# Patient Record
Sex: Female | Born: 1940 | Race: Black or African American | Hispanic: No | Smoking: Never smoker
Health system: Southern US, Community
[De-identification: ages and names within clinical notes are randomized; demographics above are authoritative.]

## PROBLEM LIST (undated history)

## (undated) DIAGNOSIS — F419 Anxiety disorder, unspecified: Secondary | ICD-10-CM

## (undated) DIAGNOSIS — F32A Depression, unspecified: Secondary | ICD-10-CM

## (undated) DIAGNOSIS — I10 Essential (primary) hypertension: Secondary | ICD-10-CM

## (undated) DIAGNOSIS — F329 Major depressive disorder, single episode, unspecified: Secondary | ICD-10-CM

## (undated) DIAGNOSIS — K5900 Constipation, unspecified: Secondary | ICD-10-CM

## (undated) DIAGNOSIS — E119 Type 2 diabetes mellitus without complications: Secondary | ICD-10-CM

## (undated) DIAGNOSIS — H9319 Tinnitus, unspecified ear: Secondary | ICD-10-CM

## (undated) DIAGNOSIS — M25569 Pain in unspecified knee: Secondary | ICD-10-CM

## (undated) HISTORY — PX: OTHER SURGICAL HISTORY: SHX169

## (undated) HISTORY — DX: Major depressive disorder, single episode, unspecified: F32.9

## (undated) HISTORY — DX: Constipation, unspecified: K59.00

## (undated) HISTORY — DX: Tinnitus, unspecified ear: H93.19

## (undated) HISTORY — PX: ABDOMINAL HYSTERECTOMY: SHX81

## (undated) HISTORY — DX: Depression, unspecified: F32.A

---
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
## 2006-10-25 ENCOUNTER — Ambulatory Visit: Payer: Self-pay | Admitting: Internal Medicine

## 2006-11-10 ENCOUNTER — Ambulatory Visit (HOSPITAL_COMMUNITY): Admission: RE | Admit: 2006-11-10 | Discharge: 2006-11-10 | Payer: Self-pay | Admitting: Internal Medicine

## 2007-01-09 ENCOUNTER — Ambulatory Visit: Payer: Self-pay | Admitting: Family Medicine

## 2007-01-27 DIAGNOSIS — R7309 Other abnormal glucose: Secondary | ICD-10-CM | POA: Insufficient documentation

## 2007-02-01 ENCOUNTER — Ambulatory Visit: Payer: Self-pay | Admitting: Internal Medicine

## 2007-02-09 ENCOUNTER — Ambulatory Visit: Payer: Self-pay | Admitting: Internal Medicine

## 2007-02-14 ENCOUNTER — Ambulatory Visit (HOSPITAL_COMMUNITY): Admission: RE | Admit: 2007-02-14 | Discharge: 2007-02-14 | Payer: Self-pay | Admitting: Internal Medicine

## 2007-03-06 ENCOUNTER — Ambulatory Visit: Payer: Self-pay | Admitting: Internal Medicine

## 2007-03-14 ENCOUNTER — Ambulatory Visit: Payer: Self-pay | Admitting: Internal Medicine

## 2007-04-17 ENCOUNTER — Ambulatory Visit: Payer: Self-pay | Admitting: Family Medicine

## 2007-11-12 ENCOUNTER — Emergency Department (HOSPITAL_COMMUNITY): Admission: EM | Admit: 2007-11-12 | Discharge: 2007-11-12 | Payer: Self-pay | Admitting: Family Medicine

## 2007-12-16 ENCOUNTER — Emergency Department (HOSPITAL_COMMUNITY): Admission: EM | Admit: 2007-12-16 | Discharge: 2007-12-16 | Payer: Self-pay | Admitting: Emergency Medicine

## 2008-01-22 ENCOUNTER — Ambulatory Visit: Payer: Self-pay | Admitting: Family Medicine

## 2008-01-22 DIAGNOSIS — M545 Low back pain: Secondary | ICD-10-CM

## 2008-01-24 DIAGNOSIS — I10 Essential (primary) hypertension: Secondary | ICD-10-CM | POA: Insufficient documentation

## 2008-02-10 ENCOUNTER — Emergency Department (HOSPITAL_COMMUNITY): Admission: EM | Admit: 2008-02-10 | Discharge: 2008-02-10 | Payer: Self-pay | Admitting: Emergency Medicine

## 2008-02-18 ENCOUNTER — Ambulatory Visit (HOSPITAL_COMMUNITY): Admission: RE | Admit: 2008-02-18 | Discharge: 2008-02-18 | Payer: Self-pay | Admitting: Internal Medicine

## 2008-02-21 ENCOUNTER — Encounter (INDEPENDENT_AMBULATORY_CARE_PROVIDER_SITE_OTHER): Payer: Self-pay | Admitting: Internal Medicine

## 2008-03-31 ENCOUNTER — Emergency Department (HOSPITAL_COMMUNITY): Admission: EM | Admit: 2008-03-31 | Discharge: 2008-03-31 | Payer: Self-pay | Admitting: Family Medicine

## 2008-04-16 ENCOUNTER — Ambulatory Visit: Payer: Self-pay | Admitting: Family Medicine

## 2008-04-16 ENCOUNTER — Telehealth (INDEPENDENT_AMBULATORY_CARE_PROVIDER_SITE_OTHER): Payer: Self-pay | Admitting: *Deleted

## 2008-04-16 DIAGNOSIS — R5381 Other malaise: Secondary | ICD-10-CM | POA: Insufficient documentation

## 2008-04-16 DIAGNOSIS — R5383 Other fatigue: Secondary | ICD-10-CM

## 2008-06-06 ENCOUNTER — Ambulatory Visit: Payer: Self-pay | Admitting: Internal Medicine

## 2008-06-20 ENCOUNTER — Ambulatory Visit: Payer: Self-pay | Admitting: Internal Medicine

## 2008-06-20 LAB — CONVERTED CEMR LAB
CO2: 25 meq/L (ref 19–32)
Sodium: 138 meq/L (ref 135–145)

## 2008-07-17 ENCOUNTER — Ambulatory Visit: Payer: Self-pay | Admitting: Internal Medicine

## 2008-07-17 ENCOUNTER — Ambulatory Visit: Payer: Self-pay | Admitting: *Deleted

## 2008-07-17 DIAGNOSIS — G4700 Insomnia, unspecified: Secondary | ICD-10-CM

## 2008-07-17 LAB — CONVERTED CEMR LAB
Bilirubin Urine: NEGATIVE
Blood in Urine, dipstick: NEGATIVE
Nitrite: NEGATIVE
Urobilinogen, UA: 0.2
WBC Urine, dipstick: NEGATIVE

## 2008-07-24 ENCOUNTER — Ambulatory Visit: Payer: Self-pay | Admitting: Internal Medicine

## 2008-07-24 LAB — CONVERTED CEMR LAB
ALT: 23 units/L (ref 0–35)
Albumin: 4.3 g/dL (ref 3.5–5.2)
Alkaline Phosphatase: 87 units/L (ref 39–117)
BUN: 14 mg/dL (ref 6–23)
Basophils Absolute: 0 10*3/uL (ref 0.0–0.1)
Basophils Relative: 1 % (ref 0–1)
CO2: 27 meq/L (ref 19–32)
Calcium: 9.5 mg/dL (ref 8.4–10.5)
Cholesterol: 193 mg/dL (ref 0–200)
Eosinophils Absolute: 0.2 10*3/uL (ref 0.0–0.7)
HCT: 41.6 % (ref 36.0–46.0)
HDL: 51 mg/dL (ref >39)
Hemoglobin: 13 g/dL (ref 12.0–15.0)
LDL Cholesterol: 127 mg/dL — ABNORMAL HIGH (ref 0–99)
Lymphocytes Relative: 31 % (ref 12–46)
Lymphs Abs: 1.9 10*3/uL (ref 0.7–4.0)
MCHC: 31.3 g/dL (ref 30.0–36.0)
Monocytes Relative: 6 % (ref 3–12)
Platelets: 279 10*3/uL (ref 150–400)
RDW: 13.8 % (ref 11.5–15.5)
Sodium: 139 meq/L (ref 135–145)
Total Bilirubin: 0.8 mg/dL (ref 0.3–1.2)
Total CHOL/HDL Ratio: 3.8
Triglycerides: 74 mg/dL (ref <150)
WBC: 6.2 10*3/uL (ref 4.0–10.5)

## 2008-08-04 ENCOUNTER — Emergency Department (HOSPITAL_COMMUNITY): Admission: EM | Admit: 2008-08-04 | Discharge: 2008-08-04 | Payer: Self-pay | Admitting: Family Medicine

## 2008-08-06 ENCOUNTER — Encounter (INDEPENDENT_AMBULATORY_CARE_PROVIDER_SITE_OTHER): Payer: Self-pay | Admitting: Internal Medicine

## 2008-08-17 ENCOUNTER — Emergency Department (HOSPITAL_COMMUNITY): Admission: EM | Admit: 2008-08-17 | Discharge: 2008-08-17 | Payer: Self-pay | Admitting: Family Medicine

## 2008-10-05 ENCOUNTER — Emergency Department (HOSPITAL_COMMUNITY): Admission: EM | Admit: 2008-10-05 | Discharge: 2008-10-05 | Payer: Self-pay | Admitting: Emergency Medicine

## 2008-12-15 ENCOUNTER — Telehealth (INDEPENDENT_AMBULATORY_CARE_PROVIDER_SITE_OTHER): Payer: Self-pay | Admitting: Internal Medicine

## 2009-02-13 ENCOUNTER — Ambulatory Visit: Payer: Self-pay | Admitting: Nurse Practitioner

## 2009-02-13 DIAGNOSIS — R05 Cough: Secondary | ICD-10-CM

## 2009-02-13 DIAGNOSIS — G8929 Other chronic pain: Secondary | ICD-10-CM | POA: Insufficient documentation

## 2009-02-13 DIAGNOSIS — M25569 Pain in unspecified knee: Secondary | ICD-10-CM

## 2009-02-13 DIAGNOSIS — R059 Cough, unspecified: Secondary | ICD-10-CM | POA: Insufficient documentation

## 2009-02-13 HISTORY — DX: Pain in unspecified knee: M25.569

## 2009-02-16 LAB — CONVERTED CEMR LAB
Albumin: 4 g/dL (ref 3.5–5.2)
Basophils Absolute: 0 10*3/uL (ref 0.0–0.1)
CO2: 23 meq/L (ref 19–32)
Creatinine, Ser: 0.77 mg/dL (ref 0.40–1.20)
MCHC: 32.1 g/dL (ref 30.0–36.0)
MCV: 86.1 fL (ref 78.0–100.0)
Monocytes Absolute: 1.1 10*3/uL — ABNORMAL HIGH (ref 0.1–1.0)
Neutro Abs: 9.5 10*3/uL — ABNORMAL HIGH (ref 1.7–7.7)
Neutrophils Relative %: 73 % (ref 43–77)
Platelets: 277 10*3/uL (ref 150–400)
Potassium: 4.7 meq/L (ref 3.5–5.3)
RBC: 4.45 M/uL (ref 3.87–5.11)
RDW: 13.7 % (ref 11.5–15.5)
Total Bilirubin: 0.6 mg/dL (ref 0.3–1.2)
Total Protein: 7.4 g/dL (ref 6.0–8.3)

## 2009-03-20 ENCOUNTER — Ambulatory Visit (HOSPITAL_COMMUNITY): Admission: RE | Admit: 2009-03-20 | Discharge: 2009-03-20 | Payer: Self-pay | Admitting: Internal Medicine

## 2009-05-18 ENCOUNTER — Ambulatory Visit: Payer: Self-pay | Admitting: Family Medicine

## 2009-08-11 ENCOUNTER — Telehealth (INDEPENDENT_AMBULATORY_CARE_PROVIDER_SITE_OTHER): Payer: Self-pay | Admitting: *Deleted

## 2009-08-11 DIAGNOSIS — J309 Allergic rhinitis, unspecified: Secondary | ICD-10-CM | POA: Insufficient documentation

## 2009-08-17 ENCOUNTER — Emergency Department (HOSPITAL_COMMUNITY): Admission: EM | Admit: 2009-08-17 | Discharge: 2009-08-17 | Payer: Self-pay | Admitting: Family Medicine

## 2009-11-10 ENCOUNTER — Emergency Department (HOSPITAL_COMMUNITY): Admission: EM | Admit: 2009-11-10 | Discharge: 2009-11-10 | Payer: Self-pay | Admitting: Emergency Medicine

## 2009-11-18 ENCOUNTER — Ambulatory Visit: Payer: Self-pay | Admitting: Internal Medicine

## 2010-03-22 ENCOUNTER — Ambulatory Visit (HOSPITAL_COMMUNITY): Admission: RE | Admit: 2010-03-22 | Discharge: 2010-03-22 | Payer: Self-pay | Admitting: Internal Medicine

## 2010-06-28 ENCOUNTER — Emergency Department (HOSPITAL_COMMUNITY): Admission: EM | Admit: 2010-06-28 | Discharge: 2010-06-28 | Payer: Self-pay | Admitting: Emergency Medicine

## 2010-11-27 ENCOUNTER — Emergency Department (HOSPITAL_COMMUNITY)
Admission: EM | Admit: 2010-11-27 | Discharge: 2010-11-27 | Payer: Self-pay | Source: Home / Self Care | Admitting: Family Medicine

## 2010-12-19 ENCOUNTER — Encounter: Payer: Self-pay | Admitting: Internal Medicine

## 2011-02-04 ENCOUNTER — Emergency Department (HOSPITAL_COMMUNITY)
Admission: EM | Admit: 2011-02-04 | Discharge: 2011-02-04 | Disposition: A | Discharge status: Home / Self Care | Payer: Medicare Other | Attending: Emergency Medicine | Admitting: Emergency Medicine

## 2011-02-04 ENCOUNTER — Emergency Department (HOSPITAL_COMMUNITY): Payer: Medicare Other

## 2011-02-04 DIAGNOSIS — IMO0001 Reserved for inherently not codable concepts without codable children: Secondary | ICD-10-CM | POA: Insufficient documentation

## 2011-02-04 DIAGNOSIS — M2550 Pain in unspecified joint: Secondary | ICD-10-CM | POA: Insufficient documentation

## 2011-02-04 DIAGNOSIS — R5381 Other malaise: Secondary | ICD-10-CM | POA: Insufficient documentation

## 2011-02-04 DIAGNOSIS — R059 Cough, unspecified: Secondary | ICD-10-CM | POA: Insufficient documentation

## 2011-02-04 DIAGNOSIS — R05 Cough: Secondary | ICD-10-CM | POA: Insufficient documentation

## 2011-02-04 DIAGNOSIS — Z7982 Long term (current) use of aspirin: Secondary | ICD-10-CM | POA: Insufficient documentation

## 2011-02-04 DIAGNOSIS — M542 Cervicalgia: Secondary | ICD-10-CM | POA: Insufficient documentation

## 2011-02-04 DIAGNOSIS — Z79899 Other long term (current) drug therapy: Secondary | ICD-10-CM | POA: Insufficient documentation

## 2011-02-04 DIAGNOSIS — I10 Essential (primary) hypertension: Secondary | ICD-10-CM | POA: Insufficient documentation

## 2011-02-04 DIAGNOSIS — M549 Dorsalgia, unspecified: Secondary | ICD-10-CM | POA: Insufficient documentation

## 2011-02-04 DIAGNOSIS — G8929 Other chronic pain: Secondary | ICD-10-CM | POA: Insufficient documentation

## 2011-02-04 DIAGNOSIS — R079 Chest pain, unspecified: Secondary | ICD-10-CM | POA: Insufficient documentation

## 2011-02-04 LAB — URINALYSIS, ROUTINE W REFLEX MICROSCOPIC
Bilirubin Urine: NEGATIVE
Ketones, ur: NEGATIVE mg/dL
Nitrite: NEGATIVE
Protein, ur: NEGATIVE mg/dL
Urobilinogen, UA: 0.2 mg/dL (ref 0.0–1.0)

## 2011-02-04 LAB — CBC
HCT: 41 % (ref 36.0–46.0)
MCH: 27.7 pg (ref 26.0–34.0)
MCV: 84.7 fL (ref 78.0–100.0)
RDW: 13.1 % (ref 11.5–15.5)
WBC: 9.5 10*3/uL (ref 4.0–10.5)

## 2011-02-04 LAB — BASIC METABOLIC PANEL
BUN: 17 mg/dL (ref 6–23)
Calcium: 8.9 mg/dL (ref 8.4–10.5)
Creatinine, Ser: 0.72 mg/dL (ref 0.4–1.2)
GFR calc non Af Amer: >60 mL/min (ref >60)
Glucose, Bld: 152 mg/dL — ABNORMAL HIGH (ref 70–99)
Potassium: 4.1 mEq/L (ref 3.5–5.1)
Sodium: 134 mEq/L — ABNORMAL LOW (ref 135–145)

## 2011-02-04 LAB — DIFFERENTIAL
Basophils Absolute: 0 10*3/uL (ref 0.0–0.1)
Eosinophils Relative: 2 % (ref 0–5)
Lymphocytes Relative: 21 % (ref 12–46)
Lymphs Abs: 2 10*3/uL (ref 0.7–4.0)
Neutrophils Relative %: 71 % (ref 43–77)

## 2011-02-04 LAB — URINE MICROSCOPIC-ADD ON

## 2011-02-11 LAB — COMPREHENSIVE METABOLIC PANEL
ALT: 25 U/L (ref 0–35)
AST: 24 U/L (ref 0–37)
Alkaline Phosphatase: 85 U/L (ref 39–117)
Calcium: 8.8 mg/dL (ref 8.4–10.5)
GFR calc Af Amer: >60 mL/min (ref >60)
GFR calc non Af Amer: >60 mL/min (ref >60)
Potassium: 4.1 mEq/L (ref 3.5–5.1)
Total Bilirubin: 1 mg/dL (ref 0.3–1.2)

## 2011-02-11 LAB — DIFFERENTIAL
Basophils Absolute: 0.1 10*3/uL (ref 0.0–0.1)
Eosinophils Relative: 4 % (ref 0–5)
Lymphocytes Relative: 30 % (ref 12–46)
Neutrophils Relative %: 59 % (ref 43–77)

## 2011-02-11 LAB — CBC
MCH: 28 pg (ref 26.0–34.0)
MCHC: 32.6 g/dL (ref 30.0–36.0)
Platelets: 274 10*3/uL (ref 150–400)
RDW: 13.2 % (ref 11.5–15.5)

## 2011-03-04 ENCOUNTER — Other Ambulatory Visit (HOSPITAL_COMMUNITY): Payer: Self-pay | Admitting: Internal Medicine

## 2011-04-05 ENCOUNTER — Other Ambulatory Visit (HOSPITAL_COMMUNITY): Payer: Self-pay | Admitting: Internal Medicine

## 2011-04-05 DIAGNOSIS — Z1231 Encounter for screening mammogram for malignant neoplasm of breast: Secondary | ICD-10-CM

## 2011-04-13 ENCOUNTER — Ambulatory Visit (HOSPITAL_COMMUNITY)
Admission: RE | Admit: 2011-04-13 | Discharge: 2011-04-13 | Disposition: A | Discharge status: Home / Self Care | Payer: Medicare Other | Source: Ambulatory Visit | Attending: Internal Medicine | Admitting: Internal Medicine

## 2011-04-13 DIAGNOSIS — Z1231 Encounter for screening mammogram for malignant neoplasm of breast: Secondary | ICD-10-CM

## 2011-04-17 ENCOUNTER — Emergency Department (HOSPITAL_COMMUNITY): Payer: No Typology Code available for payment source

## 2011-04-17 ENCOUNTER — Emergency Department (HOSPITAL_COMMUNITY)
Admission: EM | Admit: 2011-04-17 | Discharge: 2011-04-17 | Disposition: A | Discharge status: Home / Self Care | Payer: No Typology Code available for payment source | Attending: Emergency Medicine | Admitting: Emergency Medicine

## 2011-04-17 DIAGNOSIS — S139XXA Sprain of joints and ligaments of unspecified parts of neck, initial encounter: Secondary | ICD-10-CM | POA: Insufficient documentation

## 2011-04-17 DIAGNOSIS — G8929 Other chronic pain: Secondary | ICD-10-CM | POA: Insufficient documentation

## 2011-04-17 DIAGNOSIS — M542 Cervicalgia: Secondary | ICD-10-CM | POA: Insufficient documentation

## 2011-04-17 DIAGNOSIS — Z7982 Long term (current) use of aspirin: Secondary | ICD-10-CM | POA: Insufficient documentation

## 2011-04-17 DIAGNOSIS — S0990XA Unspecified injury of head, initial encounter: Secondary | ICD-10-CM | POA: Insufficient documentation

## 2011-04-17 DIAGNOSIS — M549 Dorsalgia, unspecified: Secondary | ICD-10-CM | POA: Insufficient documentation

## 2011-04-17 DIAGNOSIS — I10 Essential (primary) hypertension: Secondary | ICD-10-CM | POA: Insufficient documentation

## 2011-04-17 DIAGNOSIS — Z79899 Other long term (current) drug therapy: Secondary | ICD-10-CM | POA: Insufficient documentation

## 2011-04-17 DIAGNOSIS — R51 Headache: Secondary | ICD-10-CM | POA: Insufficient documentation

## 2011-04-19 ENCOUNTER — Emergency Department (HOSPITAL_COMMUNITY)
Admission: EM | Admit: 2011-04-19 | Discharge: 2011-04-19 | Discharge status: Against Medical Advice | Payer: Medicare Other | Attending: Emergency Medicine | Admitting: Emergency Medicine

## 2011-04-19 DIAGNOSIS — M542 Cervicalgia: Secondary | ICD-10-CM | POA: Insufficient documentation

## 2011-04-19 DIAGNOSIS — R51 Headache: Secondary | ICD-10-CM | POA: Insufficient documentation

## 2011-04-23 ENCOUNTER — Emergency Department (HOSPITAL_COMMUNITY)
Admission: EM | Admit: 2011-04-23 | Discharge: 2011-04-23 | Disposition: A | Discharge status: Home / Self Care | Payer: Medicare Other | Attending: Emergency Medicine | Admitting: Emergency Medicine

## 2011-04-23 DIAGNOSIS — Z79899 Other long term (current) drug therapy: Secondary | ICD-10-CM | POA: Insufficient documentation

## 2011-04-23 DIAGNOSIS — M25569 Pain in unspecified knee: Secondary | ICD-10-CM | POA: Insufficient documentation

## 2011-04-23 DIAGNOSIS — R51 Headache: Secondary | ICD-10-CM | POA: Insufficient documentation

## 2011-04-23 DIAGNOSIS — S8990XA Unspecified injury of unspecified lower leg, initial encounter: Secondary | ICD-10-CM | POA: Insufficient documentation

## 2011-04-23 DIAGNOSIS — S8000XA Contusion of unspecified knee, initial encounter: Secondary | ICD-10-CM | POA: Insufficient documentation

## 2011-04-23 DIAGNOSIS — M549 Dorsalgia, unspecified: Secondary | ICD-10-CM | POA: Insufficient documentation

## 2011-04-23 DIAGNOSIS — I10 Essential (primary) hypertension: Secondary | ICD-10-CM | POA: Insufficient documentation

## 2011-04-23 DIAGNOSIS — H6690 Otitis media, unspecified, unspecified ear: Secondary | ICD-10-CM | POA: Insufficient documentation

## 2011-04-23 DIAGNOSIS — Z7982 Long term (current) use of aspirin: Secondary | ICD-10-CM | POA: Insufficient documentation

## 2011-05-11 ENCOUNTER — Emergency Department (HOSPITAL_COMMUNITY)
Admission: EM | Admit: 2011-05-11 | Discharge: 2011-05-11 | Disposition: A | Discharge status: Home / Self Care | Payer: No Typology Code available for payment source | Attending: Emergency Medicine | Admitting: Emergency Medicine

## 2011-05-11 ENCOUNTER — Emergency Department (HOSPITAL_COMMUNITY): Payer: No Typology Code available for payment source

## 2011-05-11 DIAGNOSIS — Z79899 Other long term (current) drug therapy: Secondary | ICD-10-CM | POA: Insufficient documentation

## 2011-05-11 DIAGNOSIS — R209 Unspecified disturbances of skin sensation: Secondary | ICD-10-CM | POA: Insufficient documentation

## 2011-05-11 DIAGNOSIS — Z7982 Long term (current) use of aspirin: Secondary | ICD-10-CM | POA: Insufficient documentation

## 2011-05-11 DIAGNOSIS — G8929 Other chronic pain: Secondary | ICD-10-CM | POA: Insufficient documentation

## 2011-05-11 DIAGNOSIS — R51 Headache: Secondary | ICD-10-CM | POA: Insufficient documentation

## 2011-05-11 DIAGNOSIS — M549 Dorsalgia, unspecified: Secondary | ICD-10-CM | POA: Insufficient documentation

## 2011-05-11 DIAGNOSIS — I10 Essential (primary) hypertension: Secondary | ICD-10-CM | POA: Insufficient documentation

## 2011-05-11 DIAGNOSIS — M542 Cervicalgia: Secondary | ICD-10-CM | POA: Insufficient documentation

## 2011-06-09 ENCOUNTER — Emergency Department (HOSPITAL_COMMUNITY)
Admission: EM | Admit: 2011-06-09 | Discharge: 2011-06-09 | Discharge status: Against Medical Advice | Payer: Medicare Other | Attending: Emergency Medicine | Admitting: Emergency Medicine

## 2011-06-09 DIAGNOSIS — H539 Unspecified visual disturbance: Secondary | ICD-10-CM | POA: Insufficient documentation

## 2011-06-09 DIAGNOSIS — R51 Headache: Secondary | ICD-10-CM | POA: Insufficient documentation

## 2011-06-09 DIAGNOSIS — R509 Fever, unspecified: Secondary | ICD-10-CM | POA: Insufficient documentation

## 2011-08-18 LAB — I-STAT 8, (EC8 V) (CONVERTED LAB)
Glucose, Bld: 139 — ABNORMAL HIGH
Potassium: 3.7
TCO2: 29
pH, Ven: 7.407 — ABNORMAL HIGH

## 2011-08-22 LAB — POCT URINALYSIS DIP (DEVICE)
Bilirubin Urine: NEGATIVE
Ketones, ur: NEGATIVE
pH: 7

## 2011-09-02 LAB — I-STAT 8, (EC8 V) (CONVERTED LAB)
Acid-Base Excess: 2
Chloride: 102
HCT: 48 — ABNORMAL HIGH
Hemoglobin: 16.3 — ABNORMAL HIGH
Operator id: 235561
Potassium: 3.9
Sodium: 137

## 2011-09-02 LAB — POCT I-STAT CREATININE: Operator id: 235561

## 2011-11-12 ENCOUNTER — Emergency Department (HOSPITAL_COMMUNITY)
Admission: EM | Admit: 2011-11-12 | Discharge: 2011-11-12 | Disposition: A | Discharge status: Home / Self Care | Payer: Medicare Other | Attending: Emergency Medicine | Admitting: Emergency Medicine

## 2011-11-12 DIAGNOSIS — Z79899 Other long term (current) drug therapy: Secondary | ICD-10-CM | POA: Insufficient documentation

## 2011-11-12 DIAGNOSIS — H579 Unspecified disorder of eye and adnexa: Secondary | ICD-10-CM | POA: Insufficient documentation

## 2011-11-12 DIAGNOSIS — H5789 Other specified disorders of eye and adnexa: Secondary | ICD-10-CM | POA: Insufficient documentation

## 2011-11-12 DIAGNOSIS — S058X9A Other injuries of unspecified eye and orbit, initial encounter: Secondary | ICD-10-CM | POA: Insufficient documentation

## 2011-11-12 DIAGNOSIS — H11429 Conjunctival edema, unspecified eye: Secondary | ICD-10-CM | POA: Insufficient documentation

## 2011-11-12 DIAGNOSIS — S0502XA Injury of conjunctiva and corneal abrasion without foreign body, left eye, initial encounter: Secondary | ICD-10-CM

## 2011-11-12 DIAGNOSIS — I10 Essential (primary) hypertension: Secondary | ICD-10-CM | POA: Insufficient documentation

## 2011-11-12 DIAGNOSIS — H5710 Ocular pain, unspecified eye: Secondary | ICD-10-CM | POA: Insufficient documentation

## 2011-11-12 DIAGNOSIS — IMO0002 Reserved for concepts with insufficient information to code with codable children: Secondary | ICD-10-CM | POA: Insufficient documentation

## 2011-11-12 DIAGNOSIS — H11419 Vascular abnormalities of conjunctiva, unspecified eye: Secondary | ICD-10-CM | POA: Insufficient documentation

## 2011-11-12 DIAGNOSIS — Z7982 Long term (current) use of aspirin: Secondary | ICD-10-CM | POA: Insufficient documentation

## 2011-11-12 HISTORY — DX: Essential (primary) hypertension: I10

## 2011-11-12 MED ORDER — ERYTHROMYCIN 5 MG/GM OP OINT: TOPICAL_OINTMENT | OPHTHALMIC | Status: AC

## 2011-11-12 MED ORDER — PROPARACAINE HCL 0.5 % OP SOLN
1.0000 [drp] | Freq: Once | OPHTHALMIC | Status: AC
  Administered 2011-11-12: 1 [drp] via OPHTHALMIC
  Filled 2011-11-12 (×3): qty 15

## 2011-11-12 MED ORDER — FLUORESCEIN SODIUM 1 MG OP STRP
1.0000 | ORAL_STRIP | Freq: Once | OPHTHALMIC | Status: AC
  Administered 2011-11-12: 16:00:00 via OPHTHALMIC
  Filled 2011-11-12: qty 1

## 2011-11-12 NOTE — ED Notes (Signed)
Lt. Eye redness, and pain, feels like there is something in it.   Symptoms began yesterday

## 2011-11-12 NOTE — ED Provider Notes (Signed)
History     CSN: [id number] Arrival date & time: 11/12/2011  2:06 PM   First MD Initiated Contact with Patient 11/12/11 1449      Chief Complaint  Patient presents with  . Eye Pain    (Consider location/radiation/quality/duration/timing/severity/associated sxs/prior treatment) Patient is a 70 y.o. female presenting with eye pain. The history is provided by the patient.  Eye Pain This is a new problem. The current episode started in the past 7 days (4 days ago). The problem occurs constantly. The problem has been unchanged. Pertinent negatives include no abdominal pain, chest pain, chills, coughing, fatigue, fever, headaches, nausea, rash or vomiting. The symptoms are aggravated by nothing. She has tried nothing for the symptoms. The treatment provided no relief.  She was cleaning with comment 4 days ago, and feels like some of it splashed into her eye. She rinsed her eye well with water at the time, but has had continued symptoms.   Past Medical History  Diagnosis Date  . Hypertension     Past Surgical History  Procedure Date  . Fribroid surgery     No family history on file.  History  Substance Use Topics  . Smoking status: Never Smoker   . Smokeless tobacco: Not on file  . Alcohol Use: No    OB History    Grav Para Term Preterm Abortions TAB SAB Ect Mult Living                  Review of Systems  Constitutional: Negative for fever, chills and fatigue.  Eyes: Positive for pain, redness and itching. Negative for photophobia and visual disturbance.  Respiratory: Negative for cough and shortness of breath.   Cardiovascular: Negative for chest pain and palpitations.  Gastrointestinal: Negative for nausea, vomiting and abdominal pain.  Musculoskeletal: Negative for back pain.  Skin: Negative for color change and rash.  Neurological: Negative for light-headedness and headaches.  All other systems reviewed and are negative.    Allergies  Aspirin  Home  Medications   Current Outpatient Rx  Name Route Sig Dispense Refill  . ASPIRIN EC 81 MG PO TBEC Oral Take 81 mg by mouth daily.      [name] LISINOPRIL 10 MG PO TABS Oral Take 10 mg by mouth daily.        BP 154/78  Pulse 94  Temp(Src) 98.2 F (36.8 C) (Oral)  Resp 20  Ht 5\' 6"  (1.676 m)  Wt 170 lb (77.111 kg)  BMI 27.44 kg/m2  SpO2 97%  Physical Exam  Nursing note and vitals reviewed. Constitutional: She is oriented to person, place, and time. She appears well-developed and well-nourished.  HENT:  Head: Normocephalic and atraumatic.  Eyes: EOM are normal. Pupils are equal, round, and reactive to light. Left eye exhibits chemosis and exudate (small, yellow). Left eye exhibits no discharge. Left conjunctiva is injected.  Slit lamp exam:      The left eye shows corneal abrasion and fluorescein uptake. The left eye shows no foreign body, no hyphema and no hypopyon.         Bilateral vision, each eye, 20/50. Tono pen = 14.  Cardiovascular: Normal rate, regular rhythm, normal heart sounds and intact distal pulses.   Pulmonary/Chest: Effort normal and breath sounds normal. No respiratory distress.  Abdominal: Soft. She exhibits no distension. There is no tenderness.  Neurological: She is alert and oriented to person, place, and time.  Skin: Skin is warm and dry.  Psychiatric: She has a normal  mood and affect.    ED Course  Procedures (including critical care time)  Labs Reviewed - No data to display No results found.   1. Corneal abrasion, left       MDM  A 70 year old female who states that 4 days ago she was cleaning with comment, and feels like she had some of it splashed into her left eye. She rinsed her eye out with water, however states that since then she has had a sense of irritation in that left eye, as well as some redness. She denies any visual changes, headache, any other complaints. The left eye has some conjunctival irritation, a small amount of clear/yellow  discharge, pupils are equal and reactive to light, pH testing revealed a pH of 8; pH of the right eye is normal. The eye was irrigated well with water, however pH remained at 8. Slit-lamp exam reveals a small corneal defect at approximately the 11:00 position. Bilateral and individual eyes showed vision of 20/50; Tono-Pen revealed pressures of 14.. There were no signs of foreign body remaining. Discussed the patient with ophthalmology, the patient in clinic tomorrow morning. Discussed with the patient treatment, importance of followup, and indications for return. The patient expresses understanding with this plan.         Theotis Burrow, MD 11/12/11 [phone]

## 2011-11-13 NOTE — ED Provider Notes (Signed)
I saw and evaluated the patient, reviewed the resident's note and I agree with the findings and plan.  Cleaner to L eye 4 days ago.  Persistent pain and irritation.  Small area of fluoroscein uptake. Initial pH 8, irrigated on arrival.  Anterior chamber clear. D/w ophtho who will see in clinic in AM.  Glynn Octave, MD 11/13/11 [phone]

## 2011-11-18 ENCOUNTER — Emergency Department (HOSPITAL_COMMUNITY): Payer: Medicare Other

## 2011-11-18 ENCOUNTER — Emergency Department (HOSPITAL_COMMUNITY)
Admission: EM | Admit: 2011-11-18 | Discharge: 2011-11-18 | Disposition: A | Discharge status: Home / Self Care | Payer: Medicare Other | Attending: Emergency Medicine | Admitting: Emergency Medicine

## 2011-11-18 ENCOUNTER — Encounter (HOSPITAL_COMMUNITY): Payer: Self-pay | Admitting: Emergency Medicine

## 2011-11-18 DIAGNOSIS — T46901A Poisoning by unspecified agents primarily affecting the cardiovascular system, accidental (unintentional), initial encounter: Secondary | ICD-10-CM | POA: Insufficient documentation

## 2011-11-18 DIAGNOSIS — T50901A Poisoning by unspecified drugs, medicaments and biological substances, accidental (unintentional), initial encounter: Secondary | ICD-10-CM

## 2011-11-18 DIAGNOSIS — I10 Essential (primary) hypertension: Secondary | ICD-10-CM | POA: Insufficient documentation

## 2011-11-18 DIAGNOSIS — Z7982 Long term (current) use of aspirin: Secondary | ICD-10-CM | POA: Insufficient documentation

## 2011-11-18 DIAGNOSIS — Z79899 Other long term (current) drug therapy: Secondary | ICD-10-CM | POA: Insufficient documentation

## 2011-11-18 DIAGNOSIS — T46904A Poisoning by unspecified agents primarily affecting the cardiovascular system, undetermined, initial encounter: Secondary | ICD-10-CM | POA: Insufficient documentation

## 2011-11-18 LAB — CBC
HCT: 38.7 % (ref 36.0–46.0)
Hemoglobin: 12.5 g/dL (ref 12.0–15.0)
MCH: 27.5 pg (ref 26.0–34.0)
MCHC: 32.3 g/dL (ref 30.0–36.0)
MCV: 85.2 fL (ref 78.0–100.0)
Platelets: 244 10*3/uL (ref 150–400)
RBC: 4.54 MIL/uL (ref 3.87–5.11)
RDW: 13 % (ref 11.5–15.5)
WBC: 6.8 10*3/uL (ref 4.0–10.5)

## 2011-11-18 LAB — ACETAMINOPHEN LEVEL: Acetaminophen (Tylenol), Serum: <15 ug/mL (ref 10–30)

## 2011-11-18 LAB — DIFFERENTIAL
Basophils Absolute: 0 10*3/uL (ref 0.0–0.1)
Basophils Relative: 0 % (ref 0–1)
Eosinophils Absolute: 0.2 10*3/uL (ref 0.0–0.7)
Eosinophils Relative: 4 % (ref 0–5)
Lymphocytes Relative: 26 % (ref 12–46)
Lymphs Abs: 1.8 10*3/uL (ref 0.7–4.0)
Monocytes Absolute: 0.5 10*3/uL (ref 0.1–1.0)
Monocytes Relative: 7 % (ref 3–12)
Neutro Abs: 4.3 10*3/uL (ref 1.7–7.7)
Neutrophils Relative %: 63 % (ref 43–77)

## 2011-11-18 LAB — SALICYLATE LEVEL: Salicylate Lvl: <2 mg/dL — ABNORMAL LOW (ref 2.8–20.0)

## 2011-11-18 LAB — RAPID URINE DRUG SCREEN, HOSP PERFORMED
Amphetamines: NOT DETECTED
Barbiturates: NOT DETECTED

## 2011-11-18 LAB — POCT I-STAT TROPONIN I: Troponin i, poc: 0 ng/mL (ref 0.00–0.08)

## 2011-11-18 LAB — POCT I-STAT, CHEM 8
BUN: 17 mg/dL (ref 6–23)
Creatinine, Ser: 0.8 mg/dL (ref 0.50–1.10)
Hemoglobin: 13.6 g/dL (ref 12.0–15.0)
Potassium: 3.6 mEq/L (ref 3.5–5.1)
Sodium: 139 mEq/L (ref 135–145)

## 2011-11-18 NOTE — ED Notes (Signed)
Pt stated that she accidentally took her husbands 20mg  Lisinopril PO instead of her 10mg  Lisinopril PO. She is fully Alert and Oriented. Pt states that she does not feel lightheaded. Will continue to monitor.

## 2011-11-18 NOTE — ED Notes (Signed)
Pt given warm blankets and apple juice.

## 2011-11-18 NOTE — ED Provider Notes (Signed)
History     CSN: [id number]  Arrival date & time 11/18/11  0037   First MD Initiated Contact with Patient 11/18/11 0201      No chief complaint on file.   (Consider location/radiation/quality/duration/timing/severity/associated sxs/prior treatment) Patient is a 70 y.o. female presenting with Overdose. The history is provided by the patient. No language interpreter was used.  Drug Overdose This is a new problem. The current episode started 3 to 5 hours ago. The problem occurs constantly. The problem has not changed since onset.Pertinent negatives include no chest pain, no abdominal pain, no headaches and no shortness of breath. The symptoms are aggravated by nothing. The symptoms are relieved by nothing. She has tried nothing for the symptoms. The treatment provided no relief.  Patient states that both she and he husband take lisinopril he takes 20 and she takes ten mg  Past Medical History  Diagnosis Date  . Hypertension     Past Surgical History  Procedure Date  . Fribroid surgery     No family history on file.  History  Substance Use Topics  . Smoking status: Never Smoker   . Smokeless tobacco: Not on file  . Alcohol Use: No    OB History    Grav Para Term Preterm Abortions TAB SAB Ect Mult Living                  Review of Systems  Constitutional: Negative for activity change.  HENT: Negative for facial swelling.   Eyes: Negative for discharge.  Respiratory: Negative for shortness of breath.   Cardiovascular: Negative for chest pain.  Gastrointestinal: Negative for abdominal pain and abdominal distention.  Genitourinary: Negative for difficulty urinating.  Musculoskeletal: Negative for arthralgias.  Skin: Negative.   Neurological: Negative for headaches.  Hematological: Negative.   Psychiatric/Behavioral: Negative.  Negative for hallucinations, confusion, dysphoric mood and agitation. The patient is not nervous/anxious and is not hyperactive.      Allergies  Aspirin  Home Medications   Current Outpatient Rx  Name Route Sig Dispense Refill  . ASPIRIN EC 81 MG PO TBEC Oral Take 81 mg by mouth daily.      . ERYTHROMYCIN 5 MG/GM OP OINT  Place a 1/2 inch ribbon of ointment into the lower eyelid four times a day for 5 days. It may make your vision blurry. 3.5 g 0  . LISINOPRIL 10 MG PO TABS Oral Take 10 mg by mouth daily.        BP 121/66  Pulse 75  Temp(Src) 98.1 F (36.7 C) (Oral)  Resp 18  SpO2 98%  Physical Exam  Nursing note and vitals reviewed. Constitutional: She is oriented to person, place, and time. She appears well-developed and well-nourished.  HENT:  Head: Normocephalic.  Mouth/Throat: Oropharynx is clear and moist.  Eyes: EOM are normal. Pupils are equal, round, and reactive to light.  Neck: Normal range of motion. Neck supple.  Cardiovascular: Normal rate, regular rhythm and intact distal pulses.   Pulmonary/Chest: Effort normal and breath sounds normal.  Abdominal: Soft. Bowel sounds are normal. There is no tenderness. There is no rebound and no guarding.  Musculoskeletal: Normal range of motion.  Lymphadenopathy:    She has no cervical adenopathy.  Neurological: She is alert and oriented to person, place, and time.  Skin: Skin is warm and dry.  Psychiatric: She has a normal mood and affect. Her behavior is normal. Judgment and thought content normal.    ED Course  Procedures (including critical  care time)  Labs Reviewed  POCT I-STAT, CHEM 8 - Abnormal; Notable for the following:    Glucose, Bld 122 (*)    All other components within normal limits  CBC  DIFFERENTIAL  POCT I-STAT TROPONIN I  I-STAT TROPONIN I  I-STAT, CHEM 8  ACETAMINOPHEN LEVEL  SALICYLATE LEVEL  URINE RAPID DRUG SCREEN (HOSP PERFORMED)   Dg Chest 2 View  11/18/2011  *RADIOLOGY REPORT*  Clinical Data: Overdose  CHEST - 2 VIEW  Comparison: 02/04/2011  Findings: Heart size upper normal limits to mildly enlarged.  No focal  consolidation or definite pleural effusion.  No pneumothorax. Eventration right hemidiaphragm. No acute osseous abnormality. Sclerotic focus projecting over the scapula is nonspecific, not seen previously, therefore likely external.  IMPRESSION: No acute process identified.  Original Report Authenticated By: Waneta Martins, M.D.     No diagnosis found.    MDM   Date: 11/18/2011  Rate: 79  Rhythm: normal sinus rhythm  QRS Axis: normal  Intervals: normal  ST/T Wave abnormalities: normal  Conduction Disutrbances:none  Narrative Interpretation:   Old EKG Reviewed: none available   Return for chest pain shortness of breath nausea vomiting weakness or any concerns.  Ptient verbalizes understanding and agrees to follow up       Smitty Cords, MD 11/18/11 [phone]

## 2011-11-18 NOTE — ED Notes (Signed)
PT. ACCIDENTALLY TOOK 2 TABS OF LISINOPRIL 10 MG THIS EVENING , REPORTS DRY COUGH AND GENERALIZED WEAKNESS , DENIES SUICIDAL IDEATION , STATES HUSBAND IS AT ICU - LOTS OF FAMILY STRESS.

## 2011-12-22 ENCOUNTER — Emergency Department (HOSPITAL_COMMUNITY)
Admission: EM | Admit: 2011-12-22 | Discharge: 2011-12-22 | Disposition: A | Discharge status: Home / Self Care | Payer: Medicare Other | Attending: Emergency Medicine | Admitting: Emergency Medicine

## 2011-12-22 ENCOUNTER — Emergency Department (HOSPITAL_COMMUNITY): Payer: Medicare Other

## 2011-12-22 ENCOUNTER — Encounter (HOSPITAL_COMMUNITY): Payer: Self-pay | Admitting: *Deleted

## 2011-12-22 DIAGNOSIS — Z7982 Long term (current) use of aspirin: Secondary | ICD-10-CM | POA: Insufficient documentation

## 2011-12-22 DIAGNOSIS — R52 Pain, unspecified: Secondary | ICD-10-CM

## 2011-12-22 DIAGNOSIS — I10 Essential (primary) hypertension: Secondary | ICD-10-CM | POA: Insufficient documentation

## 2011-12-22 DIAGNOSIS — R51 Headache: Secondary | ICD-10-CM | POA: Insufficient documentation

## 2011-12-22 DIAGNOSIS — Z79899 Other long term (current) drug therapy: Secondary | ICD-10-CM | POA: Insufficient documentation

## 2011-12-22 LAB — URINALYSIS, ROUTINE W REFLEX MICROSCOPIC
Bilirubin Urine: NEGATIVE
Glucose, UA: NEGATIVE mg/dL
Hgb urine dipstick: NEGATIVE
Ketones, ur: NEGATIVE mg/dL
Nitrite: NEGATIVE
Protein, ur: NEGATIVE mg/dL
Specific Gravity, Urine: 1.007 (ref 1.005–1.030)
Urobilinogen, UA: 0.2 mg/dL (ref 0.0–1.0)
pH: 6 (ref 5.0–8.0)

## 2011-12-22 LAB — POCT I-STAT, CHEM 8
BUN: 25 mg/dL — ABNORMAL HIGH (ref 6–23)
Calcium, Ion: 1.18 mmol/L (ref 1.12–1.32)
Chloride: 105 meq/L (ref 96–112)
Creatinine, Ser: 0.9 mg/dL (ref 0.50–1.10)
Glucose, Bld: 100 mg/dL — ABNORMAL HIGH (ref 70–99)
HCT: 42 % (ref 36.0–46.0)
Hemoglobin: 14.3 g/dL (ref 12.0–15.0)
Potassium: 4.2 meq/L (ref 3.5–5.1)
Sodium: 139 meq/L (ref 135–145)
TCO2: 26 mmol/L (ref 0–100)

## 2011-12-22 LAB — CBC
HCT: 38.9 % (ref 36.0–46.0)
MCHC: 32.6 g/dL (ref 30.0–36.0)
RDW: 13 % (ref 11.5–15.5)
WBC: 6.6 10*3/uL (ref 4.0–10.5)

## 2011-12-22 LAB — URINE MICROSCOPIC-ADD ON

## 2011-12-22 MED ORDER — ACETAMINOPHEN 325 MG PO TABS
650.0000 mg | ORAL_TABLET | Freq: Once | ORAL | Status: AC
  Administered 2011-12-22: 650 mg via ORAL
  Filled 2011-12-22: qty 2

## 2011-12-22 MED ORDER — SODIUM CHLORIDE 0.9 % IV BOLUS (SEPSIS)
1000.0000 mL | Freq: Once | INTRAVENOUS | Status: AC
  Administered 2011-12-22: 1000 mL via INTRAVENOUS

## 2011-12-22 NOTE — ED Provider Notes (Signed)
History     CSN: [id number]  Arrival date & time 12/22/11  0103   First MD Initiated Contact with Patient 12/22/11 0201      Chief Complaint  Patient presents with  . Generalized Body Aches    (Consider location/radiation/quality/duration/timing/severity/associated sxs/prior treatment) HPI Onset this morning with a mild headache and bodyaches all over. She took some Tylenol which helps some but symptoms have returned. Patient has been sleeping at the hospital for the last month. Her husband was admitted to the ICU and is now in the long-term care facility. She has been staying here with him and only going home to bath. She says he sleeps ear and wakes up about 20 times a night helping him when he wakes up and needs something. She admits to getting very little sleep. She denies any chest pain, shortness of breath, fevers, nausea, vomiting or diarrhea. She denies any cough or recent illness otherwise. She states she takes her medications as prescribed but has not been taking care of her self otherwise. No specific pain and no chills or noted fevers. Quality described as "aches". No rashes. No history of same.   Past Medical History  Diagnosis Date  . Hypertension     Past Surgical History  Procedure Date  . Fribroid surgery     History reviewed. No pertinent family history.  History  Substance Use Topics  . Smoking status: Never Smoker   . Smokeless tobacco: Not on file  . Alcohol Use: No    OB History    Grav Para Term Preterm Abortions TAB SAB Ect Mult Living                  Review of Systems  Constitutional: Negative for fever, chills and diaphoresis.  HENT: Negative for trouble swallowing, neck pain and neck stiffness.   Eyes: Negative for pain.  Respiratory: Negative for shortness of breath.   Cardiovascular: Negative for chest pain, palpitations and leg swelling.  Gastrointestinal: Negative for vomiting and abdominal pain.  Genitourinary: Negative for dysuria.    Musculoskeletal: Negative for back pain.  Skin: Negative for rash.  Neurological: Negative for headaches.  All other systems reviewed and are negative.    Allergies  Aspirin  Home Medications   Current Outpatient Rx  Name Route Sig Dispense Refill  . ACETAMINOPHEN 500 MG PO TABS Oral Take 1,000 mg by mouth every 6 (six) hours as needed.    . ASPIRIN EC 81 MG PO TBEC Oral Take 81 mg by mouth daily.      [name] LISINOPRIL 10 MG PO TABS Oral Take 10 mg by mouth daily.        BP 131/86  Pulse 78  Temp(Src) 97.8 F (36.6 C) (Oral)  Resp 20  SpO2 98%  Physical Exam  Constitutional: She is oriented to person, place, and time. She appears well-developed and well-nourished.  HENT:  Head: Normocephalic and atraumatic.  Eyes: Conjunctivae and EOM are normal. Pupils are equal, round, and reactive to light.  Neck: Trachea normal. Neck supple. No thyromegaly present.  Cardiovascular: Normal rate, regular rhythm, S1 normal, S2 normal and normal pulses.     No systolic murmur is present   No diastolic murmur is present  Pulses:      Radial pulses are 2+ on the right side, and 2+ on the left side.  Pulmonary/Chest: Effort normal and breath sounds normal. She has no wheezes. She has no rhonchi. She has no rales. She exhibits no tenderness.  Abdominal: Soft. Normal appearance and bowel sounds are normal. There is no tenderness. There is no CVA tenderness and negative Murphy's sign.  Musculoskeletal:       BLE:s Calves nontender, no cords or erythema, negative Homans sign  Neurological: She is alert and oriented to person, place, and time. She has normal strength. No cranial nerve deficit or sensory deficit. GCS eye subscore is 4. GCS verbal subscore is 5. GCS motor subscore is 6.  Skin: Skin is warm and dry. No rash noted. She is not diaphoretic.  Psychiatric: Her speech is normal.       Cooperative and appropriate    ED Course  Procedures (including critical care time)  Labs Reviewed   URINALYSIS, ROUTINE W REFLEX MICROSCOPIC - Abnormal; Notable for the following:    Leukocytes, UA SMALL (*)    All other components within normal limits  POCT I-STAT, CHEM 8 - Abnormal; Notable for the following:    BUN 25 (*)    Glucose, Bld 100 (*)    All other components within normal limits  CBC  URINE MICROSCOPIC-ADD ON  I-STAT, CHEM 8   Dg Chest 2 View  12/22/2011  *RADIOLOGY REPORT*  Clinical Data: Diffuse body aches.  CHEST - 2 VIEW  Comparison: Chest radiograph performed 11/18/2011  Findings: The lungs are well-aerated and clear.  There is no evidence of focal opacification, pleural effusion or pneumothorax. Pulmonary vascularity is at the upper limits of normal.  The heart is borderline normal in size; the mediastinal contour is within normal limits.  No acute osseous abnormalities are seen.  IMPRESSION: No acute cardiopulmonary process seen.  Original Report Authenticated By: Tonia Ghent, M.D.    IV fluids. Labs and x-ray and UA. Patient ambulates to the bathroom no acute distress. No deficits. Afebrile with vital signs within normal limits  I had a long discussion with patient regarding her need to care for herself with adequate sleep and nutrition. She states understanding this.    MDM   Body aches mild headache likely due to very poor sleep schedule and patient not caring for herself the way she should. She has no symptoms of ACS or presentation suggests the same. There is no clinical evidence of infectious process. Patient agrees to all discharge and followup instructions and will try going home at nighttime to sleep otherwise staying in the hospital with her husband. She agrees to drink plenty of fluids and eat 3 square meals a day. She admits to stress but denies any depression or suicidal ideation. She has family in the area in good social support as needed. No indication for further workup or admission at this time.        Sunnie Nielsen, MD 12/22/11 [phone]

## 2011-12-22 NOTE — ED Notes (Signed)
Pt. C/o "Pain all over",  Right upper tooth pain., denies N/V.

## 2011-12-22 NOTE — ED Notes (Signed)
Dc instructions given and understanding verbalized 

## 2011-12-22 NOTE — ED Notes (Signed)
Pt. sitting upright in bed, vitals taken, awaiting RN.

## 2011-12-22 NOTE — ED Notes (Signed)
Pt reports having generalized body pain that has lasted since this am.  Denies fever, chills.  Pt also reporting upper tooth pain.

## 2012-02-13 ENCOUNTER — Emergency Department (HOSPITAL_COMMUNITY)
Admission: EM | Admit: 2012-02-13 | Discharge: 2012-02-13 | Disposition: A | Discharge status: Home / Self Care | Payer: Medicare Other | Attending: Emergency Medicine | Admitting: Emergency Medicine

## 2012-02-13 ENCOUNTER — Encounter (HOSPITAL_COMMUNITY): Payer: Self-pay

## 2012-02-13 DIAGNOSIS — Z886 Allergy status to analgesic agent status: Secondary | ICD-10-CM | POA: Insufficient documentation

## 2012-02-13 DIAGNOSIS — I10 Essential (primary) hypertension: Secondary | ICD-10-CM | POA: Insufficient documentation

## 2012-02-13 DIAGNOSIS — M79672 Pain in left foot: Secondary | ICD-10-CM

## 2012-02-13 DIAGNOSIS — M79609 Pain in unspecified limb: Secondary | ICD-10-CM | POA: Insufficient documentation

## 2012-02-13 DIAGNOSIS — R51 Headache: Secondary | ICD-10-CM

## 2012-02-13 LAB — POCT I-STAT, CHEM 8
Calcium, Ion: 1.23 mmol/L (ref 1.12–1.32)
Chloride: 105 mEq/L (ref 96–112)
HCT: 45 % (ref 36.0–46.0)
Sodium: 142 mEq/L (ref 135–145)
TCO2: 26 mmol/L (ref 0–100)

## 2012-02-13 MED ORDER — NAPROXEN 500 MG PO TABS: 500.0000 mg | ORAL_TABLET | Freq: Two times a day (BID) | ORAL | Status: DC

## 2012-02-13 NOTE — ED Provider Notes (Signed)
History     CSN: [id number]  Arrival date & time 02/13/12  1000   First MD Initiated Contact with Patient 02/13/12 1156      Chief Complaint  Patient presents with  . Headache  . Extremity Pain    (Consider location/radiation/quality/duration/timing/severity/associated sxs/prior treatment) Patient is a 71 y.o. female presenting with headaches and extremity pain. The history is provided by the patient. No language interpreter was used.  Headache  This is a new problem. The current episode started yesterday. The problem occurs every few hours. The problem has been gradually improving. The pain is located in the occipital region. The quality of the pain is described as dull and throbbing. The pain is moderate. The pain does not radiate. Pertinent negatives include no fever, no syncope, no shortness of breath, no nausea and no vomiting. She has tried nothing for the symptoms.  Extremity Pain Associated symptoms include arthralgias and headaches. Pertinent negatives include no chest pain, fever, nausea or vomiting.  Patient reports bilateral foot pain, ongoing intermittently for several weeks.  Patient notices increased swelling during day, improves at night.  No calf pain.  No shortness of breath or chest pain.  Past Medical History  Diagnosis Date  . Hypertension     Past Surgical History  Procedure Date  . Fribroid surgery     History reviewed. No pertinent family history.  History  Substance Use Topics  . Smoking status: Never Smoker   . Smokeless tobacco: Not on file  . Alcohol Use: No    OB History    Grav Para Term Preterm Abortions TAB SAB Ect Mult Living                  Review of Systems  Constitutional: Negative for fever.  Respiratory: Negative for shortness of breath.   Cardiovascular: Negative for chest pain and syncope.  Gastrointestinal: Negative for nausea and vomiting.  Musculoskeletal: Positive for arthralgias.  Neurological: Positive for dizziness  and headaches.  All other systems reviewed and are negative.    Allergies  Aspirin  Home Medications   Current Outpatient Rx  Name Route Sig Dispense Refill  . ASPIRIN EC 81 MG PO TBEC Oral Take 81 mg by mouth daily.      [name] LISINOPRIL 10 MG PO TABS Oral Take 10 mg by mouth daily.        BP 137/81  Pulse 73  Temp 97.7 F (36.5 C)  Resp 16  SpO2 98%  Physical Exam  Nursing note and vitals reviewed. Constitutional: She is oriented to person, place, and time. She appears well-developed and well-nourished.  HENT:  Head: Normocephalic.  Eyes: Conjunctivae are normal. Pupils are equal, round, and reactive to light.  Neck: Normal range of motion. Neck supple.  Cardiovascular: Normal rate, regular rhythm, normal heart sounds and intact distal pulses.   Pulmonary/Chest: Effort normal and breath sounds normal.  Abdominal: Soft. Bowel sounds are normal.  Musculoskeletal: Normal range of motion. She exhibits edema and tenderness.  Neurological: She is alert and oriented to person, place, and time. No cranial nerve deficit. Coordination normal.  Skin: Skin is warm and dry.  Psychiatric: She has a normal mood and affect. Her behavior is normal. Judgment and thought content normal.    ED Course  Procedures (including critical care time)  Labs Reviewed - No data to display No results found.   No diagnosis found.   Discussed patient with Dr. Doran Heater indication for imaging of head d/t patient afebrile, normal  neurologic exam.   MDM          Jimmye Norman, NP 02/13/12 1347

## 2012-02-13 NOTE — Discharge Instructions (Signed)
Headache, General, Unknown Cause The specific cause of your headache may not have been found today. There are many causes and types of headache. A few common ones are:  Tension headache.   Migraine.   Infections (examples: dental and sinus infections).   Bone and/or joint problems in the neck or jaw.   Depression.   Eye problems.  These headaches are not life threatening.  Headaches can sometimes be diagnosed by a patient history and a physical exam. Sometimes, lab and imaging studies (such as x-ray and/or CT scan) are used to rule out more serious problems. In some cases, a spinal tap (lumbar puncture) may be requested. There are many times when your exam and tests may be normal on the first visit even when there is a serious problem causing your headaches. Because of that, it is very important to follow up with your doctor or local clinic for further evaluation. HOME CARE INSTRUCTIONS   Keep follow-up appointments with your caregiver, or any specialist referral.   Only take over-the-counter or prescription medicines for pain, discomfort, or fever as directed by your caregiver.   Biofeedback, massage, or other relaxation techniques may be helpful.   Ice packs or heat applied to the head and neck can be used. Do this three to four times per day, or as needed.   Call your doctor if you have any questions or concerns.   If you smoke, you should quit.  SEEK MEDICAL CARE IF:   You develop problems with medications prescribed.   You do not respond to or obtain relief from medications.   You have a change from the usual headache.   You develop nausea or vomiting.  SEEK IMMEDIATE MEDICAL CARE IF:   If your headache becomes severe.   You have an unexplained oral temperature above 102 F (38.9 C), or as your caregiver suggests.   You have a stiff neck.   You have loss of vision.   You have muscular weakness.   You have loss of muscular control.   You develop severe  symptoms different from your first symptoms.   You start losing your balance or have trouble walking.   You feel faint or pass out.  MAKE SURE YOU:   Understand these instructions.   Will watch your condition.   Will get help right away if you are not doing well or get worse.  Document Released: 11/14/2005 Document Revised: 11/03/2011 Document Reviewed: 07/03/2008 [facility] Patient Information 2012 [place], [place].  RESOURCE GUIDE  Dental Problems  Patients with Medicaid: [facility] [phone] [address]                                           [phone] Phone:  [phone]                                                  Phone:  [phone]  If unable to pay or uninsured, contact:  [facility] or [facility]. to become qualified for the adult dental clinic.  Chronic Pain Problems Contact [facility]  [phone] Patients need to be referred by their primary care doctor.  Insufficient Money for Medicine Contact United Way:  call "211" or [facility] [phone].  No Primary Care Doctor Call [facility]  [phone] Other agencies that provide inexpensive medical care    [facility]  [phone]    [facility]  [phone]    [facility]  [phone]    [facility]  [phone]    [facility]  [phone]    [facility]  [phone]  Psychological Services [facility] Behavioral [facility]  [phone] [facility]  [phone] [facility] Mental Health   [phone] (emergency services [phone])  Substance Abuse Resources [facility]  [phone] [facility] [phone] [facility] [place] [phone] Floydene Flock [phone] Residential & Outpatient Substance Abuse Program  [phone]  Abuse/Neglect [facility] Child Abuse Hotline 985-091-1015 [facility] Child Abuse Hotline  364-234-1477 (After Hours)  Emergency Shelter [facility] [phone]  [facility] [place] [facility] [phone] [facility] [phone]  MRSA Hotline #:   (225) 044-8801    [facility] Resources  [facility]     [employer]                          [place] [facility]. [address] [place]                       [address]      [address]  Blondell Reveal Phone:  [phone]                                   Phone:  [phone]                 Phone:  [phone]  [facility] Mental Health Phone:  [phone]  [facility] Child Abuse Hotline 260-499-6872 [phone] (After Hours)

## 2012-02-13 NOTE — ED Notes (Signed)
Pt complains of pain in bilateral legs and headache,

## 2012-02-13 NOTE — ED Provider Notes (Signed)
Medical screening examination/treatment/procedure(s) were performed by non-physician practitioner and as supervising physician I was immediately available for consultation/collaboration.  Cheri Guppy, MD 02/13/12 1950

## 2012-02-29 ENCOUNTER — Other Ambulatory Visit: Payer: Self-pay

## 2012-02-29 ENCOUNTER — Emergency Department (HOSPITAL_COMMUNITY): Payer: Medicare Other

## 2012-02-29 ENCOUNTER — Emergency Department (HOSPITAL_COMMUNITY)
Admission: EM | Admit: 2012-02-29 | Discharge: 2012-03-01 | Disposition: A | Discharge status: Home / Self Care | Payer: Medicare Other | Attending: Emergency Medicine | Admitting: Emergency Medicine

## 2012-02-29 ENCOUNTER — Encounter (HOSPITAL_COMMUNITY): Payer: Self-pay

## 2012-02-29 DIAGNOSIS — I10 Essential (primary) hypertension: Secondary | ICD-10-CM | POA: Insufficient documentation

## 2012-02-29 DIAGNOSIS — R109 Unspecified abdominal pain: Secondary | ICD-10-CM

## 2012-02-29 DIAGNOSIS — Z7982 Long term (current) use of aspirin: Secondary | ICD-10-CM | POA: Insufficient documentation

## 2012-02-29 DIAGNOSIS — K5900 Constipation, unspecified: Secondary | ICD-10-CM | POA: Insufficient documentation

## 2012-02-29 LAB — COMPREHENSIVE METABOLIC PANEL
ALT: 31 U/L (ref 0–35)
AST: 27 U/L (ref 0–37)
Alkaline Phosphatase: 107 U/L (ref 39–117)
CO2: 27 mEq/L (ref 19–32)
Chloride: 100 mEq/L (ref 96–112)
GFR calc non Af Amer: 82 mL/min — ABNORMAL LOW (ref >90)
Potassium: 4.1 mEq/L (ref 3.5–5.1)
Sodium: 134 mEq/L — ABNORMAL LOW (ref 135–145)
Total Bilirubin: 0.4 mg/dL (ref 0.3–1.2)

## 2012-02-29 LAB — URINALYSIS, ROUTINE W REFLEX MICROSCOPIC
Bilirubin Urine: NEGATIVE
Glucose, UA: NEGATIVE mg/dL
Ketones, ur: NEGATIVE mg/dL
Leukocytes, UA: NEGATIVE
pH: 8 (ref 5.0–8.0)

## 2012-02-29 LAB — CBC
Platelets: 233 10*3/uL (ref 150–400)
RBC: 4.97 MIL/uL (ref 3.87–5.11)
RDW: 13.1 % (ref 11.5–15.5)
WBC: 8 10*3/uL (ref 4.0–10.5)

## 2012-02-29 LAB — DIFFERENTIAL
Basophils Absolute: 0.1 10*3/uL (ref 0.0–0.1)
Lymphocytes Relative: 29 % (ref 12–46)
Neutro Abs: 4.8 10*3/uL (ref 1.7–7.7)

## 2012-02-29 MED ORDER — POLYETHYLENE GLYCOL 3350 17 G PO PACK: PACK | ORAL | Status: DC

## 2012-02-29 MED ORDER — BISACODYL 5 MG PO TBEC
10.0000 mg | DELAYED_RELEASE_TABLET | Freq: Once | ORAL | Status: AC
  Administered 2012-03-01: 10 mg via ORAL
  Filled 2012-02-29: qty 2

## 2012-02-29 MED ORDER — IOHEXOL 300 MG/ML  SOLN
40.0000 mL | Freq: Once | INTRAMUSCULAR | Status: AC | PRN
  Administered 2012-02-29: 40 mL via ORAL

## 2012-02-29 MED ORDER — IOHEXOL 300 MG/ML  SOLN
100.0000 mL | Freq: Once | INTRAMUSCULAR | Status: AC | PRN
  Administered 2012-02-29: 100 mL via INTRAVENOUS

## 2012-02-29 NOTE — Discharge Instructions (Signed)
Take miralax as directed and may use with over the counter dulcolax for additional constipation relief. Follow up with Primary care provider in the next few days for recheck of ongoing constipation and abdominal pain but return to ER for changing or worsening of symptoms.   Constipation in Adults Constipation is having fewer than 2 bowel movements per week. Usually, the stools are hard. As we grow older, constipation is more common. If you try to fix constipation with laxatives, the problem may get worse. This is because laxatives taken over a long period of time make the colon muscles weaker. A low-fiber diet, not taking in enough fluids, and taking some medicines may make these problems worse. MEDICATIONS THAT MAY CAUSE CONSTIPATION  Water pills (diuretics).   Calcium channel blockers (used to control blood pressure and for the heart).   Certain pain medicines (narcotics).   Anticholinergics.   Anti-inflammatory agents.   Antacids that contain aluminum.  DISEASES THAT CONTRIBUTE TO CONSTIPATION  Diabetes.   Parkinson's disease.   Dementia.   Stroke.   Depression.   Illnesses that cause problems with salt and water metabolism.  HOME CARE INSTRUCTIONS   Constipation is usually best cared for without medicines. Increasing dietary fiber and eating more fruits and vegetables is the best way to manage constipation.   Slowly increase fiber intake to 25 to 38 grams per day. Whole grains, fruits, vegetables, and legumes are good sources of fiber. A dietitian can further help you incorporate high-fiber foods into your diet.   Drink enough water and fluids to keep your urine clear or pale yellow.   A fiber supplement may be added to your diet if you cannot get enough fiber from foods.   Increasing your activities also helps improve regularity.   Suppositories, as suggested by your caregiver, will also help. If you are using antacids, such as aluminum or calcium containing products,  it will be helpful to switch to products containing magnesium if your caregiver says it is okay.   If you have been given a liquid injection (enema) today, this is only a temporary measure. It should not be relied on for treatment of longstanding (chronic) constipation.   Stronger measures, such as magnesium sulfate, should be avoided if possible. This may cause uncontrollable diarrhea. Using magnesium sulfate may not allow you time to make it to the bathroom.  SEEK IMMEDIATE MEDICAL CARE IF:   There is bright red blood in the stool.   The constipation stays for more than 4 days.   There is belly (abdominal) or rectal pain.   You do not seem to be getting better.   You have any questions or concerns.  MAKE SURE YOU:   Understand these instructions.   Will watch your condition.   Will get help right away if you are not doing well or get worse.  Document Released: 08/12/2004 Document Revised: 11/03/2011 Document Reviewed: 10/18/2011 [facility] Patient Information 2012 [place], [place].

## 2012-02-29 NOTE — ED Provider Notes (Signed)
Medical screening examination/treatment/procedure(s) were performed by non-physician practitioner and as supervising physician I was immediately available for consultation/collaboration.   R. , MD 02/29/12 2345 

## 2012-02-29 NOTE — ED Provider Notes (Signed)
History     CSN: [id number]  Arrival date & time 02/29/12  1633   First MD Initiated Contact with Patient 02/29/12 1810      Chief Complaint  Patient presents with  . Constipation  . Abdominal Pain    (Consider location/radiation/quality/duration/timing/severity/associated sxs/prior treatment) HPI  Patient presents to emergency department complaining of a one-week history of gradual onset of diffuse abdominal pain and constipation. Patient states that she was seen in the ER about 1 to 2 weeks ago complaining of leftleg pain and was put on a pain medication which has helped relieve leg pain. However patient states that over the last week she's had gradual onset of diffuse abdominal pain. Patient states she was constipated for one week but took milk of magnesia last night with a bowel movement. Patient states the bowel movement was "very hard, and it was hard to push out." Despite having bowel movement last night patient states ongoing diffuse abdominal pain today. Patient denies associated fevers, chills, chest pain, shortness of breath, nausea, vomiting, diarrhea, dysuria, hematuria, or blood in her stool. Patient denies aggravating or alleviating factors. Patient hascontinued take occasional pain medicine for leg pain but has not taken any additional medicine other than milk of magnesia for diffuse abdominal pain. Patient states she's been followed by Dr. Julieanne Manson in the past but states that Dr. Delrae Alfred has left the practice and therefore she does not have an assigned primary care physician at this time. Patient states she has history of fibroid surgery but denies any other surgeries. Symptoms were gradual onset, persistent and unchanging.   Past Medical History  Diagnosis Date  . Hypertension     Past Surgical History  Procedure Date  . Fribroid surgery     History reviewed. No pertinent family history.  History  Substance Use Topics  . Smoking status: Never Smoker   .  Smokeless tobacco: Not on file  . Alcohol Use: No    OB History    Grav Para Term Preterm Abortions TAB SAB Ect Mult Living                  Review of Systems  All other systems reviewed and are negative.    Allergies  Aspirin  Home Medications   Current Outpatient Rx  Name Route Sig Dispense Refill  . ASPIRIN EC 81 MG PO TBEC Oral Take 81 mg by mouth daily.        BP 150/80  Pulse 80  Temp(Src) 97.5 F (36.4 C) (Oral)  Resp 18  SpO2 100%  Physical Exam  Nursing note and vitals reviewed. Constitutional: She is oriented to person, place, and time. She appears well-developed and well-nourished. No distress.  HENT:  Head: Normocephalic and atraumatic.  Eyes: Conjunctivae are normal.  Neck: Normal range of motion. Neck supple.  Cardiovascular: Normal rate, regular rhythm, normal heart sounds and intact distal pulses.  Exam reveals no gallop and no friction rub.   No murmur heard. Pulmonary/Chest: Effort normal and breath sounds normal. No respiratory distress. She has no wheezes. She has no rales. She exhibits no tenderness.  Abdominal: Soft. Bowel sounds are normal. She exhibits no distension and no mass. There is tenderness. There is no rebound and no guarding.       Diffuse TTP of entire abdomen without rigidity or peritoneal signs.   Genitourinary: Rectum normal. Rectal exam shows no mass, no tenderness and anal tone normal. Guaiac negative stool.       Scant amount  of soft brown stool in rectal vault. No impaction.   Musculoskeletal: Normal range of motion. She exhibits no edema and no tenderness.  Neurological: She is alert and oriented to person, place, and time.  Skin: Skin is warm and dry. No rash noted. She is not diaphoretic. No erythema.  Psychiatric: She has a normal mood and affect.    ED Course  Procedures (including critical care time)  Patient is requesting food. Will remain NPO until imaging. Results.   Labs Reviewed  COMPREHENSIVE METABOLIC  PANEL - Abnormal; Notable for the following:    Sodium 134 (*)    Glucose, Bld 112 (*)    GFR calc non Af Amer 82 (*)    All other components within normal limits  CBC  DIFFERENTIAL  LIPASE, BLOOD  OCCULT BLOOD, POC DEVICE  URINALYSIS, ROUTINE W REFLEX MICROSCOPIC   Ct Abdomen Pelvis W Contrast  02/29/2012  *RADIOLOGY REPORT*  Clinical Data: Mid and lower abdominal pain.  Nausea. Constipation.  CT ABDOMEN AND PELVIS WITH CONTRAST  Technique:  Multidetector CT imaging of the abdomen and pelvis was performed following the standard protocol during bolus administration of intravenous contrast.  Contrast:  100 ml Omnipaque 300  Comparison: Abdominal obstruction series 02/29/2012  Findings: Minimal fibrosis or atelectasis in the lung bases.  Small esophageal hiatal hernia.  The liver, spleen, gallbladder, pancreas, adrenal glands, kidneys, abdominal aorta, and retroperitoneal lymph nodes are unremarkable.  The stomach is decompressed.  Small bowel are not dilated.  Stool filled colon without distension or apparent wall thickening.  Incidental note of redundant sigmoid colon.  No free fluid or free air in the abdomen.  Pelvis:  Small left inguinal hernia containing fat.  Uterus and adnexal structures are not pathologically enlarged.  No significant lymphadenopathy in the pelvis.  Bladder wall is not thickened.  No free or loculated pelvic fluid collections.  The appendix is normal.  No inflammatory changes involving the sigmoid colon.  Mild degenerative changes in the lumbar spine.  No destructive bone lesions appreciated.  IMPRESSION: No acute process demonstrated in the abdomen or pelvis.  Small left inguinal hernia containing fat.  Redundant sigmoid colon.  Stool filled colon.  No evidence of colonic distension or wall thickening.  Original Report Authenticated By: Marlon Pel, M.D.   Dg Abd Acute W/chest  02/29/2012  *RADIOLOGY REPORT*  Clinical Data: 71 year old female with epigastric pain,  abdominal pain.  ACUTE ABDOMEN SERIES (ABDOMEN 2 VIEW & CHEST 1 VIEW)  Comparison: 02/10/2008.  Findings: Slightly lower lung volumes.  Stable eventration of the diaphragm. Stable cardiomegaly and mediastinal contours.  No pneumothorax or pneumoperitoneum.  Mild crowding of markings at the lung bases.  Left axillary/chest wall hyperdense oval mass measuring 3 cm. A portion of this lesion was visible on the 2009 comparison.  It might represent a chronic calcified lymph node.  Nonobstructed bowel gas pattern.  Retained stool in the right colon.  Negative abdominal and pelvic visceral contours. No acute osseous abnormality identified.  IMPRESSION: 1. Nonobstructed bowel gas pattern, no free air. 2. Stable cardiomegaly.  No acute cardiopulmonary abnormality. 3.  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  Original Report Authenticated By: Harley Hallmark, M.D.     1. Constipation   2. Abdominal  pain, other specified site       MDM  Patient afebrile with abdomen soft and nonacute. CT scan shows colon full stool but no other acute findings.  Spoke at length with patient about constipation and narcotic pain medicine use. Spoke about changing or worsening symptoms it should prompt return to ER. Patient voices her understanding and is agreeable plan.        Jenness [place], [place] 02/29/12 2326

## 2012-02-29 NOTE — ED Notes (Signed)
Patient presents with constipation x 1 week and mid to lower abdominal pain.  Patient denies n/v.  Last bowel movement was 1 week ago which is not normal for patient. Patient reports she usually has a bowel movement daily.  Patient reports she took milk of magnesia last night without results.

## 2012-09-18 ENCOUNTER — Other Ambulatory Visit (HOSPITAL_COMMUNITY): Payer: Self-pay | Admitting: Family

## 2012-09-18 DIAGNOSIS — Z1231 Encounter for screening mammogram for malignant neoplasm of breast: Secondary | ICD-10-CM

## 2012-10-04 ENCOUNTER — Ambulatory Visit (HOSPITAL_COMMUNITY)
Admission: RE | Admit: 2012-10-04 | Discharge: 2012-10-04 | Disposition: A | Discharge status: Home / Self Care | Payer: Medicare Other | Source: Ambulatory Visit | Attending: Family | Admitting: Family

## 2012-10-04 ENCOUNTER — Ambulatory Visit (HOSPITAL_COMMUNITY): Payer: Medicare Other

## 2012-10-04 DIAGNOSIS — Z1231 Encounter for screening mammogram for malignant neoplasm of breast: Secondary | ICD-10-CM | POA: Insufficient documentation

## 2013-06-14 ENCOUNTER — Emergency Department (HOSPITAL_COMMUNITY): Payer: Medicare Other

## 2013-06-14 ENCOUNTER — Emergency Department (HOSPITAL_COMMUNITY)
Admission: EM | Admit: 2013-06-14 | Discharge: 2013-06-14 | Discharge status: Against Medical Advice | Payer: Medicare Other | Attending: Emergency Medicine | Admitting: Emergency Medicine

## 2013-06-14 ENCOUNTER — Encounter (HOSPITAL_COMMUNITY): Payer: Self-pay | Admitting: *Deleted

## 2013-06-14 DIAGNOSIS — I10 Essential (primary) hypertension: Secondary | ICD-10-CM | POA: Insufficient documentation

## 2013-06-14 DIAGNOSIS — R05 Cough: Secondary | ICD-10-CM | POA: Insufficient documentation

## 2013-06-14 DIAGNOSIS — R0602 Shortness of breath: Secondary | ICD-10-CM | POA: Insufficient documentation

## 2013-06-14 DIAGNOSIS — R0789 Other chest pain: Secondary | ICD-10-CM | POA: Insufficient documentation

## 2013-06-14 DIAGNOSIS — R079 Chest pain, unspecified: Secondary | ICD-10-CM

## 2013-06-14 DIAGNOSIS — R059 Cough, unspecified: Secondary | ICD-10-CM | POA: Insufficient documentation

## 2013-06-14 DIAGNOSIS — R109 Unspecified abdominal pain: Secondary | ICD-10-CM | POA: Insufficient documentation

## 2013-06-14 DIAGNOSIS — Z7982 Long term (current) use of aspirin: Secondary | ICD-10-CM | POA: Insufficient documentation

## 2013-06-14 DIAGNOSIS — R110 Nausea: Secondary | ICD-10-CM | POA: Insufficient documentation

## 2013-06-14 LAB — CBC
HCT: 38.4 % (ref 36.0–46.0)
Hemoglobin: 12.6 g/dL (ref 12.0–15.0)
MCH: 27.8 pg (ref 26.0–34.0)
MCHC: 32.8 g/dL (ref 30.0–36.0)
MCV: 84.8 fL (ref 78.0–100.0)

## 2013-06-14 LAB — BASIC METABOLIC PANEL
BUN: 16 mg/dL (ref 6–23)
Calcium: 8.9 mg/dL (ref 8.4–10.5)
Creatinine, Ser: 0.71 mg/dL (ref 0.50–1.10)
GFR calc non Af Amer: 85 mL/min — ABNORMAL LOW (ref >90)
Glucose, Bld: 148 mg/dL — ABNORMAL HIGH (ref 70–99)

## 2013-06-14 LAB — HEPATIC FUNCTION PANEL
ALT: 25 U/L (ref 0–35)
AST: 24 U/L (ref 0–37)
Bilirubin, Direct: <0.1 mg/dL (ref 0.0–0.3)
Total Bilirubin: 0.5 mg/dL (ref 0.3–1.2)

## 2013-06-14 MED ORDER — NITROGLYCERIN 0.4 MG SL SUBL: 0.4000 mg | SUBLINGUAL_TABLET | SUBLINGUAL | Status: DC | PRN

## 2013-06-14 NOTE — ED Provider Notes (Signed)
History    CSN: [id number] Arrival date & time 06/14/13  1008  First MD Initiated Contact with Patient 06/14/13 1117     Chief Complaint  Patient presents with  . Chest Pain  . Shortness of Breath   (Consider location/radiation/quality/duration/timing/severity/associated sxs/prior Treatment) HPI  72 y.o. Female with complaints of upper abdominal pain, lower chest, discomfort, mostly epigastric with radiation to right axilla and back.  Patient with nausea, no vomiting.  Patient had symptoms began about 0800, ate breakfast at 0900- oatmeal and banana.  Pain was severe, now moderate.  Patient took tylenol.  Pmd Dr. [facility] [employer].  Denies prior similar symptoms.  Denies fever, chills, diarrhea, uti symptoms.  States nonproductive cough.  Pain with deep breath.   Past Medical History  Diagnosis Date  . Hypertension    Past Surgical History  Procedure Laterality Date  . Fribroid surgery     History reviewed. No pertinent family history. History  Substance Use Topics  . Smoking status: Never Smoker   . Smokeless tobacco: Not on file  . Alcohol Use: No   OB History   Grav Para Term Preterm Abortions TAB SAB Ect Mult Living                 Review of Systems  Allergies  Aspirin  Home Medications   Current Outpatient Rx  Name  Route  Sig  Dispense  Refill  . acetaminophen (TYLENOL) 325 MG tablet   Oral   Take 650 mg by mouth every 6 (six) hours as needed for pain.         [name] aspirin EC 81 MG tablet   Oral   Take 81 mg by mouth daily.           [name] lisinopril (PRINIVIL,ZESTRIL) 10 MG tablet   Oral   Take 10 mg by mouth at bedtime.         . Multiple Vitamin (MULTI-VITAMIN DAILY PO)   Oral   Take 1 tablet by mouth daily.          BP 141/64  Pulse 86  Temp(Src) 97.8 F (36.6 C) (Oral)  Resp 20  SpO2 96% Physical Exam  Nursing note and vitals reviewed. Constitutional: She is oriented to person, place, and time. She appears well-developed and well-nourished.   HENT:  Head: Normocephalic and atraumatic.  Right Ear: External ear normal.  Left Ear: External ear normal.  Nose: Nose normal.  Mouth/Throat: Oropharynx is clear and moist.  Eyes: Conjunctivae and EOM are normal. Pupils are equal, round, and reactive to light.  Neck: Normal range of motion. Neck supple.  Cardiovascular: Normal rate, regular rhythm, normal heart sounds and intact distal pulses.   Pulmonary/Chest: Effort normal and breath sounds normal.  Abdominal: Soft. Bowel sounds are normal. There is tenderness.  Mild ruq and epigastric discomfort  Musculoskeletal: Normal range of motion. She exhibits edema.  Bilateral lower extremity edema trace   Neurological: She is alert and oriented to person, place, and time. She has normal reflexes.  Skin: Skin is warm and dry.  Psychiatric: She has a normal mood and affect. Her behavior is normal. Thought content normal.    ED Course  Procedures (including critical care time) Labs Reviewed  CBC  BASIC METABOLIC PANEL  PRO B NATRIURETIC PEPTIDE  POCT I-STAT TROPONIN I   Dg Chest 2 View  06/14/2013   *RADIOLOGY REPORT*  Clinical Data: Chest pain, shortness of breath, neck pain, history hypertension  CHEST - 2 VIEW  Comparison: 12/22/2011  Findings: Minimal enlargement of cardiac silhouette. Mediastinal contours and pulmonary vascularity normal. Very minimal bibasilar atelectasis. Lungs otherwise clear. No pleural effusion or pneumothorax. Bones unremarkable.  IMPRESSION: Minimal enlargement of cardiac silhouette and bibasilar atelectasis.   Original Report Authenticated By: Ulyses Southward, M.D.   No diagnosis found.    Date: 06/14/2013  Rate: 92  Rhythm: normal sinus rhythm  QRS Axis: normal  Intervals: normal  ST/T Wave abnormalities: normal  Conduction Disutrbances:none  Narrative Interpretation:   Old EKG Reviewed: unchanged From 4/13  MDM  Patient with epigastric pain that began this a.m. and radiated to her right shoulder.  She had some mild abdominal tenderness to palpation on exam. EKG does not show any acute ischemic changes and troponin is normal. Patient's pain has resolved here without intervention. Patient is advised to stay in the hospital for further evaluation of this as possible chest pain. She states that she must go home and does not want to stay in the hospital. She's been advised of risk of injury and death. She did buy she can return at any time to the hospital. She is signing out AGAINST MEDICAL ADVICE.    Hilario Quarry, MD 06/14/13 [phone]

## 2013-06-14 NOTE — ED Notes (Signed)
Pt reports having mid chest pain, epigastric pain and sob that started yesterday. Having nausea, denies vomiting. ekg done at triage.

## 2013-08-06 ENCOUNTER — Other Ambulatory Visit (HOSPITAL_COMMUNITY): Payer: Self-pay | Admitting: Family

## 2013-08-06 DIAGNOSIS — Z1231 Encounter for screening mammogram for malignant neoplasm of breast: Secondary | ICD-10-CM

## 2013-09-01 ENCOUNTER — Emergency Department (HOSPITAL_COMMUNITY)
Admission: EM | Admit: 2013-09-01 | Discharge: 2013-09-01 | Disposition: A | Discharge status: Home / Self Care | Payer: Medicare Other | Attending: Emergency Medicine | Admitting: Emergency Medicine

## 2013-09-01 ENCOUNTER — Encounter (HOSPITAL_COMMUNITY): Payer: Self-pay | Admitting: Emergency Medicine

## 2013-09-01 ENCOUNTER — Emergency Department (HOSPITAL_COMMUNITY): Payer: Medicare Other

## 2013-09-01 DIAGNOSIS — Z79899 Other long term (current) drug therapy: Secondary | ICD-10-CM | POA: Insufficient documentation

## 2013-09-01 DIAGNOSIS — R110 Nausea: Secondary | ICD-10-CM | POA: Insufficient documentation

## 2013-09-01 DIAGNOSIS — R0602 Shortness of breath: Secondary | ICD-10-CM | POA: Insufficient documentation

## 2013-09-01 DIAGNOSIS — Z7982 Long term (current) use of aspirin: Secondary | ICD-10-CM | POA: Insufficient documentation

## 2013-09-01 DIAGNOSIS — I10 Essential (primary) hypertension: Secondary | ICD-10-CM | POA: Insufficient documentation

## 2013-09-01 DIAGNOSIS — R1013 Epigastric pain: Secondary | ICD-10-CM | POA: Insufficient documentation

## 2013-09-01 DIAGNOSIS — R0789 Other chest pain: Secondary | ICD-10-CM | POA: Diagnosis present

## 2013-09-01 LAB — CBC
HCT: 38.8 % (ref 36.0–46.0)
Hemoglobin: 12.8 g/dL (ref 12.0–15.0)
MCHC: 33 g/dL (ref 30.0–36.0)
MCV: 85.1 fL (ref 78.0–100.0)
RDW: 12.9 % (ref 11.5–15.5)

## 2013-09-01 LAB — BASIC METABOLIC PANEL
BUN: 15 mg/dL (ref 6–23)
Chloride: 102 mEq/L (ref 96–112)
Creatinine, Ser: 0.71 mg/dL (ref 0.50–1.10)
GFR calc Af Amer: >90 mL/min (ref >90)
GFR calc non Af Amer: 85 mL/min — ABNORMAL LOW (ref >90)
Glucose, Bld: 122 mg/dL — ABNORMAL HIGH (ref 70–99)

## 2013-09-01 LAB — POCT I-STAT TROPONIN I: Troponin i, poc: 0.01 ng/mL (ref 0.00–0.08)

## 2013-09-01 MED ORDER — ONDANSETRON HCL 4 MG/2ML IJ SOLN
4.0000 mg | Freq: Once | INTRAMUSCULAR | Status: AC
  Administered 2013-09-01: 4 mg via INTRAVENOUS
  Filled 2013-09-01: qty 2

## 2013-09-01 MED ORDER — GI COCKTAIL ~~LOC~~
30.0000 mL | Freq: Once | ORAL | Status: AC
  Administered 2013-09-01: 30 mL via ORAL
  Filled 2013-09-01: qty 30

## 2013-09-01 NOTE — ED Provider Notes (Signed)
CSN: [id number]     Arrival date & time 09/01/13  2130 History   First MD Initiated Contact with Patient 09/01/13 [phone]     Chief Complaint  Patient presents with  . Chest Pain   (Consider location/radiation/quality/duration/timing/severity/associated sxs/prior Treatment) Patient is a 72 y.o. female presenting with chest pain. The history is provided by the patient.  Chest Pain Pain location:  Epigastric Pain quality: pressure   Pain radiates to:  Does not radiate Pain radiates to the back: no   Pain severity:  Moderate Onset quality:  Sudden Duration:  30 minutes Timing:  Constant Progression:  Resolved Chronicity:  Recurrent Context: at rest   Relieved by:  Aspirin and nitroglycerin (morphine) Worsened by:  Nothing tried Ineffective treatments:  None tried Associated symptoms: shortness of breath   Associated symptoms: no abdominal pain, no back pain, no cough, no dizziness, no fatigue, no fever, no headache, no nausea and not vomiting     Past Medical History  Diagnosis Date  . Hypertension    Past Surgical History  Procedure Laterality Date  . Fribroid surgery    . Abdominal hysterectomy     No family history on file. History  Substance Use Topics  . Smoking status: Never Smoker   . Smokeless tobacco: Not on file  . Alcohol Use: No   OB History   Grav Para Term Preterm Abortions TAB SAB Ect Mult Living                 Review of Systems  Constitutional: Negative for fever and fatigue.  HENT: Negative for congestion, drooling and neck pain.   Eyes: Negative for pain.  Respiratory: Positive for shortness of breath. Negative for cough.   Cardiovascular: Positive for chest pain.  Gastrointestinal: Negative for nausea, vomiting, abdominal pain and diarrhea.  Genitourinary: Negative for dysuria and hematuria.  Musculoskeletal: Negative for back pain and gait problem.  Skin: Negative for color change.  Neurological: Negative for dizziness and headaches.   Hematological: Negative for adenopathy.  Psychiatric/Behavioral: Negative for behavioral problems.  All other systems reviewed and are negative.    Allergies  Aspirin  Home Medications   Current Outpatient Rx  Name  Route  Sig  Dispense  Refill  . acetaminophen (TYLENOL) 325 MG tablet   Oral   Take 650 mg by mouth every 6 (six) hours as needed for pain.         [name] aspirin EC 81 MG tablet   Oral   Take 81 mg by mouth daily.           [name] lisinopril (PRINIVIL,ZESTRIL) 10 MG tablet   Oral   Take 10 mg by mouth at bedtime.         . Multiple Vitamin (MULTI-VITAMIN DAILY PO)   Oral   Take 1 tablet by mouth daily.          BP 119/67  Pulse 68  Temp(Src) 97.8 F (36.6 C) (Oral)  Resp 18  Ht 5\' 3"  (1.6 m)  Wt 180 lb (81.647 kg)  BMI 31.89 kg/m2  SpO2 98% Physical Exam  Nursing note and vitals reviewed. Constitutional: She is oriented to person, place, and time. She appears well-developed and well-nourished.  HENT:  Head: Normocephalic.  Mouth/Throat: Oropharynx is clear and moist. No oropharyngeal exudate.  Eyes: Conjunctivae and EOM are normal. Pupils are equal, round, and reactive to light.  Neck: Normal range of motion. Neck supple.  Cardiovascular: Normal rate, regular rhythm, normal heart sounds and  intact distal pulses.  Exam reveals no gallop and no friction rub.   No murmur heard. Pulmonary/Chest: Effort normal and breath sounds normal. No respiratory distress. She has no wheezes.  Abdominal: Soft. Bowel sounds are normal. There is tenderness (mild ttp of epig area). There is no rebound and no guarding.  Musculoskeletal: Normal range of motion. She exhibits no edema and no tenderness.  Neurological: She is alert and oriented to person, place, and time.  Skin: Skin is warm and dry.  Psychiatric: She has a normal mood and affect. Her behavior is normal.    ED Course  Procedures (including critical care time) Labs Review Labs Reviewed  BASIC METABOLIC  PANEL - Abnormal; Notable for the following:    Glucose, Bld 122 (*)    GFR calc non Af Amer 85 (*)    All other components within normal limits  CBC  POCT I-STAT TROPONIN I   Imaging Review Dg Chest 2 View  09/01/2013   CLINICAL DATA:  Chest pain and shortness of breath.  EXAM: CHEST  2 VIEW  COMPARISON:  06/14/2013 and 12/22/2011  FINDINGS: Lungs are adequately inflated without focal consolidation or effusion. There is mild flattening of the hemidiaphragms. Cardiomediastinal silhouette and remainder of the exam is unchanged.  IMPRESSION: No acute cardiopulmonary disease.   Electronically Signed   By: Elberta Fortis M.D.   On: 09/01/2013 10:39    Date: 09/01/2013  Rate: 67  Rhythm: normal sinus rhythm  QRS Axis: normal  Intervals: normal  ST/T Wave abnormalities: normal  Conduction Disutrbances:none  Narrative Interpretation: No ST or T wave changes consistent with ischemia.    Old EKG Reviewed: unchanged   MDM   1. Atypical chest pain    9:14 AM 72 y.o. female with a history of hypertension presents with epigastric pressure, nausea, and mild shortness of breath that began an hour ago at rest and lasted approximately 30 minutes. The patient called EMS d/t continued symptoms. She got aspirin and nitroglycerin and morphine in route. The patient feels better now on exam and refuses any pain medicine. She is afebrile and vital signs are unremarkable here. Her pain is atypical for cardiac but will get screening labwork. On exam she has tenderness to palpation in the epigastric area. She has been seen here previously for similar symptoms. At that time she left AMA.  Pt feeling better on exam and would like to go home. I informed her that I would prefer she be admitted for evaluation. She would still like to leave as she takes care of her husband at home who is chronically ill. I do believe her cp is atypical and she is low risk per HEART score given few RF's for heart disease and age w/ normal  ecg, neg trop, and low suspicion. Will get delta trop.   2:41 PM: Delta trop neg. Pt remains asx and would like to leave.  I have discussed the diagnosis/risks/treatment options with the patient and believe the pt to be eligible for discharge home to follow-up with pcp this week. We also discussed returning to the ED immediately if new or worsening sx occur. We discussed the sx which are most concerning (e.g., return of cp, sob) that necessitate immediate return. Any new prescriptions provided to the patient are listed below.  New Prescriptions   No medications on file     Junius Argyle, MD 09/01/13 1444

## 2013-09-01 NOTE — Progress Notes (Signed)
Case Manager consult from patients RN.APatients has requested to speak with the Case Manager.Met Patient at bedside.Case Manager role explained to patient.Patient verbalizes her understanding.Patients reports she is in ED today with increased chest pain / discomfort.However at time of Case Manager Consult Patient denies pain and reports she is feeling ok. Patient reports she resides with her Husband who has Tube Feedings and has CAP services.Patient reports her brother is with her husband But she usually helps him to perform his ADL  Activities.Patient requested a list of local home health- Private sitter contacts.Patient verbalizes she will ask her brother to call some numbers.This Clinical research associate encouraged the patient  To seek family support / help during her ED visit and also encouraged the patient to contact her husbands PCP for input with her husbands home health needs.Patient states she would  Like some PCP resources.Patient provided with PCP resources and a [place] [address]- resource card.Patient is receptive to plan and resources and verbalized her understanding of  Verbal / written resources today.

## 2013-09-01 NOTE — ED Notes (Signed)
Received pt from home via EMS with c/o Chest tightness. Pt given 324 mg of ASA, 3 Nitro and 4 mg of morphine by EMS

## 2013-09-23 ENCOUNTER — Encounter (HOSPITAL_COMMUNITY): Payer: Self-pay | Admitting: Emergency Medicine

## 2013-09-23 ENCOUNTER — Emergency Department (INDEPENDENT_AMBULATORY_CARE_PROVIDER_SITE_OTHER)
Admission: EM | Admit: 2013-09-23 | Discharge: 2013-09-23 | Disposition: A | Discharge status: Home / Self Care | Payer: Medicare Other | Source: Home / Self Care

## 2013-09-23 DIAGNOSIS — M7521 Bicipital tendinitis, right shoulder: Secondary | ICD-10-CM

## 2013-09-23 DIAGNOSIS — M7520 Bicipital tendinitis, unspecified shoulder: Secondary | ICD-10-CM

## 2013-09-23 MED ORDER — DICLOFENAC SODIUM 1 % TD GEL: 4.0000 g | Freq: Four times a day (QID) | TRANSDERMAL | Status: DC

## 2013-09-23 NOTE — ED Notes (Signed)
Patient and spouse being seen in the same room

## 2013-09-23 NOTE — ED Provider Notes (Signed)
CSN: [id number]     Arrival date & time 09/23/13  1731 History   None    Chief Complaint  Patient presents with  . Arm Pain  . Wrist Pain   (Consider location/radiation/quality/duration/timing/severity/associated sxs/prior Treatment) Patient is a 72 y.o. female presenting with arm pain and wrist pain. The history is provided by the patient.  Arm Pain This is a new problem. The current episode started more than 1 week ago (mult joint aches). The problem has not changed since onset.Pertinent negatives include no chest pain, no abdominal pain and no shortness of breath.  Wrist Pain Pertinent negatives include no chest pain, no abdominal pain and no shortness of breath.    Past Medical History  Diagnosis Date  . Hypertension    Past Surgical History  Procedure Laterality Date  . Fribroid surgery    . Abdominal hysterectomy     No family history on file. History  Substance Use Topics  . Smoking status: Never Smoker   . Smokeless tobacco: Not on file  . Alcohol Use: No   OB History   Grav Para Term Preterm Abortions TAB SAB Ect Mult Living                 Review of Systems  Constitutional: Negative.   Respiratory: Negative for shortness of breath.   Cardiovascular: Negative for chest pain.  Gastrointestinal: Negative for abdominal pain.  Musculoskeletal: Positive for back pain and myalgias. Negative for joint swelling and neck pain.  Skin: Negative.     Allergies  Aspirin  Home Medications   Current Outpatient Rx  Name  Route  Sig  Dispense  Refill  . acetaminophen (TYLENOL) 325 MG tablet   Oral   Take 650 mg by mouth every 6 (six) hours as needed for pain.         [name] amoxicillin (AMOXIL) 500 MG capsule   Oral   Take 500 mg by mouth daily as needed.         . Ascorbic Acid (VITAMIN C PO)   Oral   Take 1 tablet by mouth daily.         [name] aspirin EC 81 MG tablet   Oral   Take 81 mg by mouth daily.           . diclofenac sodium (VOLTAREN) 1 % GEL  Topical   Apply 4 g topically 4 (four) times daily.   3 Tube   1   . lisinopril (PRINIVIL,ZESTRIL) 10 MG tablet   Oral   Take 10 mg by mouth at bedtime.         . magnesium hydroxide (MILK OF MAGNESIA) 400 MG/5ML suspension   Oral   Take 30 mLs by mouth daily as needed for constipation.         . Multiple Vitamin (MULTI-VITAMIN DAILY PO)   Oral   Take 1 tablet by mouth daily.          There were no vitals taken for this visit. Physical Exam  Nursing note and vitals reviewed. Constitutional: She is oriented to person, place, and time. She appears well-developed and well-nourished.  Musculoskeletal: She exhibits tenderness.       Right wrist: She exhibits tenderness. She exhibits no deformity.       Left upper arm: She exhibits tenderness.  Neurological: She is alert and oriented to person, place, and time.  Skin: Skin is warm and dry.    ED Course  Procedures (including critical care time)  Labs Review Labs Reviewed - No data to display Imaging Review No results found.    MDM      Linna Hoff, MD 09/23/13 [phone]

## 2013-09-23 NOTE — ED Notes (Addendum)
Right wrist pain and left arm pain, wrist pain for 2weeks, back pain for one week, left arm since yesterday.  No known injury

## 2013-09-26 ENCOUNTER — Ambulatory Visit: Payer: Medicare Other

## 2013-10-07 ENCOUNTER — Ambulatory Visit (HOSPITAL_COMMUNITY)
Admission: RE | Admit: 2013-10-07 | Discharge: 2013-10-07 | Disposition: A | Discharge status: Home / Self Care | Payer: Medicare Other | Source: Ambulatory Visit | Attending: Family | Admitting: Family

## 2013-10-07 DIAGNOSIS — Z1231 Encounter for screening mammogram for malignant neoplasm of breast: Secondary | ICD-10-CM

## 2013-10-19 ENCOUNTER — Encounter (HOSPITAL_COMMUNITY): Payer: Self-pay | Admitting: Emergency Medicine

## 2013-10-19 DIAGNOSIS — R1013 Epigastric pain: Secondary | ICD-10-CM | POA: Insufficient documentation

## 2013-10-19 DIAGNOSIS — Z79899 Other long term (current) drug therapy: Secondary | ICD-10-CM | POA: Insufficient documentation

## 2013-10-19 DIAGNOSIS — I10 Essential (primary) hypertension: Secondary | ICD-10-CM | POA: Insufficient documentation

## 2013-10-19 DIAGNOSIS — Z7982 Long term (current) use of aspirin: Secondary | ICD-10-CM | POA: Insufficient documentation

## 2013-10-19 DIAGNOSIS — R0789 Other chest pain: Principal | ICD-10-CM | POA: Insufficient documentation

## 2013-10-19 NOTE — ED Notes (Signed)
PT c/o intermittent SOB, and Palpitations, with rib pain.

## 2013-10-20 ENCOUNTER — Observation Stay (HOSPITAL_COMMUNITY)
Admission: EM | Admit: 2013-10-20 | Discharge: 2013-10-21 | Disposition: A | Discharge status: Home / Self Care | Payer: Medicare Other | Attending: Internal Medicine | Admitting: Internal Medicine

## 2013-10-20 ENCOUNTER — Encounter (HOSPITAL_COMMUNITY): Payer: Self-pay | Admitting: *Deleted

## 2013-10-20 DIAGNOSIS — E66811 Obesity, class 1: Secondary | ICD-10-CM | POA: Diagnosis present

## 2013-10-20 DIAGNOSIS — R0789 Other chest pain: Secondary | ICD-10-CM

## 2013-10-20 DIAGNOSIS — F4320 Adjustment disorder, unspecified: Secondary | ICD-10-CM

## 2013-10-20 DIAGNOSIS — E871 Hypo-osmolality and hyponatremia: Secondary | ICD-10-CM

## 2013-10-20 DIAGNOSIS — R05 Cough: Secondary | ICD-10-CM

## 2013-10-20 DIAGNOSIS — M545 Low back pain, unspecified: Secondary | ICD-10-CM

## 2013-10-20 DIAGNOSIS — I10 Essential (primary) hypertension: Secondary | ICD-10-CM

## 2013-10-20 DIAGNOSIS — R059 Cough, unspecified: Secondary | ICD-10-CM

## 2013-10-20 DIAGNOSIS — R5381 Other malaise: Secondary | ICD-10-CM

## 2013-10-20 DIAGNOSIS — E669 Obesity, unspecified: Secondary | ICD-10-CM

## 2013-10-20 DIAGNOSIS — E661 Drug-induced obesity: Secondary | ICD-10-CM | POA: Diagnosis present

## 2013-10-20 DIAGNOSIS — J309 Allergic rhinitis, unspecified: Secondary | ICD-10-CM

## 2013-10-20 DIAGNOSIS — G4700 Insomnia, unspecified: Secondary | ICD-10-CM

## 2013-10-20 DIAGNOSIS — F4321 Adjustment disorder with depressed mood: Secondary | ICD-10-CM | POA: Diagnosis present

## 2013-10-20 DIAGNOSIS — I519 Heart disease, unspecified: Secondary | ICD-10-CM

## 2013-10-20 DIAGNOSIS — R7309 Other abnormal glucose: Secondary | ICD-10-CM

## 2013-10-20 LAB — OSMOLALITY, URINE: Osmolality, Ur: 638 mOsm/kg (ref 390–1090)

## 2013-10-20 LAB — CBC
HCT: 42.4 % (ref 36.0–46.0)
MCHC: 33.7 g/dL (ref 30.0–36.0)
MCV: 86.2 fL (ref 78.0–100.0)
Platelets: 244 10*3/uL (ref 150–400)
RBC: 4.92 MIL/uL (ref 3.87–5.11)
RDW: 12.8 % (ref 11.5–15.5)
WBC: 9.6 10*3/uL (ref 4.0–10.5)

## 2013-10-20 LAB — TROPONIN I
Troponin I: <0.3 ng/mL (ref <0.30)
Troponin I: <0.3 ng/mL (ref <0.30)
Troponin I: <0.3 ng/mL (ref <0.30)

## 2013-10-20 LAB — BASIC METABOLIC PANEL
Calcium: 8.7 mg/dL (ref 8.4–10.5)
Creatinine, Ser: 0.77 mg/dL (ref 0.50–1.10)
GFR calc non Af Amer: 82 mL/min — ABNORMAL LOW (ref >90)
Sodium: 126 mEq/L — ABNORMAL LOW (ref 135–145)

## 2013-10-20 LAB — NA AND K (SODIUM & POTASSIUM), RAND UR: Potassium Urine: 38 mEq/L

## 2013-10-20 LAB — POCT I-STAT TROPONIN I
Troponin i, poc: 0.01 ng/mL (ref 0.00–0.08)
Troponin i, poc: 0 ng/mL (ref 0.00–0.08)

## 2013-10-20 LAB — PRO B NATRIURETIC PEPTIDE: Pro B Natriuretic peptide (BNP): 21.6 pg/mL (ref 0–125)

## 2013-10-20 MED ORDER — SODIUM CHLORIDE 0.9 % IV SOLN: INTRAVENOUS | Status: DC

## 2013-10-20 MED ORDER — LISINOPRIL 10 MG PO TABS
10.0000 mg | ORAL_TABLET | Freq: Every day | ORAL | Status: DC
  Administered 2013-10-20: 10 mg via ORAL
  Filled 2013-10-20 (×2): qty 1

## 2013-10-20 MED ORDER — ZOLPIDEM TARTRATE 5 MG PO TABS: 5.0000 mg | ORAL_TABLET | Freq: Every evening | ORAL | Status: DC | PRN

## 2013-10-20 MED ORDER — ONDANSETRON HCL 4 MG/2ML IJ SOLN: 4.0000 mg | Freq: Four times a day (QID) | INTRAMUSCULAR | Status: DC | PRN

## 2013-10-20 MED ORDER — ONDANSETRON HCL 4 MG PO TABS: 4.0000 mg | ORAL_TABLET | Freq: Four times a day (QID) | ORAL | Status: DC | PRN

## 2013-10-20 MED ORDER — SODIUM CHLORIDE 0.9 % IJ SOLN
3.0000 mL | Freq: Two times a day (BID) | INTRAMUSCULAR | Status: DC
  Administered 2013-10-20: 3 mL via INTRAVENOUS

## 2013-10-20 MED ORDER — OXYCODONE HCL 5 MG PO TABS: 5.0000 mg | ORAL_TABLET | ORAL | Status: DC | PRN

## 2013-10-20 MED ORDER — HYDROMORPHONE HCL PF 1 MG/ML IJ SOLN: 0.5000 mg | INTRAMUSCULAR | Status: DC | PRN

## 2013-10-20 MED ORDER — ALUM & MAG HYDROXIDE-SIMETH 200-200-20 MG/5ML PO SUSP: 30.0000 mL | Freq: Four times a day (QID) | ORAL | Status: DC | PRN

## 2013-10-20 MED ORDER — REGADENOSON 0.4 MG/5ML IV SOLN
0.4000 mg | Freq: Once | INTRAVENOUS | Status: DC
  Filled 2013-10-20: qty 5

## 2013-10-20 MED ORDER — ASPIRIN EC 325 MG PO TBEC
325.0000 mg | DELAYED_RELEASE_TABLET | Freq: Every day | ORAL | Status: DC
  Administered 2013-10-20 – 2013-10-21 (×2): 325 mg via ORAL
  Filled 2013-10-20 (×2): qty 1

## 2013-10-20 MED ORDER — ENOXAPARIN SODIUM 40 MG/0.4ML ~~LOC~~ SOLN
40.0000 mg | SUBCUTANEOUS | Status: DC
  Administered 2013-10-20 – 2013-10-21 (×2): 40 mg via SUBCUTANEOUS
  Filled 2013-10-20 (×2): qty 0.4

## 2013-10-20 MED ORDER — ASPIRIN 81 MG PO CHEW
324.0000 mg | CHEWABLE_TABLET | Freq: Once | ORAL | Status: AC
  Administered 2013-10-20: 324 mg via ORAL
  Filled 2013-10-20: qty 4

## 2013-10-20 MED ORDER — PANTOPRAZOLE SODIUM 40 MG PO TBEC
40.0000 mg | DELAYED_RELEASE_TABLET | Freq: Every day | ORAL | Status: DC
  Administered 2013-10-21: 40 mg via ORAL
  Filled 2013-10-20: qty 1

## 2013-10-20 MED ORDER — ACETAMINOPHEN 650 MG RE SUPP: 650.0000 mg | Freq: Four times a day (QID) | RECTAL | Status: DC | PRN

## 2013-10-20 MED ORDER — SODIUM CHLORIDE 0.9 % IV SOLN
INTRAVENOUS | Status: DC
  Administered 2013-10-20: 10:00:00 via INTRAVENOUS

## 2013-10-20 MED ORDER — ENOXAPARIN SODIUM 150 MG/ML ~~LOC~~ SOLN: 1.0000 mg/kg | Freq: Two times a day (BID) | SUBCUTANEOUS | Status: DC

## 2013-10-20 MED ORDER — ACETAMINOPHEN 325 MG PO TABS: 650.0000 mg | ORAL_TABLET | Freq: Four times a day (QID) | ORAL | Status: DC | PRN

## 2013-10-20 MED ORDER — CLONAZEPAM 0.5 MG PO TABS
0.5000 mg | ORAL_TABLET | Freq: Two times a day (BID) | ORAL | Status: DC
  Administered 2013-10-20 – 2013-10-21 (×3): 0.5 mg via ORAL
  Filled 2013-10-20 (×3): qty 1

## 2013-10-20 NOTE — Progress Notes (Signed)
Pt seen and examined, admitted few hrs ago, stable, Cards consulted for possible AM stress test.

## 2013-10-20 NOTE — H&P (Signed)
[facility] History and Physical  Allison Franco NWG:[id number] DOB: 11-30-1940 DOA: 10/20/2013  Referring physician:  EDP PCP: Florentina Jenny, MD  Specialists:   Chief Complaint:  Chest Pain  HPI: Allison Franco is a 72 y.o. female who presents to the ED with complaints of Substernal and epigastric Chest pain  Described as heaviness and chest tightness lasting for 10 minutes at a time occuring just before she came to the ED. She reports having SOB and nausea associated with the discomfort.  She reports that she has the pain when she thinks about the recent loss of her husband 3 weeks ago and she has been having pain off and on x 3 weeks.   She was seen in the ED on and had 2 negative troponins and was discharged to home. She has been referred for admission for a further evalauation due to ischemic changes seen on EKG in the anterior leads however, the first troponin is negative.      Review of Systems: The patient denies anorexia, fever, chills, headaches, weight loss, vision loss, diplopia, dizziness, decreased hearing, rhinitis, hoarseness, syncope, dyspnea on exertion, peripheral edema, balance deficits, cough, hemoptysis, abdominal pain, nausea, vomiting, diarrhea, constipation, hematemesis, melena, hematochezia, severe indigestion/heartburn, dysuria, hematuria, incontinence, muscle weakness, suspicious skin lesions, transient blindness, difficulty walking, depression, unusual weight change, abnormal bleeding, enlarged lymph nodes, angioedema, and breast masses.    Past Medical History  Diagnosis Date  . Hypertension     Past Surgical History  Procedure Laterality Date  . Fribroid surgery    . Abdominal hysterectomy      Prior to Admission medications   Medication Sig Start Date End Date Taking? Authorizing Provider  Ascorbic Acid (VITAMIN C PO) Take 1 tablet by mouth daily.   Yes Historical Provider, MD  aspirin EC 81 MG tablet Take 81 mg by mouth daily.     Yes Historical  Provider, MD  lisinopril (PRINIVIL,ZESTRIL) 10 MG tablet Take 10 mg by mouth at bedtime.   Yes Historical Provider, MD  Multiple Vitamin (MULTI-VITAMIN DAILY PO) Take 1 tablet by mouth daily.   Yes Historical Provider, MD  traMADol (ULTRAM) 50 MG tablet Take 50 mg by mouth every 6 (six) hours as needed for moderate pain.   Yes Historical Provider, MD     No Known Allergies    Social History:  reports that she has never smoked. She does not have any smokeless tobacco history on file. She reports that she does not drink alcohol or use illicit drugs.     Family History:     Mother with diabetes  Father with HTN   Physical Exam:  GEN:  Pleasant Obese  72 y.o. African female  examined  and in no acute distress; cooperative with exam Filed Vitals:   10/20/13 0430 10/20/13 0445 10/20/13 0500 10/20/13 0515  BP: 139/59 112/58 116/58 116/62  Pulse: 72 66 73 67  Temp:      TempSrc:      Resp: 22 19 18 15   SpO2: 100% 99% 99% 100%   Blood pressure 116/62, pulse 67, temperature 97.9 F (36.6 C), temperature source Oral, resp. rate 15, SpO2 100.00%. PSYCH: SHe is alert and oriented x4; does not appear anxious does not appear depressed; affect is normal HEENT: Normocephalic and Atraumatic, Mucous membranes pink; PERRLA; EOM intact; Fundi:  Benign;  No scleral icterus, Nares: Patent, Oropharynx: Clear, Fair Dentition, Neck:  FROM, no cervical lymphadenopathy nor thyromegaly or carotid bruit; no JVD; Breasts:: Not examined CHEST WALL:  No tenderness CHEST: Normal respiration, clear to auscultation bilaterally HEART: Regular rate and rhythm; no murmurs rubs or gallops BACK: No kyphosis or scoliosis; no CVA tenderness ABDOMEN: Positive Bowel Sounds, Obese, soft non-tender; no masses, no organomegaly. Rectal Exam: Not done EXTREMITIES: No cyanosis, clubbing or edema; no ulcerations. Genitalia: not examined PULSES: 2+ and symmetric SKIN: Normal hydration no rash or ulceration CNS: Cranial  nerves 2-12 grossly intact no focal neurologic deficit    Labs on Admission:  Basic Metabolic Panel:  Recent Labs Lab 10/20/13 0050  NA 126*  K 3.8  CL 94*  CO2 24  GLUCOSE 110*  BUN 17  CREATININE 0.77  CALCIUM 8.7   Liver Function Tests: No results found for this basename: AST, ALT, ALKPHOS, BILITOT, PROT, ALBUMIN,  in the last 168 hours No results found for this basename: LIPASE, AMYLASE,  in the last 168 hours No results found for this basename: AMMONIA,  in the last 168 hours CBC:  Recent Labs Lab 10/20/13 0050  WBC 9.6  HGB 14.3  HCT 42.4  MCV 86.2  PLT 244   Cardiac Enzymes: No results found for this basename: CKTOTAL, CKMB, CKMBINDEX, TROPONINI,  in the last 168 hours  BNP (last 3 results)  Recent Labs  06/14/13 1053 10/20/13 0050  PROBNP 22.0 21.6   CBG: No results found for this basename: GLUCAP,  in the last 168 hours  Radiological Exams on Admission: No results found.   EKG: Independently reviewed. Normal sinus Rhythm with S_T depression in the Anterior Leads   Assessment/Plan Principal Problem:   Atypical chest pain Active Problems:   Hyponatremia   HYPERTENSION   HYPERGLYCEMIA, BORDERLINE   Grief reaction   Obesity    1.  Atypical Chest Pain-  Admit to Telemetry bed for monitoring and cycle troponins, placed on ASA Rx , No NTG due to Low normal Blood pressures. 2D ECHO ordered and consider inpatient versus Outpt Stress Testing.     2.  Hyponatremia-   Evaluate for SIADH,( she is not on diuretic Rx), Monitor Na+ levels.   Does not appear to have Volume Overload, start IVFs with NSS.   3.  HTN- on Lisinopril Rx and has good control.    4.  MIld Hyperglycemia-  Check HbA1C for screening.    5.  Acute Grief Reaction-  Needs Referral to Outpatient counseling.    6.  Obesity- Follow up with PCP for outpt management plans.    7.  DVT Prophylaxis with Lovenox.       Code Status:      FULL CODE Family Communication:    No Family  at Bedside  Disposition Plan:    Observation Status  Time spent:   37 Minutes  Ron Parker [facility] Pager [phone]  If 7PM-7AM, please contact night-coverage [email or web address] Password [facility] 10/20/2013, 5:47 AM

## 2013-10-20 NOTE — Progress Notes (Signed)
Echocardiogram 2D Echocardiogram has been performed.  Dorothey Baseman 10/20/2013, 3:42 PM

## 2013-10-20 NOTE — ED Provider Notes (Signed)
CSN: [id number]     Arrival date & time 10/19/13  2335 History   First MD Initiated Contact with Patient 10/20/13 [phone]     Chief Complaint  Patient presents with  . Shortness of Breath   (Consider location/radiation/quality/duration/timing/severity/associated sxs/prior Treatment) Patient is a 72 y.o. female presenting with shortness of breath.  Shortness of Breath Associated symptoms: abdominal pain    And complains of epigastric pain and mild shortness of breath onset 3 weeks ago intermittent lasting approximately 10 minutes at a time since the death of her husband 3 weeks ago. He only has symptoms when she thinks of her husband. Symptoms are not related to walking or exertion. Presently  asymptomatic. No treatment prior to coming here. Seen here October 5 for similar complaint. Referral back to her primary care physician however she is not followed up. Past Medical History  Diagnosis Date  . Hypertension    Past Surgical History  Procedure Laterality Date  . Fribroid surgery    . Abdominal hysterectomy     No family history on file. History  Substance Use Topics  . Smoking status: Never Smoker   . Smokeless tobacco: Not on file  . Alcohol Use: No   OB History   Grav Para Term Preterm Abortions TAB SAB Ect Mult Living                 Review of Systems  Constitutional: Negative.   HENT: Negative.   Respiratory: Positive for shortness of breath.   Cardiovascular: Negative.   Gastrointestinal: Positive for abdominal pain.  Musculoskeletal: Negative.   Skin: Negative.   Neurological: Negative.   Psychiatric/Behavioral: Negative.   All other systems reviewed and are negative.    Allergies  Aspirin  Home Medications   Current Outpatient Rx  Name  Route  Sig  Dispense  Refill  . acetaminophen (TYLENOL) 325 MG tablet   Oral   Take 650 mg by mouth every 6 (six) hours as needed for pain.         [name] amoxicillin (AMOXIL) 500 MG capsule   Oral   Take 500 mg by mouth  daily as needed.         . Ascorbic Acid (VITAMIN C PO)   Oral   Take 1 tablet by mouth daily.         [name] aspirin EC 81 MG tablet   Oral   Take 81 mg by mouth daily.           . diclofenac sodium (VOLTAREN) 1 % GEL   Topical   Apply 4 g topically 4 (four) times daily.   3 Tube   1   . lisinopril (PRINIVIL,ZESTRIL) 10 MG tablet   Oral   Take 10 mg by mouth at bedtime.         . magnesium hydroxide (MILK OF MAGNESIA) 400 MG/5ML suspension   Oral   Take 30 mLs by mouth daily as needed for constipation.         . Multiple Vitamin (MULTI-VITAMIN DAILY PO)   Oral   Take 1 tablet by mouth daily.          BP 150/89  Temp(Src) 97.6 F (36.4 C) (Oral)  Resp 18  SpO2 98% Physical Exam  Nursing note and vitals reviewed. Constitutional: She appears well-developed and well-nourished.  HENT:  Head: Normocephalic and atraumatic.  Eyes: Conjunctivae are normal. Pupils are equal, round, and reactive to light.  Neck: Neck supple. No tracheal deviation present. No  thyromegaly present.  Cardiovascular: Normal rate and regular rhythm.   No murmur heard. Pulmonary/Chest: Effort normal and breath sounds normal.  Abdominal: Soft. Bowel sounds are normal. She exhibits no distension. There is no tenderness.  Obese  Musculoskeletal: Normal range of motion. She exhibits no edema and no tenderness.  Neurological: She is alert. Coordination normal.  Skin: Skin is warm and dry. No rash noted.  Psychiatric: She has a normal mood and affect.    ED Course  Procedures (including critical care time) Labs Review Labs Reviewed  BASIC METABOLIC PANEL - Abnormal; Notable for the following:    Sodium 126 (*)    Chloride 94 (*)    Glucose, Bld 110 (*)    GFR calc non Af Amer 82 (*)    All other components within normal limits  PRO B NATRIURETIC PEPTIDE  CBC  TROPONIN I  POCT I-STAT TROPONIN I   Imaging Review No results found.  EKG Interpretation   None      Date:  10/20/2013  Rate: 90  Rhythm: normal sinus rhythm  QRS Axis: normal  Intervals: normal  ST/T Wave abnormalities: Nonspecific T wave changes possible anterior ischemic changes   Conduction Disutrbances:none  Narrative Interpretation:   Old EKG Reviewed: Nonspecific anterior T wave changes and questionable ischemic changes new over 09/01/2013 interpreted by me  Results for orders placed during the hospital encounter of 10/20/13  PRO B NATRIURETIC PEPTIDE      Result Value Range   Pro B Natriuretic peptide (BNP) 21.6  0 - 125 pg/mL  BASIC METABOLIC PANEL      Result Value Range   Sodium 126 (*) 135 - 145 mEq/L   Potassium 3.8  3.5 - 5.1 mEq/L   Chloride 94 (*) 96 - 112 mEq/L   CO2 24  19 - 32 mEq/L   Glucose, Bld 110 (*) 70 - 99 mg/dL   BUN 17  6 - 23 mg/dL   Creatinine, Ser 4.78  0.50 - 1.10 mg/dL   Calcium 8.7  8.4 - 29.5 mg/dL   GFR calc non Af Amer 82 (*) >90 mL/min   GFR calc Af Amer >90  >90 mL/min  CBC      Result Value Range   WBC 9.6  4.0 - 10.5 K/uL   RBC 4.92  3.87 - 5.11 MIL/uL   Hemoglobin 14.3  12.0 - 15.0 g/dL   HCT 62.1  30.8 - 65.7 %   MCV 86.2  78.0 - 100.0 fL   MCH 29.1  26.0 - 34.0 pg   MCHC 33.7  30.0 - 36.0 g/dL   RDW 84.6  96.2 - 95.2 %   Platelets 244  150 - 400 K/uL  POCT I-STAT TROPONIN I      Result Value Range   Troponin i, poc 0.01  0.00 - 0.08 ng/mL   Comment 3           POCT I-STAT TROPONIN I      Result Value Range   Troponin i, poc 0.00  0.00 - 0.08 ng/mL   Comment 3            No results found.  MDM  No diagnosis found. Chest pain is atypical for angina however patient has EKG changes, heart score equals 4  Spoke with Dr. Lovell Sheehan plan 23 hour observation telemetry Diagnosis #1 atypical chest pain #2 hyponatremia  Doug Sou, MD 10/20/13 [phone]

## 2013-10-21 LAB — CBC
MCH: 28.5 pg (ref 26.0–34.0)
Platelets: 261 10*3/uL (ref 150–400)
RBC: 4.7 MIL/uL (ref 3.87–5.11)
WBC: 7.9 10*3/uL (ref 4.0–10.5)

## 2013-10-21 LAB — BASIC METABOLIC PANEL
CO2: 23 mEq/L (ref 19–32)
Calcium: 8.7 mg/dL (ref 8.4–10.5)
Creatinine, Ser: 0.7 mg/dL (ref 0.50–1.10)
GFR calc Af Amer: >90 mL/min (ref >90)
GFR calc non Af Amer: 85 mL/min — ABNORMAL LOW (ref >90)
Glucose, Bld: 96 mg/dL (ref 70–99)
Sodium: 137 mEq/L (ref 135–145)

## 2013-10-21 MED ORDER — PANTOPRAZOLE SODIUM 40 MG PO TBEC: 40.0000 mg | DELAYED_RELEASE_TABLET | Freq: Every day | ORAL | Status: DC

## 2013-10-21 MED ORDER — CLONAZEPAM 0.5 MG PO TABS: 0.5000 mg | ORAL_TABLET | Freq: Two times a day (BID) | ORAL | Status: DC

## 2013-10-21 NOTE — Discharge Summary (Signed)
[facility]                                                                                   Allison Franco, is a 72 y.o. female  DOB [birth year]  MRN [id number].  Admission date:  10/20/2013  Admitting Physician  Ron Parker, MD  Discharge Date:  10/21/2013   Primary MD  Florentina Jenny, MD  Recommendations for primary care physician for things to follow:      Admission Diagnosis  Unspecified essential hypertension [401.9] Grief reaction [309.0] Hyponatremia [276.1] Obesity [278.00] Atypical chest pain [786.59]  Discharge Diagnosis  atypical chest pain, patient refused stress test  Principal Problem:   Atypical chest pain Active Problems:   HYPERTENSION   HYPERGLYCEMIA, BORDERLINE   Grief reaction   Hyponatremia   Obesity      Past Medical History  Diagnosis Date  . Hypertension     Past Surgical History  Procedure Laterality Date  . Fribroid surgery    . Abdominal hysterectomy       Discharge Condition: Stable   Follow-up Information   Follow up with Florentina Jenny, MD. Schedule an appointment as soon as possible for a visit in 3 weeks.   Specialty:  Family Medicine   Contact information:   [address] [place] [address] [phone]       Follow up with Willa Rough, MD. Schedule an appointment as soon as possible for a visit in 3 days.   Specialty:  Cardiology   Contact information:   [address] [place] [address] [phone]         Consults obtained -     Discharge Medications      Medication List         aspirin EC 81 MG tablet  Take 81 mg by mouth daily.     clonazePAM 0.5 MG tablet  Commonly known as:  KLONOPIN  Take 1 tablet (0.5 mg total) by mouth 2 (two) times daily.     lisinopril 10 MG tablet  Commonly known as:  PRINIVIL,ZESTRIL  Take 10 mg by mouth at bedtime.     MULTI-VITAMIN DAILY PO  Take 1 tablet by mouth daily.     pantoprazole 40 MG tablet  Commonly  known as:  PROTONIX  Take 1 tablet (40 mg total) by mouth daily.     traMADol 50 MG tablet  Commonly known as:  ULTRAM  Take 50 mg by mouth every 6 (six) hours as needed for moderate pain.     VITAMIN C PO  Take 1 tablet by mouth daily.         Diet and Activity recommendation: See Discharge Instructions below   Discharge Instructions     Follow with Primary MD Florentina Jenny, MD in 3 days   Get CBC, CMP, checked 3 days by Primary MD and again as instructed by your Primary MD.   Get Medicines reviewed and adjusted.  Please request your Prim.MD to go over all Hospital Tests and Procedure/Radiological results at the follow up, please get all Hospital records sent to your Prim MD by signing hospital release before you  go home.  Activity: As tolerated with Full fall precautions use walker/cane & assistance as needed   Diet:  Heart Healthy  For Heart failure patients - Check your Weight same time everyday, if you gain over 2 pounds, or you develop in leg swelling, experience more shortness of breath or chest pain, call your Primary MD immediately. Follow Cardiac Low Salt Diet and 1.8 lit/day fluid restriction.  Disposition Home   If you experience worsening of your admission symptoms, develop shortness of breath, life threatening emergency, suicidal or homicidal thoughts you must seek medical attention immediately by calling 911 or calling your MD immediately  if symptoms less severe.  You Must read complete instructions/literature along with all the possible adverse reactions/side effects for all the Medicines you take and that have been prescribed to you. Take any new Medicines after you have completely understood and accpet all the possible adverse reactions/side effects.   Do not drive and provide baby sitting services if your were admitted for syncope or siezures until you have seen by Primary MD or a Neurologist and advised to do so again.  Do not drive when taking Pain  medications.    Do not take more than prescribed Pain, Sleep and Anxiety Medications  Special Instructions: If you have smoked or chewed Tobacco  in the last 2 yrs please stop smoking, stop any regular Alcohol  and or any Recreational drug use.  Wear Seat belts while driving.   Please note  You were cared for by a hospitalist during your hospital stay. If you have any questions about your discharge medications or the care you received while you were in the hospital after you are discharged, you can call the unit and asked to speak with the hospitalist on call if the hospitalist that took care of you is not available. Once you are discharged, your primary care physician will handle any further medical issues. Please note that NO REFILLS for any discharge medications will be authorized once you are discharged, as it is imperative that you return to your primary care physician (or establish a relationship with a primary care physician if you do not have one) for your aftercare needs so that they can reassess your need for medications and monitor your lab values.    Major procedures and Radiology Reports - PLEASE review detailed and final reports for all details, in brief -   Echo  - Left ventricle: The cavity size was normal. Systolic function was normal. The estimated ejection fraction was in the range of 60% to 65%. Wall motion was normal; there were no regional wall motion abnormalities. Doppler parameters are consistent with abnormal left ventricular relaxation (grade 1 diastolic dysfunction). - Pulmonary arteries: Systolic pressure was mildly increased. PA peak pressure: 34mm Hg (S).     No results found.  Micro Results      No results found for this or any previous visit (from the past 240 hour(s)).   History of present illness and  Hospital Course:     Kindly see H&P for history of present illness and admission details, please review complete Labs, Consult reports and Test  reports for all details in brief Allison Franco, is a 72 y.o. female, patient with history of  hypertension, who recently lost her husband due to long-term illness and has been undergoing some sadness and grief the hospital with chest pain, her EKG was unremarkable, troponin was, echogram was stable with chronic diastolic is function with preserved EF and no wall  motion abnormality. She was pain-free during her hospital stay. She was scheduled for inpatient stress which she refused. She was not suicidal homicidal. She is agreeable to following up with cardiology in the outpatient setting and with her PCP but does not want inpatient stress test.   In the light of her clinical presentation outpatient stress testing does appear to be appropriate, will have her follow with PCP outpatient, we'll have social worker evaluate her and set her up for outpatient grief counseling. We'll place her on low-dose Klonopin for mild anxiety but she has been experiencing since her husband has passed away.      Today   Subjective:   Allison Franco today has no headache,no chest abdominal pain,no new weakness tingling or numbness, feels much better wants to go home today.   Objective:   Blood pressure 118/67, pulse 65, temperature 97.9 F (36.6 C), temperature source Oral, resp. rate 20, height 5\' 3"  (1.6 m), weight 85.866 kg (189 lb 4.8 oz), SpO2 98.00%.   Intake/Output Summary (Last 24 hours) at 10/21/13 1152 Last data filed at 10/20/13 1720  Gross per 24 hour  Intake    360 ml  Output      0 ml  Net    360 ml    Exam Awake Alert, Oriented *3, No new F.N deficits, Normal affect Orient.AT,PERRAL Supple Neck,No JVD, No cervical lymphadenopathy appriciated.  Symmetrical Chest wall movement, Good air movement bilaterally, CTAB RRR,No Gallops,Rubs or new Murmurs, No Parasternal Heave +ve B.Sounds, Abd Soft, Non tender, No organomegaly appriciated, No rebound -guarding or rigidity. No Cyanosis, Clubbing or edema, No  new Rash or bruise  Data Review   CBC w Diff: Lab Results  Component Value Date   WBC 7.9 10/21/2013   HGB 13.4 10/21/2013   HCT 41.1 10/21/2013   PLT 261 10/21/2013   LYMPHOPCT 29 02/29/2012   MONOPCT 7 02/29/2012   EOSPCT 4 02/29/2012   BASOPCT 1 02/29/2012    CMP: Lab Results  Component Value Date   NA 137 10/21/2013   K 4.4 10/21/2013   CL 105 10/21/2013   CO2 23 10/21/2013   BUN 14 10/21/2013   CREATININE 0.70 10/21/2013   PROT 7.1 06/14/2013   ALBUMIN 3.5 06/14/2013   BILITOT 0.5 06/14/2013   ALKPHOS 90 06/14/2013   AST 24 06/14/2013   ALT 25 06/14/2013  .   Total Time in preparing paper work, data evaluation and todays exam - 35 minutes  Leroy Sea M.D on 10/21/2013 at 11:52 AM  [facility] Office  [phone]

## 2013-10-21 NOTE — Care Management (Signed)
10-21-13 1317 CM did provide pt with resource number to [facility] to offer grief counseling. No further needs from CM at this time. Gala Lewandowsky, RN,BSN [phone]

## 2013-11-20 ENCOUNTER — Emergency Department (HOSPITAL_COMMUNITY)
Admission: EM | Admit: 2013-11-20 | Discharge: 2013-11-20 | Disposition: A | Discharge status: Home / Self Care | Payer: Medicare Other | Attending: Emergency Medicine | Admitting: Emergency Medicine

## 2013-11-20 ENCOUNTER — Encounter (HOSPITAL_COMMUNITY): Payer: Self-pay | Admitting: Emergency Medicine

## 2013-11-20 DIAGNOSIS — I10 Essential (primary) hypertension: Secondary | ICD-10-CM | POA: Insufficient documentation

## 2013-11-20 DIAGNOSIS — Z7982 Long term (current) use of aspirin: Secondary | ICD-10-CM | POA: Insufficient documentation

## 2013-11-20 DIAGNOSIS — R002 Palpitations: Secondary | ICD-10-CM | POA: Insufficient documentation

## 2013-11-20 DIAGNOSIS — Z79899 Other long term (current) drug therapy: Secondary | ICD-10-CM | POA: Insufficient documentation

## 2013-11-20 DIAGNOSIS — F4321 Adjustment disorder with depressed mood: Secondary | ICD-10-CM

## 2013-11-20 NOTE — ED Notes (Signed)
Pt sts SOB and "heart feeling funny" x 1 month.  Pt sts "I've had a hard time breathing and my chest has felt funny, since my husband died last month."  Denies pain.

## 2013-11-20 NOTE — ED Provider Notes (Signed)
CSN: [id number]     Arrival date & time 11/20/13  1433 History   First MD Initiated Contact with Patient 11/20/13 1523     Chief Complaint  Patient presents with  . Shortness of Breath  . Palpitations   (Consider location/radiation/quality/duration/timing/severity/associated sxs/prior Treatment) Patient is a 72 y.o. female presenting with shortness of breath and palpitations. The history is provided by the patient.  Shortness of Breath Palpitations Associated symptoms: shortness of breath    patient complains of increased depression and anxiety since her husband died approximately 2 months ago. Denies any anginal type chest pain. No suicidal homicidal ideations. Has used Klonopin in the past which does help her symptoms. No syncope or nursing. Denies any cough or congestion or fever. Has had palpitations when she feels more anxious. No other treatments used prior to arrival.  Past Medical History  Diagnosis Date  . Hypertension    Past Surgical History  Procedure Laterality Date  . Fribroid surgery    . Abdominal hysterectomy     History reviewed. No pertinent family history. History  Substance Use Topics  . Smoking status: Never Smoker   . Smokeless tobacco: Not on file  . Alcohol Use: No   OB History   Grav Para Term Preterm Abortions TAB SAB Ect Mult Living                 Review of Systems  Respiratory: Positive for shortness of breath.   Cardiovascular: Positive for palpitations.  All other systems reviewed and are negative.    Allergies  Review of patient's allergies indicates no known allergies.  Home Medications   Current Outpatient Rx  Name  Route  Sig  Dispense  Refill  . Ascorbic Acid (VITAMIN C PO)   Oral   Take 1 tablet by mouth daily.         [name] aspirin EC 81 MG tablet   Oral   Take 81 mg by mouth daily.           . clonazePAM (KLONOPIN) 0.5 MG tablet   Oral   Take 1 tablet (0.5 mg total) by mouth 2 (two) times daily.   30 tablet   0    . lisinopril (PRINIVIL,ZESTRIL) 10 MG tablet   Oral   Take 10 mg by mouth at bedtime.         . Multiple Vitamin (MULTI-VITAMIN DAILY PO)   Oral   Take 1 tablet by mouth daily.         . pantoprazole (PROTONIX) 40 MG tablet   Oral   Take 1 tablet (40 mg total) by mouth daily.   30 tablet   0   . traMADol (ULTRAM) 50 MG tablet   Oral   Take 50 mg by mouth every 6 (six) hours as needed for moderate pain.          BP 158/78  Pulse 82  Temp(Src) 98.8 F (37.1 C) (Oral)  Resp 16  SpO2 97% Physical Exam  Nursing note and vitals reviewed. Constitutional: She is oriented to person, place, and time. She appears well-developed and well-nourished.  Non-toxic appearance. No distress.  HENT:  Head: Normocephalic and atraumatic.  Eyes: Conjunctivae, EOM and lids are normal. Pupils are equal, round, and reactive to light.  Neck: Normal range of motion. Neck supple. No tracheal deviation present. No mass present.  Cardiovascular: Normal rate, regular rhythm and normal heart sounds.  Exam reveals no gallop.   No murmur heard. Pulmonary/Chest: Effort normal  and breath sounds normal. No stridor. No respiratory distress. She has no decreased breath sounds. She has no wheezes. She has no rhonchi. She has no rales.  Abdominal: Soft. Normal appearance and bowel sounds are normal. She exhibits no distension. There is no tenderness. There is no rebound and no CVA tenderness.  Musculoskeletal: Normal range of motion. She exhibits no edema and no tenderness.  Neurological: She is alert and oriented to person, place, and time. She has normal strength. No cranial nerve deficit or sensory deficit. GCS eye subscore is 4. GCS verbal subscore is 5. GCS motor subscore is 6.  Skin: Skin is warm and dry. No abrasion and no rash noted.  Psychiatric: Her speech is normal. Her affect is blunt. She is withdrawn.    ED Course  Procedures (including critical care time) Labs Review Labs Reviewed - No data  to display Imaging Review No results found.  EKG Interpretation    Date/Time:  Wednesday November 20 2013 14:46:26 EST Ventricular Rate:  86 PR Interval:  162 QRS Duration: 79 QT Interval:  344 QTC Calculation: 411 R Axis:   -15 Text Interpretation:  Sinus rhythm Probable left atrial enlargement Borderline left axis deviation Consider anterior infarct No significant change since last tracing Confirmed by   MD,  ([id number]) on 11/20/2013 3:56:45 PM            MDM  No diagnosis found. Patient without signs or symptoms concerning for ACS. Suspect that she has depression with some anxiety. She was encouraged to use her Klonopin as directed and I will give her behavior health resources.    Toy Baker, MD 11/20/13 [phone]

## 2013-12-06 DIAGNOSIS — M2550 Pain in unspecified joint: Secondary | ICD-10-CM | POA: Diagnosis not present

## 2013-12-06 DIAGNOSIS — R229 Localized swelling, mass and lump, unspecified: Secondary | ICD-10-CM | POA: Diagnosis not present

## 2013-12-06 DIAGNOSIS — K219 Gastro-esophageal reflux disease without esophagitis: Secondary | ICD-10-CM | POA: Diagnosis not present

## 2013-12-06 DIAGNOSIS — K5901 Slow transit constipation: Secondary | ICD-10-CM | POA: Diagnosis not present

## 2013-12-06 DIAGNOSIS — Z634 Disappearance and death of family member: Secondary | ICD-10-CM | POA: Diagnosis not present

## 2013-12-06 DIAGNOSIS — I10 Essential (primary) hypertension: Secondary | ICD-10-CM | POA: Diagnosis not present

## 2014-01-01 ENCOUNTER — Emergency Department (INDEPENDENT_AMBULATORY_CARE_PROVIDER_SITE_OTHER)
Admission: EM | Admit: 2014-01-01 | Discharge: 2014-01-01 | Disposition: A | Discharge status: Home / Self Care | Payer: Medicare Other | Source: Home / Self Care | Attending: Family Medicine | Admitting: Family Medicine

## 2014-01-01 ENCOUNTER — Encounter (HOSPITAL_COMMUNITY): Payer: Self-pay | Admitting: Emergency Medicine

## 2014-01-01 ENCOUNTER — Emergency Department (INDEPENDENT_AMBULATORY_CARE_PROVIDER_SITE_OTHER): Payer: Medicare Other

## 2014-01-01 DIAGNOSIS — J189 Pneumonia, unspecified organism: Secondary | ICD-10-CM

## 2014-01-01 DIAGNOSIS — R0602 Shortness of breath: Secondary | ICD-10-CM | POA: Diagnosis not present

## 2014-01-01 MED ORDER — LEVOFLOXACIN 500 MG PO TABS: 500.0000 mg | ORAL_TABLET | Freq: Every day | ORAL | Status: DC

## 2014-01-01 NOTE — Discharge Instructions (Signed)
Thank you for coming in today. Take Levaquin daily for 7 days.  Followup with your primary care provider in a few days Call or go to the emergency room if you get worse, have trouble breathing, have chest pains, or palpitations.    Pneumonia, Adult Pneumonia is an infection of the lungs.  CAUSES Pneumonia may be caused by bacteria or a virus. Usually, these infections are caused by breathing infectious particles into the lungs (respiratory tract). SYMPTOMS   Cough.  Fever.  Chest pain.  Increased rate of breathing.  Wheezing.  Mucus production. DIAGNOSIS  If you have the common symptoms of pneumonia, your caregiver will typically confirm the diagnosis with a chest X-ray. The X-ray will show an abnormality in the lung (pulmonary infiltrate) if you have pneumonia. Other tests of your blood, urine, or sputum may be done to find the specific cause of your pneumonia. Your caregiver may also do tests (blood gases or pulse oximetry) to see how well your lungs are working. TREATMENT  Some forms of pneumonia may be spread to other people when you cough or sneeze. You may be asked to wear a mask before and during your exam. Pneumonia that is caused by bacteria is treated with antibiotic medicine. Pneumonia that is caused by the influenza virus may be treated with an antiviral medicine. Most other viral infections must run their course. These infections will not respond to antibiotics.  PREVENTION A pneumococcal shot (vaccine) is available to prevent a common bacterial cause of pneumonia. This is usually suggested for:  People over 54 years old.  Patients on chemotherapy.  People with chronic lung problems, such as bronchitis or emphysema.  People with immune system problems. If you are over 65 or have a high risk condition, you may receive the pneumococcal vaccine if you have not received it before. In some countries, a routine influenza vaccine is also recommended. This vaccine can help  prevent some cases of pneumonia.You may be offered the influenza vaccine as part of your care. If you smoke, it is time to quit. You may receive instructions on how to stop smoking. Your caregiver can provide medicines and counseling to help you quit. HOME CARE INSTRUCTIONS   Cough suppressants may be used if you are losing too much rest. However, coughing protects you by clearing your lungs. You should avoid using cough suppressants if you can.  Your caregiver may have prescribed medicine if he or she thinks your pneumonia is caused by a bacteria or influenza. Finish your medicine even if you start to feel better.  Your caregiver may also prescribe an expectorant. This loosens the mucus to be coughed up.  Only take over-the-counter or prescription medicines for pain, discomfort, or fever as directed by your caregiver.  Do not smoke. Smoking is a common cause of bronchitis and can contribute to pneumonia. If you are a smoker and continue to smoke, your cough may last several weeks after your pneumonia has cleared.  A cold steam vaporizer or humidifier in your room or home may help loosen mucus.  Coughing is often worse at night. Sleeping in a semi-upright position in a recliner or using a couple pillows under your head will help with this.  Get rest as you feel it is needed. Your body will usually let you know when you need to rest. SEEK IMMEDIATE MEDICAL CARE IF:   Your illness becomes worse. This is especially true if you are elderly or weakened from any other disease.  You cannot  control your cough with suppressants and are losing sleep.  You begin coughing up blood.  You develop pain which is getting worse or is uncontrolled with medicines.  You have a fever.  Any of the symptoms which initially brought you in for treatment are getting worse rather than better.  You develop shortness of breath or chest pain. MAKE SURE YOU:   Understand these instructions.  Will watch your  condition.  Will get help right away if you are not doing well or get worse. Document Released: 11/14/2005 Document Revised: 02/06/2012 Document Reviewed: 02/03/2011 [facility] Patient Information 2014 [place], [place].

## 2014-01-01 NOTE — ED Notes (Signed)
C/O sob PAST FEW DAYS, GETTING WORSE. Pulse oximetry varies on assessment 93-95 % on room air

## 2014-01-01 NOTE — ED Provider Notes (Signed)
Allison Franco is a 73 y.o. female who presents to Urgent Care today for 2-3 days of shortness of breath. Patient notes mild shortness of breath. She denies any chest pains or palpitations. It seems to be slightly worsening the last few days. She denies any significant wheezing or coughing. She denies any significant fevers or chills nausea vomiting or diarrhea. She has not tried any medications yet. Symptoms are mildly bothersome.   Past Medical History  Diagnosis Date  . Hypertension    History  Substance Use Topics  . Smoking status: Never Smoker   . Smokeless tobacco: Not on file  . Alcohol Use: No   ROS as above Medications: No current facility-administered medications for this encounter.   Current Outpatient Prescriptions  Medication Sig Dispense Refill  . Ascorbic Acid (VITAMIN C PO) Take 1 tablet by mouth daily.      [name] aspirin EC 81 MG tablet Take 81 mg by mouth daily.        . clonazePAM (KLONOPIN) 0.5 MG tablet Take 1 tablet (0.5 mg total) by mouth 2 (two) times daily.  30 tablet  0  . lisinopril (PRINIVIL,ZESTRIL) 10 MG tablet Take 10 mg by mouth at bedtime.      . Multiple Vitamin (MULTI-VITAMIN DAILY PO) Take 1 tablet by mouth daily.      . pantoprazole (PROTONIX) 40 MG tablet Take 1 tablet (40 mg total) by mouth daily.  30 tablet  0  . traMADol (ULTRAM) 50 MG tablet Take 50 mg by mouth every 6 (six) hours as needed for moderate pain.        Exam:  Pulse 90  Temp(Src) 97.8 F (36.6 C) (Oral)  Resp 19  SpO2 96% Gen: Well NAD HEENT: EOMI,  MMM posterior pharynx with mild cobblestoning. Neck membranes are normal appearing bilaterally. No JVD Lungs: Normal work of breathing. CTABL Heart: RRR no MRG Abd: NABS, Soft. NT, ND Exts: Brisk capillary refill, warm and well perfused. No lower community edema noted.  Twelve-lead EKG shows normal sinus rhythm at 78 beats per minute. Possible mild right atrial and large but otherwise normal. Not significantly changed from prior  EKG. ST segment elevations or depressions.  No results found for this or any previous visit (from the past 24 hour(s)). Dg Chest 2 View  01/01/2014   CLINICAL DATA:  Dizziness. Shortness of breath.  EXAM: CHEST  2 VIEW  COMPARISON:  DG CHEST 2 VIEW dated 09/01/2013; DG CHEST 2 VIEW dated 06/14/2013; CT ABD/PELVIS W CM dated 02/29/2012  FINDINGS: Mildly enlarged cardiopericardial silhouette. New indistinct airspace opacity along the left hemidiaphragm has primarily bandlike configuration. Flattened hemidiaphragms on the lateral projection.  Mild thoracic spondylosis.  IMPRESSION: 1. Mild cardiomegaly, without edema. 2. Indistinct bandlike opacities at the left lung base, favoring atelectasis over pneumonia.   Electronically Signed   By: Sherryl Barters M.D.   On: 01/01/2014 19:06    Assessment and Plan: 73 y.o. female with community-acquired pneumonia. The patient has mild shortness of breath with chest x-ray changes consistent with pneumonia. Plan to treat with Levaquin antibiotics. Followup with primary care provider.  Discussed warning signs or symptoms. Please see discharge instructions. Patient expresses understanding.    Gregor Hams, MD 01/01/14 [phone]

## 2014-01-01 NOTE — ED Notes (Signed)
Delay in getting radiology studies done due to computer problem

## 2014-01-09 DIAGNOSIS — Z634 Disappearance and death of family member: Secondary | ICD-10-CM | POA: Diagnosis not present

## 2014-01-09 DIAGNOSIS — K5901 Slow transit constipation: Secondary | ICD-10-CM | POA: Diagnosis not present

## 2014-01-09 DIAGNOSIS — R229 Localized swelling, mass and lump, unspecified: Secondary | ICD-10-CM | POA: Diagnosis not present

## 2014-01-09 DIAGNOSIS — K219 Gastro-esophageal reflux disease without esophagitis: Secondary | ICD-10-CM | POA: Diagnosis not present

## 2014-01-09 DIAGNOSIS — I10 Essential (primary) hypertension: Secondary | ICD-10-CM | POA: Diagnosis not present

## 2014-01-09 DIAGNOSIS — M2550 Pain in unspecified joint: Secondary | ICD-10-CM | POA: Diagnosis not present

## 2014-02-06 DIAGNOSIS — K219 Gastro-esophageal reflux disease without esophagitis: Secondary | ICD-10-CM | POA: Diagnosis not present

## 2014-02-06 DIAGNOSIS — K5901 Slow transit constipation: Secondary | ICD-10-CM | POA: Diagnosis not present

## 2014-02-06 DIAGNOSIS — M2550 Pain in unspecified joint: Secondary | ICD-10-CM | POA: Diagnosis not present

## 2014-02-06 DIAGNOSIS — I10 Essential (primary) hypertension: Secondary | ICD-10-CM | POA: Diagnosis not present

## 2014-02-06 DIAGNOSIS — R229 Localized swelling, mass and lump, unspecified: Secondary | ICD-10-CM | POA: Diagnosis not present

## 2014-02-06 DIAGNOSIS — Z634 Disappearance and death of family member: Secondary | ICD-10-CM | POA: Diagnosis not present

## 2014-03-11 DIAGNOSIS — D235 Other benign neoplasm of skin of trunk: Secondary | ICD-10-CM | POA: Diagnosis not present

## 2014-03-11 DIAGNOSIS — D485 Neoplasm of uncertain behavior of skin: Secondary | ICD-10-CM | POA: Diagnosis not present

## 2014-03-11 DIAGNOSIS — M7130 Other bursal cyst, unspecified site: Secondary | ICD-10-CM | POA: Diagnosis not present

## 2014-03-19 ENCOUNTER — Encounter (HOSPITAL_COMMUNITY): Payer: Self-pay | Admitting: Emergency Medicine

## 2014-03-19 ENCOUNTER — Emergency Department (HOSPITAL_COMMUNITY): Payer: Medicare Other

## 2014-03-19 ENCOUNTER — Emergency Department (HOSPITAL_COMMUNITY)
Admission: EM | Admit: 2014-03-19 | Discharge: 2014-03-19 | Disposition: A | Discharge status: Home / Self Care | Payer: Medicare Other | Attending: Emergency Medicine | Admitting: Emergency Medicine

## 2014-03-19 DIAGNOSIS — R109 Unspecified abdominal pain: Secondary | ICD-10-CM

## 2014-03-19 DIAGNOSIS — I10 Essential (primary) hypertension: Secondary | ICD-10-CM | POA: Insufficient documentation

## 2014-03-19 DIAGNOSIS — K5900 Constipation, unspecified: Secondary | ICD-10-CM | POA: Diagnosis not present

## 2014-03-19 DIAGNOSIS — Z7982 Long term (current) use of aspirin: Secondary | ICD-10-CM | POA: Insufficient documentation

## 2014-03-19 DIAGNOSIS — Z79899 Other long term (current) drug therapy: Secondary | ICD-10-CM | POA: Diagnosis not present

## 2014-03-19 DIAGNOSIS — K461 Unspecified abdominal hernia with gangrene: Secondary | ICD-10-CM | POA: Diagnosis not present

## 2014-03-19 LAB — CBC WITH DIFFERENTIAL/PLATELET
Basophils Absolute: 0 10*3/uL (ref 0.0–0.1)
Basophils Relative: 1 % (ref 0–1)
EOS PCT: 2 % (ref 0–5)
Eosinophils Absolute: 0.2 10*3/uL (ref 0.0–0.7)
HEMATOCRIT: 42.5 % (ref 36.0–46.0)
HEMOGLOBIN: 13.8 g/dL (ref 12.0–15.0)
Lymphocytes Relative: 24 % (ref 12–46)
Lymphs Abs: 1.9 10*3/uL (ref 0.7–4.0)
MCH: 27.5 pg (ref 26.0–34.0)
MCHC: 32.5 g/dL (ref 30.0–36.0)
MCV: 84.8 fL (ref 78.0–100.0)
MONO ABS: 0.4 10*3/uL (ref 0.1–1.0)
Monocytes Relative: 6 % (ref 3–12)
Neutro Abs: 5.1 10*3/uL (ref 1.7–7.7)
Neutrophils Relative %: 67 % (ref 43–77)
Platelets: 271 10*3/uL (ref 150–400)
RBC: 5.01 MIL/uL (ref 3.87–5.11)
RDW: 12.9 % (ref 11.5–15.5)
WBC: 7.6 10*3/uL (ref 4.0–10.5)

## 2014-03-19 LAB — URINALYSIS, ROUTINE W REFLEX MICROSCOPIC
Bilirubin Urine: NEGATIVE
Glucose, UA: NEGATIVE mg/dL
HGB URINE DIPSTICK: NEGATIVE
KETONES UR: NEGATIVE mg/dL
Leukocytes, UA: NEGATIVE
Nitrite: NEGATIVE
PROTEIN: NEGATIVE mg/dL
Specific Gravity, Urine: 1.002 — ABNORMAL LOW (ref 1.005–1.030)
Urobilinogen, UA: 0.2 mg/dL (ref 0.0–1.0)
pH: 7.5 (ref 5.0–8.0)

## 2014-03-19 LAB — COMPREHENSIVE METABOLIC PANEL
ALT: 30 U/L (ref 0–35)
AST: 25 U/L (ref 0–37)
Albumin: 3.7 g/dL (ref 3.5–5.2)
Alkaline Phosphatase: 99 U/L (ref 39–117)
BILIRUBIN TOTAL: 0.5 mg/dL (ref 0.3–1.2)
BUN: 15 mg/dL (ref 6–23)
CALCIUM: 9.9 mg/dL (ref 8.4–10.5)
CHLORIDE: 95 meq/L — AB (ref 96–112)
CO2: 29 mEq/L (ref 19–32)
Creatinine, Ser: 0.84 mg/dL (ref 0.50–1.10)
GFR calc Af Amer: 79 mL/min — ABNORMAL LOW (ref >90)
GFR, EST NON AFRICAN AMERICAN: 68 mL/min — AB (ref >90)
GLUCOSE: 97 mg/dL (ref 70–99)
Potassium: 3.9 mEq/L (ref 3.7–5.3)
Sodium: 134 mEq/L — ABNORMAL LOW (ref 137–147)
Total Protein: 7.9 g/dL (ref 6.0–8.3)

## 2014-03-19 LAB — LIPASE, BLOOD: Lipase: 22 U/L (ref 11–59)

## 2014-03-19 MED ORDER — MORPHINE SULFATE 4 MG/ML IJ SOLN
4.0000 mg | Freq: Once | INTRAMUSCULAR | Status: AC
  Administered 2014-03-19: 4 mg via INTRAVENOUS
  Filled 2014-03-19: qty 1

## 2014-03-19 MED ORDER — IOHEXOL 300 MG/ML  SOLN
100.0000 mL | Freq: Once | INTRAMUSCULAR | Status: AC | PRN
  Administered 2014-03-19: 100 mL via INTRAVENOUS

## 2014-03-19 MED ORDER — IOHEXOL 300 MG/ML  SOLN
50.0000 mL | Freq: Once | INTRAMUSCULAR | Status: AC | PRN
  Administered 2014-03-19: 50 mL via ORAL

## 2014-03-19 MED ORDER — ONDANSETRON HCL 4 MG/2ML IJ SOLN
4.0000 mg | Freq: Once | INTRAMUSCULAR | Status: AC
  Administered 2014-03-19: 4 mg via INTRAVENOUS
  Filled 2014-03-19: qty 2

## 2014-03-19 MED ORDER — SODIUM CHLORIDE 0.9 % IV BOLUS (SEPSIS)
1000.0000 mL | Freq: Once | INTRAVENOUS | Status: AC
  Administered 2014-03-19: 1000 mL via INTRAVENOUS

## 2014-03-19 NOTE — ED Notes (Signed)
Pt c/o RLQ abdominal pain x 5 days.  Pain score 7/10.  Denies n/v/d.  Hx of constipation and reports having to take a laxative yesterday.

## 2014-03-19 NOTE — ED Provider Notes (Signed)
TIME SEEN: 12:12 PM  CHIEF COMPLAINT: Right-sided abdominal pain and flank pain  HPI: Patient is a 73 year old female with a history of hypertension who presents emergency department with right-sided abdominal pain and right flank pain. She states her pain started 5 days ago. She is unable to characterize her pain. She states it improves at home with her tramadol. She denies any aggravating factors. She denies any fevers, chills, vomiting or diarrhea. She has had nausea. She reports she also has chronic constipation and took magnesium citrate and a suppository yesterday and had a bowel movement. She has been passing gas. She denies dysuria, hematuria, vaginal bleeding or discharge. She is status post hysterectomy. She denies ever having similar pain in the past. No chest pain or shortness of breath. No cough. No history of kidney stones. No history of injury to her back. No numbness, tingling or focal weakness. No bowel or bladder incontinence.  ROS: See HPI Constitutional: no fever  Eyes: no drainage  ENT: no runny nose   Cardiovascular:  no chest pain  Resp: no SOB  GI: no vomiting GU: no dysuria Integumentary: no rash  Allergy: no hives  Musculoskeletal: no leg swelling  Neurological: no slurred speech ROS otherwise negative  PAST MEDICAL HISTORY/PAST SURGICAL HISTORY:  Past Medical History  Diagnosis Date  . Hypertension     MEDICATIONS:  Prior to Admission medications   Medication Sig Start Date End Date Taking? Authorizing Provider  aspirin EC 81 MG tablet Take 81 mg by mouth daily.     Yes Historical Provider, MD  clonazePAM (KLONOPIN) 0.5 MG tablet Take 1 tablet (0.5 mg total) by mouth 2 (two) times daily. 10/21/13  Yes Thurnell Lose, MD  lisinopril (PRINIVIL,ZESTRIL) 10 MG tablet Take 10 mg by mouth at bedtime.   Yes Historical Provider, MD  Multiple Vitamin (MULTI-VITAMIN DAILY PO) Take 1 tablet by mouth daily.   Yes Historical Provider, MD  traMADol (ULTRAM) 50 MG  tablet Take 50 mg by mouth every 6 (six) hours as needed for moderate pain.   Yes Historical Provider, MD    ALLERGIES:  No Known Allergies  SOCIAL HISTORY:  History  Substance Use Topics  . Smoking status: Never Smoker   . Smokeless tobacco: Not on file  . Alcohol Use: No    FAMILY HISTORY: History reviewed. No pertinent family history.  EXAM: BP 144/78  Pulse 83  Temp(Src) 98.2 F (36.8 C) (Oral)  Resp 20  SpO2 98% CONSTITUTIONAL: Alert and oriented and responds appropriately to questions. Well-appearing; well-nourished HEAD: Normocephalic EYES: Conjunctivae clear, PERRL ENT: normal nose; no rhinorrhea; moist mucous membranes; pharynx without lesions noted NECK: Supple, no meningismus, no LAD  CARD: RRR; S1 and S2 appreciated; no murmurs, no clicks, no rubs, no gallops RESP: Normal chest excursion without splinting or tachypnea; breath sounds clear and equal bilaterally; no wheezes, no rhonchi, no rales,  ABD/GI: Normal bowel sounds; non-distended; soft, tender to palpation in the right flank and right upper abdomen diffusely with mild voluntary guarding, negative Murphy sign, no peritoneal signs BACK:  The back appears normal and is non-tender to palpation, there is no CVA tenderness, no midline spinal tenderness or step-off or deformity EXT: Normal ROM in all joints; non-tender to palpation; no edema; normal capillary refill; no cyanosis    SKIN: Normal color for age and race; warm NEURO: Moves all extremities equally PSYCH: The patient's mood and manner are appropriate. Grooming and personal hygiene are appropriate.  MEDICAL DECISION MAKING: Patient here with  right-sided abdominal pain for the past 5 days. She is well-appearing, nontoxic and hemodynamically stable. Differential diagnosis includes musculoskeletal pain, kidney stone, pyelonephritis, colitis, appendicitis, constipation. Will obtain abdominal labs, urine and abdominal x-ray. We'll give pain and nausea medicine  and IV fluids.  ED PROGRESS: Patient's labs and urine are unremarkable. Her abdominal x-ray shows no bowel dilation but there are multiple air-fluid levels that may be consistent with ileus versus enteritis. Instructions for to be less likely. Given her age, comorbidities and tenderness on exam, will proceed with CT of her abdomen and pelvis.   2:37 Pm  Pt's CT scan shows no sign of obstruction or other acute abdominal or pelvic pathology. She has a small fat containing left inguinal hernia. She is nontender to palpation in this area. She reports she is feeling much better. Discussed with patient return precautions and supportive care instructions. Pain may be secondary to ileus versus constipation. Have instructed her to continue a bowel regimen. Patient verbalizes understanding and is comfortable with this plan.    [place], DO 03/19/14 1438

## 2014-03-19 NOTE — Discharge Instructions (Signed)
Abdominal Pain, Women °Abdominal (stomach, pelvic, or belly) pain can be caused by many things. It is important to tell your doctor: °· The location of the pain. °· Does it come and go or is it present all the time? °· Are there things that start the pain (eating certain foods, exercise)? °· Are there other symptoms associated with the pain (fever, nausea, vomiting, diarrhea)? °All of this is helpful to know when trying to find the cause of the pain. °CAUSES  °· Stomach: virus or bacteria infection, or ulcer. °· Intestine: appendicitis (inflamed appendix), regional ileitis (Crohn's disease), ulcerative colitis (inflamed colon), irritable bowel syndrome, diverticulitis (inflamed diverticulum of the colon), or cancer of the stomach or intestine. °· Gallbladder disease or stones in the gallbladder. °· Kidney disease, kidney stones, or infection. °· Pancreas infection or cancer. °· Fibromyalgia (pain disorder). °· Diseases of the female organs: °· Uterus: fibroid (non-cancerous) tumors or infection. °· Fallopian tubes: infection or tubal pregnancy. °· Ovary: cysts or tumors. °· Pelvic adhesions (scar tissue). °· Endometriosis (uterus lining tissue growing in the pelvis and on the pelvic organs). °· Pelvic congestion syndrome (female organs filling up with blood just before the menstrual period). °· Pain with the menstrual period. °· Pain with ovulation (producing an egg). °· Pain with an IUD (intrauterine device, birth control) in the uterus. °· Cancer of the female organs. °· Functional pain (pain not caused by a disease, may improve without treatment). °· Psychological pain. °· Depression. °DIAGNOSIS  °Your doctor will decide the seriousness of your pain by doing an examination. °· Blood tests. °· X-rays. °· Ultrasound. °· CT scan (computed tomography, special type of X-ray). °· MRI (magnetic resonance imaging). °· Cultures, for infection. °· Barium enema (dye inserted in the large intestine, to better view it with  X-rays). °· Colonoscopy (looking in intestine with a lighted tube). °· Laparoscopy (minor surgery, looking in abdomen with a lighted tube). °· Major abdominal exploratory surgery (looking in abdomen with a large incision). °TREATMENT  °The treatment will depend on the cause of the pain.  °· Many cases can be observed and treated at home. °· Over-the-counter medicines recommended by your caregiver. °· Prescription medicine. °· Antibiotics, for infection. °· Birth control pills, for painful periods or for ovulation pain. °· Hormone treatment, for endometriosis. °· Nerve blocking injections. °· Physical therapy. °· Antidepressants. °· Counseling with a psychologist or psychiatrist. °· Minor or major surgery. °HOME CARE INSTRUCTIONS  °· Do not take laxatives, unless directed by your caregiver. °· Take over-the-counter pain medicine only if ordered by your caregiver. Do not take aspirin because it can cause an upset stomach or bleeding. °· Try a clear liquid diet (broth or water) as ordered by your caregiver. Slowly move to a bland diet, as tolerated, if the pain is related to the stomach or intestine. °· Have a thermometer and take your temperature several times a day, and record it. °· Bed rest and sleep, if it helps the pain. °· Avoid sexual intercourse, if it causes pain. °· Avoid stressful situations. °· Keep your follow-up appointments and tests, as your caregiver orders. °· If the pain does not go away with medicine or surgery, you may try: °· Acupuncture. °· Relaxation exercises (yoga, meditation). °· Group therapy. °· Counseling. °SEEK MEDICAL CARE IF:  °· You notice certain foods cause stomach pain. °· Your home care treatment is not helping your pain. °· You need stronger pain medicine. °· You want your IUD removed. °· You feel faint or   lightheaded.  You develop nausea and vomiting.  You develop a rash.  You are having side effects or an allergy to your medicine. SEEK IMMEDIATE MEDICAL CARE IF:   Your  pain does not go away or gets worse.  You have a fever.  Your pain is felt only in portions of the abdomen. The right side could possibly be appendicitis. The left lower portion of the abdomen could be colitis or diverticulitis.  You are passing blood in your stools (bright red or black tarry stools, with or without vomiting).  You have blood in your urine.  You develop chills, with or without a fever.  You pass out. MAKE SURE YOU:   Understand these instructions.  Will watch your condition.  Will get help right away if you are not doing well or get worse. Document Released: 09/11/2007 Document Revised: 02/06/2012 Document Reviewed: 10/01/2009 [facility] Patient Information 2014 [place], [place].  Constipation, Adult Constipation is when a person has fewer than 3 bowel movements a week; has difficulty having a bowel movement; or has stools that are dry, hard, or larger than normal. As people grow older, constipation is more common. If you try to fix constipation with medicines that make you have a bowel movement (laxatives), the problem may get worse. Long-term laxative use may cause the muscles of the colon to become weak. A low-fiber diet, not taking in enough fluids, and taking certain medicines may make constipation worse. CAUSES   Certain medicines, such as antidepressants, pain medicine, iron supplements, antacids, and water pills.   Certain diseases, such as diabetes, irritable bowel syndrome (IBS), thyroid disease, or depression.   Not drinking enough water.   Not eating enough fiber-rich foods.   Stress or travel.  Lack of physical activity or exercise.  Not going to the restroom when there is the urge to have a bowel movement.  Ignoring the urge to have a bowel movement.  Using laxatives too much. SYMPTOMS   Having fewer than 3 bowel movements a week.   Straining to have a bowel movement.   Having hard, dry, or larger than normal stools.   Feeling  full or bloated.   Pain in the lower abdomen.  Not feeling relief after having a bowel movement. DIAGNOSIS  Your caregiver will take a medical history and perform a physical exam. Further testing may be done for severe constipation. Some tests may include:   A barium enema X-ray to examine your rectum, colon, and sometimes, your small intestine.  A sigmoidoscopy to examine your lower colon.  A colonoscopy to examine your entire colon. TREATMENT  Treatment will depend on the severity of your constipation and what is causing it. Some dietary treatments include drinking more fluids and eating more fiber-rich foods. Lifestyle treatments may include regular exercise. If these diet and lifestyle recommendations do not help, your caregiver may recommend taking over-the-counter laxative medicines to help you have bowel movements. Prescription medicines may be prescribed if over-the-counter medicines do not work.  HOME CARE INSTRUCTIONS   Increase dietary fiber in your diet, such as fruits, vegetables, whole grains, and beans. Limit high-fat and processed sugars in your diet, such as [place] fries, hamburgers, cookies, candies, and soda.   A fiber supplement may be added to your diet if you cannot get enough fiber from foods.   Drink enough fluids to keep your urine clear or pale yellow.   Exercise regularly or as directed by your caregiver.   Go to the restroom when you have  the urge to go. Do not hold it.  Only take medicines as directed by your caregiver. Do not take other medicines for constipation without talking to your caregiver first. [place] IF:   You have bright red blood in your stool.   Your constipation lasts for more than 4 days or gets worse.   You have abdominal or rectal pain.   You have thin, pencil-like stools.  You have unexplained weight loss. MAKE SURE YOU:   Understand these instructions.  Will watch your condition.  Will get help  right away if you are not doing well or get worse. Document Released: 08/12/2004 Document Revised: 02/06/2012 Document Reviewed: 08/26/2013 [facility] Patient Information 2014 [place], [place].  Fiber Content in Foods Drinking plenty of fluids and consuming foods high in fiber can help with constipation. See the list below for the fiber content of some common foods. Starches and Grains / Dietary Fiber (g)  Cheerios, 1 cup / 3 g  Kellogg's Corn Flakes, 1 cup / 0.7 g  Rice Krispies, 1  cup / 0.3 g  Quaker Oat Life Cereal,  cup / 2.1 g  Oatmeal, instant (cooked),  cup / 2 g  Kellogg's Frosted Mini Wheats, 1 cup / 5.1 g  Rice, brown, long-grain (cooked), 1 cup / 3.5 g  Rice, white, long-grain (cooked), 1 cup / 0.6 g  Macaroni, cooked, enriched, 1 cup / 2.5 g Legumes / Dietary Fiber (g)  Beans, baked, canned, plain or vegetarian,  cup / 5.2 g  Beans, kidney, canned,  cup / 6.8 g  Beans, pinto, dried (cooked),  cup / 7.7 g  Beans, pinto, canned,  cup / 5.5 g Breads and Crackers / Dietary Fiber (g)  Graham crackers, plain or honey, 2 squares / 0.7 g  Saltine crackers, 3 squares / 0.3 g  Pretzels, plain, salted, 10 pieces / 1.8 g  Bread, whole-wheat, 1 slice / 1.9 g  Bread, white, 1 slice / 0.7 g  Bread, raisin, 1 slice / 1.2 g  Bagel, plain, 3 oz / 2 g  Tortilla, flour, 1 oz / 0.9 g  Tortilla, corn, 1 small / 1.5 g  Bun, hamburger or hotdog, 1 small / 0.9 g Fruits / Dietary Fiber (g)  Apple, raw with skin, 1 medium / 4.4 g  Applesauce, sweetened,  cup / 1.5 g  Banana,  medium / 1.5 g  Grapes, 10 grapes / 0.4 g  Orange, 1 small / 2.3 g  Raisin, 1.5 oz / 1.6 g  Melon, 1 cup / 1.4 g Vegetables / Dietary Fiber (g)  Green beans, canned,  cup / 1.3 g  Carrots (cooked),  cup / 2.3 g  Broccoli (cooked),  cup / 2.8 g  Peas, frozen (cooked),  cup / 4.4 g  Potatoes, mashed,  cup / 1.6 g  Lettuce, 1 cup / 0.5 g  Corn, canned,  cup / 1.6  g  Tomato,  cup / 1.1 g Document Released: 04/02/2007 Document Revised: 02/06/2012 Document Reviewed: 05/28/2007 [employer] Patient Information 2014 [place], [place].   I recommend that you start taking MiraLAX once a day and Colace 100 mg twice a day. Both of these are found over-the-counter. You may also use fleets enemas and magnesium citrate as needed for constipation.  Please continue to drink enough water and eat foods high in fiber. Your labs, urine, CT scan today were normal.

## 2014-04-24 DIAGNOSIS — E559 Vitamin D deficiency, unspecified: Secondary | ICD-10-CM | POA: Diagnosis not present

## 2014-04-24 DIAGNOSIS — E039 Hypothyroidism, unspecified: Secondary | ICD-10-CM | POA: Diagnosis not present

## 2014-04-24 DIAGNOSIS — E785 Hyperlipidemia, unspecified: Secondary | ICD-10-CM | POA: Diagnosis not present

## 2014-04-24 DIAGNOSIS — R229 Localized swelling, mass and lump, unspecified: Secondary | ICD-10-CM | POA: Diagnosis not present

## 2014-04-24 DIAGNOSIS — I10 Essential (primary) hypertension: Secondary | ICD-10-CM | POA: Diagnosis not present

## 2014-04-24 DIAGNOSIS — E119 Type 2 diabetes mellitus without complications: Secondary | ICD-10-CM | POA: Diagnosis not present

## 2014-04-24 DIAGNOSIS — M2550 Pain in unspecified joint: Secondary | ICD-10-CM | POA: Diagnosis not present

## 2014-04-24 DIAGNOSIS — D508 Other iron deficiency anemias: Secondary | ICD-10-CM | POA: Diagnosis not present

## 2014-04-24 DIAGNOSIS — N390 Urinary tract infection, site not specified: Secondary | ICD-10-CM | POA: Diagnosis not present

## 2014-04-24 DIAGNOSIS — Z79899 Other long term (current) drug therapy: Secondary | ICD-10-CM | POA: Diagnosis not present

## 2014-04-24 DIAGNOSIS — K219 Gastro-esophageal reflux disease without esophagitis: Secondary | ICD-10-CM | POA: Diagnosis not present

## 2014-04-24 DIAGNOSIS — E538 Deficiency of other specified B group vitamins: Secondary | ICD-10-CM | POA: Diagnosis not present

## 2014-04-24 DIAGNOSIS — K5901 Slow transit constipation: Secondary | ICD-10-CM | POA: Diagnosis not present

## 2014-04-24 DIAGNOSIS — Z634 Disappearance and death of family member: Secondary | ICD-10-CM | POA: Diagnosis not present

## 2014-05-21 ENCOUNTER — Emergency Department (INDEPENDENT_AMBULATORY_CARE_PROVIDER_SITE_OTHER)
Admission: EM | Admit: 2014-05-21 | Discharge: 2014-05-21 | Disposition: A | Discharge status: Home / Self Care | Payer: Medicare Other | Source: Home / Self Care | Attending: Emergency Medicine | Admitting: Emergency Medicine

## 2014-05-21 ENCOUNTER — Encounter (HOSPITAL_COMMUNITY): Payer: Self-pay | Admitting: Emergency Medicine

## 2014-05-21 DIAGNOSIS — H9319 Tinnitus, unspecified ear: Secondary | ICD-10-CM

## 2014-05-21 DIAGNOSIS — H9313 Tinnitus, bilateral: Secondary | ICD-10-CM

## 2014-05-21 MED ORDER — MECLIZINE HCL 50 MG PO TABS: 25.0000 mg | ORAL_TABLET | Freq: Three times a day (TID) | ORAL | Status: DC | PRN

## 2014-05-21 NOTE — ED Notes (Signed)
Right ear pain, onset yesterday afternoon

## 2014-05-21 NOTE — ED Provider Notes (Signed)
CSN: [id number]     Arrival date & time 05/21/14  1318 History   First MD Initiated Contact with Patient 05/21/14 1511     Chief Complaint  Patient presents with  . Otalgia   (Consider location/radiation/quality/duration/timing/severity/associated sxs/prior Treatment) Patient is a 73 y.o. female presenting with ear pain. The history is provided by the patient. No language interpreter was used.  Otalgia Location:  Bilateral Quality: ringing.  sound. Severity:  No pain Duration:  1 day Timing:  Constant Progression:  Worsening Chronicity:  New Relieved by:  Nothing Worsened by:  Nothing tried Ineffective treatments:  None tried Associated symptoms: ear discharge   Risk factors: no recent travel   Pt complains of an echo in her ear/ ringing.   Pt reports both ears.   Pt recently increased from one asa to 2 a day  Past Medical History  Diagnosis Date  . Hypertension    Past Surgical History  Procedure Laterality Date  . Fribroid surgery    . Abdominal hysterectomy     No family history on file. History  Substance Use Topics  . Smoking status: Never Smoker   . Smokeless tobacco: Not on file  . Alcohol Use: No   OB History   Grav Para Term Preterm Abortions TAB SAB Ect Mult Living                 Review of Systems  HENT: Positive for ear discharge and ear pain.   All other systems reviewed and are negative.   Allergies  Review of patient's allergies indicates no known allergies.  Home Medications   Prior to Admission medications   Medication Sig Start Date End Date Taking? Authorizing Provider  aspirin EC 81 MG tablet Take 81 mg by mouth daily.     Yes Historical Provider, MD  clonazePAM (KLONOPIN) 0.5 MG tablet Take 1 tablet (0.5 mg total) by mouth 2 (two) times daily. 10/21/13  Yes Thurnell Lose, MD  lisinopril (PRINIVIL,ZESTRIL) 10 MG tablet Take 10 mg by mouth at bedtime.   Yes Historical Provider, MD  Multiple Vitamin (MULTI-VITAMIN DAILY PO) Take 1  tablet by mouth daily.    Historical Provider, MD  traMADol (ULTRAM) 50 MG tablet Take 50 mg by mouth every 6 (six) hours as needed for moderate pain.    Historical Provider, MD   BP 154/83  Pulse 90  Temp(Src) 97.1 F (36.2 C) (Oral)  Resp 18  SpO2 97% Physical Exam  Nursing note and vitals reviewed. Constitutional: She is oriented to person, place, and time.  HENT:  Head: Normocephalic and atraumatic.  Right and left tm clear, no erythema   Eyes: Pupils are equal, round, and reactive to light.  Neck: Normal range of motion.  Cardiovascular: Normal rate.   Pulmonary/Chest: Effort normal.  Abdominal: Soft.  Musculoskeletal: Normal range of motion.  Neurological: She is alert and oriented to person, place, and time. She has normal reflexes.  Skin: Skin is warm.  Psychiatric: She has a normal mood and affect.    ED Course  Procedures (including critical care time) Labs Review Labs Reviewed - No data to display  Imaging Review No results found.   MDM   1. Tinnitus, bilateral    I suspect ringing may be from asa.   I advised drop back to one asa a day. I will try meclizine to help.   Pt advised to see    Fransico Meadow, PA-C 05/21/14 1615

## 2014-05-21 NOTE — Discharge Instructions (Signed)
Tinnitus °Sounds you hear in your ears and coming from within the ear is called tinnitus. This can be a symptom of many ear disorders. It is often associated with hearing loss.  °Tinnitus can be seen with: °· Infections. °· Ear blockages such as wax buildup. °· Meniere's disease. °· Ear damage. °· Inherited. °· Occupational causes. °While irritating, it is not usually a threat to health. When the cause of the tinnitus is wax, infection in the middle ear, or foreign body it is easily treated. Hearing loss will usually be reversible.  °TREATMENT  °When treating the underlying cause does not get rid of tinnitus, it may be necessary to get rid of the unwanted sound by covering it up with more pleasant background noises. This may include music, the radio etc. There are tinnitus maskers which can be worn which produce background noise to cover up the tinnitus. °Avoid all medications which tend to make tinnitus worse such as alcohol, caffeine, aspirin, and nicotine. There are many soothing background tapes such as rain, ocean, thunderstorms, etc. These soothing sounds help with sleeping or resting. °Keep all follow-up appointments and referrals. This is important to identify the cause of the problem. It also helps avoid complications, impaired hearing, disability, or chronic pain. °Document Released: 11/14/2005 Document Revised: 02/06/2012 Document Reviewed: 07/02/2008 °[employer]® Patient Information ©2015 [employer]. This information is not intended to replace advice given to you by your health care provider. Make sure you discuss any questions you have with your health care provider. ° °

## 2014-05-22 DIAGNOSIS — K219 Gastro-esophageal reflux disease without esophagitis: Secondary | ICD-10-CM | POA: Diagnosis not present

## 2014-05-22 DIAGNOSIS — H6980 Other specified disorders of Eustachian tube, unspecified ear: Secondary | ICD-10-CM | POA: Diagnosis not present

## 2014-05-22 DIAGNOSIS — R229 Localized swelling, mass and lump, unspecified: Secondary | ICD-10-CM | POA: Diagnosis not present

## 2014-05-22 DIAGNOSIS — K5901 Slow transit constipation: Secondary | ICD-10-CM | POA: Diagnosis not present

## 2014-05-22 NOTE — ED Provider Notes (Signed)
Medical screening examination/treatment/procedure(s) were performed by a resident physician or non-physician practitioner and as the supervising physician I was immediately available for consultation/collaboration.  Lynne Leader, MD    Gregor Hams, MD 05/22/14 [phone]

## 2014-06-23 DIAGNOSIS — Z634 Disappearance and death of family member: Secondary | ICD-10-CM | POA: Diagnosis not present

## 2014-06-23 DIAGNOSIS — K5901 Slow transit constipation: Secondary | ICD-10-CM | POA: Diagnosis not present

## 2014-06-23 DIAGNOSIS — I10 Essential (primary) hypertension: Secondary | ICD-10-CM | POA: Diagnosis not present

## 2014-06-23 DIAGNOSIS — K219 Gastro-esophageal reflux disease without esophagitis: Secondary | ICD-10-CM | POA: Diagnosis not present

## 2014-06-23 DIAGNOSIS — R7309 Other abnormal glucose: Secondary | ICD-10-CM | POA: Diagnosis not present

## 2014-06-23 DIAGNOSIS — M2550 Pain in unspecified joint: Secondary | ICD-10-CM | POA: Diagnosis not present

## 2014-07-10 DIAGNOSIS — H9209 Otalgia, unspecified ear: Secondary | ICD-10-CM | POA: Diagnosis not present

## 2014-07-11 ENCOUNTER — Encounter (HOSPITAL_COMMUNITY): Payer: Self-pay | Admitting: Emergency Medicine

## 2014-07-11 ENCOUNTER — Emergency Department (HOSPITAL_COMMUNITY)
Admission: EM | Admit: 2014-07-11 | Discharge: 2014-07-11 | Disposition: A | Discharge status: Home / Self Care | Payer: Medicare Other | Attending: Emergency Medicine | Admitting: Emergency Medicine

## 2014-07-11 DIAGNOSIS — I10 Essential (primary) hypertension: Secondary | ICD-10-CM | POA: Diagnosis not present

## 2014-07-11 DIAGNOSIS — Z7982 Long term (current) use of aspirin: Secondary | ICD-10-CM | POA: Insufficient documentation

## 2014-07-11 DIAGNOSIS — H9319 Tinnitus, unspecified ear: Secondary | ICD-10-CM | POA: Diagnosis not present

## 2014-07-11 DIAGNOSIS — Z79899 Other long term (current) drug therapy: Secondary | ICD-10-CM | POA: Insufficient documentation

## 2014-07-11 DIAGNOSIS — H9313 Tinnitus, bilateral: Secondary | ICD-10-CM

## 2014-07-11 LAB — SALICYLATE LEVEL: Salicylate Lvl: <2 mg/dL — ABNORMAL LOW (ref 2.8–20.0)

## 2014-07-11 NOTE — ED Provider Notes (Signed)
Medical screening examination/treatment/procedure(s) were performed by non-physician practitioner and as supervising physician I was immediately available for consultation/collaboration.   EKG Interpretation None        Wynetta Fines, MD 07/11/14 [phone]

## 2014-07-11 NOTE — ED Provider Notes (Signed)
CSN: [id number]     Arrival date & time 07/11/14  0013 History   First MD Initiated Contact with Patient 07/11/14 0243     Chief Complaint  Patient presents with  . Buzzing in ears      (Consider location/radiation/quality/duration/timing/severity/associated sxs/prior Treatment) HPI Comments: Patient is a 73 year old female with a history of hypertension who presents to the emergency department complaining of an "echo" in her ears bilaterally. Patient states that symptoms worsened this evening. She states that symptoms are aggravated when she is supine. She states "when my mind is not on it, it goes away". Patient denies associated nasal congestion, rhinorrhea, fever, dizziness, lightheadedness, head injury or trauma, nausea or vomiting, and a history of cancer or strokes. Patient states that she takes 1 baby aspirin daily. She states she has been using Flonase for her symptoms which provides her mild relief.  The history is provided by the patient. No language interpreter was used.    Past Medical History  Diagnosis Date  . Hypertension    Past Surgical History  Procedure Laterality Date  . Fribroid surgery    . Abdominal hysterectomy     No family history on file. History  Substance Use Topics  . Smoking status: Never Smoker   . Smokeless tobacco: Not on file  . Alcohol Use: No   OB History   Grav Para Term Preterm Abortions TAB SAB Ect Mult Living                  Review of Systems  HENT:       +b/l tinnitus  All other systems reviewed and are negative.    Allergies  Review of patient's allergies indicates no known allergies.  Home Medications   Prior to Admission medications   Medication Sig Start Date End Date Taking? Authorizing Provider  aspirin EC 81 MG tablet Take 81 mg by mouth daily.     Yes Historical Provider, MD  clonazePAM (KLONOPIN) 0.5 MG tablet Take 1 tablet (0.5 mg total) by mouth 2 (two) times daily. 10/21/13  Yes Thurnell Lose, MD   lisinopril (PRINIVIL,ZESTRIL) 10 MG tablet Take 10 mg by mouth at bedtime.   Yes Historical Provider, MD  Multiple Vitamin (MULTI-VITAMIN DAILY PO) Take 1 tablet by mouth daily.   Yes Historical Provider, MD  traMADol (ULTRAM) 50 MG tablet Take 50 mg by mouth every 6 (six) hours as needed for moderate pain.   Yes Historical Provider, MD   BP 127/73  Pulse 74  Temp(Src) 98.1 F (36.7 C) (Oral)  Resp 14  SpO2 100%  Physical Exam  Nursing note and vitals reviewed. Constitutional: She is oriented to person, place, and time. She appears well-developed and well-nourished. No distress.  Nontoxic/nonseptic appearing  HENT:  Head: Normocephalic and atraumatic.  Right Ear: Hearing, tympanic membrane, external ear and ear canal normal.  Left Ear: Hearing, tympanic membrane, external ear and ear canal normal.  Mouth/Throat: Oropharynx is clear and moist. No oropharyngeal exudate.  Unremarkable otoscopic exam. TMs without bulging, retraction, or perforation.  Eyes: Conjunctivae and EOM are normal. Pupils are equal, round, and reactive to light. No scleral icterus.  Neck: Normal range of motion. Neck supple.  Pulmonary/Chest: Effort normal. No respiratory distress.  Musculoskeletal: Normal range of motion.  Neurological: She is alert and oriented to person, place, and time. GCS eye subscore is 4. GCS verbal subscore is 5. GCS motor subscore is 6.  GCS 15. Speech is goal oriented. No focal neurologic deficits  appreciated. Patient moves extremities without ataxia. She ambulates with normal, steady gait.  Skin: Skin is warm and dry. No rash noted. She is not diaphoretic. No erythema. No pallor.  Psychiatric: She has a normal mood and affect. Her behavior is normal.    ED Course  Procedures (including critical care time) Labs Review Labs Reviewed  SALICYLATE LEVEL - Abnormal; Notable for the following:    Salicylate Lvl <1.0 (*)    All other components within normal limits    Imaging  Review No results found.   EKG Interpretation None      MDM   Final diagnoses:  Tinnitus, bilateral    Patient is a 73 year old female who presents to the emergency department for complaint of an "echo" in her bilateral ears. Patient states that she has been having symptoms intermittently over the past 3 months. She states that most recently her symptoms have been persistent over the last week. Patient was evaluated for a similar complaint at urgent care in June at which time she was told to decrease her daily aspirin from 2 tablets to 1 tablet daily. Patient states that she has been taking only 1 tablet of aspirin a day.  Neurologic exam is nonfocal today. Patient has no other complaints. No associated lightheadedness or dizziness. No fever. Ear exam unremarkable. Salicylate level ordered which is normal. Do not believe symptoms today require further emergent workup, but have recommended ENT follow up; referral provided. Return precautions discussed and patient agreeable to plan with no unaddressed concerns.   Filed Vitals:   07/11/14 0018 07/11/14 0457  BP: 133/82 127/73  Pulse: 79 74  Temp: 98.1 F (36.7 C)   TempSrc: Oral   Resp: 14 14  SpO2: 97% 100%       Antonietta Breach, PA-C 07/11/14 [phone]

## 2014-07-11 NOTE — ED Notes (Signed)
Pt sts that both of her ears have been "echoing" x 1 week. No other symptoms, no pain. A&Ox4.

## 2014-07-11 NOTE — ED Notes (Signed)
Pt presents with c/o buzzing in her ears. Pt has no hx of tinnitus and describes the pain as buzzing in both of her ears. Pt says that this sensation has lasted about a week.

## 2014-07-11 NOTE — Discharge Instructions (Signed)
Tinnitus °Sounds you hear in your ears and coming from within the ear is called tinnitus. This can be a symptom of many ear disorders. It is often associated with hearing loss.  °Tinnitus can be seen with: °· Infections. °· Ear blockages such as wax buildup. °· Meniere's disease. °· Ear damage. °· Inherited. °· Occupational causes. °While irritating, it is not usually a threat to health. When the cause of the tinnitus is wax, infection in the middle ear, or foreign body it is easily treated. Hearing loss will usually be reversible.  °TREATMENT  °When treating the underlying cause does not get rid of tinnitus, it may be necessary to get rid of the unwanted sound by covering it up with more pleasant background noises. This may include music, the radio etc. There are tinnitus maskers which can be worn which produce background noise to cover up the tinnitus. °Avoid all medications which tend to make tinnitus worse such as alcohol, caffeine, aspirin, and nicotine. There are many soothing background tapes such as rain, ocean, thunderstorms, etc. These soothing sounds help with sleeping or resting. °Keep all follow-up appointments and referrals. This is important to identify the cause of the problem. It also helps avoid complications, impaired hearing, disability, or chronic pain. °Document Released: 11/14/2005 Document Revised: 02/06/2012 Document Reviewed: 07/02/2008 °[employer]® Patient Information ©2015 [employer]. This information is not intended to replace advice given to you by your health care provider. Make sure you discuss any questions you have with your health care provider. ° °

## 2014-07-14 DIAGNOSIS — H40009 Preglaucoma, unspecified, unspecified eye: Secondary | ICD-10-CM | POA: Diagnosis not present

## 2014-07-14 DIAGNOSIS — I10 Essential (primary) hypertension: Secondary | ICD-10-CM | POA: Diagnosis not present

## 2014-07-14 DIAGNOSIS — H35039 Hypertensive retinopathy, unspecified eye: Secondary | ICD-10-CM | POA: Diagnosis not present

## 2014-07-14 DIAGNOSIS — H269 Unspecified cataract: Secondary | ICD-10-CM | POA: Diagnosis not present

## 2014-07-16 DIAGNOSIS — H905 Unspecified sensorineural hearing loss: Secondary | ICD-10-CM | POA: Diagnosis not present

## 2014-07-16 DIAGNOSIS — H903 Sensorineural hearing loss, bilateral: Secondary | ICD-10-CM | POA: Diagnosis not present

## 2014-07-16 DIAGNOSIS — H9319 Tinnitus, unspecified ear: Secondary | ICD-10-CM | POA: Diagnosis not present

## 2014-07-19 ENCOUNTER — Encounter (HOSPITAL_COMMUNITY): Payer: Self-pay | Admitting: Emergency Medicine

## 2014-07-19 ENCOUNTER — Emergency Department (HOSPITAL_COMMUNITY)
Admission: EM | Admit: 2014-07-19 | Discharge: 2014-07-19 | Disposition: A | Discharge status: Home / Self Care | Payer: Medicare Other | Attending: Emergency Medicine | Admitting: Emergency Medicine

## 2014-07-19 DIAGNOSIS — I10 Essential (primary) hypertension: Secondary | ICD-10-CM | POA: Insufficient documentation

## 2014-07-19 DIAGNOSIS — Z79899 Other long term (current) drug therapy: Secondary | ICD-10-CM | POA: Insufficient documentation

## 2014-07-19 DIAGNOSIS — Z7982 Long term (current) use of aspirin: Secondary | ICD-10-CM | POA: Diagnosis not present

## 2014-07-19 DIAGNOSIS — H9319 Tinnitus, unspecified ear: Secondary | ICD-10-CM | POA: Insufficient documentation

## 2014-07-19 DIAGNOSIS — H9311 Tinnitus, right ear: Secondary | ICD-10-CM

## 2014-07-19 DIAGNOSIS — R69 Illness, unspecified: Secondary | ICD-10-CM | POA: Diagnosis not present

## 2014-07-19 LAB — URINALYSIS, ROUTINE W REFLEX MICROSCOPIC
BILIRUBIN URINE: NEGATIVE
Glucose, UA: NEGATIVE mg/dL
HGB URINE DIPSTICK: NEGATIVE
Ketones, ur: NEGATIVE mg/dL
Leukocytes, UA: NEGATIVE
Nitrite: NEGATIVE
PH: 6 (ref 5.0–8.0)
Protein, ur: NEGATIVE mg/dL
SPECIFIC GRAVITY, URINE: 1.007 (ref 1.005–1.030)
Urobilinogen, UA: 0.2 mg/dL (ref 0.0–1.0)

## 2014-07-19 NOTE — ED Notes (Signed)
MD at bedside. Dr. Alvino Chapel at bedside.

## 2014-07-19 NOTE — ED Provider Notes (Signed)
Medical screening examination/treatment/procedure(s) were conducted as a shared visit with non-physician practitioner(s) and myself.  I personally evaluated the patient during the encounter.   EKG Interpretation None     73 year old female with a few months of tinnitus in both ears worse on the right ear denies hearing loss no significant headache no sudden headache no severe headache no change in vision she has chronic slight blurred vision with near vision and needs reading glasses and has seen her eye doctor for this as well as in your nose and throat Dr. for the tinnitus she has had no vertigo no weakness no numbness no recent change in vision no change in speech or swallowing and no incoordination no scotomas no blindness no other concerns; doubt SAH, CVA. 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  Allison Relic, MD 07/29/14 [phone]

## 2014-07-19 NOTE — ED Provider Notes (Signed)
CSN: [id number]     Arrival date & time 07/19/14  2136 History  This chart was scribed for non-physician practitioner working with Babette Relic, MD by Mercy Moore, ED Scribe. This patient was seen in room WTR5/WTR5 and the patient's care was started at 9:47 PM.   Chief Complaint  Patient presents with  . Tinnitus    The history is provided by the patient. No language interpreter was used.   HPI Comments: Allison Franco is a 73 y.o. female brought in by ambulance, who presents to the Emergency Department complaining of recurrent intermittent right ear tinnitus for three weeks. Patient reports worsening today stating the ringing has become unbearable. Patient reports a slight headache, but denies any other symptoms. Patient evaluated by ENT three days ago. After undergoing series of test no causation was found for her tinnitus. She was informed that there are no medications for condition and to follow up in six months.   PCP: Reymundo Poll, MD  Past Medical History  Diagnosis Date  . Hypertension    Past Surgical History  Procedure Laterality Date  . Fribroid surgery    . Abdominal hysterectomy     History reviewed. No pertinent family history. History  Substance Use Topics  . Smoking status: Never Smoker   . Smokeless tobacco: Not on file  . Alcohol Use: No   OB History   Grav Para Term Preterm Abortions TAB SAB Ect Mult Living                 Review of Systems  Constitutional: Negative for fever and chills.  HENT: Positive for tinnitus. Negative for ear pain.   Neurological: Positive for headaches.    Allergies  Review of patient's allergies indicates no known allergies.  Home Medications   Prior to Admission medications   Medication Sig Start Date End Date Taking? Authorizing Provider  aspirin EC 81 MG tablet Take 81 mg by mouth daily.      Historical Provider, MD  clonazePAM (KLONOPIN) 0.5 MG tablet Take 1 tablet (0.5 mg total) by mouth 2 (two) times daily. 10/21/13    Thurnell Lose, MD  lisinopril (PRINIVIL,ZESTRIL) 10 MG tablet Take 10 mg by mouth at bedtime.    Historical Provider, MD  Multiple Vitamin (MULTI-VITAMIN DAILY PO) Take 1 tablet by mouth daily.    Historical Provider, MD  traMADol (ULTRAM) 50 MG tablet Take 50 mg by mouth every 6 (six) hours as needed for moderate pain.    Historical Provider, MD   Triage Vitals: BP 176/84  Pulse 97  Temp(Src) 98.4 F (36.9 C) (Oral)  Resp 16  SpO2 100%  Physical Exam  Nursing note and vitals reviewed. Constitutional: She is oriented to person, place, and time. She appears well-developed and well-nourished.  HENT:  Head: Normocephalic and atraumatic.  Right Ear: External ear normal.  Left Ear: External ear normal.  Mouth/Throat: Oropharynx is clear and moist.  Eyes: Conjunctivae and EOM are normal. Pupils are equal, round, and reactive to light. No scleral icterus.  Neck: Normal range of motion. Neck supple.  No carotid bruits noted bilaterally.   Cardiovascular: Normal rate and regular rhythm.   Pulmonary/Chest: Effort normal and breath sounds normal. No respiratory distress. She has no wheezes. She has no rales.  Musculoskeletal: Normal range of motion.  Lymphadenopathy:    She has no cervical adenopathy.  Neurological: She is alert and oriented to person, place, and time. No cranial nerve deficit. Coordination normal.  Extremity strength and  sensation equal and intact bilaterally. Patient ambulates without difficulty.   Skin: Skin is warm and dry.  Psychiatric: She has a normal mood and affect. Her behavior is normal.    ED Course  Procedures (including critical care time)  COORDINATION OF CARE: 10:08 PM- Patient requests head CT. Discussed treatment plan with patient at bedside and patient agreed to plan.   Labs Review Labs Reviewed  URINALYSIS, ROUTINE W REFLEX MICROSCOPIC    Imaging Review No results found.   EKG Interpretation None      MDM   Final diagnoses:   Tinnitus of right ear   11:29 PM Patient presents with tinnitus of right ear that has been persistent for the past 3 weeks. Patient reports taking one 81mg  aspirin per day. Salicylate level checked at her last visit. Patient was seen by ENT who did not give her a definitive diagnosis. Patient is frustrated and wants imaging of her head. I spoke with Dr. Stevie Kern about the patient who recommends another follow up with ENT and patient to listen to "Calm Radio" to distract herself from the tinnitus. Patient has no neuro deficits and denies vertigo.   I personally performed the services described in this documentation, which was scribed in my presence. The recorded information has been reviewed and is accurate.    Alvina Chou, PA-C 07/19/14 2332

## 2014-07-19 NOTE — Discharge Instructions (Signed)
Follow up with your doctor for further evaluation. Download "Calm Radio" app to listen to pleasant noise while falling asleep. Refer to attached documents for more information.

## 2014-07-19 NOTE — ED Notes (Signed)
Pt presents w/ c/o tinnitus in her right ear x 3 weeks.  Tinnitus became unbearable today.  Per EMS pt's husband died 3 months ago and pt has been experiencing depression.  Pt saw ENT w/o dx of where tinnitus is coming from.  Pt denies pain, injury to ear/head or submersion in water.

## 2014-07-25 DIAGNOSIS — H9319 Tinnitus, unspecified ear: Secondary | ICD-10-CM | POA: Diagnosis not present

## 2014-07-25 DIAGNOSIS — Z131 Encounter for screening for diabetes mellitus: Secondary | ICD-10-CM | POA: Diagnosis not present

## 2014-07-25 DIAGNOSIS — H6500 Acute serous otitis media, unspecified ear: Secondary | ICD-10-CM | POA: Diagnosis not present

## 2014-07-25 DIAGNOSIS — I10 Essential (primary) hypertension: Secondary | ICD-10-CM | POA: Diagnosis not present

## 2014-07-25 DIAGNOSIS — E785 Hyperlipidemia, unspecified: Secondary | ICD-10-CM | POA: Diagnosis not present

## 2014-07-31 DIAGNOSIS — H43819 Vitreous degeneration, unspecified eye: Secondary | ICD-10-CM | POA: Diagnosis not present

## 2014-07-31 DIAGNOSIS — H4011X0 Primary open-angle glaucoma, stage unspecified: Secondary | ICD-10-CM | POA: Diagnosis not present

## 2014-08-11 DIAGNOSIS — I10 Essential (primary) hypertension: Secondary | ICD-10-CM | POA: Diagnosis not present

## 2014-08-11 DIAGNOSIS — H9319 Tinnitus, unspecified ear: Secondary | ICD-10-CM | POA: Diagnosis not present

## 2014-08-11 DIAGNOSIS — H905 Unspecified sensorineural hearing loss: Secondary | ICD-10-CM | POA: Diagnosis not present

## 2014-08-11 DIAGNOSIS — F411 Generalized anxiety disorder: Secondary | ICD-10-CM | POA: Diagnosis not present

## 2014-08-11 DIAGNOSIS — G4700 Insomnia, unspecified: Secondary | ICD-10-CM | POA: Diagnosis not present

## 2014-08-27 DIAGNOSIS — Z23 Encounter for immunization: Secondary | ICD-10-CM | POA: Diagnosis not present

## 2014-08-29 DIAGNOSIS — J019 Acute sinusitis, unspecified: Secondary | ICD-10-CM | POA: Diagnosis not present

## 2014-08-29 DIAGNOSIS — I10 Essential (primary) hypertension: Secondary | ICD-10-CM | POA: Diagnosis not present

## 2014-08-29 DIAGNOSIS — R7302 Impaired glucose tolerance (oral): Secondary | ICD-10-CM | POA: Diagnosis not present

## 2014-08-29 DIAGNOSIS — K5901 Slow transit constipation: Secondary | ICD-10-CM | POA: Diagnosis not present

## 2014-08-29 DIAGNOSIS — M2550 Pain in unspecified joint: Secondary | ICD-10-CM | POA: Diagnosis not present

## 2014-08-29 DIAGNOSIS — K219 Gastro-esophageal reflux disease without esophagitis: Secondary | ICD-10-CM | POA: Diagnosis not present

## 2014-09-04 DIAGNOSIS — J018 Other acute sinusitis: Secondary | ICD-10-CM | POA: Diagnosis not present

## 2014-09-04 DIAGNOSIS — I10 Essential (primary) hypertension: Secondary | ICD-10-CM | POA: Diagnosis not present

## 2014-09-04 DIAGNOSIS — J209 Acute bronchitis, unspecified: Secondary | ICD-10-CM | POA: Diagnosis not present

## 2014-12-02 ENCOUNTER — Other Ambulatory Visit (HOSPITAL_COMMUNITY): Payer: Self-pay | Admitting: Geriatric Medicine

## 2014-12-02 DIAGNOSIS — Z1231 Encounter for screening mammogram for malignant neoplasm of breast: Secondary | ICD-10-CM

## 2014-12-08 DIAGNOSIS — M25569 Pain in unspecified knee: Secondary | ICD-10-CM | POA: Diagnosis not present

## 2014-12-23 DIAGNOSIS — K219 Gastro-esophageal reflux disease without esophagitis: Secondary | ICD-10-CM | POA: Diagnosis not present

## 2014-12-23 DIAGNOSIS — D649 Anemia, unspecified: Secondary | ICD-10-CM | POA: Diagnosis not present

## 2014-12-23 DIAGNOSIS — E784 Other hyperlipidemia: Secondary | ICD-10-CM | POA: Diagnosis not present

## 2014-12-23 DIAGNOSIS — N189 Chronic kidney disease, unspecified: Secondary | ICD-10-CM | POA: Diagnosis not present

## 2014-12-23 DIAGNOSIS — E119 Type 2 diabetes mellitus without complications: Secondary | ICD-10-CM | POA: Diagnosis not present

## 2014-12-23 DIAGNOSIS — K5901 Slow transit constipation: Secondary | ICD-10-CM | POA: Diagnosis not present

## 2014-12-23 DIAGNOSIS — I10 Essential (primary) hypertension: Secondary | ICD-10-CM | POA: Diagnosis not present

## 2014-12-23 DIAGNOSIS — E039 Hypothyroidism, unspecified: Secondary | ICD-10-CM | POA: Diagnosis not present

## 2014-12-23 DIAGNOSIS — R7302 Impaired glucose tolerance (oral): Secondary | ICD-10-CM | POA: Diagnosis not present

## 2014-12-23 DIAGNOSIS — M2550 Pain in unspecified joint: Secondary | ICD-10-CM | POA: Diagnosis not present

## 2014-12-23 DIAGNOSIS — J019 Acute sinusitis, unspecified: Secondary | ICD-10-CM | POA: Diagnosis not present

## 2015-02-26 DIAGNOSIS — M2550 Pain in unspecified joint: Secondary | ICD-10-CM | POA: Diagnosis not present

## 2015-02-26 DIAGNOSIS — K219 Gastro-esophageal reflux disease without esophagitis: Secondary | ICD-10-CM | POA: Diagnosis not present

## 2015-02-26 DIAGNOSIS — R7302 Impaired glucose tolerance (oral): Secondary | ICD-10-CM | POA: Diagnosis not present

## 2015-02-26 DIAGNOSIS — K5901 Slow transit constipation: Secondary | ICD-10-CM | POA: Diagnosis not present

## 2015-02-26 DIAGNOSIS — I10 Essential (primary) hypertension: Secondary | ICD-10-CM | POA: Diagnosis not present

## 2015-05-12 ENCOUNTER — Encounter (HOSPITAL_COMMUNITY): Payer: Self-pay | Admitting: Emergency Medicine

## 2015-05-12 ENCOUNTER — Emergency Department (INDEPENDENT_AMBULATORY_CARE_PROVIDER_SITE_OTHER)
Admission: EM | Admit: 2015-05-12 | Discharge: 2015-05-12 | Disposition: A | Discharge status: Home / Self Care | Payer: Medicare Other | Source: Home / Self Care | Attending: Family Medicine | Admitting: Family Medicine

## 2015-05-12 DIAGNOSIS — S39012A Strain of muscle, fascia and tendon of lower back, initial encounter: Secondary | ICD-10-CM | POA: Diagnosis not present

## 2015-05-12 MED ORDER — TRAMADOL HCL 50 MG PO TABS: 50.0000 mg | ORAL_TABLET | Freq: Four times a day (QID) | ORAL | Status: DC | PRN

## 2015-05-12 MED ORDER — DICLOFENAC SODIUM 1 % TD GEL: 1.0000 "application " | Freq: Four times a day (QID) | TRANSDERMAL | Status: DC

## 2015-05-12 NOTE — Discharge Instructions (Signed)
Back Pain, Adult Heat, may want to use Thermacare wraps for heat Low back pain is very common. About 1 in 5 people have back pain.The cause of low back pain is rarely dangerous. The pain often gets better over time.About half of people with a sudden onset of back pain feel better in just 2 weeks. About 8 in 10 people feel better by 6 weeks.  CAUSES Some common causes of back pain include:  Strain of the muscles or ligaments supporting the spine.  Wear and tear (degeneration) of the spinal discs.  Arthritis.  Direct injury to the back. DIAGNOSIS Most of the time, the direct cause of low back pain is not known.However, back pain can be treated effectively even when the exact cause of the pain is unknown.Answering your caregiver's questions about your overall health and symptoms is one of the most accurate ways to make sure the cause of your pain is not dangerous. If your caregiver needs more information, he or she may order lab work or imaging tests (X-rays or MRIs).However, even if imaging tests show changes in your back, this usually does not require surgery. HOME CARE INSTRUCTIONS For many people, back pain returns.Since low back pain is rarely dangerous, it is often a condition that people can learn to [facility] their own.   Remain active. It is stressful on the back to sit or stand in one place. Do not sit, drive, or stand in one place for more than 30 minutes at a time. Take short walks on level surfaces as soon as pain allows.Try to increase the length of time you walk each day.  Do not stay in bed.Resting more than 1 or 2 days can delay your recovery.  Do not avoid exercise or work.Your body is made to move.It is not dangerous to be active, even though your back may hurt.Your back will likely heal faster if you return to being active before your pain is gone.  Pay attention to your body when you bend and lift. Many people have less discomfortwhen lifting if they bend their  knees, keep the load close to their bodies,and avoid twisting. Often, the most comfortable positions are those that put less stress on your recovering back.  Find a comfortable position to sleep. Use a firm mattress and lie on your side with your knees slightly bent. If you lie on your back, put a pillow under your knees.  Only take over-the-counter or prescription medicines as directed by your caregiver. Over-the-counter medicines to reduce pain and inflammation are often the most helpful.Your caregiver may prescribe muscle relaxant drugs.These medicines help dull your pain so you can more quickly return to your normal activities and healthy exercise.    Ask your caregiver about trying back exercises and gentle massage. This may be of some benefit.  Avoid feeling anxious or stressed.Stress increases muscle tension and can worsen back pain.It is important to recognize when you are anxious or stressed and learn ways to manage it.Exercise is a great option. SEEK MEDICAL CARE IF:  You have pain that is not relieved with rest or medicine.  You have pain that does not improve in 1 week.  You have new symptoms.  You are generally not feeling well. SEEK IMMEDIATE MEDICAL CARE IF:   You have pain that radiates from your back into your legs.  You develop new bowel or bladder control problems.  You have unusual weakness or numbness in your arms or legs.  You develop nausea or vomiting.  You develop abdominal pain.  You feel faint. Document Released: 11/14/2005 Document Revised: 05/15/2012 Document Reviewed: 03/18/2014 [facility] Patient Information 2015 [place], [place]. This information is not intended to replace advice given to you by your health care provider. Make sure you discuss any questions you have with your health care provider.  Lumbosacral Strain Lumbosacral strain is a strain of any of the parts that make up your lumbosacral vertebrae. Your lumbosacral vertebrae are the  bones that make up the lower third of your backbone. Your lumbosacral vertebrae are held together by muscles and tough, fibrous tissue (ligaments).  CAUSES  A sudden blow to your back can cause lumbosacral strain. Also, anything that causes an excessive stretch of the muscles in the low back can cause this strain. This is typically seen when people exert themselves strenuously, fall, lift heavy objects, bend, or crouch repeatedly. RISK FACTORS  Physically demanding work.  Participation in pushing or pulling sports or sports that require a sudden twist of the back (tennis, golf, baseball).  Weight lifting.  Excessive lower back curvature.  Forward-tilted pelvis.  Weak back or abdominal muscles or both.  Tight hamstrings. SIGNS AND SYMPTOMS  Lumbosacral strain may cause pain in the area of your injury or pain that moves (radiates) down your leg.  DIAGNOSIS Your health care provider can often diagnose lumbosacral strain through a physical exam. In some cases, you may need tests such as X-ray exams.  TREATMENT  Treatment for your lower back injury depends on many factors that your clinician will have to evaluate. However, most treatment will include the use of anti-inflammatory medicines. HOME CARE INSTRUCTIONS   Avoid hard physical activities (tennis, racquetball, waterskiing) if you are not in proper physical condition for it. This may aggravate or create problems.  If you have a back problem, avoid sports requiring sudden body movements. Swimming and walking are generally safer activities.  Maintain good posture.  Maintain a healthy weight.  For acute conditions, you may put ice on the injured area.  Put ice in a plastic bag.  Place a towel between your skin and the bag.  Leave the ice on for 20 minutes, 2-3 times a day.  When the low back starts healing, stretching and strengthening exercises may be recommended. SEEK MEDICAL CARE IF:  Your back pain is getting  worse.  You experience severe back pain not relieved with medicines. SEEK IMMEDIATE MEDICAL CARE IF:   You have numbness, tingling, weakness, or problems with the use of your arms or legs.  There is a change in bowel or bladder control.  You have increasing pain in any area of the body, including your belly (abdomen).  You notice shortness of breath, dizziness, or feel faint.  You feel sick to your stomach (nauseous), are throwing up (vomiting), or become sweaty.  You notice discoloration of your toes or legs, or your feet get very cold. MAKE SURE YOU:   Understand these instructions.  Will watch your condition.  Will get help right away if you are not doing well or get worse. Document Released: 08/24/2005 Document Revised: 11/19/2013 Document Reviewed: 07/03/2013 [facility] Patient Information 2015 [place], [place]. This information is not intended to replace advice given to you by your health care provider. Make sure you discuss any questions you have with your health care provider.

## 2015-05-12 NOTE — ED Provider Notes (Signed)
CSN: [id number]     Arrival date & time 05/12/15  1620 History   First MD Initiated Contact with Patient 05/12/15 1712     Chief Complaint  Patient presents with  . Back Pain   (Consider location/radiation/quality/duration/timing/severity/associated sxs/prior Treatment) HPI Comments: 74 year old female states that she developed low mid back pain 4 days ago. She was trying to change a diaper of a small child which was very heavy and while trying to lift him she experienced sudden acute pain in the mid low back. In the past 4 days is been getting worse particular E with leaning forward. She denies radicular pain or distal weakness or paresthesias. When walking the pain seems to be little bit better.  Patient is a 74 y.o. female presenting with back pain.  Back Pain Associated symptoms: no fever     Past Medical History  Diagnosis Date  . Hypertension    Past Surgical History  Procedure Laterality Date  . Fribroid surgery    . Abdominal hysterectomy     History reviewed. No pertinent family history. History  Substance Use Topics  . Smoking status: Never Smoker   . Smokeless tobacco: Not on file  . Alcohol Use: No   OB History    No data available     Review of Systems  Constitutional: Positive for activity change. Negative for fever, chills and fatigue.  HENT: Negative.   Respiratory: Negative.   Musculoskeletal: Positive for back pain. Negative for neck pain.       As per HPI  Skin: Negative for color change and pallor.  Neurological: Negative.     Allergies  Review of patient's allergies indicates no known allergies.  Home Medications   Prior to Admission medications   Medication Sig Start Date End Date Taking? Authorizing Provider  aspirin EC 81 MG tablet Take 81 mg by mouth daily.     Yes Historical Provider, MD  clonazePAM (KLONOPIN) 0.5 MG tablet Take 1 tablet (0.5 mg total) by mouth 2 (two) times daily. 10/21/13  Yes Thurnell Lose, MD  lisinopril  (PRINIVIL,ZESTRIL) 10 MG tablet Take 10 mg by mouth at bedtime.   Yes Historical Provider, MD  diclofenac sodium (VOLTAREN) 1 % GEL Apply 1 application topically 4 (four) times daily. 05/12/15   Janne Napoleon, NP  Multiple Vitamin (MULTI-VITAMIN DAILY PO) Take 1 tablet by mouth daily.    Historical Provider, MD  traMADol (ULTRAM) 50 MG tablet Take 1 tablet (50 mg total) by mouth every 6 (six) hours as needed. 05/12/15   Janne Napoleon, NP   BP 154/85 mmHg  Pulse 100  Temp(Src) 98.1 F (36.7 C) (Oral)  Resp 20  SpO2 100% Physical Exam  Constitutional: She is oriented to person, place, and time. She appears well-developed and well-nourished. No distress.  HENT:  Head: Normocephalic and atraumatic.  Eyes: EOM are normal.  Neck: Normal range of motion. Neck supple.  Pulmonary/Chest: Effort normal. No respiratory distress.  Musculoskeletal:  Tenderness to the lower lumbosacral musculature. No direct tenderness to the lumbar spine or the sacrum. Leaning forward as much is 20 produces pain. She is ambulatory with smooth and balanced gait. No cervical or thoracic tenderness.  Lymphadenopathy:    She has no cervical adenopathy.  Neurological: She is alert and oriented to person, place, and time. No cranial nerve deficit.  Skin: Skin is warm and dry.  Psychiatric: She has a normal mood and affect.  Nursing note and vitals reviewed.   ED Course  Procedures (  including critical care time) Labs Review Labs Reviewed - No data to display  Imaging Review No results found.   MDM   1. Low back strain, initial encounter    Heat, may want to use Thermacare wraps for heat Diclofenac gel as dir tramadol 50 mg #15 Stretches as demo'd No heavy lifting or bending for 10 days.    Janne Napoleon, NP 05/12/15 1737

## 2015-05-12 NOTE — ED Notes (Signed)
Pt complains of lower back pain that hurts more with movement, like sitting or standing.  Pt denies any injury.

## 2015-05-17 ENCOUNTER — Emergency Department (HOSPITAL_COMMUNITY): Payer: Medicare Other

## 2015-05-17 ENCOUNTER — Emergency Department (HOSPITAL_COMMUNITY)
Admission: EM | Admit: 2015-05-17 | Discharge: 2015-05-18 | Disposition: A | Discharge status: Home / Self Care | Payer: Medicare Other | Attending: Emergency Medicine | Admitting: Emergency Medicine

## 2015-05-17 ENCOUNTER — Encounter (HOSPITAL_COMMUNITY): Payer: Self-pay | Admitting: Emergency Medicine

## 2015-05-17 DIAGNOSIS — R109 Unspecified abdominal pain: Secondary | ICD-10-CM | POA: Insufficient documentation

## 2015-05-17 DIAGNOSIS — Z7982 Long term (current) use of aspirin: Secondary | ICD-10-CM | POA: Insufficient documentation

## 2015-05-17 DIAGNOSIS — Z791 Long term (current) use of non-steroidal anti-inflammatories (NSAID): Secondary | ICD-10-CM | POA: Diagnosis not present

## 2015-05-17 DIAGNOSIS — M47814 Spondylosis without myelopathy or radiculopathy, thoracic region: Secondary | ICD-10-CM | POA: Diagnosis not present

## 2015-05-17 DIAGNOSIS — M48061 Spinal stenosis, lumbar region without neurogenic claudication: Secondary | ICD-10-CM

## 2015-05-17 DIAGNOSIS — M5136 Other intervertebral disc degeneration, lumbar region: Secondary | ICD-10-CM | POA: Diagnosis not present

## 2015-05-17 DIAGNOSIS — M546 Pain in thoracic spine: Secondary | ICD-10-CM | POA: Diagnosis not present

## 2015-05-17 DIAGNOSIS — M549 Dorsalgia, unspecified: Secondary | ICD-10-CM

## 2015-05-17 DIAGNOSIS — Z90710 Acquired absence of both cervix and uterus: Secondary | ICD-10-CM | POA: Diagnosis not present

## 2015-05-17 DIAGNOSIS — M4806 Spinal stenosis, lumbar region: Secondary | ICD-10-CM | POA: Diagnosis not present

## 2015-05-17 DIAGNOSIS — Z79899 Other long term (current) drug therapy: Secondary | ICD-10-CM | POA: Insufficient documentation

## 2015-05-17 DIAGNOSIS — I10 Essential (primary) hypertension: Secondary | ICD-10-CM | POA: Diagnosis not present

## 2015-05-17 DIAGNOSIS — M47816 Spondylosis without myelopathy or radiculopathy, lumbar region: Secondary | ICD-10-CM | POA: Diagnosis not present

## 2015-05-17 LAB — CBC WITH DIFFERENTIAL/PLATELET
BASOS ABS: 0 10*3/uL (ref 0.0–0.1)
Basophils Relative: 0 % (ref 0–1)
EOS ABS: 0.2 10*3/uL (ref 0.0–0.7)
Eosinophils Relative: 2 % (ref 0–5)
HCT: 40.9 % (ref 36.0–46.0)
HEMOGLOBIN: 12.8 g/dL (ref 12.0–15.0)
LYMPHS PCT: 29 % (ref 12–46)
Lymphs Abs: 2.2 10*3/uL (ref 0.7–4.0)
MCH: 27.7 pg (ref 26.0–34.0)
MCHC: 31.3 g/dL (ref 30.0–36.0)
MCV: 88.5 fL (ref 78.0–100.0)
MONOS PCT: 7 % (ref 3–12)
Monocytes Absolute: 0.5 10*3/uL (ref 0.1–1.0)
Neutro Abs: 4.8 10*3/uL (ref 1.7–7.7)
Neutrophils Relative %: 62 % (ref 43–77)
Platelets: 266 10*3/uL (ref 150–400)
RBC: 4.62 MIL/uL (ref 3.87–5.11)
RDW: 13 % (ref 11.5–15.5)
WBC: 7.8 10*3/uL (ref 4.0–10.5)

## 2015-05-17 LAB — URINALYSIS, ROUTINE W REFLEX MICROSCOPIC
Bilirubin Urine: NEGATIVE
GLUCOSE, UA: NEGATIVE mg/dL
Hgb urine dipstick: NEGATIVE
Ketones, ur: NEGATIVE mg/dL
Nitrite: NEGATIVE
Protein, ur: NEGATIVE mg/dL
Specific Gravity, Urine: 1.009 (ref 1.005–1.030)
Urobilinogen, UA: 0.2 mg/dL (ref 0.0–1.0)
pH: 6 (ref 5.0–8.0)

## 2015-05-17 LAB — URINE MICROSCOPIC-ADD ON

## 2015-05-17 MED ORDER — LORAZEPAM 2 MG/ML IJ SOLN
1.0000 mg | Freq: Once | INTRAMUSCULAR | Status: AC
  Administered 2015-05-17: 1 mg via INTRAVENOUS
  Filled 2015-05-17: qty 1

## 2015-05-17 MED ORDER — OXYCODONE-ACETAMINOPHEN 7.5-325 MG PO TABS: 1.0000 | ORAL_TABLET | ORAL | Status: DC | PRN

## 2015-05-17 MED ORDER — SODIUM CHLORIDE 0.9 % IV SOLN
INTRAVENOUS | Status: DC
  Administered 2015-05-17: 21:00:00 via INTRAVENOUS

## 2015-05-17 MED ORDER — MORPHINE SULFATE 4 MG/ML IJ SOLN
4.0000 mg | Freq: Once | INTRAMUSCULAR | Status: AC
  Administered 2015-05-17: 4 mg via INTRAVENOUS
  Filled 2015-05-17: qty 1

## 2015-05-17 MED ORDER — PREDNISONE 20 MG PO TABS: 40.0000 mg | ORAL_TABLET | Freq: Every day | ORAL | Status: DC

## 2015-05-17 MED ORDER — DIAZEPAM 2 MG PO TABS: 2.0000 mg | ORAL_TABLET | Freq: Four times a day (QID) | ORAL | Status: DC | PRN

## 2015-05-17 MED ORDER — DEXAMETHASONE SODIUM PHOSPHATE 10 MG/ML IJ SOLN
10.0000 mg | Freq: Once | INTRAMUSCULAR | Status: AC
  Administered 2015-05-18: 10 mg via INTRAVENOUS
  Filled 2015-05-17: qty 1

## 2015-05-17 NOTE — ED Notes (Signed)
Pt reports left flank and left lower abd pain. Pt states the pain is sharp, constant. When attempting to have a bowel movement, the pain increases and goes to rectum. Denies nausea, vomiting, diarrhea, and fever.

## 2015-05-17 NOTE — ED Notes (Signed)
Pt transported to MRI, recollect of CMP delayed, RN notified

## 2015-05-17 NOTE — Discharge Instructions (Signed)
Spinal Stenosis Spinal stenosis is an abnormal narrowing of the canals of your spine (vertebrae). CAUSES  Spinal stenosis is caused by areas of bone pushing into the central canals of your vertebrae. This condition can be present at birth (congenital). It also may be caused by arthritic deterioration of your vertebrae (spinal degeneration).  SYMPTOMS   Pain that is generally worse with activities, particularly standing and walking.  Numbness, tingling, hot or cold sensations, weakness, or weariness in your legs.  Frequent episodes of falling.  A foot-slapping gait that leads to muscle weakness. DIAGNOSIS  Spinal stenosis is diagnosed with the use of magnetic resonance imaging (MRI) or computed tomography (CT). TREATMENT  Initial therapy for spinal stenosis focuses on the management of the pain and other symptoms associated with the condition. These therapies include:  Practicing postural changes to lessen pressure on your nerves.  Exercises to strengthen the core of your body.  Loss of excess body weight.  The use of nonsteroidal anti-inflammatory medicines to reduce swelling and inflammation in your nerves. When therapies to manage pain are not successful, surgery to treat spinal stenosis may be recommended. This surgery involves removing excess bone, which puts pressure on your nerve roots. During this surgery (laminectomy), the posterior boney arch (lamina) and excess bone around the facet joints are removed. Document Released: 02/04/2004 Document Revised: 03/31/2014 Document Reviewed: 02/22/2013 Exit[employer] Patient Information 2015 [employer]. This information is not intended to replace advice given to you by your health care provider. Make sure you discuss any questions you have with your health care provider.  

## 2015-05-17 NOTE — ED Notes (Signed)
Dr. Zenia Resides at bedside. Dr. Zenia Resides said to hold on labs til he done talking with patient.

## 2015-05-17 NOTE — ED Notes (Signed)
Will hold Ativan til MRI is ready.

## 2015-05-17 NOTE — ED Notes (Addendum)
Pt from home c/ low back pain, and lower abdominal pain. She reports issues with constipation. She reports taking Milk of Magnesia with out relief.  Denies urinary symptoms. Small BM today.

## 2015-05-17 NOTE — ED Notes (Signed)
Pt remains in MRI 

## 2015-05-17 NOTE — ED Provider Notes (Signed)
CSN: [id number]     Arrival date & time 05/17/15  2006 History   First MD Initiated Contact with Patient 05/17/15 2029     Chief Complaint  Patient presents with  . Abdominal Pain  . Back Pain     (Consider location/radiation/quality/duration/timing/severity/associated sxs/prior Treatment) HPI Comments: Patient here complaining of worsening mid to lower back pain 10 days. Patient states pain has been persistent and worse with movement characterized as sharp. Denies any urinary symptoms. No fever or chills. No bowel or bladder dysfunction. Denies any saddle anesthesias. Denies any history of trauma or prior history of back surgery. Denies any abdominal pain currently at this time. No vomiting noted. It is also worse with sitting for prolonged period of times a day when she likes flat. She denies any distal paresthesias to her lower extremities. Was seen at urgent care 5 days ago for similar symptoms and diagnosed with low back strain. She was placed on medications consisting of tramadol and diclofenac without relief  Patient is a 74 y.o. female presenting with abdominal pain and back pain. The history is provided by the patient.  Abdominal Pain Back Pain Associated symptoms: abdominal pain     Past Medical History  Diagnosis Date  . Hypertension    Past Surgical History  Procedure Laterality Date  . Fribroid surgery    . Abdominal hysterectomy     No family history on file. History  Substance Use Topics  . Smoking status: Never Smoker   . Smokeless tobacco: Not on file  . Alcohol Use: No   OB History    No data available     Review of Systems  Gastrointestinal: Positive for abdominal pain.  Musculoskeletal: Positive for back pain.  All other systems reviewed and are negative.     Allergies  Review of patient's allergies indicates no known allergies.  Home Medications   Prior to Admission medications   Medication Sig Start Date End Date Taking? Authorizing Provider   aspirin EC 81 MG tablet Take 81 mg by mouth daily.      Historical Provider, MD  clonazePAM (KLONOPIN) 0.5 MG tablet Take 1 tablet (0.5 mg total) by mouth 2 (two) times daily. 10/21/13   Thurnell Lose, MD  diclofenac sodium (VOLTAREN) 1 % GEL Apply 1 application topically 4 (four) times daily. 05/12/15   Janne Napoleon, NP  lisinopril (PRINIVIL,ZESTRIL) 10 MG tablet Take 10 mg by mouth at bedtime.    Historical Provider, MD  Multiple Vitamin (MULTI-VITAMIN DAILY PO) Take 1 tablet by mouth daily.    Historical Provider, MD  traMADol (ULTRAM) 50 MG tablet Take 1 tablet (50 mg total) by mouth every 6 (six) hours as needed. 05/12/15   Janne Napoleon, NP   BP 141/88 mmHg  Pulse 90  Temp(Src) 97.8 F (36.6 C) (Oral)  Resp 18  SpO2 99% Physical Exam  Constitutional: She is oriented to person, place, and time. She appears well-developed and well-nourished.  Non-toxic appearance. No distress.  HENT:  Head: Normocephalic and atraumatic.  Eyes: Conjunctivae, EOM and lids are normal. Pupils are equal, round, and reactive to light.  Neck: Normal range of motion. Neck supple. No tracheal deviation present. No thyroid mass present.  Cardiovascular: Normal rate, regular rhythm and normal heart sounds.  Exam reveals no gallop.   No murmur heard. Pulmonary/Chest: Effort normal and breath sounds normal. No stridor. No respiratory distress. She has no decreased breath sounds. She has no wheezes. She has no rhonchi. She has no rales.  Abdominal: Soft. Normal appearance and bowel sounds are normal. She exhibits no distension. There is no tenderness. There is no rebound and no CVA tenderness.  Musculoskeletal: Normal range of motion. She exhibits no edema or tenderness.       Back:  Neurological: She is alert and oriented to person, place, and time. She has normal strength. No cranial nerve deficit or sensory deficit. GCS eye subscore is 4. GCS verbal subscore is 5. GCS motor subscore is 6.  Skin: Skin is warm and  dry. No abrasion and no rash noted.  Psychiatric: She has a normal mood and affect. Her speech is normal and behavior is normal.  Nursing note and vitals reviewed.   ED Course  Procedures (including critical care time) Labs Review Labs Reviewed  CBC WITH DIFFERENTIAL/PLATELET  COMPREHENSIVE METABOLIC PANEL  URINALYSIS, ROUTINE W REFLEX MICROSCOPIC (NOT AT [facility]    Imaging Review No results found.   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 Patient's neurological exam remains stable. She has improved after being given medications here. Will be discharged to home and given referral to orthopedics as well as pain medication    Lacretia Leigh, MD 05/17/15 2351

## 2015-05-18 DIAGNOSIS — M4806 Spinal stenosis, lumbar region: Secondary | ICD-10-CM | POA: Diagnosis not present

## 2015-05-18 LAB — COMPREHENSIVE METABOLIC PANEL
ALT: 28 U/L (ref 14–54)
AST: 22 U/L (ref 15–41)
Albumin: 3.6 g/dL (ref 3.5–5.0)
Alkaline Phosphatase: 76 U/L (ref 38–126)
Anion gap: 4 — ABNORMAL LOW (ref 5–15)
BILIRUBIN TOTAL: 0.9 mg/dL (ref 0.3–1.2)
BUN: 20 mg/dL (ref 6–20)
CO2: 25 mmol/L (ref 22–32)
CREATININE: 0.65 mg/dL (ref 0.44–1.00)
Calcium: 8.6 mg/dL — ABNORMAL LOW (ref 8.9–10.3)
Chloride: 108 mmol/L (ref 101–111)
GFR calc Af Amer: >60 mL/min (ref >60)
Glucose, Bld: 99 mg/dL (ref 65–99)
POTASSIUM: 4 mmol/L (ref 3.5–5.1)
SODIUM: 137 mmol/L (ref 135–145)
Total Protein: 7.1 g/dL (ref 6.5–8.1)

## 2015-07-21 DIAGNOSIS — Z23 Encounter for immunization: Secondary | ICD-10-CM | POA: Diagnosis not present

## 2015-08-26 DIAGNOSIS — I10 Essential (primary) hypertension: Secondary | ICD-10-CM | POA: Diagnosis not present

## 2015-08-26 DIAGNOSIS — M25561 Pain in right knee: Secondary | ICD-10-CM | POA: Diagnosis not present

## 2015-08-26 DIAGNOSIS — M25562 Pain in left knee: Secondary | ICD-10-CM | POA: Diagnosis not present

## 2015-08-26 DIAGNOSIS — M25569 Pain in unspecified knee: Secondary | ICD-10-CM | POA: Diagnosis not present

## 2015-08-26 DIAGNOSIS — M545 Low back pain: Secondary | ICD-10-CM | POA: Diagnosis not present

## 2015-08-26 DIAGNOSIS — E559 Vitamin D deficiency, unspecified: Secondary | ICD-10-CM | POA: Diagnosis not present

## 2016-01-13 DIAGNOSIS — I10 Essential (primary) hypertension: Secondary | ICD-10-CM | POA: Diagnosis not present

## 2016-01-13 DIAGNOSIS — J00 Acute nasopharyngitis [common cold]: Secondary | ICD-10-CM | POA: Diagnosis not present

## 2016-01-13 DIAGNOSIS — J029 Acute pharyngitis, unspecified: Secondary | ICD-10-CM | POA: Diagnosis not present

## 2016-04-08 ENCOUNTER — Emergency Department (HOSPITAL_COMMUNITY)
Admission: EM | Admit: 2016-04-08 | Discharge: 2016-04-08 | Disposition: A | Discharge status: Home / Self Care | Payer: Medicare Other | Attending: Emergency Medicine | Admitting: Emergency Medicine

## 2016-04-08 DIAGNOSIS — H9311 Tinnitus, right ear: Secondary | ICD-10-CM | POA: Insufficient documentation

## 2016-04-08 DIAGNOSIS — Z7952 Long term (current) use of systemic steroids: Secondary | ICD-10-CM | POA: Insufficient documentation

## 2016-04-08 DIAGNOSIS — R030 Elevated blood-pressure reading, without diagnosis of hypertension: Secondary | ICD-10-CM | POA: Diagnosis not present

## 2016-04-08 DIAGNOSIS — Z79899 Other long term (current) drug therapy: Secondary | ICD-10-CM | POA: Insufficient documentation

## 2016-04-08 DIAGNOSIS — Z7982 Long term (current) use of aspirin: Secondary | ICD-10-CM | POA: Insufficient documentation

## 2016-04-08 DIAGNOSIS — H9201 Otalgia, right ear: Secondary | ICD-10-CM | POA: Diagnosis present

## 2016-04-08 DIAGNOSIS — Z79891 Long term (current) use of opiate analgesic: Secondary | ICD-10-CM | POA: Insufficient documentation

## 2016-04-08 DIAGNOSIS — I10 Essential (primary) hypertension: Secondary | ICD-10-CM | POA: Diagnosis not present

## 2016-04-08 MED ORDER — CLONAZEPAM 0.5 MG PO TABS: 0.2500 mg | ORAL_TABLET | Freq: Two times a day (BID) | ORAL | Status: DC | PRN

## 2016-04-08 NOTE — Discharge Instructions (Signed)
Tinnitus  Tinnitus refers to hearing a sound when there is no actual source for that sound. This is often described as ringing in the ears. However, people with this condition may hear a variety of noises. A person may hear the sound in one ear or in both ears.   The sounds of tinnitus can be soft, loud, or somewhere in between. Tinnitus can last for a few seconds or can be constant for days. It may go away without treatment and come back at various times. When tinnitus is constant or happens often, it can lead to other problems, such as trouble sleeping and trouble concentrating.  Almost everyone experiences tinnitus at some point. Tinnitus that is long-lasting (chronic) or comes back often is a problem that may require medical attention.   CAUSES   The cause of tinnitus is often not known. In some cases, it can result from other problems or conditions, including:   · Exposure to loud noises from machinery, music, or other sources.  · Hearing loss.  · Ear or sinus infections.  · Earwax buildup.  · A foreign object in the ear.  · Use of certain medicines.  · Use of alcohol and caffeine.  · High blood pressure.  · Heart diseases.  · Anemia.  · Allergies.  · Meniere disease.  · Thyroid problems.  · Tumors.  · An enlarged part of a weakened blood vessel (aneurysm).  SYMPTOMS  The main symptom of tinnitus is hearing a sound when there is no source for that sound. It may sound like:   · Buzzing.  · Roaring.  · Ringing.  · Blowing air, similar to the sound heard when you listen to a seashell.  · Hissing.  · Whistling.  · Sizzling.  · Humming.  · Running water.  · A sustained musical note.  DIAGNOSIS   Tinnitus is diagnosed based on your symptoms. Your health care provider will do a physical exam. A comprehensive hearing exam (audiologic exam) will be done if your tinnitus:   · Affects only one ear (unilateral).  · Causes hearing difficulties.  · Lasts 6 months or longer.  You may also need to see a health care provider  who specializes in hearing disorders (audiologist). You may be asked to complete a questionnaire to determine the severity of your tinnitus. Tests may be done to help determine the cause and to rule out other conditions. These can include:  · Imaging studies of your head and brain, such as:    A CT scan.    An MRI.  · An imaging study of your blood vessels (angiogram).  TREATMENT   Treating an underlying medical condition can sometimes make tinnitus go away. If your tinnitus continues, other treatments may include:  · Medicines, such as certain antidepressants or sleeping aids.  · Sound generators to mask the tinnitus. These include:  ¨ Tabletop sound machines that play relaxing sounds to help you fall asleep.  ¨ Wearable devices that fit in your ear and play sounds or music.  ¨ A small device that uses headphones to deliver a signal embedded in music (acoustic neural stimulation). In time, this may change the pathways of your brain and make you less sensitive to tinnitus. This device is used for very severe cases when no other treatment is working.  · Therapy and counseling to help you manage the stress of living with tinnitus.  · Using hearing aids or cochlear implants, if your tinnitus is related to hearing   or deep breathing.  Use a white noise machine, a humidifier, or other devices to mask the sound of tinnitus.  Sleep with your head slightly raised. This may reduce the impact of tinnitus.  Try to get plenty of rest each night. SEEK MEDICAL CARE IF:  You have tinnitus in just one ear.  Your tinnitus continues for 3 weeks or longer without stopping.  Home care measures are not  helping.  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  You have tinnitus along with any of the following:  Dizziness.  Loss of balance.  Nausea and vomiting.   This information is not intended to replace advice given to you by your health care provider. Make sure you discuss any questions you have with your health care provider.   Document Released: 11/14/2005 Document Revised: 12/05/2014 Document Reviewed: 04/16/2014 Elsevier Interactive Patient Education 2016 [employer]  Clonazepam tablets What is this medicine? CLONAZEPAM (kloe NA ze pam) is a benzodiazepine. It is used to treat certain types of seizures. It is also used to treat panic disorder. This medicine may be used for other purposes; ask your health care provider or pharmacist if you have questions. What should I tell my health care provider before I take this medicine? They need to know if you have any of these conditions: -an alcohol or drug abuse problem -bipolar disorder, depression, psychosis or other mental health condition -glaucoma -kidney or liver disease -lung or breathing disease -myasthenia gravis -Parkinson's disease -porphyria -seizures or a history of seizures -suicidal thoughts -an unusual or allergic reaction to clonazepam, other benzodiazepines, foods, dyes, or preservatives -pregnant or trying to get pregnant -breast-feeding How should I use this medicine? Take this medicine by mouth with a glass of water. Follow the directions on the prescription label. If it upsets your stomach, take it with food or milk. Take your medicine at regular intervals. Do not take it more often than directed. Do not stop taking or change the dose except on the advice of your doctor or health care professional. A special MedGuide will be given to you by the pharmacist with each prescription and refill. Be sure to read this information carefully each time. Talk to your pediatrician regarding the use of this medicine in  children. Special care may be needed. Overdosage: If you think you have taken too much of this medicine contact a poison control center or emergency room at once. NOTE: This medicine is only for you. Do not share this medicine with others. What if I miss a dose? If you miss a dose, take it as soon as you can. If it is almost time for your next dose, take only that dose. Do not take double or extra doses. What may interact with this medicine? -herbal or dietary supplements -medicines for depression, anxiety, or psychotic disturbances -medicines for fungal infections like fluconazole, itraconazole, ketoconazole, voriconazole -medicines for HIV infection or AIDS -medicines for sleep -prescription pain medicines -propantheline -rifampin -sevelamer -some medicines for seizures like carbamazepine, phenobarbital, phenytoin, primidone This list may not describe all possible interactions. Give your health care provider a list of all the medicines, herbs, non-prescription drugs, or dietary supplements you use. Also tell them if you smoke, drink alcohol, or use illegal drugs. Some items may interact with your medicine. What should I watch for while using this medicine? Visit your doctor or health care professional for regular checks on your progress. Your body may become dependent on this medicine. If you have been taking this medicine  regularly for some time, do not suddenly stop taking it. You must gradually reduce the dose or you may get severe side effects. Ask your doctor or health care professional for advice before increasing or decreasing the dose. Even after you stop taking this medicine it can still affect your body for several days. If you suffer from several types of seizures, this medicine may increase the chance of grand mal seizures (epilepsy). Let your doctor or health care professional know, he or she may want to prescribe an additional medicine. You may get drowsy or dizzy. Do not drive,  use machinery, or do anything that needs mental alertness until you know how this medicine affects you. To reduce the risk of dizzy and fainting spells, do not stand or sit up quickly, especially if you are an older patient. Alcohol may increase dizziness and drowsiness. Avoid alcoholic drinks. Do not treat yourself for coughs, colds or allergies without asking your doctor or health care professional for advice. Some ingredients can increase possible side effects. The use of this medicine may increase the chance of suicidal thoughts or actions. Pay special attention to how you are responding while on this medicine. Any worsening of mood, or thoughts of suicide or dying should be reported to your health care professional right away. Women who become pregnant while using this medicine may enroll in the [place] Pregnancy Registry by calling [phone]. This registry collects information about the safety of antiepileptic drug use during pregnancy. What side effects may I notice from receiving this medicine? Side effects that you should report to your doctor or health care professional as soon as possible: -allergic reactions like skin rash, itching or hives, swelling of the face, lips, or tongue -changes in vision -confusion -depression -hallucinations -mood changes, excitability or aggressive behavior -movement difficulty, staggering or jerky movements -muscle cramps, weakness -tremors -unusual eye movements Side effects that usually do not require medical attention (report to your doctor or health care professional if they continue or are bothersome): -constipation or diarrhea -difficulty sleeping, nightmares -dizziness, drowsiness -headache -increased saliva from your mouth -nausea, vomiting This list may not describe all possible side effects. Call your doctor for medical advice about side effects. You may report side effects to FDA at 1-800-FDA-1088. Where  should I keep my medicine? Keep out of the reach of children. This medicine can be abused. Keep your medicine in a safe place to protect it from theft. Do not share this medicine with anyone. Selling or giving away this medicine is dangerous and against the law. This medicine may cause accidental overdose and death if taken by other adults, children, or pets. Mix any unused medicine with a substance like cat litter or coffee grounds. Then throw the medicine away in a sealed container like a sealed bag or a coffee can with a lid. Do not use the medicine after the expiration date. Store at room temperature between 15 and 30 degrees C (59 and 86 degrees F). Protect from light. Keep container tightly closed. NOTE: This sheet is a summary. It may not cover all possible information. If you have questions about this medicine, talk to your doctor, pharmacist, or health care provider.    2016, Elsevier/Gold Standard. (2015-02-24 14:01:43)

## 2016-04-08 NOTE — ED Notes (Signed)
Patient is complaining of ear pain for two weeks

## 2016-04-08 NOTE — ED Provider Notes (Signed)
CSN: [id number]     Arrival date & time 04/08/16  0039 History  By signing my name below, I, Altamease Oiler, attest that this documentation has been prepared under the direction and in the presence of Delora Fuel, MD. Electronically Signed: Altamease Oiler, ED Scribe. 04/08/2016. 2:43 AM    Chief Complaint  Patient presents with  . Otalgia   The history is provided by the patient. No language interpreter was used.   Allison Franco is a 75 y.o. female who presents to the Emergency Department complaining of a recurrent, constant, "hissing noise" in the right ear with onset 2 weeks ago. Pt states that she has had similar symptoms intermittently over the last 2 years since starting clonazepam. She states that her symptoms occur only when she stops the medication and her last dose was 2 months ago. She states that she lowered her dose of the medication out of concern for addiction. Pt denies ear pain and hearing loss. She has been seen by ENT for her symptoms and told that there was nothing that they could do.    Past Medical History  Diagnosis Date  . Hypertension    Past Surgical History  Procedure Laterality Date  . Fribroid surgery    . Abdominal hysterectomy     No family history on file. Social History  Substance Use Topics  . Smoking status: Never Smoker   . Smokeless tobacco: Not on file  . Alcohol Use: No   OB History    No data available     Review of Systems  HENT: Negative for ear pain and hearing loss.        Noise in the right ear   All other systems reviewed and are negative.  Allergies  Aspirin  Home Medications   Prior to Admission medications   Medication Sig Start Date End Date Taking? Authorizing Provider  acetaminophen (TYLENOL) 325 MG tablet Take 325-650 mg by mouth every 6 (six) hours as needed for mild pain, moderate pain or headache.    Historical Provider, MD  aspirin EC 81 MG tablet Take 81 mg by mouth daily.      Historical Provider, MD  clonazePAM  (KLONOPIN) 0.5 MG tablet Take 1 tablet (0.5 mg total) by mouth 2 (two) times daily. 10/21/13   Thurnell Lose, MD  diazepam (VALIUM) 2 MG tablet Take 1 tablet (2 mg total) by mouth every 6 (six) hours as needed for anxiety or muscle spasms. 05/17/15   Lacretia Leigh, MD  diclofenac sodium (VOLTAREN) 1 % GEL Apply 1 application topically 4 (four) times daily. 05/12/15   Janne Napoleon, NP  lisinopril (PRINIVIL,ZESTRIL) 10 MG tablet Take 10 mg by mouth at bedtime.    Historical Provider, MD  Multiple Vitamin (MULTI-VITAMIN DAILY PO) Take 1 tablet by mouth daily.    Historical Provider, MD  oxyCODONE-acetaminophen (PERCOCET) 7.5-325 MG per tablet Take 1 tablet by mouth every 4 (four) hours as needed for moderate pain or severe pain. 05/17/15   Lacretia Leigh, MD  predniSONE (DELTASONE) 20 MG tablet Take 2 tablets (40 mg total) by mouth daily. 05/17/15   Lacretia Leigh, MD   BP 162/89 mmHg  Pulse 96  Temp(Src) 97.8 F (36.6 C) (Oral)  Resp 20  SpO2 98% Physical Exam  Constitutional: She is oriented to person, place, and time. She appears well-developed and well-nourished. No distress.  HENT:  Head: Normocephalic and atraumatic.  Eyes: EOM are normal. Pupils are equal, round, and reactive to light.  Neck:  Normal range of motion. Neck supple. No JVD present.  Cardiovascular: Normal rate, regular rhythm and normal heart sounds.   No murmur heard. Pulmonary/Chest: Effort normal and breath sounds normal. She has no wheezes. She has no rales. She exhibits no tenderness.  Abdominal: Soft. Bowel sounds are normal. She exhibits no distension and no mass. There is no tenderness.  Musculoskeletal: Normal range of motion. She exhibits no edema.  Lymphadenopathy:    She has no cervical adenopathy.  Neurological: She is alert and oriented to person, place, and time. No cranial nerve deficit. She exhibits normal muscle tone. Coordination normal.  Skin: Skin is warm and dry. No rash noted.  Psychiatric: She has a  normal mood and affect. Her behavior is normal. Judgment and thought content normal.  Nursing note and vitals reviewed.   ED Course  Procedures (including critical care time) DIAGNOSTIC STUDIES: Oxygen Saturation is 98% on RA,  normal by my interpretation.    COORDINATION OF CARE: 2:39 AM Discussed treatment plan which includes discharge with clonazepam with pt at bedside and pt agreed to plan.   MDM   Final diagnoses:  Tinnitus aurium, right    Tinnitus of the right ear. Old records are reviewed and she has 3 prior ED visits for tinnitus. She states that clonazepam in low doses has been effective and giving her relief. Reviewed her record on the [place] a controlled substance reporting website and she has not had excessive prescriptions for controlled substances. She is given a referral to ENT and is given a prescription for clonazepam.  I personally performed the services described in this documentation, which was scribed in my presence. The recorded information has been reviewed and is accurate.      Delora Fuel, MD [id number] [id number]

## 2016-04-08 NOTE — ED Notes (Signed)
Bed: [facility] Expected date:  Expected time:  Means of arrival:  Comments: EMS 75 yo female with tinnitus

## 2016-04-11 DIAGNOSIS — R109 Unspecified abdominal pain: Secondary | ICD-10-CM | POA: Diagnosis not present

## 2016-04-11 DIAGNOSIS — H9311 Tinnitus, right ear: Secondary | ICD-10-CM | POA: Diagnosis not present

## 2016-04-11 DIAGNOSIS — K5900 Constipation, unspecified: Secondary | ICD-10-CM | POA: Diagnosis not present

## 2016-04-11 DIAGNOSIS — Z09 Encounter for follow-up examination after completed treatment for conditions other than malignant neoplasm: Secondary | ICD-10-CM | POA: Diagnosis not present

## 2016-04-26 ENCOUNTER — Encounter (HOSPITAL_COMMUNITY): Payer: Self-pay | Admitting: Emergency Medicine

## 2016-04-26 ENCOUNTER — Emergency Department (HOSPITAL_COMMUNITY)
Admission: EM | Admit: 2016-04-26 | Discharge: 2016-04-26 | Disposition: A | Discharge status: Home / Self Care | Payer: Medicare Other | Attending: Emergency Medicine | Admitting: Emergency Medicine

## 2016-04-26 DIAGNOSIS — H9311 Tinnitus, right ear: Secondary | ICD-10-CM | POA: Insufficient documentation

## 2016-04-26 DIAGNOSIS — Z79891 Long term (current) use of opiate analgesic: Secondary | ICD-10-CM | POA: Diagnosis not present

## 2016-04-26 DIAGNOSIS — H9209 Otalgia, unspecified ear: Secondary | ICD-10-CM | POA: Diagnosis not present

## 2016-04-26 DIAGNOSIS — G4700 Insomnia, unspecified: Secondary | ICD-10-CM | POA: Diagnosis not present

## 2016-04-26 DIAGNOSIS — Z7982 Long term (current) use of aspirin: Secondary | ICD-10-CM | POA: Insufficient documentation

## 2016-04-26 DIAGNOSIS — H9319 Tinnitus, unspecified ear: Secondary | ICD-10-CM | POA: Diagnosis present

## 2016-04-26 DIAGNOSIS — I10 Essential (primary) hypertension: Secondary | ICD-10-CM | POA: Diagnosis not present

## 2016-04-26 MED ORDER — HYDROXYZINE HCL 25 MG PO TABS: 25.0000 mg | ORAL_TABLET | Freq: Every day | ORAL | Status: DC

## 2016-04-26 NOTE — ED Notes (Addendum)
Pt BIB EMS from home; pt c/o "hissing" noise in ears bilaterally; denies N/V/D, SOB, chest pain, dizziness; hx of HTN; pt has OTC tinnitus medication but has not been taking it consistently; pt took tinnitus medication when EMS arrived and then was transported to ER; pt states she has had this issue on and off since she was put on clonazepam in 2014.

## 2016-04-26 NOTE — ED Provider Notes (Signed)
CSN: [id number]     Arrival date & time 04/26/16  [id number] History   First MD Initiated Contact with Patient 04/26/16 [phone]     Chief Complaint  Patient presents with  . Tinnitus      HPI  Pt presents for evaluation of tinnitis that has been present for over a year.  Third ER visit.  Last ER visit 5/12 and had Clonazepam rx renewed.  His been seen by ENT with full evaluation and no treatment offered, as would be expected for undifferentiated tendinitis.  She was concerned that she can't sleep. Has been tapering on 2 fragments of her pill of clonazepam. Previously seen by Dr. Vanessa [place] primary [facility]. States she has a new primary care physician that she has not seen yet. Thus, she does not discuss her tinnitus with anyone outside of the emergency room or ENT.   Past Medical History  Diagnosis Date  . Hypertension    Past Surgical History  Procedure Laterality Date  . Fribroid surgery    . Abdominal hysterectomy     No family history on file. Social History  Substance Use Topics  . Smoking status: Never Smoker   . Smokeless tobacco: None  . Alcohol Use: No   OB History    No data available     Review of Systems  Constitutional: Negative for fever, chills, diaphoresis, appetite change and fatigue.  HENT: Negative for mouth sores, sore throat and trouble swallowing.        Tinnitus. No hearing loss. No ear pain.  Eyes: Negative for visual disturbance.  Respiratory: Negative for cough, chest tightness, shortness of breath and wheezing.   Cardiovascular: Negative for chest pain.  Gastrointestinal: Negative for nausea, vomiting, abdominal pain, diarrhea and abdominal distention.  Endocrine: Negative for polydipsia, polyphagia and polyuria.  Genitourinary: Negative for dysuria, frequency and hematuria.  Musculoskeletal: Negative for gait problem.  Skin: Negative for color change, pallor and rash.  Neurological: Negative for dizziness, syncope, light-headedness and headaches.   Hematological: Does not bruise/bleed easily.  Psychiatric/Behavioral: Negative for behavioral problems and confusion.      Allergies  Aspirin  Home Medications   Prior to Admission medications   Medication Sig Start Date End Date Taking? Authorizing Provider  acetaminophen (TYLENOL) 325 MG tablet Take 325-650 mg by mouth every 6 (six) hours as needed for mild pain, moderate pain or headache.    Historical Provider, MD  aspirin EC 81 MG tablet Take 81 mg by mouth daily.      Historical Provider, MD  clonazePAM (KLONOPIN) 0.5 MG tablet Take 0.5-1 tablets (0.25-0.5 mg total) by mouth 2 (two) times daily as needed for anxiety (or tinnitus). [id number]   Delora Fuel, MD  diazepam (VALIUM) 2 MG tablet Take 1 tablet (2 mg total) by mouth every 6 (six) hours as needed for anxiety or muscle spasms. 05/17/15   Lacretia Leigh, MD  diclofenac sodium (VOLTAREN) 1 % GEL Apply 1 application topically 4 (four) times daily. 05/12/15   Janne Napoleon, NP  hydrOXYzine (ATARAX/VISTARIL) 25 MG tablet Take 1 tablet (25 mg total) by mouth at bedtime. 04/26/16   Tanna Furry, MD  lisinopril (PRINIVIL,ZESTRIL) 10 MG tablet Take 10 mg by mouth at bedtime.    Historical Provider, MD  Multiple Vitamin (MULTI-VITAMIN DAILY PO) Take 1 tablet by mouth daily.    Historical Provider, MD  oxyCODONE-acetaminophen (PERCOCET) 7.5-325 MG per tablet Take 1 tablet by mouth every 4 (four) hours as needed for moderate pain or severe pain. 05/17/15  Lacretia Leigh, MD  predniSONE (DELTASONE) 20 MG tablet Take 2 tablets (40 mg total) by mouth daily. 05/17/15   Lacretia Leigh, MD   BP 150/85 mmHg  Pulse 93  Temp(Src) 97.7 F (36.5 C) (Oral)  Resp 16  SpO2 98% Physical Exam  Constitutional: She is oriented to person, place, and time. She appears well-developed and well-nourished. No distress.  HENT:  Head: Normocephalic.  Eyes: Conjunctivae are normal. Pupils are equal, round, and reactive to light. No scleral icterus.  Neck: Normal range  of motion. Neck supple. No thyromegaly present.  Cardiovascular: Normal rate and regular rhythm.  Exam reveals no gallop and no friction rub.   No murmur heard. Pulmonary/Chest: Effort normal and breath sounds normal. No respiratory distress. She has no wheezes. She has no rales.  Abdominal: Soft. Bowel sounds are normal. She exhibits no distension. There is no tenderness. There is no rebound.  Musculoskeletal: Normal range of motion.  Neurological: She is alert and oriented to person, place, and time.  Skin: Skin is warm and dry. No rash noted.  Psychiatric: She has a normal mood and affect. Her behavior is normal.    ED Course  Procedures (including critical care time) Labs Review Labs Reviewed - No data to display  Imaging Review No results found. I have personally reviewed and evaluated these images and lab results as part of my medical decision-making.   EKG Interpretation None      MDM   Final diagnoses:  Tinnitus, right  Insomnia    I discussed with the patient the chronic nature of tinnitus and the lack of effective treatment.  I discussed that EMS transport should be reserved for acute conditions.  I have declined Rxing clonazepam 2/2 its addictive potential.  I defer this to her PCP.  She has a "New" PCP whom she hasnt seen yet.  RX Atarax for qhs prn.    Tanna Furry, MD 04/26/16 [phone]

## 2016-04-26 NOTE — Discharge Instructions (Signed)
Tinnitus Tinnitus refers to hearing a sound when there is no actual source for that sound. This is often described as ringing in the ears. However, people with this condition may hear a variety of noises. A person may hear the sound in one ear or in both ears.  The sounds of tinnitus can be soft, loud, or somewhere in between. Tinnitus can last for a few seconds or can be constant for days. It may go away without treatment and come back at various times. When tinnitus is constant or happens often, it can lead to other problems, such as trouble sleeping and trouble concentrating. Almost everyone experiences tinnitus at some point. Tinnitus that is long-lasting (chronic) or comes back often is a problem that may require medical attention.  CAUSES  The cause of tinnitus is often not known. In some cases, it can result from other problems or conditions, including:   Exposure to loud noises from machinery, music, or other sources.  Hearing loss.  Ear or sinus infections.  Earwax buildup.  A foreign object in the ear.  Use of certain medicines.  Use of alcohol and caffeine.  High blood pressure.  Heart diseases.  Anemia.  Allergies.  Meniere disease.  Thyroid problems.  Tumors.  An enlarged part of a weakened blood vessel (aneurysm). SYMPTOMS The main symptom of tinnitus is hearing a sound when there is no source for that sound. It may sound like:   Buzzing.  Roaring.  Ringing.  Blowing air, similar to the sound heard when you listen to a seashell.  Hissing.  Whistling.  Sizzling.  Humming.  Running water.  A sustained musical note. DIAGNOSIS  Tinnitus is diagnosed based on your symptoms. Your health care provider will do a physical exam. A comprehensive hearing exam (audiologic exam) will be done if your tinnitus:   Affects only one ear (unilateral).  Causes hearing difficulties.  Lasts 6 months or longer. You may also need to see a health care provider  who specializes in hearing disorders (audiologist). You may be asked to complete a questionnaire to determine the severity of your tinnitus. Tests may be done to help determine the cause and to rule out other conditions. These can include:  Imaging studies of your head and brain, such as:  A CT scan.  An MRI.  An imaging study of your blood vessels (angiogram). TREATMENT  Treating an underlying medical condition can sometimes make tinnitus go away. If your tinnitus continues, other treatments may include:  Medicines, such as certain antidepressants or sleeping aids.  Sound generators to mask the tinnitus. These include:  Tabletop sound machines that play relaxing sounds to help you fall asleep.  Wearable devices that fit in your ear and play sounds or music.  A small device that uses headphones to deliver a signal embedded in music (acoustic neural stimulation). In time, this may change the pathways of your brain and make you less sensitive to tinnitus. This device is used for very severe cases when no other treatment is working.  Therapy and counseling to help you manage the stress of living with tinnitus.  Using hearing aids or cochlear implants, if your tinnitus is related to hearing loss. HOME CARE INSTRUCTIONS  When possible, avoid being in loud places and being exposed to loud sounds.  Wear hearing protection, such as earplugs, when you are exposed to loud noises.  Do not take stimulants, such as nicotine, alcohol, or caffeine.  Practice techniques for reducing stress, such as meditation, yoga,  or deep breathing.  Use a white noise machine, a humidifier, or other devices to mask the sound of tinnitus.  Sleep with your head slightly raised. This may reduce the impact of tinnitus.  Try to get plenty of rest each night. SEEK MEDICAL CARE IF:  You have tinnitus in just one ear.  Your tinnitus continues for 3 weeks or longer without stopping.  Home care measures are not  helping.  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  You have tinnitus along with any of the following:  Dizziness.  Loss of balance.  Nausea and vomiting.   This information is not intended to replace advice given to you by your health care provider. Make sure you discuss any questions you have with your health care provider.   Document Released: 11/14/2005 Document Revised: 12/05/2014 Document Reviewed: 04/16/2014 Elsevier Interactive Patient Education 2016 [employer]  Insomnia Insomnia is a sleep disorder that makes it difficult to fall asleep or to stay asleep. Insomnia can cause tiredness (fatigue), low energy, difficulty concentrating, mood swings, and poor performance at work or school.  There are three different ways to classify insomnia:  Difficulty falling asleep.  Difficulty staying asleep.  Waking up too early in the morning. Any type of insomnia can be long-term (chronic) or short-term (acute). Both are common. Short-term insomnia usually lasts for three months or less. Chronic insomnia occurs at least three times a week for longer than three months. CAUSES  Insomnia may be caused by another condition, situation, or substance, such as:  Anxiety.  Certain medicines.  Gastroesophageal reflux disease (GERD) or other gastrointestinal conditions.  Asthma or other breathing conditions.  Restless legs syndrome, sleep apnea, or other sleep disorders.  Chronic pain.  Menopause. This may include hot flashes.  Stroke.  Abuse of alcohol, tobacco, or illegal drugs.  Depression.  Caffeine.   Neurological disorders, such as Alzheimer disease.  An overactive thyroid (hyperthyroidism). The cause of insomnia may not be known. RISK FACTORS Risk factors for insomnia include:  Gender. Women are more commonly affected than men.  Age. Insomnia is more common as you get older.  Stress. This may involve your professional or personal life.  Income. Insomnia is  more common in people with lower income.  Lack of exercise.   Irregular work schedule or night shifts.  Traveling between different time zones. SIGNS AND SYMPTOMS If you have insomnia, trouble falling asleep or trouble staying asleep is the main symptom. This may lead to other symptoms, such as:  Feeling fatigued.  Feeling nervous about going to sleep.  Not feeling rested in the morning.  Having trouble concentrating.  Feeling irritable, anxious, or depressed. TREATMENT  Treatment for insomnia depends on the cause. If your insomnia is caused by an underlying condition, treatment will focus on addressing the condition. Treatment may also include:   Medicines to help you sleep.  Counseling or therapy.  Lifestyle adjustments. HOME CARE INSTRUCTIONS   Take medicines only as directed by your health care provider.  Keep regular sleeping and waking hours. Avoid naps.  Keep a sleep diary to help you and your health care provider figure out what could be causing your insomnia. Include:   When you sleep.  When you wake up during the night.  How well you sleep.   How rested you feel the next day.  Any side effects of medicines you are taking.  What you eat and drink.   Make your bedroom a comfortable place where it is easy  to fall asleep:  Put up shades or special blackout curtains to block light from outside.  Use a white noise machine to block noise.  Keep the temperature cool.   Exercise regularly as directed by your health care provider. Avoid exercising right before bedtime.  Use relaxation techniques to manage stress. Ask your health care provider to suggest some techniques that may work well for you. These may include:  Breathing exercises.  Routines to release muscle tension.  Visualizing peaceful scenes.  Cut back on alcohol, caffeinated beverages, and cigarettes, especially close to bedtime. These can disrupt your sleep.  Do not overeat or eat  spicy foods right before bedtime. This can lead to digestive discomfort that can make it hard for you to sleep.  Limit screen use before bedtime. This includes:  Watching TV.  Using your smartphone, tablet, and computer.  Stick to a routine. This can help you fall asleep faster. Try to do a quiet activity, brush your teeth, and go to bed at the same time each night.  Get out of bed if you are still awake after 15 minutes of trying to sleep. Keep the lights down, but try reading or doing a quiet activity. When you feel sleepy, go back to bed.  Make sure that you drive carefully. Avoid driving if you feel very sleepy.  Keep all follow-up appointments as directed by your health care provider. This is important. SEEK MEDICAL CARE IF:   You are tired throughout the day or have trouble in your daily routine due to sleepiness.  You continue to have sleep problems or your sleep problems get worse. SEEK IMMEDIATE MEDICAL CARE IF:   You have serious thoughts about hurting yourself or someone else.   This information is not intended to replace advice given to you by your health care provider. Make sure you discuss any questions you have with your health care provider.   Document Released: 11/11/2000 Document Revised: 08/05/2015 Document Reviewed: 08/15/2014 Elsevier Interactive Patient Education [employer].

## 2016-04-26 NOTE — ED Notes (Signed)
Bed: [id number] Expected date:  Expected time:  Means of arrival:  Comments: 75 yo F  Ringing in ears

## 2016-05-01 ENCOUNTER — Encounter (HOSPITAL_COMMUNITY): Payer: Self-pay

## 2016-05-01 ENCOUNTER — Emergency Department (HOSPITAL_COMMUNITY)
Admission: EM | Admit: 2016-05-01 | Discharge: 2016-05-02 | Disposition: A | Discharge status: Home / Self Care | Payer: Medicare Other | Attending: Emergency Medicine | Admitting: Emergency Medicine

## 2016-05-01 DIAGNOSIS — Z7982 Long term (current) use of aspirin: Secondary | ICD-10-CM | POA: Insufficient documentation

## 2016-05-01 DIAGNOSIS — Z7952 Long term (current) use of systemic steroids: Secondary | ICD-10-CM | POA: Insufficient documentation

## 2016-05-01 DIAGNOSIS — H9311 Tinnitus, right ear: Secondary | ICD-10-CM | POA: Diagnosis not present

## 2016-05-01 DIAGNOSIS — Z791 Long term (current) use of non-steroidal anti-inflammatories (NSAID): Secondary | ICD-10-CM | POA: Insufficient documentation

## 2016-05-01 DIAGNOSIS — H9319 Tinnitus, unspecified ear: Secondary | ICD-10-CM | POA: Diagnosis present

## 2016-05-01 DIAGNOSIS — I10 Essential (primary) hypertension: Secondary | ICD-10-CM | POA: Insufficient documentation

## 2016-05-01 DIAGNOSIS — Z79899 Other long term (current) drug therapy: Secondary | ICD-10-CM | POA: Diagnosis not present

## 2016-05-01 NOTE — ED Provider Notes (Signed)
CSN: [id number]     Arrival date & time 05/01/16  2046 History  By signing my name below, I, [facility], attest that this documentation has been prepared under the direction and in the presence of Merryl Hacker, MD. Electronically Signed: Virgel Bouquet, ED Scribe. 05/02/2016. 12:01 AM.     Chief Complaint  Patient presents with  . Tinnitus    The history is provided by the patient. No language interpreter was used.   HPI Comments: [facility] is a 75 y.o. female with an hx of HTN and anxiety who presents to the Emergency Department complaining of constant, moderate tinnitus ongoing for the past year and worse in the past week. Pt states that she stopped taking clonazepam 1 week ago with a worsening of her tinnitus. Pt reports sleep disturbance secondary to the tinnitus and constipation. She takes aspirin daily. Per pt, she has been evaluated multiple times for the same complaint by her PCP and an ENT specialist who were unable to determine the cause of her tinnitus and scheduled her for a follow-up appointment in 2 months. No increasing asa use.  Denies HA, ear pain, speech difficulty, difficulty ambulating, hearing loss, hearing difficulty, or any other complaint currently.  I reviewed the patient's chart. Tinnitus appears chronic. Was seen in 2015 multiple times and has had 2 recent ER visits. She is very frustrated and states "I need help now."  Past Medical History  Diagnosis Date  . Hypertension    Past Surgical History  Procedure Laterality Date  . Fribroid surgery    . Abdominal hysterectomy     History reviewed. No pertinent family history. Social History  Substance Use Topics  . Smoking status: Never Smoker   . Smokeless tobacco: None  . Alcohol Use: No   OB History    No data available     Review of Systems  HENT: Positive for tinnitus. Negative for ear pain and hearing loss.   Neurological: Negative for facial asymmetry, speech difficulty, weakness  and headaches.  Psychiatric/Behavioral: Positive for sleep disturbance.  All other systems reviewed and are negative.     Allergies  Aspirin and Hydroxyzine  Home Medications   Prior to Admission medications   Medication Sig Start Date End Date Taking? Authorizing Provider  acetaminophen (TYLENOL) 325 MG tablet Take 325-650 mg by mouth every 6 (six) hours as needed for mild pain, moderate pain or headache.   Yes Historical Provider, MD  aspirin EC 81 MG tablet Take 81 mg by mouth daily.     Yes Historical Provider, MD  clonazePAM (KLONOPIN) 0.5 MG tablet Take 0.5-1 tablets (0.25-0.5 mg total) by mouth 2 (two) times daily as needed for anxiety (or tinnitus). [id number]  Yes Delora Fuel, MD  hydrOXYzine (ATARAX/VISTARIL) 25 MG tablet Take 1 tablet (25 mg total) by mouth at bedtime. 04/26/16  Yes Tanna Furry, MD  lisinopril (PRINIVIL,ZESTRIL) 10 MG tablet Take 10 mg by mouth at bedtime.   Yes Historical Provider, MD  Multiple Vitamin (MULTI-VITAMIN DAILY PO) Take 1 tablet by mouth daily.   Yes Historical Provider, MD  diazepam (VALIUM) 2 MG tablet Take 1 tablet (2 mg total) by mouth every 6 (six) hours as needed for anxiety or muscle spasms. 05/17/15   Lacretia Leigh, MD  diclofenac sodium (VOLTAREN) 1 % GEL Apply 1 application topically 4 (four) times daily. 05/12/15   Janne Napoleon, NP  oxyCODONE-acetaminophen (PERCOCET) 7.5-325 MG per tablet Take 1 tablet by mouth every 4 (four) hours as needed for  moderate pain or severe pain. 05/17/15   Lacretia Leigh, MD  predniSONE (DELTASONE) 20 MG tablet Take 2 tablets (40 mg total) by mouth daily. 05/17/15   Lacretia Leigh, MD   BP 144/80 mmHg  Pulse 96  Temp(Src) 98.1 F (36.7 C) (Oral)  Resp 20  SpO2 96% Physical Exam  Constitutional: She is oriented to person, place, and time. She appears well-developed and well-nourished. No distress.  HENT:  Head: Normocephalic and atraumatic.  Mouth/Throat: Oropharynx is clear and moist.  Bilateral TMs clear   Cardiovascular: Normal rate, regular rhythm and normal heart sounds.   Pulmonary/Chest: Effort normal and breath sounds normal. No respiratory distress. She has no wheezes.  Abdominal: Soft. Bowel sounds are normal.  Neurological: She is alert and oriented to person, place, and time.  Cranial nerves II through XII intact, 5 out of 5 strength in all 470s, no drift, no dysmetria to finger-nose-finger  Skin: Skin is warm and dry.  Psychiatric: She has a normal mood and affect.  Nursing note and vitals reviewed.   ED Course  Procedures   DIAGNOSTIC STUDIES: Oxygen Saturation is 96% on RA, adequate by my interpretation.    COORDINATION OF CARE: 11:55 PM Discussed treatment plan with pt at bedside and pt agreed to plan.   Labs Review Labs Reviewed - No data to display  Imaging Review No results found. I have personally reviewed and evaluated these images and lab results as part of my medical decision-making.   EKG Interpretation None      MDM   Final diagnoses:  Tinnitus of right ear    Patient presents with chronic tinnitus of the right ear. She reports it creates chronic stress. Previously was given Vistaril and instructed that it may be helpful to get back on her clonazepam given that her symptoms worsen coming off of it; however, patient declined that. Discussed with her at length that because of the chronicity of the complaint, I would not likely find the culprit while in the ER. I have encouraged her to restart her clonazepam 0.5 mg daily at bedtime to help with her ADLs until such time as she can have further evaluation. She may need outpatient MRI to rule out organic cause given the chronicity of her headaches. This was shared with the patient. Instructed to follow-up with her primary physician to have this arranged. She is otherwise neurologically intact.  After history, exam, and medical workup I feel the patient has been appropriately medically screened and is safe for  discharge home. Pertinent diagnoses were discussed with the patient. Patient was given return precautions.  I personally performed the services described in this documentation, which was scribed in my presence. The recorded information has been reviewed and is accurate.    Merryl Hacker, MD 05/02/16 [phone]

## 2016-05-01 NOTE — ED Notes (Addendum)
Pt c/o of "the sound of constant hissing wind" in her right ear. Pt has been seen here for the same. Pt states that Rx'd meds have not resolved this problem. Pt denies ear pain bilaterally. Pt c/o this negatively  Affecting her ADLs and sleep.

## 2016-05-02 DIAGNOSIS — H9311 Tinnitus, right ear: Secondary | ICD-10-CM | POA: Diagnosis not present

## 2016-05-02 DIAGNOSIS — Z09 Encounter for follow-up examination after completed treatment for conditions other than malignant neoplasm: Secondary | ICD-10-CM | POA: Diagnosis not present

## 2016-05-02 MED ORDER — CLONAZEPAM 0.5 MG PO TABS
0.5000 mg | ORAL_TABLET | Freq: Once | ORAL | Status: AC
  Administered 2016-05-02: 0.5 mg via ORAL
  Filled 2016-05-02: qty 1

## 2016-05-02 NOTE — Discharge Instructions (Signed)
Was seen today for ringing in your right ear. You should restart her clonazepam at 0.5 mg at night.  He may need outpatient imaging including an MRI to rule out any lesion. This does not need to be done on an emergent basis.  Tinnitus Tinnitus refers to hearing a sound when there is no actual source for that sound. This is often described as ringing in the ears. However, people with this condition may hear a variety of noises. A person may hear the sound in one ear or in both ears.  The sounds of tinnitus can be soft, loud, or somewhere in between. Tinnitus can last for a few seconds or can be constant for days. It may go away without treatment and come back at various times. When tinnitus is constant or happens often, it can lead to other problems, such as trouble sleeping and trouble concentrating. Almost everyone experiences tinnitus at some point. Tinnitus that is long-lasting (chronic) or comes back often is a problem that may require medical attention.  CAUSES  The cause of tinnitus is often not known. In some cases, it can result from other problems or conditions, including:   Exposure to loud noises from machinery, music, or other sources.  Hearing loss.  Ear or sinus infections.  Earwax buildup.  A foreign object in the ear.  Use of certain medicines.  Use of alcohol and caffeine.  High blood pressure.  Heart diseases.  Anemia.  Allergies.  Meniere disease.  Thyroid problems.  Tumors.  An enlarged part of a weakened blood vessel (aneurysm). SYMPTOMS The main symptom of tinnitus is hearing a sound when there is no source for that sound. It may sound like:   Buzzing.  Roaring.  Ringing.  Blowing air, similar to the sound heard when you listen to a seashell.  Hissing.  Whistling.  Sizzling.  Humming.  Running water.  A sustained musical note. DIAGNOSIS  Tinnitus is diagnosed based on your symptoms. Your health care provider will do a physical exam. A  comprehensive hearing exam (audiologic exam) will be done if your tinnitus:   Affects only one ear (unilateral).  Causes hearing difficulties.  Lasts 6 months or longer. You may also need to see a health care provider who specializes in hearing disorders (audiologist). You may be asked to complete a questionnaire to determine the severity of your tinnitus. Tests may be done to help determine the cause and to rule out other conditions. These can include:  Imaging studies of your head and brain, such as:  A CT scan.  An MRI.  An imaging study of your blood vessels (angiogram). TREATMENT  Treating an underlying medical condition can sometimes make tinnitus go away. If your tinnitus continues, other treatments may include:  Medicines, such as certain antidepressants or sleeping aids.  Sound generators to mask the tinnitus. These include:  Tabletop sound machines that play relaxing sounds to help you fall asleep.  Wearable devices that fit in your ear and play sounds or music.  A small device that uses headphones to deliver a signal embedded in music (acoustic neural stimulation). In time, this may change the pathways of your brain and make you less sensitive to tinnitus. This device is used for very severe cases when no other treatment is working.  Therapy and counseling to help you manage the stress of living with tinnitus.  Using hearing aids or cochlear implants, if your tinnitus is related to hearing loss. HOME CARE INSTRUCTIONS  When possible, avoid  being in loud places and being exposed to loud sounds.  Wear hearing protection, such as earplugs, when you are exposed to loud noises.  Do not take stimulants, such as nicotine, alcohol, or caffeine.  Practice techniques for reducing stress, such as meditation, yoga, or deep breathing.  Use a white noise machine, a humidifier, or other devices to mask the sound of tinnitus.  Sleep with your head slightly raised. This may  reduce the impact of tinnitus.  Try to get plenty of rest each night. SEEK MEDICAL CARE IF:  You have tinnitus in just one ear.  Your tinnitus continues for 3 weeks or longer without stopping.  Home care measures are not helping.  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  You have tinnitus along with any of the following:  Dizziness.  Loss of balance.  Nausea and vomiting.   This information is not intended to replace advice given to you by your health care provider. Make sure you discuss any questions you have with your health care provider.   Document Released: 11/14/2005 Document Revised: 12/05/2014 Document Reviewed: 04/16/2014 Elsevier Interactive Patient Education [employer].

## 2016-05-04 ENCOUNTER — Emergency Department (HOSPITAL_COMMUNITY)
Admission: EM | Admit: 2016-05-04 | Discharge: 2016-05-04 | Disposition: A | Discharge status: Home / Self Care | Payer: Medicare Other | Attending: Emergency Medicine | Admitting: Emergency Medicine

## 2016-05-04 ENCOUNTER — Encounter (HOSPITAL_COMMUNITY): Payer: Self-pay | Admitting: Emergency Medicine

## 2016-05-04 ENCOUNTER — Emergency Department (HOSPITAL_COMMUNITY): Payer: Medicare Other

## 2016-05-04 DIAGNOSIS — I10 Essential (primary) hypertension: Secondary | ICD-10-CM | POA: Diagnosis not present

## 2016-05-04 DIAGNOSIS — H9311 Tinnitus, right ear: Secondary | ICD-10-CM | POA: Insufficient documentation

## 2016-05-04 DIAGNOSIS — Z79899 Other long term (current) drug therapy: Secondary | ICD-10-CM | POA: Insufficient documentation

## 2016-05-04 DIAGNOSIS — Z7952 Long term (current) use of systemic steroids: Secondary | ICD-10-CM | POA: Insufficient documentation

## 2016-05-04 DIAGNOSIS — Z7982 Long term (current) use of aspirin: Secondary | ICD-10-CM | POA: Diagnosis not present

## 2016-05-04 DIAGNOSIS — R51 Headache: Secondary | ICD-10-CM | POA: Insufficient documentation

## 2016-05-04 DIAGNOSIS — L299 Pruritus, unspecified: Secondary | ICD-10-CM | POA: Insufficient documentation

## 2016-05-04 DIAGNOSIS — Z79891 Long term (current) use of opiate analgesic: Secondary | ICD-10-CM | POA: Diagnosis not present

## 2016-05-04 DIAGNOSIS — H9319 Tinnitus, unspecified ear: Secondary | ICD-10-CM | POA: Diagnosis not present

## 2016-05-04 NOTE — ED Notes (Signed)
Pt states that she is restless due to the ringing in her ear and wants it to stop; pt states that she needs blood tests and a MRI to find out what is causing this; pt has been seen for the same complaint in the past; pt states that she has seen her PCP but still continues to have this "sound" in her rt ear

## 2016-05-04 NOTE — ED Notes (Signed)
Pt c/o constant ringing in right ear and itching throughout body x "months." Pt states she is restless.

## 2016-05-04 NOTE — Discharge Instructions (Signed)
Tinnitus  Tinnitus refers to hearing a sound when there is no actual source for that sound. This is often described as ringing in the ears. However, people with this condition may hear a variety of noises. A person may hear the sound in one ear or in both ears.   The sounds of tinnitus can be soft, loud, or somewhere in between. Tinnitus can last for a few seconds or can be constant for days. It may go away without treatment and come back at various times. When tinnitus is constant or happens often, it can lead to other problems, such as trouble sleeping and trouble concentrating.  Almost everyone experiences tinnitus at some point. Tinnitus that is long-lasting (chronic) or comes back often is a problem that may require medical attention.   CAUSES   The cause of tinnitus is often not known. In some cases, it can result from other problems or conditions, including:   · Exposure to loud noises from machinery, music, or other sources.  · Hearing loss.  · Ear or sinus infections.  · Earwax buildup.  · A foreign object in the ear.  · Use of certain medicines.  · Use of alcohol and caffeine.  · High blood pressure.  · Heart diseases.  · Anemia.  · Allergies.  · Meniere disease.  · Thyroid problems.  · Tumors.  · An enlarged part of a weakened blood vessel (aneurysm).  SYMPTOMS  The main symptom of tinnitus is hearing a sound when there is no source for that sound. It may sound like:   · Buzzing.  · Roaring.  · Ringing.  · Blowing air, similar to the sound heard when you listen to a seashell.  · Hissing.  · Whistling.  · Sizzling.  · Humming.  · Running water.  · A sustained musical note.  DIAGNOSIS   Tinnitus is diagnosed based on your symptoms. Your health care provider will do a physical exam. A comprehensive hearing exam (audiologic exam) will be done if your tinnitus:   · Affects only one ear (unilateral).  · Causes hearing difficulties.  · Lasts 6 months or longer.  You may also need to see a health care provider  who specializes in hearing disorders (audiologist). You may be asked to complete a questionnaire to determine the severity of your tinnitus. Tests may be done to help determine the cause and to rule out other conditions. These can include:  · Imaging studies of your head and brain, such as:    A CT scan.    An MRI.  · An imaging study of your blood vessels (angiogram).  TREATMENT   Treating an underlying medical condition can sometimes make tinnitus go away. If your tinnitus continues, other treatments may include:  · Medicines, such as certain antidepressants or sleeping aids.  · Sound generators to mask the tinnitus. These include:  ¨ Tabletop sound machines that play relaxing sounds to help you fall asleep.  ¨ Wearable devices that fit in your ear and play sounds or music.  ¨ A small device that uses headphones to deliver a signal embedded in music (acoustic neural stimulation). In time, this may change the pathways of your brain and make you less sensitive to tinnitus. This device is used for very severe cases when no other treatment is working.  · Therapy and counseling to help you manage the stress of living with tinnitus.  · Using hearing aids or cochlear implants, if your tinnitus is related to hearing   loss.  HOME CARE INSTRUCTIONS  · When possible, avoid being in loud places and being exposed to loud sounds.  · Wear hearing protection, such as earplugs, when you are exposed to loud noises.  · Do not take stimulants, such as nicotine, alcohol, or caffeine.  · Practice techniques for reducing stress, such as meditation, yoga, or deep breathing.  · Use a white noise machine, a humidifier, or other devices to mask the sound of tinnitus.  · Sleep with your head slightly raised. This may reduce the impact of tinnitus.  · Try to get plenty of rest each night.  SEEK MEDICAL CARE IF:  · You have tinnitus in just one ear.  · Your tinnitus continues for 3 weeks or longer without stopping.  · Home care measures are not  helping.  · You have tinnitus after a head injury.  · You have tinnitus along with any of the following:    Dizziness.    Loss of balance.    Nausea and vomiting.     This information is not intended to replace advice given to you by your health care provider. Make sure you discuss any questions you have with your health care provider.     Document Released: 11/14/2005 Document Revised: 12/05/2014 Document Reviewed: 04/16/2014  Elsevier Interactive Patient Education ©2016 Elsevier Inc[employer]

## 2016-05-04 NOTE — ED Provider Notes (Signed)
CSN: [id number]     Arrival date & time 05/04/16  1801 History   First MD Initiated Contact with Patient 05/04/16 1945     Chief Complaint  Patient presents with  . Tinnitus  . Pruritis     (Consider location/radiation/quality/duration/timing/severity/associated sxs/prior Treatment) HPI Comments: Patient here complaining of long-standing history of tinnitus times several years. Has been seen by multiple providers as well as recently here for same. Has been evaluated by ENT without a cause. Denies any ear drainage. Has had mild headache without confusion or vomiting. Is very frustrated that the tinnitus keeps her up at night. Denies any vertiginous symptoms. Denies any weakness in arms or legs. No visual changes. Denies any palpitations.  The history is provided by the patient.    Past Medical History  Diagnosis Date  . Hypertension    Past Surgical History  Procedure Laterality Date  . Fribroid surgery    . Abdominal hysterectomy     History reviewed. No pertinent family history. Social History  Substance Use Topics  . Smoking status: Never Smoker   . Smokeless tobacco: None  . Alcohol Use: No   OB History    No data available     Review of Systems  All other systems reviewed and are negative.     Allergies  Aspirin and Hydroxyzine  Home Medications   Prior to Admission medications   Medication Sig Start Date End Date Taking? Authorizing Provider  aspirin EC 81 MG tablet Take 81 mg by mouth daily.     Yes Historical Provider, MD  Cholecalciferol (VITAMIN D) 2000 units tablet Take 2,000 Units by mouth daily.   Yes Historical Provider, MD  clonazePAM (KLONOPIN) 0.5 MG tablet Take 0.5-1 tablets (0.25-0.5 mg total) by mouth 2 (two) times daily as needed for anxiety (or tinnitus). [id number]  Yes Delora Fuel, MD  hydrOXYzine (ATARAX/VISTARIL) 25 MG tablet Take 1 tablet (25 mg total) by mouth at bedtime. 04/26/16  Yes Tanna Furry, MD  lisinopril (PRINIVIL,ZESTRIL) 10 MG  tablet Take 10 mg by mouth at bedtime.   Yes Historical Provider, MD  Vitamins-Lipotropics (LIPO-FLAVONOID PLUS) TABS Take 1 tablet by mouth daily.   Yes Historical Provider, MD  acetaminophen (TYLENOL) 325 MG tablet Take 325-650 mg by mouth every 6 (six) hours as needed for mild pain, moderate pain or headache.    Historical Provider, MD  diazepam (VALIUM) 2 MG tablet Take 1 tablet (2 mg total) by mouth every 6 (six) hours as needed for anxiety or muscle spasms. Patient not taking: Reported on 05/04/2016 05/17/15   Lacretia Leigh, MD  diclofenac sodium (VOLTAREN) 1 % GEL Apply 1 application topically 4 (four) times daily. Patient not taking: Reported on 05/04/2016 05/12/15   Janne Napoleon, NP  oxyCODONE-acetaminophen (PERCOCET) 7.5-325 MG per tablet Take 1 tablet by mouth every 4 (four) hours as needed for moderate pain or severe pain. Patient not taking: Reported on 05/04/2016 05/17/15   Lacretia Leigh, MD  predniSONE (DELTASONE) 20 MG tablet Take 2 tablets (40 mg total) by mouth daily. Patient not taking: Reported on 05/04/2016 05/17/15   Lacretia Leigh, MD   BP 152/81 mmHg  Pulse 84  Temp(Src) 98.2 F (36.8 C) (Oral)  Resp 16  SpO2 98% Physical Exam  Constitutional: She is oriented to person, place, and time. She appears well-developed and well-nourished.  Non-toxic appearance. No distress.  HENT:  Head: Normocephalic and atraumatic.  Eyes: Conjunctivae, EOM and lids are normal. Pupils are equal, round, and reactive to light.  Neck: Normal range of motion. Neck supple. No tracheal deviation present. No thyroid mass present.  Cardiovascular: Normal rate, regular rhythm and normal heart sounds.  Exam reveals no gallop.   No murmur heard. Pulmonary/Chest: Effort normal and breath sounds normal. No stridor. No respiratory distress. She has no decreased breath sounds. She has no wheezes. She has no rhonchi. She has no rales.  Abdominal: Soft. Normal appearance and bowel sounds are normal. She exhibits no  distension. There is no tenderness. There is no rebound and no CVA tenderness.  Musculoskeletal: Normal range of motion. She exhibits no edema or tenderness.  Neurological: She is alert and oriented to person, place, and time. She has normal strength. No cranial nerve deficit or sensory deficit. GCS eye subscore is 4. GCS verbal subscore is 5. GCS motor subscore is 6.  Skin: Skin is warm and dry. No abrasion and no rash noted.  Psychiatric: She has a normal mood and affect. Her speech is normal and behavior is normal.  Nursing note and vitals reviewed.   ED Course  Procedures (including critical care time) Labs Review Labs Reviewed - No data to display  Imaging Review No results found. I have personally reviewed and evaluated these images and lab results as part of my medical decision-making.   EKG Interpretation None      MDM   Final diagnoses:  None    Head CT is negative and will give patient ENT referral    Lacretia Leigh, MD 05/04/16 2110

## 2016-05-05 ENCOUNTER — Other Ambulatory Visit: Payer: Self-pay | Admitting: Internal Medicine

## 2016-05-05 DIAGNOSIS — E236 Other disorders of pituitary gland: Secondary | ICD-10-CM | POA: Diagnosis not present

## 2016-05-05 DIAGNOSIS — H9313 Tinnitus, bilateral: Secondary | ICD-10-CM | POA: Diagnosis not present

## 2016-05-05 DIAGNOSIS — H9311 Tinnitus, right ear: Secondary | ICD-10-CM

## 2016-05-10 ENCOUNTER — Encounter: Payer: Self-pay | Admitting: *Deleted

## 2016-05-10 ENCOUNTER — Ambulatory Visit (INDEPENDENT_AMBULATORY_CARE_PROVIDER_SITE_OTHER): Payer: Medicare Other | Admitting: Diagnostic Neuroimaging

## 2016-05-10 VITALS — BP 136/78 | HR 70 | Ht 62.0 in | Wt 183.0 lb

## 2016-05-10 DIAGNOSIS — H53123 Transient visual loss, bilateral: Secondary | ICD-10-CM | POA: Diagnosis not present

## 2016-05-10 DIAGNOSIS — H9311 Tinnitus, right ear: Secondary | ICD-10-CM

## 2016-05-10 NOTE — Patient Instructions (Signed)
Thank you for coming to see [place] at [facility]. I hope we have been able to provide you high quality care today.  You may receive a patient satisfaction survey over the next few weeks. We would appreciate your feedback and comments so that we may continue to improve ourselves and the health of our patients.  - Follow up MRI brain scan - Follow up eye exam - Then will check lumbar puncture   ~~~~~~~~~~~~~~~~~~~~~~~~~~~~~~~~~~~~~~~~~~~~~~~~~~~~~~~~~~~~~~~~~  DR. 'S GUIDE TO HAPPY AND HEALTHY LIVING These are some of my general health and wellness recommendations. Some of them may apply to you better than others. Please use common sense as you try these suggestions and feel free to ask me any questions.   ACTIVITY/FITNESS Mental, social, emotional and physical stimulation are very important for brain and body health. Try learning a new activity (arts, music, language, sports, games).  Keep moving your body to the best of your abilities. You can do this at home, inside or outside, the park, community center, gym or anywhere you like. Consider a physical therapist or personal trainer to get started. Consider the app Sworkit. Fitness trackers such as smart-watches, smart-phones or Fitbits can help as well.   NUTRITION Eat more plants: colorful vegetables, nuts, seeds and berries.  Eat less sugar, salt, preservatives and processed foods.  Avoid toxins such as cigarettes and alcohol.  Drink water when you are thirsty. Warm water with a slice of lemon is an excellent morning drink to start the day.  Consider these websites for more information The Nutrition Source (https://www.henry-hernandez.biz/) [employer] Nutrition (WindowBlog.ch)   RELAXATION Consider practicing mindfulness meditation or other relaxation techniques such as deep breathing, prayer, yoga, tai chi, massage. See website mindful.org or the apps Headspace  or Calm to help get started.   SLEEP Try to get at least 7-8+ hours sleep per day. Regular exercise and reduced caffeine will help you sleep better. Practice good sleep hygeine techniques. See website sleep.org for more information.   PLANNING Prepare estate planning, living will, healthcare POA documents. Sometimes this is best planned with the help of an attorney. Theconversationproject.org and agingwithdignity.org are excellent resources.

## 2016-05-10 NOTE — Progress Notes (Signed)
[facility]  PATIENT: Allison Franco DOB: 16-Jan-1941  REFERRING CLINICIAN: Louis Matte HISTORY FROM: patient  REASON FOR VISIT: new consult    HISTORICAL  CHIEF COMPLAINT:  Chief Complaint  Patient presents with  . Empty sella w/tinnitus, possible intracranial HTN    rm 6, New Pt, "ringing all the time in the right ear; I tried to wean off clonopin but I couldn't and now the ringing won't stop"    HISTORY OF PRESENT ILLNESS:   75 year old right-handed female here for evaluation of abnormal sound in right ear. For past 2 years patient has noted wind blowing sound in the right ear. No headaches. Patient having blurred vision. She had 3 episodes of transient visual obscuration lasting 1 second each over the past one week. Patient is planning to have eye exam appointment on June 29 as well as MRI of the brain on June 19.  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 CT scan of the head showed no acute findings. Possibility of idiopathic intracranial hypertension was raised due to presence of empty sella on CT scan. Therefore patient was referred to me for evaluation of possible pseudotumor cerebri.    REVIEW OF SYSTEMS: Full 14 system review of systems performed and negative with exception of: Blurred vision ringing ears constipation not asleep sleepiness.   ALLERGIES: Allergies  Allergen Reactions  . Aspirin Other (See Comments)    Allergic to Adult Aspirin. Cant sleep.   [name] Hydroxyzine Other (See Comments)    insomnia  . Valium [Diazepam] Other (See Comments)    Insomnia, 05/10/16 pt states she took as young person- made her depressed    HOME MEDICATIONS: Outpatient Prescriptions Prior to Visit  Medication Sig Dispense Refill  . acetaminophen (TYLENOL) 325 MG tablet Take 325-650 mg by mouth every 6 (six) hours as needed for mild pain, moderate pain or headache. Reported on 05/10/2016    . aspirin EC 81 MG tablet Take 81 mg by mouth  daily.      . Cholecalciferol (VITAMIN D) 2000 units tablet Take 2,000 Units by mouth daily.    . clonazePAM (KLONOPIN) 0.5 MG tablet Take 0.5-1 tablets (0.25-0.5 mg total) by mouth 2 (two) times daily as needed for anxiety (or tinnitus). 30 tablet 0  . lisinopril (PRINIVIL,ZESTRIL) 10 MG tablet Take 10 mg by mouth at bedtime.    . Vitamins-Lipotropics (LIPO-FLAVONOID PLUS) TABS Take 1 tablet by mouth daily.    . diazepam (VALIUM) 2 MG tablet Take 1 tablet (2 mg total) by mouth every 6 (six) hours as needed for anxiety or muscle spasms. (Patient not taking: Reported on 05/04/2016) 20 tablet 0  . diclofenac sodium (VOLTAREN) 1 % GEL Apply 1 application topically 4 (four) times daily. (Patient not taking: Reported on 05/04/2016) 100 g 0  . hydrOXYzine (ATARAX/VISTARIL) 25 MG tablet Take 1 tablet (25 mg total) by mouth at bedtime. 14 tablet 1  . oxyCODONE-acetaminophen (PERCOCET) 7.5-325 MG per tablet Take 1 tablet by mouth every 4 (four) hours as needed for moderate pain or severe pain. (Patient not taking: Reported on 05/04/2016) 20 tablet 0  . predniSONE (DELTASONE) 20 MG tablet Take 2 tablets (40 mg total) by mouth daily. (Patient not taking: Reported on 05/04/2016) 10 tablet 0   No facility-administered medications prior to visit.    PAST MEDICAL HISTORY: Past Medical History  Diagnosis Date  . Hypertension   . Tinnitus   . Constipation   . Depression     "  following husband's death"    PAST SURGICAL HISTORY: Past Surgical History  Procedure Laterality Date  . Fribroid surgery    . Abdominal hysterectomy      FAMILY HISTORY: Family History  Problem Relation Age of Onset  . Diabetes Mother   . Hypertension Father     SOCIAL HISTORY:  Social History   Social History  . Marital Status: Widowed    Spouse Name: N/A  . Number of Children: 0  . Years of Education: 16   Occupational History  . Not on file.   Social History Main Topics  . Smoking status: Never Smoker   .  Smokeless tobacco: Not on file  . Alcohol Use: No  . Drug Use: No  . Sexual Activity: Not on file   Other Topics Concern  . Not on file   Social History Narrative   Lives alone, husband passed away 2 1/2 years ago   Caffeine use- none     PHYSICAL EXAM  GENERAL EXAM/CONSTITUTIONAL: Vitals:  Filed Vitals:   05/10/16 0953  BP: 136/78  Pulse: 70  Height: 5\' 2"  (1.575 m)  Weight: 183 lb (83.008 kg)     Body mass index is 33.46 kg/(m^2).  Visual Acuity Screening   Right eye Left eye Both eyes  Without correction: 20/30 20/30   With correction:        Patient is in no distress; well developed, nourished and groomed; neck is supple  CARDIOVASCULAR:  Examination of carotid arteries is normal; no carotid bruits  Regular rate and rhythm, no murmurs  Examination of peripheral vascular system by observation and palpation is normal  EYES:  Ophthalmoscopic exam of optic discs and posterior segments --> DIFF TO VISUALIZE DUE TO MIOSIS; no papilledema or hemorrhages  MUSCULOSKELETAL:  Gait, strength, tone, movements noted in Neurologic exam below  NEUROLOGIC: MENTAL STATUS:  No flowsheet data found.  awake, alert, oriented to person, place and time  recent and remote memory intact  normal attention and concentration  language fluent, comprehension intact, naming intact,   fund of knowledge appropriate  CRANIAL NERVE:   2nd - no papilledema on fundoscopic exam; DIFF TO VISUALIZE DUE TO MIOSIS  2nd, 3rd, 4th, 6th - pupils PINPOINT equal and reactive to light, visual fields full to confrontation, extraocular muscles intact, no nystagmus  5th - facial sensation symmetric  7th - facial strength symmetric  8th - hearing intact  9th - palate elevates symmetrically, uvula midline  11th - shoulder shrug symmetric  12th - tongue protrusion midline  MOTOR:   normal bulk and tone, full strength in the BUE, BLE  SENSORY:   normal and symmetric to light  touch, temperature, vibration  COORDINATION:   finger-nose-finger, fine finger movements normal  REFLEXES:   deep tendon reflexes TRACE and symmetric  GAIT/STATION:   narrow based gait    DIAGNOSTIC DATA (LABS, IMAGING, TESTING) - I reviewed patient records, labs, notes, testing and imaging myself where available.  Lab Results  Component Value Date   WBC 7.8 05/17/2015   HGB 12.8 05/17/2015   HCT 40.9 05/17/2015   MCV 88.5 05/17/2015   PLT 266 05/17/2015      Component Value Date/Time   NA 137 05/17/2015 2326   K 4.0 05/17/2015 2326   CL 108 05/17/2015 2326   CO2 25 05/17/2015 2326   GLUCOSE 99 05/17/2015 2326   BUN 20 05/17/2015 2326   CREATININE 0.65 05/17/2015 2326   CALCIUM 8.6* 05/17/2015 2326   PROT  7.1 05/17/2015 2326   ALBUMIN 3.6 05/17/2015 2326   AST 22 05/17/2015 2326   ALT 28 05/17/2015 2326   ALKPHOS 76 05/17/2015 2326   BILITOT 0.9 05/17/2015 2326   GFRNONAA >60 05/17/2015 2326   GFRAA >60 05/17/2015 2326   Lab Results  Component Value Date   CHOL 193 07/24/2008   HDL 51 07/24/2008   LDLCALC 127* 07/24/2008   TRIG 74 07/24/2008   CHOLHDL 3.8 Ratio 07/24/2008   No results found for: HGBA1C No results found for: VITAMINB12 No results found for: TSH   05/11/11 MRI BRAIN [I reviewed images myself and agree with interpretation. Partially empty sella, enlarged optic nerve sheaths also noted. -VRP] 1. No acute or focal abnormality to explain the patient's symptoms. 2. Age advanced subcortical white matter changes bilaterally. The finding is nonspecific but can be seen in the setting of chronic microvascular ischemia, a demyelinating process such as multiple sclerosis, vasculitis, complicated migraine headaches, or as the sequelae of a prior infectious or inflammatory process. 3. Minimal anterior sinus disease.     ASSESSMENT AND PLAN  75 y.o. year old female here with unexplained onset of right-sided tinnitus, several episodes of transient  visual obscuration, here for evaluation of pseudotumor cerebri.  Ddx: idiopathic intracranial hypertension, medication side effect, senile tinnitus, other secondary cause  1. Right-sided tinnitus   2. Transient visual loss of both eyes     PLAN: - check MRI brain (scheduled for June 19) - check eye doctor exam - then check lumbar puncture to measure opening pressure  Return in about 2 months (around 07/10/2016).  I reviewed images, labs, notes, records myself. I summarized findings and reviewed with patient, for this high risk condition (right sided tinnitus, transient visual obscuration) requiring high complexity decision making.    Penni Bombard, MD [id number], [id number] AM Certified in Neurology, Neurophysiology and Neuroimaging  [facility] [address], [place], [place] [address] [phone]

## 2016-05-16 ENCOUNTER — Ambulatory Visit
Admission: RE | Admit: 2016-05-16 | Discharge: 2016-05-16 | Disposition: A | Discharge status: Home / Self Care | Payer: Medicare Other | Source: Ambulatory Visit | Attending: Internal Medicine | Admitting: Internal Medicine

## 2016-05-16 DIAGNOSIS — H9311 Tinnitus, right ear: Secondary | ICD-10-CM

## 2016-05-16 MED ORDER — GADOBENATE DIMEGLUMINE 529 MG/ML IV SOLN
17.0000 mL | Freq: Once | INTRAVENOUS | Status: AC | PRN
  Administered 2016-05-16: 17 mL via INTRAVENOUS

## 2016-07-01 ENCOUNTER — Encounter (HOSPITAL_COMMUNITY): Payer: Self-pay | Admitting: Emergency Medicine

## 2016-07-01 ENCOUNTER — Emergency Department (HOSPITAL_COMMUNITY)
Admission: EM | Admit: 2016-07-01 | Discharge: 2016-07-02 | Disposition: A | Discharge status: Home / Self Care | Payer: Medicare Other | Attending: Emergency Medicine | Admitting: Emergency Medicine

## 2016-07-01 DIAGNOSIS — H9311 Tinnitus, right ear: Secondary | ICD-10-CM | POA: Diagnosis not present

## 2016-07-01 DIAGNOSIS — I10 Essential (primary) hypertension: Secondary | ICD-10-CM | POA: Insufficient documentation

## 2016-07-01 DIAGNOSIS — Z7982 Long term (current) use of aspirin: Secondary | ICD-10-CM | POA: Diagnosis not present

## 2016-07-01 MED ORDER — ALPRAZOLAM 0.25 MG PO TABS: 0.2500 mg | ORAL_TABLET | Freq: Every evening | ORAL | 0 refills | Status: DC | PRN

## 2016-07-01 NOTE — ED Triage Notes (Signed)
Pt from home with complaints of ringing in her right ear. Pt denies pain or drainage in that ear. Pt denies dizziness. Pt states this has been going on for a year. Pt states she has had imaging done for this. Pt states they have never given her clear written discharge instructions about this. Pt states she comes to the hospital every time the ringing becomes unbearable

## 2016-07-01 NOTE — ED Provider Notes (Signed)
[facility] DEPT Provider Note   CSN: [id number] Arrival date & time: 07/01/16  2125  By signing my name below, I, Gwenlyn Fudge, attest that this documentation has been prepared under the direction and in the presence of Orpah Greek, MD. Electronically Signed: Gwenlyn Fudge, ED Scribe. 07/01/16. 11:50 PM.   First MD Initiated Contact with Patient 07/01/16 2324     History   Chief Complaint Chief Complaint  Patient presents with  . Tinnitus    right ear    The history is provided by the patient. No language interpreter was used.    HPI Comments: Allison Franco is a 75 y.o. female who presents to the Emergency Department complaining of gradual onset, constant, right sided tinnitus onset yesterday. Pt states she has had similar symptoms before, but that this one is different because it has not stopped. She states she has had trouble sleeping due to the ringing. She states she has been seen 4x in the ED before for similar symptoms.   Past Medical History:  Diagnosis Date  . Constipation   . Depression    "following husband's death"  . Hypertension   . Tinnitus     Patient Active Problem List   Diagnosis Date Noted  . Grief reaction 10/20/2013  . Hyponatremia 10/20/2013  . Obesity 10/20/2013  . Atypical chest pain 09/01/2013  . ALLERGIC RHINITIS 08/11/2009  . KNEE PAIN, BILATERAL 02/13/2009  . COUGH 02/13/2009  . INSOMNIA 07/17/2008  . TIREDNESS 04/16/2008  . HYPERTENSION 01/24/2008  . BACK PAIN, LUMBAR 01/22/2008  . HYPERGLYCEMIA, BORDERLINE 01/27/2007    Past Surgical History:  Procedure Laterality Date  . ABDOMINAL HYSTERECTOMY    . fribroid surgery      OB History    No data available       Home Medications    Prior to Admission medications   Medication Sig Start Date End Date Taking? Authorizing Provider  acetaminophen (TYLENOL) 325 MG tablet Take 325-650 mg by mouth every 6 (six) hours as needed for mild pain, moderate pain or headache.  Reported on 05/10/2016    Historical Provider, MD  ALPRAZolam Duanne Moron) 0.25 MG tablet Take 1 tablet (0.25 mg total) by mouth at bedtime as needed for anxiety or sleep. 07/01/16   Orpah Greek, MD  aspirin EC 81 MG tablet Take 81 mg by mouth daily.      Historical Provider, MD  Cholecalciferol (VITAMIN D) 2000 units tablet Take 2,000 Units by mouth daily.    Historical Provider, MD  clonazePAM (KLONOPIN) 0.5 MG tablet Take 0.5-1 tablets (0.25-0.5 mg total) by mouth 2 (two) times daily as needed for anxiety (or tinnitus). [id number]   Delora Fuel, MD  lisinopril (PRINIVIL,ZESTRIL) 10 MG tablet Take 10 mg by mouth at bedtime.    Historical Provider, MD  Vitamins-Lipotropics (LIPO-FLAVONOID PLUS) TABS Take 1 tablet by mouth daily.    Historical Provider, MD    Family History Family History  Problem Relation Age of Onset  . Diabetes Mother   . Hypertension Father     Social History Social History  Substance Use Topics  . Smoking status: Never Smoker  . Smokeless tobacco: Never Used  . Alcohol use No     Allergies   Aspirin; Hydroxyzine; and Valium [diazepam]   Review of Systems Review of Systems  Constitutional: Negative for fever.  HENT: Positive for tinnitus.   Psychiatric/Behavioral: Positive for sleep disturbance.  All other systems reviewed and are negative.    Physical Exam Updated Vital  Signs BP 139/74 (BP Location: Left Arm)   Pulse 86   Temp 97.8 F (36.6 C) (Oral)   Resp 18   Ht 5\' 3"  (1.6 m)   Wt 180 lb (81.6 kg)   SpO2 99%   BMI 31.89 kg/m   Physical Exam   ED Treatments / Results  DIAGNOSTIC STUDIES: Oxygen Saturation is 99% on RA, normal by my interpretation.    COORDINATION OF CARE: 11:25 PM Discussed treatment plan with pt at bedside which includes Xanax and pt agreed to plan.  Labs (all labs ordered are listed, but only abnormal results are displayed) Labs Reviewed - No data to display  EKG  EKG Interpretation None        Radiology No results found.  Procedures Procedures (including critical care time)  Medications Ordered in ED Medications - No data to display   Initial Impression / Assessment and Plan / ED Course  I have reviewed the triage vital signs and the nursing notes.  Pertinent labs & imaging results that were available during my care of the patient were reviewed by me and considered in my medical decision making (see chart for details).  Clinical Course   Patient presents to the emergency department for evaluation of tinnitus of the right ear. This is a chronic problem for the patient. Patient has been seen in this ER many times for this. Additionally she has been referred to ENT and has seen neurology for this problem. Patient reports that she became very agitated tonight because it has been going on since yesterday and she has been unable to sleep. Neurologic exam is unremarkable today. She has had 2 MRIs and CT scans of her head in the last several months. She does not require any workup tonight.  Final Clinical Impressions(s) / ED Diagnoses   Final diagnoses:  Tinnitus, right    New Prescriptions New Prescriptions   ALPRAZOLAM (XANAX) 0.25 MG TABLET    Take 1 tablet (0.25 mg total) by mouth at bedtime as needed for anxiety or sleep.   I personally performed the services described in this documentation, which was scribed in my presence. The recorded information has been reviewed and is accurate.     Orpah Greek, MD 07/01/16 [phone]

## 2016-07-07 ENCOUNTER — Telehealth: Payer: Self-pay | Admitting: Diagnostic Neuroimaging

## 2016-07-07 NOTE — Telephone Encounter (Signed)
Pt called said the ringing in the ears is keeping her awake and she is restless. Pt is very tearful. She had an appt on 8/15 which I r/s to 8/14. pls call

## 2016-07-07 NOTE — Telephone Encounter (Signed)
Spoke with patient who stated she is "very restless, not sleeping". She became tearful for a few minutes. Inquired if she ever saw the eye dr after seeing Dr Leta Baptist in June. She stated the office called her, made an appointment but when she got to the office, the eye dr did not see her. She does not remember the dr's name. She stated she was not rescheduled. She stated she has not taken the Xanax the ED dr gave her. She wanted to see Dr Leta Baptist first.  She is requesting to be seen tomorrow, advised her this RN will discuss with Dr Leta Baptist can call her back today. She verbalized understanding, appreciation.

## 2016-07-07 NOTE — Telephone Encounter (Signed)
Spoke with patient and informed her that Dr Leta Baptist will see her Monday. Advised she try the medication the ED dr prescribed to help her sleep. She stated she was told not to take both the clonopin and xanax. Advised she follow the ED dr's advise, and inquired if she has been taking clonopin at night. She stated she has; so advised she think about taking the xanax instead of the clonopin. She stated she would try; this RN confirmed her appointment time on Monday, advised she arrive 15 min early. She verbalized understanding.  Addendum: patient denied pain in earlier phone conversation.

## 2016-07-07 NOTE — Telephone Encounter (Signed)
I will see patient on Monday. -VRP

## 2016-07-09 DIAGNOSIS — Z23 Encounter for immunization: Secondary | ICD-10-CM | POA: Diagnosis not present

## 2016-07-11 ENCOUNTER — Ambulatory Visit: Payer: Medicare Other | Admitting: Diagnostic Neuroimaging

## 2016-07-12 ENCOUNTER — Encounter: Payer: Self-pay | Admitting: Diagnostic Neuroimaging

## 2016-07-12 ENCOUNTER — Ambulatory Visit: Payer: Medicare Other | Admitting: Diagnostic Neuroimaging

## 2016-07-12 ENCOUNTER — Ambulatory Visit (INDEPENDENT_AMBULATORY_CARE_PROVIDER_SITE_OTHER): Payer: Medicare Other | Admitting: Diagnostic Neuroimaging

## 2016-07-12 VITALS — BP 150/85 | HR 80 | Wt 179.8 lb

## 2016-07-12 DIAGNOSIS — H9311 Tinnitus, right ear: Secondary | ICD-10-CM

## 2016-07-12 DIAGNOSIS — G4700 Insomnia, unspecified: Secondary | ICD-10-CM | POA: Diagnosis not present

## 2016-07-12 DIAGNOSIS — F329 Major depressive disorder, single episode, unspecified: Secondary | ICD-10-CM

## 2016-07-12 DIAGNOSIS — F411 Generalized anxiety disorder: Secondary | ICD-10-CM | POA: Diagnosis not present

## 2016-07-12 DIAGNOSIS — H53123 Transient visual loss, bilateral: Secondary | ICD-10-CM

## 2016-07-12 DIAGNOSIS — F32A Depression, unspecified: Secondary | ICD-10-CM

## 2016-07-12 NOTE — Patient Instructions (Addendum)
-   check eye doctor exam - check lumbar puncture to measure opening pressure - referral to [facility] tinnitus clinic - referral to psychiatry - use melatonin supplement to help with sleep

## 2016-07-12 NOTE — Progress Notes (Signed)
[facility]  PATIENT: Allison Franco DOB: 12/13/1940  REFERRING CLINICIAN: Louis Matte HISTORY FROM: patient  REASON FOR VISIT: follow up    HISTORICAL  CHIEF COMPLAINT:  Chief Complaint  Patient presents with  . Other    rm 7, right sided tinnitus, "the noise is bothering me"  . Follow-up    requested early FU    HISTORY OF PRESENT ILLNESS:   UPDATE 07/12/16: Since last visit, continues with right sided tinnitus. MRI brain reviewed. Having more depression and anxiety. No more transient visual obscuration. Patient tells me that onset of these symptoms was immediately after husband unexpectedly passed away in 03-Sep-2013.  PRIOR HPI (05/10/16): 75 year old right-handed female here for evaluation of abnormal sound in right ear. For past 2 years patient has noted wind blowing sound in the right ear. No headaches. Patient having blurred vision. She had 3 episodes of transient visual obscuration lasting 1 second each over the past one week. Patient is planning to have eye exam appointment on June 29 as well as MRI of the brain on June 19. 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 CT scan of the head showed no acute findings. Possibility of idiopathic intracranial hypertension was raised due to presence of empty sella on CT scan. Therefore patient was referred to me for evaluation of possible pseudotumor cerebri.    REVIEW OF SYSTEMS: Full 14 system review of systems performed and negative with exception of: ringing ears constipation not asleep sleepiness depression constipation.   ALLERGIES: Allergies  Allergen Reactions  . Aspirin Other (See Comments)    Allergic to Adult Aspirin. Cant sleep.   [name] Hydroxyzine Other (See Comments)    insomnia  . Valium [Diazepam] Other (See Comments)    Insomnia, 05/10/16 pt states she took as young person- made her depressed    HOME MEDICATIONS: Outpatient Medications Prior to Visit  Medication Sig Dispense  Refill  . Cholecalciferol (VITAMIN D) 2000 units tablet Take 2,000 Units by mouth daily.    . clonazePAM (KLONOPIN) 0.5 MG tablet Take 0.5-1 tablets (0.25-0.5 mg total) by mouth 2 (two) times daily as needed for anxiety (or tinnitus). 30 tablet 0  . lisinopril (PRINIVIL,ZESTRIL) 10 MG tablet Take 10 mg by mouth at bedtime.    [name] acetaminophen (TYLENOL) 325 MG tablet Take 325-650 mg by mouth every 6 (six) hours as needed for mild pain, moderate pain or headache. Reported on 05/10/2016    . ALPRAZolam (XANAX) 0.25 MG tablet Take 1 tablet (0.25 mg total) by mouth at bedtime as needed for anxiety or sleep. (Patient not taking: Reported on 07/12/2016) 5 tablet 0  . aspirin EC 81 MG tablet Take 81 mg by mouth daily.      . Vitamins-Lipotropics (LIPO-FLAVONOID PLUS) TABS Take 1 tablet by mouth daily.     No facility-administered medications prior to visit.     PAST MEDICAL HISTORY: Past Medical History:  Diagnosis Date  . Constipation   . Depression    "following husband's death"  . Hypertension   . Tinnitus     PAST SURGICAL HISTORY: Past Surgical History:  Procedure Laterality Date  . ABDOMINAL HYSTERECTOMY    . fribroid surgery      FAMILY HISTORY: Family History  Problem Relation Age of Onset  . Diabetes Mother   . Hypertension Father     SOCIAL HISTORY:  Social History   Social History  . Marital status: Widowed    Spouse name: N/A  .  Number of children: 0  . Years of education: 70   Occupational History  . Not on file.   Social History Main Topics  . Smoking status: Never Smoker  . Smokeless tobacco: Never Used  . Alcohol use No  . Drug use: No  . Sexual activity: Not on file   Other Topics Concern  . Not on file   Social History Narrative   Lives alone, husband passed away 2 1/2 years ago   Caffeine use- none     PHYSICAL EXAM  GENERAL EXAM/CONSTITUTIONAL: Vitals:  Vitals:   07/12/16 1350  BP: (!) 157/98  Pulse: 78  Weight: 179 lb 12.8 oz (81.6  kg)   Body mass index is 31.85 kg/m. No exam data present  Patient is in no distress; well developed, nourished and groomed; neck is supple  CARDIOVASCULAR:  Examination of carotid arteries is normal; no carotid bruits  Regular rate and rhythm, no murmurs  Examination of peripheral vascular system by observation and palpation is normal  EYES:  Ophthalmoscopic exam of optic discs and posterior segments --> DIFF TO VISUALIZE DUE TO MIOSIS; no papilledema or hemorrhages  MUSCULOSKELETAL:  Gait, strength, tone, movements noted in Neurologic exam below  NEUROLOGIC: MENTAL STATUS:  No flowsheet data found.  awake, alert, oriented to person, place and time  recent and remote memory intact  normal attention and concentration  language fluent, comprehension intact, naming intact,   fund of knowledge appropriate  CRANIAL NERVE:   2nd - no papilledema on fundoscopic exam; DIFF TO VISUALIZE DUE TO MIOSIS  2nd, 3rd, 4th, 6th - pupils PINPOINT equal and reactive to light, visual fields full to confrontation, extraocular muscles intact, no nystagmus  5th - facial sensation symmetric  7th - facial strength symmetric  8th - hearing intact  9th - palate elevates symmetrically, uvula midline  11th - shoulder shrug symmetric  12th - tongue protrusion midline  MOTOR:   normal bulk and tone, full strength in the BUE, BLE  SENSORY:   normal and symmetric to light touch, temperature, vibration  COORDINATION:   finger-nose-finger, fine finger movements normal  REFLEXES:   deep tendon reflexes TRACE and symmetric  GAIT/STATION:   narrow based gait    DIAGNOSTIC DATA (LABS, IMAGING, TESTING) - I reviewed patient records, labs, notes, testing and imaging myself where available.  Lab Results  Component Value Date   WBC 7.8 05/17/2015   HGB 12.8 05/17/2015   HCT 40.9 05/17/2015   MCV 88.5 05/17/2015   PLT 266 05/17/2015      Component Value Date/Time   NA  137 05/17/2015 2326   K 4.0 05/17/2015 2326   CL 108 05/17/2015 2326   CO2 25 05/17/2015 2326   GLUCOSE 99 05/17/2015 2326   BUN 20 05/17/2015 2326   CREATININE 0.65 05/17/2015 2326   CALCIUM 8.6 (L) 05/17/2015 2326   PROT 7.1 05/17/2015 2326   ALBUMIN 3.6 05/17/2015 2326   AST 22 05/17/2015 2326   ALT 28 05/17/2015 2326   ALKPHOS 76 05/17/2015 2326   BILITOT 0.9 05/17/2015 2326   GFRNONAA >60 05/17/2015 2326   GFRAA >60 05/17/2015 2326   Lab Results  Component Value Date   CHOL 193 07/24/2008   HDL 51 07/24/2008   LDLCALC 127 (H) 07/24/2008   TRIG 74 07/24/2008   CHOLHDL 3.8 Ratio 07/24/2008   No results found for: HGBA1C No results found for: VITAMINB12 No results found for: TSH   05/11/11 MRI BRAIN [I reviewed images myself  and agree with interpretation. Partially empty sella, enlarged optic nerve sheaths also noted. -VRP] 1. No acute or focal abnormality to explain the patient's symptoms. 2. Age advanced subcortical white matter changes bilaterally. The finding is nonspecific but can be seen in the setting of chronic microvascular ischemia, a demyelinating process such as multiple sclerosis, vasculitis, complicated migraine headaches, or as the sequelae of a prior infectious or inflammatory process. 3. Minimal anterior sinus disease.  05/16/16 MRI brain - Labyrinthine structures are symmetric and normal bilaterally. No evidence of internal auditory canal enhancing lesion. - No acute infarct or intracranial hemorrhage. - Moderate chronic microvascular disease. - Series 7 image 13 with slight confluent altered signal intensity of the medial aspect of the posterior left temporal-occipital junction. This may also reflect changes of chronic microvascular changes however, given the slightly confluent appearance, follow-up MR in 6 months recommended to exclude the less likely consideration of this representing a primary brain lesion.      ASSESSMENT AND PLAN  75 y.o.  year old female here with unexplained onset of right-sided tinnitus 09/01/13 after husband passed away from 7 year battle with cancer), several episodes of transient visual obscuration, here for evaluation of pseudotumor cerebri.  Ddx: idiopathic intracranial hypertension vs senile tinnitus vs conversion reaction (husband passing away, depression, anxiety, insomnia)  1. Right-sided tinnitus   2. Transient visual loss of both eyes   3. Depression   4. Anxiety state   5. Insomnia      PLAN: - check eye doctor exam - check lumbar puncture to measure opening pressure - referral to [facility] tinnitus clinic - referral to psychiatry  Orders Placed This Encounter  Procedures  . DG FLUORO GUIDED LOC OF NEEDLE/CATH TIP FOR SPINAL INJECT LT  . Ambulatory referral to Audiology  . Ambulatory referral to Psychiatry   Return in about 3 months (around 10/12/2016).    Penni Bombard, MD [id number], [id number] PM Certified in Neurology, Neurophysiology and Neuroimaging  [facility] [address], [place], [place] [phone]

## 2016-07-13 DIAGNOSIS — Z1231 Encounter for screening mammogram for malignant neoplasm of breast: Secondary | ICD-10-CM | POA: Diagnosis not present

## 2016-07-13 DIAGNOSIS — Z79899 Other long term (current) drug therapy: Secondary | ICD-10-CM | POA: Diagnosis not present

## 2016-07-13 DIAGNOSIS — E785 Hyperlipidemia, unspecified: Secondary | ICD-10-CM | POA: Diagnosis not present

## 2016-07-13 DIAGNOSIS — I10 Essential (primary) hypertension: Secondary | ICD-10-CM | POA: Diagnosis not present

## 2016-07-13 DIAGNOSIS — Z1211 Encounter for screening for malignant neoplasm of colon: Secondary | ICD-10-CM | POA: Diagnosis not present

## 2016-07-13 DIAGNOSIS — Z0000 Encounter for general adult medical examination without abnormal findings: Secondary | ICD-10-CM | POA: Diagnosis not present

## 2016-07-22 ENCOUNTER — Inpatient Hospital Stay
Admission: RE | Admit: 2016-07-22 | Discharge: 2016-07-22 | Disposition: A | Discharge status: Home / Self Care | Payer: Medicare Other | Source: Ambulatory Visit | Attending: Diagnostic Neuroimaging | Admitting: Diagnostic Neuroimaging

## 2016-07-22 NOTE — Discharge Instructions (Signed)

## 2016-08-11 DIAGNOSIS — H9311 Tinnitus, right ear: Secondary | ICD-10-CM | POA: Diagnosis not present

## 2016-08-17 ENCOUNTER — Ambulatory Visit (HOSPITAL_COMMUNITY)
Admission: EM | Admit: 2016-08-17 | Discharge: 2016-08-17 | Disposition: A | Discharge status: Home / Self Care | Payer: Medicare Other | Attending: Nurse Practitioner | Admitting: Nurse Practitioner

## 2016-08-17 ENCOUNTER — Encounter (HOSPITAL_COMMUNITY): Payer: Self-pay | Admitting: *Deleted

## 2016-08-17 DIAGNOSIS — B9789 Other viral agents as the cause of diseases classified elsewhere: Principal | ICD-10-CM

## 2016-08-17 DIAGNOSIS — J069 Acute upper respiratory infection, unspecified: Secondary | ICD-10-CM | POA: Diagnosis not present

## 2016-08-17 MED ORDER — HYDROCOD POLST-CPM POLST ER 10-8 MG/5ML PO SUER: 5.0000 mL | Freq: Two times a day (BID) | ORAL | 0 refills | Status: AC | PRN

## 2016-08-17 NOTE — ED Triage Notes (Signed)
Pt reports   Symptoms  Of  Cough  Sneezing       Congested      With  Onset  Of  Symptoms  Beginning  Last pm         Pt reports   She  Has  Been taking  Tylenol  Since  Last  Pm     She  Has  Allergies   To  Aspirin     She  Is  Also  A  Diabetic   Who

## 2016-08-17 NOTE — ED Provider Notes (Signed)
CSN: [id number]     Arrival date & time 08/17/16  1013 History   First MD Initiated Contact with Patient 08/17/16 1151     Chief Complaint  Patient presents with  . Cough   (Consider location/radiation/quality/duration/timing/severity/associated sxs/prior Treatment)  Cough  Cough characteristics:  Productive Sputum characteristics:  Unable to specify Severity:  Moderate Onset quality:  Sudden Duration:  2 days Timing:  Constant Progression:  Unchanged Chronicity:  New Smoker: no   Context: not animal exposure, not fumes, not sick contacts and not smoke exposure   Relieved by:  None tried Ineffective treatments:  None tried Associated symptoms: rhinorrhea   Associated symptoms: no chest pain, no chills, no ear fullness, no ear pain, no eye discharge, no fever, no headaches, no myalgias, no shortness of breath and no sore throat     Past Medical History:  Diagnosis Date  . Constipation   . Depression    "following husband's death"  . Hypertension   . Tinnitus    Past Surgical History:  Procedure Laterality Date  . ABDOMINAL HYSTERECTOMY    . fribroid surgery     Family History  Problem Relation Age of Onset  . Diabetes Mother   . Hypertension Father    Social History  Substance Use Topics  . Smoking status: Never Smoker  . Smokeless tobacco: Never Used  . Alcohol use No   OB History    No data available     Review of Systems  Constitutional: Negative for chills, fatigue and fever.  HENT: Positive for rhinorrhea and sneezing. Negative for congestion, ear pain and sore throat.        + irritated throat, +mild frontal sinus pressure  Eyes: Negative for discharge, redness, itching and visual disturbance.  Respiratory: Positive for cough. Negative for shortness of breath.   Cardiovascular: Negative for chest pain, palpitations and leg swelling.  Gastrointestinal: Negative for abdominal pain, diarrhea, nausea and vomiting.  Musculoskeletal: Negative for myalgias.   Neurological: Negative for dizziness, weakness and headaches.    Allergies  Aspirin; Hydroxyzine; and Valium [diazepam]  Home Medications   Prior to Admission medications   Medication Sig Start Date End Date Taking? Authorizing Provider  acetaminophen (TYLENOL) 325 MG tablet Take 325-650 mg by mouth every 6 (six) hours as needed for mild pain, moderate pain or headache. Reported on 05/10/2016    Historical Provider, MD  ALPRAZolam Duanne Moron) 0.25 MG tablet Take 1 tablet (0.25 mg total) by mouth at bedtime as needed for anxiety or sleep. Patient not taking: Reported on 07/12/2016 07/01/16   Orpah Greek, MD  aspirin EC 81 MG tablet Take 81 mg by mouth daily.      Historical Provider, MD  chlorpheniramine-HYDROcodone (TUSSIONEX PENNKINETIC ER) 10-8 MG/5ML SUER Take 5 mLs by mouth every 12 (twelve) hours as needed for cough. 08/17/16 08/22/16  Barry Dienes, NP  Cholecalciferol (VITAMIN D) 2000 units tablet Take 2,000 Units by mouth daily.    Historical Provider, MD  clonazePAM (KLONOPIN) 0.5 MG tablet Take 0.5-1 tablets (0.25-0.5 mg total) by mouth 2 (two) times daily as needed for anxiety (or tinnitus). 123XX123   Delora Fuel, MD  lisinopril (PRINIVIL,ZESTRIL) 10 MG tablet Take 10 mg by mouth at bedtime.    Historical Provider, MD  Vitamins-Lipotropics (LIPO-FLAVONOID PLUS) TABS Take 1 tablet by mouth daily.    Historical Provider, MD   Meds Ordered and Administered this Visit  Medications - No data to display  BP 140/72 (BP Location: Right Arm)  Pulse 78   Temp 98.6 F (37 C)   Resp 18   SpO2 100%  No data found.   Physical Exam  Constitutional: She is oriented to person, place, and time. She appears well-developed and well-nourished.  HENT:  Head: Normocephalic and atraumatic.  Right Ear: External ear normal.  Left Ear: External ear normal.  Nose: Nose normal.  Mouth/Throat: Oropharynx is clear and moist. No oropharyngeal exudate.  TM normal bilaterally  Eyes: Conjunctivae  and EOM are normal. Pupils are equal, round, and reactive to light. Right eye exhibits no discharge. Left eye exhibits no discharge.  Neck: Normal range of motion. Neck supple.  Cardiovascular: Normal rate, regular rhythm and normal heart sounds.   No murmur heard. Pulmonary/Chest: Effort normal and breath sounds normal. No respiratory distress. She has no wheezes.  Abdominal: Soft. Bowel sounds are normal. She exhibits no distension. There is no tenderness.  Neurological: She is alert and oriented to person, place, and time.  Skin: Skin is warm and dry.  Psychiatric: She has a normal mood and affect.  Nursing note and vitals reviewed.   Urgent Care Course   Clinical Course    Procedures (including critical care time)  Labs Review Labs Reviewed - No data to display  Imaging Review No results found.    MDM   1. Viral URI with cough    Physical examination normal today. Patient safe to be discharge home. Prescriptions given (see above). Reviewed directions for usage and side effects. Patient states understanding and will call with questions or problems. Patient instructed to call or follow up with his/her primary care doctor if failure to improve or change in symptoms. Discharge instruction given.     Barry Dienes, NP 08/17/16 1204

## 2016-08-17 NOTE — ED Notes (Signed)
Plan  Of  Care  Discussed   With  Pt      Pt  Advised  That the  Cough  Med  May make  Her drowsy

## 2016-08-17 NOTE — Discharge Instructions (Addendum)
Please follow up with your regular doctor if your coughing does not improve.

## 2016-09-14 ENCOUNTER — Encounter (HOSPITAL_COMMUNITY): Payer: Self-pay

## 2016-09-14 ENCOUNTER — Emergency Department (HOSPITAL_COMMUNITY)
Admission: EM | Admit: 2016-09-14 | Discharge: 2016-09-14 | Disposition: A | Discharge status: Home / Self Care | Payer: Medicare Other | Attending: Emergency Medicine | Admitting: Emergency Medicine

## 2016-09-14 DIAGNOSIS — R319 Hematuria, unspecified: Secondary | ICD-10-CM | POA: Diagnosis not present

## 2016-09-14 DIAGNOSIS — Z79899 Other long term (current) drug therapy: Secondary | ICD-10-CM | POA: Diagnosis not present

## 2016-09-14 DIAGNOSIS — Z7982 Long term (current) use of aspirin: Secondary | ICD-10-CM | POA: Diagnosis not present

## 2016-09-14 DIAGNOSIS — I10 Essential (primary) hypertension: Secondary | ICD-10-CM | POA: Diagnosis not present

## 2016-09-14 DIAGNOSIS — R350 Frequency of micturition: Secondary | ICD-10-CM | POA: Diagnosis not present

## 2016-09-14 DIAGNOSIS — R300 Dysuria: Secondary | ICD-10-CM | POA: Diagnosis not present

## 2016-09-14 LAB — URINALYSIS, ROUTINE W REFLEX MICROSCOPIC
Bilirubin Urine: NEGATIVE
Glucose, UA: NEGATIVE mg/dL
KETONES UR: NEGATIVE mg/dL
Nitrite: NEGATIVE
PH: 6 (ref 5.0–8.0)
Protein, ur: 30 mg/dL — AB
SPECIFIC GRAVITY, URINE: 1.007 (ref 1.005–1.030)

## 2016-09-14 LAB — BASIC METABOLIC PANEL
ANION GAP: 5 (ref 5–15)
BUN: 18 mg/dL (ref 6–20)
CALCIUM: 9.4 mg/dL (ref 8.9–10.3)
CO2: 28 mmol/L (ref 22–32)
Chloride: 101 mmol/L (ref 101–111)
Creatinine, Ser: 0.8 mg/dL (ref 0.44–1.00)
GFR calc Af Amer: >60 mL/min (ref >60)
GFR calc non Af Amer: >60 mL/min (ref >60)
GLUCOSE: 108 mg/dL — AB (ref 65–99)
Potassium: 4.7 mmol/L (ref 3.5–5.1)
Sodium: 134 mmol/L — ABNORMAL LOW (ref 135–145)

## 2016-09-14 LAB — CBC WITH DIFFERENTIAL/PLATELET
BASOS ABS: 0 10*3/uL (ref 0.0–0.1)
Basophils Relative: 0 %
Eosinophils Absolute: 0.1 10*3/uL (ref 0.0–0.7)
Eosinophils Relative: 1 %
HEMATOCRIT: 41.6 % (ref 36.0–46.0)
Hemoglobin: 13.3 g/dL (ref 12.0–15.0)
LYMPHS PCT: 13 %
Lymphs Abs: 1.4 10*3/uL (ref 0.7–4.0)
MCH: 27.8 pg (ref 26.0–34.0)
MCHC: 32 g/dL (ref 30.0–36.0)
MCV: 86.8 fL (ref 78.0–100.0)
MONO ABS: 0.6 10*3/uL (ref 0.1–1.0)
MONOS PCT: 5 %
NEUTROS ABS: 8.8 10*3/uL — AB (ref 1.7–7.7)
Neutrophils Relative %: 81 %
Platelets: 239 10*3/uL (ref 150–400)
RBC: 4.79 MIL/uL (ref 3.87–5.11)
RDW: 12.7 % (ref 11.5–15.5)
WBC: 10.8 10*3/uL — ABNORMAL HIGH (ref 4.0–10.5)

## 2016-09-14 LAB — URINE MICROSCOPIC-ADD ON

## 2016-09-14 MED ORDER — CEPHALEXIN 500 MG PO CAPS: 500.0000 mg | ORAL_CAPSULE | Freq: Two times a day (BID) | ORAL | 0 refills | Status: DC

## 2016-09-14 MED ORDER — CEPHALEXIN 500 MG PO CAPS
500.0000 mg | ORAL_CAPSULE | Freq: Once | ORAL | Status: AC
Start: 2016-09-14 — End: 2016-09-14
  Administered 2016-09-14: 500 mg via ORAL
  Filled 2016-09-14: qty 1

## 2016-09-14 NOTE — ED Provider Notes (Signed)
[facility] DEPT Provider Note   CSN: [id number] Arrival date & time: 09/14/16  0554     History   Chief Complaint Chief Complaint  Patient presents with  . Hematuria    HPI Allison Franco is a 75 y.o. female.  Patient presents to the ED with a chief complaint of dysuria.  She states that she noticed having dysuria yesterday.  She reports associated hematuria and urinary frequency and urgency.  She denies any fevers, chills, abdominal pain, or vomiting.  She feels pain in her pelvis when urinating only.  There are no modifying factors.  She has not taken anything to alleviate her symptoms.  She attributes the symptoms to having eaten a kiwi yesterday.  Denies any vaginal discharge.     The history is provided by the patient. No language interpreter was used.    Past Medical History:  Diagnosis Date  . Constipation   . Depression    "following husband's death"  . Hypertension   . Tinnitus     Patient Active Problem List   Diagnosis Date Noted  . Grief reaction 10/20/2013  . Hyponatremia 10/20/2013  . Obesity 10/20/2013  . Atypical chest pain 09/01/2013  . ALLERGIC RHINITIS 08/11/2009  . KNEE PAIN, BILATERAL 02/13/2009  . COUGH 02/13/2009  . INSOMNIA 07/17/2008  . TIREDNESS 04/16/2008  . HYPERTENSION 01/24/2008  . BACK PAIN, LUMBAR 01/22/2008  . HYPERGLYCEMIA, BORDERLINE 01/27/2007    Past Surgical History:  Procedure Laterality Date  . ABDOMINAL HYSTERECTOMY    . fribroid surgery      OB History    No data available       Home Medications    Prior to Admission medications   Medication Sig Start Date End Date Taking? Authorizing Provider  acetaminophen (TYLENOL) 325 MG tablet Take 325-650 mg by mouth every 6 (six) hours as needed for mild pain, moderate pain or headache. Reported on 05/10/2016    Historical Provider, MD  ALPRAZolam Duanne Moron) 0.25 MG tablet Take 1 tablet (0.25 mg total) by mouth at bedtime as needed for anxiety or sleep. Patient not  taking: Reported on 07/12/2016 07/01/16   Orpah Greek, MD  aspirin EC 81 MG tablet Take 81 mg by mouth daily.      Historical Provider, MD  Cholecalciferol (VITAMIN D) 2000 units tablet Take 2,000 Units by mouth daily.    Historical Provider, MD  clonazePAM (KLONOPIN) 0.5 MG tablet Take 0.5-1 tablets (0.25-0.5 mg total) by mouth 2 (two) times daily as needed for anxiety (or tinnitus). 123XX123   Delora Fuel, MD  lisinopril (PRINIVIL,ZESTRIL) 10 MG tablet Take 10 mg by mouth at bedtime.    Historical Provider, MD  Vitamins-Lipotropics (LIPO-FLAVONOID PLUS) TABS Take 1 tablet by mouth daily.    Historical Provider, MD    Family History Family History  Problem Relation Age of Onset  . Diabetes Mother   . Hypertension Father     Social History Social History  Substance Use Topics  . Smoking status: Never Smoker  . Smokeless tobacco: Never Used  . Alcohol use No     Allergies   Aspirin; Hydroxyzine; and Valium [diazepam]   Review of Systems Review of Systems  Genitourinary: Positive for dysuria, frequency, hematuria and urgency.  All other systems reviewed and are negative.    Physical Exam Updated Vital Signs BP 153/82 (BP Location: Right Arm)   Pulse 99   Temp 97.9 F (36.6 C) (Oral)   Resp 17   SpO2 99%   Physical  Exam  Constitutional: She is oriented to person, place, and time. She appears well-developed and well-nourished.  HENT:  Head: Normocephalic and atraumatic.  Eyes: Conjunctivae and EOM are normal. Pupils are equal, round, and reactive to light.  Neck: Normal range of motion. Neck supple.  Cardiovascular: Normal rate and regular rhythm.  Exam reveals no gallop and no friction rub.   No murmur heard. Pulmonary/Chest: Effort normal and breath sounds normal. No respiratory distress. She has no wheezes. She has no rales. She exhibits no tenderness.  Abdominal: Soft. Bowel sounds are normal. She exhibits no distension and no mass. There is no tenderness.  There is no rebound and no guarding.  No focal abdominal tenderness, no RLQ tenderness or pain at McBurney's point, no RUQ tenderness or Murphy's sign, no left-sided abdominal tenderness, no fluid wave, or signs of peritonitis   Musculoskeletal: Normal range of motion. She exhibits no edema or tenderness.  Neurological: She is alert and oriented to person, place, and time.  Skin: Skin is warm and dry.  Psychiatric: She has a normal mood and affect. Her behavior is normal. Judgment and thought content normal.  Nursing note and vitals reviewed.    ED Treatments / Results  Labs (all labs ordered are listed, but only abnormal results are displayed) Labs Reviewed  URINE CULTURE  CBC WITH DIFFERENTIAL/PLATELET  BASIC METABOLIC PANEL  URINALYSIS, ROUTINE W REFLEX MICROSCOPIC (NOT AT [facility])    EKG  EKG Interpretation None       Radiology No results found.  Procedures Procedures (including critical care time)  Medications Ordered in ED Medications - No data to display   Initial Impression / Assessment and Plan / ED Course  I have reviewed the triage vital signs and the nursing notes.  Pertinent labs & imaging results that were available during my care of the patient were reviewed by me and considered in my medical decision making (see chart for details).  Clinical Course    Patient with dysuria.  Will check UA, labs, and culture.  No abdominal or pelvic pain on exam.  VSS.  Afebrile.    Symptoms and UA consistent with UTI.  Labs look otherwise reassuring.  Will discharge to home with abx.  PCP follow-up.  Final Clinical Impressions(s) / ED Diagnoses   Final diagnoses:  Dysuria    New Prescriptions New Prescriptions   CEPHALEXIN (KEFLEX) 500 MG CAPSULE    Take 1 capsule (500 mg total) by mouth 2 (two) times daily.     Montine Circle, PA-C 09/14/16 0818    Orpah Greek, MD 09/15/16 [phone]

## 2016-09-14 NOTE — ED Triage Notes (Signed)
Patient c/o bright red blood and some clots in her urine that began early this morning.  Patient denies pain but, states that if she does not urinate when she has the urge she will "go on myself"

## 2016-09-14 NOTE — ED Notes (Signed)
Pt ambulates frequently to bathroom without assistance.  Pt steady and denies dizziness or weakness with ambulation.  MD at bedside.

## 2016-09-14 NOTE — ED Triage Notes (Signed)
Correction to previous entry, patient does have pain low in her pelvis.  Rates pain 7/10

## 2016-09-16 LAB — URINE CULTURE

## 2016-09-17 ENCOUNTER — Telehealth (HOSPITAL_BASED_OUTPATIENT_CLINIC_OR_DEPARTMENT_OTHER): Payer: Self-pay

## 2016-09-17 NOTE — Telephone Encounter (Signed)
Post ED Visit - Positive Culture Follow-up  Culture report reviewed by antimicrobial stewardship pharmacist:  []  Elenor Quinones, Pharm.D. []  Heide Guile, Pharm.D., BCPS []  Parks Neptune, Pharm.D. []  Alycia Rossetti, Pharm.D., BCPS [x]  [place], Pharm.D., BCPS, AAHIVP []  Legrand Como, Pharm.D., BCPS, AAHIVP []  Milus Glazier, Pharm.D. []  Stephens November, Pharm.D.  Positive urine culture Treated with Cephalexin, organism sensitive to the same and no further patient follow-up is required at this time.  Genia Del 09/17/2016, 2:36 PM

## 2016-09-26 ENCOUNTER — Encounter (HOSPITAL_COMMUNITY): Payer: Self-pay | Admitting: Emergency Medicine

## 2016-09-26 ENCOUNTER — Emergency Department (HOSPITAL_COMMUNITY)
Admission: EM | Admit: 2016-09-26 | Discharge: 2016-09-27 | Disposition: A | Discharge status: Home / Self Care | Payer: Medicare Other | Attending: Emergency Medicine | Admitting: Emergency Medicine

## 2016-09-26 DIAGNOSIS — G44209 Tension-type headache, unspecified, not intractable: Secondary | ICD-10-CM | POA: Diagnosis not present

## 2016-09-26 DIAGNOSIS — Z79899 Other long term (current) drug therapy: Secondary | ICD-10-CM | POA: Diagnosis not present

## 2016-09-26 DIAGNOSIS — I10 Essential (primary) hypertension: Secondary | ICD-10-CM | POA: Diagnosis not present

## 2016-09-26 DIAGNOSIS — R51 Headache: Secondary | ICD-10-CM | POA: Diagnosis present

## 2016-09-26 DIAGNOSIS — Z7982 Long term (current) use of aspirin: Secondary | ICD-10-CM | POA: Diagnosis not present

## 2016-09-26 MED ORDER — ACETAMINOPHEN 500 MG PO TABS
1000.0000 mg | ORAL_TABLET | Freq: Once | ORAL | Status: AC
  Administered 2016-09-26: 1000 mg via ORAL
  Filled 2016-09-26: qty 2

## 2016-09-26 NOTE — ED Provider Notes (Signed)
[facility] DEPT Provider Note   CSN: [id number] Arrival date & time: 09/26/16  2033     History   Chief Complaint Chief Complaint  Patient presents with  . Headache    HPI Dolce Loch is a 75 y.o. female.  The history is provided by the patient.  Headache   This is a new problem. The current episode started 12 to 24 hours ago. The problem occurs constantly. The problem has been gradually worsening. The headache is associated with nothing. Pain location: top of head. The quality of the pain is described as dull. The pain is moderate. The pain does not radiate. Pertinent negatives include no anorexia, no fever, no near-syncope, no syncope and no vomiting. She has tried nothing for the symptoms.    Past Medical History:  Diagnosis Date  . Constipation   . Depression    "following husband's death"  . Hypertension   . Tinnitus     Patient Active Problem List   Diagnosis Date Noted  . Grief reaction 10/20/2013  . Hyponatremia 10/20/2013  . Obesity 10/20/2013  . Atypical chest pain 09/01/2013  . ALLERGIC RHINITIS 08/11/2009  . KNEE PAIN, BILATERAL 02/13/2009  . COUGH 02/13/2009  . INSOMNIA 07/17/2008  . TIREDNESS 04/16/2008  . HYPERTENSION 01/24/2008  . BACK PAIN, LUMBAR 01/22/2008  . HYPERGLYCEMIA, BORDERLINE 01/27/2007    Past Surgical History:  Procedure Laterality Date  . ABDOMINAL HYSTERECTOMY    . fribroid surgery      OB History    No data available       Home Medications    Prior to Admission medications   Medication Sig Start Date End Date Taking? Authorizing Provider  aspirin EC 81 MG tablet Take 81 mg by mouth daily.     Yes Historical Provider, MD  Calcium Carbonate-Vitamin D 600-400 MG-UNIT tablet Take 1 tablet by mouth daily.   Yes Historical Provider, MD  clonazePAM (KLONOPIN) 0.5 MG tablet Take 0.5-1 tablets (0.25-0.5 mg total) by mouth 2 (two) times daily as needed for anxiety (or tinnitus). 123XX123  Yes Delora Fuel, MD  lisinopril  (PRINIVIL,ZESTRIL) 10 MG tablet Take 10 mg by mouth daily.    Yes Historical Provider, MD  Melatonin 5 MG TABS Take 1 tablet by mouth every morning.   Yes Historical Provider, MD  acetaminophen (TYLENOL) 325 MG tablet Take 325-650 mg by mouth every 6 (six) hours as needed for mild pain, moderate pain or headache. Reported on 05/10/2016    Historical Provider, MD  ALPRAZolam Duanne Moron) 0.25 MG tablet Take 1 tablet (0.25 mg total) by mouth at bedtime as needed for anxiety or sleep. Patient not taking: Reported on 09/26/2016 07/01/16   Orpah Greek, MD  cephALEXin (KEFLEX) 500 MG capsule Take 1 capsule (500 mg total) by mouth 2 (two) times daily. Patient not taking: Reported on 09/26/2016 09/14/16   Montine Circle, PA-C    Family History Family History  Problem Relation Age of Onset  . Diabetes Mother   . Hypertension Father     Social History Social History  Substance Use Topics  . Smoking status: Never Smoker  . Smokeless tobacco: Never Used  . Alcohol use No     Allergies   Aspirin; Hydroxyzine; and Valium [diazepam]   Review of Systems Review of Systems  Constitutional: Negative for fever.  Cardiovascular: Negative for syncope and near-syncope.  Gastrointestinal: Negative for anorexia and vomiting.  Neurological: Positive for headaches.  All other systems reviewed and are negative.    Physical  Exam Updated Vital Signs BP 119/78 (BP Location: Left Arm)   Pulse 93   Temp 98.1 F (36.7 C) (Oral)   Resp 16   Ht 5\' 3"  (1.6 m)   Wt 180 lb (81.6 kg)   SpO2 97%   BMI 31.89 kg/m   Physical Exam  Constitutional: She is oriented to person, place, and time. She appears well-developed and well-nourished. No distress.  HENT:  Head: Normocephalic.  Nose: Nose normal.  Eyes: Conjunctivae are normal.  Neck: Neck supple. No tracheal deviation present.  Cardiovascular: Normal rate and regular rhythm.   Pulmonary/Chest: Effort normal. No respiratory distress.  Abdominal:  Soft. She exhibits no distension.  Neurological: She is alert and oriented to person, place, and time. She has normal strength. No cranial nerve deficit or sensory deficit. Coordination and gait normal. GCS eye subscore is 4. GCS verbal subscore is 5. GCS motor subscore is 6.  Skin: Skin is warm and dry.  Psychiatric: She has a normal mood and affect.     ED Treatments / Results  Labs (all labs ordered are listed, but only abnormal results are displayed) Labs Reviewed - No data to display  EKG  EKG Interpretation None       Radiology No results found.  Procedures Procedures (including critical care time)  Medications Ordered in ED Medications  acetaminophen (TYLENOL) tablet 1,000 mg (1,000 mg Oral Given 09/26/16 2321)     Initial Impression / Assessment and Plan / ED Course  I have reviewed the triage vital signs and the nursing notes.  Pertinent labs & imaging results that were available during my care of the patient were reviewed by me and considered in my medical decision making (see chart for details).  Clinical Course    75 year old female presents with headache that started this morning on the top of her head. It was gradual onset and began to worsen throughout the day. She took no medications for the headache prior to arrival. Neurologically intact, well appearing. Provided Tylenol here with relief of symptoms. She is concerned that this is a 3 year anniversary of her husband's death who she misses very much and this precipitated her headache d/t stress level increase. She does not have enough money for a cab ride home since she took a cab here and will need to wait in the emergency department overnight until the buses are running in the morning. No red flag symptoms for ICH. Plan to follow up with PCP as needed and return precautions discussed for worsening or new concerning symptoms.   Final Clinical Impressions(s) / ED Diagnoses   Final diagnoses:  Tension-type  headache, not intractable, unspecified chronicity pattern    New Prescriptions New Prescriptions   No medications on file     Leo Grosser, MD 09/27/16 [phone]

## 2016-09-26 NOTE — ED Triage Notes (Signed)
Pt reports headache since this morning. Denies N/V/D, light sensitivity, and congestion.

## 2016-10-12 ENCOUNTER — Encounter (HOSPITAL_COMMUNITY): Payer: Self-pay | Admitting: Emergency Medicine

## 2016-10-12 ENCOUNTER — Emergency Department (HOSPITAL_COMMUNITY)
Admission: EM | Admit: 2016-10-12 | Discharge: 2016-10-12 | Disposition: A | Discharge status: Home / Self Care | Payer: Medicare Other | Attending: Emergency Medicine | Admitting: Emergency Medicine

## 2016-10-12 DIAGNOSIS — R1084 Generalized abdominal pain: Secondary | ICD-10-CM | POA: Diagnosis not present

## 2016-10-12 DIAGNOSIS — N3000 Acute cystitis without hematuria: Secondary | ICD-10-CM | POA: Diagnosis not present

## 2016-10-12 DIAGNOSIS — I10 Essential (primary) hypertension: Secondary | ICD-10-CM | POA: Diagnosis not present

## 2016-10-12 DIAGNOSIS — N3001 Acute cystitis with hematuria: Secondary | ICD-10-CM | POA: Insufficient documentation

## 2016-10-12 DIAGNOSIS — R1030 Lower abdominal pain, unspecified: Secondary | ICD-10-CM | POA: Diagnosis present

## 2016-10-12 DIAGNOSIS — Z79899 Other long term (current) drug therapy: Secondary | ICD-10-CM | POA: Diagnosis not present

## 2016-10-12 LAB — COMPREHENSIVE METABOLIC PANEL
ALBUMIN: 3.7 g/dL (ref 3.5–5.0)
ALT: 26 U/L (ref 14–54)
ANION GAP: 8 (ref 5–15)
AST: 22 U/L (ref 15–41)
Alkaline Phosphatase: 80 U/L (ref 38–126)
BUN: 17 mg/dL (ref 6–20)
CO2: 24 mmol/L (ref 22–32)
Calcium: 8.8 mg/dL — ABNORMAL LOW (ref 8.9–10.3)
Chloride: 103 mmol/L (ref 101–111)
Creatinine, Ser: 0.95 mg/dL (ref 0.44–1.00)
GFR calc Af Amer: >60 mL/min (ref >60)
GFR, EST NON AFRICAN AMERICAN: 58 mL/min — AB (ref >60)
GLUCOSE: 124 mg/dL — AB (ref 65–99)
Potassium: 4.3 mmol/L (ref 3.5–5.1)
Sodium: 135 mmol/L (ref 135–145)
Total Bilirubin: 1.2 mg/dL (ref 0.3–1.2)
Total Protein: 7.2 g/dL (ref 6.5–8.1)

## 2016-10-12 LAB — URINE MICROSCOPIC-ADD ON

## 2016-10-12 LAB — CBC
HEMATOCRIT: 39.9 % (ref 36.0–46.0)
HEMOGLOBIN: 12.8 g/dL (ref 12.0–15.0)
MCH: 27.8 pg (ref 26.0–34.0)
MCHC: 32.1 g/dL (ref 30.0–36.0)
MCV: 86.7 fL (ref 78.0–100.0)
Platelets: 233 10*3/uL (ref 150–400)
RBC: 4.6 MIL/uL (ref 3.87–5.11)
RDW: 13 % (ref 11.5–15.5)
WBC: 14 10*3/uL — AB (ref 4.0–10.5)

## 2016-10-12 LAB — URINALYSIS, ROUTINE W REFLEX MICROSCOPIC
Bilirubin Urine: NEGATIVE
GLUCOSE, UA: NEGATIVE mg/dL
KETONES UR: NEGATIVE mg/dL
Nitrite: NEGATIVE
PH: 6 (ref 5.0–8.0)
Protein, ur: 100 mg/dL — AB
SPECIFIC GRAVITY, URINE: 1.012 (ref 1.005–1.030)

## 2016-10-12 LAB — LIPASE, BLOOD: LIPASE: 22 U/L (ref 11–51)

## 2016-10-12 MED ORDER — PHENAZOPYRIDINE HCL 200 MG PO TABS: 200.0000 mg | ORAL_TABLET | Freq: Three times a day (TID) | ORAL | 0 refills | Status: DC

## 2016-10-12 MED ORDER — CEPHALEXIN 500 MG PO CAPS
500.0000 mg | ORAL_CAPSULE | Freq: Three times a day (TID) | ORAL | Status: DC
  Administered 2016-10-12: 500 mg via ORAL
  Filled 2016-10-12: qty 1

## 2016-10-12 MED ORDER — CEPHALEXIN 500 MG PO CAPS: 500.0000 mg | ORAL_CAPSULE | Freq: Three times a day (TID) | ORAL | 0 refills | Status: DC

## 2016-10-12 MED ORDER — PHENAZOPYRIDINE HCL 200 MG PO TABS
200.0000 mg | ORAL_TABLET | Freq: Once | ORAL | Status: AC
  Administered 2016-10-12: 200 mg via ORAL
  Filled 2016-10-12: qty 1

## 2016-10-12 NOTE — Discharge Instructions (Signed)
Drink plenty of fluids. Take the keflex (cephalexin) until gone. Take the pyridium for pain on urination. Recheck if you get a fever, vomiting, worsening pain.

## 2016-10-12 NOTE — ED Triage Notes (Signed)
Brought in by EMS from home with c/o abdominal pain and dysuria, onset 2 days ago.   Pt reports pain is getting progressively worse.  Pt denies nausea or vomiting or hematuria.  T99.5 by EMS.

## 2016-10-12 NOTE — ED Notes (Signed)
Bed: [id number] Expected date:  Expected time:  Means of arrival:  Comments: 75 yo F/ ABD PAIN- FEVER

## 2016-10-12 NOTE — ED Provider Notes (Signed)
[facility] DEPT Provider Note   CSN: [id number] Arrival date & time: 10/12/16  0225 By signing my name below, I, Doran Stabler, attest that this documentation has been prepared under the direction and in the presence of Rolland Porter, MD. Electronically Signed: Doran Stabler, ED Scribe. 10/12/16. 3:33 AM.  Time seen 03:33 AM  History   Chief Complaint Chief Complaint  Patient presents with  . Abdominal Pain   The history is provided by the patient. No language interpreter was used.   HPI Comments: Allison Franco is a 75 y.o. female who presents to the Emergency Department via EMS with a PMHx of HTN complaining of constant lower abdominal discomfort that began 2 days ago. Her pain is worse with urination. Pt also reports dysuria, urinary urgency and urinary frequency. Pt denies any hematuria, appetite changes,  nausea, vomiting, fever, or any other symptoms at this time.   PCP Candise Bowens, MD   Past Medical History:  Diagnosis Date  . Constipation   . Depression    "following husband's death"  . Hypertension   . Tinnitus    Patient Active Problem List   Diagnosis Date Noted  . Grief reaction 10/20/2013  . Hyponatremia 10/20/2013  . Obesity 10/20/2013  . Atypical chest pain 09/01/2013  . ALLERGIC RHINITIS 08/11/2009  . KNEE PAIN, BILATERAL 02/13/2009  . COUGH 02/13/2009  . INSOMNIA 07/17/2008  . TIREDNESS 04/16/2008  . HYPERTENSION 01/24/2008  . BACK PAIN, LUMBAR 01/22/2008  . HYPERGLYCEMIA, BORDERLINE 01/27/2007   Past Surgical History:  Procedure Laterality Date  . ABDOMINAL HYSTERECTOMY    . fribroid surgery     OB History    No data available     Home Medications    Prior to Admission medications   Medication Sig Start Date End Date Taking? Authorizing Provider  acetaminophen (TYLENOL) 325 MG tablet Take 325-650 mg by mouth every 6 (six) hours as needed for mild pain, moderate pain or headache. Reported on 05/10/2016   Yes Historical Provider, MD    Calcium Carbonate-Vitamin D 600-400 MG-UNIT tablet Take 1 tablet by mouth daily.   Yes Historical Provider, MD  clonazePAM (KLONOPIN) 0.5 MG tablet Take 0.5-1 tablets (0.25-0.5 mg total) by mouth 2 (two) times daily as needed for anxiety (or tinnitus). 123XX123  Yes Delora Fuel, MD  lisinopril (PRINIVIL,ZESTRIL) 10 MG tablet Take 10 mg by mouth daily.    Yes Historical Provider, MD  Melatonin 5 MG TABS Take 1 tablet by mouth every morning.   Yes Historical Provider, MD  ALPRAZolam (XANAX) 0.25 MG tablet Take 1 tablet (0.25 mg total) by mouth at bedtime as needed for anxiety or sleep. Patient not taking: Reported on 09/26/2016 07/01/16   Orpah Greek, MD  cephALEXin (KEFLEX) 500 MG capsule Take 1 capsule (500 mg total) by mouth 3 (three) times daily. 10/12/16   Rolland Porter, MD  phenazopyridine (PYRIDIUM) 200 MG tablet Take 1 tablet (200 mg total) by mouth 3 (three) times daily. 10/12/16   Rolland Porter, MD   Family History Family History  Problem Relation Age of Onset  . Diabetes Mother   . Hypertension Father    Social History Social History  Substance Use Topics  . Smoking status: Never Smoker  . Smokeless tobacco: Never Used  . Alcohol use No   Widowed Lives at home alone  Allergies   Aspirin; Hydroxyzine; and Valium [diazepam]  Review of Systems Review of Systems  Constitutional: Negative for appetite change and fever.  Gastrointestinal: Positive for abdominal  pain. Negative for nausea and vomiting.  Genitourinary: Positive for frequency and urgency. Negative for hematuria.  All other systems reviewed and are negative.  Physical Exam Updated Vital Signs BP 135/61 (BP Location: Right Arm)   Pulse 97   Temp 98.4 F (36.9 C) (Oral)   Resp 19   Ht 5\' 3"  (1.6 m)   Wt 180 lb (81.6 kg)   SpO2 97%   BMI 31.89 kg/m   Vital signs normal    Physical Exam  Constitutional: She is oriented to person, place, and time. She appears well-developed and well-nourished.  Non-toxic  appearance. She does not appear ill. No distress.  HENT:  Head: Normocephalic and atraumatic.  Right Ear: External ear normal.  Left Ear: External ear normal.  Nose: Nose normal. No mucosal edema or rhinorrhea.  Mouth/Throat: Oropharynx is clear and moist and mucous membranes are normal. No dental abscesses or uvula swelling.  Eyes: Conjunctivae and EOM are normal. Pupils are equal, round, and reactive to light.  Neck: Normal range of motion and full passive range of motion without pain. Neck supple.  Cardiovascular: Normal rate, regular rhythm and normal heart sounds.  Exam reveals no gallop and no friction rub.   No murmur heard. Pulmonary/Chest: Effort normal and breath sounds normal. No respiratory distress. She has no wheezes. She has no rhonchi. She has no rales. She exhibits no tenderness and no crepitus.  Abdominal: Soft. Normal appearance and bowel sounds are normal. She exhibits no distension. There is no rebound and no guarding.  Mild lower abdominal tenderness  Musculoskeletal: Normal range of motion. She exhibits no edema or tenderness.  Moves all extremities well.   Neurological: She is alert and oriented to person, place, and time. She has normal strength. No cranial nerve deficit.  Skin: Skin is warm, dry and intact. No rash noted. No erythema. No pallor.  Psychiatric: She has a normal mood and affect. Her speech is normal and behavior is normal. Her mood appears not anxious.  Nursing note and vitals reviewed.  ED Treatments / Results  DIAGNOSTIC STUDIES: Oxygen Saturation is 97% on room air, normal by my interpretation.    Labs (all labs ordered are listed, but only abnormal results are displayed) Results for orders placed or performed during the hospital encounter of 10/12/16  Lipase, blood  Result Value Ref Range   Lipase 22 11 - 51 U/L  Comprehensive metabolic panel  Result Value Ref Range   Sodium 135 135 - 145 mmol/L   Potassium 4.3 3.5 - 5.1 mmol/L   Chloride  103 101 - 111 mmol/L   CO2 24 22 - 32 mmol/L   Glucose, Bld 124 (H) 65 - 99 mg/dL   BUN 17 6 - 20 mg/dL   Creatinine, Ser 0.95 0.44 - 1.00 mg/dL   Calcium 8.8 (L) 8.9 - 10.3 mg/dL   Total Protein 7.2 6.5 - 8.1 g/dL   Albumin 3.7 3.5 - 5.0 g/dL   AST 22 15 - 41 U/L   ALT 26 14 - 54 U/L   Alkaline Phosphatase 80 38 - 126 U/L   Total Bilirubin 1.2 0.3 - 1.2 mg/dL   GFR calc non Af Amer 58 (L) >60 mL/min   GFR calc Af Amer >60 >60 mL/min   Anion gap 8 5 - 15  CBC  Result Value Ref Range   WBC 14.0 (H) 4.0 - 10.5 K/uL   RBC 4.60 3.87 - 5.11 MIL/uL   Hemoglobin 12.8 12.0 - 15.0 g/dL  HCT 39.9 36.0 - 46.0 %   MCV 86.7 78.0 - 100.0 fL   MCH 27.8 26.0 - 34.0 pg   MCHC 32.1 30.0 - 36.0 g/dL   RDW 13.0 11.5 - 15.5 %   Platelets 233 150 - 400 K/uL  Urinalysis, Routine w reflex microscopic  Result Value Ref Range   Color, Urine YELLOW YELLOW   APPearance TURBID (A) CLEAR   Specific Gravity, Urine 1.012 1.005 - 1.030   pH 6.0 5.0 - 8.0   Glucose, UA NEGATIVE NEGATIVE mg/dL   Hgb urine dipstick LARGE (A) NEGATIVE   Bilirubin Urine NEGATIVE NEGATIVE   Ketones, ur NEGATIVE NEGATIVE mg/dL   Protein, ur 100 (A) NEGATIVE mg/dL   Nitrite NEGATIVE NEGATIVE   Leukocytes, UA LARGE (A) NEGATIVE  Urine microscopic-add on  Result Value Ref Range   Squamous Epithelial / LPF 0-5 (A) NONE SEEN   WBC, UA TOO NUMEROUS TO COUNT 0 - 5 WBC/hpf   RBC / HPF TOO NUMEROUS TO COUNT 0 - 5 RBC/hpf   Bacteria, UA FEW (A) NONE SEEN   Laboratory interpretation all normal except UTI, leukocytosis    Radiology No results found.  Procedures Procedures (including critical care time)  Medications Ordered in ED Medications  cephALEXin (KEFLEX) capsule 500 mg (500 mg Oral Given 10/12/16 0354)  phenazopyridine (PYRIDIUM) tablet 200 mg (200 mg Oral Given 10/12/16 0354)    Initial Impression / Assessment and Plan / ED Course  I have reviewed the triage vital signs and the nursing notes.  Pertinent labs &  imaging results that were available during my care of the patient were reviewed by me and considered in my medical decision making (see chart for details).  Clinical Course    COORDINATION OF CARE: 3:37 AM Discussed treatment plan with pt at bedside and pt agreed to plan.  Review of patient's urine culture done on October 18 showed she had 50,000 colonies of Escherichia coli that was resistant to ampicillin and Septra that was sensitive to cefazolin and Cipro. Due to concern of Cipro in the elderly she was started back on Keflex.  Final Clinical Impressions(s) / ED Diagnoses   Final diagnoses:  Acute cystitis with hematuria   New Prescriptions New Prescriptions   CEPHALEXIN (KEFLEX) 500 MG CAPSULE    Take 1 capsule (500 mg total) by mouth 3 (three) times daily.   PHENAZOPYRIDINE (PYRIDIUM) 200 MG TABLET    Take 1 tablet (200 mg total) by mouth 3 (three) times daily.   Plan discharge  Rolland Porter, MD, FACEP  I personally performed the services described in this documentation, which was scribed in my presence. The recorded information has been reviewed and considered.  Rolland Porter, MD, Barbette Or, MD 10/12/16 [phone]

## 2016-10-14 LAB — URINE CULTURE: Culture: >=100000 — AB

## 2016-10-15 ENCOUNTER — Telehealth (HOSPITAL_BASED_OUTPATIENT_CLINIC_OR_DEPARTMENT_OTHER): Payer: Self-pay

## 2016-10-15 NOTE — Telephone Encounter (Signed)
Post ED Visit - Positive Culture Follow-up  Culture report reviewed by antimicrobial stewardship pharmacist:  []  Elenor Quinones, Pharm.D. []  Heide Guile, Pharm.D., BCPS []  Parks Neptune, Pharm.D. []  Alycia Rossetti, Pharm.D., BCPS []  [place], [place].D., BCPS, AAHIVP []  Legrand Como, Pharm.D., BCPS, AAHIVP []  Milus Glazier, Pharm.D. []  Stephens November, Pharm.D. Ebony Hail Masters Pharm D Positive urine culture Treated with Cephalexin, organism sensitive to the same and no further patient follow-up is required at this time.  Genia Del 10/15/2016, 10:05 AM

## 2016-11-04 ENCOUNTER — Ambulatory Visit: Payer: Medicare Other | Admitting: Diagnostic Neuroimaging

## 2016-11-07 ENCOUNTER — Encounter: Payer: Self-pay | Admitting: Diagnostic Neuroimaging

## 2017-01-09 ENCOUNTER — Emergency Department (HOSPITAL_COMMUNITY)
Admission: EM | Admit: 2017-01-09 | Discharge: 2017-01-09 | Disposition: A | Discharge status: Home / Self Care | Payer: Medicare Other | Attending: Emergency Medicine | Admitting: Emergency Medicine

## 2017-01-09 ENCOUNTER — Encounter (HOSPITAL_COMMUNITY): Payer: Self-pay

## 2017-01-09 DIAGNOSIS — R05 Cough: Secondary | ICD-10-CM | POA: Diagnosis not present

## 2017-01-09 DIAGNOSIS — J069 Acute upper respiratory infection, unspecified: Secondary | ICD-10-CM | POA: Insufficient documentation

## 2017-01-09 DIAGNOSIS — I10 Essential (primary) hypertension: Secondary | ICD-10-CM | POA: Insufficient documentation

## 2017-01-09 DIAGNOSIS — Z79899 Other long term (current) drug therapy: Secondary | ICD-10-CM | POA: Insufficient documentation

## 2017-01-09 DIAGNOSIS — R51 Headache: Secondary | ICD-10-CM | POA: Diagnosis not present

## 2017-01-09 MED ORDER — BENZONATATE 100 MG PO CAPS: 100.0000 mg | ORAL_CAPSULE | Freq: Three times a day (TID) | ORAL | 0 refills | Status: DC

## 2017-01-09 NOTE — ED Provider Notes (Signed)
[facility] DEPT Provider Note   CSN: [id number] Arrival date & time: 01/09/17  [id number]  By signing my name below, I, Allison Franco, attest that this documentation has been prepared under the direction and in the presence of non-physician practitioner, Janetta Hora, PA-C. Electronically Signed: Jeanell Franco, Scribe. 01/09/2017. 10:32 AM.  History   Chief Complaint Chief Complaint  Patient presents with  . Cough  . Headache   The history is provided by the patient. No language interpreter was used.   HPI Comments: Allison Franco is a 76 y.o. female who presents to the Emergency Department complaining of intermittent moderate cough that started a few days ago. She states she has been having gradually worsening URI-like symptoms. She took tylenol for headache with minimal relief PTA. She describes the cough as intermittently productive of mild sputum. She reports associated chills, rhinorrhea, sneezing, sore throat, constipation, and headache. She admits to sick contacts. She denies any hx of smoking, fever, nausea, vomiting, diarrhea, or other complaints .     PCP: Candise Bowens, MD  Past Medical History:  Diagnosis Date  . Constipation   . Depression    "following husband's death"  . Hypertension   . Tinnitus     Patient Active Problem List   Diagnosis Date Noted  . Grief reaction 10/20/2013  . Hyponatremia 10/20/2013  . Obesity 10/20/2013  . Atypical chest pain 09/01/2013  . ALLERGIC RHINITIS 08/11/2009  . KNEE PAIN, BILATERAL 02/13/2009  . COUGH 02/13/2009  . INSOMNIA 07/17/2008  . TIREDNESS 04/16/2008  . HYPERTENSION 01/24/2008  . BACK PAIN, LUMBAR 01/22/2008  . HYPERGLYCEMIA, BORDERLINE 01/27/2007    Past Surgical History:  Procedure Laterality Date  . ABDOMINAL HYSTERECTOMY    . fribroid surgery      OB History    No data available       Home Medications    Prior to Admission medications   Medication Sig Start Date End Date Taking? Authorizing  Provider  acetaminophen (TYLENOL) 325 MG tablet Take 325-650 mg by mouth every 6 (six) hours as needed for mild pain, moderate pain or headache. Reported on 05/10/2016    Historical Provider, MD  benzonatate (TESSALON) 100 MG capsule Take 1 capsule (100 mg total) by mouth every 8 (eight) hours. 01/09/17   Recardo Evangelist, PA-C  Calcium Carbonate-Vitamin D 600-400 MG-UNIT tablet Take 1 tablet by mouth daily.    Historical Provider, MD  clonazePAM (KLONOPIN) 0.5 MG tablet Take 0.5-1 tablets (0.25-0.5 mg total) by mouth 2 (two) times daily as needed for anxiety (or tinnitus). [id number]   Delora Fuel, MD  lisinopril (PRINIVIL,ZESTRIL) 10 MG tablet Take 10 mg by mouth daily.     Historical Provider, MD  Melatonin 5 MG TABS Take 1 tablet by mouth every morning.    Historical Provider, MD  phenazopyridine (PYRIDIUM) 200 MG tablet Take 1 tablet (200 mg total) by mouth 3 (three) times daily. 10/12/16   Rolland Porter, MD    Family History Family History  Problem Relation Age of Onset  . Diabetes Mother   . Hypertension Father     Social History Social History  Substance Use Topics  . Smoking status: Never Smoker  . Smokeless tobacco: Never Used  . Alcohol use No     Allergies   Aspirin; Hydroxyzine; and Valium [diazepam]   Review of Systems Review of Systems  Constitutional: Positive for chills. Negative for fever.  HENT: Positive for rhinorrhea, sneezing and sore throat.   Respiratory: Positive for cough.  Gastrointestinal: Positive for constipation. Negative for diarrhea, nausea and vomiting.  Neurological: Positive for headaches.     Physical Exam Updated Vital Signs BP 138/71 (BP Location: Right Arm)   Pulse 95   Temp 98.4 F (36.9 C) (Oral)   Resp 20   Ht 5\' 3"  (1.6 m)   Wt 183 lb (83 kg)   SpO2 96%   BMI 32.42 kg/m   Physical Exam  Constitutional: She appears well-developed and well-nourished. No distress.  HENT:  Head: Normocephalic.  Mouth/Throat: Uvula is midline,  oropharynx is clear and moist and mucous membranes are normal. No oropharyngeal exudate, posterior oropharyngeal edema or posterior oropharyngeal erythema.  Mild tonsillar swelling and redness.   Eyes: Conjunctivae are normal.  Neck: Neck supple.  Cardiovascular: Normal rate and regular rhythm.  Exam reveals no gallop and no friction rub.   No murmur heard. Pulmonary/Chest: Effort normal. No respiratory distress. She has no wheezes. She has no rales.  Abdominal: Soft.  Musculoskeletal: Normal range of motion.  Neurological: She is alert.  Skin: Skin is warm and dry.  Psychiatric: She has a normal mood and affect.  Nursing note and vitals reviewed.    ED Treatments / Results  DIAGNOSTIC STUDIES: Oxygen Saturation is 96% on RA, normal by my interpretation.    COORDINATION OF CARE: 10:36 AM- Pt advised of plan for treatment and pt agrees.  Labs (all labs ordered are listed, but only abnormal results are displayed) Labs Reviewed - No data to display  EKG  EKG Interpretation None       Radiology No results found.  Procedures Procedures (including critical care time)  Medications Ordered in ED Medications - No data to display   Initial Impression / Assessment and Plan / ED Course  I have reviewed the triage vital signs and the nursing notes.  Pertinent labs & imaging results that were available during my care of the patient were reviewed by me and considered in my medical decision making (see chart for details).  Patients symptoms are consistent with URI, likely viral etiology. Discussed that antibiotics are not indicated for viral infections. Pt will be discharged with symptomatic treatment.  Verbalizes understanding and is agreeable with plan. Pt is hemodynamically stable & in NAD prior to dc.   Final Clinical Impressions(s) / ED Diagnoses   Final diagnoses:  Viral upper respiratory tract infection    New Prescriptions New Prescriptions   BENZONATATE (TESSALON)  100 MG CAPSULE    Take 1 capsule (100 mg total) by mouth every 8 (eight) hours.   I personally performed the services described in this documentation, which was scribed in my presence. The recorded information has been reviewed and is accurate.    Recardo Evangelist, PA-C 01/09/17 [place], MD 01/09/17 [phone]

## 2017-01-09 NOTE — ED Triage Notes (Signed)
Pt with runny nose, cough for several dose.  Headache.  No n/v or fever

## 2017-01-09 NOTE — Discharge Instructions (Signed)
Rest and drink plenty of fluids Tylenol and Ibuprofen for pain/fever Use Tessalon for cough as needed Return for worsening symptoms

## 2017-02-24 ENCOUNTER — Emergency Department (HOSPITAL_COMMUNITY)
Admission: EM | Admit: 2017-02-24 | Discharge: 2017-02-24 | Disposition: A | Discharge status: Home / Self Care | Payer: Medicare Other | Attending: Emergency Medicine | Admitting: Emergency Medicine

## 2017-02-24 ENCOUNTER — Encounter (HOSPITAL_COMMUNITY): Payer: Self-pay | Admitting: Emergency Medicine

## 2017-02-24 DIAGNOSIS — Y999 Unspecified external cause status: Secondary | ICD-10-CM | POA: Diagnosis not present

## 2017-02-24 DIAGNOSIS — Y939 Activity, unspecified: Secondary | ICD-10-CM | POA: Insufficient documentation

## 2017-02-24 DIAGNOSIS — Y929 Unspecified place or not applicable: Secondary | ICD-10-CM | POA: Diagnosis not present

## 2017-02-24 DIAGNOSIS — I10 Essential (primary) hypertension: Secondary | ICD-10-CM | POA: Insufficient documentation

## 2017-02-24 DIAGNOSIS — X501XXA Overexertion from prolonged static or awkward postures, initial encounter: Secondary | ICD-10-CM | POA: Insufficient documentation

## 2017-02-24 DIAGNOSIS — M25551 Pain in right hip: Secondary | ICD-10-CM | POA: Diagnosis not present

## 2017-02-24 MED ORDER — METHOCARBAMOL 500 MG PO TABS: 500.0000 mg | ORAL_TABLET | Freq: Two times a day (BID) | ORAL | 0 refills | Status: DC

## 2017-02-24 NOTE — ED Triage Notes (Signed)
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

## 2017-02-24 NOTE — ED Provider Notes (Signed)
[facility] DEPT Provider Note   CSN: [id number] Arrival date & time: 02/24/17  1040     History   Chief Complaint Chief Complaint  Patient presents with  . Hip Pain    HPI Allison Franco is a 76 y.o. female.  76 year old female presents with right-sided hip pain that is worse after sitting for prolonged period of times and better with ambulation. Denies any trauma to the hip. No prior surgeries. No fever or chills. No urinary symptoms. Patient has been relieved with Tylenol. States that when she stands up she feels a sharp pain then as she relates more gets better. Denies any numbness or tenderness to her right foot.      Past Medical History:  Diagnosis Date  . Constipation   . Depression    "following husband's death"  . Hypertension   . Tinnitus     Patient Active Problem List   Diagnosis Date Noted  . Grief reaction 10/20/2013  . Hyponatremia 10/20/2013  . Obesity 10/20/2013  . Atypical chest pain 09/01/2013  . ALLERGIC RHINITIS 08/11/2009  . KNEE PAIN, BILATERAL 02/13/2009  . COUGH 02/13/2009  . INSOMNIA 07/17/2008  . TIREDNESS 04/16/2008  . HYPERTENSION 01/24/2008  . BACK PAIN, LUMBAR 01/22/2008  . HYPERGLYCEMIA, BORDERLINE 01/27/2007    Past Surgical History:  Procedure Laterality Date  . ABDOMINAL HYSTERECTOMY    . fribroid surgery      OB History    No data available       Home Medications    Prior to Admission medications   Medication Sig Start Date End Date Taking? Authorizing Provider  acetaminophen (TYLENOL) 325 MG tablet Take 325-650 mg by mouth every 6 (six) hours as needed for mild pain, moderate pain or headache. Reported on 05/10/2016    Historical Provider, MD  benzonatate (TESSALON) 100 MG capsule Take 1 capsule (100 mg total) by mouth every 8 (eight) hours. 01/09/17   Recardo Evangelist, PA-C  Calcium Carbonate-Vitamin D 600-400 MG-UNIT tablet Take 1 tablet by mouth daily.    Historical Provider, MD  clonazePAM (KLONOPIN) 0.5 MG  tablet Take 0.5-1 tablets (0.25-0.5 mg total) by mouth 2 (two) times daily as needed for anxiety (or tinnitus). 12/12/70   Delora Fuel, MD  lisinopril (PRINIVIL,ZESTRIL) 10 MG tablet Take 10 mg by mouth daily.     Historical Provider, MD  Melatonin 5 MG TABS Take 1 tablet by mouth every morning.    Historical Provider, MD  phenazopyridine (PYRIDIUM) 200 MG tablet Take 1 tablet (200 mg total) by mouth 3 (three) times daily. 10/12/16   Rolland Porter, MD    Family History Family History  Problem Relation Age of Onset  . Diabetes Mother   . Hypertension Father     Social History Social History  Substance Use Topics  . Smoking status: Never Smoker  . Smokeless tobacco: Never Used  . Alcohol use No     Allergies   Aspirin; Hydroxyzine; and Valium [diazepam]   Review of Systems Review of Systems  All other systems reviewed and are negative.    Physical Exam Updated Vital Signs BP 120/65   Pulse 69   Temp 97.6 F (36.4 C)   Resp 16   SpO2 100%   Physical Exam  Constitutional: She is oriented to person, place, and time. She appears well-developed and well-nourished.  Non-toxic appearance. No distress.  HENT:  Head: Normocephalic and atraumatic.  Eyes: Conjunctivae, EOM and lids are normal. Pupils are equal, round, and reactive to light.  Neck: Normal range of motion. Neck supple. No tracheal deviation present. No thyroid mass present.  Cardiovascular: Normal rate, regular rhythm and normal heart sounds.  Exam reveals no gallop.   No murmur heard. Pulmonary/Chest: Effort normal and breath sounds normal. No stridor. No respiratory distress. She has no decreased breath sounds. She has no wheezes. She has no rhonchi. She has no rales.  Abdominal: Soft. Normal appearance and bowel sounds are normal. She exhibits no distension. There is no tenderness. There is no rebound and no CVA tenderness.  Musculoskeletal: Normal range of motion. She exhibits no edema or tenderness.       Right  hip: She exhibits normal range of motion, no bony tenderness and no deformity.  Neurological: She is alert and oriented to person, place, and time. She has normal strength. No cranial nerve deficit or sensory deficit. GCS eye subscore is 4. GCS verbal subscore is 5. GCS motor subscore is 6.  Skin: Skin is warm and dry. No abrasion and no rash noted.  Psychiatric: She has a normal mood and affect. Her speech is normal and behavior is normal.  Nursing note and vitals reviewed.    ED Treatments / Results  Labs (all labs ordered are listed, but only abnormal results are displayed) Labs Reviewed - No data to display  EKG  EKG Interpretation None       Radiology No results found.  Procedures Procedures (including critical care time)  Medications Ordered in ED Medications - No data to display   Initial Impression / Assessment and Plan / ED Course  I have reviewed the triage vital signs and the nursing notes.  Pertinent labs & imaging results that were available during my care of the patient were reviewed by me and considered in my medical decision making (see chart for details).     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

## 2017-02-24 NOTE — ED Notes (Signed)
Introduced self to pt. Gave anticipated timeline of care.

## 2017-02-27 DIAGNOSIS — I10 Essential (primary) hypertension: Secondary | ICD-10-CM | POA: Diagnosis not present

## 2017-02-27 DIAGNOSIS — G4700 Insomnia, unspecified: Secondary | ICD-10-CM | POA: Diagnosis not present

## 2017-02-27 DIAGNOSIS — R109 Unspecified abdominal pain: Secondary | ICD-10-CM | POA: Diagnosis not present

## 2017-02-27 DIAGNOSIS — Z1211 Encounter for screening for malignant neoplasm of colon: Secondary | ICD-10-CM | POA: Diagnosis not present

## 2017-03-13 ENCOUNTER — Other Ambulatory Visit: Payer: Self-pay | Admitting: Family Medicine

## 2017-03-13 DIAGNOSIS — Z1231 Encounter for screening mammogram for malignant neoplasm of breast: Secondary | ICD-10-CM

## 2017-04-04 ENCOUNTER — Ambulatory Visit
Admission: RE | Admit: 2017-04-04 | Discharge: 2017-04-04 | Disposition: A | Discharge status: Home / Self Care | Payer: Medicare Other | Source: Ambulatory Visit | Attending: Family Medicine | Admitting: Family Medicine

## 2017-04-04 DIAGNOSIS — Z1231 Encounter for screening mammogram for malignant neoplasm of breast: Secondary | ICD-10-CM

## 2017-05-20 ENCOUNTER — Emergency Department (HOSPITAL_COMMUNITY): Payer: Medicare Other

## 2017-05-20 ENCOUNTER — Encounter (HOSPITAL_COMMUNITY): Payer: Self-pay | Admitting: Emergency Medicine

## 2017-05-20 ENCOUNTER — Emergency Department (HOSPITAL_COMMUNITY)
Admission: EM | Admit: 2017-05-20 | Discharge: 2017-05-20 | Disposition: A | Discharge status: Home / Self Care | Payer: Medicare Other | Attending: Emergency Medicine | Admitting: Emergency Medicine

## 2017-05-20 DIAGNOSIS — W010XXA Fall on same level from slipping, tripping and stumbling without subsequent striking against object, initial encounter: Secondary | ICD-10-CM | POA: Diagnosis not present

## 2017-05-20 DIAGNOSIS — Y939 Activity, unspecified: Secondary | ICD-10-CM | POA: Insufficient documentation

## 2017-05-20 DIAGNOSIS — S8992XA Unspecified injury of left lower leg, initial encounter: Secondary | ICD-10-CM | POA: Diagnosis not present

## 2017-05-20 DIAGNOSIS — I10 Essential (primary) hypertension: Secondary | ICD-10-CM | POA: Diagnosis not present

## 2017-05-20 DIAGNOSIS — Y92009 Unspecified place in unspecified non-institutional (private) residence as the place of occurrence of the external cause: Secondary | ICD-10-CM | POA: Diagnosis not present

## 2017-05-20 DIAGNOSIS — Y999 Unspecified external cause status: Secondary | ICD-10-CM | POA: Insufficient documentation

## 2017-05-20 DIAGNOSIS — M25562 Pain in left knee: Secondary | ICD-10-CM | POA: Diagnosis not present

## 2017-05-20 DIAGNOSIS — M25512 Pain in left shoulder: Secondary | ICD-10-CM | POA: Diagnosis not present

## 2017-05-20 DIAGNOSIS — Z79899 Other long term (current) drug therapy: Secondary | ICD-10-CM | POA: Diagnosis not present

## 2017-05-20 DIAGNOSIS — S8002XA Contusion of left knee, initial encounter: Secondary | ICD-10-CM | POA: Diagnosis not present

## 2017-05-20 DIAGNOSIS — S4992XA Unspecified injury of left shoulder and upper arm, initial encounter: Secondary | ICD-10-CM | POA: Diagnosis not present

## 2017-05-20 DIAGNOSIS — W19XXXA Unspecified fall, initial encounter: Secondary | ICD-10-CM

## 2017-05-20 DIAGNOSIS — M25511 Pain in right shoulder: Secondary | ICD-10-CM | POA: Diagnosis not present

## 2017-05-20 DIAGNOSIS — S4991XA Unspecified injury of right shoulder and upper arm, initial encounter: Secondary | ICD-10-CM | POA: Diagnosis not present

## 2017-05-20 NOTE — ED Provider Notes (Signed)
[facility] DEPT Provider Note   CSN: [id number] Arrival date & time: 05/20/17  1816     History   Chief Complaint Chief Complaint  Patient presents with  . Fall    HPI Allison Franco is a 76 y.o. female.  HPI   Pt p/w bilateral shoulder pain and left knee pain following a trip and fall that occurred yesterday around 2pm.  States she was crossing the street to go to the busstop, turned right and didn't realize there was a curb, tripped over it and went straight down on her left knee and bilateral hands.  Was able to get herself up and continue with her day.  Has used ice, vaseline, and tylenol with some improvement.  Worst pain is in her left knee.  Does have soreness in both shoulders but is able to move them well, no weakness or numbness.  Denies hitting head, LOC.  Denies headache, neck or back pain, chest or abdominal pain, any other injury.    Past Medical History:  Diagnosis Date  . Constipation   . Depression    "following husband's death"  . Hypertension   . Tinnitus     Patient Active Problem List   Diagnosis Date Noted  . Grief reaction 10/20/2013  . Hyponatremia 10/20/2013  . Obesity 10/20/2013  . Atypical chest pain 09/01/2013  . ALLERGIC RHINITIS 08/11/2009  . KNEE PAIN, BILATERAL 02/13/2009  . COUGH 02/13/2009  . INSOMNIA 07/17/2008  . TIREDNESS 04/16/2008  . HYPERTENSION 01/24/2008  . BACK PAIN, LUMBAR 01/22/2008  . HYPERGLYCEMIA, BORDERLINE 01/27/2007    Past Surgical History:  Procedure Laterality Date  . ABDOMINAL HYSTERECTOMY    . fribroid surgery      OB History    No data available       Home Medications    Prior to Admission medications   Medication Sig Start Date End Date Taking? Authorizing Provider  acetaminophen (TYLENOL) 325 MG tablet Take 325-650 mg by mouth every 6 (six) hours as needed for mild pain, moderate pain or headache. Reported on 05/10/2016    [provider]  benzonatate (TESSALON) 100 MG capsule Take 1  capsule (100 mg total) by mouth every 8 (eight) hours. 01/09/17   Recardo Evangelist, PA-C  Calcium Carbonate-Vitamin D 600-400 MG-UNIT tablet Take 1 tablet by mouth daily.    [provider]  clonazePAM (KLONOPIN) 0.5 MG tablet Take 0.5-1 tablets (0.25-0.5 mg total) by mouth 2 (two) times daily as needed for anxiety (or tinnitus). 4/74/25   Delora Fuel, MD  lisinopril (PRINIVIL,ZESTRIL) 10 MG tablet Take 10 mg by mouth daily.     [provider]  Melatonin 5 MG TABS Take 1 tablet by mouth every morning.    [provider]  methocarbamol (ROBAXIN) 500 MG tablet Take 1 tablet (500 mg total) by mouth 2 (two) times daily. 02/24/17   Lacretia Leigh, MD  phenazopyridine (PYRIDIUM) 200 MG tablet Take 1 tablet (200 mg total) by mouth 3 (three) times daily. 10/12/16   Rolland Porter, MD    Family History Family History  Problem Relation Age of Onset  . Diabetes Mother   . Hypertension Father     Social History Social History  Substance Use Topics  . Smoking status: Never Smoker  . Smokeless tobacco: Never Used  . Alcohol use No     Allergies   Aspirin; Hydroxyzine; and Valium [diazepam]   Review of Systems Review of Systems  Respiratory: Negative for shortness of breath.  Cardiovascular: Negative for chest pain.  Gastrointestinal: Negative for abdominal pain.  Musculoskeletal: Positive for arthralgias.  Skin: Positive for wound.  Neurological: Negative for syncope, weakness, numbness and headaches.  Psychiatric/Behavioral: Negative for self-injury.     Physical Exam Updated Vital Signs BP 127/67 (BP Location: Right Arm)   Pulse 84   Temp 98.5 F (36.9 C) (Oral)   Resp 16   Ht 5\' 3"  (1.6 m)   Wt 85.7 kg (189 lb)   SpO2 97%   BMI 33.48 kg/m   Physical Exam  Constitutional: She appears well-developed and well-nourished. No distress.  HENT:  Head: Normocephalic and atraumatic.  Eyes: Conjunctivae are normal.  Neck: Normal range of motion. Neck  supple.  Cardiovascular: Normal rate, regular rhythm and normal heart sounds.   Pulmonary/Chest: Effort normal. No respiratory distress. She has no wheezes. She has no rales. She exhibits no tenderness.  Abdominal: Soft. She exhibits no distension and no mass. There is no tenderness. There is no rebound and no guarding.  Musculoskeletal: Normal range of motion. She exhibits no tenderness.  Left knee and proximal tibia with anterior tenderness, abrasion, few blisters.  No laxity of joint.  Ranges knee well.  Ambulates without difficulty.  Distal pulses and sensation intact.  No calf edema.  Compartments are soft.    Neurological: She is alert. She exhibits normal muscle tone.  Skin: She is not diaphoretic.  Psychiatric: She has a normal mood and affect. Her behavior is normal.  Nursing note and vitals reviewed.    ED Treatments / Results  Labs (all labs ordered are listed, but only abnormal results are displayed) Labs Reviewed - No data to display  EKG  EKG Interpretation None       Radiology Dg Shoulder Right  Result Date: 05/20/2017 CLINICAL DATA:  Fall yesterday with pain. EXAM: RIGHT SHOULDER - 2+ VIEW COMPARISON:  None. FINDINGS: There is no evidence of fracture or dislocation. There is no evidence of arthropathy or other focal bone abnormality. Soft tissues are unremarkable. IMPRESSION: Negative. Electronically Signed   By: Dorise Bullion III M.D   On: 05/20/2017 19:14   Dg Shoulder Left  Result Date: 05/20/2017 CLINICAL DATA:  Pain after fall yesterday EXAM: LEFT SHOULDER - 2+ VIEW COMPARISON:  None. FINDINGS: There is no evidence of fracture or dislocation. There is no evidence of arthropathy or other focal bone abnormality. Soft tissues are unremarkable. IMPRESSION: Negative. Electronically Signed   By: Dorise Bullion III M.D   On: 05/20/2017 19:14   Dg Knee Complete 4 Views Left  Result Date: 05/20/2017 CLINICAL DATA:  Pain after fall yesterday EXAM: LEFT KNEE -  COMPLETE 4+ VIEW COMPARISON:  None. FINDINGS: The may be a tiny effusion. Degenerative changes are seen in the medial patellofemoral compartments. No fractures are seen. IMPRESSION: No fractures identified. Degenerative changes. Possible tiny effusion. Electronically Signed   By: Dorise Bullion III M.D   On: 05/20/2017 19:15    Procedures Procedures (including critical care time)  Medications Ordered in ED Medications - No data to display   Initial Impression / Assessment and Plan / ED Course  I have reviewed the triage vital signs and the nursing notes.  Pertinent labs & imaging results that were available during my care of the patient were reviewed by me and considered in my medical decision making (see chart for details).     Afebrile, nontoxic patient with injury to her left knee and bilateral shoulder while tripping and falling yesterday.  Xray of shoulder negative, left knee with small effusion.  Pt is ambulatory.  Denies head injury or any other injury or pain.  ACE wrap placed.   D/C home with PCP follow up.  Discussed result, findings, treatment, and follow up  with patient.  Pt given return precautions.  Pt verbalizes understanding and agrees with plan.      Final Clinical Impressions(s) / ED Diagnoses   Final diagnoses:  Fall, initial encounter  Contusion of left knee, initial encounter    New Prescriptions Discharge Medication List as of 05/20/2017  7:31 PM       Clayton Bibles, Hershal Coria 05/20/17 1944    Lacretia Leigh, MD 05/20/17 2349

## 2017-05-20 NOTE — ED Triage Notes (Addendum)
Pt from home with complaints of left shoulder and knee pain following a fall yesterday. Pt states she "just stumbled because she was rushing to catch a bus and she missed a step". Pt denies LOC and does not use blood thinners. Pt is ambulatory without alteration in gait and has full ROM of all extremities. Pt has small superficial abrasion on left knee

## 2017-05-20 NOTE — Discharge Instructions (Signed)
Read the information below.  You may return to the Emergency Department at any time for worsening condition or any new symptoms that concern you.   If you develop uncontrolled pain, weakness or numbness of the extremity, severe discoloration of the skin, or you are unable to walk, return to the ER for a recheck.    °

## 2017-06-01 DIAGNOSIS — Z9181 History of falling: Secondary | ICD-10-CM | POA: Diagnosis not present

## 2017-06-01 DIAGNOSIS — I10 Essential (primary) hypertension: Secondary | ICD-10-CM | POA: Diagnosis not present

## 2017-06-01 DIAGNOSIS — G4700 Insomnia, unspecified: Secondary | ICD-10-CM | POA: Diagnosis not present

## 2017-06-01 DIAGNOSIS — M25562 Pain in left knee: Secondary | ICD-10-CM | POA: Diagnosis not present

## 2017-06-16 DIAGNOSIS — H43813 Vitreous degeneration, bilateral: Secondary | ICD-10-CM | POA: Diagnosis not present

## 2017-06-16 DIAGNOSIS — H25093 Other age-related incipient cataract, bilateral: Secondary | ICD-10-CM | POA: Diagnosis not present

## 2017-06-23 ENCOUNTER — Encounter (HOSPITAL_COMMUNITY): Payer: Self-pay

## 2017-06-23 ENCOUNTER — Emergency Department (HOSPITAL_COMMUNITY)
Admission: EM | Admit: 2017-06-23 | Discharge: 2017-06-23 | Disposition: A | Discharge status: Home / Self Care | Payer: Medicare Other | Attending: Emergency Medicine | Admitting: Emergency Medicine

## 2017-06-23 DIAGNOSIS — F419 Anxiety disorder, unspecified: Secondary | ICD-10-CM | POA: Insufficient documentation

## 2017-06-23 DIAGNOSIS — I10 Essential (primary) hypertension: Secondary | ICD-10-CM | POA: Insufficient documentation

## 2017-06-23 DIAGNOSIS — Z79899 Other long term (current) drug therapy: Secondary | ICD-10-CM | POA: Insufficient documentation

## 2017-06-23 DIAGNOSIS — R030 Elevated blood-pressure reading, without diagnosis of hypertension: Secondary | ICD-10-CM | POA: Diagnosis not present

## 2017-06-23 MED ORDER — DIPHENHYDRAMINE HCL 25 MG PO CAPS
25.0000 mg | ORAL_CAPSULE | Freq: Once | ORAL | Status: AC
  Administered 2017-06-23: 25 mg via ORAL
  Filled 2017-06-23: qty 1

## 2017-06-23 NOTE — ED Notes (Signed)
Bed: [facility]PT2 Expected date:  Expected time:  Means of arrival:  Comments: 

## 2017-06-23 NOTE — ED Notes (Signed)
Bed: GY17 Expected date:  Expected time:  Means of arrival:  Comments: RN has Rm 5

## 2017-06-23 NOTE — ED Triage Notes (Signed)
Pt brought in by EMS from home. Pt take Clonazepam for anxiety and ran out of medication 2 days ago, pt PCP discontinued medication and encouraged pt to take melatonin. Pt   Pt feared staying alone tonight, and called EMS.   BP 164/98 HR 100, RR20,

## 2017-06-23 NOTE — ED Provider Notes (Signed)
[facility] DEPT Provider Note   CSN: [id number] Arrival date & time: 06/23/17  0109     History   Chief Complaint Chief Complaint  Patient presents with  . Anxiety    HPI Allison Franco is a 76 y.o. female.  The history is provided by the patient.  She comes in because of difficulty sleeping and anxiety. Her PCP had instructed her to stop taking clonazepam. She has been taking it at bedtime for the last 2 years. Dose is 0.5 mg. Tonight is the first night that she did not take it, and she was unable to sleep and felt generally restless. Her PCP had advised her to take melatonin, but it has not been helping. She denies homicidal or suicidal ideation.  Past Medical History:  Diagnosis Date  . Constipation   . Depression    "following husband's death"  . Hypertension   . Tinnitus     Patient Active Problem List   Diagnosis Date Noted  . Grief reaction 10/20/2013  . Hyponatremia 10/20/2013  . Obesity 10/20/2013  . Atypical chest pain 09/01/2013  . ALLERGIC RHINITIS 08/11/2009  . KNEE PAIN, BILATERAL 02/13/2009  . COUGH 02/13/2009  . INSOMNIA 07/17/2008  . TIREDNESS 04/16/2008  . HYPERTENSION 01/24/2008  . BACK PAIN, LUMBAR 01/22/2008  . HYPERGLYCEMIA, BORDERLINE 01/27/2007    Past Surgical History:  Procedure Laterality Date  . ABDOMINAL HYSTERECTOMY    . fribroid surgery      OB History    No data available       Home Medications    Prior to Admission medications   Medication Sig Start Date End Date Taking? Authorizing Provider  acetaminophen (TYLENOL) 325 MG tablet Take 325-650 mg by mouth every 6 (six) hours as needed for mild pain, moderate pain or headache. Reported on 05/10/2016    [provider]  benzonatate (TESSALON) 100 MG capsule Take 1 capsule (100 mg total) by mouth every 8 (eight) hours. 01/09/17   Recardo Evangelist, PA-C  Calcium Carbonate-Vitamin D 600-400 MG-UNIT tablet Take 1 tablet by mouth daily.    [provider]    clonazePAM (KLONOPIN) 0.5 MG tablet Take 0.5-1 tablets (0.25-0.5 mg total) by mouth 2 (two) times daily as needed for anxiety (or tinnitus). 03/06/80   Delora Fuel, MD  lisinopril (PRINIVIL,ZESTRIL) 10 MG tablet Take 10 mg by mouth daily.     [provider]  Melatonin 5 MG TABS Take 1 tablet by mouth every morning.    [provider]  methocarbamol (ROBAXIN) 500 MG tablet Take 1 tablet (500 mg total) by mouth 2 (two) times daily. 02/24/17   Lacretia Leigh, MD  phenazopyridine (PYRIDIUM) 200 MG tablet Take 1 tablet (200 mg total) by mouth 3 (three) times daily. 10/12/16   Rolland Porter, MD    Family History Family History  Problem Relation Age of Onset  . Diabetes Mother   . Hypertension Father     Social History Social History  Substance Use Topics  . Smoking status: Never Smoker  . Smokeless tobacco: Never Used  . Alcohol use No     Allergies   Aspirin; Hydroxyzine; and Valium [diazepam]   Review of Systems Review of Systems  All other systems reviewed and are negative.    Physical Exam Updated Vital Signs BP (!) 154/85 (BP Location: Right Arm)   Pulse 65   Temp 98.7 F (37.1 C) (Oral)   Resp 18   SpO2 99%   Physical Exam  Nursing note and  vitals reviewed.  76 year old female, resting comfortably and in no acute distress. Vital signs are significant for hypertension. Oxygen saturation is 99%, which is normal. Head is normocephalic and atraumatic. PERRLA, EOMI. Oropharynx is clear. Neck is nontender and supple without adenopathy or JVD. Back is nontender and there is no CVA tenderness. Lungs are clear without rales, wheezes, or rhonchi. Chest is nontender. Heart has regular rate and rhythm without murmur. Abdomen is soft, flat, nontender without masses or hepatosplenomegaly and peristalsis is normoactive. Extremities have no cyanosis or edema, full range of motion is present. Skin is warm and dry without rash. Neurologic: Mental status is  normal, cranial nerves are intact, there are no motor or sensory deficits.  ED Treatments / Results   Procedures Procedures (including critical care time)  Medications Ordered in ED Medications  diphenhydrAMINE (BENADRYL) capsule 25 mg (not administered)     Initial Impression / Assessment and Plan / ED Course  I have reviewed the triage vital signs and the nursing notes.  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 She is already on a low dose of clonazepam, so I don't feel there be any benefit to trying to taper further. Old records are reviewed, and she has no relevant past visits. I will ask her to take diphenhydramine to help her sleep in place of the clonazepam. She is referred back to her PCP.  Final Clinical Impressions(s) / ED Diagnoses   Final diagnoses:  Anxiety    New Prescriptions New Prescriptions   No medications on file     Delora Fuel, MD 16/38/46 [phone]

## 2017-06-23 NOTE — Discharge Instructions (Signed)
Take diphenhydramine (Benadryl) 25 mg every four hours as needed for anxiety. Take one at bedtime for the next week, then as needed.

## 2017-06-27 ENCOUNTER — Encounter (HOSPITAL_COMMUNITY): Payer: Self-pay

## 2017-06-27 ENCOUNTER — Emergency Department (HOSPITAL_COMMUNITY)
Admission: EM | Admit: 2017-06-27 | Discharge: 2017-06-27 | Disposition: A | Discharge status: Home / Self Care | Payer: Medicare Other | Attending: Emergency Medicine | Admitting: Emergency Medicine

## 2017-06-27 DIAGNOSIS — G4700 Insomnia, unspecified: Secondary | ICD-10-CM | POA: Diagnosis not present

## 2017-06-27 DIAGNOSIS — K5909 Other constipation: Secondary | ICD-10-CM | POA: Diagnosis not present

## 2017-06-27 DIAGNOSIS — I10 Essential (primary) hypertension: Secondary | ICD-10-CM | POA: Diagnosis not present

## 2017-06-27 DIAGNOSIS — F419 Anxiety disorder, unspecified: Secondary | ICD-10-CM | POA: Diagnosis not present

## 2017-06-27 DIAGNOSIS — Z1211 Encounter for screening for malignant neoplasm of colon: Secondary | ICD-10-CM | POA: Diagnosis not present

## 2017-06-27 DIAGNOSIS — Z79899 Other long term (current) drug therapy: Secondary | ICD-10-CM | POA: Insufficient documentation

## 2017-06-27 HISTORY — DX: Anxiety disorder, unspecified: F41.9

## 2017-06-27 MED ORDER — CLONAZEPAM 0.5 MG PO TABS: ORAL_TABLET | ORAL | 0 refills | Status: DC

## 2017-06-27 NOTE — ED Triage Notes (Signed)
Pt reports insomnia and tinnitus which she states is r/t recently stopping anxiety medication. Pt in no acute distress.

## 2017-06-27 NOTE — Discharge Instructions (Signed)
Follow up with Allison Franco for further management of anxiety

## 2017-06-27 NOTE — ED Notes (Signed)
ED Provider at bedside. 

## 2017-07-03 NOTE — ED Provider Notes (Signed)
[facility] DEPT Provider Note   CSN: [id number] Arrival date & time: 06/27/17  0125     History   Chief Complaint No chief complaint on file.   HPI Allison Franco is a 76 y.o. female.  Patient returns to the emergency department with complaint of difficulty sleeping. She describes ringing in her ears and "can't quiet my thoughts" when she lies down to sleep. She has been taking Clonazepam for years for same and her doctor recently stopped giving her this medication. She reports she has had trouble transitioning from having it to help sleep. No AVH. No other complaint.   The history is provided by the patient. No language interpreter was used.    Past Medical History:  Diagnosis Date  . Anxiety   . Constipation   . Depression    "following husband's death"  . Hypertension   . Tinnitus     Patient Active Problem List   Diagnosis Date Noted  . Grief reaction 10/20/2013  . Hyponatremia 10/20/2013  . Obesity 10/20/2013  . Atypical chest pain 09/01/2013  . ALLERGIC RHINITIS 08/11/2009  . KNEE PAIN, BILATERAL 02/13/2009  . COUGH 02/13/2009  . INSOMNIA 07/17/2008  . TIREDNESS 04/16/2008  . HYPERTENSION 01/24/2008  . BACK PAIN, LUMBAR 01/22/2008  . HYPERGLYCEMIA, BORDERLINE 01/27/2007    Past Surgical History:  Procedure Laterality Date  . ABDOMINAL HYSTERECTOMY    . fribroid surgery      OB History    No data available       Home Medications    Prior to Admission medications   Medication Sig Start Date End Date Taking? Authorizing Provider  acetaminophen (TYLENOL) 325 MG tablet Take 325-650 mg by mouth every 6 (six) hours as needed for mild pain, moderate pain or headache. Reported on 05/10/2016    [provider]  benzonatate (TESSALON) 100 MG capsule Take 1 capsule (100 mg total) by mouth every 8 (eight) hours. 01/09/17   Recardo Evangelist, PA-C  Calcium Carbonate-Vitamin D 600-400 MG-UNIT tablet Take 1 tablet by mouth daily.    [provider]  clonazePAM (KLONOPIN) 0.5 MG tablet Take one at bedtime for 4 days, then 1/2 for 6 days 06/27/17   Charlann Lange, PA-C  lisinopril (PRINIVIL,ZESTRIL) 10 MG tablet Take 10 mg by mouth daily.     [provider]  Melatonin 5 MG TABS Take 1 tablet by mouth every morning.    [provider]  methocarbamol (ROBAXIN) 500 MG tablet Take 1 tablet (500 mg total) by mouth 2 (two) times daily. 02/24/17   Lacretia Leigh, MD  phenazopyridine (PYRIDIUM) 200 MG tablet Take 1 tablet (200 mg total) by mouth 3 (three) times daily. 10/12/16   Rolland Porter, MD    Family History Family History  Problem Relation Age of Onset  . Diabetes Mother   . Hypertension Father     Social History Social History  Substance Use Topics  . Smoking status: Never Smoker  . Smokeless tobacco: Never Used  . Alcohol use No     Allergies   Aspirin; Hydroxyzine; and Valium [diazepam]   Review of Systems Review of Systems  Constitutional: Negative for fever.  Respiratory: Negative.   Cardiovascular: Negative.   Gastrointestinal: Negative.   Musculoskeletal: Negative.   Skin: Negative.   Neurological: Negative.   Psychiatric/Behavioral: Positive for sleep disturbance.     Physical Exam Updated Vital Signs BP 125/71 (BP Location: Left Arm)   Pulse 75   Temp 98.1 F (36.7 C) (Oral)  Resp 18   Ht 5\' 3"  (1.6 m)   Wt 83 kg (183 lb)   SpO2 100%   BMI 32.42 kg/m   Physical Exam  Constitutional: She is oriented to person, place, and time. She appears well-developed and well-nourished.  Neck: Normal range of motion.  Pulmonary/Chest: Effort normal.  Neurological: She is alert and oriented to person, place, and time.  Skin: Skin is warm and dry.     ED Treatments / Results  Labs (all labs ordered are listed, but only abnormal results are displayed) Labs Reviewed - No data to display  EKG  EKG Interpretation None       Radiology No results  found.  Procedures Procedures (including critical care time)  Medications Ordered in ED Medications - No data to display   Initial Impression / Assessment and Plan / ED Course  I have reviewed the triage vital signs and the nursing notes.  Pertinent labs & imaging results that were available during my care of the patient were reviewed by me and considered in my medical decision making (see chart for details).     Patient here with insomnia, previously on clonazepam and recently taken off by her doctor. She states there was no taper and feels this has contributed to symptoms. Will give taper dosing with limited prescription number and recommend follow up with PCP.   Final Clinical Impressions(s) / ED Diagnoses   Final diagnoses:  Anxiety  insomnia   New Prescriptions Discharge Medication List as of 06/27/2017  5:18 AM    START taking these medications   Details  clonazePAM (KLONOPIN) 0.5 MG tablet Take one at bedtime for 4 days, then 1/2 for 6 days, Print         Charlann Lange, PA-C 07/03/17 0359    Ripley Fraise, MD 07/03/17 1556

## 2017-07-09 ENCOUNTER — Encounter (HOSPITAL_COMMUNITY): Payer: Self-pay

## 2017-07-09 ENCOUNTER — Emergency Department (HOSPITAL_COMMUNITY)
Admission: EM | Admit: 2017-07-09 | Discharge: 2017-07-09 | Disposition: A | Discharge status: Home / Self Care | Payer: Medicare Other | Attending: Emergency Medicine | Admitting: Emergency Medicine

## 2017-07-09 DIAGNOSIS — H9313 Tinnitus, bilateral: Secondary | ICD-10-CM | POA: Diagnosis present

## 2017-07-09 DIAGNOSIS — Z5321 Procedure and treatment not carried out due to patient leaving prior to being seen by health care provider: Secondary | ICD-10-CM | POA: Diagnosis not present

## 2017-07-09 NOTE — ED Triage Notes (Signed)
Patient presents here with complain of ringing in the ears cause by taking trazodone pt states. Pt take clonazepam for anxiety and ran out of medication 2 weeks ago and her PCP to refilled her clonazepam and now she can not sleep. Pt state she took melatonin last night and it did not help her sleep.

## 2017-07-11 DIAGNOSIS — Z1212 Encounter for screening for malignant neoplasm of rectum: Secondary | ICD-10-CM | POA: Diagnosis not present

## 2017-07-11 DIAGNOSIS — Z1211 Encounter for screening for malignant neoplasm of colon: Secondary | ICD-10-CM | POA: Diagnosis not present

## 2017-07-17 DIAGNOSIS — G4700 Insomnia, unspecified: Secondary | ICD-10-CM | POA: Diagnosis not present

## 2017-07-17 DIAGNOSIS — F419 Anxiety disorder, unspecified: Secondary | ICD-10-CM | POA: Diagnosis not present

## 2017-07-17 DIAGNOSIS — H9311 Tinnitus, right ear: Secondary | ICD-10-CM | POA: Diagnosis not present

## 2017-07-19 LAB — COLOGUARD: Cologuard: NEGATIVE

## 2017-07-21 ENCOUNTER — Encounter (HOSPITAL_COMMUNITY): Payer: Self-pay | Admitting: Emergency Medicine

## 2017-07-21 DIAGNOSIS — H9121 Sudden idiopathic hearing loss, right ear: Secondary | ICD-10-CM | POA: Diagnosis not present

## 2017-07-21 DIAGNOSIS — R030 Elevated blood-pressure reading, without diagnosis of hypertension: Secondary | ICD-10-CM | POA: Diagnosis not present

## 2017-07-21 NOTE — ED Triage Notes (Addendum)
Reports having ringing in right ear for over 2 years.  Denies having any pain.  Also want left knee checked out.  Having pain in left knee for a week.

## 2017-07-22 ENCOUNTER — Emergency Department (HOSPITAL_COMMUNITY)
Admission: EM | Admit: 2017-07-22 | Discharge: 2017-07-22 | Disposition: A | Discharge status: Home / Self Care | Payer: Medicare Other | Attending: Emergency Medicine | Admitting: Emergency Medicine

## 2017-07-22 DIAGNOSIS — H9313 Tinnitus, bilateral: Secondary | ICD-10-CM | POA: Diagnosis present

## 2017-07-22 DIAGNOSIS — Z5321 Procedure and treatment not carried out due to patient leaving prior to being seen by health care provider: Secondary | ICD-10-CM | POA: Diagnosis not present

## 2017-07-22 NOTE — ED Notes (Signed)
Patient at nurse desk requesting to see the MD at this time. Patient updated on wait times.

## 2017-07-25 ENCOUNTER — Other Ambulatory Visit: Payer: Self-pay | Admitting: *Deleted

## 2017-07-25 NOTE — Patient Outreach (Signed)
[place] [facility]) Care Management  07/25/2017  Narya Beavin 10-22-1941 [id number]  [place]  Attempted#1 screening  outreach call to patient.  Patient was unavailable. Hip[employer] compliance voice mail left with return call back number. Plan: RN will call patient again within 14 days.   [facility] [phone].

## 2017-07-26 DIAGNOSIS — G4700 Insomnia, unspecified: Secondary | ICD-10-CM | POA: Diagnosis not present

## 2017-07-26 DIAGNOSIS — F419 Anxiety disorder, unspecified: Secondary | ICD-10-CM | POA: Diagnosis not present

## 2017-07-28 ENCOUNTER — Other Ambulatory Visit: Payer: Self-pay | Admitting: *Deleted

## 2017-07-28 NOTE — Patient Outreach (Signed)
[place] [facility]) Care Management  07/28/2017  Allison Franco 28-Apr-1941 [id number]   RN Health Coach attempted #2 screening outreach call to patient.  Patient was unavailable. HIPPA compliance voicemail message left with return callback number.  Plan: RN will call patient again within 14 days.  [employer] [facility] [phone]

## 2017-08-03 ENCOUNTER — Ambulatory Visit (HOSPITAL_COMMUNITY)
Admission: EM | Admit: 2017-08-03 | Discharge: 2017-08-03 | Disposition: A | Discharge status: Home / Self Care | Payer: Medicare Other | Attending: Internal Medicine | Admitting: Internal Medicine

## 2017-08-03 ENCOUNTER — Encounter (HOSPITAL_COMMUNITY): Payer: Self-pay | Admitting: Emergency Medicine

## 2017-08-03 DIAGNOSIS — R450 Nervousness: Secondary | ICD-10-CM

## 2017-08-03 DIAGNOSIS — F4329 Adjustment disorder with other symptoms: Secondary | ICD-10-CM

## 2017-08-03 DIAGNOSIS — F4323 Adjustment disorder with mixed anxiety and depressed mood: Secondary | ICD-10-CM

## 2017-08-03 DIAGNOSIS — F4381 Prolonged grief disorder: Secondary | ICD-10-CM

## 2017-08-03 LAB — POCT URINALYSIS DIP (DEVICE)
Bilirubin Urine: NEGATIVE
GLUCOSE, UA: NEGATIVE mg/dL
Ketones, ur: NEGATIVE mg/dL
LEUKOCYTES UA: NEGATIVE
NITRITE: NEGATIVE
Protein, ur: NEGATIVE mg/dL
Specific Gravity, Urine: 1.015 (ref 1.005–1.030)
UROBILINOGEN UA: 0.2 mg/dL (ref 0.0–1.0)
pH: 6.5 (ref 5.0–8.0)

## 2017-08-03 LAB — POCT I-STAT, CHEM 8
BUN: 15 mg/dL (ref 6–20)
CHLORIDE: 97 mmol/L — AB (ref 101–111)
Calcium, Ion: 1.16 mmol/L (ref 1.15–1.40)
Creatinine, Ser: 0.8 mg/dL (ref 0.44–1.00)
Glucose, Bld: 107 mg/dL — ABNORMAL HIGH (ref 65–99)
HEMATOCRIT: 44 % (ref 36.0–46.0)
Hemoglobin: 15 g/dL (ref 12.0–15.0)
Potassium: 4 mmol/L (ref 3.5–5.1)
SODIUM: 133 mmol/L — AB (ref 135–145)
TCO2: 27 mmol/L (ref 22–32)

## 2017-08-03 NOTE — ED Provider Notes (Signed)
[place]    CSN: [id number] Arrival date & time: 08/03/17  1303     History   Chief Complaint No chief complaint on file.   HPI Allison Franco is a 76 y.o. female.   76 year old obese female presents to the urgent care today after she had an experience last evening described as not being able to sleep well, her whole body felt heavy, she had some shaking and felt nervous and at one point in time felt darkness. There was no loss of consciousness or syncope or time in which she could not remember the events of the day or night. This occurred after she took her calcium tablet. She has a history of anxiety, depression and grief reaction after her husband died about 4 years ago. It is noted she has been in the emergency department a few times in the past 2-3 months for diagnosis of anxiety. She denies pain anywhere. No chest pain, heaviness, tightness or fullness. Denies shortness of breath urinary symptoms, abdominal pain or GI symptoms. Denies headache, dizziness or problems with vision, speech, hearing, swallowing. She states she does not know what happened last night and wants to find out.      Past Medical History:  Diagnosis Date  . Anxiety   . Constipation   . Depression    "following husband's death"  . Hypertension   . Tinnitus     Patient Active Problem List   Diagnosis Date Noted  . Grief reaction 10/20/2013  . Hyponatremia 10/20/2013  . Obesity 10/20/2013  . Atypical chest pain 09/01/2013  . ALLERGIC RHINITIS 08/11/2009  . KNEE PAIN, BILATERAL 02/13/2009  . COUGH 02/13/2009  . INSOMNIA 07/17/2008  . TIREDNESS 04/16/2008  . HYPERTENSION 01/24/2008  . BACK PAIN, LUMBAR 01/22/2008  . HYPERGLYCEMIA, BORDERLINE 01/27/2007    Past Surgical History:  Procedure Laterality Date  . ABDOMINAL HYSTERECTOMY    . fribroid surgery      OB History    No data available       Home Medications    Prior to Admission medications   Medication Sig Start Date  End Date Taking? Authorizing Provider  Calcium Carbonate-Vitamin D 600-400 MG-UNIT tablet Take 1 tablet by mouth daily.   Yes [provider]  lisinopril (PRINIVIL,ZESTRIL) 10 MG tablet Take 10 mg by mouth daily.    Yes [provider]  Melatonin 5 MG TABS Take 1 tablet by mouth every morning.   Yes [provider]  acetaminophen (TYLENOL) 325 MG tablet Take 325-650 mg by mouth every 6 (six) hours as needed for mild pain, moderate pain or headache. Reported on 05/10/2016    [provider]  methocarbamol (ROBAXIN) 500 MG tablet Take 1 tablet (500 mg total) by mouth 2 (two) times daily. 02/24/17   Lacretia Leigh, MD    Family History Family History  Problem Relation Age of Onset  . Diabetes Mother   . Hypertension Father     Social History Social History  Substance Use Topics  . Smoking status: Never Smoker  . Smokeless tobacco: Never Used  . Alcohol use No     Allergies   Aspirin; Hydroxyzine; and Valium [diazepam]   Review of Systems Review of Systems  Constitutional: Positive for activity change and fatigue. Negative for fever and unexpected weight change.  HENT: Positive for tinnitus.   Eyes: Negative for photophobia, pain, discharge, redness and itching.  Respiratory: Negative.   Cardiovascular: Negative.   Gastrointestinal: Negative.   Genitourinary: Negative.  Skin: Negative.   Neurological: Positive for tremors. Negative for dizziness, syncope, speech difficulty and headaches.  Psychiatric/Behavioral: Positive for dysphoric mood and sleep disturbance. The patient is nervous/anxious.   All other systems reviewed and are negative.    Physical Exam Triage Vital Signs ED Triage Vitals  Enc Vitals Group     BP      Pulse      Resp      Temp      Temp src      SpO2      Weight      Height      Head Circumference      Peak Flow      Pain Score      Pain Loc      Pain Edu?      Excl. in [place]?    No data found.   Updated  Vital Signs BP 114/62   Pulse 75   Temp 98.1 F (36.7 C) (Oral)   Resp 20   SpO2 96%   Visual Acuity Right Eye Distance:   Left Eye Distance:   Bilateral Distance:    Right Eye Near:   Left Eye Near:    Bilateral Near:     Physical Exam  Constitutional: She is oriented to person, place, and time. She appears well-developed and well-nourished. No distress.  HENT:  Head: Normocephalic and atraumatic.  Mouth/Throat: Oropharynx is clear and moist.  Eyes: Conjunctivae and EOM are normal. Left eye exhibits no discharge.  Neck: Normal range of motion. Neck supple. No tracheal deviation present.  Cardiovascular: Normal rate, regular rhythm, normal heart sounds and intact distal pulses.   Pulmonary/Chest: Effort normal and breath sounds normal. No respiratory distress. She has no wheezes. She has no rales.  Abdominal: Soft. There is no tenderness. There is no rebound.  Musculoskeletal: Normal range of motion. She exhibits edema. She exhibits no tenderness.  1+ pitting edema in the lower extremities.  Lymphadenopathy:    She has no cervical adenopathy.  Neurological: She is alert and oriented to person, place, and time. She has normal strength. She displays no tremor. No cranial nerve deficit or sensory deficit. She exhibits normal muscle tone.  Skin: Skin is warm and dry. No rash noted. She is not diaphoretic.  Psychiatric: She has a normal mood and affect.  Nursing note and vitals reviewed.    UC Treatments / Results  Labs (all labs ordered are listed, but only abnormal results are displayed) Labs Reviewed  POCT I-STAT, CHEM 8 - Abnormal; Notable for the following:       Result Value   Sodium 133 (*)    Chloride 97 (*)    Glucose, Bld 107 (*)    All other components within normal limits  POCT URINALYSIS DIP (DEVICE) - Abnormal; Notable for the following:    Hgb urine dipstick TRACE (*)    All other components within normal limits    EKG  EKG Interpretation None       ED ECG REPORT   Date: 08/03/2017  Rate: 66  Rhythm: normal sinus rhythm  QRS Axis: normal  Intervals: normal  ST/T Wave abnormalities: normal  Conduction Disutrbances:none  Narrative Interpretation: Q waves noted and lead 3 and aVF. This is the only difference in the old EKG of about 3 years ago, 01/01/2014.  Old EKG Reviewed: changes noted  I have personally reviewed the EKG tracing and agree with the computerized printout as noted.   Radiology No results  found.  Procedures Procedures (including critical care time)  Medications Ordered in UC Medications - No data to display   Initial Impression / Assessment and Plan / UC Course  I have reviewed the triage vital signs and the nursing notes.  Pertinent labs & imaging results that were available during my care of the patient were reviewed by me and considered in my medical decision making (see chart for details).       Final Clinical Impressions(s) / UC Diagnoses   Final diagnoses:  Adjustment disorder with mixed anxiety and depressed mood  Prolonged grief reaction    New Prescriptions Current Discharge Medication List       Controlled Substance Prescriptions [place] Controlled Substance Registry consulted? Not Applicable   Janne Napoleon, NP 08/03/17 1443

## 2017-08-03 NOTE — Discharge Instructions (Signed)
Your lab work was normal except for your salt was just a little bit low. EKG is normal. Recommend a couple shakes of salt today and tomorrow only. Your physical exam is normal. I suspect that  sy yourmptoms you had last night were related to anxiety, loneliness and grief reaction since her husband has died. This is a terrible feeling to have and you may want to ask your doctor about resources for [employer].

## 2017-08-03 NOTE — ED Triage Notes (Addendum)
Reports not feeling well last night.  Continues not to feel well today.  Patient reports shaking in general, yesterday.  .  While shaking, noticed room was getting dark.  Patient said she sat for awhile and feeling stopped.  Denies any chest pain.  Denies having any episode like yesterday, today.  Just not feeling well

## 2017-08-10 ENCOUNTER — Ambulatory Visit (HOSPITAL_COMMUNITY)
Admission: EM | Admit: 2017-08-10 | Discharge: 2017-08-10 | Disposition: A | Discharge status: Home / Self Care | Payer: Medicare Other | Attending: Family Medicine | Admitting: Family Medicine

## 2017-08-10 ENCOUNTER — Encounter (HOSPITAL_COMMUNITY): Payer: Self-pay | Admitting: Emergency Medicine

## 2017-08-10 DIAGNOSIS — F458 Other somatoform disorders: Secondary | ICD-10-CM | POA: Diagnosis not present

## 2017-08-10 DIAGNOSIS — R0989 Other specified symptoms and signs involving the circulatory and respiratory systems: Secondary | ICD-10-CM

## 2017-08-10 MED ORDER — RANITIDINE HCL 150 MG PO TABS: 150.0000 mg | ORAL_TABLET | Freq: Two times a day (BID) | ORAL | 0 refills | Status: DC

## 2017-08-10 NOTE — Discharge Instructions (Signed)
Please call your doctor to schedule a follow up appointment.

## 2017-08-10 NOTE — ED Triage Notes (Signed)
Pt states she feels like she has 'something in her throat down to her stomach' when she is breathing.  She also states that she feels her heart beating fast, intermittently.

## 2017-08-14 NOTE — ED Provider Notes (Signed)
  [place]   [id number] 08/10/17 Arrival Time: 9485  ASSESSMENT & PLAN:  1. Globus sensation     Meds ordered this encounter  Medications  . ranitidine (ZANTAC) 150 MG tablet    Sig: Take 1 tablet (150 mg total) by mouth 2 (two) times daily.    Dispense:  30 tablet    Refill:  0   Will schedule PCP for follow up to see if Ranitidine helps. May need GI evaluation.  Reviewed expectations re: course of current medical issues. Questions answered. Outlined signs and symptoms indicating need for more acute intervention. Patient verbalized understanding. After Visit Summary given.   SUBJECTIVE:  Allison Franco is a 76 y.o. female who presents with complaint of the feeling of something in her throat, like she can't fully swallow. Normal solid and liquid PO intake. No n/v. No h/o acid reflux. Symptoms began today. No worsening. Questions palpitations earlier. Very anxious. No CP or SOB. No new medications. No specific aggravating or alleviating factors reported. No OTC self treatment.  ROS: As per HPI.   OBJECTIVE:  Vitals:   08/10/17 1640  BP: 130/71  Pulse: 87  Resp: (!) 30  Temp: 98.3 F (36.8 C)  TempSrc: Oral  SpO2: 98%  Recheck RR 18 General appearance: alert; no distress; appears anxious HENT: normocephalic; atraumatic; oropharynx normal Neck: supple Lungs: clear to auscultation bilaterally Heart: regular rate and rhythm Abdomen: soft, non-tender; bowel sounds normal Skin: warm and dry Psychological: alert and cooperative   Allergies  Allergen Reactions  . Aspirin Other (See Comments)    Allergic to Adult Aspirin. Cant sleep.   [name] Hydroxyzine Other (See Comments)    insomnia  . Valium [Diazepam] Other (See Comments)    Insomnia, 05/10/16 pt states she took as young person- made her depressed    Past Medical History:  Diagnosis Date  . Anxiety   . Constipation   . Depression    "following husband's death"  . Hypertension   . Tinnitus     Social History   Social History  . Marital status: Widowed    Spouse name: N/A  . Number of children: 0  . Years of education: 88   Occupational History  . Not on file.   Social History Main Topics  . Smoking status: Never Smoker  . Smokeless tobacco: Never Used  . Alcohol use No  . Drug use: No  . Sexual activity: No   Other Topics Concern  . Not on file   Social History Narrative   Lives alone, husband passed away 2 1/2 years ago   Caffeine use- none   Family History  Problem Relation Age of Onset  . Diabetes Mother   . Hypertension Father    Past Surgical History:  Procedure Laterality Date  . ABDOMINAL HYSTERECTOMY    . fribroid surgery       Vanessa Kick, MD 08/14/17 [phone]

## 2017-08-18 ENCOUNTER — Ambulatory Visit (HOSPITAL_COMMUNITY)
Admission: EM | Admit: 2017-08-18 | Discharge: 2017-08-18 | Disposition: A | Discharge status: Home / Self Care | Payer: Medicare Other | Attending: Internal Medicine | Admitting: Internal Medicine

## 2017-08-18 ENCOUNTER — Encounter (HOSPITAL_COMMUNITY): Payer: Self-pay | Admitting: Emergency Medicine

## 2017-08-18 DIAGNOSIS — H9313 Tinnitus, bilateral: Secondary | ICD-10-CM

## 2017-08-18 DIAGNOSIS — S96911A Strain of unspecified muscle and tendon at ankle and foot level, right foot, initial encounter: Secondary | ICD-10-CM | POA: Diagnosis not present

## 2017-08-18 NOTE — ED Triage Notes (Signed)
Pt here for noise on right ear onset 2 years after husbands death  Reports she was given clonazepam before  Also c/o right ankle pain onset 2 weeks.... Hurts to walk on foot.   Denies inj/trauma  A&O x4... NAD... Ambulatory

## 2017-08-18 NOTE — Discharge Instructions (Signed)
Wear the ankle wrap for about one week. He may remove it periodically for bathing. Try placing ice on the side of the ankle that hurts. Limit the amount of walking that you to. Start taking the medication written on the piece of paper. Go to the drugstore tonight or tomorrow to pick this medicine up for the ringing in your ears.

## 2017-08-18 NOTE — ED Provider Notes (Signed)
Allison Franco    CSN: [id number] Arrival date & time: 08/18/17  1640     History   Chief Complaint Chief Complaint  Patient presents with  . Ear Problem    HPI Allison Franco is a 76 y.o. female.   76 year old female presents to the urgent care with complaints of tinnitus. She has had this for nearly 4 years. She has been to the emergency department several times for the same complaint, she was referred to ENT for which she is  was evaluated and treatment suggested lipoflavanoid. She did not get this filled. We spoke of this potential treatment for tinnitus and she stated she never taken it. She pulled out a paper from her purse and shows me the words lipo flavanoid.  She is also complaining of soreness to the right ankle. Denies any known injury. Soreness began about one week ago and it is sore with ambulation. A little better when elevated. She has been ambulatory for the entire time.       Past Medical History:  Diagnosis Date  . Anxiety   . Constipation   . Depression    "following husband's death"  . Hypertension   . Tinnitus     Patient Active Problem List   Diagnosis Date Noted  . Grief reaction 10/20/2013  . Hyponatremia 10/20/2013  . Obesity 10/20/2013  . Atypical chest pain 09/01/2013  . ALLERGIC RHINITIS 08/11/2009  . KNEE PAIN, BILATERAL 02/13/2009  . COUGH 02/13/2009  . INSOMNIA 07/17/2008  . TIREDNESS 04/16/2008  . HYPERTENSION 01/24/2008  . BACK PAIN, LUMBAR 01/22/2008  . HYPERGLYCEMIA, BORDERLINE 01/27/2007    Past Surgical History:  Procedure Laterality Date  . ABDOMINAL HYSTERECTOMY    . fribroid surgery      OB History    No data available       Home Medications    Prior to Admission medications   Medication Sig Start Date End Date Taking? Authorizing Provider  Calcium Carbonate-Vitamin D 600-400 MG-UNIT tablet Take 1 tablet by mouth daily.   Yes [provider]  lisinopril (PRINIVIL,ZESTRIL) 10 MG tablet Take 10  mg by mouth daily.    Yes [provider]  Melatonin 5 MG TABS Take 1 tablet by mouth every morning.   Yes [provider]  ranitidine (ZANTAC) 150 MG tablet Take 1 tablet (150 mg total) by mouth 2 (two) times daily. 08/10/17  Yes Vanessa Kick, MD  acetaminophen (TYLENOL) 325 MG tablet Take 325-650 mg by mouth every 6 (six) hours as needed for mild pain, moderate pain or headache. Reported on 05/10/2016    [provider]  methocarbamol (ROBAXIN) 500 MG tablet Take 1 tablet (500 mg total) by mouth 2 (two) times daily. 02/24/17   Lacretia Leigh, MD    Family History Family History  Problem Relation Age of Onset  . Diabetes Mother   . Hypertension Father     Social History Social History  Substance Use Topics  . Smoking status: Never Smoker  . Smokeless tobacco: Never Used  . Alcohol use No     Allergies   Aspirin; Hydroxyzine; and Valium [diazepam]   Review of Systems Review of Systems  Constitutional: Negative.   HENT: Negative for ear pain and facial swelling.        Chronic tinnitus for several years.  Respiratory: Negative.   Gastrointestinal: Negative.   Musculoskeletal:       Right ankle soreness as per history of present illness  Skin: Negative.  Psychiatric/Behavioral: Positive for sleep disturbance. The patient is nervous/anxious.   All other systems reviewed and are negative.    Physical Exam Triage Vital Signs ED Triage Vitals  Enc Vitals Group     BP 08/18/17 1800 (!) 114/56     Pulse Rate 08/18/17 1800 88     Resp 08/18/17 1800 20     Temp 08/18/17 1800 97.9 F (36.6 C)     Temp Source 08/18/17 1800 Oral     SpO2 08/18/17 1800 100 %     Weight --      Height --      Head Circumference --      Peak Flow --      Pain Score 08/18/17 1802 8     Pain Loc --      Pain Edu? --      Excl. in [place]? --    No data found.   Updated Vital Signs BP (!) 114/56 (BP Location: Left Arm)   Pulse 88   Temp 97.9 F (36.6 C) (Oral)    Resp 20   SpO2 100%   Visual Acuity Right Eye Distance:   Left Eye Distance:   Bilateral Distance:    Right Eye Near:   Left Eye Near:    Bilateral Near:     Physical Exam  Constitutional: She is oriented to person, place, and time. She appears well-developed and well-nourished. No distress.  HENT:  Head: Normocephalic and atraumatic.  Bilateral TMs are normal.  Neck: Normal range of motion. Neck supple.  Cardiovascular: Normal rate, regular rhythm and normal heart sounds.   Pulmonary/Chest: Effort normal and breath sounds normal.  Musculoskeletal: Normal range of motion. She exhibits no edema.  Right ankle with normal range of motion, normal strength. Minor tenderness to the medial aspect of the right ankle just posterior to the malleolus. No swelling, discoloration. Minor tenderness. No tenderness to the Achilles tendon or the foot.  Neurological: She is alert and oriented to person, place, and time.  Skin: Skin is warm. Capillary refill takes less than 2 seconds.  Psychiatric: She has a normal mood and affect.  Nursing note and vitals reviewed.    UC Treatments / Results  Labs (all labs ordered are listed, but only abnormal results are displayed) Labs Reviewed - No data to display  EKG  EKG Interpretation None       Radiology No results found.  Procedures Procedures (including critical care time)  Medications Ordered in UC Medications - No data to display   Initial Impression / Assessment and Plan / UC Course  I have reviewed the triage vital signs and the nursing notes.  Pertinent labs & imaging results that were available during my care of the patient were reviewed by me and considered in my medical decision making (see chart for details).    Wear the ankle wrap for about one week. He may remove it periodically for bathing. Try placing ice on the side of the ankle that hurts. Limit the amount of walking that you to. Start taking the medication written on  the piece of paper. Go to the drugstore tonight or tomorrow to pick this medicine up for the ringing in your ears.     Final Clinical Impressions(s) / UC Diagnoses   Final diagnoses:  Tinnitus aurium, bilateral  Right ankle strain, initial encounter    New Prescriptions New Prescriptions   No medications on file     Controlled Substance Prescriptions [place] Controlled Substance Registry  consulted? Not Applicable   Janne Napoleon, NP 08/18/17 1901

## 2017-08-25 DIAGNOSIS — G4700 Insomnia, unspecified: Secondary | ICD-10-CM | POA: Diagnosis not present

## 2017-08-25 DIAGNOSIS — H9311 Tinnitus, right ear: Secondary | ICD-10-CM | POA: Diagnosis not present

## 2017-08-30 ENCOUNTER — Ambulatory Visit: Payer: Self-pay | Admitting: *Deleted

## 2017-09-09 ENCOUNTER — Encounter (HOSPITAL_COMMUNITY): Payer: Self-pay | Admitting: Emergency Medicine

## 2017-09-09 ENCOUNTER — Emergency Department (HOSPITAL_COMMUNITY)
Admission: EM | Admit: 2017-09-09 | Discharge: 2017-09-10 | Disposition: A | Discharge status: Home / Self Care | Payer: Medicare Other | Attending: Emergency Medicine | Admitting: Emergency Medicine

## 2017-09-09 DIAGNOSIS — Z79899 Other long term (current) drug therapy: Secondary | ICD-10-CM | POA: Insufficient documentation

## 2017-09-09 DIAGNOSIS — R600 Localized edema: Secondary | ICD-10-CM | POA: Diagnosis present

## 2017-09-09 DIAGNOSIS — I10 Essential (primary) hypertension: Secondary | ICD-10-CM | POA: Diagnosis not present

## 2017-09-09 DIAGNOSIS — T783XXA Angioneurotic edema, initial encounter: Secondary | ICD-10-CM

## 2017-09-09 DIAGNOSIS — T782XXA Anaphylactic shock, unspecified, initial encounter: Secondary | ICD-10-CM | POA: Diagnosis not present

## 2017-09-09 MED ORDER — DIPHENHYDRAMINE HCL 25 MG PO CAPS
25.0000 mg | ORAL_CAPSULE | Freq: Once | ORAL | Status: AC
  Administered 2017-09-09: 25 mg via ORAL
  Filled 2017-09-09: qty 1

## 2017-09-09 MED ORDER — PREDNISONE 20 MG PO TABS
60.0000 mg | ORAL_TABLET | Freq: Once | ORAL | Status: AC
Start: 2017-09-09 — End: 2017-09-09
  Administered 2017-09-09: 60 mg via ORAL
  Filled 2017-09-09: qty 3

## 2017-09-09 MED ORDER — FAMOTIDINE 20 MG PO TABS
20.0000 mg | ORAL_TABLET | Freq: Once | ORAL | Status: AC
  Administered 2017-09-09: 20 mg via ORAL
  Filled 2017-09-09: qty 1

## 2017-09-09 NOTE — ED Triage Notes (Signed)
Pt states she started using a new tooth paste today and she started feeling tingling and swollen on her mouth, pt has swollen on her lips and she is on Lisinopril last taking this morning.

## 2017-09-09 NOTE — ED Provider Notes (Signed)
[facility] DEPT Provider Note   CSN: [id number] Arrival date & time: 09/09/17  2147     History   Chief Complaint Chief Complaint  Patient presents with  . Angioedema    HPI Trenyce Loera is a 76 y.o. female.  HPI Denee Boeder is a 76 y.o. female with hx of HTN, anxiety, presents to ED with complaint of lip swelling. Pt states her lower lip swelled up this morning when she woke up. States through out the day today, both of her lips swelled up and feel tingling and tight. She states she used a new tooth paste yesterday. No other new products. No other new medications. No new personal products. Pt is on lisinopril. No hx of the same.  Denies tongue, throat swelling. No SOB. No tx prior to coming in.   Past Medical History:  Diagnosis Date  . Anxiety   . Constipation   . Depression    "following husband's death"  . Hypertension   . Tinnitus     Patient Active Problem List   Diagnosis Date Noted  . Grief reaction 10/20/2013  . Hyponatremia 10/20/2013  . Obesity 10/20/2013  . Atypical chest pain 09/01/2013  . ALLERGIC RHINITIS 08/11/2009  . KNEE PAIN, BILATERAL 02/13/2009  . COUGH 02/13/2009  . INSOMNIA 07/17/2008  . TIREDNESS 04/16/2008  . HYPERTENSION 01/24/2008  . BACK PAIN, LUMBAR 01/22/2008  . HYPERGLYCEMIA, BORDERLINE 01/27/2007    Past Surgical History:  Procedure Laterality Date  . ABDOMINAL HYSTERECTOMY    . fribroid surgery      OB History    No data available       Home Medications    Prior to Admission medications   Medication Sig Start Date End Date Taking? Authorizing Provider  acetaminophen (TYLENOL) 325 MG tablet Take 325-650 mg by mouth every 6 (six) hours as needed for mild pain, moderate pain or headache. Reported on 05/10/2016    [provider]  Calcium Carbonate-Vitamin D 600-400 MG-UNIT tablet Take 1 tablet by mouth daily.    [provider]  lisinopril (PRINIVIL,ZESTRIL) 10 MG tablet Take 10 mg by mouth daily.      [provider]  Melatonin 5 MG TABS Take 1 tablet by mouth every morning.    [provider]  methocarbamol (ROBAXIN) 500 MG tablet Take 1 tablet (500 mg total) by mouth 2 (two) times daily. 02/24/17   Lacretia Leigh, MD  ranitidine (ZANTAC) 150 MG tablet Take 1 tablet (150 mg total) by mouth 2 (two) times daily. 08/10/17   Vanessa Kick, MD    Family History Family History  Problem Relation Age of Onset  . Diabetes Mother   . Hypertension Father     Social History Social History  Substance Use Topics  . Smoking status: Never Smoker  . Smokeless tobacco: Never Used  . Alcohol use No     Allergies   Aspirin; Hydroxyzine; and Valium [diazepam]   Review of Systems Review of Systems  Constitutional: Negative for chills and fever.  HENT: Positive for facial swelling.   Respiratory: Negative for cough, chest tightness and shortness of breath.   Cardiovascular: Negative for chest pain, palpitations and leg swelling.  Gastrointestinal: Negative for abdominal pain, diarrhea, nausea and vomiting.  Musculoskeletal: Negative for arthralgias, myalgias, neck pain and neck stiffness.  Skin: Negative for rash.  Neurological: Negative for dizziness, weakness and headaches.  All other systems reviewed and are negative.    Physical Exam Updated Vital Signs BP 134/66 (BP Location: Right  Arm)   Pulse 93   Temp 98 F (36.7 C) (Oral)   Resp 16   Ht 5\' 3"  (1.6 m)   Wt 84.4 kg (186 lb)   SpO2 99%   BMI 32.95 kg/m   Physical Exam  Constitutional: She appears well-developed and well-nourished. No distress.  HENT:  Head: Normocephalic.  Swelling to bilateral lips. Tongue, oropharynx, uvula otherwise normal.   Eyes: Conjunctivae are normal.  Neck: Neck supple.  Cardiovascular: Normal rate, regular rhythm and normal heart sounds.   Pulmonary/Chest: Effort normal and breath sounds normal. No respiratory distress. She has no wheezes. She has no rales.  No stridor.     Musculoskeletal: She exhibits no edema.  Neurological: She is alert.  Skin: Skin is warm and dry.  Psychiatric: She has a normal mood and affect. Her behavior is normal.  Nursing note and vitals reviewed.    ED Treatments / Results  Labs (all labs ordered are listed, but only abnormal results are displayed) Labs Reviewed - No data to display  EKG  EKG Interpretation None       Radiology No results found.  Procedures Procedures (including critical care time)  Medications Ordered in ED Medications  predniSONE (DELTASONE) tablet 60 mg (60 mg Oral Given 09/09/17 2300)  diphenhydrAMINE (BENADRYL) capsule 25 mg (25 mg Oral Given 09/09/17 2300)  famotidine (PEPCID) tablet 20 mg (20 mg Oral Given 09/09/17 2300)     Initial Impression / Assessment and Plan / ED Course  I have reviewed the triage vital signs and the nursing notes.  Pertinent labs & imaging results that were available during my care of the patient were reviewed by me and considered in my medical decision making (see chart for details).    Pt with swelling to lips, gradually worsening through out today. No hx of the same. No swelling to tongue, oropharynx, uvula. Possibly lisinopril angioedema vs from toothpaste. Will give prednisone, benadryl, pepcid. Will monitor. Discussed with Dr. Roderic Palau who has seen pt as well, agrees with plan. Will watch till 2am.    11:46 PM Pt started feeling short of breath. Still denies throat or tongue swelling. Lungs clear, no stridor. Pt appears somewhat anxious. Will get ECG, and place on monitor.   Signed out at shift change. If not worsening, OK to dc home around 2 am.     Final Clinical Impressions(s) / ED Diagnoses   Final diagnoses:  None    New Prescriptions New Prescriptions   No medications on file     Jeannett Senior, Hershal Coria 09/10/17 Rae Roam, MD 09/10/17 2128

## 2017-09-10 MED ORDER — DIPHENHYDRAMINE HCL 25 MG PO CAPS: 25.0000 mg | ORAL_CAPSULE | Freq: Four times a day (QID) | ORAL | 0 refills | Status: DC | PRN

## 2017-09-10 MED ORDER — EPINEPHRINE 0.3 MG/0.3ML IJ SOAJ: 0.3000 mg | Freq: Once | INTRAMUSCULAR | 1 refills | Status: DC | PRN

## 2017-09-10 MED ORDER — PREDNISONE 20 MG PO TABS: ORAL_TABLET | ORAL | 0 refills | Status: DC

## 2017-09-10 NOTE — Discharge Instructions (Signed)
Take the prescribed medication as directed.  If benadryl makes you sleepy can take it just at night. Use epi pen only if you feel SOB, trouble swallowing, throat swelling, etc.  If used, you must come to the ED. Follow-up with your primary care doctor to discuss your medications. Return to the ED for new or worsening symptoms.

## 2017-09-10 NOTE — ED Notes (Signed)
ED Provider at bedside. 

## 2017-09-10 NOTE — ED Provider Notes (Signed)
Assumed care of patient from [place] at shift change.  Briefly, 76 y.o. F here with angioedema.  Does take lisinopril but also used new toothpaste.  Swelling localized to lips only.  No SOB or trouble swallowing.  VSS.  Patient evaluated by Dr. Roderic Palau as well.  Given prednisone, benadryl, pepcid.    Plan:  Monitor til 2am.  If no acute changes, can d/c home with continued benadryl and prednisone taper.  Patient has already been instructed to stop her lisinopril and new toothpaste.  2:03 AM Patient reassessed.  States swelling in the lips is getting better.  cotinues to have some edema of the lips but no swelling of the tongue or oropharynx.  Handling secretions well.  Vitals stable.  Will d/c home with benadryl, prednisone taper.  Also given script for epipen.  She understands to stop the lisinopril and toothpaste.  Can follow-up with PCP and discuss medications since stopping the ACEI.  Discussed plan with patient, she acknowledged understanding and agreed with plan of care.  Return precautions given for new or worsening symptoms.   Larene Pickett, PA-C 09/10/17 [place], Delice Bison, DO 09/10/17 0174

## 2017-09-28 ENCOUNTER — Other Ambulatory Visit: Payer: Self-pay | Admitting: *Deleted

## 2017-09-28 DIAGNOSIS — G4700 Insomnia, unspecified: Secondary | ICD-10-CM | POA: Diagnosis not present

## 2017-09-28 DIAGNOSIS — I10 Essential (primary) hypertension: Secondary | ICD-10-CM | POA: Diagnosis not present

## 2017-09-28 DIAGNOSIS — Z0000 Encounter for general adult medical examination without abnormal findings: Secondary | ICD-10-CM | POA: Diagnosis not present

## 2017-09-28 DIAGNOSIS — Z1231 Encounter for screening mammogram for malignant neoplasm of breast: Secondary | ICD-10-CM | POA: Diagnosis not present

## 2017-09-28 DIAGNOSIS — E559 Vitamin D deficiency, unspecified: Secondary | ICD-10-CM | POA: Diagnosis not present

## 2017-09-28 NOTE — Patient Outreach (Signed)
[facility]) Care Management  09/28/2017  Eshika Reckart [birth year] [phone]  Referral for telephone screen received for patient due to 6 or more ED visits in the past 6 months.  Patient's primary physician, Dr. Audria Nine, not listed as part of the [facility].  Unable to contact patient to complete screening.    Plan: Will notify CMA of case closure status.   [place] [phone] [email or web address]

## 2017-10-15 ENCOUNTER — Encounter (HOSPITAL_COMMUNITY): Payer: Self-pay

## 2017-10-15 ENCOUNTER — Emergency Department (HOSPITAL_COMMUNITY)
Admission: EM | Admit: 2017-10-15 | Discharge: 2017-10-15 | Disposition: A | Discharge status: Home / Self Care | Payer: Medicare Other | Attending: Emergency Medicine | Admitting: Emergency Medicine

## 2017-10-15 ENCOUNTER — Emergency Department (HOSPITAL_COMMUNITY): Payer: Medicare Other

## 2017-10-15 DIAGNOSIS — R9431 Abnormal electrocardiogram [ECG] [EKG]: Secondary | ICD-10-CM | POA: Diagnosis not present

## 2017-10-15 DIAGNOSIS — Z5321 Procedure and treatment not carried out due to patient leaving prior to being seen by health care provider: Secondary | ICD-10-CM | POA: Diagnosis not present

## 2017-10-15 DIAGNOSIS — R0602 Shortness of breath: Secondary | ICD-10-CM | POA: Insufficient documentation

## 2017-10-15 LAB — I-STAT TROPONIN, ED: TROPONIN I, POC: 0.01 ng/mL (ref 0.00–0.08)

## 2017-10-15 LAB — CBC
HCT: 41.4 % (ref 36.0–46.0)
Hemoglobin: 13.1 g/dL (ref 12.0–15.0)
MCH: 28.1 pg (ref 26.0–34.0)
MCHC: 31.6 g/dL (ref 30.0–36.0)
MCV: 88.7 fL (ref 78.0–100.0)
PLATELETS: 255 10*3/uL (ref 150–400)
RBC: 4.67 MIL/uL (ref 3.87–5.11)
RDW: 13 % (ref 11.5–15.5)
WBC: 7.9 10*3/uL (ref 4.0–10.5)

## 2017-10-15 LAB — BASIC METABOLIC PANEL
Anion gap: 7 (ref 5–15)
BUN: 14 mg/dL (ref 6–20)
CALCIUM: 8.9 mg/dL (ref 8.9–10.3)
CO2: 27 mmol/L (ref 22–32)
CREATININE: 0.87 mg/dL (ref 0.44–1.00)
Chloride: 104 mmol/L (ref 101–111)
Glucose, Bld: 100 mg/dL — ABNORMAL HIGH (ref 65–99)
Potassium: 3.4 mmol/L — ABNORMAL LOW (ref 3.5–5.1)
SODIUM: 138 mmol/L (ref 135–145)

## 2017-10-15 NOTE — ED Triage Notes (Signed)
Per Pt, Pt is coming from home with complaints of SOB and leg swelling x 1 month. Pt reports that her MD took her off Lisinopril one months ago. Denies any chest pain.

## 2017-10-15 NOTE — ED Notes (Signed)
Pt states she does not want to wait, RN excouraged pt to wait.  Pt decided to leave, RN incouraged pt to come back if her symptoms should get worse.

## 2017-10-31 ENCOUNTER — Other Ambulatory Visit: Payer: Self-pay

## 2017-10-31 ENCOUNTER — Encounter (HOSPITAL_COMMUNITY): Payer: Self-pay | Admitting: Emergency Medicine

## 2017-10-31 ENCOUNTER — Ambulatory Visit (HOSPITAL_COMMUNITY)
Admission: EM | Admit: 2017-10-31 | Discharge: 2017-10-31 | Disposition: A | Discharge status: Home / Self Care | Payer: Medicare Other | Attending: Family Medicine | Admitting: Family Medicine

## 2017-10-31 DIAGNOSIS — F5102 Adjustment insomnia: Secondary | ICD-10-CM

## 2017-10-31 MED ORDER — ZOLPIDEM TARTRATE 5 MG PO TABS: 5.0000 mg | ORAL_TABLET | Freq: Every evening | ORAL | 0 refills | Status: DC | PRN

## 2017-10-31 MED ORDER — DIPHENHYDRAMINE HCL 25 MG PO CAPS: 25.0000 mg | ORAL_CAPSULE | Freq: Four times a day (QID) | ORAL | 0 refills | Status: DC | PRN

## 2017-10-31 NOTE — ED Provider Notes (Addendum)
[facility]   [id number] 10/31/17 Arrival Time: 7989   SUBJECTIVE:  Allison Franco is a 76 y.o. female who presents to the urgent care with complaint of sandwich from [employer], grilled chicken, 4 days ago.  After eating the sandwich she reports having tingling and itching all over her body that has lasted for the last 4 days.  She also reports a pain in her left temple that began when all this started.  Patient is a substitute Education officer, museum, originally from [place].  Patient had reaction to lisinopril two months ago.  Patient has had insomnia since symptoms began 2 days ago.  Past Medical History:  Diagnosis Date  . Anxiety   . Constipation   . Depression    "following husband's death"  . Hypertension   . Tinnitus    Family History  Problem Relation Age of Onset  . Diabetes Mother   . Hypertension Father    Social History   Socioeconomic History  . Marital status: Widowed    Spouse name: Not on file  . Number of children: 0  . Years of education: 76  . Highest education level: Not on file  Social Needs  . Financial resource strain: Not on file  . Food insecurity - worry: Not on file  . Food insecurity - inability: Not on file  . Transportation needs - medical: Not on file  . Transportation needs - non-medical: Not on file  Occupational History  . Not on file  Tobacco Use  . Smoking status: Never Smoker  . Smokeless tobacco: Never Used  Substance and Sexual Activity  . Alcohol use: No  . Drug use: No  . Sexual activity: No  Other Topics Concern  . Not on file  Social History Narrative   Lives alone, husband passed away 2 1/2 years ago   Caffeine use- none   Current Meds  Medication Sig  . Calcium Carbonate-Vitamin D 600-400 MG-UNIT tablet Take 1 tablet by mouth daily.  . Melatonin 5 MG TABS Take 1 tablet by mouth every morning.   Allergies  Allergen Reactions  . Aspirin Other (See Comments)    Allergic to Adult Aspirin. Cant sleep.     [name] Hydroxyzine Other (See Comments)    insomnia  . Valium [Diazepam] Other (See Comments)    Insomnia, 05/10/16 pt states she took as young person- made her depressed      ROS: As per HPI, remainder of ROS negative.   OBJECTIVE:   Vitals:   10/31/17 1259  BP: (!) 148/83  Pulse: 80  Temp: (!) 96.7 F (35.9 C)  TempSrc: Oral  SpO2: 94%     General appearance: alert; no distress Eyes: PERRL; EOMI; conjunctiva normal HENT: normocephalic; atraumatic; TMs normal, canal normal, external ears normal without trauma; nasal mucosa normal; oral mucosa normal Neck: supple Lungs: clear to auscultation bilaterally Heart: regular rate and rhythm Abdomen: soft, non-tender; bowel sounds normal; no masses or organomegaly; no guarding or rebound tenderness Back: no CVA tenderness Extremities: no cyanosis or edema; symmetrical with no gross deformities Skin: warm and dry Neurologic: normal gait; grossly normal Psychological: alert and cooperative; normal mood and affect   Labs:  Results for orders placed or performed during the hospital encounter of 21/19/41  Basic metabolic panel  Result Value Ref Range   Sodium 138 135 - 145 mmol/L   Potassium 3.4 (L) 3.5 - 5.1 mmol/L   Chloride 104 101 - 111 mmol/L   CO2 27 22 - 32  mmol/L   Glucose, Bld 100 (H) 65 - 99 mg/dL   BUN 14 6 - 20 mg/dL   Creatinine, Ser 0.87 0.44 - 1.00 mg/dL   Calcium 8.9 8.9 - 10.3 mg/dL   GFR calc non Af Amer >60 >60 mL/min   GFR calc Af Amer >60 >60 mL/min   Anion gap 7 5 - 15  CBC  Result Value Ref Range   WBC 7.9 4.0 - 10.5 K/uL   RBC 4.67 3.87 - 5.11 MIL/uL   Hemoglobin 13.1 12.0 - 15.0 g/dL   HCT 41.4 36.0 - 46.0 %   MCV 88.7 78.0 - 100.0 fL   MCH 28.1 26.0 - 34.0 pg   MCHC 31.6 30.0 - 36.0 g/dL   RDW 13.0 11.5 - 15.5 %   Platelets 255 150 - 400 K/uL  I-stat troponin, ED  Result Value Ref Range   Troponin i, poc 0.01 0.00 - 0.08 ng/mL   Comment 3            Labs Reviewed - No data to  display  No results found.     ASSESSMENT & PLAN:  1. Adjustment insomnia     Meds ordered this encounter  Medications  . diphenhydrAMINE (BENADRYL) 25 mg capsule    Sig: Take 1 capsule (25 mg total) by mouth every 6 (six) hours as needed.    Dispense:  30 capsule    Refill:  0  . zolpidem (AMBIEN) 5 MG tablet    Sig: Take 1 tablet (5 mg total) by mouth at bedtime as needed for sleep.    Dispense:  30 tablet    Refill:  0    Reviewed expectations re: course of current medical issues. Questions answered. Outlined signs and symptoms indicating need for more acute intervention. Patient verbalized understanding. After Visit Summary given.    Procedures:      Robyn Haber, MD 10/31/17 1335    Robyn Haber, MD 10/31/17 1336

## 2017-10-31 NOTE — ED Triage Notes (Signed)
Pt reports ordering a sandwich from [employer], grilled chicken, 4 days ago.  After eating the sandwich she reports having tingling and itching all over her body that has lasted for the last 4 days.  She also reports a pain in her left temple that began when all this started.

## 2017-11-01 DIAGNOSIS — H9311 Tinnitus, right ear: Secondary | ICD-10-CM | POA: Diagnosis not present

## 2017-11-01 DIAGNOSIS — H903 Sensorineural hearing loss, bilateral: Secondary | ICD-10-CM | POA: Diagnosis not present

## 2017-11-02 ENCOUNTER — Other Ambulatory Visit: Payer: Self-pay

## 2017-11-08 ENCOUNTER — Encounter (HOSPITAL_COMMUNITY): Payer: Self-pay | Admitting: *Deleted

## 2017-11-08 ENCOUNTER — Other Ambulatory Visit: Payer: Self-pay

## 2017-11-08 ENCOUNTER — Ambulatory Visit (HOSPITAL_COMMUNITY)
Admission: EM | Admit: 2017-11-08 | Discharge: 2017-11-08 | Disposition: A | Discharge status: Home / Self Care | Payer: Medicare Other | Attending: Family Medicine | Admitting: Family Medicine

## 2017-11-08 DIAGNOSIS — L299 Pruritus, unspecified: Secondary | ICD-10-CM

## 2017-11-08 LAB — POCT URINALYSIS DIP (DEVICE)
Bilirubin Urine: NEGATIVE
Glucose, UA: NEGATIVE mg/dL
HGB URINE DIPSTICK: NEGATIVE
KETONES UR: NEGATIVE mg/dL
Leukocytes, UA: NEGATIVE
Nitrite: NEGATIVE
PH: 5.5 (ref 5.0–8.0)
Protein, ur: NEGATIVE mg/dL
Urobilinogen, UA: 0.2 mg/dL (ref 0.0–1.0)

## 2017-11-08 LAB — GLUCOSE, CAPILLARY: GLUCOSE-CAPILLARY: 194 mg/dL — AB (ref 65–99)

## 2017-11-08 NOTE — ED Provider Notes (Signed)
[facility]    CSN: [id number] Arrival date & time: 11/08/17  1450     History   Chief Complaint Chief Complaint  Patient presents with  . Rash    HPI Allison Franco is a 76 y.o. female.   Krishika presents with complaints of sensation of "movements" throughout her skin. This has been ongoing for the past 2-3 weeks. She was seen here and started on benadryl and ambien, she states the benadryl has not helped. She has been taking it once a day. Without recent travel. Sensation is worse during the day. States she has been urinating more frequently, more frequently at night. Denies increased thirst. Denies abdominal pain, has been eating and drinking normally. She does have a PCP but has not seen them for this. Without skin rash.    ROS per HPI.       Past Medical History:  Diagnosis Date  . Anxiety   . Constipation   . Depression    "following husband's death"  . Hypertension   . Tinnitus     Patient Active Problem List   Diagnosis Date Noted  . Grief reaction 10/20/2013  . Hyponatremia 10/20/2013  . Obesity 10/20/2013  . Atypical chest pain 09/01/2013  . ALLERGIC RHINITIS 08/11/2009  . KNEE PAIN, BILATERAL 02/13/2009  . COUGH 02/13/2009  . INSOMNIA 07/17/2008  . TIREDNESS 04/16/2008  . HYPERTENSION 01/24/2008  . BACK PAIN, LUMBAR 01/22/2008  . HYPERGLYCEMIA, BORDERLINE 01/27/2007    Past Surgical History:  Procedure Laterality Date  . ABDOMINAL HYSTERECTOMY    . fribroid surgery      OB History    No data available       Home Medications    Prior to Admission medications   Medication Sig Start Date End Date Taking? Authorizing Provider  acetaminophen (TYLENOL) 325 MG tablet Take 325-650 mg by mouth every 6 (six) hours as needed for mild pain, moderate pain or headache. Reported on 05/10/2016    [provider]  Calcium Carbonate-Vitamin D 600-400 MG-UNIT tablet Take 1 tablet by mouth daily.    [provider]    diphenhydrAMINE (BENADRYL) 25 mg capsule Take 1 capsule (25 mg total) by mouth every 6 (six) hours as needed. 10/31/17   Robyn Haber, MD  EPINEPHrine (EPIPEN 2-PAK) 0.3 mg/0.3 mL IJ SOAJ injection Inject 0.3 mLs (0.3 mg total) into the muscle once as needed (for severe allergic reaction). CAll 911 immediately if you have to use this medicine 09/10/17   Larene Pickett, PA-C  Melatonin 5 MG TABS Take 1 tablet by mouth every morning.    [provider]  methocarbamol (ROBAXIN) 500 MG tablet Take 1 tablet (500 mg total) by mouth 2 (two) times daily. 02/24/17   Lacretia Leigh, MD  zolpidem (AMBIEN) 5 MG tablet Take 1 tablet (5 mg total) by mouth at bedtime as needed for sleep. 10/31/17   Robyn Haber, MD    Family History Family History  Problem Relation Age of Onset  . Diabetes Mother   . Hypertension Father     Social History Social History   Tobacco Use  . Smoking status: Never Smoker  . Smokeless tobacco: Never Used  Substance Use Topics  . Alcohol use: No  . Drug use: No     Allergies   Aspirin; Hydroxyzine; and Valium [diazepam]   Review of Systems Review of Systems   Physical Exam Triage Vital Signs ED Triage Vitals [11/08/17 1612]  Enc Vitals Group  BP (!) 145/71     Pulse Rate 88     Resp      Temp 98.1 F (36.7 C)     Temp Source Oral     SpO2 100 %     Weight      Height      Head Circumference      Peak Flow      Pain Score      Pain Loc      Pain Edu?      Excl. in [place]?    No data found.  Updated Vital Signs BP (!) 145/71 (BP Location: Left Arm)   Pulse 88   Temp 98.1 F (36.7 C) (Oral)   SpO2 100%   Visual Acuity Right Eye Distance:   Left Eye Distance:   Bilateral Distance:    Right Eye Near:   Left Eye Near:    Bilateral Near:     Physical Exam  Constitutional: She is oriented to person, place, and time. She appears well-developed and well-nourished. No distress.  HENT:  Head: Normocephalic and atraumatic.   Eyes: Pupils are equal, round, and reactive to light.  Cardiovascular: Normal rate, regular rhythm and normal heart sounds.  Pulmonary/Chest: Effort normal and breath sounds normal.  Abdominal: Soft. There is no tenderness.  Neurological: She is alert and oriented to person, place, and time.  Skin: Skin is warm and dry. No rash noted. Rash is not urticarial.     UC Treatments / Results  Labs (all labs ordered are listed, but only abnormal results are displayed) Labs Reviewed  GLUCOSE, CAPILLARY - Abnormal; Notable for the following components:      Result Value   Glucose-Capillary 194 (*)    All other components within normal limits  POCT URINALYSIS DIP (DEVICE)    EKG  EKG Interpretation None       Radiology No results found.  Procedures Procedures (including critical care time)  Medications Ordered in UC Medications - No data to display   Initial Impression / Assessment and Plan / UC Course  I have reviewed the triage vital signs and the nursing notes.  Pertinent labs & imaging results that were available during my care of the patient were reviewed by me and considered in my medical decision making (see chart for details).     Without rash present. Concern for hyperglycemia as possible cause of itching as also with urinary frequency. BS in clinic 194, had recently eaten. Urine without sugar present. Recommended to continue to follow with PCP as A1c may be of benefit. Patient states that she feels better already, that she feels her symptoms may be related to feeling anxious and hearing that she appears well improves her symptoms. Return precautions provided.  Ambulatory out of clinic without difficulty.    Final Clinical Impressions(s) / UC Diagnoses   Final diagnoses:  Itching    ED Discharge Orders    None       Controlled Substance Prescriptions [place] Controlled Substance Registry consulted? Not Applicable   Zigmund Gottron, NP 11/08/17 1728

## 2017-11-08 NOTE — Discharge Instructions (Signed)
Your blood sugar is elevated today although you have not been fasting.  Your urine is normal. I recommend following up with your primary care doctor for further evaluation as additional blood testing may be helpful to rule out other causes of your skin sensation.

## 2017-11-08 NOTE — ED Triage Notes (Signed)
Per pt she feels itchy, per pt she was taking meds for allergies and she is taking meds, per pt she still feels some "movements" in her skin and is scared and she's back to recheck it.

## 2017-12-01 ENCOUNTER — Ambulatory Visit (HOSPITAL_COMMUNITY)
Admission: EM | Admit: 2017-12-01 | Discharge: 2017-12-01 | Disposition: A | Discharge status: Home / Self Care | Payer: Medicare Other | Attending: Family Medicine | Admitting: Family Medicine

## 2017-12-01 ENCOUNTER — Encounter (HOSPITAL_COMMUNITY): Payer: Self-pay | Admitting: *Deleted

## 2017-12-01 DIAGNOSIS — B349 Viral infection, unspecified: Secondary | ICD-10-CM | POA: Diagnosis not present

## 2017-12-01 MED ORDER — FLUTICASONE PROPIONATE 50 MCG/ACT NA SUSP: 2.0000 | Freq: Every day | NASAL | 0 refills | Status: DC

## 2017-12-01 MED ORDER — DIPHENHYDRAMINE HCL 25 MG PO CAPS: 25.0000 mg | ORAL_CAPSULE | Freq: Every evening | ORAL | 0 refills | Status: DC | PRN

## 2017-12-01 MED ORDER — BENZONATATE 100 MG PO CAPS: 100.0000 mg | ORAL_CAPSULE | Freq: Three times a day (TID) | ORAL | 0 refills | Status: DC

## 2017-12-01 NOTE — ED Provider Notes (Signed)
[place]    CSN: [id number] Arrival date & time: 12/01/17  1008     History   Chief Complaint Chief Complaint  Patient presents with  . Cough    HPI Allison Franco is a 77 y.o. female.   77 year old female comes in for 2-day history of URI symptoms.  She has had sneezing, cough, nasal congestion, rhinorrhea.  Denies fever, chills, night sweats.  States the  nasal congestion is making her feel short of breath because she cannot breathe through her nose.  Has not tried anything for the symptoms.  Denies sick contact.  Never smoker.      Past Medical History:  Diagnosis Date  . Anxiety   . Constipation   . Depression    "following husband's death"  . Hypertension   . Tinnitus     Patient Active Problem List   Diagnosis Date Noted  . Grief reaction 10/20/2013  . Hyponatremia 10/20/2013  . Obesity 10/20/2013  . Atypical chest pain 09/01/2013  . ALLERGIC RHINITIS 08/11/2009  . KNEE PAIN, BILATERAL 02/13/2009  . COUGH 02/13/2009  . INSOMNIA 07/17/2008  . TIREDNESS 04/16/2008  . HYPERTENSION 01/24/2008  . BACK PAIN, LUMBAR 01/22/2008  . HYPERGLYCEMIA, BORDERLINE 01/27/2007    Past Surgical History:  Procedure Laterality Date  . ABDOMINAL HYSTERECTOMY    . fribroid surgery      OB History    No data available       Home Medications    Prior to Admission medications   Medication Sig Start Date End Date Taking? Authorizing Provider  lisinopril (PRINIVIL,ZESTRIL) 10 MG tablet Take 10 mg by mouth daily.   Yes [provider]  acetaminophen (TYLENOL) 325 MG tablet Take 325-650 mg by mouth every 6 (six) hours as needed for mild pain, moderate pain or headache. Reported on 05/10/2016    [provider]  benzonatate (TESSALON) 100 MG capsule Take 1 capsule (100 mg total) by mouth every 8 (eight) hours. 12/01/17   Tasia Catchings,  V, PA-C  Calcium Carbonate-Vitamin D 600-400 MG-UNIT tablet Take 1 tablet by mouth daily.    [provider]    diphenhydrAMINE (BENADRYL) 25 mg capsule Take 1 capsule (25 mg total) by mouth at bedtime as needed for allergies. 12/01/17   Tasia Catchings,  V, PA-C  EPINEPHrine (EPIPEN 2-PAK) 0.3 mg/0.3 mL IJ SOAJ injection Inject 0.3 mLs (0.3 mg total) into the muscle once as needed (for severe allergic reaction). CAll 911 immediately if you have to use this medicine 09/10/17   Larene Pickett, PA-C  fluticasone [facility]) 50 MCG/ACT nasal spray Place 2 sprays into both nostrils daily. 12/01/17   Ok Edwards, PA-C  Melatonin 5 MG TABS Take 1 tablet by mouth every morning.    [provider]  zolpidem (AMBIEN) 5 MG tablet Take 1 tablet (5 mg total) by mouth at bedtime as needed for sleep. 10/31/17   Robyn Haber, MD    Family History Family History  Problem Relation Age of Onset  . Diabetes Mother   . Hypertension Father     Social History Social History   Tobacco Use  . Smoking status: Never Smoker  . Smokeless tobacco: Never Used  Substance Use Topics  . Alcohol use: No  . Drug use: No     Allergies   Aspirin; Hydroxyzine; and Valium [diazepam]   Review of Systems Review of Systems  Reason unable to perform ROS: See HPI as above.     Physical Exam Triage Vital  Signs ED Triage Vitals [12/01/17 1054]  Enc Vitals Group     BP 139/75     Pulse Rate 89     Resp 18     Temp 98.1 F (36.7 C)     Temp Source Oral     SpO2 98 %     Weight      Height      Head Circumference      Peak Flow      Pain Score      Pain Loc      Pain Edu?      Excl. in [place]?    No data found.  Updated Vital Signs BP 139/75 (BP Location: Right Arm)   Pulse 89   Temp 98.1 F (36.7 C) (Oral)   Resp 18   SpO2 98%   Physical Exam  Constitutional: She is oriented to person, place, and time. She appears well-developed and well-nourished. No distress.  HENT:  Head: Normocephalic and atraumatic.  Right Ear: External ear and ear canal normal. Tympanic membrane is erythematous. Tympanic membrane is not  bulging.  Left Ear: External ear and ear canal normal. Tympanic membrane is erythematous. Tympanic membrane is not bulging.  Nose: Mucosal edema and rhinorrhea present. Right sinus exhibits no maxillary sinus tenderness and no frontal sinus tenderness. Left sinus exhibits no maxillary sinus tenderness and no frontal sinus tenderness.  Mouth/Throat: Uvula is midline, oropharynx is clear and moist and mucous membranes are normal.  Eyes: Conjunctivae are normal. Pupils are equal, round, and reactive to light.  Neck: Normal range of motion. Neck supple.  Cardiovascular: Normal rate, regular rhythm and normal heart sounds. Exam reveals no gallop and no friction rub.  No murmur heard. Pulmonary/Chest: Effort normal and breath sounds normal. She has no decreased breath sounds. She has no wheezes. She has no rhonchi. She has no rales.  Lymphadenopathy:    She has no cervical adenopathy.  Neurological: She is alert and oriented to person, place, and time.  Skin: Skin is warm and dry.  Psychiatric: She has a normal mood and affect. Her behavior is normal. Judgment normal.     UC Treatments / Results  Labs (all labs ordered are listed, but only abnormal results are displayed) Labs Reviewed - No data to display  EKG  EKG Interpretation None       Radiology No results found.  Procedures Procedures (including critical care time)  Medications Ordered in UC Medications - No data to display   Initial Impression / Assessment and Plan / UC Course  I have reviewed the triage vital signs and the nursing notes.  Pertinent labs & imaging results that were available during my care of the patient were reviewed by me and considered in my medical decision making (see chart for details).    Discussed with patient history and exam most consistent with viral URI. Symptomatic treatment as needed. Push fluids. Return precautions given.   Final Clinical Impressions(s) / UC Diagnoses   Final  diagnoses:  Viral illness    ED Discharge Orders        Ordered    fluticasone (FLONASE) 50 MCG/ACT nasal spray  Daily     12/01/17 1149    benzonatate (TESSALON) 100 MG capsule  Every 8 hours     12/01/17 1149    diphenhydrAMINE (BENADRYL) 25 mg capsule  At bedtime PRN     12/01/17 1149        ,  V, PA-C 12/01/17 1153

## 2017-12-01 NOTE — ED Triage Notes (Signed)
Patient states that yesterday while riding the bus she started sneezing and had a cough. Reports nasal congestion which is making her sob. No fevers.

## 2017-12-01 NOTE — Discharge Instructions (Signed)
Tessalon for cough. Start flonase, benadryk for nasal congestion. You can use over the counter nasal saline rinse such as neti pot for nasal congestion. Keep hydrated, your urine should be clear to pale yellow in color. Tylenol/motrin for fever and pain. Monitor for any worsening of symptoms, chest pain, shortness of breath, wheezing, swelling of the throat, follow up for reevaluation.   For sore throat try using a honey-based tea. Use 3 teaspoons of honey with juice squeezed from half lemon. Place shaved pieces of ginger into 1/2-1 cup of water and warm over stove top. Then mix the ingredients and repeat every 4 hours as needed.

## 2017-12-13 DIAGNOSIS — H9311 Tinnitus, right ear: Secondary | ICD-10-CM | POA: Diagnosis not present

## 2017-12-13 DIAGNOSIS — J309 Allergic rhinitis, unspecified: Secondary | ICD-10-CM | POA: Diagnosis not present

## 2018-01-05 ENCOUNTER — Encounter (HOSPITAL_COMMUNITY): Payer: Self-pay | Admitting: Emergency Medicine

## 2018-01-05 ENCOUNTER — Other Ambulatory Visit: Payer: Self-pay

## 2018-01-05 ENCOUNTER — Ambulatory Visit (HOSPITAL_COMMUNITY)
Admission: EM | Admit: 2018-01-05 | Discharge: 2018-01-05 | Disposition: A | Discharge status: Home / Self Care | Payer: Medicare Other | Attending: Internal Medicine | Admitting: Internal Medicine

## 2018-01-05 DIAGNOSIS — B349 Viral infection, unspecified: Secondary | ICD-10-CM

## 2018-01-05 DIAGNOSIS — M7750 Other enthesopathy of unspecified foot: Secondary | ICD-10-CM

## 2018-01-05 MED ORDER — IPRATROPIUM BROMIDE 0.06 % NA SOLN: 2.0000 | Freq: Four times a day (QID) | NASAL | 0 refills | Status: DC

## 2018-01-05 MED ORDER — BENZONATATE 100 MG PO CAPS: 100.0000 mg | ORAL_CAPSULE | Freq: Three times a day (TID) | ORAL | 0 refills | Status: DC

## 2018-01-05 MED ORDER — FLUTICASONE PROPIONATE 50 MCG/ACT NA SUSP: 2.0000 | Freq: Every day | NASAL | 0 refills | Status: DC

## 2018-01-05 MED ORDER — MELOXICAM 7.5 MG PO TABS: 7.5000 mg | ORAL_TABLET | Freq: Every day | ORAL | 0 refills | Status: DC

## 2018-01-05 NOTE — Discharge Instructions (Signed)
Tessalon for cough. Start flonase, atrovent nasal spray for nasal congestion/drainage. You can use over the counter nasal saline rinse such as neti pot for nasal congestion. Keep hydrated, your urine should be clear to pale yellow in color. Tylenol/motrin for fever and pain. Monitor for any worsening of symptoms, chest pain, shortness of breath, wheezing, swelling of the throat, follow up for reevaluation.   Mobic to help with pain and inflammation. If notice any allergies to medicine, to stop medicines and contact PCP.

## 2018-01-05 NOTE — ED Provider Notes (Signed)
[place]    CSN: [id number] Arrival date & time: 01/05/18  1353     History   Chief Complaint Chief Complaint  Patient presents with  . Cough  . Foot Pain    HPI Allison Franco is a 77 y.o. female.   77 year old female comes in for multiple complaints.  3 day history of URI symptoms. Productive cough, rhinorrhea, nasal congestion, sneezing. States chest soreness when coughing. Denies shortness of breath, wheezing. Denies fever, chills, night sweats. otc tylenol without relief. Never smoker. Positive sick contact.  1 month history of right heel pain. States no pain when not weight bearing, but will start with walking. Have been doing epsom salt soaks. States long hours of walking and standing. Denies numbness/tingling.       Past Medical History:  Diagnosis Date  . Anxiety   . Constipation   . Depression    "following husband's death"  . Hypertension   . Tinnitus     Patient Active Problem List   Diagnosis Date Noted  . Grief reaction 10/20/2013  . Hyponatremia 10/20/2013  . Obesity 10/20/2013  . Atypical chest pain 09/01/2013  . ALLERGIC RHINITIS 08/11/2009  . KNEE PAIN, BILATERAL 02/13/2009  . COUGH 02/13/2009  . INSOMNIA 07/17/2008  . TIREDNESS 04/16/2008  . HYPERTENSION 01/24/2008  . BACK PAIN, LUMBAR 01/22/2008  . HYPERGLYCEMIA, BORDERLINE 01/27/2007    Past Surgical History:  Procedure Laterality Date  . ABDOMINAL HYSTERECTOMY    . fribroid surgery      OB History    No data available       Home Medications    Prior to Admission medications   Medication Sig Start Date End Date Taking? Authorizing Provider  acetaminophen (TYLENOL) 325 MG tablet Take 325-650 mg by mouth every 6 (six) hours as needed for mild pain, moderate pain or headache. Reported on 05/10/2016    [provider]  benzonatate (TESSALON) 100 MG capsule Take 1 capsule (100 mg total) by mouth every 8 (eight) hours. 01/05/18   Tasia Catchings, Betsaida [place] V, PA-C  Calcium  Carbonate-Vitamin D 600-400 MG-UNIT tablet Take 1 tablet by mouth daily.    [provider]  diphenhydrAMINE (BENADRYL) 25 mg capsule Take 1 capsule (25 mg total) by mouth at bedtime as needed for allergies. 12/01/17   Tasia Catchings, Adah Stoneberg V, PA-C  EPINEPHrine (EPIPEN 2-PAK) 0.3 mg/0.3 mL IJ SOAJ injection Inject 0.3 mLs (0.3 mg total) into the muscle once as needed (for severe allergic reaction). CAll 911 immediately if you have to use this medicine 09/10/17   Larene Pickett, PA-C  fluticasone [facility]) 50 MCG/ACT nasal spray Place 2 sprays into both nostrils daily. 01/05/18   Tasia Catchings, Querida Beretta V, PA-C  ipratropium (ATROVENT) 0.06 % nasal spray Place 2 sprays into both nostrils 4 (four) times daily. 01/05/18   Tasia Catchings, Lillyonna Armstead V, PA-C  lisinopril (PRINIVIL,ZESTRIL) 10 MG tablet Take 10 mg by mouth daily.    [provider]  Melatonin 5 MG TABS Take 1 tablet by mouth every morning.    [provider]  meloxicam (MOBIC) 7.5 MG tablet Take 1 tablet (7.5 mg total) by mouth daily. 01/05/18   Tasia Catchings, Kiely Cousar V, PA-C  zolpidem (AMBIEN) 5 MG tablet Take 1 tablet (5 mg total) by mouth at bedtime as needed for sleep. 10/31/17   Robyn Haber, MD    Family History Family History  Problem Relation Age of Onset  . Diabetes Mother   . Hypertension Father     Social  History Social History   Tobacco Use  . Smoking status: Never Smoker  . Smokeless tobacco: Never Used  Substance Use Topics  . Alcohol use: No  . Drug use: No     Allergies   Aspirin; Hydroxyzine; and Valium [diazepam]   Review of Systems Review of Systems  Reason unable to perform ROS: See HPI as above.     Physical Exam Triage Vital Signs ED Triage Vitals  Enc Vitals Group     BP 01/05/18 1439 (!) 142/75     Pulse Rate 01/05/18 1439 88     Resp 01/05/18 1439 18     Temp 01/05/18 1439 97.7 F (36.5 C)     Temp Source 01/05/18 1439 Oral     SpO2 01/05/18 1439 99 %     Weight --      Height --      Head Circumference --      Peak  Flow --      Pain Score 01/05/18 1438 7     Pain Loc --      Pain Edu? --      Excl. in [place]? --    No data found.  Updated Vital Signs BP (!) 142/75 (BP Location: Left Arm)   Pulse 88   Temp 97.7 F (36.5 C) (Oral)   Resp 18   SpO2 99%   Physical Exam  Constitutional: She is oriented to person, place, and time. She appears well-developed and well-nourished. No distress.  HENT:  Head: Normocephalic and atraumatic.  Right Ear: Tympanic membrane, external ear and ear canal normal. Tympanic membrane is not erythematous and not bulging.  Left Ear: Tympanic membrane, external ear and ear canal normal. Tympanic membrane is not erythematous and not bulging.  Nose: Nose normal. Right sinus exhibits no maxillary sinus tenderness and no frontal sinus tenderness. Left sinus exhibits no maxillary sinus tenderness and no frontal sinus tenderness.  Mouth/Throat: Uvula is midline, oropharynx is clear and moist and mucous membranes are normal.  Eyes: Conjunctivae are normal. Pupils are equal, round, and reactive to light.  Neck: Normal range of motion. Neck supple.  Cardiovascular: Normal rate, regular rhythm and normal heart sounds. Exam reveals no gallop and no friction rub.  No murmur heard. Pulmonary/Chest: Effort normal and breath sounds normal. She has no decreased breath sounds. She has no wheezes. She has no rhonchi. She has no rales. She exhibits tenderness (right).  Musculoskeletal:  No swelling, erythema, increased warmth.  Tenderness palpation of posterior ankle, around the Achilles tendon.  No tenderness on palpation of bilateral malleolus.  No tenderness on palpation of the foot.  Full range of motion of ankle.  Strength normal and equal bilaterally.  Sensation intact ankle bilaterally.  Pedal pulses 2+ and equal bilaterally.  Lymphadenopathy:    She has no cervical adenopathy.  Neurological: She is alert and oriented to person, place, and time.  Skin: Skin is warm and dry.    Psychiatric: She has a normal mood and affect. Her behavior is normal. Judgment normal.    UC Treatments / Results  Labs (all labs ordered are listed, but only abnormal results are displayed) Labs Reviewed - No data to display  EKG  EKG Interpretation None       Radiology No results found.  Procedures Procedures (including critical care time)  Medications Ordered in UC Medications - No data to display   Initial Impression / Assessment and Plan / UC Course  I have reviewed the triage vital  signs and the nursing notes.  Pertinent labs & imaging results that were available during my care of the patient were reviewed by me and considered in my medical decision making (see chart for details).    Discussed with patient history and exam most consistent with viral URI. Symptomatic treatment as needed. Push fluids. Return precautions given.   Discussed right foot pain most likely due to tendinitis.  Start Mobic as directed.  Patient states allergy to aspirin, which can make her not be able to sleep.  She denies any hives, shortness of breath, swelling of the throat with aspirin use.  Willing to try Mobic.  Discussed symptoms of allergic reaction, patient to monitor closely.  Return precautions given.  Patient expresses understanding and agrees to plan.  Final Clinical Impressions(s) / UC Diagnoses   Final diagnoses:  Viral illness  Tendinitis of ankle    ED Discharge Orders        Ordered    benzonatate (TESSALON) 100 MG capsule  Every 8 hours     01/05/18 1542    fluticasone (FLONASE) 50 MCG/ACT nasal spray  Daily     01/05/18 1542    ipratropium (ATROVENT) 0.06 % nasal spray  4 times daily     01/05/18 1542    meloxicam (MOBIC) 7.5 MG tablet  Daily     01/05/18 1542        Ok Edwards, PA-C 01/05/18 1555

## 2018-01-05 NOTE — ED Triage Notes (Signed)
The patient presented to the [facility] with a complaint of a cough and congestion x 3 days.  The patient also complained of right heel pain x 1 month. The patient denied any known injury.

## 2018-02-05 ENCOUNTER — Other Ambulatory Visit: Payer: Self-pay

## 2018-02-05 ENCOUNTER — Emergency Department (HOSPITAL_COMMUNITY)
Admission: EM | Admit: 2018-02-05 | Discharge: 2018-02-05 | Disposition: A | Discharge status: Home / Self Care | Payer: Medicare Other | Attending: Emergency Medicine | Admitting: Emergency Medicine

## 2018-02-05 ENCOUNTER — Encounter (HOSPITAL_COMMUNITY): Payer: Self-pay | Admitting: Emergency Medicine

## 2018-02-05 DIAGNOSIS — I10 Essential (primary) hypertension: Secondary | ICD-10-CM | POA: Diagnosis not present

## 2018-02-05 DIAGNOSIS — Z79899 Other long term (current) drug therapy: Secondary | ICD-10-CM | POA: Diagnosis not present

## 2018-02-05 DIAGNOSIS — H9313 Tinnitus, bilateral: Secondary | ICD-10-CM | POA: Diagnosis not present

## 2018-02-05 DIAGNOSIS — R030 Elevated blood-pressure reading, without diagnosis of hypertension: Secondary | ICD-10-CM | POA: Diagnosis not present

## 2018-02-05 DIAGNOSIS — H9319 Tinnitus, unspecified ear: Secondary | ICD-10-CM | POA: Insufficient documentation

## 2018-02-05 MED ORDER — ALPRAZOLAM 0.25 MG PO TABS: 0.2500 mg | ORAL_TABLET | Freq: Every evening | ORAL | 0 refills | Status: DC | PRN

## 2018-02-05 NOTE — ED Provider Notes (Addendum)
[facility] EMERGENCY DEPARTMENT Provider Note   CSN: [id number] Arrival date & time: 02/05/18  0016     History   Chief Complaint Chief Complaint  Patient presents with  . Tinnitus    HPI Allison Franco is a 77 y.o. female.  Patient presents to the emergency department for evaluation of ringing in her ears.  She reports that she has had this for more than 2 years.  She has seen specialists but they have not been able to help her.  Tonight the ringing got louder and she was unable to sleep.  She thinks this made her blood pressure go up.  No headache, blurred vision, chest pain, shortness of breath.  She has not had any decreased hearing or ear pain.  No vertigo.      Past Medical History:  Diagnosis Date  . Anxiety   . Constipation   . Depression    "following husband's death"  . Hypertension   . Tinnitus     Patient Active Problem List   Diagnosis Date Noted  . Grief reaction 10/20/2013  . Hyponatremia 10/20/2013  . Obesity 10/20/2013  . Atypical chest pain 09/01/2013  . ALLERGIC RHINITIS 08/11/2009  . KNEE PAIN, BILATERAL 02/13/2009  . COUGH 02/13/2009  . INSOMNIA 07/17/2008  . TIREDNESS 04/16/2008  . HYPERTENSION 01/24/2008  . BACK PAIN, LUMBAR 01/22/2008  . HYPERGLYCEMIA, BORDERLINE 01/27/2007    Past Surgical History:  Procedure Laterality Date  . ABDOMINAL HYSTERECTOMY    . fribroid surgery      OB History    No data available       Home Medications    Prior to Admission medications   Medication Sig Start Date End Date Taking? Authorizing Provider  acetaminophen (TYLENOL) 325 MG tablet Take 325-650 mg by mouth every 6 (six) hours as needed for mild pain, moderate pain or headache. Reported on 05/10/2016    [provider]  ALPRAZolam Duanne Moron) 0.25 MG tablet Take 1 tablet (0.25 mg total) by mouth at bedtime as needed for anxiety (or ringing in ear). 02/05/18   Orpah Greek, MD  benzonatate (TESSALON) 100 MG  capsule Take 1 capsule (100 mg total) by mouth every 8 (eight) hours. 01/05/18   Tasia Catchings, Amy V, PA-C  Calcium Carbonate-Vitamin D 600-400 MG-UNIT tablet Take 1 tablet by mouth daily.    [provider]  diphenhydrAMINE (BENADRYL) 25 mg capsule Take 1 capsule (25 mg total) by mouth at bedtime as needed for allergies. 12/01/17   Tasia Catchings, Amy V, PA-C  EPINEPHrine (EPIPEN 2-PAK) 0.3 mg/0.3 mL IJ SOAJ injection Inject 0.3 mLs (0.3 mg total) into the muscle once as needed (for severe allergic reaction). CAll 911 immediately if you have to use this medicine 09/10/17   Larene Pickett, PA-C  fluticasone [facility]) 50 MCG/ACT nasal spray Place 2 sprays into both nostrils daily. 01/05/18   Tasia Catchings, Amy V, PA-C  ipratropium (ATROVENT) 0.06 % nasal spray Place 2 sprays into both nostrils 4 (four) times daily. 01/05/18   Tasia Catchings, Amy V, PA-C  lisinopril (PRINIVIL,ZESTRIL) 10 MG tablet Take 10 mg by mouth daily.    [provider]  Melatonin 5 MG TABS Take 1 tablet by mouth every morning.    [provider]  meloxicam (MOBIC) 7.5 MG tablet Take 1 tablet (7.5 mg total) by mouth daily. 01/05/18   Tasia Catchings, Amy V, PA-C  zolpidem (AMBIEN) 5 MG tablet Take 1 tablet (5 mg total) by mouth at bedtime as needed for  sleep. 10/31/17   Robyn Haber, MD    Family History Family History  Problem Relation Age of Onset  . Diabetes Mother   . Hypertension Father     Social History Social History   Tobacco Use  . Smoking status: Never Smoker  . Smokeless tobacco: Never Used  Substance Use Topics  . Alcohol use: No  . Drug use: No     Allergies   Aspirin; Hydroxyzine; and Valium [diazepam]   Review of Systems Review of Systems  HENT: Positive for tinnitus.   All other systems reviewed and are negative.    Physical Exam Updated Vital Signs BP (!) 150/85 (BP Location: Right Arm)   Pulse 88   Temp 98.2 F (36.8 C) (Oral)   Resp 18   Ht 5\' 3"  (1.6 m)   Wt 83.9 kg (185 lb)   SpO2 100%   BMI 32.77 kg/m    Physical Exam  Constitutional: She is oriented to person, place, and time. She appears well-developed and well-nourished. No distress.  HENT:  Head: Normocephalic and atraumatic.  Right Ear: Hearing, tympanic membrane and ear canal normal.  Left Ear: Hearing, tympanic membrane and ear canal normal.  Nose: Nose normal.  Mouth/Throat: Oropharynx is clear and moist and mucous membranes are normal.  Eyes: Conjunctivae and EOM are normal. Pupils are equal, round, and reactive to light.  Neck: Normal range of motion. Neck supple.  Cardiovascular: Regular rhythm, S1 normal and S2 normal. Exam reveals no gallop and no friction rub.  No murmur heard. Pulmonary/Chest: Effort normal and breath sounds normal. No respiratory distress. She exhibits no tenderness.  Abdominal: Soft. Normal appearance and bowel sounds are normal. There is no hepatosplenomegaly. There is no tenderness. There is no rebound, no guarding, no tenderness at McBurney's point and negative Murphy's sign. No hernia.  Musculoskeletal: Normal range of motion.  Neurological: She is alert and oriented to person, place, and time. She has normal strength. No cranial nerve deficit or sensory deficit. Coordination normal. GCS eye subscore is 4. GCS verbal subscore is 5. GCS motor subscore is 6.  Extraocular muscle movement: normal No visual field cut Pupils: equal and reactive both direct and consensual response is normal No nystagmus present    Sensory function is intact to light touch, pinprick Proprioception intact  Grip strength 5/5 symmetric in upper extremities No pronator drift Normal finger to nose bilaterally  Lower extremity strength 5/5 against gravity Normal heel to shin bilaterally  Gait: normal   Skin: Skin is warm, dry and intact. No rash noted. No cyanosis.  Psychiatric: She has a normal mood and affect. Her speech is normal and behavior is normal. Thought content normal.  Nursing note and vitals  reviewed.    ED Treatments / Results  Labs (all labs ordered are listed, but only abnormal results are displayed) Labs Reviewed - No data to display  EKG  EKG Interpretation None       Radiology No results found.  Procedures Procedures (including critical care time)  Medications Ordered in ED Medications - No data to display   Initial Impression / Assessment and Plan / ED Course  I have reviewed the triage vital signs and the nursing notes.  Pertinent labs & imaging results that were available during my care of the patient were reviewed by me and considered in my medical decision making (see chart for details).     Presents to the emergency department for evaluation of tinnitus.  She has had this for  years.  Ringing got worse tonight she could not sleep and that her blood pressure became elevated.  Her examination is unremarkable.  This appears to be a chronic problem, does not require any workup.  Patient tells me that she was on clonazepam in the past and it helped, but she stopped because it is "addictive".  She also is not taking the Ambien that is on her list.  I did counsel her that occasional use of benzodiazepine would not be harmful and will give her a prescription for xanax 0.25 qhs prn.  Final Clinical Impressions(s) / ED Diagnoses   Final diagnoses:  Tinnitus, unspecified laterality    ED Discharge Orders        Ordered    ALPRAZolam (XANAX) 0.25 MG tablet  At bedtime PRN     02/05/18 0301       Orpah Greek, MD 02/05/18 0301    Orpah Greek, MD 02/05/18 [phone]

## 2018-02-05 NOTE — ED Triage Notes (Signed)
C/o ringing in R ear since 9pm.  Also c/o hypertension.

## 2018-03-01 ENCOUNTER — Ambulatory Visit (HOSPITAL_COMMUNITY)
Admission: EM | Admit: 2018-03-01 | Discharge: 2018-03-01 | Disposition: A | Discharge status: Home / Self Care | Payer: Medicare Other | Attending: Family Medicine | Admitting: Family Medicine

## 2018-03-01 ENCOUNTER — Encounter (HOSPITAL_COMMUNITY): Payer: Self-pay | Admitting: Family Medicine

## 2018-03-01 DIAGNOSIS — F419 Anxiety disorder, unspecified: Secondary | ICD-10-CM

## 2018-03-01 NOTE — Discharge Instructions (Addendum)
Please follow up with your primary care doctor for recheck. May benefit from following up with a counselor or therapist in the future.  If symptoms worsen or do not improve in the next week to return to be seen or to follow up with PCP.

## 2018-03-01 NOTE — ED Provider Notes (Signed)
[place]    CSN: [id number] Arrival date & time: 03/01/18  1323     History   Chief Complaint Chief Complaint  Patient presents with  . Anxiety    HPI Allison Franco is a 77 y.o. female.   Allison Franco presents with complaints of anxiety. She describes a stressful situation between a friend who sold items to patient's coworkers, and there was a financial discrepancy. She states she has been trying to mediate the situation, and just paid with her own money, but still has been stressful with her friend. She states she had historically used clonazepam for anxiety in the past, however, she has discontinued this medication and does not wish to continue due to addictive quality. She takes blood pressure medication. She states she has no other concerns. States she lives alone, therefore when she feels anxious it helps her feel better to be seen by a healthcare professional. She denies any physical complaints. Denies any suicidal or homicidal ideation. Denies chest pain or palpitations.    ROS per HPI.      Past Medical History:  Diagnosis Date  . Anxiety   . Constipation   . Depression    "following husband's death"  . Hypertension   . Tinnitus     Patient Active Problem List   Diagnosis Date Noted  . Grief reaction 10/20/2013  . Hyponatremia 10/20/2013  . Obesity 10/20/2013  . Atypical chest pain 09/01/2013  . ALLERGIC RHINITIS 08/11/2009  . KNEE PAIN, BILATERAL 02/13/2009  . COUGH 02/13/2009  . INSOMNIA 07/17/2008  . TIREDNESS 04/16/2008  . HYPERTENSION 01/24/2008  . BACK PAIN, LUMBAR 01/22/2008  . HYPERGLYCEMIA, BORDERLINE 01/27/2007    Past Surgical History:  Procedure Laterality Date  . ABDOMINAL HYSTERECTOMY    . fribroid surgery      OB History   None      Home Medications    Prior to Admission medications   Medication Sig Start Date End Date Taking? Authorizing Provider  acetaminophen (TYLENOL) 325 MG tablet Take 325-650 mg by mouth every 6  (six) hours as needed for mild pain, moderate pain or headache. Reported on 05/10/2016    [provider]  ALPRAZolam Duanne Moron) 0.25 MG tablet Take 1 tablet (0.25 mg total) by mouth at bedtime as needed for anxiety (or ringing in ear). 02/05/18   Orpah Greek, MD  benzonatate (TESSALON) 100 MG capsule Take 1 capsule (100 mg total) by mouth every 8 (eight) hours. 01/05/18   Tasia Catchings, Amy V, PA-C  Calcium Carbonate-Vitamin D 600-400 MG-UNIT tablet Take 1 tablet by mouth daily.    [provider]  diphenhydrAMINE (BENADRYL) 25 mg capsule Take 1 capsule (25 mg total) by mouth at bedtime as needed for allergies. 12/01/17   Tasia Catchings, Amy V, PA-C  EPINEPHrine (EPIPEN 2-PAK) 0.3 mg/0.3 mL IJ SOAJ injection Inject 0.3 mLs (0.3 mg total) into the muscle once as needed (for severe allergic reaction). CAll 911 immediately if you have to use this medicine 09/10/17   Larene Pickett, PA-C  fluticasone [facility]) 50 MCG/ACT nasal spray Place 2 sprays into both nostrils daily. 01/05/18   Tasia Catchings, Amy V, PA-C  ipratropium (ATROVENT) 0.06 % nasal spray Place 2 sprays into both nostrils 4 (four) times daily. 01/05/18   Tasia Catchings, Amy V, PA-C  lisinopril (PRINIVIL,ZESTRIL) 10 MG tablet Take 10 mg by mouth daily.    [provider]  Melatonin 5 MG TABS Take 1 tablet by mouth every morning.    [provider]  meloxicam (MOBIC) 7.5 MG tablet Take 1 tablet (7.5 mg total) by mouth daily. 01/05/18   Tasia Catchings, Amy V, PA-C  zolpidem (AMBIEN) 5 MG tablet Take 1 tablet (5 mg total) by mouth at bedtime as needed for sleep. 10/31/17   Robyn Haber, MD    Family History Family History  Problem Relation Age of Onset  . Diabetes Mother   . Hypertension Father     Social History Social History   Tobacco Use  . Smoking status: Never Smoker  . Smokeless tobacco: Never Used  Substance Use Topics  . Alcohol use: No  . Drug use: No     Allergies   Aspirin; Hydroxyzine; and Valium [diazepam]   Review of  Systems Review of Systems   Physical Exam Triage Vital Signs ED Triage Vitals [03/01/18 1401]  Enc Vitals Group     BP 136/81     Pulse Rate 78     Resp 18     Temp 98.4 F (36.9 C)     Temp src      SpO2 99 %     Weight      Height      Head Circumference      Peak Flow      Pain Score 4     Pain Loc      Pain Edu?      Excl. in [place]?    No data found.  Updated Vital Signs BP 136/81   Pulse 78   Temp 98.4 F (36.9 C)   Resp 18   SpO2 99%   Visual Acuity Right Eye Distance:   Left Eye Distance:   Bilateral Distance:    Right Eye Near:   Left Eye Near:    Bilateral Near:     Physical Exam  Constitutional: She is oriented to person, place, and time. She appears well-developed and well-nourished. No distress.  Eyes: Pupils are equal, round, and reactive to light. EOM are normal.  Cardiovascular: Normal rate, regular rhythm and normal heart sounds.  Pulmonary/Chest: Effort normal and breath sounds normal.  Neurological: She is alert and oriented to person, place, and time.  Skin: Skin is warm and dry.  Psychiatric: She has a normal mood and affect. Her behavior is normal. Thought content normal.     UC Treatments / Results  Labs (all labs ordered are listed, but only abnormal results are displayed) Labs Reviewed - No data to display  EKG None Radiology No results found.  Procedures Procedures (including critical care time)  Medications Ordered in UC Medications - No data to display   Initial Impression / Assessment and Plan / UC Course  I have reviewed the triage vital signs and the nursing notes.  Pertinent labs & imaging results that were available during my care of the patient were reviewed by me and considered in my medical decision making (see chart for details).     Without acute physical findings on exam. Patient without indications of harm to self or others. She is calm and collected without acute distress. Discussed stressor with  patient. She states she feels much less anxiety in just seeing a provider in clinic. Declines 1 time dose of antianxiety. Encouraged follow up with pcp and/or therapy as this may be helpful for her. Patient verbalized understanding and agreeable to plan.    Final Clinical Impressions(s) / UC Diagnoses   Final diagnoses:  Anxiety    ED Discharge Orders    None  Controlled Substance Prescriptions [place] Controlled Substance Registry consulted? Not Applicable   Zigmund Gottron, NP 03/01/18 1440

## 2018-03-01 NOTE — ED Triage Notes (Signed)
Pt here for anxiety and restlessness since last night. Slight headache. She took 2 tylenol and BP meds. headache slightly decreased. Reports this started after a phone call and stressful situation with a friend.

## 2018-03-19 ENCOUNTER — Encounter (HOSPITAL_COMMUNITY): Payer: Self-pay | Admitting: Emergency Medicine

## 2018-03-19 ENCOUNTER — Other Ambulatory Visit: Payer: Self-pay

## 2018-03-19 ENCOUNTER — Emergency Department (HOSPITAL_COMMUNITY)
Admission: EM | Admit: 2018-03-19 | Discharge: 2018-03-19 | Disposition: A | Discharge status: Home / Self Care | Payer: Medicare Other | Attending: Emergency Medicine | Admitting: Emergency Medicine

## 2018-03-19 DIAGNOSIS — Z79899 Other long term (current) drug therapy: Secondary | ICD-10-CM | POA: Insufficient documentation

## 2018-03-19 DIAGNOSIS — H9313 Tinnitus, bilateral: Secondary | ICD-10-CM | POA: Diagnosis not present

## 2018-03-19 DIAGNOSIS — I10 Essential (primary) hypertension: Secondary | ICD-10-CM | POA: Insufficient documentation

## 2018-03-19 DIAGNOSIS — F419 Anxiety disorder, unspecified: Secondary | ICD-10-CM | POA: Diagnosis not present

## 2018-03-19 NOTE — ED Notes (Signed)
Patient verbalized understanding of discharge instructions. She states she will stop taking ibuprofen and will begin taking Tylenol instead. She states she will call both her PCP and the recommended ENT specialist tomorrow to set up appointments with both regarding anxiety and the ringing in her ears. Patient appears less anxious after discussing instructions with her. Patient ambulatory with steady gait.

## 2018-03-19 NOTE — ED Notes (Signed)
PA-C at bedside 

## 2018-03-19 NOTE — Discharge Instructions (Addendum)
Not use ibuprofen.  He may use Tylenol for pain.  Regarding her anxiety you must follow-up with her primary care physician.  The emergency department does not supply benzodiazepines for anxiety.  Please follow-up with an ear nose and throat doctor regarding your ringing in your ears.

## 2018-03-19 NOTE — ED Triage Notes (Signed)
Patient to ED worried that she took something she is allergic to. Patient reports she is allergic to aspirin and at work a few days ago, one of the teachers gave her a bottle of ibuprofen to take. Patient states she has been taking one a day for a pulled muscle. Patient has ibuprofen bottle with her and noticed under the warning, it stated "do not take if you take other NSAIDs (such as aspirin)." Patient informed that she did not take aspirin, but she then reported the ringing in her ears worsened after she began taking the ibuprofen - not a new problem, she states she has had it for about a year and has been seen off and on for it. Denies other symptoms.

## 2018-03-19 NOTE — ED Provider Notes (Signed)
Allison Franco Provider Note   CSN: [id number] Arrival date & time: 03/19/18  1930     History   Chief Complaint Chief Complaint  Patient presents with  . Tinnitus    HPI Allison Franco is a 77 y.o. female presents the emergency Franco for complaint of ringing in her ears.  She has had ringing in her ears for over one year.  She states that she always comes to the emergency Franco because "we are her friends."  The patient states that her ear ringing got worse when she started taking ibuprofen because of a pulled muscle.  She denies dizziness or symptoms of vertigo.  Patient also complains that she needs something to "calm me down."  She states that "clonazepam works very well for me."  She has not followed up with her primary care doctor about her anxiety issues.  HPI  Past Medical History:  Diagnosis Date  . Anxiety   . Constipation   . Depression    "following husband's death"  . Hypertension   . Tinnitus     Patient Active Problem List   Diagnosis Date Noted  . Grief reaction 10/20/2013  . Hyponatremia 10/20/2013  . Obesity 10/20/2013  . Atypical chest pain 09/01/2013  . ALLERGIC RHINITIS 08/11/2009  . KNEE PAIN, BILATERAL 02/13/2009  . COUGH 02/13/2009  . INSOMNIA 07/17/2008  . TIREDNESS 04/16/2008  . HYPERTENSION 01/24/2008  . BACK PAIN, LUMBAR 01/22/2008  . HYPERGLYCEMIA, BORDERLINE 01/27/2007    Past Surgical History:  Procedure Laterality Date  . ABDOMINAL HYSTERECTOMY    . fribroid surgery       OB History   None      Home Medications    Prior to Admission medications   Medication Sig Start Date End Date Taking? Authorizing Provider  acetaminophen (TYLENOL) 325 MG tablet Take 325-650 mg by mouth every 6 (six) hours as needed for mild pain, moderate pain or headache. Reported on 05/10/2016    [provider]  ALPRAZolam Duanne Moron) 0.25 MG tablet Take 1 tablet (0.25 mg total) by mouth at bedtime as  needed for anxiety (or ringing in ear). 02/05/18   Orpah Greek, MD  benzonatate (TESSALON) 100 MG capsule Take 1 capsule (100 mg total) by mouth every 8 (eight) hours. 01/05/18   Tasia Catchings, Amy V, PA-C  Calcium Carbonate-Vitamin D 600-400 MG-UNIT tablet Take 1 tablet by mouth daily.    [provider]  diphenhydrAMINE (BENADRYL) 25 mg capsule Take 1 capsule (25 mg total) by mouth at bedtime as needed for allergies. 12/01/17   Tasia Catchings, Amy V, PA-C  EPINEPHrine (EPIPEN 2-PAK) 0.3 mg/0.3 mL IJ SOAJ injection Inject 0.3 mLs (0.3 mg total) into the muscle once as needed (for severe allergic reaction). CAll 911 immediately if you have to use this medicine 09/10/17   Larene Pickett, PA-C  fluticasone [facility]) 50 MCG/ACT nasal spray Place 2 sprays into both nostrils daily. 01/05/18   Tasia Catchings, Amy V, PA-C  ipratropium (ATROVENT) 0.06 % nasal spray Place 2 sprays into both nostrils 4 (four) times daily. 01/05/18   Tasia Catchings, Amy V, PA-C  lisinopril (PRINIVIL,ZESTRIL) 10 MG tablet Take 10 mg by mouth daily.    [provider]  Melatonin 5 MG TABS Take 1 tablet by mouth every morning.    [provider]  meloxicam (MOBIC) 7.5 MG tablet Take 1 tablet (7.5 mg total) by mouth daily. 01/05/18   Tasia Catchings, Amy V, PA-C  zolpidem (AMBIEN) 5 MG tablet Take  1 tablet (5 mg total) by mouth at bedtime as needed for sleep. 10/31/17   Robyn Haber, MD    Family History Family History  Problem Relation Age of Onset  . Diabetes Mother   . Hypertension Father     Social History Social History   Tobacco Use  . Smoking status: Never Smoker  . Smokeless tobacco: Never Used  Substance Use Topics  . Alcohol use: No  . Drug use: No     Allergies   Aspirin; Hydroxyzine; and Valium [diazepam]   Review of Systems Review of Systems  Ten systems reviewed and are negative for acute change, except as noted in the HPI.   Physical Exam Updated Vital Signs BP (!) 169/82 (BP Location: Right Arm)   Pulse (!) 107    Temp 99 F (37.2 C) (Oral)   Resp 19   Ht 5\' 3"  (1.6 m)   Wt 84.4 kg (186 lb)   SpO2 96%   BMI 32.95 kg/m   Physical Exam  Constitutional: She is oriented to person, place, and time. She appears well-developed and well-nourished. No distress.  HENT:  Head: Normocephalic and atraumatic.  TMs normal bilaterally  Eyes: Conjunctivae are normal. No scleral icterus.  Neck: Normal range of motion.  Cardiovascular: Normal rate, regular rhythm and normal heart sounds. Exam reveals no gallop and no friction rub.  No murmur heard. Pulmonary/Chest: Effort normal and breath sounds normal. No respiratory distress.  Abdominal: Soft. Bowel sounds are normal. She exhibits no distension and no mass. There is no tenderness. There is no guarding.  Neurological: She is alert and oriented to person, place, and time.  Skin: Skin is warm and dry. Capillary refill takes less than 2 seconds. She is not diaphoretic.  Psychiatric: Her behavior is normal.  Nursing note and vitals reviewed.    ED Treatments / Results  Labs (all labs ordered are listed, but only abnormal results are displayed) Labs Reviewed - No data to display  EKG None  Radiology No results found.  Procedures Procedures (including critical care time)  Medications Ordered in ED Medications - No data to display   Initial Impression / Assessment and Plan / ED Course  I have reviewed the triage vital signs and the nursing notes.  Pertinent labs & imaging results that were available during my care of the patient were reviewed by me and considered in my medical decision making (see chart for details).     Patient here with complaint of tinnitus and anxiety.  Patient becomes very upset when I state that we do not prescribe benzodiazepines for chronic anxiety and that that is something she should follow-up with her primary care physician about.  I discussed that the emergency Franco is here to evaluate life limb and organ  threatening emergencies and her anxiety seems well controlled at this time.  I have advised her to make a close follow-up appointment with her PCP and have given her referral to ENT for her tinnitus.  She should not take any drugs within the NSAID class and may take Tylenol otherwise for pain.  She appears appropriate for discharge at this time  Final Clinical Impressions(s) / ED Diagnoses   Final diagnoses:  None    ED Discharge Orders    None       Margarita Mail, PA-C 03/19/18 2101    Deno Etienne, DO 03/19/18 2328

## 2018-03-21 DIAGNOSIS — F419 Anxiety disorder, unspecified: Secondary | ICD-10-CM | POA: Diagnosis not present

## 2018-03-21 DIAGNOSIS — H9313 Tinnitus, bilateral: Secondary | ICD-10-CM | POA: Diagnosis not present

## 2018-03-21 DIAGNOSIS — I10 Essential (primary) hypertension: Secondary | ICD-10-CM | POA: Diagnosis not present

## 2018-03-23 ENCOUNTER — Ambulatory Visit (HOSPITAL_COMMUNITY)
Admission: EM | Admit: 2018-03-23 | Discharge: 2018-03-23 | Disposition: A | Discharge status: Home / Self Care | Payer: Medicare Other | Attending: Family Medicine | Admitting: Family Medicine

## 2018-03-23 ENCOUNTER — Encounter (HOSPITAL_COMMUNITY): Payer: Self-pay | Admitting: Family Medicine

## 2018-03-23 DIAGNOSIS — R220 Localized swelling, mass and lump, head: Secondary | ICD-10-CM | POA: Diagnosis not present

## 2018-03-23 MED ORDER — METHYLPREDNISOLONE SODIUM SUCC 125 MG IJ SOLR
INTRAMUSCULAR | Status: AC
  Filled 2018-03-23: qty 2

## 2018-03-23 MED ORDER — PREDNISONE 10 MG PO TABS: 20.0000 mg | ORAL_TABLET | Freq: Every day | ORAL | 0 refills | Status: DC

## 2018-03-23 MED ORDER — PREDNISONE 50 MG PO TABS: 50.0000 mg | ORAL_TABLET | Freq: Every day | ORAL | 0 refills | Status: AC

## 2018-03-23 MED ORDER — METHYLPREDNISOLONE SODIUM SUCC 125 MG IJ SOLR
125.0000 mg | Freq: Once | INTRAMUSCULAR | Status: AC
  Administered 2018-03-23: 125 mg via INTRAMUSCULAR

## 2018-03-23 NOTE — ED Provider Notes (Signed)
[place]    CSN: [id number] Arrival date & time: 03/23/18  1608     History   Chief Complaint Chief Complaint  Patient presents with  . Facial Swelling    HPI Allison Franco is a 77 y.o. female presenting today for evaluation of left facial swelling.  Symptoms began all of a sudden today while she was eating [employer].  States she only ate half of her sandwich because the swelling came on.  Is having numbness sensation in her left lower lip.  Patient also states she is on lisinopril.  She has been on this for years.  Denies difficulty breathing or shortness of breath.  Denies difficulty swallowing.  Patient also endorsing multiple other complaints including hip pain, concern for hernia, ringing in her ear.  HPI  Past Medical History:  Diagnosis Date  . Anxiety   . Constipation   . Depression    "following husband's death"  . Hypertension   . Tinnitus     Patient Active Problem List   Diagnosis Date Noted  . Grief reaction 10/20/2013  . Hyponatremia 10/20/2013  . Obesity 10/20/2013  . Atypical chest pain 09/01/2013  . ALLERGIC RHINITIS 08/11/2009  . KNEE PAIN, BILATERAL 02/13/2009  . COUGH 02/13/2009  . INSOMNIA 07/17/2008  . TIREDNESS 04/16/2008  . HYPERTENSION 01/24/2008  . BACK PAIN, LUMBAR 01/22/2008  . HYPERGLYCEMIA, BORDERLINE 01/27/2007    Past Surgical History:  Procedure Laterality Date  . ABDOMINAL HYSTERECTOMY    . fribroid surgery      OB History   None      Home Medications    Prior to Admission medications   Medication Sig Start Date End Date Taking? Authorizing Provider  acetaminophen (TYLENOL) 325 MG tablet Take 325-650 mg by mouth every 6 (six) hours as needed for mild pain, moderate pain or headache. Reported on 05/10/2016    [provider]  ALPRAZolam Duanne Moron) 0.25 MG tablet Take 1 tablet (0.25 mg total) by mouth at bedtime as needed for anxiety (or ringing in ear). 02/05/18   Orpah Greek, MD  benzonatate  (TESSALON) 100 MG capsule Take 1 capsule (100 mg total) by mouth every 8 (eight) hours. 01/05/18   Tasia Catchings, Amy V, PA-C  Calcium Carbonate-Vitamin D 600-400 MG-UNIT tablet Take 1 tablet by mouth daily.    [provider]  diphenhydrAMINE (BENADRYL) 25 mg capsule Take 1 capsule (25 mg total) by mouth at bedtime as needed for allergies. 12/01/17   Tasia Catchings, Amy V, PA-C  EPINEPHrine (EPIPEN 2-PAK) 0.3 mg/0.3 mL IJ SOAJ injection Inject 0.3 mLs (0.3 mg total) into the muscle once as needed (for severe allergic reaction). CAll 911 immediately if you have to use this medicine 09/10/17   Larene Pickett, PA-C  fluticasone [facility]) 50 MCG/ACT nasal spray Place 2 sprays into both nostrils daily. 01/05/18   Tasia Catchings, Amy V, PA-C  ipratropium (ATROVENT) 0.06 % nasal spray Place 2 sprays into both nostrils 4 (four) times daily. 01/05/18   Tasia Catchings, Amy V, PA-C  lisinopril (PRINIVIL,ZESTRIL) 10 MG tablet Take 10 mg by mouth daily.    [provider]  Melatonin 5 MG TABS Take 1 tablet by mouth every morning.    [provider]  meloxicam (MOBIC) 7.5 MG tablet Take 1 tablet (7.5 mg total) by mouth daily. 01/05/18   Tasia Catchings, Amy V, PA-C  predniSONE (DELTASONE) 50 MG tablet Take 1 tablet (50 mg total) by mouth daily for 5 days. 03/23/18 03/28/18  Wieters, Elesa Hacker, PA-C  zolpidem (AMBIEN) 5 MG tablet Take 1 tablet (5 mg total) by mouth at bedtime as needed for sleep. 10/31/17   Robyn Haber, MD    Family History Family History  Problem Relation Age of Onset  . Diabetes Mother   . Hypertension Father     Social History Social History   Tobacco Use  . Smoking status: Never Smoker  . Smokeless tobacco: Never Used  Substance Use Topics  . Alcohol use: No  . Drug use: No     Allergies   Aspirin; Hydroxyzine; and Valium [diazepam]   Review of Systems Review of Systems  HENT: Positive for facial swelling. Negative for congestion, rhinorrhea, sore throat and trouble swallowing.   Respiratory: Negative for cough,  chest tightness and shortness of breath.   Cardiovascular: Negative for chest pain.  Gastrointestinal: Negative for nausea and vomiting.  Skin: Negative for color change and rash.  Neurological: Negative for dizziness, weakness, light-headedness and headaches.     Physical Exam Triage Vital Signs ED Triage Vitals [03/23/18 1627]  Enc Vitals Group     BP (!) 155/79     Pulse Rate 87     Resp 18     Temp 98 F (36.7 C)     Temp src      SpO2 97 %     Weight      Height      Head Circumference      Peak Flow      Pain Score 0     Pain Loc      Pain Edu?      Excl. in [place]?    No data found.  Updated Vital Signs BP (!) 155/79   Pulse 87   Temp 98 F (36.7 C)   Resp 18   SpO2 97%   Visual Acuity Right Eye Distance:   Left Eye Distance:   Bilateral Distance:    Right Eye Near:   Left Eye Near:    Bilateral Near:     Physical Exam  Constitutional: She is oriented to person, place, and time. She appears well-developed and well-nourished. No distress.  HENT:  Head: Normocephalic and atraumatic.  Moderate swelling to left lower lip, mild swelling to left side of face, posterior oropharynx clear  Eyes: Pupils are equal, round, and reactive to light. Conjunctivae and EOM are normal.  Neck: Neck supple.  Cardiovascular: Normal rate and regular rhythm.  No murmur heard. Pulmonary/Chest: Effort normal and breath sounds normal. No respiratory distress.  Breathing comfortably at rest  Abdominal: Soft. There is no tenderness.  Musculoskeletal: She exhibits no edema.  Neurological: She is alert and oriented to person, place, and time. No cranial nerve deficit.  Skin: Skin is warm and dry.  Psychiatric: She has a normal mood and affect.  Nursing note and vitals reviewed.    UC Treatments / Results  Labs (all labs ordered are listed, but only abnormal results are displayed) Labs Reviewed - No data to display  EKG None Radiology No results  found.  Procedures Procedures (including critical care time)  Medications Ordered in UC Medications  methylPREDNISolone sodium succinate (SOLU-MEDROL) 125 mg/2 mL injection 125 mg (has no administration in time range)     Initial Impression / Assessment and Plan / UC Course  I have reviewed the triage vital signs and the nursing notes.  Pertinent labs & imaging results that were available during my care of the patient were reviewed by me and considered in my medical  decision making (see chart for details).     Patient with left facial swelling, possible partial angioedema.  Will provide patient with Solu-Medrol injection today, begin prednisone tomorrow.  Will have patient stop lisinopril temporarily, contact PCP for further advice on blood pressure management.  Monitor blood pressure at home.  Follow-up with PCP for further treatment and evaluation.  Return here if symptoms worsening or not improving.  Final Clinical Impressions(s) / UC Diagnoses   Final diagnoses:  Facial swelling    ED Discharge Orders        Ordered    predniSONE (DELTASONE) 10 MG tablet  Daily,   Status:  Discontinued     03/23/18 1651    predniSONE (DELTASONE) 50 MG tablet  Daily    Note to Pharmacy:  Disregard previous rx   03/23/18 1652       Controlled Substance Prescriptions [place] Controlled Substance Registry consulted? Not Applicable   Janith Lima, [place] 03/23/18 1717

## 2018-03-23 NOTE — Discharge Instructions (Addendum)
Begin Prednisone 50 mg for 5 days  Follow up here or with PCP if symptoms persisting or worsening.

## 2018-03-23 NOTE — ED Triage Notes (Signed)
Pt here for left facial swelling that begun after eating [employer] today. Denies pain.

## 2018-03-28 DIAGNOSIS — T7840XS Allergy, unspecified, sequela: Secondary | ICD-10-CM | POA: Diagnosis not present

## 2018-03-28 DIAGNOSIS — E559 Vitamin D deficiency, unspecified: Secondary | ICD-10-CM | POA: Diagnosis not present

## 2018-03-28 DIAGNOSIS — I10 Essential (primary) hypertension: Secondary | ICD-10-CM | POA: Diagnosis not present

## 2018-04-17 ENCOUNTER — Ambulatory Visit (HOSPITAL_COMMUNITY)
Admission: EM | Admit: 2018-04-17 | Discharge: 2018-04-17 | Disposition: A | Discharge status: Home / Self Care | Payer: Medicare Other | Attending: Family Medicine | Admitting: Family Medicine

## 2018-04-17 ENCOUNTER — Encounter (HOSPITAL_COMMUNITY): Payer: Self-pay | Admitting: Emergency Medicine

## 2018-04-17 DIAGNOSIS — S161XXA Strain of muscle, fascia and tendon at neck level, initial encounter: Secondary | ICD-10-CM | POA: Diagnosis not present

## 2018-04-17 MED ORDER — CETIRIZINE HCL 10 MG PO CAPS: 10.0000 mg | ORAL_CAPSULE | Freq: Every day | ORAL | 0 refills | Status: DC

## 2018-04-17 MED ORDER — MELOXICAM 7.5 MG PO TABS: 7.5000 mg | ORAL_TABLET | Freq: Every day | ORAL | 0 refills | Status: DC

## 2018-04-17 NOTE — ED Provider Notes (Signed)
[place]    CSN: [id number] Arrival date & time: 04/17/18  1028     History   Chief Complaint Chief Complaint  Patient presents with  . Headache  . Neck Pain    HPI Jonesha Tsuchiya is a 77 y.o. female history of hypertension, tinnitus presenting today for evaluation of headache and neck pain.  States that she has pain on the right side of her neck.  This began approximately 3 days ago.  She does remember a fall 3 days ago where she accidentally fell out of her chair while at work.  She denies hitting her head or loss of consciousness.  States that it is maybe only her body that hit the ground.  Denies any changes in vision, nausea, vomiting, dizziness.  Denies any shortness of breath or chest pain.  Pain mainly with moving.  Has taken Tylenol without relief.  HPI  Past Medical History:  Diagnosis Date  . Anxiety   . Constipation   . Depression    "following husband's death"  . Hypertension   . Tinnitus     Patient Active Problem List   Diagnosis Date Noted  . Grief reaction 10/20/2013  . Hyponatremia 10/20/2013  . Obesity 10/20/2013  . Atypical chest pain 09/01/2013  . ALLERGIC RHINITIS 08/11/2009  . KNEE PAIN, BILATERAL 02/13/2009  . COUGH 02/13/2009  . INSOMNIA 07/17/2008  . TIREDNESS 04/16/2008  . HYPERTENSION 01/24/2008  . BACK PAIN, LUMBAR 01/22/2008  . HYPERGLYCEMIA, BORDERLINE 01/27/2007    Past Surgical History:  Procedure Laterality Date  . ABDOMINAL HYSTERECTOMY    . fribroid surgery      OB History   None      Home Medications    Prior to Admission medications   Medication Sig Start Date End Date Taking? Authorizing Provider  acetaminophen (TYLENOL) 325 MG tablet Take 325-650 mg by mouth every 6 (six) hours as needed for mild pain, moderate pain or headache. Reported on 05/10/2016    [provider]  ALPRAZolam Duanne Moron) 0.25 MG tablet Take 1 tablet (0.25 mg total) by mouth at bedtime as needed for anxiety (or ringing in  ear). 02/05/18   Orpah Greek, MD  benzonatate (TESSALON) 100 MG capsule Take 1 capsule (100 mg total) by mouth every 8 (eight) hours. 01/05/18   Tasia Catchings, Amy V, PA-C  Calcium Carbonate-Vitamin D 600-400 MG-UNIT tablet Take 1 tablet by mouth daily.    [provider]  Cetirizine HCl 10 MG CAPS Take 1 capsule (10 mg total) by mouth daily. 04/17/18   ,  C, PA-C  diphenhydrAMINE (BENADRYL) 25 mg capsule Take 1 capsule (25 mg total) by mouth at bedtime as needed for allergies. 12/01/17   Tasia Catchings, Amy V, PA-C  EPINEPHrine (EPIPEN 2-PAK) 0.3 mg/0.3 mL IJ SOAJ injection Inject 0.3 mLs (0.3 mg total) into the muscle once as needed (for severe allergic reaction). CAll 911 immediately if you have to use this medicine 09/10/17   Larene Pickett, PA-C  fluticasone [facility]) 50 MCG/ACT nasal spray Place 2 sprays into both nostrils daily. 01/05/18   Tasia Catchings, Amy V, PA-C  ipratropium (ATROVENT) 0.06 % nasal spray Place 2 sprays into both nostrils 4 (four) times daily. 01/05/18   Tasia Catchings, Amy V, PA-C  lisinopril (PRINIVIL,ZESTRIL) 10 MG tablet Take 10 mg by mouth daily.    [provider]  Melatonin 5 MG TABS Take 1 tablet by mouth every morning.    [provider]  meloxicam (MOBIC) 7.5 MG tablet Take  1 tablet (7.5 mg total) by mouth daily. 04/17/18   ,  C, PA-C  zolpidem (AMBIEN) 5 MG tablet Take 1 tablet (5 mg total) by mouth at bedtime as needed for sleep. 10/31/17   Robyn Haber, MD    Family History Family History  Problem Relation Age of Onset  . Diabetes Mother   . Hypertension Father     Social History Social History   Tobacco Use  . Smoking status: Never Smoker  . Smokeless tobacco: Never Used  Substance Use Topics  . Alcohol use: No  . Drug use: No     Allergies   Aspirin; Hydroxyzine; and Valium [diazepam]   Review of Systems Review of Systems  Constitutional: Negative for activity change and appetite change.  HENT: Negative for trouble  swallowing.   Eyes: Negative for pain and visual disturbance.  Respiratory: Negative for shortness of breath.   Cardiovascular: Negative for chest pain.  Gastrointestinal: Negative for abdominal pain, nausea and vomiting.  Musculoskeletal: Positive for myalgias, neck pain and neck stiffness. Negative for arthralgias, back pain and gait problem.  Skin: Negative for color change and wound.  Neurological: Positive for headaches. Negative for dizziness, seizures, syncope, weakness, light-headedness and numbness.     Physical Exam Triage Vital Signs ED Triage Vitals [04/17/18 1129]  Enc Vitals Group     BP (!) 145/78     Pulse Rate 76     Resp 18     Temp 97.7 F (36.5 C)     Temp Source Oral     SpO2 99 %     Weight      Height      Head Circumference      Peak Flow      Pain Score      Pain Loc      Pain Edu?      Excl. in [place]?    No data found.  Updated Vital Signs BP (!) 145/78 (BP Location: Left Arm)   Pulse 76   Temp 97.7 F (36.5 C) (Oral)   Resp 18   SpO2 99%   Visual Acuity Right Eye Distance:   Left Eye Distance:   Bilateral Distance:    Right Eye Near:   Left Eye Near:    Bilateral Near:     Physical Exam  Constitutional: She appears well-developed and well-nourished. No distress.  HENT:  Head: Normocephalic and atraumatic.  Eyes: Conjunctivae are normal.  Neck: Neck supple.  Cardiovascular: Normal rate and regular rhythm.  No murmur heard. Pulmonary/Chest: Effort normal and breath sounds normal. No respiratory distress.  Abdominal: Soft. There is no tenderness.  Musculoskeletal: She exhibits no edema.  Tenderness to palpation along right superior trapezius muscle, pain elicited with resisted rotation of neck to the right Nontender to palpation of cervical spine midline as well as left cervical musculature  Neurological: She is alert.  Patient A&O x3, cranial nerves II-XII grossly intact, strength at shoulders, hips and knees 5/5, equal  bilaterally, Gait without abnormality.   Skin: Skin is warm and dry.  Nontender to palpation of the scalp  Psychiatric: She has a normal mood and affect.  Nursing note and vitals reviewed.    UC Treatments / Results  Labs (all labs ordered are listed, but only abnormal results are displayed) Labs Reviewed - No data to display  EKG None  Radiology No results found.  Procedures Procedures (including critical care time)  Medications Ordered in UC Medications - No data to display  Initial Impression / Assessment and Plan / UC Course  I have reviewed the triage vital signs and the nursing notes.  Pertinent labs & imaging results that were available during my care of the patient were reviewed by me and considered in my medical decision making (see chart for details).     Patient likely with cervical strain related to fall.  No focal neuro deficits, vital signs stable.  Recommend anti-inflammatories, provided Mobic 7.5 daily.Discussed strict return precautions. Patient verbalized understanding and is agreeable with plan. Also providing her with daily Zyrtec and she states that she is taking Benadryl during the day for allergies and is causing her to be sleepy. Final Clinical Impressions(s) / UC Diagnoses   Final diagnoses:  Strain of neck muscle, initial encounter     Discharge Instructions     Please use daily Zyrtec for allergies, this should not cause as much sedation as Benadryl.  He may continue to use Benadryl at night to help with sleep  Please use meloxicam daily to help with neck pain, may also apply ice or heating pad.  Please return if symptoms not improving or worsening.  Please return if you develop any dizziness, changes in vision, worsening pain, lightheadedness, difficulty speaking.   ED Prescriptions    Medication Sig Dispense Auth. Provider   Cetirizine HCl 10 MG CAPS Take 1 capsule (10 mg total) by mouth daily. 30 capsule ,  C, PA-C    meloxicam (MOBIC) 7.5 MG tablet Take 1 tablet (7.5 mg total) by mouth daily. 15 tablet , [place] C, PA-C     Controlled Substance Prescriptions [place] Controlled Substance Registry consulted? Not Applicable   Janith Lima, [place] 04/17/18 1222

## 2018-04-17 NOTE — ED Triage Notes (Signed)
Pt here for HA and neck pain; pt sts ringing in her ears x 2 years

## 2018-04-17 NOTE — Discharge Instructions (Signed)
Please use daily Zyrtec for allergies, this should not cause as much sedation as Benadryl.  He may continue to use Benadryl at night to help with sleep  Please use meloxicam daily to help with neck pain, may also apply ice or heating pad.  Please return if symptoms not improving or worsening.  Please return if you develop any dizziness, changes in vision, worsening pain, lightheadedness, difficulty speaking.

## 2018-06-01 ENCOUNTER — Other Ambulatory Visit: Payer: Self-pay

## 2018-06-01 ENCOUNTER — Emergency Department (HOSPITAL_COMMUNITY)
Admission: EM | Admit: 2018-06-01 | Discharge: 2018-06-01 | Disposition: A | Discharge status: Home / Self Care | Payer: Medicare Other | Attending: Emergency Medicine | Admitting: Emergency Medicine

## 2018-06-01 ENCOUNTER — Emergency Department (HOSPITAL_COMMUNITY): Payer: Medicare Other

## 2018-06-01 ENCOUNTER — Encounter (HOSPITAL_COMMUNITY): Payer: Self-pay | Admitting: Emergency Medicine

## 2018-06-01 DIAGNOSIS — R110 Nausea: Secondary | ICD-10-CM | POA: Diagnosis not present

## 2018-06-01 DIAGNOSIS — I10 Essential (primary) hypertension: Secondary | ICD-10-CM | POA: Insufficient documentation

## 2018-06-01 DIAGNOSIS — K4090 Unilateral inguinal hernia, without obstruction or gangrene, not specified as recurrent: Secondary | ICD-10-CM | POA: Insufficient documentation

## 2018-06-01 DIAGNOSIS — R42 Dizziness and giddiness: Secondary | ICD-10-CM | POA: Diagnosis not present

## 2018-06-01 DIAGNOSIS — Z79899 Other long term (current) drug therapy: Secondary | ICD-10-CM | POA: Insufficient documentation

## 2018-06-01 DIAGNOSIS — R1032 Left lower quadrant pain: Secondary | ICD-10-CM | POA: Diagnosis not present

## 2018-06-01 LAB — COMPREHENSIVE METABOLIC PANEL
ALBUMIN: 3.6 g/dL (ref 3.5–5.0)
ALK PHOS: 118 U/L (ref 38–126)
ALT: 26 U/L (ref 0–44)
ANION GAP: 9 (ref 5–15)
AST: 20 U/L (ref 15–41)
BUN: 12 mg/dL (ref 8–23)
CALCIUM: 8.5 mg/dL — AB (ref 8.9–10.3)
CHLORIDE: 93 mmol/L — AB (ref 98–111)
CO2: 25 mmol/L (ref 22–32)
CREATININE: 0.77 mg/dL (ref 0.44–1.00)
GFR calc Af Amer: >60 mL/min (ref >60)
GFR calc non Af Amer: >60 mL/min (ref >60)
GLUCOSE: 378 mg/dL — AB (ref 70–99)
Potassium: 4.2 mmol/L (ref 3.5–5.1)
Sodium: 127 mmol/L — ABNORMAL LOW (ref 135–145)
Total Bilirubin: 0.8 mg/dL (ref 0.3–1.2)
Total Protein: 6.6 g/dL (ref 6.5–8.1)

## 2018-06-01 LAB — URINALYSIS, ROUTINE W REFLEX MICROSCOPIC
BACTERIA UA: NONE SEEN
BILIRUBIN URINE: NEGATIVE
Glucose, UA: >=500 mg/dL — AB
HGB URINE DIPSTICK: NEGATIVE
KETONES UR: NEGATIVE mg/dL
Leukocytes, UA: NEGATIVE
NITRITE: NEGATIVE
PROTEIN: NEGATIVE mg/dL
SPECIFIC GRAVITY, URINE: 1.006 (ref 1.005–1.030)
pH: 7 (ref 5.0–8.0)

## 2018-06-01 LAB — LIPASE, BLOOD: Lipase: 33 U/L (ref 11–51)

## 2018-06-01 LAB — CBC WITH DIFFERENTIAL/PLATELET
ABS IMMATURE GRANULOCYTES: 0 10*3/uL (ref 0.0–0.1)
BASOS PCT: 1 %
Basophils Absolute: 0 10*3/uL (ref 0.0–0.1)
Eosinophils Absolute: 0.1 10*3/uL (ref 0.0–0.7)
Eosinophils Relative: 2 %
HCT: 42.9 % (ref 36.0–46.0)
HEMOGLOBIN: 13.7 g/dL (ref 12.0–15.0)
Immature Granulocytes: 0 %
Lymphocytes Relative: 23 %
Lymphs Abs: 1.4 10*3/uL (ref 0.7–4.0)
MCH: 27.1 pg (ref 26.0–34.0)
MCHC: 31.9 g/dL (ref 30.0–36.0)
MCV: 85 fL (ref 78.0–100.0)
MONOS PCT: 9 %
Monocytes Absolute: 0.5 10*3/uL (ref 0.1–1.0)
NEUTROS ABS: 3.9 10*3/uL (ref 1.7–7.7)
NEUTROS PCT: 65 %
PLATELETS: 231 10*3/uL (ref 150–400)
RBC: 5.05 MIL/uL (ref 3.87–5.11)
RDW: 11.7 % (ref 11.5–15.5)
WBC: 6 10*3/uL (ref 4.0–10.5)

## 2018-06-01 MED ORDER — IOPAMIDOL (ISOVUE-300) INJECTION 61%: 100.0000 mL | Freq: Once | INTRAVENOUS | Status: DC | PRN

## 2018-06-01 MED ORDER — SODIUM CHLORIDE 0.9 % IV BOLUS
1000.0000 mL | Freq: Once | INTRAVENOUS | Status: AC
  Administered 2018-06-01: 1000 mL via INTRAVENOUS

## 2018-06-01 MED ORDER — MORPHINE SULFATE (PF) 4 MG/ML IV SOLN
4.0000 mg | Freq: Once | INTRAVENOUS | Status: AC
  Administered 2018-06-01: 4 mg via INTRAVENOUS
  Filled 2018-06-01: qty 1

## 2018-06-01 MED ORDER — ONDANSETRON HCL 4 MG/2ML IJ SOLN
4.0000 mg | Freq: Once | INTRAMUSCULAR | Status: AC
  Administered 2018-06-01: 4 mg via INTRAVENOUS
  Filled 2018-06-01: qty 2

## 2018-06-01 MED ORDER — IOHEXOL 300 MG/ML  SOLN
100.0000 mL | Freq: Once | INTRAMUSCULAR | Status: AC | PRN
  Administered 2018-06-01: 100 mL via INTRAVENOUS

## 2018-06-01 NOTE — ED Notes (Signed)
Pt ambulatory to the restroom with 1 staff assist, steady gait, pt c.o dizziness during ambulation

## 2018-06-01 NOTE — ED Provider Notes (Signed)
[place] EMERGENCY DEPARTMENT Provider Note   CSN: [id number] Arrival date & time: 06/01/18  0802     History   Chief Complaint Chief Complaint  Patient presents with  . Abdominal Pain  . Dizziness    HPI Allison Franco is a 77 y.o. female.  Pt presents to the ED today with abdominal pain which started in the night.  Pt said she could not sleep last night due to the pain.  The pt points to her LLQ.  She also c/o dizziness because her vision is not very good any more.  She c/o nausea, no vomiting.     Past Medical History:  Diagnosis Date  . Anxiety   . Constipation   . Depression    "following husband's death"  . Hypertension   . Tinnitus     Patient Active Problem List   Diagnosis Date Noted  . Grief reaction 10/20/2013  . Hyponatremia 10/20/2013  . Obesity 10/20/2013  . Atypical chest pain 09/01/2013  . ALLERGIC RHINITIS 08/11/2009  . KNEE PAIN, BILATERAL 02/13/2009  . COUGH 02/13/2009  . INSOMNIA 07/17/2008  . TIREDNESS 04/16/2008  . HYPERTENSION 01/24/2008  . BACK PAIN, LUMBAR 01/22/2008  . HYPERGLYCEMIA, BORDERLINE 01/27/2007    Past Surgical History:  Procedure Laterality Date  . ABDOMINAL HYSTERECTOMY    . fribroid surgery       OB History   None      Home Medications    Prior to Admission medications   Medication Sig Start Date End Date Taking? Authorizing Provider  acetaminophen (TYLENOL) 325 MG tablet Take 325-650 mg by mouth every 6 (six) hours as needed for mild pain, moderate pain or headache. Reported on 05/10/2016    [provider]  ALPRAZolam Duanne Moron) 0.25 MG tablet Take 1 tablet (0.25 mg total) by mouth at bedtime as needed for anxiety (or ringing in ear). 02/05/18   Orpah Greek, MD  benzonatate (TESSALON) 100 MG capsule Take 1 capsule (100 mg total) by mouth every 8 (eight) hours. 01/05/18   Tasia Catchings, Amy V, PA-C  Calcium Carbonate-Vitamin D 600-400 MG-UNIT tablet Take 1 tablet by mouth daily.     [provider]  Cetirizine HCl 10 MG CAPS Take 1 capsule (10 mg total) by mouth daily. 04/17/18   Wieters, Hallie C, PA-C  diphenhydrAMINE (BENADRYL) 25 mg capsule Take 1 capsule (25 mg total) by mouth at bedtime as needed for allergies. 12/01/17   Tasia Catchings, Amy V, PA-C  EPINEPHrine (EPIPEN 2-PAK) 0.3 mg/0.3 mL IJ SOAJ injection Inject 0.3 mLs (0.3 mg total) into the muscle once as needed (for severe allergic reaction). CAll 911 immediately if you have to use this medicine 09/10/17   Larene Pickett, PA-C  fluticasone [facility]) 50 MCG/ACT nasal spray Place 2 sprays into both nostrils daily. 01/05/18   Tasia Catchings, Amy V, PA-C  ipratropium (ATROVENT) 0.06 % nasal spray Place 2 sprays into both nostrils 4 (four) times daily. 01/05/18   Tasia Catchings, Amy V, PA-C  lisinopril (PRINIVIL,ZESTRIL) 10 MG tablet Take 10 mg by mouth daily.    [provider]  Melatonin 5 MG TABS Take 1 tablet by mouth every morning.    [provider]  meloxicam (MOBIC) 7.5 MG tablet Take 1 tablet (7.5 mg total) by mouth daily. 04/17/18   Wieters, Hallie C, PA-C  zolpidem (AMBIEN) 5 MG tablet Take 1 tablet (5 mg total) by mouth at bedtime as needed for sleep. 10/31/17   Robyn Haber, MD  Family History Family History  Problem Relation Age of Onset  . Diabetes Mother   . Hypertension Father     Social History Social History   Tobacco Use  . Smoking status: Never Smoker  . Smokeless tobacco: Never Used  Substance Use Topics  . Alcohol use: No  . Drug use: No     Allergies   Aspirin; Hydroxyzine; and Valium [diazepam]   Review of Systems Review of Systems  Gastrointestinal: Positive for abdominal pain and nausea.  All other systems reviewed and are negative.    Physical Exam Updated Vital Signs BP 118/68   Pulse 72   Temp 97.9 F (36.6 C) (Oral)   Resp (!) 31   Ht 5\' 3"  (1.6 m)   Wt 81.6 kg (180 lb)   SpO2 97%   BMI 31.89 kg/m   Physical Exam  Constitutional: She is oriented to person,  place, and time. She appears well-developed and well-nourished.  HENT:  Head: Normocephalic and atraumatic.  Mouth/Throat: Oropharynx is clear and moist.  Eyes: Pupils are equal, round, and reactive to light. EOM are normal.  Cardiovascular: Normal rate, regular rhythm, normal heart sounds and intact distal pulses.  Pulmonary/Chest: Effort normal and breath sounds normal.  Abdominal: Normal appearance and bowel sounds are normal. There is tenderness in the left lower quadrant. A hernia is present. Hernia confirmed positive in the left inguinal area.  Neurological: She is alert and oriented to person, place, and time.  Skin: Skin is warm. Capillary refill takes less than 2 seconds.  Psychiatric: She has a normal mood and affect. Her behavior is normal.  Nursing note and vitals reviewed.    ED Treatments / Results  Labs (all labs ordered are listed, but only abnormal results are displayed) Labs Reviewed  COMPREHENSIVE METABOLIC PANEL - Abnormal; Notable for the following components:      Result Value   Sodium 127 (*)    Chloride 93 (*)    Glucose, Bld 378 (*)    Calcium 8.5 (*)    All other components within normal limits  URINALYSIS, ROUTINE W REFLEX MICROSCOPIC - Abnormal; Notable for the following components:   Color, Urine STRAW (*)    Glucose, UA >=500 (*)    All other components within normal limits  CBC WITH DIFFERENTIAL/PLATELET  LIPASE, BLOOD    EKG EKG Interpretation  Date/Time:  Friday June 01 2018 08:29:33 EDT Ventricular Rate:  80 PR Interval:    QRS Duration: 88 QT Interval:  361 QTC Calculation: 417 R Axis:   41 Text Interpretation:  Sinus rhythm LAE, consider biatrial enlargement Probable anterolateral infarct, recent Minimal ST elevation, inferior leads Baseline wander in lead(s) V3 V6 No significant change since last tracing Confirmed by Isla Pence [phone]) on 06/01/2018 8:37:25 AM   Radiology Ct Abdomen Pelvis W Contrast  Result Date:  06/01/2018 CLINICAL DATA:  Lower stomach pain EXAM: CT ABDOMEN AND PELVIS WITH CONTRAST TECHNIQUE: Multidetector CT imaging of the abdomen and pelvis was performed using the standard protocol following bolus administration of intravenous contrast. CONTRAST:  139mL OMNIPAQUE IOHEXOL 300 MG/ML  SOLN COMPARISON:  03/19/2014 FINDINGS: Lower chest: Lung bases are clear. No effusions. Heart is normal size. Hepatobiliary: No focal hepatic abnormality. Gallbladder unremarkable. Pancreas: Small hypodensity in the pancreatic tail again noted. This is low-density may reflect small lipoma or cyst. No suspicious pancreatic abnormality. No ductal dilatation. Spleen: No focal abnormality.  Normal size. Adrenals/Urinary Tract: No adrenal abnormality. No focal renal abnormality. No stones or hydronephrosis.  Urinary bladder is unremarkable. Stomach/Bowel: Stomach, large and small bowel grossly unremarkable. Vascular/Lymphatic: No evidence of aneurysm or adenopathy. Scattered aortic calcifications. Reproductive: Prior hysterectomy.  No adnexal masses. Other: No free fluid or free air. Small to medium sized left inguinal hernia containing fat, similar to prior study. Musculoskeletal: No acute bony abnormality. IMPRESSION: Stable left inguinal hernia containing fat. No acute findings in the abdomen or pelvis. Electronically Signed   By: Rolm Baptise M.D.   On: 06/01/2018 11:21    Procedures Procedures (including critical care time)  Medications Ordered in ED Medications  iopamidol (ISOVUE-300) 61 % injection 100 mL (has no administration in time range)  morphine 4 MG/ML injection 4 mg (4 mg Intravenous Given 06/01/18 0852)  ondansetron (ZOFRAN) injection 4 mg (4 mg Intravenous Given 06/01/18 0847)  sodium chloride 0.9 % bolus 1,000 mL (0 mLs Intravenous Stopped 06/01/18 1040)  iohexol (OMNIPAQUE) 300 MG/ML solution 100 mL (100 mLs Intravenous Contrast Given 06/01/18 1052)     Initial Impression / Assessment and Plan / ED Course   I have reviewed the triage vital signs and the nursing notes.  Pertinent labs & imaging results that were available during my care of the patient were reviewed by me and considered in my medical decision making (see chart for details).     After pt received pain medicines, I leaned her back in bed and hernia self reduced.  The pt does not have obstruction on ct scan.  Pt is stable for d/c.  F/u with surgery.  Final Clinical Impressions(s) / ED Diagnoses   Final diagnoses:  Non-recurrent unilateral inguinal hernia without obstruction or gangrene    ED Discharge Orders    None       Isla Pence, MD 06/01/18 1133

## 2018-06-01 NOTE — ED Triage Notes (Signed)
Pt. Stated, Im having lower stomach pain started in the night. I have dizziness cause my vision is not good anymore. It makes me dizzy when I open my eyes.

## 2018-06-01 NOTE — ED Notes (Signed)
Pt given turkey sandwich and sprite.  

## 2018-06-14 ENCOUNTER — Encounter (HOSPITAL_COMMUNITY): Payer: Self-pay

## 2018-06-14 ENCOUNTER — Ambulatory Visit (HOSPITAL_COMMUNITY)
Admission: EM | Admit: 2018-06-14 | Discharge: 2018-06-14 | Disposition: A | Discharge status: Home / Self Care | Payer: Medicare Other | Attending: Family Medicine | Admitting: Family Medicine

## 2018-06-14 DIAGNOSIS — N898 Other specified noninflammatory disorders of vagina: Secondary | ICD-10-CM

## 2018-06-14 LAB — POCT URINALYSIS DIP (DEVICE)
BILIRUBIN URINE: NEGATIVE
GLUCOSE, UA: 500 mg/dL — AB
HGB URINE DIPSTICK: NEGATIVE
Ketones, ur: NEGATIVE mg/dL
LEUKOCYTES UA: NEGATIVE
Nitrite: NEGATIVE
Protein, ur: NEGATIVE mg/dL
Specific Gravity, Urine: 1.01 (ref 1.005–1.030)
UROBILINOGEN UA: 0.2 mg/dL (ref 0.0–1.0)
pH: 6.5 (ref 5.0–8.0)

## 2018-06-14 MED ORDER — CLOBETASOL PROPIONATE 0.05 % EX OINT: 1.0000 "application " | TOPICAL_OINTMENT | Freq: Two times a day (BID) | CUTANEOUS | 0 refills | Status: DC

## 2018-06-14 NOTE — ED Triage Notes (Signed)
Pt presents with vaginal irritation and itchiness

## 2018-06-14 NOTE — ED Provider Notes (Signed)
[place]    CSN: [id number] Arrival date & time: 06/14/18  1101     History   Chief Complaint Chief Complaint  Patient presents with  . vaginal irritation    HPI Allison Franco is a 77 y.o. female.   HPI  Patient is here for external genital irritation.  She states is been present off and on for a long time.  It is been worse the last couple of weeks.  She states she gets itching and burning in the outer vaginal area, this wakes her up at night.  No discharge.  No odor.  No pain.  No dysuria or frequency.  She states that she spoke with 1 of her friends who told her she needed to douche.  She bought a vinegar and water douche and this made the burning worse.  She has not had sexual relations since her husband died many years ago.  She is over 25 years postmenopausal.  She had a hysterectomy.  Denies vaginal bleeding.  Past Medical History:  Diagnosis Date  . Anxiety   . Constipation   . Depression    "following husband's death"  . Hypertension   . Tinnitus     Patient Active Problem List   Diagnosis Date Noted  . Grief reaction 10/20/2013  . Hyponatremia 10/20/2013  . Obesity 10/20/2013  . Atypical chest pain 09/01/2013  . ALLERGIC RHINITIS 08/11/2009  . KNEE PAIN, BILATERAL 02/13/2009  . COUGH 02/13/2009  . INSOMNIA 07/17/2008  . TIREDNESS 04/16/2008  . HYPERTENSION 01/24/2008  . BACK PAIN, LUMBAR 01/22/2008  . HYPERGLYCEMIA, BORDERLINE 01/27/2007    Past Surgical History:  Procedure Laterality Date  . ABDOMINAL HYSTERECTOMY    . fribroid surgery      OB History   None      Home Medications    Prior to Admission medications   Medication Sig Start Date End Date Taking? Authorizing Provider  acetaminophen (TYLENOL) 325 MG tablet Take 325-650 mg by mouth every 6 (six) hours as needed for mild pain, moderate pain or headache. Reported on 05/10/2016    [provider]  ALPRAZolam Duanne Moron) 0.25 MG tablet Take 1 tablet (0.25 mg total) by  mouth at bedtime as needed for anxiety (or ringing in ear). 02/05/18   Orpah Greek, MD  Calcium Carbonate-Vitamin D 600-400 MG-UNIT tablet Take 1 tablet by mouth daily.    [provider]  Cetirizine HCl 10 MG CAPS Take 1 capsule (10 mg total) by mouth daily. 04/17/18   Wieters, Hallie C, PA-C  diphenhydrAMINE (BENADRYL) 25 mg capsule Take 1 capsule (25 mg total) by mouth at bedtime as needed for allergies. 12/01/17   Tasia Catchings, Amy V, PA-C  EPINEPHrine (EPIPEN 2-PAK) 0.3 mg/0.3 mL IJ SOAJ injection Inject 0.3 mLs (0.3 mg total) into the muscle once as needed (for severe allergic reaction). CAll 911 immediately if you have to use this medicine 09/10/17   Larene Pickett, PA-C  fluticasone [facility]) 50 MCG/ACT nasal spray Place 2 sprays into both nostrils daily. 01/05/18   Tasia Catchings, Amy V, PA-C  ipratropium (ATROVENT) 0.06 % nasal spray Place 2 sprays into both nostrils 4 (four) times daily. 01/05/18   Tasia Catchings, Amy V, PA-C  lisinopril (PRINIVIL,ZESTRIL) 10 MG tablet Take 10 mg by mouth daily.    [provider]  zolpidem (AMBIEN) 5 MG tablet Take 1 tablet (5 mg total) by mouth at bedtime as needed for sleep. 10/31/17   Robyn Haber, MD    Family History  Family History  Problem Relation Age of Onset  . Diabetes Mother   . Hypertension Father     Social History Social History   Tobacco Use  . Smoking status: Never Smoker  . Smokeless tobacco: Never Used  Substance Use Topics  . Alcohol use: No  . Drug use: No     Allergies   Aspirin; Hydroxyzine; and Valium [diazepam]   Review of Systems Review of Systems  Constitutional: Negative for chills and fever.  HENT: Negative for ear pain and sore throat.   Eyes: Negative for pain and visual disturbance.  Respiratory: Negative for cough and shortness of breath.   Cardiovascular: Negative for chest pain and palpitations.  Gastrointestinal: Negative for abdominal pain and vomiting.  Genitourinary: Negative for dysuria, hematuria,  vaginal bleeding, vaginal discharge and vaginal pain.       Itching  Musculoskeletal: Negative for arthralgias and back pain.  Skin: Negative for color change and rash.  Neurological: Negative for seizures and syncope.  Psychiatric/Behavioral: Positive for sleep disturbance.  All other systems reviewed and are negative.    Physical Exam Triage Vital Signs ED Triage Vitals [06/14/18 1151]  Enc Vitals Group     BP 128/77     Pulse Rate 84     Resp 20     Temp 98.6 F (37 C)     Temp Source Oral     SpO2 97 %   No data found.  Updated Vital Signs BP 128/77 (BP Location: Left Arm)   Pulse 84   Temp 98.6 F (37 C) (Oral)   Resp 20   SpO2 97%      Physical Exam  Constitutional: She appears well-developed and well-nourished. No distress.  HENT:  Head: Normocephalic and atraumatic.  Mouth/Throat: Oropharynx is clear and moist.  Eyes: Pupils are equal, round, and reactive to light. Conjunctivae are normal.  Neck: Normal range of motion.  Cardiovascular: Normal rate.  Pulmonary/Chest: Effort normal. No respiratory distress.  Abdominal: Soft. She exhibits no distension.  Musculoskeletal: Normal range of motion. She exhibits no edema.  Neurological: She is alert.  Skin: Skin is warm and dry.  Psychiatric: She has a normal mood and affect. Her behavior is normal.     UC Treatments / Results  Labs (all labs ordered are listed, but only abnormal results are displayed) Labs Reviewed  POCT URINALYSIS DIP (DEVICE) - Abnormal; Notable for the following components:      Result Value   Glucose, UA 500 (*)    All other components within normal limits    EKG None  Radiology No results found.  Procedures Procedures (including critical care time)  Medications Ordered in UC Medications - No data to display  Initial Impression / Assessment and Plan / UC Course  I have reviewed the triage vital signs and the nursing notes.  Pertinent labs & imaging results that were  available during my care of the patient were reviewed by me and considered in my medical decision making (see chart for details).    Discussed postmenopausal changes.  Discussed atrophic vaginitis.  Discussed avoiding the use of douches and feminine hygiene products.  Mild soap only.  We will give her a prescription for a steroid cream to use for the itching.  She is to follow-up with her PCP for additional GYN advice. Final Clinical Impressions(s) / UC Diagnoses   Final diagnoses:  Vaginal irritation     Discharge Instructions     Do not use feminine hygiene products  such as the douche Use cream for the itching It will take a few days to work Obtain follow up care through your PCP    ED Prescriptions    None     Controlled Substance Prescriptions [place] Controlled Substance Registry consulted? Not Applicable   Raylene Everts, MD 06/14/18 [phone]

## 2018-06-14 NOTE — Discharge Instructions (Addendum)
Do not use feminine hygiene products such as the douche Use cream for the itching It will take a few days to work Obtain follow up care through your PCP

## 2018-07-02 ENCOUNTER — Encounter (HOSPITAL_COMMUNITY): Payer: Self-pay | Admitting: Urgent Care

## 2018-07-02 ENCOUNTER — Emergency Department (HOSPITAL_COMMUNITY): Payer: Medicare Other

## 2018-07-02 ENCOUNTER — Ambulatory Visit (HOSPITAL_COMMUNITY)
Admission: EM | Admit: 2018-07-02 | Discharge: 2018-07-02 | Disposition: A | Discharge status: Home / Self Care | Payer: Medicare Other | Source: Home / Self Care

## 2018-07-02 ENCOUNTER — Encounter (HOSPITAL_COMMUNITY): Payer: Self-pay

## 2018-07-02 ENCOUNTER — Inpatient Hospital Stay (HOSPITAL_COMMUNITY)
Admission: EM | Admit: 2018-07-02 | Discharge: 2018-07-05 | DRG: 638 | Disposition: A | Discharge status: Home Health | Payer: Medicare Other | Attending: Internal Medicine | Admitting: Internal Medicine

## 2018-07-02 ENCOUNTER — Other Ambulatory Visit: Payer: Self-pay

## 2018-07-02 DIAGNOSIS — Z90710 Acquired absence of both cervix and uterus: Secondary | ICD-10-CM | POA: Diagnosis not present

## 2018-07-02 DIAGNOSIS — E871 Hypo-osmolality and hyponatremia: Secondary | ICD-10-CM | POA: Diagnosis present

## 2018-07-02 DIAGNOSIS — Z7951 Long term (current) use of inhaled steroids: Secondary | ICD-10-CM | POA: Diagnosis not present

## 2018-07-02 DIAGNOSIS — E1110 Type 2 diabetes mellitus with ketoacidosis without coma: Secondary | ICD-10-CM | POA: Diagnosis not present

## 2018-07-02 DIAGNOSIS — E1165 Type 2 diabetes mellitus with hyperglycemia: Secondary | ICD-10-CM | POA: Diagnosis not present

## 2018-07-02 DIAGNOSIS — Z79899 Other long term (current) drug therapy: Secondary | ICD-10-CM

## 2018-07-02 DIAGNOSIS — I10 Essential (primary) hypertension: Secondary | ICD-10-CM | POA: Diagnosis present

## 2018-07-02 DIAGNOSIS — G4489 Other headache syndrome: Secondary | ICD-10-CM | POA: Diagnosis not present

## 2018-07-02 DIAGNOSIS — IMO0001 Reserved for inherently not codable concepts without codable children: Secondary | ICD-10-CM

## 2018-07-02 DIAGNOSIS — R030 Elevated blood-pressure reading, without diagnosis of hypertension: Secondary | ICD-10-CM

## 2018-07-02 DIAGNOSIS — Z833 Family history of diabetes mellitus: Secondary | ICD-10-CM

## 2018-07-02 DIAGNOSIS — R51 Headache: Secondary | ICD-10-CM | POA: Diagnosis not present

## 2018-07-02 DIAGNOSIS — Z888 Allergy status to other drugs, medicaments and biological substances status: Secondary | ICD-10-CM

## 2018-07-02 DIAGNOSIS — E119 Type 2 diabetes mellitus without complications: Secondary | ICD-10-CM | POA: Diagnosis present

## 2018-07-02 DIAGNOSIS — R739 Hyperglycemia, unspecified: Secondary | ICD-10-CM | POA: Diagnosis not present

## 2018-07-02 DIAGNOSIS — Z8249 Family history of ischemic heart disease and other diseases of the circulatory system: Secondary | ICD-10-CM

## 2018-07-02 DIAGNOSIS — Z886 Allergy status to analgesic agent status: Secondary | ICD-10-CM

## 2018-07-02 DIAGNOSIS — E860 Dehydration: Secondary | ICD-10-CM | POA: Diagnosis present

## 2018-07-02 DIAGNOSIS — R519 Headache, unspecified: Secondary | ICD-10-CM

## 2018-07-02 LAB — BASIC METABOLIC PANEL
Anion gap: 11 (ref 5–15)
BUN: 10 mg/dL (ref 8–23)
CHLORIDE: 92 mmol/L — AB (ref 98–111)
CO2: 24 mmol/L (ref 22–32)
CREATININE: 0.9 mg/dL (ref 0.44–1.00)
Calcium: 9.1 mg/dL (ref 8.9–10.3)
GFR calc non Af Amer: >60 mL/min (ref >60)
GLUCOSE: 456 mg/dL — AB (ref 70–99)
Potassium: 4.6 mmol/L (ref 3.5–5.1)
Sodium: 127 mmol/L — ABNORMAL LOW (ref 135–145)

## 2018-07-02 LAB — CBC WITH DIFFERENTIAL/PLATELET
Abs Immature Granulocytes: 0 10*3/uL (ref 0.0–0.1)
Basophils Absolute: 0 10*3/uL (ref 0.0–0.1)
Basophils Relative: 0 %
EOS ABS: 0.1 10*3/uL (ref 0.0–0.7)
Eosinophils Relative: 1 %
HEMATOCRIT: 46.5 % — AB (ref 36.0–46.0)
HEMOGLOBIN: 14.8 g/dL (ref 12.0–15.0)
Immature Granulocytes: 0 %
LYMPHS ABS: 1.8 10*3/uL (ref 0.7–4.0)
Lymphocytes Relative: 20 %
MCH: 26.8 pg (ref 26.0–34.0)
MCHC: 31.8 g/dL (ref 30.0–36.0)
MCV: 84.2 fL (ref 78.0–100.0)
MONO ABS: 0.7 10*3/uL (ref 0.1–1.0)
MONOS PCT: 8 %
Neutro Abs: 6.7 10*3/uL (ref 1.7–7.7)
Neutrophils Relative %: 71 %
Platelets: 233 10*3/uL (ref 150–400)
RBC: 5.52 MIL/uL — AB (ref 3.87–5.11)
RDW: 11.9 % (ref 11.5–15.5)
WBC: 9.4 10*3/uL (ref 4.0–10.5)

## 2018-07-02 MED ORDER — METOCLOPRAMIDE HCL 5 MG/ML IJ SOLN
10.0000 mg | Freq: Once | INTRAMUSCULAR | Status: AC
  Administered 2018-07-02: 10 mg via INTRAVENOUS
  Filled 2018-07-02: qty 2

## 2018-07-02 MED ORDER — SODIUM CHLORIDE 0.9 % IV BOLUS
1000.0000 mL | Freq: Once | INTRAVENOUS | Status: AC
  Administered 2018-07-02: 1000 mL via INTRAVENOUS

## 2018-07-02 MED ORDER — IBUPROFEN 400 MG PO TABS
400.0000 mg | ORAL_TABLET | Freq: Once | ORAL | Status: AC | PRN
  Administered 2018-07-02: 400 mg via ORAL
  Filled 2018-07-02: qty 1

## 2018-07-02 MED ORDER — DIPHENHYDRAMINE HCL 25 MG PO CAPS
25.0000 mg | ORAL_CAPSULE | Freq: Once | ORAL | Status: AC
  Administered 2018-07-02: 25 mg via ORAL
  Filled 2018-07-02: qty 1

## 2018-07-02 NOTE — ED Provider Notes (Signed)
MRN: [id number] DOB: 1940-12-13  Subjective:   Allison Franco is a 77 y.o. female presenting for 1 day history of severe headache with associated dizziness.  Reports that her headache is a 9-10 out of 10 in severity, has light sensitivity right-sided facial discomfort.  Patient states that she has tried Tylenol with minimal relief.  States that she had tried a new type beverage as nonalcoholic but her symptoms started thereafter.  She has a history of high blood pressure and is compliant with her medications, reports that her blood pressures generally well controlled.  Denies falls, trauma, sinus pain, sinus congestion, sore throat, cough, chest pain, shortness of breath, wheezing, nausea, vomiting, belly pain.  She reports that she has never smoked. She has never used smokeless tobacco. She reports that she does not drink alcohol or use drugs.   No current facility-administered medications for this encounter.   Current Outpatient Medications:  .  acetaminophen (TYLENOL) 325 MG tablet, Take 325-650 mg by mouth every 6 (six) hours as needed for mild pain, moderate pain or headache. Reported on 05/10/2016, Disp: , Rfl:  .  ALPRAZolam (XANAX) 0.25 MG tablet, Take 1 tablet (0.25 mg total) by mouth at bedtime as needed for anxiety (or ringing in ear)., Disp: 15 tablet, Rfl: 0 .  Calcium Carbonate-Vitamin D 600-400 MG-UNIT tablet, Take 1 tablet by mouth daily., Disp: , Rfl:  .  Cetirizine HCl 10 MG CAPS, Take 1 capsule (10 mg total) by mouth daily., Disp: 30 capsule, Rfl: 0 .  clobetasol ointment (TEMOVATE) 6.28 %, Apply 1 application topically 2 (two) times daily., Disp: 30 g, Rfl: 0 .  diphenhydrAMINE (BENADRYL) 25 mg capsule, Take 1 capsule (25 mg total) by mouth at bedtime as needed for allergies., Disp: 15 capsule, Rfl: 0 .  EPINEPHrine (EPIPEN 2-PAK) 0.3 mg/0.3 mL IJ SOAJ injection, Inject 0.3 mLs (0.3 mg total) into the muscle once as needed (for severe allergic reaction). CAll 911 immediately if you  have to use this medicine, Disp: 1 Device, Rfl: 1 .  fluticasone (FLONASE) 50 MCG/ACT nasal spray, Place 2 sprays into both nostrils daily., Disp: 1 g, Rfl: 0 .  ipratropium (ATROVENT) 0.06 % nasal spray, Place 2 sprays into both nostrils 4 (four) times daily., Disp: 15 mL, Rfl: 0 .  lisinopril (PRINIVIL,ZESTRIL) 10 MG tablet, Take 10 mg by mouth daily., Disp: , Rfl:  .  zolpidem (AMBIEN) 5 MG tablet, Take 1 tablet (5 mg total) by mouth at bedtime as needed for sleep., Disp: 30 tablet, Rfl: 0    Allergies  Allergen Reactions  . Aspirin Other (See Comments)    Allergic to Adult Aspirin. Cant sleep.   [name] Hydroxyzine Other (See Comments)    insomnia  . Valium [Diazepam] Other (See Comments)    Insomnia, 05/10/16 pt states she took as young person- made her depressed    Past Medical History:  Diagnosis Date  . Anxiety   . Constipation   . Depression    "following husband's death"  . Hypertension   . Tinnitus      Past Surgical History:  Procedure Laterality Date  . ABDOMINAL HYSTERECTOMY    . fribroid surgery      Objective:   Vitals: BP (!) 165/94 (BP Location: Left Arm)   Pulse 99   Temp 98.4 F (36.9 C) (Temporal)   Resp (!) 22   SpO2 100%   BP Readings from Last 3 Encounters:  07/02/18 (!) 165/94  06/14/18 128/77  06/01/18 129/73  Physical Exam  Constitutional: She is oriented to person, place, and time. She appears well-developed and well-nourished. She appears distressed (from her headache).  HENT:  Mouth/Throat: Oropharynx is clear and moist.  Eyes: Pupils are equal, round, and reactive to light. EOM are normal. Right eye exhibits no discharge. Left eye exhibits no discharge. No scleral icterus.  Neck: Normal range of motion. Neck supple.  Cardiovascular: Normal rate, regular rhythm, normal heart sounds and intact distal pulses. Exam reveals no gallop and no friction rub.  No murmur heard. Pulmonary/Chest: Effort normal and breath sounds normal. No stridor.  No respiratory distress. She has no wheezes. She has no rales.  Abdominal: Soft. Bowel sounds are normal. She exhibits no distension and no mass. There is no tenderness. There is no rebound and no guarding.  Musculoskeletal: She exhibits no edema.  Neurological: She is alert and oriented to person, place, and time. She displays normal reflexes. No cranial nerve deficit. Coordination normal.  Negative Romberg and pronator drift.  Speech is intact.  Strength is 5 out of 5.  Skin: Skin is warm and dry. No rash noted. She is not diaphoretic. No erythema. No pallor.   Assessment and Plan :   Bad headache  Essential hypertension  Elevated blood pressure reading  Redirected patient toward the emergency room for consideration of a head CT to rule out an intracranial process given her elevated blood pressure and severity of her headache.  Physical exam findings reassuring the patient's appearance, distress and report of very severe headache in setting of high blood pressure is very concerning to me.  Patient is in agreement with treatment plan.     Jaynee Eagles, [place] 07/02/18 1962

## 2018-07-02 NOTE — Discharge Instructions (Signed)
Please report to the [facility] ER immediately for consideration for a head CT to make sure they rule out an intracranial process given your severe headache.

## 2018-07-02 NOTE — ED Notes (Signed)
Pt came to [facility] stating she was dizzy and not feeling well. Rechecked Pt's vitals. Pt stating several times she isn't feeling well and still very dizzy. Pt ambulated to Rest[place] and back to seat without assistance.

## 2018-07-02 NOTE — ED Triage Notes (Signed)
Pt endorses headache since yesterday afternoon. Pt sent here by ucc. No neuro deficits. Pt appears anxious.

## 2018-07-02 NOTE — ED Provider Notes (Addendum)
[facility] EMERGENCY DEPARTMENT Provider Note   CSN: [id number] Arrival date & time: 07/02/18  1643  History   Chief Complaint Chief Complaint  Patient presents with  . Headache    HPI Allison Franco is a 77 y.o. female.  HPI Patient is a 77 year old woman with history of hypertension, tinnitus, anxiety depression who presents to the emergency department for evaluation of headache.  Patient reports that she was at church yesterday and developed headache after looking at the son.  Headache has been present and slowly worsened since yesterday.  9/10 at max.  Has been improving since receiving Motrin in this emergency department.  Initially went to urgent care today where she was hypertensive and advised to come to this emergency department for further evaluation and CT scan.  Patient denies any falls or traumas.  No numbness or weakness.  No nausea or vomiting.  States she has felt dizzy occasionally. No fevers or chills. Lives at home alone. No history of CVA. No CP/SOA. No neck pain. No abdominal pain.   Past Medical History:  Diagnosis Date  . Anxiety   . Constipation   . Depression    "following husband's death"  . Hypertension   . Tinnitus     Patient Active Problem List   Diagnosis Date Noted  . Uncontrolled diabetes mellitus type 2 without complications ([place]) 10/93/2355  . Grief reaction 10/20/2013  . Hyponatremia 10/20/2013  . Obesity 10/20/2013  . Atypical chest pain 09/01/2013  . ALLERGIC RHINITIS 08/11/2009  . KNEE PAIN, BILATERAL 02/13/2009  . COUGH 02/13/2009  . INSOMNIA 07/17/2008  . TIREDNESS 04/16/2008  . HYPERTENSION 01/24/2008  . BACK PAIN, LUMBAR 01/22/2008  . HYPERGLYCEMIA, BORDERLINE 01/27/2007    Past Surgical History:  Procedure Laterality Date  . ABDOMINAL HYSTERECTOMY    . fribroid surgery       OB History   None      Home Medications    Prior to Admission medications   Medication Sig Start Date End Date Taking?  Authorizing Provider  acetaminophen (TYLENOL) 325 MG tablet Take 325-650 mg by mouth every 6 (six) hours as needed for mild pain, moderate pain or headache. Reported on 05/10/2016   Yes [provider]  ALPRAZolam (XANAX) 0.25 MG tablet Take 1 tablet (0.25 mg total) by mouth at bedtime as needed for anxiety (or ringing in ear). 02/05/18  Yes Pollina, Gwenyth Allegra, MD  clobetasol ointment (TEMOVATE) 7.32 % Apply 1 application topically 2 (two) times daily. Patient taking differently: Apply 1 application topically 2 (two) times daily as needed (dermatosis).  06/14/18  Yes Raylene Everts, MD  diphenhydrAMINE (BENADRYL) 25 mg capsule Take 1 capsule (25 mg total) by mouth at bedtime as needed for allergies. 12/01/17  Yes Yu, Amy V, PA-C  EPINEPHrine (EPIPEN 2-PAK) 0.3 mg/0.3 mL IJ SOAJ injection Inject 0.3 mLs (0.3 mg total) into the muscle once as needed (for severe allergic reaction). CAll 911 immediately if you have to use this medicine 09/10/17  Yes Baird Cancer, Vilinda Blanks, PA-C  fluticasone [facility]) 50 MCG/ACT nasal spray Place 2 sprays into both nostrils daily. Patient taking differently: Place 2 sprays into both nostrils daily as needed for allergies.  01/05/18  Yes Yu, Amy V, PA-C  lisinopril (PRINIVIL,ZESTRIL) 10 MG tablet Take 10 mg by mouth daily.   Yes [provider]  Multiple Vitamin (MULTIVITAMIN WITH MINERALS) TABS tablet Take 1 tablet by mouth daily.   Yes [provider]  simvastatin (ZOCOR) 10 MG tablet  Take 10 mg by mouth daily.   Yes [provider]  zolpidem (AMBIEN) 5 MG tablet Take 1 tablet (5 mg total) by mouth at bedtime as needed for sleep. 10/31/17  Yes Robyn Haber, MD  Cetirizine HCl 10 MG CAPS Take 1 capsule (10 mg total) by mouth daily. Patient not taking: Reported on 07/02/2018 04/17/18   Wieters, Hallie C, PA-C  ipratropium (ATROVENT) 0.06 % nasal spray Place 2 sprays into both nostrils 4 (four) times daily. Patient not taking: Reported on  07/02/2018 01/05/18   Arturo Morton    Family History Family History  Problem Relation Age of Onset  . Diabetes Mother   . Hypertension Father     Social History Social History   Tobacco Use  . Smoking status: Never Smoker  . Smokeless tobacco: Never Used  Substance Use Topics  . Alcohol use: No  . Drug use: No     Allergies   Aspirin; Hydroxyzine; and Valium [diazepam]   Review of Systems Review of Systems  Constitutional: Negative for chills and fever.  HENT: Negative for congestion and sore throat.   Eyes: Negative for visual disturbance.  Respiratory: Negative for cough and shortness of breath.   Cardiovascular: Negative for chest pain and leg swelling.  Gastrointestinal: Negative for abdominal pain, diarrhea, nausea and vomiting.  Genitourinary: Negative for dysuria and hematuria.  Musculoskeletal: Negative for back pain and neck pain.  Skin: Negative for color change and rash.  Neurological: Positive for dizziness and headaches. Negative for weakness.  All other systems reviewed and are negative.    Physical Exam Updated Vital Signs BP (!) 143/84 (BP Location: Left Arm)   Pulse 78   Temp 98.4 F (36.9 C) (Oral)   Resp 18   Ht 5\' 3"  (1.6 m)   Wt 84.4 kg (186 lb)   SpO2 100%   BMI 32.95 kg/m   Physical Exam  Constitutional: She appears well-developed and well-nourished. No distress.  HENT:  Head: Normocephalic and atraumatic.  Eyes: Pupils are equal, round, and reactive to light. Conjunctivae and EOM are normal.  86mm reactive bilat. No nystagmus. No pain over temporal regions.   Neck: Neck supple.  Cardiovascular: Regular rhythm and intact distal pulses.  Pulmonary/Chest: Effort normal and breath sounds normal. No respiratory distress.  Abdominal: Soft. She exhibits no distension. There is no tenderness.  Musculoskeletal: She exhibits no edema.  Neurological: She is alert.  Alert and oriented x3.  Cranial nerves II-XII intact.  No facial asymmetry.   5/5 grip strength bilaterally.  5/5 bicep flexion and tricep extension bilaterally. 5/5 flexion/extension at knee, hip, and ankles. No discoordination of FNF, heel to shin, and ambulates without ataxia or antalgic gait.    Skin: Skin is warm and dry. Capillary refill takes 2 to 3 seconds.  Psychiatric: She has a normal mood and affect.  Nursing note and vitals reviewed.    ED Treatments / Results  Labs (all labs ordered are listed, but only abnormal results are displayed) Labs Reviewed  CBC WITH DIFFERENTIAL/PLATELET - Abnormal; Notable for the following components:      Result Value   RBC 5.52 (*)    HCT 46.5 (*)    All other components within normal limits  BASIC METABOLIC PANEL - Abnormal; Notable for the following components:   Sodium 127 (*)    Chloride 92 (*)    Glucose, Bld 456 (*)    All other components within normal limits    EKG None  Radiology Ct Head Wo Contrast  Result Date: 07/02/2018 CLINICAL DATA:  Headache since yesterday EXAM: CT HEAD WITHOUT CONTRAST TECHNIQUE: Contiguous axial images were obtained from the base of the skull through the vertex without intravenous contrast. COMPARISON:  05/16/2016.  05/04/2016 FINDINGS: Brain: No mass effect, midline shift, or acute intracranial hemorrhage. Mild atrophy appropriate to age. Minimal chronic ischemic changes in the periventricular white matter of the frontal lobes. Vascular: No hyperdense vessel or unexpected calcification. Skull: Intact. Sinuses/Orbits: Mastoid air cells are clear. Visualized paranasal sinuses are clear. Orbits are within normal limits. Other: Noncontributory. IMPRESSION: No acute intracranial pathology Electronically Signed   By: Marybelle Killings M.D.   On: 07/02/2018 17:42    Procedures Procedures (including critical care time)  Medications Ordered in ED Medications  sodium chloride 0.9 % bolus 1,000 mL (1,000 mLs Intravenous New Bag/Given 07/02/18 2341)  ibuprofen (ADVIL,MOTRIN) tablet 400 mg (400  mg Oral Given 07/02/18 1719)  metoCLOPramide (REGLAN) injection 10 mg (10 mg Intravenous Given 07/02/18 2217)  diphenhydrAMINE (BENADRYL) capsule 25 mg (25 mg Oral Given 07/02/18 2217)     Initial Impression / Assessment and Plan / ED Course  I have reviewed the triage vital signs and the nursing notes.  Pertinent labs & imaging results that were available during my care of the patient were reviewed by me and considered in my medical decision making (see chart for details).    Patient is a 77 year old woman with history of hypertension, tinnitus, anxiety depression who presents to the emergency department for evaluation of headache.   Patient afebrile, hypertensive at presentation.  Exam as above.  No focal neurological deficits.  No nystagmus.  Ambulates without difficulty or ataxia. [place] with NAICA. No mass. No falls or traumas. No neck pain to suggest dissection of carotid/vertebrals. No thunderclap or meningismus or findings of SAH. CBC and BMP obtained. No AKI or thrombocytopenia and no CP/SOA to suggest hypertensive emergency. BMP remarkable for hyperglycemia >450 with pseudohyponatremia and stable renal function. Review of records shows one prior hyperglycemia >300 in early July. Pt denies history diabetes. States she will start drinking a special juice to cure her diabetes. Clearly has poor insight into new diagnosis of diabetes and feel she requires admission for further education and treatment. No acidosis or AGAP. Not DKA. A&Ox4 not consistent with HHS. Spoke with medicine team who will admit for further management. HA better after ibuprofen, reglan, benadryl. Eating in room. Evaluated by medicine team. Stable in ED w/no acute events. Will give IVFs and reassess blood glucose before initiating diabetes meds/insulin.   Case and plan of care discussed with Dr. Kathrynn Humble.   Final Clinical Impressions(s) / ED Diagnoses   Final diagnoses:  Other headache syndrome  Hyperglycemia    ED Discharge  Orders    None       Corrie Dandy, MD 07/03/18 [id number]    Varney Biles, MD 07/04/18 1239

## 2018-07-02 NOTE — ED Triage Notes (Signed)
Pt presents with ongoing severe headache

## 2018-07-03 ENCOUNTER — Encounter (HOSPITAL_COMMUNITY): Payer: Self-pay | Admitting: Internal Medicine

## 2018-07-03 ENCOUNTER — Other Ambulatory Visit: Payer: Self-pay

## 2018-07-03 DIAGNOSIS — E1165 Type 2 diabetes mellitus with hyperglycemia: Secondary | ICD-10-CM | POA: Diagnosis not present

## 2018-07-03 DIAGNOSIS — G4489 Other headache syndrome: Secondary | ICD-10-CM | POA: Diagnosis not present

## 2018-07-03 DIAGNOSIS — I10 Essential (primary) hypertension: Secondary | ICD-10-CM | POA: Diagnosis not present

## 2018-07-03 DIAGNOSIS — E871 Hypo-osmolality and hyponatremia: Secondary | ICD-10-CM | POA: Diagnosis not present

## 2018-07-03 LAB — CBC WITH DIFFERENTIAL/PLATELET
Abs Immature Granulocytes: 0 10*3/uL (ref 0.0–0.1)
BASOS ABS: 0 10*3/uL (ref 0.0–0.1)
Basophils Relative: 0 %
EOS PCT: 1 %
Eosinophils Absolute: 0.1 10*3/uL (ref 0.0–0.7)
HEMATOCRIT: 41 % (ref 36.0–46.0)
HEMOGLOBIN: 13.3 g/dL (ref 12.0–15.0)
IMMATURE GRANULOCYTES: 0 %
LYMPHS ABS: 2 10*3/uL (ref 0.7–4.0)
LYMPHS PCT: 27 %
MCH: 26.9 pg (ref 26.0–34.0)
MCHC: 32.4 g/dL (ref 30.0–36.0)
MCV: 82.8 fL (ref 78.0–100.0)
MONOS PCT: 8 %
Monocytes Absolute: 0.6 10*3/uL (ref 0.1–1.0)
Neutro Abs: 4.9 10*3/uL (ref 1.7–7.7)
Neutrophils Relative %: 64 %
Platelets: 220 10*3/uL (ref 150–400)
RBC: 4.95 MIL/uL (ref 3.87–5.11)
RDW: 12 % (ref 11.5–15.5)
WBC: 7.6 10*3/uL (ref 4.0–10.5)

## 2018-07-03 LAB — GLUCOSE, CAPILLARY
GLUCOSE-CAPILLARY: 339 mg/dL — AB (ref 70–99)
GLUCOSE-CAPILLARY: 284 mg/dL — AB (ref 70–99)
Glucose-Capillary: 298 mg/dL — ABNORMAL HIGH (ref 70–99)
Glucose-Capillary: 340 mg/dL — ABNORMAL HIGH (ref 70–99)
Glucose-Capillary: 273 mg/dL — ABNORMAL HIGH (ref 70–99)
Glucose-Capillary: 440 mg/dL — ABNORMAL HIGH (ref 70–99)

## 2018-07-03 LAB — HEPATIC FUNCTION PANEL
ALBUMIN: 2.9 g/dL — AB (ref 3.5–5.0)
ALK PHOS: 95 U/L (ref 38–126)
ALT: 23 U/L (ref 0–44)
AST: 16 U/L (ref 15–41)
BILIRUBIN TOTAL: 1.1 mg/dL (ref 0.3–1.2)
Bilirubin, Direct: 0.1 mg/dL (ref 0.0–0.2)
Indirect Bilirubin: 1 mg/dL — ABNORMAL HIGH (ref 0.3–0.9)
Total Protein: 5.7 g/dL — ABNORMAL LOW (ref 6.5–8.1)

## 2018-07-03 LAB — HEMOGLOBIN A1C
Hgb A1c MFr Bld: 15.7 % — ABNORMAL HIGH (ref 4.8–5.6)
Mean Plasma Glucose: 403.89 mg/dL

## 2018-07-03 LAB — BASIC METABOLIC PANEL
Anion gap: 8 (ref 5–15)
BUN: 9 mg/dL (ref 8–23)
CHLORIDE: 97 mmol/L — AB (ref 98–111)
CO2: 26 mmol/L (ref 22–32)
CREATININE: 0.77 mg/dL (ref 0.44–1.00)
Calcium: 8.4 mg/dL — ABNORMAL LOW (ref 8.9–10.3)
GFR calc Af Amer: >60 mL/min (ref >60)
GFR calc non Af Amer: >60 mL/min (ref >60)
GLUCOSE: 384 mg/dL — AB (ref 70–99)
Potassium: 4 mmol/L (ref 3.5–5.1)
Sodium: 131 mmol/L — ABNORMAL LOW (ref 135–145)

## 2018-07-03 LAB — SEDIMENTATION RATE: SED RATE: 12 mm/h (ref 0–22)

## 2018-07-03 MED ORDER — INSULIN STARTER KIT- SYRINGES (ENGLISH)
1.0000 | Freq: Once | Status: DC
  Filled 2018-07-03: qty 1

## 2018-07-03 MED ORDER — ENOXAPARIN SODIUM 40 MG/0.4ML ~~LOC~~ SOLN
40.0000 mg | SUBCUTANEOUS | Status: DC
  Administered 2018-07-03 – 2018-07-05 (×3): 40 mg via SUBCUTANEOUS
  Filled 2018-07-03 (×3): qty 0.4

## 2018-07-03 MED ORDER — INSULIN GLARGINE 100 UNIT/ML ~~LOC~~ SOLN
15.0000 [IU] | Freq: Every day | SUBCUTANEOUS | Status: DC
  Administered 2018-07-04 – 2018-07-05 (×2): 15 [IU] via SUBCUTANEOUS
  Filled 2018-07-03 (×3): qty 0.15

## 2018-07-03 MED ORDER — LIVING WELL WITH DIABETES BOOK
Freq: Once | Status: AC
  Administered 2018-07-03: 18:00:00
  Filled 2018-07-03: qty 1

## 2018-07-03 MED ORDER — INSULIN ASPART 100 UNIT/ML ~~LOC~~ SOLN
0.0000 [IU] | Freq: Three times a day (TID) | SUBCUTANEOUS | Status: DC
  Administered 2018-07-03 (×2): 5 [IU] via SUBCUTANEOUS
  Administered 2018-07-03: 7 [IU] via SUBCUTANEOUS
  Administered 2018-07-04: 9 [IU] via SUBCUTANEOUS
  Administered 2018-07-04 – 2018-07-05 (×3): 7 [IU] via SUBCUTANEOUS
  Administered 2018-07-05: 5 [IU] via SUBCUTANEOUS

## 2018-07-03 MED ORDER — INSULIN ASPART 100 UNIT/ML ~~LOC~~ SOLN
10.0000 [IU] | Freq: Once | SUBCUTANEOUS | Status: AC
  Administered 2018-07-03: 10 [IU] via SUBCUTANEOUS

## 2018-07-03 MED ORDER — ONDANSETRON HCL 4 MG/2ML IJ SOLN: 4.0000 mg | Freq: Four times a day (QID) | INTRAMUSCULAR | Status: DC | PRN

## 2018-07-03 MED ORDER — ACETAMINOPHEN 325 MG PO TABS
650.0000 mg | ORAL_TABLET | Freq: Four times a day (QID) | ORAL | Status: DC | PRN
  Administered 2018-07-03 – 2018-07-04 (×2): 650 mg via ORAL
  Filled 2018-07-03 (×2): qty 2

## 2018-07-03 MED ORDER — ONDANSETRON HCL 4 MG PO TABS
4.0000 mg | ORAL_TABLET | Freq: Four times a day (QID) | ORAL | Status: DC | PRN
  Administered 2018-07-03: 4 mg via ORAL
  Filled 2018-07-03: qty 1

## 2018-07-03 MED ORDER — SODIUM CHLORIDE 0.9 % IV SOLN
INTRAVENOUS | Status: AC
  Administered 2018-07-03: 05:00:00 via INTRAVENOUS

## 2018-07-03 MED ORDER — INSULIN ASPART 100 UNIT/ML ~~LOC~~ SOLN
2.0000 [IU] | Freq: Once | SUBCUTANEOUS | Status: AC
  Administered 2018-07-03: 2 [IU] via SUBCUTANEOUS

## 2018-07-03 MED ORDER — LISINOPRIL 10 MG PO TABS
10.0000 mg | ORAL_TABLET | Freq: Every day | ORAL | Status: DC
  Administered 2018-07-03 – 2018-07-05 (×3): 10 mg via ORAL
  Filled 2018-07-03 (×3): qty 1

## 2018-07-03 MED ORDER — ACETAMINOPHEN 650 MG RE SUPP: 650.0000 mg | Freq: Four times a day (QID) | RECTAL | Status: DC | PRN

## 2018-07-03 NOTE — Progress Notes (Signed)
The patient was admitted early this morning after midnight and H&P has been reviewed and I am in current agreement with assessment and plan done by Dr. Gean Birchwood.  Changes the plan of care been made accordingly.  The patient is a 77 year old female with past medical history significant for hypertension, anxiety and depression, constipation, history of tenderness and other comorbidities who presents with a chief complaint of a headache.  Headache was in the right parietotemporal area and she denied any visual symptoms but she had been recently noticing increased urination and thirst.  In the ED she was given Reglan and Benadryl and had a head CT which is unremarkable.  She is found to have a blood sugar greater than 400 and hemoglobin A1c was obtained it was 15.7.  General she was diabetic and states that she has been drinking a lot of Goya drinks.  With severe hyperglycemia with new onset diabetes mellitus type 2 and placed on IV fluid hydration and sliding scale.  Diabetes education coordinator was consulted for further evaluation recommendations and she was admitted under observation as she needs diabetes education and because she was a high risk for going into DKA.  We will see patient on subcu Lantus 15 units and follow-up on diabetes education coordinator recommendations.  Patient will likely need insulin at discharge and will need diabetes teaching.  We will continue to monitor patient's clinical sponsor intervention and repeat blood work in the a.m.

## 2018-07-03 NOTE — H&P (Signed)
History and Physical    Allison Franco DXI:[id number] DOB: [birth year] DOA: 07/02/2018  PCP: [name]  Patient coming from: Home.  Chief Complaint: Headache.  HPI: Allison Franco is a 77 y.o. female with history of hypertension presents to the ER with complaints of headache which started off yesterday afternoon.  Headache is mostly in the right parietotemporal area.  Denies any visual symptoms or any weakness of the extremities.  Pain was acute and severe with no associated vomiting.  Patient denies having any history of migraine.  Recently patient has been noticing increasing urination and thirst.  ED Course: In the ER CT head was unremarkable patient was given Reglan and Benadryl following patient's headache has largely resolved.  Patient blood sugar was found to be more than 400 with no anion gap.  Urine does not show any ketones.  Patient finds it difficult to understand diabetes mellitus in the management so admitted for further management given the very elevated blood sugar and that is to go into DKA.  Labs also show hyponatremia likely from hyperglycemia and dehydration.    Review of Systems: As per HPI, rest all negative.   Past Medical History:  Diagnosis Date  . Anxiety   . Constipation   . Depression    "following husband's death"  . Hypertension   . Tinnitus     Past Surgical History:  Procedure Laterality Date  . ABDOMINAL HYSTERECTOMY    . fribroid surgery       reports that she has never smoked. She has never used smokeless tobacco. She reports that she does not drink alcohol or use drugs.  Allergies  Allergen Reactions  . Aspirin Other (See Comments)    Allergic to Adult Aspirin. Cant sleep.   [name] Hydroxyzine Other (See Comments)    insomnia  . Valium [Diazepam] Other (See Comments)    Insomnia, 05/10/16 pt states she took as young person- made her depressed    Family History  Problem Relation Age of Onset  . Diabetes Mother   . Hypertension Father      Prior to Admission medications   Medication Sig Start Date End Date Taking? Authorizing Provider  acetaminophen (TYLENOL) 325 MG tablet Take 325-650 mg by mouth every 6 (six) hours as needed for mild pain, moderate pain or headache. Reported on 05/10/2016   Yes [provider]  ALPRAZolam (XANAX) 0.25 MG tablet Take 1 tablet (0.25 mg total) by mouth at bedtime as needed for anxiety (or ringing in ear). 02/05/18  Yes Pollina, Gwenyth Allegra, MD  clobetasol ointment (TEMOVATE) 7.67 % Apply 1 application topically 2 (two) times daily. Patient taking differently: Apply 1 application topically 2 (two) times daily as needed (dermatosis).  06/14/18  Yes Raylene Everts, MD  diphenhydrAMINE (BENADRYL) 25 mg capsule Take 1 capsule (25 mg total) by mouth at bedtime as needed for allergies. 12/01/17  Yes Yu, Amy V, PA-C  EPINEPHrine (EPIPEN 2-PAK) 0.3 mg/0.3 mL IJ SOAJ injection Inject 0.3 mLs (0.3 mg total) into the muscle once as needed (for severe allergic reaction). CAll 911 immediately if you have to use this medicine 09/10/17  Yes Baird Cancer, Vilinda Blanks, PA-C  fluticasone [facility]) 50 MCG/ACT nasal spray Place 2 sprays into both nostrils daily. Patient taking differently: Place 2 sprays into both nostrils daily as needed for allergies.  01/05/18  Yes Yu, Amy V, PA-C  lisinopril (PRINIVIL,ZESTRIL) 10 MG tablet Take 10 mg by mouth daily.   Yes [provider]  Multiple Vitamin (MULTIVITAMIN  WITH MINERALS) TABS tablet Take 1 tablet by mouth daily.   Yes [provider]  simvastatin (ZOCOR) 10 MG tablet Take 10 mg by mouth daily.   Yes [provider]  zolpidem (AMBIEN) 5 MG tablet Take 1 tablet (5 mg total) by mouth at bedtime as needed for sleep. 10/31/17  Yes Robyn Haber, MD  Cetirizine HCl 10 MG CAPS Take 1 capsule (10 mg total) by mouth daily. Patient not taking: Reported on 07/02/2018 04/17/18   Wieters, Hallie C, PA-C  ipratropium (ATROVENT) 0.06 % nasal spray Place 2  sprays into both nostrils 4 (four) times daily. Patient not taking: Reported on 07/02/2018 01/05/18   Ok Edwards, PA-C    Physical Exam: Vitals:   07/03/18 0022 07/03/18 0023 07/03/18 0024 07/03/18 0046  BP:    133/76  Pulse:    85  Resp: (!) 23 (!) 21 19 18   Temp:    98.9 F (37.2 C)  TempSrc:    Oral  SpO2:    99%  Weight:    78.6 kg (173 lb 4.5 oz)  Height:    5\' 3"  (1.6 m)      Constitutional: Moderately built and nourished. Vitals:   07/03/18 0022 07/03/18 0023 07/03/18 0024 07/03/18 0046  BP:    133/76  Pulse:    85  Resp: (!) 23 (!) 21 19 18   Temp:    98.9 F (37.2 C)  TempSrc:    Oral  SpO2:    99%  Weight:    78.6 kg (173 lb 4.5 oz)  Height:    5\' 3"  (1.6 m)   Eyes: Anicteric no pallor. ENMT: No discharge from the ears eyes nose or mouth. Neck: No neck rigidity no JVD appreciated.  Respiratory: No rhonchi or crepitations. Cardiovascular: S1-S2 heard no murmurs appreciated. Abdomen: Soft nontender bowel sounds present. Musculoskeletal: No edema.  No joint effusion. Skin: No rash. Neurologic: Alert awake oriented to time place and person.  Moves all extremities 5 x 5. Psychiatric: Appears normal per normal affect.   Labs on Admission: I have personally reviewed following labs and imaging studies  CBC: Recent Labs  Lab 07/02/18 2203  WBC 9.4  NEUTROABS 6.7  HGB 14.8  HCT 46.5*  MCV 84.2  PLT 951   Basic Metabolic Panel: Recent Labs  Lab 07/02/18 2203  NA 127*  K 4.6  CL 92*  CO2 24  GLUCOSE 456*  BUN 10  CREATININE 0.90  CALCIUM 9.1   GFR: Estimated Creatinine Clearance: 52.8 mL/min (by C-G formula based on SCr of 0.9 mg/dL). Liver Function Tests: No results for input(s): AST, ALT, ALKPHOS, BILITOT, PROT, ALBUMIN in the last 168 hours. No results for input(s): LIPASE, AMYLASE in the last 168 hours. No results for input(s): AMMONIA in the last 168 hours. Coagulation Profile: No results for input(s): INR, PROTIME in the last 168  hours. Cardiac Enzymes: No results for input(s): CKTOTAL, CKMB, CKMBINDEX, TROPONINI in the last 168 hours. BNP (last 3 results) No results for input(s): PROBNP in the last 8760 hours. HbA1C: No results for input(s): HGBA1C in the last 72 hours. CBG: No results for input(s): GLUCAP in the last 168 hours. Lipid Profile: No results for input(s): CHOL, HDL, LDLCALC, TRIG, CHOLHDL, LDLDIRECT in the last 72 hours. Thyroid Function Tests: No results for input(s): TSH, T4TOTAL, FREET4, T3FREE, THYROIDAB in the last 72 hours. Anemia Panel: No results for input(s): VITAMINB12, FOLATE, FERRITIN, TIBC, IRON, RETICCTPCT in the last 72 hours. Urine analysis:  Component Value Date/Time   COLORURINE STRAW (A) 06/01/2018 0950   APPEARANCEUR CLEAR 06/01/2018 0950   LABSPEC 1.010 06/14/2018 1216   PHURINE 6.5 06/14/2018 1216   GLUCOSEU 500 (A) 06/14/2018 1216   HGBUR NEGATIVE 06/14/2018 1216   HGBUR negative 07/17/2008 0912   BILIRUBINUR NEGATIVE 06/14/2018 1216   KETONESUR NEGATIVE 06/14/2018 1216   PROTEINUR NEGATIVE 06/14/2018 1216   UROBILINOGEN 0.2 06/14/2018 1216   NITRITE NEGATIVE 06/14/2018 1216   LEUKOCYTESUR NEGATIVE 06/14/2018 1216   Sepsis Labs: @LABRCNTIP (procalcitonin:4,lacticidven:4) )No results found for this or any previous visit (from the past 240 hour(s)).   Radiological Exams on Admission: Ct Head Wo Contrast  Result Date: 07/02/2018 CLINICAL DATA:  Headache since yesterday EXAM: CT HEAD WITHOUT CONTRAST TECHNIQUE: Contiguous axial images were obtained from the base of the skull through the vertex without intravenous contrast. COMPARISON:  05/16/2016.  05/04/2016 FINDINGS: Brain: No mass effect, midline shift, or acute intracranial hemorrhage. Mild atrophy appropriate to age. Minimal chronic ischemic changes in the periventricular white matter of the frontal lobes. Vascular: No hyperdense vessel or unexpected calcification. Skull: Intact. Sinuses/Orbits: Mastoid air cells  are clear. Visualized paranasal sinuses are clear. Orbits are within normal limits. Other: Noncontributory. IMPRESSION: No acute intracranial pathology Electronically Signed   By: Marybelle Killings M.D.   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 Severe hyperglycemia with new onset diabetes mellitus type 2 -patient is not clear understanding the management of diabetes and discharged is high risk for patient to get into DKA.  We will continue to hydrate and keep patient on sliding scale for now and check hemoglobin A1c.  Follow metabolic panel diabetes coordinator education requested.  Patient probably can be discharged home on metformin if further labs does not show any worsening of anion gap. 2. Headache -appears to be new onset.  Resolved with Reglan and Benadryl.  CT head unremarkable.  Given the age and location will check sed rate to rule out any temporal arteritis.  Denies any visual symptoms.  No tender spots on exam. 3. Hypertension on lisinopril.   DVT prophylaxis: Lovenox. Code Status: Full code. Family Communication: Discussed with patient. Disposition Plan: Home. Consults called: None. Admission status: Observation.   Rise Patience MD [facility] Pager [phone].  If 7PM-7AM, please contact night-coverage [email or web address] Password [email or web address]  07/03/2018, 3:35 AM

## 2018-07-03 NOTE — Progress Notes (Signed)
Attempted to self administer lantus per diabetic teacher, pt inserted needle then pulled out and discharged insulin. Patient educated again on correct administration technique. MD notified.

## 2018-07-03 NOTE — Progress Notes (Addendum)
Inpatient Diabetes Program Recommendations  AACE/ADA: Consensus Statement on Inpatient Glycemic Control (2015)  Target Ranges:  Prepandial:   less than 140 mg/dL      Peak postprandial:   less than 180 mg/dL (1-2 hours)      Critically ill patients:  140 - 180 mg/dL    Results for Allison Franco, Allison Franco (MRN [id number]) as of 07/03/2018 13:39  Ref. Range 07/03/2018 04:05 07/03/2018 07:02 07/03/2018 11:32  Glucose-Capillary Latest Ref Range: 70 - 99 mg/dL 339 (H) 298 (H) 340 (H)    Admit with severe headache/ new DM2 diagnosis.   Diabetes history: none Home diabetes meds: none Current med orders : Sensitive correction scale (0-9 units) and starting Lantus 15 u daily.   Spoke with patient today regarding basic diabetes information. She was very appreciative and asked appropriate questions. Patient gave herself insulin dose at lunch. Much more practice needed as patient required maximum direction during first injection. Staff will have patient practice checking CBGs and continue to self administer insulin. Diabetes book and videos ordered to continue with education. Dietician consult ordered.   Read RN note when patient attempted to self administer Lantus dose this afternoon patient removed needle too quickly. RN unsure how much (if any) Lantus patient received. MD notified of above.   MD please consider the following:  Unsure patient will be able to administer multiple daily insulin injections.  Based on A1c of 15.7 recommend Lantus daily insulin plus two oral diabetes meds for discharge.   Recommend: Lantus 25 units daily based on weight (0.3 units/kg).  Metformin 500mg  BID.  Tradjenta 5mg  daily  Thank you.   -- Will follow during hospitalization.--  Jonna Clark RN, MSN Diabetes Coordinator Inpatient Glycemic Control Team Team Pager: [phone] (8am-5pm)

## 2018-07-04 DIAGNOSIS — Z888 Allergy status to other drugs, medicaments and biological substances status: Secondary | ICD-10-CM | POA: Diagnosis not present

## 2018-07-04 DIAGNOSIS — E871 Hypo-osmolality and hyponatremia: Secondary | ICD-10-CM | POA: Diagnosis present

## 2018-07-04 DIAGNOSIS — E1110 Type 2 diabetes mellitus with ketoacidosis without coma: Secondary | ICD-10-CM | POA: Diagnosis present

## 2018-07-04 DIAGNOSIS — I10 Essential (primary) hypertension: Secondary | ICD-10-CM | POA: Diagnosis present

## 2018-07-04 DIAGNOSIS — R739 Hyperglycemia, unspecified: Secondary | ICD-10-CM | POA: Diagnosis not present

## 2018-07-04 DIAGNOSIS — G4489 Other headache syndrome: Secondary | ICD-10-CM | POA: Diagnosis present

## 2018-07-04 DIAGNOSIS — Z886 Allergy status to analgesic agent status: Secondary | ICD-10-CM | POA: Diagnosis not present

## 2018-07-04 DIAGNOSIS — Z79899 Other long term (current) drug therapy: Secondary | ICD-10-CM | POA: Diagnosis not present

## 2018-07-04 DIAGNOSIS — E1165 Type 2 diabetes mellitus with hyperglycemia: Secondary | ICD-10-CM | POA: Diagnosis not present

## 2018-07-04 DIAGNOSIS — Z8249 Family history of ischemic heart disease and other diseases of the circulatory system: Secondary | ICD-10-CM | POA: Diagnosis not present

## 2018-07-04 DIAGNOSIS — E860 Dehydration: Secondary | ICD-10-CM | POA: Diagnosis present

## 2018-07-04 DIAGNOSIS — Z90710 Acquired absence of both cervix and uterus: Secondary | ICD-10-CM | POA: Diagnosis not present

## 2018-07-04 DIAGNOSIS — Z7951 Long term (current) use of inhaled steroids: Secondary | ICD-10-CM | POA: Diagnosis not present

## 2018-07-04 DIAGNOSIS — Z833 Family history of diabetes mellitus: Secondary | ICD-10-CM | POA: Diagnosis not present

## 2018-07-04 LAB — COMPREHENSIVE METABOLIC PANEL
ALBUMIN: 2.8 g/dL — AB (ref 3.5–5.0)
ALK PHOS: 89 U/L (ref 38–126)
ALT: 23 U/L (ref 0–44)
ANION GAP: 9 (ref 5–15)
AST: 20 U/L (ref 15–41)
BILIRUBIN TOTAL: 0.9 mg/dL (ref 0.3–1.2)
BUN: 12 mg/dL (ref 8–23)
CALCIUM: 8.4 mg/dL — AB (ref 8.9–10.3)
CO2: 25 mmol/L (ref 22–32)
Chloride: 99 mmol/L (ref 98–111)
Creatinine, Ser: 0.7 mg/dL (ref 0.44–1.00)
GFR calc Af Amer: >60 mL/min (ref >60)
GFR calc non Af Amer: >60 mL/min (ref >60)
GLUCOSE: 354 mg/dL — AB (ref 70–99)
POTASSIUM: 3.9 mmol/L (ref 3.5–5.1)
SODIUM: 133 mmol/L — AB (ref 135–145)
TOTAL PROTEIN: 5.5 g/dL — AB (ref 6.5–8.1)

## 2018-07-04 LAB — CBC WITH DIFFERENTIAL/PLATELET
Abs Immature Granulocytes: 0 10*3/uL (ref 0.0–0.1)
BASOS ABS: 0.1 10*3/uL (ref 0.0–0.1)
Basophils Relative: 1 %
EOS ABS: 0.2 10*3/uL (ref 0.0–0.7)
EOS PCT: 2 %
HEMATOCRIT: 40.2 % (ref 36.0–46.0)
Hemoglobin: 12.9 g/dL (ref 12.0–15.0)
Immature Granulocytes: 0 %
Lymphocytes Relative: 23 %
Lymphs Abs: 1.7 10*3/uL (ref 0.7–4.0)
MCH: 26.9 pg (ref 26.0–34.0)
MCHC: 32.1 g/dL (ref 30.0–36.0)
MCV: 83.8 fL (ref 78.0–100.0)
MONO ABS: 0.6 10*3/uL (ref 0.1–1.0)
Monocytes Relative: 9 %
Neutro Abs: 5 10*3/uL (ref 1.7–7.7)
Neutrophils Relative %: 65 %
Platelets: 203 10*3/uL (ref 150–400)
RBC: 4.8 MIL/uL (ref 3.87–5.11)
RDW: 12 % (ref 11.5–15.5)
WBC: 7.5 10*3/uL (ref 4.0–10.5)

## 2018-07-04 LAB — PHOSPHORUS: PHOSPHORUS: 3.8 mg/dL (ref 2.5–4.6)

## 2018-07-04 LAB — GLUCOSE, CAPILLARY
GLUCOSE-CAPILLARY: 304 mg/dL — AB (ref 70–99)
GLUCOSE-CAPILLARY: 282 mg/dL — AB (ref 70–99)
Glucose-Capillary: 325 mg/dL — ABNORMAL HIGH (ref 70–99)
Glucose-Capillary: 374 mg/dL — ABNORMAL HIGH (ref 70–99)

## 2018-07-04 LAB — LIPID PANEL
CHOLESTEROL: 181 mg/dL (ref 0–200)
HDL: 40 mg/dL — ABNORMAL LOW (ref >40)
LDL Cholesterol: 116 mg/dL — ABNORMAL HIGH (ref 0–99)
TRIGLYCERIDES: 127 mg/dL (ref <150)
Total CHOL/HDL Ratio: 4.5 RATIO
VLDL: 25 mg/dL (ref 0–40)

## 2018-07-04 LAB — URINALYSIS, ROUTINE W REFLEX MICROSCOPIC
BILIRUBIN URINE: NEGATIVE
Glucose, UA: >=500 mg/dL — AB
Hgb urine dipstick: NEGATIVE
KETONES UR: 5 mg/dL — AB
LEUKOCYTES UA: NEGATIVE
NITRITE: NEGATIVE
PH: 6 (ref 5.0–8.0)
Protein, ur: NEGATIVE mg/dL
SPECIFIC GRAVITY, URINE: 1.005 (ref 1.005–1.030)

## 2018-07-04 LAB — MAGNESIUM: Magnesium: 2 mg/dL (ref 1.7–2.4)

## 2018-07-04 MED ORDER — FLUCONAZOLE 150 MG PO TABS
150.0000 mg | ORAL_TABLET | Freq: Once | ORAL | Status: AC
  Administered 2018-07-04: 150 mg via ORAL
  Filled 2018-07-04: qty 1

## 2018-07-04 NOTE — Progress Notes (Signed)
Patient requesting that if she has visitors she would like staff to return at another time or ask visitors to leave.

## 2018-07-04 NOTE — Progress Notes (Signed)
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 ([place]) Active Problems:   Hyponatremia   Other headache syndrome   Essential hypertension  Newly diagnosed diabetes,  -a1c 15.7 -she is having difficult time understanding disease process, blood glucose testing and insulin injection technique -need intense teaching here, will also benefit from continued outpatient education and initially home health RN for diease management to prevent rehospitalization  -appreciate diabetes RN and dietician input  Headache -no vision changes, esr unremarkable, ct head unremarkable, supportive treatment  HTN: stable on current regimen  I have reviewed tele, has been unremarkable, will d/c tele  Code Status: home  Family Communication: patient  ( she lives by herself)  Disposition Plan: home with home health in am   Consultants:  Diabetes RN  Dietitian  Case manager  Procedures:  none  Antibiotics:  none   Objective: BP 129/71   Pulse 92   Temp 98.4 F (36.9 C) (Oral)   Resp 17   Ht 5' 3" (1.6 m)   Wt 78.6 kg (173 lb 4.5 oz)   SpO2 99%   BMI 30.70 kg/m   Intake/Output Summary (Last 24 hours) at 07/04/2018 1129 Last data filed at 07/04/2018 1683 Gross per 24 hour  Intake 1705.2 ml  Output -  Net 1705.2 ml   Filed Weights   07/02/18 1711 07/03/18 0046  Weight: 84.4 kg (186 lb) 78.6 kg (173 lb 4.5 oz)    Exam: Patient is examined daily including today on 07/04/2018, exams remain the same as of yesterday except that has changed    General:  NAD  Cardiovascular: RRR  Respiratory: CTABL  Abdomen: Soft/ND/NT, positive BS  Musculoskeletal: No  Edema  Neuro: alert, oriented   Data Reviewed: Basic Metabolic Panel: Recent Labs  Lab 07/02/18 2203 07/03/18 0408 07/04/18 0327  NA 127* 131* 133*  K 4.6 4.0 3.9  CL 92* 97* 99  CO2 _0 GLUCOSE 456* 384* 354*  BUN _1 CREATININE 0.90 0.77 0.70  CALCIUM 9.1 8.4* 8.4*  MG  --   --  2.0  PHOS  --   --  3.8   Liver Function Tests: Recent Labs  Lab 07/03/18 0408 07/04/18 0327  AST 16 20  ALT 23 23  ALKPHOS 95 89  BILITOT 1.1 0.9  PROT 5.7* 5.5*  ALBUMIN 2.9* 2.8*   No results for input(s): LIPASE, AMYLASE in the last 168 hours. No results for input(s): AMMONIA in the last 168 hours. CBC: Recent Labs  Lab 07/02/18 2203 07/03/18 0408 07/04/18 0327  WBC 9.4 7.6 7.5  NEUTROABS 6.7 4.9 5.0  HGB 14.8 13.3 12.9  HCT 46.5* 41.0 40.2  MCV 84.2 82.8 83.8  PLT 233 220 203   Cardiac Enzymes:   No results for input(s): CKTOTAL, CKMB, CKMBINDEX, TROPONINI in the last 168 hours. BNP (last 3 results) No results for input(s): BNP in the last 8760 hours.  ProBNP (last 3 results) No results for input(s): PROBNP in the last 8760 hours.  CBG: Recent Labs  Lab 07/03/18 1132 07/03/18 1619 07/03/18 1729 07/03/18 2113 07/04/18 0616  GLUCAP 340* 284* 273* 440* 325*    No results found for this or any previous  visit (from the past 240 hour(s)).   Studies: No results found.  Scheduled Meds: . enoxaparin (LOVENOX) injection  40 mg Subcutaneous Q24H  . fluconazole  150 mg Oral Once  . insulin aspart  0-9 Units Subcutaneous TID WC  . insulin glargine  15 Units Subcutaneous Daily  . insulin starter kit- syringes  1 kit Other Once  . lisinopril  10 mg Oral Daily    Continuous Infusions:   Time spent: 81mns I have personally reviewed and interpreted on  07/04/2018 daily labs, tele strips, imagings as discussed above under date review session and assessment and plans.  I reviewed all nursing notes, pharmacy notes, consultant notes,  vitals, pertinent old  records  I have discussed plan of care as described above with RN , patient on 07/04/2018   FFlorencia ReasonsMD, PhD  [facility] Pager [phone] If 7PM-7AM, please contact night-coverage at [email or web address], password [email or web address]8/05/2018, 11:29 AM  LOS: 0 days

## 2018-07-04 NOTE — Care Management Obs Status (Signed)
[facility] NOTIFICATION   Patient Details  Name: Ellaree Gear MRN: [id number] Date of Birth: Aug 31, 1941   Medicare Observation Status Notification Given:  Yes    Pollie Friar, RN 07/04/2018, 2:47 PM

## 2018-07-04 NOTE — Progress Notes (Signed)
Inpatient Diabetes Program Recommendations  AACE/ADA: New Consensus Statement on Inpatient Glycemic Control (2015)  Target Ranges:  Prepandial:   less than 140 mg/dL      Peak postprandial:   less than 180 mg/dL (1-2 hours)      Critically ill patients:  140 - 180 mg/dL   Results for LATASHIA, KOCH (MRN [id number]) as of 07/04/2018 13:54  Ref. Range 07/03/2018 07:02 07/03/2018 11:32 07/03/2018 16:19 07/03/2018 17:29 07/03/2018 21:13  Glucose-Capillary Latest Ref Range: 70 - 99 mg/dL 298 (H) 340 (H) 284 (H) 273 (H) 440 (H)   Results for ADMIRE, BUNNELL (MRN [id number]) as of 07/04/2018 13:54  Ref. Range 07/04/2018 06:16 07/04/2018 11:46  Glucose-Capillary Latest Ref Range: 70 - 99 mg/dL 325 (H) 374 (H)   Results for ARAH, ARO (MRN [id number]) as of 07/04/2018 13:54  Ref. Range 07/03/2018 04:08  Hemoglobin A1C Latest Ref Range: 4.8 - 5.6 % 15.7 (H)    Admit with: New Diagnosis of DM  Current Orders: Lantus 15 units daily      Novolog Sensitive Correction Scale/ SSI (0-9 units) TID AC      Met with pt yesterday to discuss new diagnosis of DM.  Reviewed very basic facts about diabetes and discussed checking CBGs at home, taking insulin and other medications for sugar control at home, basic diet information, etc.  RNs have been working with pt on giving insulin injections and checking CBGs.  Pt has required much reinforcement during all educational interactions.  Seems very unsure of herself when performing self injection and needs to have every step repeated multiple times before she can perform each step.  I educated pt on rotation of injection sites and encouraged pt to use her abdomen for injections.  Pt was able to draw up sterile water in a practice syringe but again needed a lot of reinforcement and was not able to perform the task independently.  Discussed with pt proper disposal of all sharps and syringes.  Per nursing notes, pt did not get entire injection of Lantus yesterday.  Pt went to give the Lantus  injection and pulled the needle out too quickly and squirted most of the Lantus on her abdomen.  MD was notified and decision made to not give any more Lantus yesterday as RN was unsure how much Lantus pt actually got.  Pt did receive 15 units Lantus this AM.  CBGs >300 mg/dl today.  I spoke with Dr. Erlinda Hong about all the above information.  Dr. Erlinda Hong would like for me to place Outpatient DM education referral to [facility] for pt to attend DM classes after d/c.  Discussed possibility of HHRN for pt for several weeks after d/c for further DM assistance and education as well.  Dr. Erlinda Hong to place care mgmt referral for this.    MD--Unsure patient will be able to administer multiple daily insulin injections.  Based on A1c of 15.7 recommend Lantus daily insulin plus two oral diabetes meds for discharge.   Recommend: Lantus 25 units daily based on weight (0.3 units/kg) Metformin 567m BID Tradjenta 528mdaily Will also need CBG meter Rx at time of d/c as well      --Will follow patient during hospitalization--  JeWyn QuakerN, MSN, CDE Diabetes Coordinator Inpatient Glycemic Control Team Team Pager: [phone])

## 2018-07-04 NOTE — Progress Notes (Signed)
Nutrition Brief Note  Attempted to educate patient but she was very upset about having her blood sugar checked in front of her friends. Repeatedly making statements such as "I don't know if I can live anymore," and "all of my joy is gone," reported to patient's NT who is discussing with Director. Was unable to redirect patient to discuss carb modified diet. Will follow-up. Left materials.  Satira Anis. , MS, RD LDN Inpatient Clinical Dietitian Pager [phone]

## 2018-07-04 NOTE — Progress Notes (Signed)
Talked to patient regarding statements made earlier to RD. Patient stated she no longer feels this way, and that she just "needed to get her feelings out." Denied any suicidal ideation.

## 2018-07-04 NOTE — Plan of Care (Signed)
  RD consulted for nutrition education regarding diabetes.   77 year old female with past medical history significant for hypertension, anxiety and depression, constipation, history of tenderness and other comorbidities who presents with a chief complaint of a headache. Found to have a blood sugar of 400, and A1C of 15.7%   Lab Results  Component Value Date   HGBA1C 15.7 (H) 07/03/2018   Spoke with patient at bedside. She reports a large intake of goya drinks and sprite. Diet also seems to consist of large amounts of plantains, oatmeal, and chicken. Patient states she is going to stop drinking sugary beverages and only drink water. She was open to decreasing the portion sizes of her meals but wants to see a dietitian as an outpatient to have a specific plan made for her. Will put in a referral for her.  RD provided "Carbohydrate Counting for People with Diabetes" handout from the Academy of [employer]etics. Discussed different food groups and their effects on blood sugar, emphasizing carbohydrate-containing foods. Provided list of carbohydrates and recommended serving sizes of common foods.  Discussed importance of controlled and consistent carbohydrate intake throughout the day. Provided examples of ways to balance meals/snacks and encouraged intake of high-fiber, whole grain complex carbohydrates. Teach back method used.  Expect fair compliance.  Body mass index is 30.7 kg/m. Pt meets criteria for obese class I based on current BMI.  Current diet order is carb modified, patient is consuming approximately 100% of meals at this time. Labs and medications reviewed. No further nutrition interventions warranted at this time. RD contact information provided. If additional nutrition issues arise, please re-consult RD.  Satira Anis. , MS, RD LDN Inpatient Clinical Dietitian Pager [phone]

## 2018-07-05 LAB — GLUCOSE, CAPILLARY
GLUCOSE-CAPILLARY: 362 mg/dL — AB (ref 70–99)
Glucose-Capillary: 260 mg/dL — ABNORMAL HIGH (ref 70–99)

## 2018-07-05 LAB — BASIC METABOLIC PANEL
Anion gap: 8 (ref 5–15)
BUN: 9 mg/dL (ref 8–23)
CALCIUM: 8.8 mg/dL — AB (ref 8.9–10.3)
CO2: 28 mmol/L (ref 22–32)
Chloride: 99 mmol/L (ref 98–111)
Creatinine, Ser: 0.69 mg/dL (ref 0.44–1.00)
GFR calc Af Amer: >60 mL/min (ref >60)
GFR calc non Af Amer: >60 mL/min (ref >60)
GLUCOSE: 280 mg/dL — AB (ref 70–99)
Potassium: 4 mmol/L (ref 3.5–5.1)
Sodium: 135 mmol/L (ref 135–145)

## 2018-07-05 MED ORDER — INSULIN DETEMIR 100 UNIT/ML ~~LOC~~ SOLN: 10.0000 [IU] | Freq: Two times a day (BID) | SUBCUTANEOUS | 0 refills | Status: DC

## 2018-07-05 MED ORDER — INSULIN GLARGINE 100 UNITS/ML SOLOSTAR PEN: 10.0000 [IU] | PEN_INJECTOR | Freq: Two times a day (BID) | SUBCUTANEOUS | 0 refills | Status: DC

## 2018-07-05 MED ORDER — "PEN NEEDLES 1/2"" 29G X 12MM MISC": 0 refills | Status: DC

## 2018-07-05 MED ORDER — METFORMIN HCL 500 MG PO TABS: 500.0000 mg | ORAL_TABLET | Freq: Two times a day (BID) | ORAL | 0 refills | Status: DC

## 2018-07-05 MED ORDER — INSULIN STARTER KIT- SYRINGES (ENGLISH): 1.0000 | Freq: Once | 0 refills | Status: AC

## 2018-07-05 MED ORDER — INSULIN GLARGINE 100 UNIT/ML ~~LOC~~ SOLN: 10.0000 [IU] | Freq: Two times a day (BID) | SUBCUTANEOUS | 0 refills | Status: DC

## 2018-07-05 MED ORDER — INSULIN GLARGINE 100 UNIT/ML SOLOSTAR PEN: 10.0000 [IU] | PEN_INJECTOR | Freq: Two times a day (BID) | SUBCUTANEOUS | 0 refills | Status: DC

## 2018-07-05 MED ORDER — BLOOD GLUCOSE MONITOR KIT: PACK | 0 refills | Status: DC

## 2018-07-05 MED ORDER — LINAGLIPTIN 5 MG PO TABS: 5.0000 mg | ORAL_TABLET | Freq: Every day | ORAL | 0 refills | Status: DC

## 2018-07-05 NOTE — Progress Notes (Signed)
Inpatient Diabetes Program Recommendations  AACE/ADA: New Consensus Statement on Inpatient Glycemic Control (2015)  Target Ranges:  Prepandial:   less than 140 mg/dL      Peak postprandial:   less than 180 mg/dL (1-2 hours)      Critically ill patients:  140 - 180 mg/dL    Inpatient Diabetes Program Recommendations:    Reviewed insulin vial and syringe with patient. She was able to do many steps without prompting, however had slight difficulty seeing the 15 units on the syringe.  Patient seems to lack confidence as she tells me to never leave her and to call her when she is discharged. Encouraged her that we have outpatient educators set up that will be calling her and that she will find a PCP to care for her.  Patient had lunch tray delivered. Told patient to eat lunch and practice with the vial and syringe and I will return for another review to see how she does. Patient seems overwhelmed.  Thanks,  Tama Headings RN, MSN, BC-ADM Inpatient Diabetes Coordinator Team Pager [phone] (8a-5p)

## 2018-07-05 NOTE — Progress Notes (Signed)
Pt d/c home, no new complain or concerns. D/c instructions done with teach back , pt verbalize understanding. Pt will be transported out of the hospital by family.

## 2018-07-05 NOTE — Discharge Summary (Addendum)
Discharge Summary  Mylasia Vorhees JHE:[id number] DOB: Sep 05, 1941  PCP: [name]  Admit date: 07/02/2018 Discharge date: 07/05/2018  Time spent: 767mns, more than 50% time spent on coordination of care. Patient has new diagnosis of diabetes, having difficult time understanding disease process, testing and insulin injection technique , needs repeated education. Home health RN arranged, outpatient referral for continued diabetes education as well.  Recommendations for Outpatient Follow-up:  1. F/u with PMD within a week  for hospital discharge follow up, repeat cbc/bmp at follow up. 2. Home health arranged  Discharge Diagnoses:  Active Hospital Problems   Diagnosis Date Noted  . 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 Other headache syndrome 07/03/2018  . Essential hypertension 07/03/2018  . Hyponatremia 10/20/2013    Resolved Hospital Problems  No resolved problems to display.    Discharge Condition: stable  Diet recommendation: heart healthy/carb modified  Filed Weights   07/02/18 1711 07/03/18 0046  Weight: 84.4 kg 78.6 kg    History of present illness: (per admitting MD Dr KHal Hope PCP: MMinette Brine Patient coming from: Home.  Chief Complaint: Headache.  HPI: LBarbera Perrittis a 77y.o. female with history of hypertension presents to the ER with complaints of headache which started off yesterday afternoon.  Headache is mostly in the right parietotemporal area.  Denies any visual symptoms or any weakness of the extremities.  Pain was acute and severe with no associated vomiting.  Patient denies having any history of migraine.  Recently patient has been noticing increasing urination and thirst.  ED Course: In the ER CT head was unremarkable patient was given Reglan and Benadryl following patient's headache has largely resolved.  Patient blood sugar was found to be more than 400 with no anion gap.  Urine does not show any ketones.   Patient finds it difficult to understand diabetes mellitus in the management so admitted for further management given the very elevated blood sugar and that is to go into DKA.  Labs also show hyponatremia likely from hyperglycemia and dehydration.     Hospital Course:  Principal Problem:   Uncontrolled [place] mellitus type 2 without complications ([place] Active Problems:   Hyponatremia   Other headache syndrome   Essential hypertension   Newly diagnosed diabetes, uncontrolled, insulin dependent -a1c 15.7 -she is having difficult time understanding disease process, blood glucose testing and insulin injection technique -need intense teaching here, will also benefit from continued outpatient education and initially home health RN for diease management to prevent rehospitalization .she need to have ongoing education and support, she is to follow up with cpc closely.  -appreciate diabetes RN and dietician input -she is discharged home on lantus 10units bid, (  I did not prescribe her  Sliding scale insulin at discharge due to concerns  about her ability of understanding sliding scale ),metformin 5018mbid, tradjenta 67m60maily  Her insurance does not cover lantus, insulin prescription changed to levemir which her insurance covers.  Headache -no vision changes, esr unremarkable, ct head unremarkable, supportive treatment  HTN: stable on current regimen    Code Status: home  Family Communication: patient  ( she lives by herself)  Disposition Plan: home with home health    Consultants:  Diabetes RN  Dietitian  Case manager  Procedures:  none  Antibiotics:  none    Discharge Exam: BP 132/81 (BP Location: Right Arm)   Pulse 93   Temp 98.3 F (36.8 C) (Oral)   Resp 18  Ht _0  (1.6 m)   Wt 78.6 kg   SpO2 97%   BMI 30.70 kg/m   General: NAD Cardiovascular: RRR Respiratory:  CTABL  Discharge Instructions You were cared for by a hospitalist during  your hospital stay. If you have any questions about your discharge medications or the care you received while you were in the hospital after you are discharged, you can call the unit and asked to speak with the hospitalist on call if the hospitalist that took care of you is not available. Once you are discharged, your primary care physician will handle any further medical issues. Please note that NO REFILLS for any discharge medications will be authorized once you are discharged, as it is imperative that you return to your primary care physician (or establish a relationship with a primary care physician if you do not have one) for your aftercare needs so that they can reassess your need for medications and monitor your lab values.  Discharge Instructions    Amb Referral to Nutrition and Diabetic E   Complete by:  As directed    New diagnosis of DM. A1c 15.7. Likely to d/c home on insulin plus oral meds.   Diet - low sodium heart healthy   Complete by:  As directed    Carb modified diet   Increase activity slowly   Complete by:  As directed      Allergies as of 07/05/2018      Reactions   Aspirin Other (See Comments)   Allergic to Adult Aspirin. Cant sleep.    Hydroxyzine Other (See Comments)   insomnia   Valium [diazepam] Other (See Comments)   Insomnia, 05/10/16 pt states she took as young person- made her depressed      Medication List    TAKE these medications   acetaminophen 325 MG tablet Commonly known as:  TYLENOL Take 325-650 mg by mouth every 6 (six) hours as needed for mild pain, moderate pain or headache. Reported on 05/10/2016   ALPRAZolam 0.25 MG tablet Commonly known as:  XANAX Take 1 tablet (0.25 mg total) by mouth at bedtime as needed for anxiety (or ringing in ear).   blood glucose meter kit and supplies Kit Dispense based on patient and insurance preference. Use up to four times daily as directed. (FOR ICD-9 250.00, 250.01).   Cetirizine HCl 10 MG Caps Take 1  capsule (10 mg total) by mouth daily.   clobetasol ointment 0.05 % Commonly known as:  TEMOVATE Apply 1 application topically 2 (two) times daily. What changed:    when to take this  reasons to take this   diphenhydrAMINE 25 mg capsule Commonly known as:  BENADRYL Take 1 capsule (25 mg total) by mouth at bedtime as needed for allergies.   EPINEPHrine 0.3 mg/0.3 mL Soaj injection Commonly known as:  EPI-PEN Inject 0.3 mLs (0.3 mg total) into the muscle once as needed (for severe allergic reaction). CAll 911 immediately if you have to use this medicine   fluticasone 50 MCG/ACT nasal spray Commonly known as:  FLONASE Place 2 sprays into both nostrils daily. What changed:    when to take this  reasons to take this   insulin detemir 100 UNIT/ML injection Commonly known as:  LEVEMIR Inject 0.1 mLs (10 Units total) into the skin 2 (two) times daily.   insulin starter kit- syringes Misc 1 kit by Other route once for 1 dose.   ipratropium 0.06 % nasal spray Commonly known as:  ATROVENT  Place 2 sprays into both nostrils 4 (four) times daily.   linagliptin 5 MG Tabs tablet Commonly known as:  TRADJENTA Take 1 tablet (5 mg total) by mouth daily.   lisinopril 10 MG tablet Commonly known as:  PRINIVIL,ZESTRIL Take 10 mg by mouth daily.   metFORMIN 500 MG tablet Commonly known as:  GLUCOPHAGE Take 1 tablet (500 mg total) by mouth 2 (two) times daily with a meal.   multivitamin with minerals Tabs tablet Take 1 tablet by mouth daily.   simvastatin 10 MG tablet Commonly known as:  ZOCOR Take 10 mg by mouth daily.   zolpidem 5 MG tablet Commonly known as:  AMBIEN Take 1 tablet (5 mg total) by mouth at bedtime as needed for sleep.      Allergies  Allergen Reactions  . Aspirin Other (See Comments)    Allergic to Adult Aspirin. Cant sleep.   [name] Hydroxyzine Other (See Comments)    insomnia  . Valium [Diazepam] Other (See Comments)    Insomnia, 05/10/16 pt states she took  as young person- made her depressed   Follow-up Information    [name] Follow up in 1 week(s).   Specialty:  General Practice Why:  hospital discharge follow up.  please bring in your meter for your pcp to review, further blood glucose medication adjustment per pcp.  Contact information: [address] [place] [address] [phone]            The results of significant diagnostics from this hospitalization (including imaging, microbiology, ancillary and laboratory) are listed below for reference.    Significant Diagnostic Studies: Ct Head Wo Contrast  Result Date: 07/02/2018 CLINICAL DATA:  Headache since yesterday EXAM: CT HEAD WITHOUT CONTRAST TECHNIQUE: Contiguous axial images were obtained from the base of the skull through the vertex without intravenous contrast. COMPARISON:  05/16/2016.  05/04/2016 FINDINGS: Brain: No mass effect, midline shift, or acute intracranial hemorrhage. Mild atrophy appropriate to age. Minimal chronic ischemic changes in the periventricular white matter of the frontal lobes. Vascular: No hyperdense vessel or unexpected calcification. Skull: Intact. Sinuses/Orbits: Mastoid air cells are clear. Visualized paranasal sinuses are clear. Orbits are within normal limits. Other: Noncontributory. IMPRESSION: No acute intracranial pathology Electronically Signed   By: Marybelle Killings M.D.   On: 07/02/2018 17:42    Microbiology: No results found for this or any previous visit (from the past 240 hour(s)).   Labs: Basic Metabolic Panel: Recent Labs  Lab 07/02/18 2203 07/03/18 0408 07/04/18 0327 07/05/18 0601  NA 127* 131* 133* 135  K 4.6 4.0 3.9 4.0  CL 92* 97* 99 99  CO2 _0 GLUCOSE 456* 384* 354* 280*  BUN _1 CREATININE 0.90 0.77 0.70 0.69  CALCIUM 9.1 8.4* 8.4* 8.8*  MG  --   --  2.0  --   PHOS  --   --  3.8  --    Liver Function Tests: Recent Labs  Lab 07/03/18 0408 07/04/18 0327  AST 16 20  ALT 23 23    ALKPHOS 95 89  BILITOT 1.1 0.9  PROT 5.7* 5.5*  ALBUMIN 2.9* 2.8*   No results for input(s): LIPASE, AMYLASE in the last 168 hours. No results for input(s): AMMONIA in the last 168 hours. CBC: Recent Labs  Lab 07/02/18 2203 07/03/18 0408 07/04/18 0327  WBC 9.4 7.6 7.5  NEUTROABS 6.7 4.9 5.0  HGB 14.8 13.3 12.9  HCT 46.5* 41.0 40.2  MCV 84.2  82.8 83.8  PLT 233 220 203   Cardiac Enzymes: No results for input(s): CKTOTAL, CKMB, CKMBINDEX, TROPONINI in the last 168 hours. BNP: BNP (last 3 results) No results for input(s): BNP in the last 8760 hours.  ProBNP (last 3 results) No results for input(s): PROBNP in the last 8760 hours.  CBG: Recent Labs  Lab 07/04/18 1146 07/04/18 1612 07/04/18 2109 07/05/18 0625 07/05/18 1127  GLUCAP 374* 304* 282* 260* 362*       Signed:  Florencia Reasons MD, PhD  [facility] 07/05/2018, 2:30 PM

## 2018-07-05 NOTE — Progress Notes (Signed)
CM received phone call from MD that patient would like to change PCP. CM provided her [facility] number and placed a call to [facility] and await a return call to see if they can take a new Medicare pt. CM will follow.

## 2018-07-05 NOTE — Care Management Note (Signed)
Case Management Note  Patient Details  Name: Allison Franco MRN: [id number] Date of Birth: [birth year]  Subjective/Objective:       Pt in with uncontrolled DM. She is from home alone.             Action/Plan: CM consulted for [facility] RN for DM management at home. CM met with the patient and provided her choice. [facility]. CM notified Dorian Pod with [facility] and she accepted the referral.  CM following for further d/c needs.   Expected Discharge Date:  07/05/18               Expected Discharge Plan:  [place]  In-House Referral:     Discharge planning Services  CM Consult  Post Acute Care Choice:  Home Health Choice offered to:  Patient  DME Arranged:    DME Agency:     HH Arranged:  RN [place] Agency:  [facility]  Status of Service:  Completed, signed off  If discussed at [place] of Stay Meetings, dates discussed:    Additional Comments:  Pollie Friar, RN 07/05/2018, 10:04 AM

## 2018-07-06 DIAGNOSIS — F419 Anxiety disorder, unspecified: Secondary | ICD-10-CM | POA: Diagnosis not present

## 2018-07-06 DIAGNOSIS — Z9181 History of falling: Secondary | ICD-10-CM

## 2018-07-06 DIAGNOSIS — Z7951 Long term (current) use of inhaled steroids: Secondary | ICD-10-CM | POA: Diagnosis not present

## 2018-07-06 DIAGNOSIS — I10 Essential (primary) hypertension: Secondary | ICD-10-CM | POA: Diagnosis not present

## 2018-07-06 DIAGNOSIS — G4700 Insomnia, unspecified: Secondary | ICD-10-CM | POA: Diagnosis not present

## 2018-07-06 DIAGNOSIS — F329 Major depressive disorder, single episode, unspecified: Secondary | ICD-10-CM | POA: Diagnosis not present

## 2018-07-06 DIAGNOSIS — K5900 Constipation, unspecified: Secondary | ICD-10-CM

## 2018-07-06 DIAGNOSIS — G4489 Other headache syndrome: Secondary | ICD-10-CM | POA: Diagnosis not present

## 2018-07-06 DIAGNOSIS — J309 Allergic rhinitis, unspecified: Secondary | ICD-10-CM

## 2018-07-06 DIAGNOSIS — E1165 Type 2 diabetes mellitus with hyperglycemia: Secondary | ICD-10-CM | POA: Diagnosis not present

## 2018-07-06 NOTE — Care Management (Signed)
07/06/2018 at 1:00 pm: CM was able to obtain her an appointment with Dr Lucianne Lei on September 9 th at 2 pm. CM called and provided this information to the patient. She is to f/u with her current PCP on Monday and then see Dr Criss Rosales after that in September.

## 2018-07-07 ENCOUNTER — Encounter (HOSPITAL_COMMUNITY): Payer: Self-pay | Admitting: Emergency Medicine

## 2018-07-07 ENCOUNTER — Emergency Department (HOSPITAL_COMMUNITY)
Admission: EM | Admit: 2018-07-07 | Discharge: 2018-07-07 | Disposition: A | Discharge status: Home / Self Care | Payer: Medicare Other | Attending: Emergency Medicine | Admitting: Emergency Medicine

## 2018-07-07 ENCOUNTER — Other Ambulatory Visit: Payer: Self-pay

## 2018-07-07 DIAGNOSIS — Z794 Long term (current) use of insulin: Secondary | ICD-10-CM | POA: Diagnosis not present

## 2018-07-07 DIAGNOSIS — E1165 Type 2 diabetes mellitus with hyperglycemia: Secondary | ICD-10-CM | POA: Insufficient documentation

## 2018-07-07 DIAGNOSIS — R739 Hyperglycemia, unspecified: Secondary | ICD-10-CM

## 2018-07-07 DIAGNOSIS — I10 Essential (primary) hypertension: Secondary | ICD-10-CM | POA: Diagnosis not present

## 2018-07-07 DIAGNOSIS — Z79899 Other long term (current) drug therapy: Secondary | ICD-10-CM | POA: Diagnosis not present

## 2018-07-07 HISTORY — DX: Type 2 diabetes mellitus without complications: E11.9

## 2018-07-07 LAB — CBG MONITORING, ED
GLUCOSE-CAPILLARY: 372 mg/dL — AB (ref 70–99)
Glucose-Capillary: 263 mg/dL — ABNORMAL HIGH (ref 70–99)

## 2018-07-07 LAB — URINALYSIS, ROUTINE W REFLEX MICROSCOPIC
BILIRUBIN URINE: NEGATIVE
Bacteria, UA: NONE SEEN
Glucose, UA: >=500 mg/dL — AB
HGB URINE DIPSTICK: NEGATIVE
KETONES UR: NEGATIVE mg/dL
LEUKOCYTES UA: NEGATIVE
NITRITE: NEGATIVE
PROTEIN: NEGATIVE mg/dL
SPECIFIC GRAVITY, URINE: 1.001 — AB (ref 1.005–1.030)
pH: 7 (ref 5.0–8.0)

## 2018-07-07 LAB — BASIC METABOLIC PANEL
ANION GAP: 11 (ref 5–15)
BUN: 7 mg/dL — ABNORMAL LOW (ref 8–23)
CALCIUM: 8.7 mg/dL — AB (ref 8.9–10.3)
CO2: 25 mmol/L (ref 22–32)
Chloride: 93 mmol/L — ABNORMAL LOW (ref 98–111)
Creatinine, Ser: 0.79 mg/dL (ref 0.44–1.00)
GFR calc Af Amer: >60 mL/min (ref >60)
Glucose, Bld: 416 mg/dL — ABNORMAL HIGH (ref 70–99)
Potassium: 4.1 mmol/L (ref 3.5–5.1)
SODIUM: 129 mmol/L — AB (ref 135–145)

## 2018-07-07 LAB — CBC
HCT: 44.1 % (ref 36.0–46.0)
HEMOGLOBIN: 13.8 g/dL (ref 12.0–15.0)
MCH: 26.7 pg (ref 26.0–34.0)
MCHC: 31.3 g/dL (ref 30.0–36.0)
MCV: 85.5 fL (ref 78.0–100.0)
PLATELETS: 242 10*3/uL (ref 150–400)
RBC: 5.16 MIL/uL — ABNORMAL HIGH (ref 3.87–5.11)
RDW: 12.1 % (ref 11.5–15.5)
WBC: 8.2 10*3/uL (ref 4.0–10.5)

## 2018-07-07 MED ORDER — INSULIN ASPART 100 UNIT/ML ~~LOC~~ SOLN
8.0000 [IU] | Freq: Once | SUBCUTANEOUS | Status: AC
  Administered 2018-07-07: 8 [IU] via SUBCUTANEOUS
  Filled 2018-07-07: qty 1

## 2018-07-07 MED ORDER — SODIUM CHLORIDE 0.9 % IV BOLUS
1000.0000 mL | Freq: Once | INTRAVENOUS | Status: AC
  Administered 2018-07-07: 1000 mL via INTRAVENOUS

## 2018-07-07 NOTE — ED Notes (Signed)
CBG collected by this tech. Result "263." RN, Sharyn Lull notified.

## 2018-07-07 NOTE — ED Notes (Signed)
Pt given sandwich and water to eat and drink.

## 2018-07-07 NOTE — ED Notes (Signed)
Patient ambulatory to bathroom with steady gait at this time 

## 2018-07-07 NOTE — Discharge Instructions (Addendum)
It was our pleasure to provide your ER care today - we hope that you feel better.  Drink plenty of fluids/water. Follow diabetic meal plan.   Increased your insulin to 15 units 2x/day (instead of 10 units 2x/day).  Check your blood sugars 4x/day and record values.   Follow up with your doctor Monday as planned.   Return to ER if worse, new symptoms, fevers, vomiting, other concern.

## 2018-07-07 NOTE — ED Notes (Signed)
Patient verbalizes understanding of discharge instructions. Opportunity for questioning and answers were provided. Armband removed by staff, pt discharged from ED.  

## 2018-07-07 NOTE — ED Triage Notes (Signed)
Pt reports recently diagnosed with diabetes, was in the hospital x4 days, sent home with medications and has f/u on Monday. States her sugars have been 300s and 400s despite taking meds, c/o polyuria and polydipsia.

## 2018-07-07 NOTE — ED Provider Notes (Signed)
[facility] EMERGENCY DEPARTMENT Provider Note   CSN: [id number] Arrival date & time: 07/07/18  1015     History   Chief Complaint Chief Complaint  Patient presents with  . Hyperglycemia    HPI Allison Franco is a 77 y.o. female.  Patient recently diagnosed with diabetes, presents w high blood sugars. Symptoms moderate, persistent, worse today. Patient indicates is compliant w rx at home, taking metformin and insulin 10 u bid. Patient notes polyuria, and blood sugars in 300 range. No vomiting. Denies fever or chills. Normal appetite. Denies dysuria. No abd pain. No chest pain or discomfort. Has appt w pcp Monday.   The history is provided by the patient.  Hyperglycemia  Associated symptoms: increased thirst and polyuria   Associated symptoms: no abdominal pain, no chest pain, no confusion, no dysuria, no fever, no shortness of breath and no vomiting     Past Medical History:  Diagnosis Date  . Anxiety   . Constipation   . Depression    "following husband's death"  . Diabetes mellitus without complication ([place])   . Hypertension   . Tinnitus     Patient Active Problem List   Diagnosis Date Noted  . Other headache syndrome 07/03/2018  . Essential hypertension 07/03/2018  . 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 Grief reaction 10/20/2013  . Hyponatremia 10/20/2013  . Obesity 10/20/2013  . Atypical chest pain 09/01/2013  . ALLERGIC RHINITIS 08/11/2009  . KNEE PAIN, BILATERAL 02/13/2009  . COUGH 02/13/2009  . INSOMNIA 07/17/2008  . TIREDNESS 04/16/2008  . HYPERTENSION 01/24/2008  . BACK PAIN, LUMBAR 01/22/2008  . HYPERGLYCEMIA, BORDERLINE 01/27/2007    Past Surgical History:  Procedure Laterality Date  . ABDOMINAL HYSTERECTOMY    . fribroid surgery       OB History   None      Home Medications    Prior to Admission medications   Medication Sig Start Date End Date Taking? Authorizing Provider    acetaminophen (TYLENOL) 325 MG tablet Take 325-650 mg by mouth every 6 (six) hours as needed for mild pain, moderate pain or headache. Reported on 05/10/2016    [provider]  ALPRAZolam Duanne Moron) 0.25 MG tablet Take 1 tablet (0.25 mg total) by mouth at bedtime as needed for anxiety (or ringing in ear). 02/05/18   Orpah Greek, MD  blood glucose meter kit and supplies KIT Dispense based on patient and insurance preference. Use up to four times daily as directed. (FOR ICD-9 250.00, 250.01). 07/05/18   Florencia Reasons, MD  Cetirizine HCl 10 MG CAPS Take 1 capsule (10 mg total) by mouth daily. Patient not taking: Reported on 07/02/2018 04/17/18   Wieters, Hallie C, PA-C  clobetasol ointment (TEMOVATE) 4.23 % Apply 1 application topically 2 (two) times daily. Patient taking differently: Apply 1 application topically 2 (two) times daily as needed (dermatosis).  06/14/18   Raylene Everts, MD  diphenhydrAMINE (BENADRYL) 25 mg capsule Take 1 capsule (25 mg total) by mouth at bedtime as needed for allergies. 12/01/17   Tasia Catchings, Amy V, PA-C  EPINEPHrine (EPIPEN 2-PAK) 0.3 mg/0.3 mL IJ SOAJ injection Inject 0.3 mLs (0.3 mg total) into the muscle once as needed (for severe allergic reaction). CAll 911 immediately if you have to use this medicine 09/10/17   Larene Pickett, PA-C  fluticasone [facility]) 50 MCG/ACT nasal spray Place 2 sprays into both nostrils daily. Patient taking differently: Place 2 sprays into both nostrils daily as  needed for allergies.  01/05/18   Tasia Catchings, Amy V, PA-C  insulin detemir (LEVEMIR) 100 UNIT/ML injection Inject 0.1 mLs (10 Units total) into the skin 2 (two) times daily. 07/05/18   Florencia Reasons, MD  ipratropium (ATROVENT) 0.06 % nasal spray Place 2 sprays into both nostrils 4 (four) times daily. Patient not taking: Reported on 07/02/2018 01/05/18   Ok Edwards, PA-C  linagliptin (TRADJENTA) 5 MG TABS tablet Take 1 tablet (5 mg total) by mouth daily. 07/05/18   Florencia Reasons, MD  lisinopril  (PRINIVIL,ZESTRIL) 10 MG tablet Take 10 mg by mouth daily.    [provider]  metFORMIN (GLUCOPHAGE) 500 MG tablet Take 1 tablet (500 mg total) by mouth 2 (two) times daily with a meal. 07/05/18 07/05/19  Florencia Reasons, MD  Multiple Vitamin (MULTIVITAMIN WITH MINERALS) TABS tablet Take 1 tablet by mouth daily.    [provider]  simvastatin (ZOCOR) 10 MG tablet Take 10 mg by mouth daily.    [provider]  zolpidem (AMBIEN) 5 MG tablet Take 1 tablet (5 mg total) by mouth at bedtime as needed for sleep. 10/31/17   Robyn Haber, MD    Family History Family History  Problem Relation Age of Onset  . Diabetes Mother   . Hypertension Father     Social History Social History   Tobacco Use  . Smoking status: Never Smoker  . Smokeless tobacco: Never Used  Substance Use Topics  . Alcohol use: No  . Drug use: No     Allergies   Aspirin; Hydroxyzine; and Valium [diazepam]   Review of Systems Review of Systems  Constitutional: Negative for fever.  HENT: Negative for sore throat.   Eyes: Negative for redness.  Respiratory: Negative for cough and shortness of breath.   Cardiovascular: Negative for chest pain.  Gastrointestinal: Negative for abdominal pain and vomiting.  Endocrine: Positive for polydipsia and polyuria.  Genitourinary: Negative for dysuria and flank pain.  Musculoskeletal: Negative for back pain and neck pain.  Skin: Negative for rash.  Neurological: Negative for headaches.  Hematological: Does not bruise/bleed easily.  Psychiatric/Behavioral: Negative for confusion.     Physical Exam Updated Vital Signs BP 131/72   Pulse (!) 104   Temp 98 F (36.7 C)   Resp 15   SpO2 100%   Physical Exam  Constitutional: She appears well-developed and well-nourished.  HENT:  Mouth/Throat: Oropharynx is clear and moist.  Eyes: Conjunctivae are normal. No scleral icterus.  Neck: Neck supple. No tracheal deviation present.  Cardiovascular: Normal  rate, regular rhythm, normal heart sounds and intact distal pulses.  Pulmonary/Chest: Effort normal and breath sounds normal. No respiratory distress.  Abdominal: Soft. Normal appearance and bowel sounds are normal. She exhibits no distension. There is no tenderness.  Genitourinary:  Genitourinary Comments: No cva tenderness  Musculoskeletal: She exhibits no edema.  Neurological: She is alert.  Skin: Skin is warm and dry. No rash noted.  Psychiatric: She has a normal mood and affect.  Nursing note and vitals reviewed.    ED Treatments / Results  Labs (all labs ordered are listed, but only abnormal results are displayed) Results for orders placed or performed during the hospital encounter of 16/10/96  Basic metabolic panel  Result Value Ref Range   Sodium 129 (L) 135 - 145 mmol/L   Potassium 4.1 3.5 - 5.1 mmol/L   Chloride 93 (L) 98 - 111 mmol/L   CO2 25 22 - 32 mmol/L   Glucose, Bld 416 (H)  70 - 99 mg/dL   BUN 7 (L) 8 - 23 mg/dL   Creatinine, Ser 0.79 0.44 - 1.00 mg/dL   Calcium 8.7 (L) 8.9 - 10.3 mg/dL   GFR calc non Af Amer >60 >60 mL/min   GFR calc Af Amer >60 >60 mL/min   Anion gap 11 5 - 15  CBC  Result Value Ref Range   WBC 8.2 4.0 - 10.5 K/uL   RBC 5.16 (H) 3.87 - 5.11 MIL/uL   Hemoglobin 13.8 12.0 - 15.0 g/dL   HCT 44.1 36.0 - 46.0 %   MCV 85.5 78.0 - 100.0 fL   MCH 26.7 26.0 - 34.0 pg   MCHC 31.3 30.0 - 36.0 g/dL   RDW 12.1 11.5 - 15.5 %   Platelets 242 150 - 400 K/uL  Urinalysis, Routine w reflex microscopic  Result Value Ref Range   Color, Urine COLORLESS (A) YELLOW   APPearance CLEAR CLEAR   Specific Gravity, Urine 1.001 (L) 1.005 - 1.030   pH 7.0 5.0 - 8.0   Glucose, UA >=500 (A) NEGATIVE mg/dL   Hgb urine dipstick NEGATIVE NEGATIVE   Bilirubin Urine NEGATIVE NEGATIVE   Ketones, ur NEGATIVE NEGATIVE mg/dL   Protein, ur NEGATIVE NEGATIVE mg/dL   Nitrite NEGATIVE NEGATIVE   Leukocytes, UA NEGATIVE NEGATIVE   RBC / HPF 0-5 0 - 5 RBC/hpf   WBC, UA 0-5 0  - 5 WBC/hpf   Bacteria, UA NONE SEEN NONE SEEN  CBG monitoring, ED  Result Value Ref Range   Glucose-Capillary 372 (H) 70 - 99 mg/dL   Comment 1 Notify RN    Comment 2 Document in Chart    Ct Head Wo Contrast  Result Date: 07/02/2018 CLINICAL DATA:  Headache since yesterday EXAM: CT HEAD WITHOUT CONTRAST TECHNIQUE: Contiguous axial images were obtained from the base of the skull through the vertex without intravenous contrast. COMPARISON:  05/16/2016.  05/04/2016 FINDINGS: Brain: No mass effect, midline shift, or acute intracranial hemorrhage. Mild atrophy appropriate to age. Minimal chronic ischemic changes in the periventricular white matter of the frontal lobes. Vascular: No hyperdense vessel or unexpected calcification. Skull: Intact. Sinuses/Orbits: Mastoid air cells are clear. Visualized paranasal sinuses are clear. Orbits are within normal limits. Other: Noncontributory. IMPRESSION: No acute intracranial pathology Electronically Signed   By: Marybelle Killings M.D.   On: 07/02/2018 17:42    EKG None  Radiology No results found.  Procedures Procedures (including critical care time)  Medications Ordered in ED Medications - No data to display   Initial Impression / Assessment and Plan / ED Course  I have reviewed the triage vital signs and the nursing notes.  Pertinent labs & imaging results that were available during my care of the patient were reviewed by me and considered in my medical decision making (see chart for details).  CBG 372. 1 liter ns iv. novolog 8 units sq.   Labs.  Reviewed nursing notes and prior charts for additional history.   Labs reviewed - glucose high, 416, hc03 normal.   Additional ivf.   Po fluids.  Repeat cbg - improved from prior.   rec diabetic diet, increased insulin to 15 units bid, pcp f/u in 2 days as planned.     Final Clinical Impressions(s) / ED Diagnoses   Final diagnoses:  None    ED Discharge Orders    None       Lajean Saver, MD 07/07/18 1210

## 2018-07-07 NOTE — ED Notes (Signed)
ED Provider at bedside. 

## 2018-07-09 ENCOUNTER — Emergency Department (HOSPITAL_COMMUNITY)
Admission: EM | Admit: 2018-07-09 | Discharge: 2018-07-09 | Disposition: A | Discharge status: Home / Self Care | Payer: Medicare Other | Attending: Emergency Medicine | Admitting: Emergency Medicine

## 2018-07-09 ENCOUNTER — Encounter (HOSPITAL_COMMUNITY): Payer: Self-pay | Admitting: Emergency Medicine

## 2018-07-09 DIAGNOSIS — I10 Essential (primary) hypertension: Secondary | ICD-10-CM | POA: Diagnosis not present

## 2018-07-09 DIAGNOSIS — Z79899 Other long term (current) drug therapy: Secondary | ICD-10-CM | POA: Diagnosis not present

## 2018-07-09 DIAGNOSIS — E1165 Type 2 diabetes mellitus with hyperglycemia: Secondary | ICD-10-CM | POA: Diagnosis not present

## 2018-07-09 DIAGNOSIS — Z794 Long term (current) use of insulin: Secondary | ICD-10-CM | POA: Diagnosis not present

## 2018-07-09 DIAGNOSIS — R739 Hyperglycemia, unspecified: Secondary | ICD-10-CM

## 2018-07-09 LAB — CBC WITH DIFFERENTIAL/PLATELET
Abs Immature Granulocytes: 0.1 10*3/uL (ref 0.0–0.1)
Basophils Absolute: 0 10*3/uL (ref 0.0–0.1)
Basophils Relative: 1 %
Eosinophils Absolute: 0.1 10*3/uL (ref 0.0–0.7)
Eosinophils Relative: 1 %
HCT: 43 % (ref 36.0–46.0)
Hemoglobin: 13.1 g/dL (ref 12.0–15.0)
Immature Granulocytes: 1 %
Lymphocytes Relative: 14 %
Lymphs Abs: 1.1 10*3/uL (ref 0.7–4.0)
MCH: 26.4 pg (ref 26.0–34.0)
MCHC: 30.5 g/dL (ref 30.0–36.0)
MCV: 86.5 fL (ref 78.0–100.0)
Monocytes Absolute: 0.6 10*3/uL (ref 0.1–1.0)
Monocytes Relative: 8 %
Neutro Abs: 5.9 10*3/uL (ref 1.7–7.7)
Neutrophils Relative %: 75 %
Platelets: 227 10*3/uL (ref 150–400)
RBC: 4.97 MIL/uL (ref 3.87–5.11)
RDW: 12.3 % (ref 11.5–15.5)
WBC: 7.8 10*3/uL (ref 4.0–10.5)

## 2018-07-09 LAB — BASIC METABOLIC PANEL
Anion gap: 8 (ref 5–15)
BUN: 5 mg/dL — ABNORMAL LOW (ref 8–23)
CO2: 27 mmol/L (ref 22–32)
Calcium: 9 mg/dL (ref 8.9–10.3)
Chloride: 99 mmol/L (ref 98–111)
Creatinine, Ser: 0.63 mg/dL (ref 0.44–1.00)
GFR calc Af Amer: >60 mL/min (ref >60)
GFR calc non Af Amer: >60 mL/min (ref >60)
Glucose, Bld: 280 mg/dL — ABNORMAL HIGH (ref 70–99)
Potassium: 4.1 mmol/L (ref 3.5–5.1)
Sodium: 134 mmol/L — ABNORMAL LOW (ref 135–145)

## 2018-07-09 LAB — CBG MONITORING, ED: GLUCOSE-CAPILLARY: 275 mg/dL — AB (ref 70–99)

## 2018-07-09 MED ORDER — METFORMIN HCL 500 MG PO TABS
500.0000 mg | ORAL_TABLET | Freq: Two times a day (BID) | ORAL | Status: DC
  Administered 2018-07-09: 500 mg via ORAL
  Filled 2018-07-09: qty 1

## 2018-07-09 MED ORDER — INSULIN DETEMIR 100 UNIT/ML ~~LOC~~ SOLN
10.0000 [IU] | Freq: Two times a day (BID) | SUBCUTANEOUS | Status: DC
  Administered 2018-07-09: 10 [IU] via SUBCUTANEOUS
  Filled 2018-07-09 (×2): qty 0.1

## 2018-07-09 NOTE — ED Notes (Signed)
Pt states that she does not "feel " good , pt did not take her insulin this morning

## 2018-07-09 NOTE — Discharge Instructions (Addendum)
Return here as needed. Follow up with your doctor for a recheck. °

## 2018-07-09 NOTE — ED Triage Notes (Signed)
Patient states she checked her glucose at home with her glucometer and measured 652, did not take any medications, came to ED for concern of hyperglycemia. CBG in triage 275. Patient denies other complaints, has appointment with PCP at 1000. Patient alert, oriented, and in no apparent distress at this time.

## 2018-07-09 NOTE — ED Provider Notes (Signed)
[facility] EMERGENCY DEPARTMENT Provider Note   CSN: [id number] Arrival date & time: 07/09/18  0801     History   Chief Complaint Chief Complaint  Patient presents with  . Hyperglycemia    HPI Allison Franco is a 77 y.o. female.  HPI Patient presents to the emergency department with complaints of elevated blood sugar.  The patient states that at home her blood sugar was elevated so she came here for reevaluation.  Patient states she has a doctor's appointment this morning that she missed because she wanted to be evaluated here.  Patient states that she does not understand exactly what to eat was diabetes.  Patient states she was educated in the hospital on this.  The patient denies chest pain, shortness of breath, headache,blurred vision, neck pain, fever, cough, weakness, numbness, dizziness, anorexia, edema, abdominal pain, nausea, vomiting, diarrhea, rash, back pain, dysuria, hematemesis, bloody stool, near syncope, or syncope. Past Medical History:  Diagnosis Date  . Anxiety   . Constipation   . Depression    "following husband's death"  . Diabetes mellitus without complication ([place])   . Hypertension   . Tinnitus     Patient Active Problem List   Diagnosis Date Noted  . Other headache syndrome 07/03/2018  . Essential hypertension 07/03/2018  . Uncontrolled diabetes mellitus type 2 without complications ([place]) 73/56/7014  . Grief reaction 10/20/2013  . Hyponatremia 10/20/2013  . Obesity 10/20/2013  . Atypical chest pain 09/01/2013  . ALLERGIC RHINITIS 08/11/2009  . KNEE PAIN, BILATERAL 02/13/2009  . COUGH 02/13/2009  . INSOMNIA 07/17/2008  . TIREDNESS 04/16/2008  . HYPERTENSION 01/24/2008  . BACK PAIN, LUMBAR 01/22/2008  . HYPERGLYCEMIA, BORDERLINE 01/27/2007    Past Surgical History:  Procedure Laterality Date  . ABDOMINAL HYSTERECTOMY    . fribroid surgery       OB History   None      Home Medications    Prior to Admission  medications   Medication Sig Start Date End Date Taking? Authorizing Provider  acetaminophen (TYLENOL) 325 MG tablet Take 325-650 mg by mouth every 6 (six) hours as needed for mild pain, moderate pain or headache. Reported on 05/10/2016    [provider]  ALPRAZolam Duanne Moron) 0.25 MG tablet Take 1 tablet (0.25 mg total) by mouth at bedtime as needed for anxiety (or ringing in ear). 02/05/18   Orpah Greek, MD  blood glucose meter kit and supplies KIT Dispense based on patient and insurance preference. Use up to four times daily as directed. (FOR ICD-9 250.00, 250.01). 07/05/18   Florencia Reasons, MD  Cetirizine HCl 10 MG CAPS Take 1 capsule (10 mg total) by mouth daily. Patient not taking: Reported on 07/02/2018 04/17/18   Wieters, Hallie C, PA-C  clobetasol ointment (TEMOVATE) 1.03 % Apply 1 application topically 2 (two) times daily. Patient taking differently: Apply 1 application topically 2 (two) times daily as needed (dermatosis).  06/14/18   Raylene Everts, MD  diphenhydrAMINE (BENADRYL) 25 mg capsule Take 1 capsule (25 mg total) by mouth at bedtime as needed for allergies. 12/01/17   Tasia Catchings, Amy V, PA-C  EPINEPHrine (EPIPEN 2-PAK) 0.3 mg/0.3 mL IJ SOAJ injection Inject 0.3 mLs (0.3 mg total) into the muscle once as needed (for severe allergic reaction). CAll 911 immediately if you have to use this medicine 09/10/17   Larene Pickett, PA-C  fluticasone [facility]) 50 MCG/ACT nasal spray Place 2 sprays into both nostrils daily. Patient taking differently: Place 2 sprays into  both nostrils daily as needed for allergies.  01/05/18   Tasia Catchings, Amy V, PA-C  insulin detemir (LEVEMIR) 100 UNIT/ML injection Inject 0.1 mLs (10 Units total) into the skin 2 (two) times daily. 07/05/18   Florencia Reasons, MD  ipratropium (ATROVENT) 0.06 % nasal spray Place 2 sprays into both nostrils 4 (four) times daily. Patient not taking: Reported on 07/02/2018 01/05/18   Ok Edwards, PA-C  linagliptin (TRADJENTA) 5 MG TABS tablet Take 1 tablet  (5 mg total) by mouth daily. 07/05/18   Florencia Reasons, MD  lisinopril (PRINIVIL,ZESTRIL) 10 MG tablet Take 10 mg by mouth daily.    [provider]  metFORMIN (GLUCOPHAGE) 500 MG tablet Take 1 tablet (500 mg total) by mouth 2 (two) times daily with a meal. 07/05/18 07/05/19  Florencia Reasons, MD  Multiple Vitamin (MULTIVITAMIN WITH MINERALS) TABS tablet Take 1 tablet by mouth daily.    [provider]  simvastatin (ZOCOR) 10 MG tablet Take 10 mg by mouth daily.    [provider]  zolpidem (AMBIEN) 5 MG tablet Take 1 tablet (5 mg total) by mouth at bedtime as needed for sleep. 10/31/17   Robyn Haber, MD    Family History Family History  Problem Relation Age of Onset  . Diabetes Mother   . Hypertension Father     Social History Social History   Tobacco Use  . Smoking status: Never Smoker  . Smokeless tobacco: Never Used  Substance Use Topics  . Alcohol use: No  . Drug use: No     Allergies   Aspirin; Hydroxyzine; and Valium [diazepam]   Review of Systems Review of Systems All other systems negative except as documented in the HPI. All pertinent positives and negatives as reviewed in the HPI.  Physical Exam Updated Vital Signs BP 134/86 (BP Location: Left Arm)   Pulse (!) 104   Temp 98.2 F (36.8 C) (Oral)   Resp 17   SpO2 97%   Physical Exam  Constitutional: She is oriented to person, place, and time. She appears well-developed and well-nourished. No distress.  HENT:  Head: Normocephalic and atraumatic.  Mouth/Throat: Oropharynx is clear and moist.  Eyes: Pupils are equal, round, and reactive to light.  Neck: Normal range of motion. Neck supple.  Cardiovascular: Normal rate, regular rhythm and normal heart sounds. Exam reveals no gallop and no friction rub.  No murmur heard. Pulmonary/Chest: Effort normal and breath sounds normal. No respiratory distress. She has no wheezes.  Abdominal: Soft. Bowel sounds are normal. She exhibits no distension. There  is no tenderness.  Neurological: She is alert and oriented to person, place, and time. She exhibits normal muscle tone. Coordination normal.  Skin: Skin is warm and dry. Capillary refill takes less than 2 seconds. No rash noted. No erythema.  Psychiatric: She has a normal mood and affect. Her behavior is normal.  Nursing note and vitals reviewed.    ED Treatments / Results  Labs (all labs ordered are listed, but only abnormal results are displayed) Labs Reviewed  BASIC METABOLIC PANEL - Abnormal; Notable for the following components:      Result Value   Sodium 134 (*)    Glucose, Bld 280 (*)    BUN 5 (*)    All other components within normal limits  CBG MONITORING, ED - Abnormal; Notable for the following components:   Glucose-Capillary 275 (*)    All other components within normal limits  CBC WITH DIFFERENTIAL/PLATELET    EKG None  Radiology No results found.  Procedures Procedures (including critical care time)  Medications Ordered in ED Medications  metFORMIN (GLUCOPHAGE) tablet 500 mg (500 mg Oral Given 07/09/18 1254)  insulin detemir (LEVEMIR) injection 10 Units (10 Units Subcutaneous Given 07/09/18 1341)     Initial Impression / Assessment and Plan / ED Course  I have reviewed the triage vital signs and the nursing notes.  Pertinent labs & imaging results that were available during my care of the patient were reviewed by me and considered in my medical decision making (see chart for details).  Clinical Course as of Jul 09 1629  Mon Aug 12, 517  [id number] 77 year old female here for elevated blood sugars.  Patient sounds very uninformed about her on medications and unclear she is been taking them.  Ultimately she is not in DKA and appropriate for her to be discharged and follow-up with her PCP.   [MB]    Clinical Course User Index [MB] Hayden Rasmussen, MD    Patient is given her home medications here in the emergency department.  She is not in DKA and showing no  signs of any acute illness.  The patient is advised that she will need to follow-up with her doctor as soon as possible.  Patient agrees the plan and all questions were answered.  Patient is been stable here in the emergency department and showing no signs of distress.  Final Clinical Impressions(s) / ED Diagnoses   Final diagnoses:  Hyperglycemia    ED Discharge Orders    None       Dalia Heading, Hershal Coria 07/09/18 1632    Hayden Rasmussen, MD 07/09/18 1747

## 2018-07-10 DIAGNOSIS — E1165 Type 2 diabetes mellitus with hyperglycemia: Secondary | ICD-10-CM | POA: Diagnosis not present

## 2018-07-10 DIAGNOSIS — F329 Major depressive disorder, single episode, unspecified: Secondary | ICD-10-CM | POA: Diagnosis not present

## 2018-07-10 DIAGNOSIS — I10 Essential (primary) hypertension: Secondary | ICD-10-CM | POA: Diagnosis not present

## 2018-07-10 DIAGNOSIS — F419 Anxiety disorder, unspecified: Secondary | ICD-10-CM | POA: Diagnosis not present

## 2018-07-10 DIAGNOSIS — G4489 Other headache syndrome: Secondary | ICD-10-CM | POA: Diagnosis not present

## 2018-07-10 DIAGNOSIS — G4700 Insomnia, unspecified: Secondary | ICD-10-CM | POA: Diagnosis not present

## 2018-07-11 ENCOUNTER — Encounter (HOSPITAL_COMMUNITY): Payer: Self-pay

## 2018-07-11 ENCOUNTER — Other Ambulatory Visit: Payer: Self-pay

## 2018-07-11 ENCOUNTER — Emergency Department (HOSPITAL_COMMUNITY)
Admission: EM | Admit: 2018-07-11 | Discharge: 2018-07-11 | Disposition: A | Discharge status: Home / Self Care | Payer: Medicare Other | Attending: Emergency Medicine | Admitting: Emergency Medicine

## 2018-07-11 DIAGNOSIS — T783XXA Angioneurotic edema, initial encounter: Secondary | ICD-10-CM | POA: Diagnosis not present

## 2018-07-11 DIAGNOSIS — E119 Type 2 diabetes mellitus without complications: Secondary | ICD-10-CM | POA: Diagnosis not present

## 2018-07-11 DIAGNOSIS — Z79899 Other long term (current) drug therapy: Secondary | ICD-10-CM | POA: Diagnosis not present

## 2018-07-11 DIAGNOSIS — R069 Unspecified abnormalities of breathing: Secondary | ICD-10-CM | POA: Diagnosis not present

## 2018-07-11 DIAGNOSIS — R0682 Tachypnea, not elsewhere classified: Secondary | ICD-10-CM | POA: Diagnosis not present

## 2018-07-11 DIAGNOSIS — R0602 Shortness of breath: Secondary | ICD-10-CM | POA: Diagnosis not present

## 2018-07-11 DIAGNOSIS — Z794 Long term (current) use of insulin: Secondary | ICD-10-CM | POA: Insufficient documentation

## 2018-07-11 DIAGNOSIS — R609 Edema, unspecified: Secondary | ICD-10-CM | POA: Diagnosis not present

## 2018-07-11 DIAGNOSIS — I10 Essential (primary) hypertension: Secondary | ICD-10-CM | POA: Diagnosis not present

## 2018-07-11 LAB — CBG MONITORING, ED: Glucose-Capillary: 218 mg/dL — ABNORMAL HIGH (ref 70–99)

## 2018-07-11 MED ORDER — OLMESARTAN MEDOXOMIL 20 MG PO TABS: 20.0000 mg | ORAL_TABLET | Freq: Every day | ORAL | 0 refills | Status: DC

## 2018-07-11 MED ORDER — SODIUM CHLORIDE 0.9 % IV BOLUS
1000.0000 mL | Freq: Once | INTRAVENOUS | Status: AC
  Administered 2018-07-11: 1000 mL via INTRAVENOUS

## 2018-07-11 MED ORDER — DIPHENHYDRAMINE HCL 50 MG/ML IJ SOLN: 25.0000 mg | Freq: Once | INTRAMUSCULAR | Status: DC

## 2018-07-11 MED ORDER — FAMOTIDINE IN NACL 20-0.9 MG/50ML-% IV SOLN
20.0000 mg | Freq: Once | INTRAVENOUS | Status: AC
  Administered 2018-07-11: 20 mg via INTRAVENOUS
  Filled 2018-07-11: qty 50

## 2018-07-11 NOTE — ED Notes (Signed)
Pt states " I feel a lot better at this time "

## 2018-07-11 NOTE — ED Triage Notes (Signed)
Per [facility], pt coming from home with c/o sudden onset angioedema and SOB. Pt takes lisinopril. Pt reported initial epigastric pain, but currently denies. Pt received 50 of benadryl via EMS. Pt maintaining airway. Lung sounds clear. Facial swelling noted.

## 2018-07-11 NOTE — ED Provider Notes (Signed)
[facility] EMERGENCY DEPARTMENT Provider Note   CSN: [id number] Arrival date & time: 07/11/18  3976     History   Chief Complaint Chief Complaint  Patient presents with  . Angioedema  . Shortness of Breath    HPI Allison Franco is a 77 y.o. female.  The history is provided by the patient and medical records. No language interpreter was used.  Shortness of Breath      77 year old female with history of hypertension currently on lisinopril, diabetes brought here via EMS from home for evaluation of shortness of breath and angioedema.  Patient report last night she felt a flushing sensation with itchiness throughout her body follows with swelling of her lips.  She went to sleep, woke up this morning feeling short of breath and the swelling her lips continues.  EMS was contacted and patient received 50 mg of Benadryl prior to arrival which did improve her symptoms.  She mentioned shortness of breath has improved.  She denies fever, chills, lightheadedness, dizziness, headache, tongue swelling, throat swelling, pleuritic chest pain or back pain.  She denies any rash.  She is currently on lisinopril and have been on that medication for several years.  She was seen in the ED several days prior for elevated blood sugar.  Patient was encouraged to take insulin and metformin.  She did try to modified her diet since then, and started to take food with less carbohydrate.  States she ate Cheerios cereal last night with milk.  Denies any consumption of shellfish.  She does eat peanuts but has never had an consultation with that before.  She did report having one similar episode with lip swelling a month ago after eating.  She is unable to recall what kind of food.  Past Medical History:  Diagnosis Date  . Anxiety   . Constipation   . Depression    "following husband's death"  . Diabetes mellitus without complication ([place])   . Hypertension   . Tinnitus     Patient Active Problem  List   Diagnosis Date Noted  . Other headache syndrome 07/03/2018  . Essential hypertension 07/03/2018  . Uncontrolled diabetes mellitus type 2 without complications ([place]) 73/41/9379  . Grief reaction 10/20/2013  . Hyponatremia 10/20/2013  . Obesity 10/20/2013  . Atypical chest pain 09/01/2013  . ALLERGIC RHINITIS 08/11/2009  . KNEE PAIN, BILATERAL 02/13/2009  . COUGH 02/13/2009  . INSOMNIA 07/17/2008  . TIREDNESS 04/16/2008  . HYPERTENSION 01/24/2008  . BACK PAIN, LUMBAR 01/22/2008  . HYPERGLYCEMIA, BORDERLINE 01/27/2007    Past Surgical History:  Procedure Laterality Date  . ABDOMINAL HYSTERECTOMY    . fribroid surgery       OB History   None      Home Medications    Prior to Admission medications   Medication Sig Start Date End Date Taking? Authorizing Provider  acetaminophen (TYLENOL) 325 MG tablet Take 325-650 mg by mouth every 6 (six) hours as needed for mild pain, moderate pain or headache. Reported on 05/10/2016    [provider]  ALPRAZolam Duanne Moron) 0.25 MG tablet Take 1 tablet (0.25 mg total) by mouth at bedtime as needed for anxiety (or ringing in ear). 02/05/18   Orpah Greek, MD  blood glucose meter kit and supplies KIT Dispense based on patient and insurance preference. Use up to four times daily as directed. (FOR ICD-9 250.00, 250.01). 07/05/18   Florencia Reasons, MD  Cetirizine HCl 10 MG CAPS Take 1 capsule (10 mg  total) by mouth daily. Patient not taking: Reported on 07/02/2018 04/17/18   Wieters, Hallie C, PA-C  clobetasol ointment (TEMOVATE) 2.54 % Apply 1 application topically 2 (two) times daily. Patient taking differently: Apply 1 application topically 2 (two) times daily as needed (dermatosis).  06/14/18   Raylene Everts, MD  diphenhydrAMINE (BENADRYL) 25 mg capsule Take 1 capsule (25 mg total) by mouth at bedtime as needed for allergies. 12/01/17   Tasia Catchings, Amy V, PA-C  EPINEPHrine (EPIPEN 2-PAK) 0.3 mg/0.3 mL IJ SOAJ injection Inject 0.3 mLs (0.3 mg  total) into the muscle once as needed (for severe allergic reaction). CAll 911 immediately if you have to use this medicine 09/10/17   Larene Pickett, PA-C  fluticasone [facility]) 50 MCG/ACT nasal spray Place 2 sprays into both nostrils daily. Patient taking differently: Place 2 sprays into both nostrils daily as needed for allergies.  01/05/18   Tasia Catchings, Amy V, PA-C  insulin detemir (LEVEMIR) 100 UNIT/ML injection Inject 0.1 mLs (10 Units total) into the skin 2 (two) times daily. 07/05/18   Florencia Reasons, MD  ipratropium (ATROVENT) 0.06 % nasal spray Place 2 sprays into both nostrils 4 (four) times daily. Patient not taking: Reported on 07/02/2018 01/05/18   Ok Edwards, PA-C  linagliptin (TRADJENTA) 5 MG TABS tablet Take 1 tablet (5 mg total) by mouth daily. 07/05/18   Florencia Reasons, MD  lisinopril (PRINIVIL,ZESTRIL) 10 MG tablet Take 10 mg by mouth daily.    [provider]  metFORMIN (GLUCOPHAGE) 500 MG tablet Take 1 tablet (500 mg total) by mouth 2 (two) times daily with a meal. 07/05/18 07/05/19  Florencia Reasons, MD  Multiple Vitamin (MULTIVITAMIN WITH MINERALS) TABS tablet Take 1 tablet by mouth daily.    [provider]  simvastatin (ZOCOR) 10 MG tablet Take 10 mg by mouth daily.    [provider]  zolpidem (AMBIEN) 5 MG tablet Take 1 tablet (5 mg total) by mouth at bedtime as needed for sleep. 10/31/17   Robyn Haber, MD    Family History Family History  Problem Relation Age of Onset  . Diabetes Mother   . Hypertension Father     Social History Social History   Tobacco Use  . Smoking status: Never Smoker  . Smokeless tobacco: Never Used  Substance Use Topics  . Alcohol use: No  . Drug use: No     Allergies   Aspirin; Hydroxyzine; and Valium [diazepam]   Review of Systems Review of Systems  Respiratory: Positive for shortness of breath.   All other systems reviewed and are negative.    Physical Exam Updated Vital Signs BP 127/68   Pulse 77   Temp 98.8 F (37.1 C)  (Oral)   Resp (!) 26   Ht _0  (1.6 m)   Wt 84.4 kg   SpO2 100%   BMI 32.95 kg/m   Physical Exam  Constitutional: She appears well-developed and well-nourished. No distress.  Elderly female resting comfortably in bed in no acute discomfort.  HENT:  Head: Atraumatic.  Obvious edema involving both upper and lower lip.  No tongue swelling, no posterior oropharyngeal erythema, normal phonation.  Eyes: Conjunctivae are normal.  Neck: Neck supple. No thyromegaly present.  No stridor  Cardiovascular: Normal rate and regular rhythm.  Pulmonary/Chest: Effort normal. No stridor. She has no wheezes. She has no rales.  Abdominal: Soft. There is tenderness (Mild epigastric tenderness no guarding or rebound tenderness).  Neurological: She is alert.  Skin: No rash noted.  Psychiatric: She has a normal mood and affect.  Nursing note and vitals reviewed.    ED Treatments / Results  Labs (all labs ordered are listed, but only abnormal results are displayed) Labs Reviewed  CBG MONITORING, ED - Abnormal; Notable for the following components:      Result Value   Glucose-Capillary 218 (*)    All other components within normal limits    EKG EKG Interpretation  Date/Time:  Wednesday July 11 2018 06:17:32 EDT Ventricular Rate:  88 PR Interval:    QRS Duration: 92 QT Interval:  341 QTC Calculation: 413 R Axis:   37 Text Interpretation:  Sinus rhythm LAE, consider biatrial enlargement Consider anterior infarct Minimal ST elevation, lateral leads Baseline wander in lead(s) III No significant change since last tracing Confirmed by Isla Pence [phone]) on 07/11/2018 7:54:39 AM   Radiology No results found.  Procedures Procedures (including critical care time)  Medications Ordered in ED Medications  sodium chloride 0.9 % bolus 1,000 mL (0 mLs Intravenous Stopped 07/11/18 0823)  famotidine (PEPCID) IVPB 20 mg premix (0 mg Intravenous Stopped 07/11/18 0724)     Initial Impression /  Assessment and Plan / ED Course  I have reviewed the triage vital signs and the nursing notes.  Pertinent labs & imaging results that were available during my care of the patient were reviewed by me and considered in my medical decision making (see chart for details).     BP (!) 103/54   Pulse 90   Temp 98.8 F (37.1 C) (Oral)   Resp 17   Ht _0  (1.6 m)   Wt 84.4 kg   SpO2 99%   BMI 32.95 kg/m    Final Clinical Impressions(s) / ED Diagnoses   Final diagnoses:  Angioedema of lips, initial encounter    ED Discharge Orders         Ordered    olmesartan (BENICAR) 20 MG tablet  Daily     07/11/18 1142         6:46 AM Patient here with angioedema involving the lips since last night.  She is currently on lisinopril which could be a causative factor for her angioedema.  She has also changing her diet recently as well as been on metformin and lisinopril for uncontrolled diabetes which she was seen 2 days prior.  At this time she is resting comfortably without any airway compromise.  O2 saturation is at 100% on room air.  No wheezing or stridor noted.  Will monitor closely, plan to discontinue lisinopril and switch patient to a different blood pressure agent.  Will reach out to PCP for recommendation.  Care discussed with Dr. Leonette Monarch.   11:45 AM Patient has been monitored for the past 5 hours with improvement of her symptoms.  I did reach out to patient's PCP on the phone.  Apparently, she was evaluated for one similar occurrence of angioedema a month ago which was thought to be lisinopril related.  She was told to discontinue this medication and switch to Olmesartan 86m PO daily.  This medication is currently not on her medication list.  She did resume taking her lisinopril which may be the causative factor of her recurrent angioedema.  Patient is aware to discontinue lisinopril and started the new blood pressure medication.  She does have a follow-up appointment in 2 days for further  care.  Prompt return precautions discussed.  At this time patient is stable for discharge.   TDomenic Moras PA-C 07/11/18 1151  Fatima Blank, MD 07/15/18 2256    Fatima Blank, MD 07/15/18 2308

## 2018-07-11 NOTE — ED Notes (Signed)
Pt ambulated to bathroom with strong and steady gait , no assistance needed ;pt able to tolerate and eat food with no problem

## 2018-07-11 NOTE — Discharge Instructions (Signed)
Your lip swelling is likely due to taking Lisinopril blood pressure medication.  Please stop taking this medication and take Olmesartan 20mg  once daily instead.  Call and follow up with your doctor this Friday at 9:30 AM for further care.  Return promptly if your condition worsen or if you have other concerns.

## 2018-07-11 NOTE — ED Notes (Signed)
ED Provider at bedside. 

## 2018-07-12 ENCOUNTER — Encounter (HOSPITAL_COMMUNITY): Payer: Self-pay | Admitting: Emergency Medicine

## 2018-07-12 ENCOUNTER — Emergency Department (HOSPITAL_COMMUNITY)
Admission: EM | Admit: 2018-07-12 | Discharge: 2018-07-12 | Disposition: A | Discharge status: Home / Self Care | Payer: Medicare Other | Attending: Emergency Medicine | Admitting: Emergency Medicine

## 2018-07-12 DIAGNOSIS — Z794 Long term (current) use of insulin: Secondary | ICD-10-CM | POA: Diagnosis not present

## 2018-07-12 DIAGNOSIS — E860 Dehydration: Secondary | ICD-10-CM | POA: Insufficient documentation

## 2018-07-12 DIAGNOSIS — R002 Palpitations: Secondary | ICD-10-CM | POA: Insufficient documentation

## 2018-07-12 DIAGNOSIS — I10 Essential (primary) hypertension: Secondary | ICD-10-CM | POA: Diagnosis not present

## 2018-07-12 DIAGNOSIS — E119 Type 2 diabetes mellitus without complications: Secondary | ICD-10-CM | POA: Insufficient documentation

## 2018-07-12 DIAGNOSIS — Z79899 Other long term (current) drug therapy: Secondary | ICD-10-CM | POA: Insufficient documentation

## 2018-07-12 DIAGNOSIS — F419 Anxiety disorder, unspecified: Secondary | ICD-10-CM

## 2018-07-12 LAB — URINALYSIS, ROUTINE W REFLEX MICROSCOPIC
Bacteria, UA: NONE SEEN
Bilirubin Urine: NEGATIVE
Glucose, UA: >=500 mg/dL — AB
Hgb urine dipstick: NEGATIVE
Ketones, ur: NEGATIVE mg/dL
Nitrite: NEGATIVE
Protein, ur: NEGATIVE mg/dL
Specific Gravity, Urine: 1.007 (ref 1.005–1.030)
pH: 6 (ref 5.0–8.0)

## 2018-07-12 LAB — CBC WITH DIFFERENTIAL/PLATELET
Abs Immature Granulocytes: 0.1 10*3/uL (ref 0.0–0.1)
Basophils Absolute: 0 10*3/uL (ref 0.0–0.1)
Basophils Relative: 0 %
Eosinophils Absolute: 0.1 10*3/uL (ref 0.0–0.7)
Eosinophils Relative: 1 %
HCT: 38.7 % (ref 36.0–46.0)
Hemoglobin: 12 g/dL (ref 12.0–15.0)
Immature Granulocytes: 1 %
Lymphocytes Relative: 13 %
Lymphs Abs: 1.1 10*3/uL (ref 0.7–4.0)
MCH: 27 pg (ref 26.0–34.0)
MCHC: 31 g/dL (ref 30.0–36.0)
MCV: 87.2 fL (ref 78.0–100.0)
Monocytes Absolute: 0.7 10*3/uL (ref 0.1–1.0)
Monocytes Relative: 9 %
Neutro Abs: 5.9 10*3/uL (ref 1.7–7.7)
Neutrophils Relative %: 76 %
Platelets: 201 10*3/uL (ref 150–400)
RBC: 4.44 MIL/uL (ref 3.87–5.11)
RDW: 12.3 % (ref 11.5–15.5)
WBC: 7.9 10*3/uL (ref 4.0–10.5)

## 2018-07-12 LAB — COMPREHENSIVE METABOLIC PANEL
ALT: 32 U/L (ref 0–44)
AST: 25 U/L (ref 15–41)
Albumin: 3 g/dL — ABNORMAL LOW (ref 3.5–5.0)
Alkaline Phosphatase: 76 U/L (ref 38–126)
Anion gap: 9 (ref 5–15)
BUN: 11 mg/dL (ref 8–23)
CALCIUM: 8.7 mg/dL — AB (ref 8.9–10.3)
CHLORIDE: 99 mmol/L (ref 98–111)
CO2: 23 mmol/L (ref 22–32)
Creatinine, Ser: 0.69 mg/dL (ref 0.44–1.00)
GFR calc Af Amer: >60 mL/min (ref >60)
Glucose, Bld: 315 mg/dL — ABNORMAL HIGH (ref 70–99)
POTASSIUM: 4.3 mmol/L (ref 3.5–5.1)
SODIUM: 131 mmol/L — AB (ref 135–145)
TOTAL PROTEIN: 5.9 g/dL — AB (ref 6.5–8.1)
Total Bilirubin: 1.4 mg/dL — ABNORMAL HIGH (ref 0.3–1.2)

## 2018-07-12 LAB — TSH: TSH: 1.298 u[IU]/mL (ref 0.350–4.500)

## 2018-07-12 LAB — T4, FREE: Free T4: 1.26 ng/dL (ref 0.82–1.77)

## 2018-07-12 MED ORDER — ALPRAZOLAM 0.25 MG PO TABS: 0.2500 mg | ORAL_TABLET | Freq: Three times a day (TID) | ORAL | 0 refills | Status: DC | PRN

## 2018-07-12 MED ORDER — SODIUM CHLORIDE 0.9 % IV BOLUS
500.0000 mL | Freq: Once | INTRAVENOUS | Status: AC
  Administered 2018-07-12: 500 mL via INTRAVENOUS

## 2018-07-12 NOTE — ED Notes (Signed)
States her heart doesn't feel like its racing now however she can feel it in her ears.

## 2018-07-12 NOTE — ED Triage Notes (Addendum)
Pt states she was here for angioedeme yesterday, taken off of lisinopril and placed on another medication (olmesartan per pts chart), states she started feeling palpitations and like she could feel her heartbeat in her ears last night. Denies pain.

## 2018-07-12 NOTE — ED Provider Notes (Signed)
[facility] DEPARTMENT Provider Note   CSN: [id number] Arrival date & time: 07/12/18  4174     History   Chief Complaint Chief Complaint  Patient presents with  . Palpitations    HPI Allison Franco is a 77 y.o. female.  HPI Patient was just seen yesterday for angioedema thought to be due to lisinopril.  She was observed and advised to stop taking her lisinopril and started on olmesartan.  Patient states she took her medication as prescribed.  She woke this morning with palpitations and hearing her heartbeat in her right ear.  States she was restless.  She denied shortness of breath or chest pain.  She did endorse some lightheadedness especially with standing.  She had urinary frequency but no dysuria or hematuria.  Denies headache, neck pain or stiffness.  No focal weakness or numbness. Past Medical History:  Diagnosis Date  . Anxiety   . Constipation   . Depression    "following husband's death"  . Diabetes mellitus without complication ([place])   . Hypertension   . Tinnitus     Patient Active Problem List   Diagnosis Date Noted  . Other headache syndrome 07/03/2018  . Essential hypertension 07/03/2018  . Uncontrolled diabetes mellitus type 2 without complications ([place]) 07/11/4817  . Grief reaction 10/20/2013  . Hyponatremia 10/20/2013  . Obesity 10/20/2013  . Atypical chest pain 09/01/2013  . ALLERGIC RHINITIS 08/11/2009  . KNEE PAIN, BILATERAL 02/13/2009  . COUGH 02/13/2009  . INSOMNIA 07/17/2008  . TIREDNESS 04/16/2008  . HYPERTENSION 01/24/2008  . BACK PAIN, LUMBAR 01/22/2008  . HYPERGLYCEMIA, BORDERLINE 01/27/2007    Past Surgical History:  Procedure Laterality Date  . ABDOMINAL HYSTERECTOMY    . fribroid surgery       OB History   None      Home Medications    Prior to Admission medications   Medication Sig Start Date End Date Taking? Authorizing Provider  acetaminophen (TYLENOL) 325 MG tablet Take 325-650 mg by mouth  every 6 (six) hours as needed for mild pain, moderate pain or headache. Reported on 05/10/2016    [provider]  ALPRAZolam Duanne Moron) 0.25 MG tablet Take 1 tablet (0.25 mg total) by mouth 3 (three) times daily as needed for anxiety. 07/12/18   Julianne Rice, MD  blood glucose meter kit and supplies KIT Dispense based on patient and insurance preference. Use up to four times daily as directed. (FOR ICD-9 250.00, 250.01). 07/05/18   Florencia Reasons, MD  clobetasol ointment (TEMOVATE) 5.63 % Apply 1 application topically 2 (two) times daily. Patient taking differently: Apply 1 application topically 2 (two) times daily as needed (dermatosis).  06/14/18   Raylene Everts, MD  diphenhydrAMINE (BENADRYL) 25 mg capsule Take 1 capsule (25 mg total) by mouth at bedtime as needed for allergies. 12/01/17   Tasia Catchings, Amy V, PA-C  EPINEPHrine (EPIPEN 2-PAK) 0.3 mg/0.3 mL IJ SOAJ injection Inject 0.3 mLs (0.3 mg total) into the muscle once as needed (for severe allergic reaction). CAll 911 immediately if you have to use this medicine 09/10/17   Larene Pickett, PA-C  fluticasone [facility]) 50 MCG/ACT nasal spray Place 2 sprays into both nostrils daily. Patient taking differently: Place 2 sprays into both nostrils daily as needed for allergies.  01/05/18   Tasia Catchings, Amy V, PA-C  insulin detemir (LEVEMIR) 100 UNIT/ML injection Inject 0.1 mLs (10 Units total) into the skin 2 (two) times daily. 07/05/18   Florencia Reasons, MD  linagliptin [facility])  5 MG TABS tablet Take 1 tablet (5 mg total) by mouth daily. 07/05/18   Florencia Reasons, MD  metFORMIN (GLUCOPHAGE) 500 MG tablet Take 1 tablet (500 mg total) by mouth 2 (two) times daily with a meal. 07/05/18 07/05/19  Florencia Reasons, MD  Multiple Vitamin (MULTIVITAMIN WITH MINERALS) TABS tablet Take 1 tablet by mouth daily.    [provider]  olmesartan (BENICAR) 20 MG tablet Take 1 tablet (20 mg total) by mouth daily. 07/11/18   Domenic Moras, PA-C  simvastatin (ZOCOR) 10 MG tablet Take 10 mg by mouth daily.     [provider]  zolpidem (AMBIEN) 5 MG tablet Take 1 tablet (5 mg total) by mouth at bedtime as needed for sleep. 10/31/17   Robyn Haber, MD    Family History Family History  Problem Relation Age of Onset  . Diabetes Mother   . Hypertension Father     Social History Social History   Tobacco Use  . Smoking status: Never Smoker  . Smokeless tobacco: Never Used  Substance Use Topics  . Alcohol use: No  . Drug use: No     Allergies   Aspirin; Hydroxyzine; and Valium [diazepam]   Review of Systems Review of Systems  Constitutional: Negative for chills and fever.  HENT: Positive for tinnitus. Negative for ear pain, facial swelling and trouble swallowing.   Eyes: Negative for visual disturbance.  Respiratory: Negative for cough and shortness of breath.   Cardiovascular: Positive for palpitations. Negative for chest pain and leg swelling.  Gastrointestinal: Negative for abdominal pain, constipation, diarrhea, nausea and vomiting.  Genitourinary: Negative for dysuria, flank pain, frequency and hematuria.  Musculoskeletal: Negative for back pain, myalgias and neck pain.  Skin: Negative for rash and wound.  Neurological: Positive for light-headedness. Negative for syncope, weakness, numbness and headaches.  Psychiatric/Behavioral: The patient is nervous/anxious.   All other systems reviewed and are negative.    Physical Exam Updated Vital Signs BP 108/64   Pulse 81   Temp 97.6 F (36.4 C)   Resp (!) 24   SpO2 97%   Physical Exam  Constitutional: She is oriented to person, place, and time. She appears well-developed and well-nourished. No distress.  HENT:  Head: Normocephalic and atraumatic.  Mouth/Throat: Oropharynx is clear and moist. No oropharyngeal exudate.  Sclerotic right TM  Eyes: Pupils are equal, round, and reactive to light. EOM are normal.  Neck: Normal range of motion. Neck supple. No JVD present.  Cardiovascular: Regular rhythm. Exam  reveals no gallop and no friction rub.  No murmur heard. tachycardia  Pulmonary/Chest: Effort normal and breath sounds normal. No stridor. No respiratory distress. She has no wheezes. She has no rales. She exhibits no tenderness.  Abdominal: Soft. Bowel sounds are normal. There is no tenderness. There is no rebound and no guarding.  Musculoskeletal: Normal range of motion. She exhibits no edema or tenderness.  No lower extremity swelling, asymmetry or tenderness.  Lymphadenopathy:    She has no cervical adenopathy.  Neurological: She is alert and oriented to person, place, and time.  Moves all extremities without focal deficit.  Sensation fully intact.  Skin: Skin is warm and dry. Capillary refill takes less than 2 seconds. No rash noted. She is not diaphoretic. No erythema.  Psychiatric: Her behavior is normal.  Anxious appearing  Nursing note and vitals reviewed.    ED Treatments / Results  Labs (all labs ordered are listed, but only abnormal results are displayed) Labs Reviewed  COMPREHENSIVE METABOLIC  PANEL - Abnormal; Notable for the following components:      Result Value   Sodium 131 (*)    Glucose, Bld 315 (*)    Calcium 8.7 (*)    Total Protein 5.9 (*)    Albumin 3.0 (*)    Total Bilirubin 1.4 (*)    All other components within normal limits  URINALYSIS, ROUTINE W REFLEX MICROSCOPIC - Abnormal; Notable for the following components:   Color, Urine STRAW (*)    Glucose, UA >=500 (*)    Leukocytes, UA TRACE (*)    All other components within normal limits  CBC WITH DIFFERENTIAL/PLATELET  TSH  T4, FREE    EKG EKG Interpretation  Date/Time:  Thursday July 12 2018 06:39:23 EDT Ventricular Rate:  107 PR Interval:  160 QRS Duration: 74 QT Interval:  310 QTC Calculation: 413 R Axis:   17 Text Interpretation:  Sinus tachycardia Possible Left atrial enlargement Anterior infarct , age undetermined Abnormal ECG Confirmed by Julianne Rice [phone]) on 07/12/2018  9:13:25 AM   Radiology No results found.  Procedures Procedures (including critical care time)  Medications Ordered in ED Medications  sodium chloride 0.9 % bolus 500 mL (0 mLs Intravenous Stopped 07/12/18 0954)  sodium chloride 0.9 % bolus 500 mL (0 mLs Intravenous Stopped 07/12/18 1128)     Initial Impression / Assessment and Plan / ED Course  I have reviewed the triage vital signs and the nursing notes.  Pertinent labs & imaging results that were available during my care of the patient were reviewed by me and considered in my medical decision making (see chart for details).    Mild tachycardia has improved with IV fluids.  Suspect some degree of dehydration due to poorly controlled diabetes.  Patient also states she has had ongoing anxiety and depression since her husband died 4 years ago.  This is likely contributing to her symptomatology.  We will give resource list for psychiatric outpatient treatment.  Currently no SI.    Final Clinical Impressions(s) / ED Diagnoses   Final diagnoses:  Palpitations  Dehydration  Anxiety    ED Discharge Orders         Ordered    ALPRAZolam (XANAX) 0.25 MG tablet  3 times daily PRN     07/12/18 1155           Julianne Rice, MD 07/12/18 1157

## 2018-07-13 ENCOUNTER — Encounter: Payer: Self-pay | Admitting: Dietician

## 2018-07-13 ENCOUNTER — Encounter: Payer: Medicare Other | Attending: Nurse Practitioner | Admitting: Dietician

## 2018-07-13 DIAGNOSIS — Z713 Dietary counseling and surveillance: Secondary | ICD-10-CM | POA: Diagnosis not present

## 2018-07-13 DIAGNOSIS — Z6830 Body mass index (BMI) 30.0-30.9, adult: Secondary | ICD-10-CM | POA: Insufficient documentation

## 2018-07-13 DIAGNOSIS — I10 Essential (primary) hypertension: Secondary | ICD-10-CM | POA: Diagnosis not present

## 2018-07-13 DIAGNOSIS — IMO0001 Reserved for inherently not codable concepts without codable children: Secondary | ICD-10-CM

## 2018-07-13 DIAGNOSIS — Z09 Encounter for follow-up examination after completed treatment for conditions other than malignant neoplasm: Secondary | ICD-10-CM | POA: Diagnosis not present

## 2018-07-13 DIAGNOSIS — E1165 Type 2 diabetes mellitus with hyperglycemia: Secondary | ICD-10-CM | POA: Insufficient documentation

## 2018-07-13 NOTE — Progress Notes (Signed)
Diabetes Self-Management Education  Visit Type: First/Initial  Appt. Start Time: 1430 Appt. End Time: 1600  07/13/2018  [name], identified by name and date of birth, is a 77 y.o. female with a diagnosis of Diabetes: Type 2.  Weight 173 lbs today decreased from UBW 189 lbs. Medication includes:  Levemir 15 units bid Tradjenta- unsure if she is taking this.  And Metformin- no problems. Patient states that she is allergic to uncooked sugar and avoids.  She has stopped eating fu fu and reduced rice, bread, potatoes and other sweets.  Patient is here today alone.  She states that she takes the bus and walks wherever she goes.  Her husband died in March 10, 2013.  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  She moved from [place] in Mar 10, 2006.  She does not require an interpretor and is very interested in learning but some language barrier and cannot read fine print.  She will require increased education.  ASSESSMENT  Height 5\' 3"  (1.6 m), weight 173 lb (78.5 kg). Body mass index is 30.65 kg/m.  Diabetes Self-Management Education - 07/13/18 1454      Visit Information   Visit Type  First/Initial      Initial Visit   Diabetes Type  Type 2    Are you currently following a meal plan?  No    Are you taking your medications as prescribed?  Yes    Date Diagnosed  06/1018      Health Coping   How would you rate your overall health?  Good      Psychosocial Assessment   Patient Belief/Attitude about Diabetes  Motivated to manage diabetes   denial at first   Self-care barriers  English as a second language    Self-management support  Doctor's office;Friends    Other persons present  Patient    Patient Concerns  Nutrition/Meal planning;Glycemic Control    Special Needs  None    Preferred Learning Style  No preference indicated    Learning Readiness  Ready    How often do you need to have someone help you when you read instructions, pamphlets, or other written materials from  your doctor or pharmacy?  1 - Never    What is the last grade level you completed in school?  4 years college      Pre-Education Assessment   Patient understands the diabetes disease and treatment process.  Needs Instruction    Patient understands incorporating nutritional management into lifestyle.  Needs Instruction    Patient undertands incorporating physical activity into lifestyle.  Needs Instruction    Patient understands using medications safely.  Needs Instruction    Patient understands monitoring blood glucose, interpreting and using results  Needs Instruction    Patient understands prevention, detection, and treatment of acute complications.  Needs Instruction    Patient understands prevention, detection, and treatment of chronic complications.  Needs Instruction    Patient understands how to develop strategies to address psychosocial issues.  Needs Instruction    Patient understands how to develop strategies to promote health/change behavior.  Needs Instruction      Complications   Last HgB A1C per patient/outside source  15.7 %   07/03/18   How often do you check your blood sugar?  0 times/day (not testing)    Fasting Blood glucose range (mg/dL)  >200;180-200    Postprandial Blood glucose range (mg/dL)  >200    Number of hypoglycemic episodes per month  0  Number of hyperglycemic episodes per week  21    Can you tell when your blood sugar is high?  Yes    What do you do if your blood sugar is high?  nothing    Have you had a dilated eye exam in the past 12 months?  No    Have you had a dental exam in the past 12 months?  No    Are you checking your feet?  No      Dietary Intake   Breakfast  oatmeal with milk, plain green yogurt, tangerine    Snack (morning)  none    Lunch  Wrap with grilled chicken OR salad, tuna with bread, 1% milk    Snack (afternoon)  none    Dinner   vegetable soup OR milk and plain greek yogurt OR foo foo (plaintain) in soup OR corn mush, steamed  chicken vegetables    Snack (evening)  none    Beverage(s)  water, regular sprite or [place] soda      Exercise   Exercise Type  Light (walking / raking leaves)    How many days per week to you exercise?  7    How many minutes per day do you exercise?  60    Total minutes per week of exercise  420      Patient Education   Previous Diabetes Education  No    Disease state   Definition of diabetes, type 1 and 2, and the diagnosis of diabetes;Factors that contribute to the development of diabetes    Nutrition management   Role of diet in the treatment of diabetes and the relationship between the three main macronutrients and blood glucose level;Meal options for control of blood glucose level and chronic complications.    Physical activity and exercise   Role of exercise on diabetes management, blood pressure control and cardiac health.    Medications  Reviewed patients medication for diabetes, action, purpose, timing of dose and side effects.    Monitoring  Purpose and frequency of SMBG.;Identified appropriate SMBG and/or A1C goals.;Interpreting lab values - A1C, lipid, urine microalbumina.;Taught/evaluated SMBG meter.;Yearly dilated eye exam;Daily foot exams    Acute complications  Taught treatment of hypoglycemia - the 15 rule.    Chronic complications  Relationship between chronic complications and blood glucose control;Retinopathy and reason for yearly dilated eye exams;Assessed and discussed foot care and prevention of foot problems    Psychosocial adjustment  Worked with patient to identify barriers to care and solutions;Role of stress on diabetes;Identified and addressed patients feelings and concerns about diabetes    Personal strategies to promote health  Lifestyle issues that need to be addressed for better diabetes care      Individualized Goals (developed by patient)   Nutrition  General guidelines for healthy choices and portions discussed    Physical Activity  Exercise 5-7 days per  week;60 minutes per day    Medications  take my medication as prescribed    Monitoring   test my blood glucose as discussed    Reducing Risk  examine blood glucose patterns    Health Coping  discuss diabetes with (comment)   MD, RD, CDE     Post-Education Assessment   Patient understands the diabetes disease and treatment process.  Demonstrates understanding / competency    Patient understands incorporating nutritional management into lifestyle.  Needs Review    Patient undertands incorporating physical activity into lifestyle.  Demonstrates understanding / competency    Patient  understands using medications safely.  Needs Review    Patient understands monitoring blood glucose, interpreting and using results  Needs Review    Patient understands prevention, detection, and treatment of acute complications.  Needs Review    Patient understands prevention, detection, and treatment of chronic complications.  Demonstrates understanding / competency    Patient understands how to develop strategies to address psychosocial issues.  Demonstrates understanding / competency    Patient understands how to develop strategies to promote health/change behavior.  Needs Review      Outcomes   Expected Outcomes  Demonstrated interest in learning. Expect positive outcomes    Future DMSE  2 wks    Program Status  Not Completed       Individualized Plan for Diabetes Self-Management Training:   Learning Objective:  Patient will have a greater understanding of diabetes self-management. Patient education plan is to attend individual and/or group sessions per assessed needs and concerns.   Plan:   Patient Instructions  Call for an eye appointment  [facility] could be one option   [phone]   Ask if they take Medicare/Medicaid  Are you taking the Tradjenta?  See your pharmacist. Check your blood sugar as directed by your doctor.  Do you have enough drums for the lancing device?  Do you have enough  strips for your meter?  Add very little grease (oil) when cooking. Avoid all drinks with sugar (soda, [place], juice) Eat a lot of vegetables, small amounts of meat or choose beans, very little added fat.  Choose a little avocado or nuts instead of oil.     Expected Outcomes:  Demonstrated interest in learning. Expect positive outcomes  Education material provided: ADA Diabetes: Your Take Control Guide, Meal plan card and My Plate  If problems or questions, patient to contact team via:  Phone  Future DSME appointment: 2 wks

## 2018-07-13 NOTE — Patient Instructions (Addendum)
Call for an eye appointment  [facility] could be one option   [phone]   Ask if they take Medicare/Medicaid  Are you taking the Tradjenta?  See your pharmacist. Check your blood sugar as directed by your doctor.  Do you have enough drums for the lancing device?  Do you have enough strips for your meter?  Add very little grease (oil) when cooking. Avoid all drinks with sugar (soda, [place], juice) Eat a lot of vegetables, small amounts of meat or choose beans, very little added fat.  Choose a little avocado or nuts instead of oil.

## 2018-07-16 DIAGNOSIS — G4700 Insomnia, unspecified: Secondary | ICD-10-CM | POA: Diagnosis not present

## 2018-07-16 DIAGNOSIS — F419 Anxiety disorder, unspecified: Secondary | ICD-10-CM | POA: Diagnosis not present

## 2018-07-16 DIAGNOSIS — G4489 Other headache syndrome: Secondary | ICD-10-CM | POA: Diagnosis not present

## 2018-07-16 DIAGNOSIS — F329 Major depressive disorder, single episode, unspecified: Secondary | ICD-10-CM | POA: Diagnosis not present

## 2018-07-16 DIAGNOSIS — I10 Essential (primary) hypertension: Secondary | ICD-10-CM | POA: Diagnosis not present

## 2018-07-16 DIAGNOSIS — E1165 Type 2 diabetes mellitus with hyperglycemia: Secondary | ICD-10-CM | POA: Diagnosis not present

## 2018-07-18 ENCOUNTER — Encounter (HOSPITAL_COMMUNITY): Payer: Self-pay | Admitting: *Deleted

## 2018-07-18 ENCOUNTER — Other Ambulatory Visit: Payer: Self-pay

## 2018-07-18 ENCOUNTER — Emergency Department (HOSPITAL_COMMUNITY)
Admission: EM | Admit: 2018-07-18 | Discharge: 2018-07-19 | Disposition: A | Discharge status: Home / Self Care | Payer: Medicare Other | Attending: Emergency Medicine | Admitting: Emergency Medicine

## 2018-07-18 ENCOUNTER — Emergency Department (HOSPITAL_COMMUNITY): Payer: Medicare Other

## 2018-07-18 DIAGNOSIS — Z5321 Procedure and treatment not carried out due to patient leaving prior to being seen by health care provider: Secondary | ICD-10-CM | POA: Diagnosis not present

## 2018-07-18 DIAGNOSIS — R079 Chest pain, unspecified: Secondary | ICD-10-CM | POA: Diagnosis not present

## 2018-07-18 DIAGNOSIS — F419 Anxiety disorder, unspecified: Secondary | ICD-10-CM | POA: Diagnosis not present

## 2018-07-18 DIAGNOSIS — R42 Dizziness and giddiness: Secondary | ICD-10-CM | POA: Diagnosis not present

## 2018-07-18 DIAGNOSIS — E1165 Type 2 diabetes mellitus with hyperglycemia: Secondary | ICD-10-CM | POA: Diagnosis not present

## 2018-07-18 DIAGNOSIS — G4700 Insomnia, unspecified: Secondary | ICD-10-CM | POA: Diagnosis not present

## 2018-07-18 DIAGNOSIS — G4489 Other headache syndrome: Secondary | ICD-10-CM | POA: Diagnosis not present

## 2018-07-18 DIAGNOSIS — R200 Anesthesia of skin: Secondary | ICD-10-CM | POA: Insufficient documentation

## 2018-07-18 DIAGNOSIS — F329 Major depressive disorder, single episode, unspecified: Secondary | ICD-10-CM | POA: Diagnosis not present

## 2018-07-18 DIAGNOSIS — I10 Essential (primary) hypertension: Secondary | ICD-10-CM | POA: Diagnosis not present

## 2018-07-18 LAB — CBC
HEMATOCRIT: 36.2 % (ref 36.0–46.0)
Hemoglobin: 10.9 g/dL — ABNORMAL LOW (ref 12.0–15.0)
MCH: 27.5 pg (ref 26.0–34.0)
MCHC: 30.1 g/dL (ref 30.0–36.0)
MCV: 91.2 fL (ref 78.0–100.0)
Platelets: 253 10*3/uL (ref 150–400)
RBC: 3.97 MIL/uL (ref 3.87–5.11)
RDW: 13.2 % (ref 11.5–15.5)
WBC: 8.8 10*3/uL (ref 4.0–10.5)

## 2018-07-18 LAB — BASIC METABOLIC PANEL
Anion gap: 11 (ref 5–15)
BUN: 15 mg/dL (ref 8–23)
CHLORIDE: 98 mmol/L (ref 98–111)
CO2: 24 mmol/L (ref 22–32)
Calcium: 8.9 mg/dL (ref 8.9–10.3)
Creatinine, Ser: 0.77 mg/dL (ref 0.44–1.00)
Glucose, Bld: 158 mg/dL — ABNORMAL HIGH (ref 70–99)
POTASSIUM: 4.2 mmol/L (ref 3.5–5.1)
Sodium: 133 mmol/L — ABNORMAL LOW (ref 135–145)

## 2018-07-18 LAB — TROPONIN I: Troponin I: <0.03 ng/mL (ref <0.03)

## 2018-07-18 NOTE — ED Triage Notes (Signed)
The pt is c/o dizziness leg numbness chest discomfort for months  She reports that she comes here all the time for the same complaints.

## 2018-07-19 DIAGNOSIS — R42 Dizziness and giddiness: Secondary | ICD-10-CM | POA: Diagnosis not present

## 2018-07-19 NOTE — ED Notes (Signed)
Pt wants to leave. Pt refuses to speak to nurse before leaving. It is explained to pt that she will be getting a room soon. Pt refuses to stay. Pt demands taxi be called. Pt seen leaving lobby and taking taxi.

## 2018-07-20 ENCOUNTER — Emergency Department (HOSPITAL_COMMUNITY)
Admission: EM | Admit: 2018-07-20 | Discharge: 2018-07-21 | Disposition: A | Discharge status: Home / Self Care | Payer: Medicare Other | Attending: Emergency Medicine | Admitting: Emergency Medicine

## 2018-07-20 ENCOUNTER — Emergency Department (HOSPITAL_COMMUNITY): Payer: Medicare Other

## 2018-07-20 ENCOUNTER — Other Ambulatory Visit: Payer: Self-pay

## 2018-07-20 ENCOUNTER — Encounter (HOSPITAL_COMMUNITY): Payer: Self-pay | Admitting: *Deleted

## 2018-07-20 DIAGNOSIS — Z794 Long term (current) use of insulin: Secondary | ICD-10-CM | POA: Insufficient documentation

## 2018-07-20 DIAGNOSIS — R079 Chest pain, unspecified: Secondary | ICD-10-CM | POA: Insufficient documentation

## 2018-07-20 DIAGNOSIS — E119 Type 2 diabetes mellitus without complications: Secondary | ICD-10-CM | POA: Insufficient documentation

## 2018-07-20 DIAGNOSIS — G4700 Insomnia, unspecified: Secondary | ICD-10-CM | POA: Diagnosis not present

## 2018-07-20 DIAGNOSIS — H81399 Other peripheral vertigo, unspecified ear: Secondary | ICD-10-CM

## 2018-07-20 DIAGNOSIS — I10 Essential (primary) hypertension: Secondary | ICD-10-CM | POA: Diagnosis not present

## 2018-07-20 DIAGNOSIS — F419 Anxiety disorder, unspecified: Secondary | ICD-10-CM | POA: Diagnosis not present

## 2018-07-20 DIAGNOSIS — G4489 Other headache syndrome: Secondary | ICD-10-CM | POA: Diagnosis not present

## 2018-07-20 DIAGNOSIS — F329 Major depressive disorder, single episode, unspecified: Secondary | ICD-10-CM | POA: Diagnosis not present

## 2018-07-20 DIAGNOSIS — R42 Dizziness and giddiness: Secondary | ICD-10-CM | POA: Diagnosis present

## 2018-07-20 DIAGNOSIS — R1013 Epigastric pain: Secondary | ICD-10-CM | POA: Diagnosis not present

## 2018-07-20 DIAGNOSIS — E1165 Type 2 diabetes mellitus with hyperglycemia: Secondary | ICD-10-CM | POA: Diagnosis not present

## 2018-07-20 DIAGNOSIS — D649 Anemia, unspecified: Secondary | ICD-10-CM | POA: Diagnosis not present

## 2018-07-20 DIAGNOSIS — Z79899 Other long term (current) drug therapy: Secondary | ICD-10-CM | POA: Diagnosis not present

## 2018-07-20 LAB — CBC
HCT: 35.4 % — ABNORMAL LOW (ref 36.0–46.0)
Hemoglobin: 10.8 g/dL — ABNORMAL LOW (ref 12.0–15.0)
MCH: 28.1 pg (ref 26.0–34.0)
MCHC: 30.5 g/dL (ref 30.0–36.0)
MCV: 92.2 fL (ref 78.0–100.0)
Platelets: 290 10*3/uL (ref 150–400)
RBC: 3.84 MIL/uL — ABNORMAL LOW (ref 3.87–5.11)
RDW: 14.3 % (ref 11.5–15.5)
WBC: 9.1 10*3/uL (ref 4.0–10.5)

## 2018-07-20 LAB — TROPONIN I: Troponin I: <0.03 ng/mL

## 2018-07-20 LAB — BASIC METABOLIC PANEL WITH GFR
Anion gap: 11 (ref 5–15)
BUN: 13 mg/dL (ref 8–23)
CO2: 26 mmol/L (ref 22–32)
Calcium: 9.4 mg/dL (ref 8.9–10.3)
Chloride: 95 mmol/L — ABNORMAL LOW (ref 98–111)
Creatinine, Ser: 0.71 mg/dL (ref 0.44–1.00)
GFR calc Af Amer: >60 mL/min
GFR calc non Af Amer: >60 mL/min
Glucose, Bld: 157 mg/dL — ABNORMAL HIGH (ref 70–99)
Potassium: 3.7 mmol/L (ref 3.5–5.1)
Sodium: 132 mmol/L — ABNORMAL LOW (ref 135–145)

## 2018-07-20 NOTE — ED Triage Notes (Signed)
The pt is c/o being dizzy for 3-4 days and some chest pain she was here 2-3 days ago but was not seen she left after she had waited a long time

## 2018-07-20 NOTE — ED Provider Notes (Signed)
Patient placed in [facility] pathway, seen and evaluated   Chief Complaint: Dizziness, can't sleep  HPI:   Patient reports that she has been dizzy and can't sleep.  Her symptoms have been going on for weeks.  She was seen for this days ago and left AMA.    ROS: No fevers  Physical Exam:   Gen: No distress  Neuro: Awake and Alert  Skin: Warm    Focused Exam: Patient has normal speech.    Initiation of care has begun. The patient has been counseled on the process, plan, and necessity for staying for the completion/evaluation, and the remainder of the medical screening examination    Ollen Gross 81/68/38 7065    Delora Fuel, MD 82/60/88 [phone]

## 2018-07-21 DIAGNOSIS — H81399 Other peripheral vertigo, unspecified ear: Secondary | ICD-10-CM | POA: Diagnosis not present

## 2018-07-21 LAB — I-STAT TROPONIN, ED: Troponin i, poc: 0.02 ng/mL (ref 0.00–0.08)

## 2018-07-21 MED ORDER — MECLIZINE HCL 25 MG PO TABS: 25.0000 mg | ORAL_TABLET | Freq: Three times a day (TID) | ORAL | 0 refills | Status: DC | PRN

## 2018-07-21 MED ORDER — MECLIZINE HCL 25 MG PO TABS
25.0000 mg | ORAL_TABLET | Freq: Once | ORAL | Status: AC
  Administered 2018-07-21: 25 mg via ORAL
  Filled 2018-07-21: qty 1

## 2018-07-21 MED ORDER — PANTOPRAZOLE SODIUM 40 MG PO TBEC: 40.0000 mg | DELAYED_RELEASE_TABLET | Freq: Every day | ORAL | 0 refills | Status: DC

## 2018-07-21 MED ORDER — ZOLPIDEM TARTRATE 5 MG PO TABS: 5.0000 mg | ORAL_TABLET | Freq: Every evening | ORAL | 0 refills | Status: DC | PRN

## 2018-07-21 MED ORDER — PANTOPRAZOLE SODIUM 40 MG PO TBEC
40.0000 mg | DELAYED_RELEASE_TABLET | Freq: Once | ORAL | Status: AC
  Administered 2018-07-21: 40 mg via ORAL
  Filled 2018-07-21: qty 1

## 2018-07-21 NOTE — ED Provider Notes (Signed)
[facility] EMERGENCY DEPARTMENT Provider Note   CSN: [id number] Arrival date & time: 07/20/18  2025     History   Chief Complaint Chief Complaint  Patient presents with  . Dizziness  . Chest Pain    HPI Allison Franco is a 77 y.o. female.  The history is provided by the patient.  She has history of hypertension, diabetes, insomnia and comes in complaining of dizziness for the last 3 days.  Dizziness is described as a spinning sensation which is worse if she lays flat.  There is associated nausea but no vomiting.  She denies any visual disturbance and denies ear pain or tinnitus or hearing loss.  She is also complaining of epigastric and chest pain after she eats.  Pain will last for several hours.  There is no associated dyspnea, nausea, diaphoresis.  This has been bothering her for about a month.  She also relates chronic insomnia.  She states that she has been depressed and had difficulty sleeping since her husband died 5 years ago.  She is a non-smoker.  There is no history of hyperlipidemia.  Past Medical History:  Diagnosis Date  . Anxiety   . Constipation   . Depression    "following husband's death"  . Diabetes mellitus without complication ([place])   . Hypertension   . Tinnitus     Patient Active Problem List   Diagnosis Date Noted  . Other headache syndrome 07/03/2018  . Essential hypertension 07/03/2018  . Uncontrolled diabetes mellitus type 2 without complications ([place]) 96/22/2979  . Grief reaction 10/20/2013  . Hyponatremia 10/20/2013  . Obesity 10/20/2013  . Atypical chest pain 09/01/2013  . ALLERGIC RHINITIS 08/11/2009  . KNEE PAIN, BILATERAL 02/13/2009  . COUGH 02/13/2009  . INSOMNIA 07/17/2008  . TIREDNESS 04/16/2008  . HYPERTENSION 01/24/2008  . BACK PAIN, LUMBAR 01/22/2008  . HYPERGLYCEMIA, BORDERLINE 01/27/2007    Past Surgical History:  Procedure Laterality Date  . ABDOMINAL HYSTERECTOMY    . fribroid surgery       OB History    None      Home Medications    Prior to Admission medications   Medication Sig Start Date End Date Taking? Authorizing Provider  acetaminophen (TYLENOL) 325 MG tablet Take 325-650 mg by mouth every 6 (six) hours as needed for mild pain, moderate pain or headache. Reported on 05/10/2016    [provider]  ALPRAZolam Duanne Moron) 0.25 MG tablet Take 1 tablet (0.25 mg total) by mouth 3 (three) times daily as needed for anxiety. 07/12/18   Julianne Rice, MD  blood glucose meter kit and supplies KIT Dispense based on patient and insurance preference. Use up to four times daily as directed. (FOR ICD-9 250.00, 250.01). 07/05/18   Florencia Reasons, MD  calcium carbonate (OSCAL) 1500 (600 Ca) MG TABS tablet Take by mouth 2 (two) times daily with a meal.    [provider]  cholecalciferol (VITAMIN D) 1000 units tablet Take 2,000 Units by mouth daily.    [provider]  clobetasol ointment (TEMOVATE) 8.92 % Apply 1 application topically 2 (two) times daily. Patient taking differently: Apply 1 application topically 2 (two) times daily as needed (dermatosis).  06/14/18   Raylene Everts, MD  diphenhydrAMINE (BENADRYL) 25 mg capsule Take 1 capsule (25 mg total) by mouth at bedtime as needed for allergies. 12/01/17   Tasia Catchings, Amy V, PA-C  EPINEPHrine (EPIPEN 2-PAK) 0.3 mg/0.3 mL IJ SOAJ injection Inject 0.3 mLs (0.3 mg total) into the  muscle once as needed (for severe allergic reaction). CAll 911 immediately if you have to use this medicine 09/10/17   Larene Pickett, PA-C  fluticasone [facility]) 50 MCG/ACT nasal spray Place 2 sprays into both nostrils daily. Patient taking differently: Place 2 sprays into both nostrils daily as needed for allergies.  01/05/18   Tasia Catchings, Amy V, PA-C  insulin detemir (LEVEMIR) 100 UNIT/ML injection Inject 0.1 mLs (10 Units total) into the skin 2 (two) times daily. 07/05/18   Florencia Reasons, MD  linagliptin (TRADJENTA) 5 MG TABS tablet Take 1 tablet (5 mg total) by mouth  daily. Patient not taking: Reported on 07/13/2018 07/05/18   Florencia Reasons, MD  metFORMIN (GLUCOPHAGE) 500 MG tablet Take 1 tablet (500 mg total) by mouth 2 (two) times daily with a meal. 07/05/18 07/05/19  Florencia Reasons, MD  Multiple Vitamin (MULTIVITAMIN WITH MINERALS) TABS tablet Take 1 tablet by mouth daily.    [provider]  olmesartan (BENICAR) 20 MG tablet Take 1 tablet (20 mg total) by mouth daily. 07/11/18   Domenic Moras, PA-C  simvastatin (ZOCOR) 10 MG tablet Take 10 mg by mouth daily.    [provider]  zolpidem (AMBIEN) 5 MG tablet Take 1 tablet (5 mg total) by mouth at bedtime as needed for sleep. 10/31/17   Robyn Haber, MD    Family History Family History  Problem Relation Age of Onset  . Diabetes Mother   . Hypertension Father     Social History Social History   Tobacco Use  . Smoking status: Never Smoker  . Smokeless tobacco: Never Used  Substance Use Topics  . Alcohol use: No  . Drug use: No     Allergies   Aspirin; Hydroxyzine; and Valium [diazepam]   Review of Systems Review of Systems  All other systems reviewed and are negative.    Physical Exam Updated Vital Signs BP 126/72 (BP Location: Right Arm)   Pulse 89   Temp 98.6 F (37 C)   Resp 18   Ht 5' 3" (1.6 m)   Wt 79.4 kg   SpO2 98%   BMI 31.00 kg/m   Physical Exam  Nursing note and vitals reviewed.  77 year old female, resting comfortably and in no acute distress. Vital signs are normal. Oxygen saturation is 98%, which is normal. Head is normocephalic and atraumatic. PERRLA, EOMI. Oropharynx is clear. Neck is nontender and supple without adenopathy or JVD. Back is nontender and there is no CVA tenderness. Lungs are clear without rales, wheezes, or rhonchi. Chest is nontender. Heart has regular rate and rhythm without murmur. Abdomen is soft, flat, nontender without masses or hepatosplenomegaly and peristalsis is normoactive. Extremities have no cyanosis or edema, full range of  motion is present. Skin is warm and dry without rash. Neurologic: Mental status is normal, cranial nerves are intact, there are no motor or sensory deficits.  Dizziness is reproduced with passive head movement.  ED Treatments / Results  Labs (all labs ordered are listed, but only abnormal results are displayed) Labs Reviewed  BASIC METABOLIC PANEL - Abnormal; Notable for the following components:      Result Value   Sodium 132 (*)    Chloride 95 (*)    Glucose, Bld 157 (*)    All other components within normal limits  CBC - Abnormal; Notable for the following components:   RBC 3.84 (*)    Hemoglobin 10.8 (*)    HCT 35.4 (*)    All other components  within normal limits  TROPONIN I  I-STAT TROPONIN, ED  I-STAT TROPONIN, ED    EKG EKG Interpretation  Date/Time:  Friday July 20 2018 20:30:10 EDT Ventricular Rate:  98 PR Interval:  174 QRS Duration: 78 QT Interval:  324 QTC Calculation: 413 R Axis:   -25 Text Interpretation:  Normal sinus rhythm Possible Left atrial enlargement Low voltage QRS Borderline ECG When compared with ECG of 07/19/2018, No significant change was found Confirmed by Delora Fuel ([id number]) on 07/21/2018 1:34:03 AM   Radiology Dg Chest 2 View  Result Date: 07/20/2018 CLINICAL DATA:  Chest pain and dizziness EXAM: CHEST - 2 VIEW COMPARISON:  07/18/2018, 10/15/2017 FINDINGS: Minimal atelectasis at the left base. No focal consolidation or effusion. Normal cardiomediastinal silhouette. No pneumothorax. IMPRESSION: No active cardiopulmonary disease. Electronically Signed   By: Donavan Foil M.D.   On: 07/20/2018 21:47    Procedures Procedures   Medications Ordered in ED Medications  meclizine (ANTIVERT) tablet 25 mg (25 mg Oral Given 07/21/18 0153)  pantoprazole (PROTONIX) EC tablet 40 mg (40 mg Oral Given 07/21/18 0153)     Initial Impression / Assessment and Plan / ED Course  I have reviewed the triage vital signs and the nursing notes.  Pertinent  labs & imaging results that were available during my care of the patient were reviewed by me and considered in my medical decision making (see chart for details).  Dizziness seems most consistent with peripheral vertigo.  No signs of stroke.  Chest pain seems to be GI in origin.  ECG is unremarkable and chest x-ray shows no acute process.  Initial troponin is normal and will repeat troponin.  Mild anemia is present but unchanged from recent value.  Mild hyponatremia present and unchanged from recent values.  Old records are reviewed, and this is her eighth ED visit this month, although other visits were for hyperglycemia and headache and palpitations.  She will be given therapeutic trial of meclizine, will repeat troponin and will start her on pantoprazole.  She would likely benefit from psychiatric consultation so she is given outpatient mental health resources.  3:02 AM She feels much better after above-noted treatment.  Repeat troponin has decreased from baseline.  She is felt to be safe for discharge at this point.  She is discharged with prescription for meclizine, pantoprazole, and a small number of zolpidem tablets.  Given outpatient resources for psychiatric care.  Return precautions discussed.  Final Clinical Impressions(s) / ED Diagnoses   Final diagnoses:  Peripheral vertigo, unspecified laterality  Epigastric pain  Insomnia, unspecified type  Normochromic normocytic anemia    ED Discharge Orders         Ordered    zolpidem (AMBIEN) 5 MG tablet  At bedtime PRN     07/21/18 0202    pantoprazole (PROTONIX) 40 MG tablet  Daily     07/21/18 0202    meclizine (ANTIVERT) 25 MG tablet  3 times daily PRN,   Status:  Discontinued     07/21/18 0203    meclizine (ANTIVERT) 25 MG tablet  3 times daily PRN     07/21/18 9735           Delora Fuel, MD 32/99/24 0302

## 2018-07-23 DIAGNOSIS — I10 Essential (primary) hypertension: Secondary | ICD-10-CM | POA: Diagnosis not present

## 2018-07-23 DIAGNOSIS — Z09 Encounter for follow-up examination after completed treatment for conditions other than malignant neoplasm: Secondary | ICD-10-CM | POA: Diagnosis not present

## 2018-07-23 DIAGNOSIS — E1165 Type 2 diabetes mellitus with hyperglycemia: Secondary | ICD-10-CM | POA: Diagnosis not present

## 2018-07-23 DIAGNOSIS — Z794 Long term (current) use of insulin: Secondary | ICD-10-CM | POA: Diagnosis not present

## 2018-07-23 DIAGNOSIS — G4489 Other headache syndrome: Secondary | ICD-10-CM | POA: Diagnosis not present

## 2018-07-23 DIAGNOSIS — G4700 Insomnia, unspecified: Secondary | ICD-10-CM | POA: Diagnosis not present

## 2018-07-23 DIAGNOSIS — F419 Anxiety disorder, unspecified: Secondary | ICD-10-CM | POA: Diagnosis not present

## 2018-07-23 DIAGNOSIS — F329 Major depressive disorder, single episode, unspecified: Secondary | ICD-10-CM | POA: Diagnosis not present

## 2018-07-23 DIAGNOSIS — R42 Dizziness and giddiness: Secondary | ICD-10-CM | POA: Diagnosis not present

## 2018-07-24 ENCOUNTER — Encounter: Payer: Self-pay | Admitting: *Deleted

## 2018-07-24 DIAGNOSIS — E1165 Type 2 diabetes mellitus with hyperglycemia: Secondary | ICD-10-CM | POA: Diagnosis not present

## 2018-07-24 DIAGNOSIS — G4489 Other headache syndrome: Secondary | ICD-10-CM | POA: Diagnosis not present

## 2018-07-24 DIAGNOSIS — I10 Essential (primary) hypertension: Secondary | ICD-10-CM | POA: Diagnosis not present

## 2018-07-24 DIAGNOSIS — G4700 Insomnia, unspecified: Secondary | ICD-10-CM | POA: Diagnosis not present

## 2018-07-24 DIAGNOSIS — F329 Major depressive disorder, single episode, unspecified: Secondary | ICD-10-CM | POA: Diagnosis not present

## 2018-07-24 DIAGNOSIS — F419 Anxiety disorder, unspecified: Secondary | ICD-10-CM | POA: Diagnosis not present

## 2018-07-25 DIAGNOSIS — F329 Major depressive disorder, single episode, unspecified: Secondary | ICD-10-CM | POA: Diagnosis not present

## 2018-07-25 DIAGNOSIS — E1165 Type 2 diabetes mellitus with hyperglycemia: Secondary | ICD-10-CM | POA: Diagnosis not present

## 2018-07-25 DIAGNOSIS — F419 Anxiety disorder, unspecified: Secondary | ICD-10-CM | POA: Diagnosis not present

## 2018-07-25 DIAGNOSIS — I10 Essential (primary) hypertension: Secondary | ICD-10-CM | POA: Diagnosis not present

## 2018-07-25 DIAGNOSIS — G4489 Other headache syndrome: Secondary | ICD-10-CM | POA: Diagnosis not present

## 2018-07-25 DIAGNOSIS — G4700 Insomnia, unspecified: Secondary | ICD-10-CM | POA: Diagnosis not present

## 2018-07-26 DIAGNOSIS — E119 Type 2 diabetes mellitus without complications: Secondary | ICD-10-CM | POA: Diagnosis not present

## 2018-07-27 DIAGNOSIS — G4700 Insomnia, unspecified: Secondary | ICD-10-CM | POA: Diagnosis not present

## 2018-07-27 DIAGNOSIS — E1165 Type 2 diabetes mellitus with hyperglycemia: Secondary | ICD-10-CM | POA: Diagnosis not present

## 2018-07-27 DIAGNOSIS — F329 Major depressive disorder, single episode, unspecified: Secondary | ICD-10-CM | POA: Diagnosis not present

## 2018-07-27 DIAGNOSIS — F419 Anxiety disorder, unspecified: Secondary | ICD-10-CM | POA: Diagnosis not present

## 2018-07-27 DIAGNOSIS — E119 Type 2 diabetes mellitus without complications: Secondary | ICD-10-CM | POA: Diagnosis not present

## 2018-07-27 DIAGNOSIS — G4489 Other headache syndrome: Secondary | ICD-10-CM | POA: Diagnosis not present

## 2018-07-27 DIAGNOSIS — I10 Essential (primary) hypertension: Secondary | ICD-10-CM | POA: Diagnosis not present

## 2018-07-28 ENCOUNTER — Encounter (HOSPITAL_COMMUNITY): Payer: Self-pay | Admitting: Emergency Medicine

## 2018-07-28 ENCOUNTER — Emergency Department (HOSPITAL_COMMUNITY)
Admission: EM | Admit: 2018-07-28 | Discharge: 2018-07-28 | Disposition: A | Discharge status: Home / Self Care | Payer: Medicare Other | Attending: Emergency Medicine | Admitting: Emergency Medicine

## 2018-07-28 ENCOUNTER — Other Ambulatory Visit: Payer: Self-pay

## 2018-07-28 DIAGNOSIS — Z794 Long term (current) use of insulin: Secondary | ICD-10-CM | POA: Insufficient documentation

## 2018-07-28 DIAGNOSIS — I10 Essential (primary) hypertension: Secondary | ICD-10-CM | POA: Diagnosis not present

## 2018-07-28 DIAGNOSIS — IMO0001 Reserved for inherently not codable concepts without codable children: Secondary | ICD-10-CM

## 2018-07-28 DIAGNOSIS — E119 Type 2 diabetes mellitus without complications: Secondary | ICD-10-CM | POA: Insufficient documentation

## 2018-07-28 DIAGNOSIS — Z79899 Other long term (current) drug therapy: Secondary | ICD-10-CM | POA: Insufficient documentation

## 2018-07-28 LAB — CBG MONITORING, ED
GLUCOSE-CAPILLARY: 109 mg/dL — AB (ref 70–99)
Glucose-Capillary: 101 mg/dL — ABNORMAL HIGH (ref 70–99)

## 2018-07-28 NOTE — ED Triage Notes (Signed)
Pt states she couldn't sleep so she checked her blood sugar and it was 90 so she got scared and came to hospital.  Brother very concerned that patient has not received the education she needs to manage her diabetes and he feels that she does not need to be on Insulin and Metformin because he has been a diabetic for years and does not take insulin.  He states 100 is too low for her blood sugar and that she no longer needs insulin.

## 2018-07-28 NOTE — Discharge Instructions (Signed)
You can temporarily decrease her insulin to 8 units twice a day.  Continue to take your oral diabetes medications.  Schedule follow-up with your primary doctor for next week for a recheck.

## 2018-07-28 NOTE — ED Provider Notes (Signed)
[place] EMERGENCY DEPARTMENT Provider Note   CSN: [id number] Arrival date & time: 07/28/18  0020     History   Chief Complaint Chief Complaint  Patient presents with  . diabetes education    HPI Allison Franco is a 77 y.o. female.  Patient comes to the ER because of concern over low blood sugar.  Patient does have a history of diabetes.  She is currently on metformin and Levemir.  She reports that she could not sleep tonight so she checked her blood sugar.  It was 90 at home and this concerned her.  She thought this was too low when she became very anxious.  She reports that before she was placed on the insulin, her sugars were 600.  She was on 15 units twice a day and this was recently decreased to 10 units twice a day.     Past Medical History:  Diagnosis Date  . Anxiety   . Constipation   . Depression    "following husband's death"  . Diabetes mellitus without complication ([place])   . Hypertension   . Tinnitus     Patient Active Problem List   Diagnosis Date Noted  . Other headache syndrome 07/03/2018  . Essential hypertension 07/03/2018  . Uncontrolled diabetes mellitus type 2 without complications ([place]) 84/16/6063  . Grief reaction 10/20/2013  . Hyponatremia 10/20/2013  . Obesity 10/20/2013  . Atypical chest pain 09/01/2013  . ALLERGIC RHINITIS 08/11/2009  . KNEE PAIN, BILATERAL 02/13/2009  . COUGH 02/13/2009  . INSOMNIA 07/17/2008  . TIREDNESS 04/16/2008  . HYPERTENSION 01/24/2008  . BACK PAIN, LUMBAR 01/22/2008  . HYPERGLYCEMIA, BORDERLINE 01/27/2007    Past Surgical History:  Procedure Laterality Date  . ABDOMINAL HYSTERECTOMY    . fribroid surgery       OB History   None      Home Medications    Prior to Admission medications   Medication Sig Start Date End Date Taking? Authorizing Provider  acetaminophen (TYLENOL) 325 MG tablet Take 325-650 mg by mouth every 6 (six) hours as needed for mild pain, moderate pain or  headache. Reported on 05/10/2016    [provider]  ALPRAZolam Duanne Moron) 0.25 MG tablet Take 1 tablet (0.25 mg total) by mouth 3 (three) times daily as needed for anxiety. 07/12/18   Julianne Rice, MD  blood glucose meter kit and supplies KIT Dispense based on patient and insurance preference. Use up to four times daily as directed. (FOR ICD-9 250.00, 250.01). 07/05/18   Florencia Reasons, MD  calcium carbonate (OSCAL) 1500 (600 Ca) MG TABS tablet Take by mouth 2 (two) times daily with a meal.    [provider]  cholecalciferol (VITAMIN D) 1000 units tablet Take 2,000 Units by mouth daily.    [provider]  clobetasol ointment (TEMOVATE) 0.16 % Apply 1 application topically 2 (two) times daily. Patient taking differently: Apply 1 application topically 2 (two) times daily as needed (dermatosis).  06/14/18   Raylene Everts, MD  diphenhydrAMINE (BENADRYL) 25 mg capsule Take 1 capsule (25 mg total) by mouth at bedtime as needed for allergies. 12/01/17   Tasia Catchings, Amy V, PA-C  EPINEPHrine (EPIPEN 2-PAK) 0.3 mg/0.3 mL IJ SOAJ injection Inject 0.3 mLs (0.3 mg total) into the muscle once as needed (for severe allergic reaction). CAll 911 immediately if you have to use this medicine 09/10/17   Larene Pickett, PA-C  fluticasone [facility]) 50 MCG/ACT nasal spray Place 2 sprays into both nostrils daily.  Patient taking differently: Place 2 sprays into both nostrils daily as needed for allergies.  01/05/18   Tasia Catchings, Amy V, PA-C  insulin detemir (LEVEMIR) 100 UNIT/ML injection Inject 0.1 mLs (10 Units total) into the skin 2 (two) times daily. 07/05/18   Florencia Reasons, MD  meclizine (ANTIVERT) 25 MG tablet Take 1 tablet (25 mg total) by mouth 3 (three) times daily as needed for dizziness. 9/60/45   Delora Fuel, MD  metFORMIN (GLUCOPHAGE) 500 MG tablet Take 1 tablet (500 mg total) by mouth 2 (two) times daily with a meal. 07/05/18 07/05/19  Florencia Reasons, MD  Multiple Vitamin (MULTIVITAMIN WITH MINERALS) TABS tablet Take 1  tablet by mouth daily.    [provider]  olmesartan (BENICAR) 20 MG tablet Take 1 tablet (20 mg total) by mouth daily. 07/11/18   Domenic Moras, PA-C  pantoprazole (PROTONIX) 40 MG tablet Take 1 tablet (40 mg total) by mouth daily. 03/06/80   Delora Fuel, MD  simvastatin (ZOCOR) 10 MG tablet Take 10 mg by mouth daily.    [provider]  zolpidem (AMBIEN) 5 MG tablet Take 1 tablet (5 mg total) by mouth at bedtime as needed for sleep. 1/91/47   Delora Fuel, MD    Family History Family History  Problem Relation Age of Onset  . Diabetes Mother   . Hypertension Father     Social History Social History   Tobacco Use  . Smoking status: Never Smoker  . Smokeless tobacco: Never Used  Substance Use Topics  . Alcohol use: No  . Drug use: No     Allergies   Aspirin; Hydroxyzine; and Valium [diazepam]   Review of Systems Review of Systems  Psychiatric/Behavioral: The patient is nervous/anxious.   All other systems reviewed and are negative.    Physical Exam Updated Vital Signs BP 139/74 (BP Location: Right Arm)   Pulse 95   Temp 97.8 F (36.6 C) (Oral)   Resp 18   SpO2 99%   Physical Exam  Constitutional: She is oriented to person, place, and time. She appears well-developed and well-nourished. No distress.  HENT:  Head: Normocephalic and atraumatic.  Right Ear: Hearing normal.  Left Ear: Hearing normal.  Nose: Nose normal.  Mouth/Throat: Oropharynx is clear and moist and mucous membranes are normal.  Eyes: Pupils are equal, round, and reactive to light. Conjunctivae and EOM are normal.  Neck: Normal range of motion. Neck supple.  Cardiovascular: Regular rhythm, S1 normal and S2 normal. Exam reveals no gallop and no friction rub.  No murmur heard. Pulmonary/Chest: Effort normal and breath sounds normal. No respiratory distress. She exhibits no tenderness.  Abdominal: Soft. Normal appearance and bowel sounds are normal. There is no hepatosplenomegaly.  There is no tenderness. There is no rebound, no guarding, no tenderness at McBurney's point and negative Murphy's sign. No hernia.  Musculoskeletal: Normal range of motion.  Neurological: She is alert and oriented to person, place, and time. She has normal strength. No cranial nerve deficit or sensory deficit. Coordination normal. GCS eye subscore is 4. GCS verbal subscore is 5. GCS motor subscore is 6.  Skin: Skin is warm, dry and intact. No rash noted. No cyanosis.  Psychiatric: She has a normal mood and affect. Her speech is normal and behavior is normal. Thought content normal.  Nursing note and vitals reviewed.    ED Treatments / Results  Labs (all labs ordered are listed, but only abnormal results are displayed) Labs Reviewed  CBG MONITORING, ED - Abnormal;  Notable for the following components:      Result Value   Glucose-Capillary 101 (*)    All other components within normal limits  CBG MONITORING, ED - Abnormal; Notable for the following components:   Glucose-Capillary 109 (*)    All other components within normal limits    EKG None  Radiology No results found.  Procedures Procedures (including critical care time)  Medications Ordered in ED Medications - No data to display   Initial Impression / Assessment and Plan / ED Course  I have reviewed the triage vital signs and the nursing notes.  Pertinent labs & imaging results that were available during my care of the patient were reviewed by me and considered in my medical decision making (see chart for details).     Patient reassured.  Her blood sugar is 100 followed by 109 here in the ER.  She was also informed that 90 is not too low.  She seems very anxious about the low blood sugars.  It sounds as if she has possibly been noncompliant in the past and had very uncontrolled diabetes prior to initiating the insulin.  She was told that she can cut back the insulin dosing to 8 units twice a day and follow-up with her  primary doctor as soon as possible.  She should monitor her sugars closely, however.  Final Clinical Impressions(s) / ED Diagnoses   Final diagnoses:  Insulin dependent diabetes mellitus [facility])    ED Discharge Orders    None       Orpah Greek, MD 07/28/18 [phone]

## 2018-07-30 DIAGNOSIS — F419 Anxiety disorder, unspecified: Secondary | ICD-10-CM | POA: Diagnosis not present

## 2018-07-30 DIAGNOSIS — I10 Essential (primary) hypertension: Secondary | ICD-10-CM | POA: Diagnosis not present

## 2018-07-30 DIAGNOSIS — G4489 Other headache syndrome: Secondary | ICD-10-CM | POA: Diagnosis not present

## 2018-07-30 DIAGNOSIS — F329 Major depressive disorder, single episode, unspecified: Secondary | ICD-10-CM | POA: Diagnosis not present

## 2018-07-30 DIAGNOSIS — G4700 Insomnia, unspecified: Secondary | ICD-10-CM | POA: Diagnosis not present

## 2018-07-30 DIAGNOSIS — E1165 Type 2 diabetes mellitus with hyperglycemia: Secondary | ICD-10-CM | POA: Diagnosis not present

## 2018-08-02 DIAGNOSIS — E1165 Type 2 diabetes mellitus with hyperglycemia: Secondary | ICD-10-CM | POA: Diagnosis not present

## 2018-08-02 DIAGNOSIS — Z1331 Encounter for screening for depression: Secondary | ICD-10-CM | POA: Diagnosis not present

## 2018-08-02 DIAGNOSIS — H401122 Primary open-angle glaucoma, left eye, moderate stage: Secondary | ICD-10-CM | POA: Diagnosis not present

## 2018-08-02 DIAGNOSIS — I10 Essential (primary) hypertension: Secondary | ICD-10-CM | POA: Diagnosis not present

## 2018-08-03 ENCOUNTER — Ambulatory Visit: Payer: Self-pay | Admitting: Dietician

## 2018-08-07 DIAGNOSIS — Z1212 Encounter for screening for malignant neoplasm of rectum: Secondary | ICD-10-CM | POA: Diagnosis not present

## 2018-08-07 DIAGNOSIS — E1165 Type 2 diabetes mellitus with hyperglycemia: Secondary | ICD-10-CM | POA: Diagnosis not present

## 2018-08-07 DIAGNOSIS — D649 Anemia, unspecified: Secondary | ICD-10-CM | POA: Diagnosis not present

## 2018-08-10 ENCOUNTER — Encounter: Payer: Medicare Other | Attending: Nurse Practitioner | Admitting: Dietician

## 2018-08-10 ENCOUNTER — Encounter: Payer: Self-pay | Admitting: Dietician

## 2018-08-10 DIAGNOSIS — Z713 Dietary counseling and surveillance: Secondary | ICD-10-CM | POA: Diagnosis not present

## 2018-08-10 DIAGNOSIS — IMO0001 Reserved for inherently not codable concepts without codable children: Secondary | ICD-10-CM

## 2018-08-10 DIAGNOSIS — Z6830 Body mass index (BMI) 30.0-30.9, adult: Secondary | ICD-10-CM | POA: Insufficient documentation

## 2018-08-10 DIAGNOSIS — E1165 Type 2 diabetes mellitus with hyperglycemia: Secondary | ICD-10-CM | POA: Insufficient documentation

## 2018-08-10 NOTE — Patient Instructions (Addendum)
Scan your blood sugar every 8 hours or more often. Great job on the changes that you have made! Continue to be active.  Walk every day. You may have 2-3 carbohydrate choices at each meal.  See you list.  These could include sweet potato, brown rice, or beans.

## 2018-08-10 NOTE — Progress Notes (Signed)
Diabetes Self-Management Education  Visit Type: Follow-up  Appt. Start Time: 1415 Appt. End Time: 5643  08/10/2018  [name], identified by name and date of birth, is a 77 y.o. female with a diagnosis of Diabetes: Type 2.  She was last seen by myself in April.  Since that time, she went to the ER with "low blood sugar" of 90 and then went to her primary doctor who discontinued her insulin.  Her blood sugar before breakfast is 104-140 and after dinner 113-136.  She is wearing a [employer].  She is following a low carb/glycemic diet well but complains of being very hungry. Medications include Metformin.  She reports that the Levemir has been stopped by her regular MD. Patient states that she is allergic to uncooked sugar and avoids this.  She has stopped eating fufu, rice, bread, potatoes, and other sweets.  Patient is here today alone.  She takes the bus and walks wherever she goes.  Her husband died in 03/16/2013.  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  She moved from [place] in Mar 16, 2006.  She does not require an  Veterinary surgeon and is very interested in learning but some language barrier and cannot read fine print.  She will require increased education.  ASSESSMENT  Weight 174 lb (78.9 kg). Body mass index is 30.82 kg/m.  Diabetes Self-Management Education - 08/10/18 1521      Visit Information   Visit Type  Follow-up      Initial Visit   Diabetes Type  Type 2    Are you taking your medications as prescribed?  Yes      Psychosocial Assessment   Patient Belief/Attitude about Diabetes  Motivated to manage diabetes    Self-care barriers  Impaired vision;English as a second language    Self-management support  Doctor's office;Friends    Other persons present  Patient    Patient Concerns  Nutrition/Meal planning;Weight Control    Special Needs  Large print    Preferred Learning Style  No preference indicated    Learning Readiness  Ready    How often do you need  to have someone help you when you read instructions, pamphlets, or other written materials from your doctor or pharmacy?  3 - Sometimes    What is the last grade level you completed in school?  4 years college      Pre-Education Assessment   Patient understands the diabetes disease and treatment process.  Demonstrates understanding / competency    Patient understands incorporating nutritional management into lifestyle.  Needs Review    Patient undertands incorporating physical activity into lifestyle.  Demonstrates understanding / competency    Patient understands using medications safely.  Demonstrates understanding / competency    Patient understands monitoring blood glucose, interpreting and using results  Demonstrates understanding / competency    Patient understands prevention, detection, and treatment of acute complications.  Demonstrates understanding / competency    Patient understands prevention, detection, and treatment of chronic complications.  Demonstrates understanding / competency    Patient understands how to develop strategies to address psychosocial issues.  Demonstrates understanding / competency    Patient understands how to develop strategies to promote health/change behavior.  Needs Review      Complications   How often do you check your blood sugar?  3-4 times/day    Fasting Blood glucose range (mg/dL)  70-129;130-179    Postprandial Blood glucose range (mg/dL)  70-129;130-179    Number of  hypoglycemic episodes per month  0    Number of hyperglycemic episodes per week  0      Dietary Intake   Breakfast  2 eggs, strawberries, almond milk, [place] slices    Snack (morning)  mixed nuts    Lunch  [place] patty, salad, strawberries, almond milk    Snack (afternoon)  mixed nuts    Dinner  salmon, broccoli, almond milk    Beverage(s)  water, almond milk      Exercise   Exercise Type  Light (walking / raking leaves)    How many days per week to you exercise?  7    How  many minutes per day do you exercise?  60    Total minutes per week of exercise  420      Patient Education   Previous Diabetes Education  Yes (please comment)   1 month ago   Nutrition management   Meal options for control of blood glucose level and chronic complications.;Food label reading, portion sizes and measuring food.;Role of diet in the treatment of diabetes and the relationship between the three main macronutrients and blood glucose level    Monitoring  Purpose and frequency of SMBG.;Identified appropriate SMBG and/or A1C goals.;Taught/evaluated SMBG meter.    Acute complications  Taught treatment of hypoglycemia - the 15 rule.    Psychosocial adjustment  Worked with patient to identify barriers to care and solutions      Individualized Goals (developed by patient)   Nutrition  General guidelines for healthy choices and portions discussed    Physical Activity  Exercise 5-7 days per week;60 minutes per day    Medications  take my medication as prescribed    Monitoring   test my blood glucose as discussed    Reducing Risk  examine blood glucose patterns    Health Coping  discuss diabetes with (comment)   MD, RD, CDE     Patient Self-Evaluation of Goals - Patient rates self as meeting previously set goals (% of time)   Nutrition  >75%    Physical Activity  >75%    Medications  >75%    Monitoring  >75%    Problem Solving  >75%    Reducing Risk  >75%    Health Coping  >75%      Post-Education Assessment   Patient understands the diabetes disease and treatment process.  Demonstrates understanding / competency    Patient understands incorporating nutritional management into lifestyle.  Needs Review    Patient undertands incorporating physical activity into lifestyle.  Demonstrates understanding / competency    Patient understands using medications safely.  Demonstrates understanding / competency    Patient understands monitoring blood glucose, interpreting and using results   Demonstrates understanding / competency    Patient understands prevention, detection, and treatment of acute complications.  Demonstrates understanding / competency    Patient understands prevention, detection, and treatment of chronic complications.  Demonstrates understanding / competency    Patient understands how to develop strategies to address psychosocial issues.  Demonstrates understanding / competency    Patient understands how to develop strategies to promote health/change behavior.  Demonstrates understanding / competency      Outcomes   Expected Outcomes  Demonstrated interest in learning. Expect positive outcomes    Future DMSE  4-6 wks    Program Status  Completed      Subsequent Visit   Since your last visit have you continued or begun to take your medications as prescribed?  Yes    Since your last visit have you had your blood pressure checked?  Yes    Since your last visit have you experienced any weight changes?  No change    Since your last visit, are you checking your blood glucose at least once a day?  Yes       Individualized Plan for Diabetes Self-Management Training:   Learning Objective:  Patient will have a greater understanding of diabetes self-management. Patient education plan is to attend individual and/or group sessions per assessed needs and concerns.   Plan:   Patient Instructions  Scan your blood sugar every 8 hours or more often. Great job on the changes that you have made! Continue to be active.  Walk every day. You may have 2-3 carbohydrate choices at each meal.  See you list.  These could include sweet potato, brown rice, or beans.    Expected Outcomes:  Demonstrated interest in learning. Expect positive outcomes  Education material provided: Meal plan card  If problems or questions, patient to contact team via:  Phone  Future DSME appointment: 4-6 wks

## 2018-08-11 DIAGNOSIS — R51 Headache: Secondary | ICD-10-CM | POA: Diagnosis not present

## 2018-08-11 DIAGNOSIS — H9311 Tinnitus, right ear: Secondary | ICD-10-CM | POA: Diagnosis not present

## 2018-08-12 ENCOUNTER — Emergency Department (HOSPITAL_COMMUNITY): Payer: Medicare Other

## 2018-08-12 ENCOUNTER — Emergency Department (HOSPITAL_COMMUNITY)
Admission: EM | Admit: 2018-08-12 | Discharge: 2018-08-12 | Disposition: A | Discharge status: Home / Self Care | Payer: Medicare Other | Attending: Emergency Medicine | Admitting: Emergency Medicine

## 2018-08-12 ENCOUNTER — Encounter (HOSPITAL_COMMUNITY): Payer: Self-pay | Admitting: Emergency Medicine

## 2018-08-12 DIAGNOSIS — Z79899 Other long term (current) drug therapy: Secondary | ICD-10-CM | POA: Diagnosis not present

## 2018-08-12 DIAGNOSIS — I10 Essential (primary) hypertension: Secondary | ICD-10-CM | POA: Insufficient documentation

## 2018-08-12 DIAGNOSIS — Z794 Long term (current) use of insulin: Secondary | ICD-10-CM | POA: Diagnosis not present

## 2018-08-12 DIAGNOSIS — E119 Type 2 diabetes mellitus without complications: Secondary | ICD-10-CM | POA: Insufficient documentation

## 2018-08-12 DIAGNOSIS — R51 Headache: Secondary | ICD-10-CM | POA: Insufficient documentation

## 2018-08-12 DIAGNOSIS — H9311 Tinnitus, right ear: Secondary | ICD-10-CM | POA: Diagnosis not present

## 2018-08-12 DIAGNOSIS — R519 Headache, unspecified: Secondary | ICD-10-CM

## 2018-08-12 DIAGNOSIS — H93A9 Pulsatile tinnitus, unspecified ear: Secondary | ICD-10-CM | POA: Diagnosis not present

## 2018-08-12 LAB — COMPREHENSIVE METABOLIC PANEL
ALT: 24 U/L (ref 0–44)
AST: 24 U/L (ref 15–41)
Albumin: 3.5 g/dL (ref 3.5–5.0)
Alkaline Phosphatase: 74 U/L (ref 38–126)
Anion gap: 11 (ref 5–15)
BILIRUBIN TOTAL: 0.8 mg/dL (ref 0.3–1.2)
BUN: 12 mg/dL (ref 8–23)
CO2: 23 mmol/L (ref 22–32)
CREATININE: 0.72 mg/dL (ref 0.44–1.00)
Calcium: 9.1 mg/dL (ref 8.9–10.3)
Chloride: 105 mmol/L (ref 98–111)
Glucose, Bld: 120 mg/dL — ABNORMAL HIGH (ref 70–99)
POTASSIUM: 4 mmol/L (ref 3.5–5.1)
Sodium: 139 mmol/L (ref 135–145)
TOTAL PROTEIN: 6.4 g/dL — AB (ref 6.5–8.1)

## 2018-08-12 LAB — CBC WITH DIFFERENTIAL/PLATELET
Abs Immature Granulocytes: 0 10*3/uL (ref 0.0–0.1)
BASOS PCT: 1 %
Basophils Absolute: 0 10*3/uL (ref 0.0–0.1)
EOS ABS: 0.2 10*3/uL (ref 0.0–0.7)
Eosinophils Relative: 3 %
HEMATOCRIT: 39.3 % (ref 36.0–46.0)
Hemoglobin: 12 g/dL (ref 12.0–15.0)
IMMATURE GRANULOCYTES: 0 %
LYMPHS ABS: 1.6 10*3/uL (ref 0.7–4.0)
Lymphocytes Relative: 26 %
MCH: 29.5 pg (ref 26.0–34.0)
MCHC: 30.5 g/dL (ref 30.0–36.0)
MCV: 96.6 fL (ref 78.0–100.0)
MONO ABS: 0.5 10*3/uL (ref 0.1–1.0)
Monocytes Relative: 8 %
NEUTROS PCT: 62 %
Neutro Abs: 4 10*3/uL (ref 1.7–7.7)
PLATELETS: 240 10*3/uL (ref 150–400)
RBC: 4.07 MIL/uL (ref 3.87–5.11)
RDW: 13.7 % (ref 11.5–15.5)
WBC: 6.3 10*3/uL (ref 4.0–10.5)

## 2018-08-12 LAB — CBG MONITORING, ED: GLUCOSE-CAPILLARY: 128 mg/dL — AB (ref 70–99)

## 2018-08-12 LAB — SEDIMENTATION RATE: SED RATE: 12 mm/h (ref 0–22)

## 2018-08-12 LAB — TROPONIN I

## 2018-08-12 MED ORDER — ALPRAZOLAM 0.25 MG PO TABS
0.5000 mg | ORAL_TABLET | Freq: Once | ORAL | Status: AC
  Administered 2018-08-12: 0.5 mg via ORAL
  Filled 2018-08-12: qty 2

## 2018-08-12 MED ORDER — GADOBUTROL 1 MMOL/ML IV SOLN
7.5000 mL | Freq: Once | INTRAVENOUS | Status: AC | PRN
  Administered 2018-08-12: 7.5 mL via INTRAVENOUS

## 2018-08-12 NOTE — ED Triage Notes (Signed)
Reports having a headache for last three days and unable to sleep.  Reports hearing "wind" when trying to sleep.  Not taking anything for headache.

## 2018-08-12 NOTE — ED Triage Notes (Signed)
Pt has not returned from MRI

## 2018-08-12 NOTE — ED Triage Notes (Signed)
Pt is still in MRI Dept

## 2018-08-12 NOTE — ED Notes (Signed)
Declined W/C at D/C and was escorted to lobby by RN. 

## 2018-08-12 NOTE — ED Triage Notes (Signed)
PT to MRI scanner

## 2018-08-12 NOTE — ED Provider Notes (Signed)
7:11 AM BP (!) 147/83   Pulse 74   Temp 97.9 F (36.6 C) (Oral)   Resp (!) 24   Ht 5\' 3"  (1.6 m)   Wt 78.9 kg   SpO2 97%   BMI 30.81 kg/m  Patient taken in sign out from Dr. Wyvonnia Dusky Here with Tinnitus and clinically benign headache. Hx of intermittent tinnitus. Tinnitus is worse and more prolonged. Awaiting MRI. Expect D/C.   Patient MRI negative for any acute abnormalities.  She will be discharged to final low up with ENT.   Margarita Mail, PA-C 08/12/18 1713    Ezequiel Essex, MD 08/13/18 [phone]

## 2018-08-12 NOTE — Discharge Instructions (Addendum)
Your MRI was negative. Follow up with your primary care doctor Your tinnitus continues for 3 weeks or longer without stopping. Home care measures are not helping. 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 You have tinnitus along with any of the following: Dizziness. Loss of balance. Nausea and vomiting.

## 2018-08-12 NOTE — ED Provider Notes (Signed)
[facility] EM[facility]GENCY DEPART[facility] Provider Note   CSN: [id number] Arrival date & time: 08/12/18  0000     History   Chief Complaint Chief Complaint  Patient presents with  . Headache  . Insomnia    HPI Allison Franco is a 77 y.o. female.  Patient with history of diabetes presenting with a 5-day history of frontal headache that is gradual in onset.  She denies thunderclap onset.  This is associated with ringing in her right ear.  She reports a sensation of "wind in my ear".  She is taken Tylenol at home for the headache without relief.  Denies any focal weakness, numbness or tingling.  Denies any difficulty speaking or difficulty swallowing.  She has not been able to sleep because of the headache.  No chest pain or shortness of breath.  Denies any visual change.  No blurry vision or double vision. Record review shows she is had an evaluation for tinitus in the past by neurology as well as MRIs and CT scans that have been reassuring.  She reports this is the same as what she is had in the past but more persistent. Denies any photophobia or phonophobia.  No fever.  No chest pain or shortness of breath.  The history is provided by the patient.  Headache   Pertinent negatives include no fever, no shortness of breath, no nausea and no vomiting.  Insomnia  Associated symptoms include headaches. Pertinent negatives include no chest pain and no shortness of breath.    Past Medical History:  Diagnosis Date  . Anxiety   . Constipation   . Depression    "following husband's death"  . Diabetes mellitus without complication ([place])   . Hypertension   . Tinnitus     Patient Active Problem List   Diagnosis Date Noted  . Other headache syndrome 07/03/2018  . Essential hypertension 07/03/2018  . Uncontrolled diabetes mellitus type 2 without complications ([place]) 16/11/930  . Grief reaction 10/20/2013  . Hyponatremia 10/20/2013  . Obesity 10/20/2013  . Atypical chest pain  09/01/2013  . ALLERGIC RHINITIS 08/11/2009  . KNEE PAIN, BILATERAL 02/13/2009  . COUGH 02/13/2009  . INSOMNIA 07/17/2008  . TIREDNESS 04/16/2008  . HYPERTENSION 01/24/2008  . BACK PAIN, LUMBAR 01/22/2008  . HYPERGLYCEMIA, BORDERLINE 01/27/2007    Past Surgical History:  Procedure Laterality Date  . ABDOMINAL HYSTERECTOMY    . fribroid surgery       OB History   None      Home Medications    Prior to Admission medications   Medication Sig Start Date End Date Taking? Authorizing Provider  acetaminophen (TYLENOL) 325 MG tablet Take 325-650 mg by mouth every 6 (six) hours as needed for mild pain, moderate pain or headache. Reported on 05/10/2016    [provider]  ALPRAZolam Duanne Moron) 0.25 MG tablet Take 1 tablet (0.25 mg total) by mouth 3 (three) times daily as needed for anxiety. 07/12/18   Julianne Rice, MD  blood glucose meter kit and supplies KIT Dispense based on patient and insurance preference. Use up to four times daily as directed. (FOR ICD-9 250.00, 250.01). 07/05/18   Florencia Reasons, MD  calcium carbonate (OSCAL) 1500 (600 Ca) MG TABS tablet Take by mouth 2 (two) times daily with a meal.    [provider]  cholecalciferol (VITAMIN D) 1000 units tablet Take 2,000 Units by mouth daily.    [provider]  clobetasol ointment (TEMOVATE) 3.55 % Apply 1 application topically 2 (two)  times daily. Patient taking differently: Apply 1 application topically 2 (two) times daily as needed (dermatosis).  06/14/18   Raylene Everts, MD  diphenhydrAMINE (BENADRYL) 25 mg capsule Take 1 capsule (25 mg total) by mouth at bedtime as needed for allergies. 12/01/17   Tasia Catchings, Amy V, PA-C  EPINEPHrine (EPIPEN 2-PAK) 0.3 mg/0.3 mL IJ SOAJ injection Inject 0.3 mLs (0.3 mg total) into the muscle once as needed (for severe allergic reaction). CAll 911 immediately if you have to use this medicine 09/10/17   Larene Pickett, PA-C  fluticasone [facility]) 50 MCG/ACT nasal spray Place 2  sprays into both nostrils daily. Patient taking differently: Place 2 sprays into both nostrils daily as needed for allergies.  01/05/18   Tasia Catchings, Amy V, PA-C  insulin detemir (LEVEMIR) 100 UNIT/ML injection Inject 0.1 mLs (10 Units total) into the skin 2 (two) times daily. 07/05/18   Florencia Reasons, MD  meclizine (ANTIVERT) 25 MG tablet Take 1 tablet (25 mg total) by mouth 3 (three) times daily as needed for dizziness. 3/41/96   Delora Fuel, MD  metFORMIN (GLUCOPHAGE) 500 MG tablet Take 1 tablet (500 mg total) by mouth 2 (two) times daily with a meal. 07/05/18 07/05/19  Florencia Reasons, MD  Multiple Vitamin (MULTIVITAMIN WITH MINERALS) TABS tablet Take 1 tablet by mouth daily.    [provider]  olmesartan (BENICAR) 20 MG tablet Take 1 tablet (20 mg total) by mouth daily. 07/11/18   Domenic Moras, PA-C  pantoprazole (PROTONIX) 40 MG tablet Take 1 tablet (40 mg total) by mouth daily. 01/20/96   Delora Fuel, MD  simvastatin (ZOCOR) 10 MG tablet Take 10 mg by mouth daily.    [provider]  zolpidem (AMBIEN) 5 MG tablet Take 1 tablet (5 mg total) by mouth at bedtime as needed for sleep. 9/89/21   Delora Fuel, MD    Family History Family History  Problem Relation Age of Onset  . Diabetes Mother   . Hypertension Father     Social History Social History   Tobacco Use  . Smoking status: Never Smoker  . Smokeless tobacco: Never Used  Substance Use Topics  . Alcohol use: No  . Drug use: No     Allergies   Aspirin; Hydroxyzine; and Valium [diazepam]   Review of Systems Review of Systems  Constitutional: Negative for activity change, appetite change and fever.  HENT: Positive for tinnitus. Negative for dental problem.   Eyes: Negative for photophobia and visual disturbance.  Respiratory: Negative for chest tightness and shortness of breath.   Cardiovascular: Negative for chest pain.  Gastrointestinal: Negative for nausea and vomiting.  Genitourinary: Negative for dysuria.  Musculoskeletal:  Negative for arthralgias and myalgias.  Skin: Negative for rash.  Neurological: Positive for headaches. Negative for dizziness, weakness and numbness.  Psychiatric/Behavioral: Positive for sleep disturbance. The patient has insomnia. The patient is not nervous/anxious.     all other systems are negative except as noted in the HPI and PMH.    Physical Exam Updated Vital Signs BP 127/73   Pulse (!) 59   Temp 97.9 F (36.6 C) (Oral)   Resp 19   Ht '5\' 3"'  (1.6 m)   Wt 78.9 kg   SpO2 100%   BMI 30.81 kg/m   Physical Exam  Constitutional: She is oriented to person, place, and time. She appears well-developed and well-nourished. No distress.  HENT:  Head: Normocephalic and atraumatic.  Mouth/Throat: Oropharynx is clear and moist. No oropharyngeal exudate.  No temporal  artery tenderness, visual fields full to confrontation  Eyes: Pupils are equal, round, and reactive to light. Conjunctivae and EOM are normal. Left eye exhibits no discharge.  Neck: Normal range of motion. Neck supple.  No meningismus.  Cardiovascular: Normal rate, regular rhythm, normal heart sounds and intact distal pulses.  No murmur heard. Pulmonary/Chest: Effort normal and breath sounds normal. No respiratory distress. She exhibits no tenderness.  Abdominal: Soft. There is no tenderness. There is no rebound and no guarding.  Musculoskeletal: Normal range of motion. She exhibits no edema or tenderness.  Neurological: She is alert and oriented to person, place, and time. No cranial nerve deficit. She exhibits normal muscle tone. Coordination normal.  CN 2-12 intact, no ataxia on finger to nose, no nystagmus, 5/5 strength throughout, no pronator drift, Romberg negative, normal gait.   Skin: Skin is warm. Capillary refill takes less than 2 seconds. No rash noted.  Psychiatric: She has a normal mood and affect. Her behavior is normal.  Nursing note and vitals reviewed.    ED Treatments / Results  Labs (all labs  ordered are listed, but only abnormal results are displayed) Labs Reviewed  COMPREHENSIVE METABOLIC PANEL - Abnormal; Notable for the following components:      Result Value   Glucose, Bld 120 (*)    Total Protein 6.4 (*)    All other components within normal limits  CBG MONITORING, ED - Abnormal; Notable for the following components:   Glucose-Capillary 128 (*)    All other components within normal limits  CBC WITH DIFFERENTIAL/PLATELET  TROPONIN I  SEDIMENTATION RATE    EKG EKG Interpretation  Date/Time:  Sunday August 12 2018 02:32:17 EDT Ventricular Rate:  60 PR Interval:    QRS Duration: 93 QT Interval:  412 QTC Calculation: 412 R Axis:   68 Text Interpretation:  Sinus rhythm Probable left atrial enlargement Low voltage, extremity leads Consider anterior infarct Minimal ST elevation, inferior leads Baseline wander in lead(s) II III aVF No significant change was found Confirmed by Ezequiel Essex [phone]) on 08/12/2018 3:37:02 AM   Radiology No results found.  Procedures Procedures (including critical care time)  Medications Ordered in ED Medications  ALPRAZolam (XANAX) tablet 0.5 mg (has no administration in time range)     Initial Impression / Assessment and Plan / ED Course  I have reviewed the triage vital signs and the nursing notes.  Pertinent labs & imaging results that were available during my care of the patient were reviewed by me and considered in my medical decision making (see chart for details).    4 days of gradual onset headache with tinnitus and "wind" sensation in R ear.  TMs normal.   Neuro intact. No ataxia on finger to nose. Negative romberg.  Low suspicion for CVA, meningitis, SAH, temporal arteritis.  ESR normal and no visual changes.   CT and MRI of brain in past have not shown etiology for tinnitus.   Patient states tinnitus is normally intermittent but has persistent and not going away for the past 4 days. She will be treated  symptomatically and repeat MRI will be obtained.  This is pending at shift change. PA-C HArris to assume care.  Final Clinical Impressions(s) / ED Diagnoses   Final diagnoses:  Tinnitus of right ear  Moderate headache    ED Discharge Orders    None       , Annie Main, MD 08/12/18 [phone]

## 2018-08-31 ENCOUNTER — Encounter: Payer: Self-pay | Admitting: Nurse Practitioner

## 2018-08-31 DIAGNOSIS — D649 Anemia, unspecified: Secondary | ICD-10-CM

## 2018-08-31 DIAGNOSIS — E1165 Type 2 diabetes mellitus with hyperglycemia: Secondary | ICD-10-CM

## 2018-09-14 ENCOUNTER — Ambulatory Visit: Payer: Medicare Other | Admitting: Dietician

## 2018-09-22 DIAGNOSIS — Z23 Encounter for immunization: Secondary | ICD-10-CM | POA: Diagnosis not present

## 2018-10-04 ENCOUNTER — Encounter: Payer: Self-pay | Admitting: Internal Medicine

## 2018-10-04 ENCOUNTER — Ambulatory Visit (INDEPENDENT_AMBULATORY_CARE_PROVIDER_SITE_OTHER): Payer: Medicare Other

## 2018-10-04 ENCOUNTER — Ambulatory Visit (INDEPENDENT_AMBULATORY_CARE_PROVIDER_SITE_OTHER): Payer: Medicare Other | Admitting: Internal Medicine

## 2018-10-04 VITALS — BP 126/88 | HR 85 | Temp 97.5°F | Ht 62.0 in | Wt 175.0 lb

## 2018-10-04 VITALS — BP 126/68 | HR 85 | Temp 97.5°F | Ht 61.5 in | Wt 175.0 lb

## 2018-10-04 DIAGNOSIS — I10 Essential (primary) hypertension: Secondary | ICD-10-CM | POA: Diagnosis not present

## 2018-10-04 DIAGNOSIS — Z794 Long term (current) use of insulin: Secondary | ICD-10-CM

## 2018-10-04 DIAGNOSIS — E1165 Type 2 diabetes mellitus with hyperglycemia: Secondary | ICD-10-CM | POA: Diagnosis not present

## 2018-10-04 DIAGNOSIS — R7309 Other abnormal glucose: Secondary | ICD-10-CM | POA: Diagnosis not present

## 2018-10-04 DIAGNOSIS — Z0000 Encounter for general adult medical examination without abnormal findings: Secondary | ICD-10-CM

## 2018-10-04 DIAGNOSIS — Z23 Encounter for immunization: Secondary | ICD-10-CM | POA: Diagnosis not present

## 2018-10-04 NOTE — Progress Notes (Signed)
Subjective:   Dorothy Polhemus is a 77 y.o. female who presents for Medicare Annual (Subsequent) preventive examination.  Review of Systems:  n/a Cardiac Risk Factors include: advanced age (>22mn, >>41women);diabetes mellitus;hypertension     Objective:     Vitals: BP 126/88 (BP Location: Left Arm)   Pulse 85   Temp (!) 97.5 F (36.4 C)   Ht '5\' 2"'  (1.575 m)   Wt 175 lb (79.4 kg)   BMI 32.01 kg/m   Body mass index is 32.01 kg/m.  Advanced Directives 10/04/2018 08/12/2018 07/28/2018 07/20/2018 07/18/2018 07/04/2018 07/02/2018  Does Patient Have a Medical Advance Directive? No No No No No No No  Type of Advance Directive - - - - - - -  Does patient want to make changes to medical advance directive? - - - - - - -  Copy of [place] Chart? - - - - - - -  Would patient like information on creating a medical advance directive? Yes (MAU/Ambulatory/Procedural Areas - Information given) No - Patient declined No - Patient declined No - Patient declined No - Patient declined - No - Patient declined  Pre-existing out of facility DNR order (yellow form or pink MOST form) - - - - - - -    Tobacco Social History   Tobacco Use  Smoking Status Never Smoker  Smokeless Tobacco Never Used     Counseling given: Not Answered   Clinical Intake:  Pre-visit preparation completed: Yes  Pain : No/denies pain Pain Score: 0-No pain     Nutritional Status: BMI > 30  Obese Nutritional Risks: None Diabetes: Yes CBG done?: No Did pt. bring in CBG monitor from home?: No  How often do you need to have someone help you when you read instructions, pamphlets, or other written materials from your doctor or pharmacy?: 1 - Never What is the last grade level you completed in school?: College     Information entered by :: NAllen LPN  Past Medical History:  Diagnosis Date  . Anxiety   . Constipation   . Depression    "following husband's death"  . Diabetes mellitus without  complication ([place]   . Hypertension   . Tinnitus    Past Surgical History:  Procedure Laterality Date  . ABDOMINAL HYSTERECTOMY    . fribroid surgery     Family History  Problem Relation Age of Onset  . Diabetes Mother   . Hypertension Father    Social History   Socioeconomic History  . Marital status: Widowed    Spouse name: Not on file  . Number of children: 0  . Years of education: 130 . Highest education level: Not on file  Occupational History  . Not on file  Social Needs  . Financial resource strain: Not hard at all  . Food insecurity:    Worry: Never true    Inability: Never true  . Transportation needs:    Medical: No    Non-medical: No  Tobacco Use  . Smoking status: Never Smoker  . Smokeless tobacco: Never Used  Substance and Sexual Activity  . Alcohol use: No  . Drug use: No  . Sexual activity: Never  Lifestyle  . Physical activity:    Days per week: 0 days    Minutes per session: 0 min  . Stress: Not at all  Relationships  . Social connections:    Talks on phone: Not on file    Gets together: Not on  file    Attends religious service: Not on file    Active member of club or organization: Not on file    Attends meetings of clubs or organizations: Not on file    Relationship status: Not on file  Other Topics Concern  . Not on file  Social History Narrative   Lives alone, husband passed away 2 1/2 years ago   Caffeine use- none    Outpatient Encounter Medications as of 10/04/2018  Medication Sig  . acetaminophen (TYLENOL) 325 MG tablet Take 325-650 mg by mouth every 6 (six) hours as needed for mild pain, moderate pain or headache. Reported on 05/10/2016  . Alcohol Swabs (ALCOHOL WIPES) 70 % PADS by Does not apply route. Use with blood sugar check and injection of insulin  . blood glucose meter kit and supplies KIT Dispense based on patient and insurance preference. Use up to four times daily as directed. (FOR ICD-9 250.00, 250.01).  . calcium  carbonate (OSCAL) 1500 (600 Ca) MG TABS tablet Take 600 mg of elemental calcium by mouth daily with breakfast.   . cholecalciferol (VITAMIN D) 1000 units tablet Take 2,000 Units by mouth daily.  . Continuous Blood Gluc Receiver (FREESTYLE LIBRE 14 DAY READER) DEVI by Does not apply route. Check blood sugar 4 times per day Dx E11.65  . Continuous Blood Gluc Sensor (FREESTYLE LIBRE 14 DAY SENSOR) MISC by Does not apply route. Check blood sugars 4 times per day. E11.65  . dorzolamide-timolol (COSOPT) 22.3-6.8 MG/ML ophthalmic solution Place 1 drop into both eyes 2 (two) times daily.  [name] EPINEPHrine (EPIPEN 2-PAK) 0.3 mg/0.3 mL IJ SOAJ injection Inject 0.3 mLs (0.3 mg total) into the muscle once as needed (for severe allergic reaction). CAll 911 immediately if you have to use this medicine  . glucose blood (ACCU-CHEK GUIDE) test strip 1 each by Other route as needed for other (check blood sugars by intradermal route 2 times per day. E11.65). Use as instructed  . insulin detemir (LEVEMIR) 100 UNIT/ML injection Inject 0.1 mLs (10 Units total) into the skin 2 (two) times daily.  . Insulin Pen Needle (PEN NEEDLES 31GX5/16") 31G X 8 MM MISC by Does not apply route. Use as directed with insulin pen  . Lancets Misc. (ACCU-CHEK FASTCLIX LANCET) KIT by Does not apply route. Stick by [employer] (Non-Drug Combo Route) check blood sugar before breakfast and dinner. Dx E11.65  . latanoprost (XALATAN) 0.005 % ophthalmic solution Place 1 drop into both eyes at bedtime.  [name] linagliptin (TRADJENTA) 5 MG TABS tablet Take 5 mg by mouth daily.  . meclizine (ANTIVERT) 25 MG tablet Take 1 tablet (25 mg total) by mouth 3 (three) times daily as needed for dizziness.  . meloxicam (MOBIC) 7.5 MG tablet Take 7.5 mg by mouth daily.  . metFORMIN (GLUCOPHAGE) 500 MG tablet Take 1 tablet (500 mg total) by mouth 2 (two) times daily with a meal.  . olmesartan (BENICAR) 20 MG tablet Take 1 tablet (20 mg total) by mouth daily.  . clobetasol  ointment (TEMOVATE) 0.86 % Apply 1 application topically 2 (two) times daily. (Patient not taking: Reported on 08/12/2018)  . diphenhydrAMINE (BENADRYL) 25 mg capsule Take 1 capsule (25 mg total) by mouth at bedtime as needed for allergies. (Patient not taking: Reported on 10/04/2018)  . fluticasone (FLONASE) 50 MCG/ACT nasal spray Place 2 sprays into both nostrils daily. (Patient not taking: Reported on 10/04/2018)  . pantoprazole (PROTONIX) 40 MG tablet Take 1 tablet (40 mg total) by mouth daily.  . [  DISCONTINUED] ALPRAZolam (XANAX) 0.25 MG tablet Take 1 tablet (0.25 mg total) by mouth 3 (three) times daily as needed for anxiety. (Patient not taking: Reported on 08/12/2018)  . [DISCONTINUED] Vitamin D, Ergocalciferol, 2000 units CAPS Take 1 tablet by mouth once.  . [DISCONTINUED] zolpidem (AMBIEN) 5 MG tablet Take 1 tablet (5 mg total) by mouth at bedtime as needed for sleep. (Patient not taking: Reported on 10/04/2018)   No facility-administered encounter medications on file as of 10/04/2018.     Activities of Daily Living In your present state of health, do you have any difficulty performing the following activities: 10/04/2018 07/03/2018  Hearing? Y N  Comment tinnitus in right ear -  Vision? Y N  Comment blurry vision at times -  Difficulty concentrating or making decisions? N N  Walking or climbing stairs? N N  Dressing or bathing? N N  Doing errands, shopping? N N  Preparing Food and eating ? N -  Using the Toilet? N -  In the past six months, have you accidently leaked urine? N -  Do you have problems with loss of bowel control? N -  Managing your Medications? N -  Managing your Finances? N -  Housekeeping or managing your Housekeeping? N -  Some recent data might be hidden    Patient Care Team: [name], FNP as PCP - General (General Practice) Pleasant, Eppie Gibson, RN as [facility] Management    Assessment:   This is a routine wellness examination for  [place].  Exercise Activities and Dietary recommendations Current Exercise Habits: The patient does not participate in regular exercise at present, Exercise limited by: None identified  Goals    . Exercise 150 min/wk Moderate Activity (pt-stated)       Fall Risk Fall Risk  10/04/2018 10/04/2018 08/10/2018 07/13/2018 11/02/2017  Falls in the past year? 0 0 No No No  Comment - - - - Emmi Telephone Survey: data to providers prior to load  Risk for fall due to : Medication side effect - - - -   Is the patient's home free of loose throw rugs in walkways, pet beds, electrical cords, etc?   yes      Grab bars in the bathroom? no      Handrails on the stairs?   yes      Adequate lighting?   yes  Timed Get Up and Go performed: n/a  Depression Screen PHQ 2/9 Scores 10/04/2018 10/04/2018 08/10/2018 07/13/2018  PHQ - 2 Score 0 0 1 1     Cognitive Function     6CIT Screen 10/04/2018  What Year? 0 points  What month? 0 points  What time? 0 points  Count back from 20 0 points  Months in reverse 0 points  Repeat phrase 0 points  Total Score 0    Immunization History  Administered Date(s) Administered  . Influenza Whole 11/18/2009  . Influenza-Unspecified 09/24/2018  . Pneumococcal Polysaccharide-23 02/01/2007  . Td 02/01/2007    Qualifies for Shingles Vaccine? yes  Screening Tests Health Maintenance  Topic Date Due  . FOOT EXAM  10/20/1951  . OPHTHALMOLOGY EXAM  10/20/1951  . DEXA SCAN  10/19/2006  . PNA vac Low Risk Adult (2 of 2 - PCV13) 02/01/2008  . TETANUS/TDAP  01/31/2017  . HEMOGLOBIN A1C  01/03/2019  . INFLUENZA VACCINE  Completed    Cancer Screenings: Lung: Low Dose CT Chest recommended if Age 10-80 years, 30 pack-year currently smoking OR have quit w/in  15years. Patient does not qualify. Breast:  Up to date on Mammogram? Yes   Up to date of Bone Density/Dexa? No Colorectal: cologuard  Additional Screenings: : Hepatitis C Screening: n/a     Plan:    Patient has  goal to exercise regularly.   I have personally reviewed and noted the following in the patient's chart:   . Medical and social history . Use of alcohol, tobacco or illicit drugs  . Current medications and supplements . Functional ability and status . Nutritional status . Physical activity . Advanced directives . List of other physicians . Hospitalizations, surgeries, and ER visits in previous 12 months . Vitals . Screenings to include cognitive, depression, and falls . Referrals and appointments  In addition, I have reviewed and discussed with patient certain preventive protocols, quality metrics, and best practice recommendations. A written personalized care plan for preventive services as well as general preventive health recommendations were provided to patient.     Kellie Simmering, LPN  96/05/[id number]

## 2018-10-04 NOTE — Patient Instructions (Addendum)
1-  DONE FORGET TO SCHEDULE YOUR NEURONIST VISIT  2- SNACK ON CHEESE STICKS OR NUTS INSTEAD OF CRACKERS   Diabetes Mellitus and Exercise Exercising regularly is important for your overall health, especially when you have diabetes (diabetes mellitus). Exercising is not only about losing weight. It has many health benefits, such as increasing muscle strength and bone density and reducing body fat and stress. This leads to improved fitness, flexibility, and endurance, all of which result in better overall health. Exercise has additional benefits for people with diabetes, including:  Reducing appetite.  Helping to lower and control blood glucose.  Lowering blood pressure.  Helping to control amounts of fatty substances (lipids) in the blood, such as cholesterol and triglycerides.  Helping the body to respond better to insulin (improving insulin sensitivity).  Reducing how much insulin the body needs.  Decreasing the risk for heart disease by: ? Lowering cholesterol and triglyceride levels. ? Increasing the levels of good cholesterol. ? Lowering blood glucose levels.  What is my activity plan? Your health care provider or certified diabetes educator can help you make a plan for the type and frequency of exercise (activity plan) that works for you. Make sure that you:  Do at least 150 minutes of moderate-intensity or vigorous-intensity exercise each week. This could be brisk walking, biking, or water aerobics. ? Do stretching and strength exercises, such as yoga or weightlifting, at least 2 times a week. ? Spread out your activity over at least 3 days of the week.  Get some form of physical activity every day. ? Do not go more than 2 days in a row without some kind of physical activity. ? Avoid being inactive for more than 90 minutes at a time. Take frequent breaks to walk or stretch.  Choose a type of exercise or activity that you enjoy, and set realistic goals.  Start slowly, and  gradually increase the intensity of your exercise over time.  What do I need to know about managing my diabetes?  Check your blood glucose before and after exercising. ? If your blood glucose is higher than 240 mg/dL (13.3 mmol/L) before you exercise, check your urine for ketones. If you have ketones in your urine, do not exercise until your blood glucose returns to normal.  Know the symptoms of low blood glucose (hypoglycemia) and how to treat it. Your risk for hypoglycemia increases during and after exercise. Common symptoms of hypoglycemia can include: ? Hunger. ? Anxiety. ? Sweating and feeling clammy. ? Confusion. ? Dizziness or feeling light-headed. ? Increased heart rate or palpitations. ? Blurry vision. ? Tingling or numbness around the mouth, lips, or tongue. ? Tremors or shakes. ? Irritability.  Keep a rapid-acting carbohydrate snack available before, during, and after exercise to help prevent or treat hypoglycemia.  Avoid injecting insulin into areas of the body that are going to be exercised. For example, avoid injecting insulin into: ? The arms, when playing tennis. ? The legs, when jogging.  Keep records of your exercise habits. Doing this can help you and your health care provider adjust your diabetes management plan as needed. Write down: ? Food that you eat before and after you exercise. ? Blood glucose levels before and after you exercise. ? The type and amount of exercise you have done. ? When your insulin is expected to peak, if you use insulin. Avoid exercising at times when your insulin is peaking.  When you start a new exercise or activity, work with your health care  provider to make sure the activity is safe for you, and to adjust your insulin, medicines, or food intake as needed.  Drink plenty of water while you exercise to prevent dehydration or heat stroke. Drink enough fluid to keep your urine clear or pale yellow. This information is not intended to  replace advice given to you by your health care provider. Make sure you discuss any questions you have with your health care provider. Document Released: 02/04/2004 Document Revised: 06/03/2016 Document Reviewed: 04/25/2016 Elsevier Interactive Patient Education  2018 [employer].

## 2018-10-04 NOTE — Progress Notes (Signed)
Subjective:     Patient ID: Allison Franco , female    DOB: 08/12/1941 , 77 y.o.   MRN: [id number]   Chief Complaint  Patient presents with  . Diabetes    HPI Glucose  Has been 98-99. Post prandial sometimes goes to 200.  She has not call the nutritionist. She denies any hypoglycemia episodes. She has not been using her insulin at all. Feels much better this way, and has been trying to snack on nuts instead of crackers.     Past Medical History:  Diagnosis Date  . Anxiety   . Constipation   . Depression    "following husband's death"  . Diabetes mellitus without complication ([place])   . Hypertension   . Tinnitus      Family History  Problem Relation Age of Onset  . Diabetes Mother   . Hypertension Father      Current Outpatient Medications:  .  acetaminophen (TYLENOL) 325 MG tablet, Take 325-650 mg by mouth every 6 (six) hours as needed for mild pain, moderate pain or headache. Reported on 05/10/2016, Disp: , Rfl:  .  Alcohol Swabs (ALCOHOL WIPES) 70 % PADS, by Does not apply route. Use with blood sugar check and injection of insulin, Disp: , Rfl:  .  blood glucose meter kit and supplies KIT, Dispense based on patient and insurance preference. Use up to four times daily as directed. (FOR ICD-9 250.00, 250.01)., Disp: 1 each, Rfl: 0 .  calcium carbonate (OSCAL) 1500 (600 Ca) MG TABS tablet, Take 600 mg of elemental calcium by mouth daily with breakfast. , Disp: , Rfl:  .  cholecalciferol (VITAMIN D) 1000 units tablet, Take 2,000 Units by mouth daily., Disp: , Rfl:  .  Continuous Blood Gluc Receiver (FREESTYLE LIBRE 14 DAY READER) DEVI, by Does not apply route. Check blood sugar 4 times per day Dx E11.65, Disp: , Rfl:  .  Continuous Blood Gluc Sensor (FREESTYLE LIBRE 14 DAY SENSOR) MISC, by Does not apply route. Check blood sugars 4 times per day. E11.65, Disp: , Rfl:  .  dorzolamide-timolol (COSOPT) 22.3-6.8 MG/ML ophthalmic solution, Place 1 drop into both eyes 2 (two) times  daily., Disp: , Rfl: 5 .  EPINEPHrine (EPIPEN 2-PAK) 0.3 mg/0.3 mL IJ SOAJ injection, Inject 0.3 mLs (0.3 mg total) into the muscle once as needed (for severe allergic reaction). CAll 911 immediately if you have to use this medicine, Disp: 1 Device, Rfl: 1 .  glucose blood (ACCU-CHEK GUIDE) test strip, 1 each by Other route as needed for other (check blood sugars by intradermal route 2 times per day. E11.65). Use as instructed, Disp: , Rfl:  .  Lancets Misc. (ACCU-CHEK FASTCLIX LANCET) KIT, by Does not apply route. Stick by [employer] (Non-Drug Combo Route) check blood sugar before breakfast and dinner. Dx E11.65, Disp: , Rfl:  .  latanoprost (XALATAN) 0.005 % ophthalmic solution, Place 1 drop into both eyes at bedtime., Disp: , Rfl: 5 .  linagliptin (TRADJENTA) 5 MG TABS tablet, Take 5 mg by mouth daily., Disp: , Rfl:  .  meclizine (ANTIVERT) 25 MG tablet, Take 1 tablet (25 mg total) by mouth 3 (three) times daily as needed for dizziness., Disp: 30 tablet, Rfl: 0 .  meloxicam (MOBIC) 7.5 MG tablet, Take 7.5 mg by mouth daily., Disp: , Rfl:  .  metFORMIN (GLUCOPHAGE) 500 MG tablet, Take 1 tablet (500 mg total) by mouth 2 (two) times daily with a meal., Disp: 60 tablet, Rfl: 0 .  olmesartan (  BENICAR) 20 MG tablet, Take 1 tablet (20 mg total) by mouth daily., Disp: 30 tablet, Rfl: 0 .  pantoprazole (PROTONIX) 40 MG tablet, Take 1 tablet (40 mg total) by mouth daily., Disp: 30 tablet, Rfl: 0 .  clobetasol ointment (TEMOVATE) 4.01 %, Apply 1 application topically 2 (two) times daily. (Patient not taking: Reported on 08/12/2018), Disp: 30 g, Rfl: 0 .  diphenhydrAMINE (BENADRYL) 25 mg capsule, Take 1 capsule (25 mg total) by mouth at bedtime as needed for allergies. (Patient not taking: Reported on 10/04/2018), Disp: 15 capsule, Rfl: 0 .  fluticasone (FLONASE) 50 MCG/ACT nasal spray, Place 2 sprays into both nostrils daily. (Patient not taking: Reported on 10/04/2018), Disp: 1 g, Rfl: 0 .  insulin detemir (LEVEMIR)  100 UNIT/ML injection, Inject 0.1 mLs (10 Units total) into the skin 2 (two) times daily., Disp: 10 mL, Rfl: 0 .  Insulin Pen Needle (PEN NEEDLES 31GX5/16") 31G X 8 MM MISC, by Does not apply route. Use as directed with insulin pen, Disp: , Rfl:    Allergies  Allergen Reactions  . Aspirin Other (See Comments)    Allergic to Adult Aspirin. Cant sleep.   [name] Hydroxyzine Other (See Comments)    insomnia  . Valium [Diazepam] Other (See Comments)    Insomnia, 05/10/16 pt states she took as young person- made her depressed     Review of Systems  Constitutional: Negative for chills, diaphoresis and fatigue.  Respiratory: Negative for chest tightness and shortness of breath.   Cardiovascular: Negative for chest pain, palpitations and leg swelling.  Gastrointestinal: Negative for constipation and diarrhea.  Genitourinary: Negative for dysuria.  Skin: Negative for rash.  Neurological: Negative for numbness and headaches.     Today's Vitals   10/04/18 1437  BP: 126/68  Pulse: 85  Temp: (!) 97.5 F (36.4 C)  TempSrc: Oral  SpO2: 96%  Weight: 175 lb (79.4 kg)  Height: 5' 1.5" (1.562 m)  PainSc: 0-No pain   Body mass index is 32.53 kg/m.   Objective:  Physical Exam  Constitutional: She is oriented to person, place, and time. She appears well-developed and well-nourished. No distress.  HENT:  Head: Normocephalic and atraumatic.  Right Ear: External ear normal.  Left Ear: External ear normal.  Nose: Nose normal.  Eyes: Conjunctivae are normal. Right eye exhibits no discharge. Left eye exhibits no discharge. No scleral icterus.  Neck: Neck supple. No thyromegaly present.  Cardiovascular: Normal rate and regular rhythm.  No murmur heard. Pulmonary/Chest: Effort normal and breath sounds normal. No respiratory distress.  Musculoskeletal: Normal range of motion.  Lymphadenopathy:   She has no cervical adenopathy.  Neurological: She is alert and oriented to person, place, and time.   Skin: Skin is warm and dry. Capillary refill takes less than 2 seconds. No rash noted. She is not diaphoretic.  Psychiatric: She has a normal mood and affect. Her behavior is normal. Judgment and thought content normal.  Nursing note reviewed.  Assessment And Plan:     1. Essential hypertension- stable - COMPLETE METABOLIC PANEL WITH GFR    CBC 2. Uncontrolled type 2 diabetes mellitus with hyperglycemia ([place])- chronic. If lab is abnormal, I may add Jardiance - Hemoglobin A1c - COMPLETE METABOLIC PANEL WITH GFR  She was encouraged to snack on nuts and cheese stick or other proteins and avoid too many carbs and to  Call the neuronist for visit.  FU 1 month   Bet time to call pt is after 5:30 pm  Except Thursdays.   RODRIGUEZ-SOUTHWORTH, PA-C

## 2018-10-04 NOTE — Patient Instructions (Signed)
[name] , Thank you for taking time to come for your Medicare Wellness Visit. I appreciate your ongoing commitment to your health goals. Please review the following plan we discussed and let me know if I can assist you in the future.   Screening recommendations/referrals: Colonoscopy: cologuard Mammogram: pt. Will make appointment Bone Density: due Recommended yearly ophthalmology/optometry visit for glaucoma screening and checkup Recommended yearly dental visit for hygiene and checkup  Vaccinations: Influenza vaccine: 08/2018 Pneumococcal vaccine: prevnar 13 sent to pharmacy Tdap vaccine: sent to pharmacy Shingles vaccine:  dec    Advanced directives: Advance directive discussed with you today. I have provided a copy for you to complete at home and have notarized. Once this is complete please bring a copy in to our office so we can scan it into your chart.   Conditions/risks identified: Obesity: Does not exercise regularly now, but has a goal to start  Next appointment: 01/10/2019 at 2:00p   Preventive Care 65 Years and Older, Female Preventive care refers to lifestyle choices and visits with your health care provider that can promote health and wellness. What does preventive care include?  A yearly physical exam. This is also called an annual well check.  Dental exams once or twice a year.  Routine eye exams. Ask your health care provider how often you should have your eyes checked.  Personal lifestyle choices, including:  Daily care of your teeth and gums.  Regular physical activity.  Eating a healthy diet.  Avoiding tobacco and drug use.  Limiting alcohol use.  Practicing safe sex.  Taking low-dose aspirin every day.  Taking vitamin and mineral supplements as recommended by your health care provider. What happens during an annual well check? The services and screenings done by your health care provider during your annual well check will depend on your age,  overall health, lifestyle risk factors, and family history of disease. Counseling  Your health care provider may ask you questions about your:  Alcohol use.  Tobacco use.  Drug use.  Emotional well-being.  Home and relationship well-being.  Sexual activity.  Eating habits.  History of falls.  Memory and ability to understand (cognition).  Work and work Statistician.  Reproductive health. Screening  You may have the following tests or measurements:  Height, weight, and BMI.  Blood pressure.  Lipid and cholesterol levels. These may be checked every 5 years, or more frequently if you are over 15 years old.  Skin check.  Lung cancer screening. You may have this screening every year starting at age 77 if you have a 30-pack-year history of smoking and currently smoke or have quit within the past 15 years.  Fecal occult blood test (FOBT) of the stool. You may have this test every year starting at age 63.  Flexible sigmoidoscopy or colonoscopy. You may have a sigmoidoscopy every 5 years or a colonoscopy every 10 years starting at age 57.  Hepatitis C blood test.  Hepatitis B blood test.  Sexually transmitted disease (STD) testing.  Diabetes screening. This is done by checking your blood sugar (glucose) after you have not eaten for a while (fasting). You may have this done every 1-3 years.  Bone density scan. This is done to screen for osteoporosis. You may have this done starting at age 14.  Mammogram. This may be done every 1-2 years. Talk to your health care provider about how often you should have regular mammograms. Talk with your health care provider about your test results, treatment options,  and if necessary, the need for more tests. Vaccines  Your health care provider may recommend certain vaccines, such as:  Influenza vaccine. This is recommended every year.  Tetanus, diphtheria, and acellular pertussis (Tdap, Td) vaccine. You may need a Td booster every 10  years.  Zoster vaccine. You may need this after age 14.  Pneumococcal 13-valent conjugate (PCV13) vaccine. One dose is recommended after age 1.  Pneumococcal polysaccharide (PPSV23) vaccine. One dose is recommended after age 46. Talk to your health care provider about which screenings and vaccines you need and how often you need them. This information is not intended to replace advice given to you by your health care provider. Make sure you discuss any questions you have with your health care provider. Document Released: 12/11/2015 Document Revised: 08/03/2016 Document Reviewed: 09/15/2015 Elsevier Interactive Patient Education  2017 [place] Prevention in the Home Falls can cause injuries. They can happen to people of all ages. There are many things you can do to make your home safe and to help prevent falls. What can I do on the outside of my home?  Regularly fix the edges of walkways and driveways and fix any cracks.  Remove anything that might make you trip as you walk through a door, such as a raised step or threshold.  Trim any bushes or trees on the path to your home.  Use bright outdoor lighting.  Clear any walking paths of anything that might make someone trip, such as rocks or tools.  Regularly check to see if handrails are loose or broken. Make sure that both sides of any steps have handrails.  Any raised decks and porches should have guardrails on the edges.  Have any leaves, snow, or ice cleared regularly.  Use sand or salt on walking paths during winter.  Clean up any spills in your garage right away. This includes oil or grease spills. What can I do in the bathroom?  Use night lights.  Install grab bars by the toilet and in the tub and shower. Do not use towel bars as grab bars.  Use non-skid mats or decals in the tub or shower.  If you need to sit down in the shower, use a plastic, non-slip stool.  Keep the floor dry. Clean up any water that  spills on the floor as soon as it happens.  Remove soap buildup in the tub or shower regularly.  Attach bath mats securely with double-sided non-slip rug tape.  Do not have throw rugs and other things on the floor that can make you trip. What can I do in the bedroom?  Use night lights.  Make sure that you have a light by your bed that is easy to reach.  Do not use any sheets or blankets that are too big for your bed. They should not hang down onto the floor.  Have a firm chair that has side arms. You can use this for support while you get dressed.  Do not have throw rugs and other things on the floor that can make you trip. What can I do in the kitchen?  Clean up any spills right away.  Avoid walking on wet floors.  Keep items that you use a lot in easy-to-reach places.  If you need to reach something above you, use a strong step stool that has a grab bar.  Keep electrical cords out of the way.  Do not use floor polish or wax that makes floors slippery. If  you must use wax, use non-skid floor wax.  Do not have throw rugs and other things on the floor that can make you trip. What can I do with my stairs?  Do not leave any items on the stairs.  Make sure that there are handrails on both sides of the stairs and use them. Fix handrails that are broken or loose. Make sure that handrails are as long as the stairways.  Check any carpeting to make sure that it is firmly attached to the stairs. Fix any carpet that is loose or worn.  Avoid having throw rugs at the top or bottom of the stairs. If you do have throw rugs, attach them to the floor with carpet tape.  Make sure that you have a light switch at the top of the stairs and the bottom of the stairs. If you do not have them, ask someone to add them for you. What else can I do to help prevent falls?  Wear shoes that:  Do not have high heels.  Have rubber bottoms.  Are comfortable and fit you well.  Are closed at the  toe. Do not wear sandals.  If you use a stepladder:  Make sure that it is fully opened. Do not climb a closed stepladder.  Make sure that both sides of the stepladder are locked into place.  Ask someone to hold it for you, if possible.  Clearly mark and make sure that you can see:  Any grab bars or handrails.  First and last steps.  Where the edge of each step is.  Use tools that help you move around (mobility aids) if they are needed. These include:  Canes.  Walkers.  Scooters.  Crutches.  Turn on the lights when you go into a dark area. Replace any light bulbs as soon as they burn out.  Set up your furniture so you have a clear path. Avoid moving your furniture around.  If any of your floors are uneven, fix them.  If there are any pets around you, be aware of where they are.  Review your medicines with your doctor. Some medicines can make you feel dizzy. This can increase your chance of falling. Ask your doctor what other things that you can do to help prevent falls. This information is not intended to replace advice given to you by your health care provider. Make sure you discuss any questions you have with your health care provider. Document Released: 09/10/2009 Document Revised: 04/21/2016 Document Reviewed: 12/19/2014 Elsevier Interactive Patient Education  2017 [employer].

## 2018-10-05 LAB — COMPREHENSIVE METABOLIC PANEL
A/G RATIO: 1.4 (ref 1.2–2.2)
ALK PHOS: 94 IU/L (ref 39–117)
ALT: 14 IU/L (ref 0–32)
AST: 15 IU/L (ref 0–40)
Albumin: 3.9 g/dL (ref 3.5–4.8)
BILIRUBIN TOTAL: 0.2 mg/dL (ref 0.0–1.2)
BUN/Creatinine Ratio: 20 (ref 12–28)
BUN: 16 mg/dL (ref 8–27)
CHLORIDE: 99 mmol/L (ref 96–106)
CO2: 25 mmol/L (ref 20–29)
Calcium: 9.3 mg/dL (ref 8.7–10.3)
Creatinine, Ser: 0.8 mg/dL (ref 0.57–1.00)
GFR calc Af Amer: 83 mL/min/{1.73_m2} (ref >59)
GFR calc non Af Amer: 72 mL/min/{1.73_m2} (ref >59)
GLUCOSE: 152 mg/dL — AB (ref 65–99)
Globulin, Total: 2.7 g/dL (ref 1.5–4.5)
POTASSIUM: 3.9 mmol/L (ref 3.5–5.2)
Sodium: 136 mmol/L (ref 134–144)
Total Protein: 6.6 g/dL (ref 6.0–8.5)

## 2018-10-05 LAB — HEMOGLOBIN A1C
ESTIMATED AVERAGE GLUCOSE: 131 mg/dL
HEMOGLOBIN A1C: 6.2 % — AB (ref 4.8–5.6)

## 2018-11-03 ENCOUNTER — Other Ambulatory Visit: Payer: Self-pay

## 2018-11-03 ENCOUNTER — Emergency Department (HOSPITAL_COMMUNITY): Payer: Medicare Other

## 2018-11-03 ENCOUNTER — Encounter (HOSPITAL_COMMUNITY): Payer: Self-pay | Admitting: Emergency Medicine

## 2018-11-03 ENCOUNTER — Emergency Department (HOSPITAL_COMMUNITY)
Admission: EM | Admit: 2018-11-03 | Discharge: 2018-11-04 | Disposition: A | Discharge status: Home / Self Care | Payer: Medicare Other | Attending: Emergency Medicine | Admitting: Emergency Medicine

## 2018-11-03 ENCOUNTER — Ambulatory Visit (HOSPITAL_COMMUNITY)
Admission: EM | Admit: 2018-11-03 | Discharge: 2018-11-03 | Disposition: A | Discharge status: Home / Self Care | Payer: Medicare Other | Source: Home / Self Care

## 2018-11-03 DIAGNOSIS — R42 Dizziness and giddiness: Secondary | ICD-10-CM | POA: Diagnosis not present

## 2018-11-03 DIAGNOSIS — Z79899 Other long term (current) drug therapy: Secondary | ICD-10-CM | POA: Diagnosis not present

## 2018-11-03 DIAGNOSIS — I10 Essential (primary) hypertension: Secondary | ICD-10-CM | POA: Diagnosis not present

## 2018-11-03 DIAGNOSIS — Z7984 Long term (current) use of oral hypoglycemic drugs: Secondary | ICD-10-CM | POA: Insufficient documentation

## 2018-11-03 DIAGNOSIS — R270 Ataxia, unspecified: Secondary | ICD-10-CM | POA: Diagnosis not present

## 2018-11-03 DIAGNOSIS — E1165 Type 2 diabetes mellitus with hyperglycemia: Secondary | ICD-10-CM | POA: Insufficient documentation

## 2018-11-03 LAB — CBC
HCT: 46.7 % — ABNORMAL HIGH (ref 36.0–46.0)
Hemoglobin: 13.8 g/dL (ref 12.0–15.0)
MCH: 26.6 pg (ref 26.0–34.0)
MCHC: 29.6 g/dL — ABNORMAL LOW (ref 30.0–36.0)
MCV: 90.2 fL (ref 80.0–100.0)
NRBC: 0 % (ref 0.0–0.2)
Platelets: 280 10*3/uL (ref 150–400)
RBC: 5.18 MIL/uL — ABNORMAL HIGH (ref 3.87–5.11)
RDW: 12.8 % (ref 11.5–15.5)
WBC: 8.8 10*3/uL (ref 4.0–10.5)

## 2018-11-03 LAB — COMPREHENSIVE METABOLIC PANEL
ALK PHOS: 87 U/L (ref 38–126)
ALT: 22 U/L (ref 0–44)
AST: 25 U/L (ref 15–41)
Albumin: 3.9 g/dL (ref 3.5–5.0)
Anion gap: 11 (ref 5–15)
BUN: 16 mg/dL (ref 8–23)
CALCIUM: 9.3 mg/dL (ref 8.9–10.3)
CO2: 26 mmol/L (ref 22–32)
Chloride: 99 mmol/L (ref 98–111)
Creatinine, Ser: 0.83 mg/dL (ref 0.44–1.00)
Glucose, Bld: 162 mg/dL — ABNORMAL HIGH (ref 70–99)
Potassium: 3.9 mmol/L (ref 3.5–5.1)
Sodium: 136 mmol/L (ref 135–145)
Total Bilirubin: 1 mg/dL (ref 0.3–1.2)
Total Protein: 7.4 g/dL (ref 6.5–8.1)

## 2018-11-03 LAB — GLUCOSE, CAPILLARY: Glucose-Capillary: 140 mg/dL — ABNORMAL HIGH (ref 70–99)

## 2018-11-03 LAB — I-STAT TROPONIN, ED: Troponin i, poc: 0 ng/mL (ref 0.00–0.08)

## 2018-11-03 LAB — URINALYSIS, ROUTINE W REFLEX MICROSCOPIC
Bilirubin Urine: NEGATIVE
Glucose, UA: NEGATIVE mg/dL
Hgb urine dipstick: NEGATIVE
KETONES UR: NEGATIVE mg/dL
LEUKOCYTES UA: NEGATIVE
NITRITE: NEGATIVE
PROTEIN: NEGATIVE mg/dL
Specific Gravity, Urine: 1.015 (ref 1.005–1.030)
pH: 9 — ABNORMAL HIGH (ref 5.0–8.0)

## 2018-11-03 LAB — CBG MONITORING, ED: Glucose-Capillary: 153 mg/dL — ABNORMAL HIGH (ref 70–99)

## 2018-11-03 LAB — LIPASE, BLOOD: Lipase: 35 U/L (ref 11–51)

## 2018-11-03 MED ORDER — MECLIZINE HCL 25 MG PO TABS
12.5000 mg | ORAL_TABLET | Freq: Once | ORAL | Status: AC
  Administered 2018-11-03: 12.5 mg via ORAL
  Filled 2018-11-03: qty 1

## 2018-11-03 MED ORDER — ONDANSETRON HCL 4 MG/2ML IJ SOLN
4.0000 mg | Freq: Once | INTRAMUSCULAR | Status: AC | PRN
  Administered 2018-11-03: 4 mg via INTRAVENOUS
  Filled 2018-11-03: qty 2

## 2018-11-03 MED ORDER — GADOBUTROL 1 MMOL/ML IV SOLN
7.0000 mL | Freq: Once | INTRAVENOUS | Status: AC | PRN
  Administered 2018-11-03: 7 mL via INTRAVENOUS

## 2018-11-03 NOTE — ED Notes (Signed)
MD bedside

## 2018-11-03 NOTE — ED Notes (Signed)
Pt ambulatory to the restroom.  

## 2018-11-03 NOTE — ED Notes (Signed)
Patient currently at MRI

## 2018-11-03 NOTE — ED Notes (Signed)
Delay in lab draw,  Pt not in hallway

## 2018-11-03 NOTE — ED Provider Notes (Signed)
[facility] EMERGENCY DEPARTMENT Provider Note   CSN: [id number] Arrival date & time: 11/03/18  1711     History   Chief Complaint Chief Complaint  Patient presents with  . Dizziness    HPI Allison Franco is a 77 y.o. female.  Patient is a 77 year old female with past medical history significant for dizziness.  Patient states that she felt as though the room started to spin about 12:00 when she left church.  Patient states that she took her meclizine at home, however her symptoms did not resolve and she had multiple episodes of emesis.  Upon arrival patient states that she is feeling dizzy but not continuing to vomit.  Symptoms are somewhat resolved but not completely.  Patient states that she has a history of high blood pressure diabetes but otherwise takes all of her medications as prescribed no history of stroke.  The history is provided by the patient.    Past Medical History:  Diagnosis Date  . Anxiety   . Constipation   . Depression    "following husband's death"  . Diabetes mellitus without complication ([place])   . Hypertension   . Tinnitus     Patient Active Problem List   Diagnosis Date Noted  . Anemia 08/31/2018  . Controlled type 2 diabetes mellitus with hyperglycemia ([place]) 08/31/2018  . Other headache syndrome 07/03/2018  . Essential hypertension 07/03/2018  . Uncontrolled diabetes mellitus type 2 without complications ([place]) 90/24/0973  . Grief reaction 10/20/2013  . Hyponatremia 10/20/2013  . Obesity 10/20/2013  . Atypical chest pain 09/01/2013  . ALLERGIC RHINITIS 08/11/2009  . KNEE PAIN, BILATERAL 02/13/2009  . COUGH 02/13/2009  . INSOMNIA 07/17/2008  . TIREDNESS 04/16/2008  . HYPERTENSION 01/24/2008  . BACK PAIN, LUMBAR 01/22/2008  . HYPERGLYCEMIA, BORDERLINE 01/27/2007    Past Surgical History:  Procedure Laterality Date  . ABDOMINAL HYSTERECTOMY    . fribroid surgery       OB History   None      Home Medications     Prior to Admission medications   Medication Sig Start Date End Date Taking? Authorizing Provider  acetaminophen (TYLENOL) 500 MG tablet Take 1,000 mg by mouth every 6 (six) hours as needed for mild pain, moderate pain or headache. Reported on 05/10/2016   Yes [provider]  Calcium 600-200 MG-UNIT tablet Take 1 tablet by mouth daily.   Yes [provider]  Cholecalciferol (VITAMIN D) 50 MCG (2000 UT) tablet Take 2,000 Units by mouth daily.    Yes [provider]  dorzolamide-timolol (COSOPT) 22.3-6.8 MG/ML ophthalmic solution Place 1 drop into both eyes 2 (two) times daily. 08/02/18  Yes [provider]  metFORMIN (GLUCOPHAGE) 500 MG tablet Take 1 tablet (500 mg total) by mouth 2 (two) times daily with a meal. 07/05/18 07/05/19 Yes Florencia Reasons, MD  Multiple Vitamin (MULTIVITAMIN WITH MINERALS) TABS tablet Take 1 tablet by mouth daily. Centrum   Yes [provider]  olmesartan (BENICAR) 20 MG tablet Take 1 tablet (20 mg total) by mouth daily. Patient taking differently: Take 20 mg by mouth daily as needed (Blood pressure >150/90).  07/11/18  Yes Domenic Moras, PA-C  Alcohol Swabs (ALCOHOL WIPES) 70 % PADS by Does not apply route. Use with blood sugar check and injection of insulin    [provider]  blood glucose meter kit and supplies KIT Dispense based on patient and insurance preference. Use up to four times daily as directed. (FOR ICD-9 250.00, 250.01). 07/05/18  Florencia Reasons, MD  clobetasol ointment (TEMOVATE) 0.37 % Apply 1 application topically 2 (two) times daily. Patient not taking: Reported on 08/12/2018 06/14/18   Raylene Everts, MD  Continuous Blood Gluc Receiver (FREESTYLE LIBRE 14 DAY READER) DEVI by Does not apply route. Check blood sugar 4 times per day Dx E11.65    [provider]  Continuous Blood Gluc Sensor (FREESTYLE LIBRE 14 DAY SENSOR) MISC by Does not apply route. Check blood sugars 4 times per day. E11.65    [provider]  diphenhydrAMINE (BENADRYL) 25 mg capsule Take 1 capsule (25 mg total) by mouth at bedtime as needed for allergies. Patient not taking: Reported on 10/04/2018 12/01/17   Ok Edwards, PA-C  EPINEPHrine (EPIPEN 2-PAK) 0.3 mg/0.3 mL IJ SOAJ injection Inject 0.3 mLs (0.3 mg total) into the muscle once as needed (for severe allergic reaction). CAll 911 immediately if you have to use this medicine Patient not taking: Reported on 11/03/2018 09/10/17   Larene Pickett, PA-C  fluticasone [facility]) 50 MCG/ACT nasal spray Place 2 sprays into both nostrils daily. Patient not taking: Reported on 10/04/2018 01/05/18   Ok Edwards, PA-C  glucose blood (ACCU-CHEK GUIDE) test strip 1 each by Other route as needed for other (check blood sugars by intradermal route 2 times per day. E11.65). Use as instructed    [provider]  insulin detemir (LEVEMIR) 100 UNIT/ML injection Inject 0.1 mLs (10 Units total) into the skin 2 (two) times daily. Patient not taking: Reported on 11/03/2018 07/05/18   Florencia Reasons, MD  Insulin Pen Needle (PEN NEEDLES 31GX5/16") 31G X 8 MM MISC by Does not apply route. Use as directed with insulin pen    [provider]  Lancets Misc. (ACCU-CHEK FASTCLIX LANCET) KIT by Does not apply route. Stick by [employer] (Non-Drug Combo Route) check blood sugar before breakfast and dinner. Dx E11.65    [provider]  meclizine (ANTIVERT) 25 MG tablet Take 1 tablet (25 mg total) by mouth 3 (three) times daily as needed for dizziness. 11/04/18   Erskine Squibb, MD  pantoprazole (PROTONIX) 40 MG tablet Take 1 tablet (40 mg total) by mouth daily. Patient not taking: Reported on 08/03/4382 07/16/39   Delora Fuel, MD    Family History Family History  Problem Relation Age of Onset  . Diabetes Mother   . Hypertension Father     Social History Social History   Tobacco Use  . Smoking status: Never Smoker  . Smokeless tobacco: Never Used  Substance Use Topics  . Alcohol use: No  .  Drug use: No     Allergies   Aspirin; Hydroxyzine; and Valium [diazepam]   Review of Systems Review of Systems  Constitutional: Positive for activity change. Negative for chills and fever.  HENT: Negative for ear pain and sore throat.   Eyes: Negative for pain and visual disturbance.  Respiratory: Negative for cough and shortness of breath.   Cardiovascular: Negative for chest pain and palpitations.  Gastrointestinal: Negative for abdominal pain and vomiting.  Genitourinary: Negative for dysuria and hematuria.  Musculoskeletal: Negative for arthralgias and back pain.  Skin: Negative for color change and rash.  Neurological: Positive for dizziness. Negative for tremors, seizures, syncope, facial asymmetry, speech difficulty, weakness, light-headedness, numbness and headaches.  All other systems reviewed and are negative.    Physical Exam Updated Vital Signs BP 132/81 (BP Location: Right Arm)   Pulse 89   Temp 98.3 F (36.8 C) (Oral)  Resp 16   Ht _0  (1.6 m)   Wt 78.9 kg   SpO2 98%   BMI 30.82 kg/m   Physical Exam  Constitutional: She is oriented to person, place, and time. She appears well-developed and well-nourished. No distress.  HENT:  Head: Normocephalic and atraumatic.  Eyes: Conjunctivae are normal.  Neck: Neck supple.  Cardiovascular: Normal rate and regular rhythm.  No murmur heard. Pulmonary/Chest: Effort normal and breath sounds normal. No respiratory distress.  Abdominal: Soft. There is no tenderness.  Musculoskeletal: She exhibits no edema.  Neurological: She is alert and oriented to person, place, and time. She has normal strength. No cranial nerve deficit or sensory deficit. GCS eye subscore is 4. GCS verbal subscore is 5. GCS motor subscore is 6.  Skin: Skin is warm and dry.  Psychiatric: She has a normal mood and affect.  Nursing note and vitals reviewed.    ED Treatments / Results  Labs (all labs ordered are listed, but only abnormal  results are displayed) Labs Reviewed  COMPREHENSIVE METABOLIC PANEL - Abnormal; Notable for the following components:      Result Value   Glucose, Bld 162 (*)    All other components within normal limits  CBC - Abnormal; Notable for the following components:   RBC 5.18 (*)    HCT 46.7 (*)    MCHC 29.6 (*)    All other components within normal limits  URINALYSIS, ROUTINE W REFLEX MICROSCOPIC - Abnormal; Notable for the following components:   APPearance CLOUDY (*)    pH 9.0 (*)    All other components within normal limits  CBG MONITORING, ED - Abnormal; Notable for the following components:   Glucose-Capillary 153 (*)    All other components within normal limits  LIPASE, BLOOD  I-STAT TROPONIN, ED  I-STAT TROPONIN, ED    EKG EKG Interpretation  Date/Time:  Saturday November 03 2018 17:23:32 EST Ventricular Rate:  76 PR Interval:    QRS Duration: 82 QT Interval:  369 QTC Calculation: 415 R Axis:   20 Text Interpretation:  Sinus rhythm LAE, consider biatrial enlargement Low voltage, precordial leads Consider anterior infarct No significant change since last tracing Confirmed by Blanchie Dessert ([id number]) on 11/03/2018 5:34:07 PM   Radiology Mr Brain W And Wo Contrast  Result Date: 11/04/2018 CLINICAL DATA:  Initial evaluation for acute ataxia, dizziness. Stroke suspected. EXAM: MRI HEAD WITHOUT AND WITH CONTRAST TECHNIQUE: Multiplanar, multiecho pulse sequences of the brain and surrounding structures were obtained without and with intravenous contrast. CONTRAST:  7 cc of Gadavist. COMPARISON:  Prior MRI from 08/12/2018. FINDINGS: Brain: Generalized mild age-related cerebral atrophy. Patchy and confluent T2/FLAIR hyperintensities within the periventricular, deep, and subcortical white matter both cerebral hemispheres, most likely related chronic microvascular disease, stable from previous. No abnormal foci of restricted diffusion to suggest acute or subacute ischemia. Gray-white  matter differentiation maintained. No encephalomalacia to suggest chronic cortical infarction. No foci of susceptibility artifact to suggest acute or chronic intracranial hemorrhage. No mass lesion, midline shift or mass effect. No hydrocephalus. No extra-axial fluid collection. Incidental note made of an empty sella. No abnormal enhancement. Vascular: Major intracranial vascular flow voids maintained. Skull and upper cervical spine: Craniocervical junction within normal limits. 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 Bone marrow signal intensity within normal limits. No scalp soft tissue abnormality. Sinuses/Orbits: Globes normal soft tissues within normal limits. Mild scattered mucosal thickening throughout the paranasal sinuses. No air-fluid level to suggest acute  sinusitis. No significant mastoid effusion. Inner ear structures normal. Other: None. IMPRESSION: 1. No acute intracranial infarct or other abnormality identified. 2. Generalized mild age-related cerebral atrophy with chronic microvascular ischemic disease, stable. Electronically Signed   By: Jeannine Boga M.D.   On: 11/04/2018 00:00    Procedures Procedures (including critical care time)  Medications Ordered in ED Medications  ondansetron (ZOFRAN) injection 4 mg (4 mg Intravenous Given 11/03/18 1728)  meclizine (ANTIVERT) tablet 12.5 mg (12.5 mg Oral Given 11/03/18 1824)  gadobutrol (GADAVIST) 1 MMOL/ML injection 7 mL (7 mLs Intravenous Contrast Given 11/03/18 2249)     Initial Impression / Assessment and Plan / ED Course  I have reviewed the triage vital signs and the nursing notes.  Pertinent labs & imaging results that were available during my care of the patient were reviewed by me and considered in my medical decision making (see chart for details).     Patient is a 77 year old female with past medical history significant for dizziness recent diagnosis of  vertigo, hypertension hyperlipidemia who presents for sudden onset dizziness that started about 1230, unresolved with p.o. meclizine at home.  Patient states that she still feels mildly nauseous despite additional dose of meclizine here and Zofran.  EKG was reviewed which showed normal sinus rhythm no ST segment elevations or T wave inversions.  Initial troponin negative.  Labs unremarkable. Due to concern of cerebellar stroke and symptoms not completely resolved, MRI brain with and without contrast will be obtained.  If imaging is negative for acute stroke patient will be discharged with likely diagnosis of peripheral vertigo and counseled to follow-up with primary care doctor for further evaluation and management.  Final Clinical Impressions(s) / ED Diagnoses   Final diagnoses:  Dizziness  Vertigo    ED Discharge Orders         Ordered    meclizine (ANTIVERT) 25 MG tablet  3 times daily PRN     11/04/18 0006           Erskine Squibb, MD 11/04/18 0006    Blanchie Dessert, MD 11/04/18 0009

## 2018-11-03 NOTE — ED Notes (Signed)
Pt here for dizziness, per providers, needs eval in ER. Pt refused wheelchair, walked with patient to the ER.

## 2018-11-03 NOTE — ED Notes (Signed)
Purewick placed on pt. 

## 2018-11-03 NOTE — ED Notes (Signed)
Pt stated she was hungry and EMT advised her we would need the MRI before pt eats.

## 2018-11-03 NOTE — ED Triage Notes (Addendum)
Pt reports dizziness x2 days.   Reports the room is spinning. Activily vomiting during triage

## 2018-11-04 DIAGNOSIS — R42 Dizziness and giddiness: Secondary | ICD-10-CM | POA: Diagnosis not present

## 2018-11-04 MED ORDER — MECLIZINE HCL 25 MG PO TABS
25.0000 mg | ORAL_TABLET | Freq: Once | ORAL | Status: AC
  Administered 2018-11-04: 25 mg via ORAL
  Filled 2018-11-04: qty 1

## 2018-11-04 MED ORDER — MECLIZINE HCL 25 MG PO TABS: 25.0000 mg | ORAL_TABLET | Freq: Three times a day (TID) | ORAL | 0 refills | Status: DC | PRN

## 2018-11-19 ENCOUNTER — Other Ambulatory Visit: Payer: Self-pay | Admitting: Nurse Practitioner

## 2018-11-30 ENCOUNTER — Telehealth: Payer: Self-pay

## 2018-11-30 ENCOUNTER — Ambulatory Visit (HOSPITAL_COMMUNITY)
Admission: EM | Admit: 2018-11-30 | Discharge: 2018-11-30 | Disposition: A | Discharge status: Home / Self Care | Payer: Medicare Other | Attending: Family Medicine | Admitting: Family Medicine

## 2018-11-30 ENCOUNTER — Encounter (HOSPITAL_COMMUNITY): Payer: Self-pay | Admitting: Emergency Medicine

## 2018-11-30 DIAGNOSIS — I10 Essential (primary) hypertension: Secondary | ICD-10-CM | POA: Insufficient documentation

## 2018-11-30 MED ORDER — OLMESARTAN MEDOXOMIL 20 MG PO TABS: 20.0000 mg | ORAL_TABLET | Freq: Every day | ORAL | 2 refills | Status: DC

## 2018-11-30 MED ORDER — OLMESARTAN MEDOXOMIL 20 MG PO TABS: 20.0000 mg | ORAL_TABLET | Freq: Every day | ORAL | 1 refills | Status: DC

## 2018-11-30 NOTE — Telephone Encounter (Signed)
Patient notified that her refill for blood pressure medication has been faxed to the pharmacy.

## 2018-11-30 NOTE — ED Provider Notes (Signed)
[facility]   [id number] 11/30/18 Arrival Time: 9381  ASSESSMENT & PLAN:  1. Uncontrolled hypertension    Refill given as requested.  Meds ordered this encounter  Medications  . olmesartan (BENICAR) 20 MG tablet    Sig: Take 1 tablet (20 mg total) by mouth daily.    Dispense:  30 tablet    Refill:  1   She plans f/u with her PCP. May f/u here as needed.  Reviewed expectations re: course of current medical issues. Questions answered. Outlined signs and symptoms indicating need for more acute intervention. Patient verbalized understanding. After Visit Summary given.   SUBJECTIVE: History from: patient. Allison Franco is a 78 y.o. female who presents requesting medication refill. HTN medication. No current concerns otherwise. Reports her blood pressure is semi-controlled at baseline. No headaches or visual changes. No LE edema. No CP reported.  Current medical problems include: Past Medical History:  Diagnosis Date  . Anxiety   . Constipation   . Depression    "following husband's death"  . Diabetes mellitus without complication ([place])   . Hypertension   . Tinnitus     No current facility-administered medications for this encounter.   Current Outpatient Medications:  .  acetaminophen (TYLENOL) 500 MG tablet, Take 1,000 mg by mouth every 6 (six) hours as needed for mild pain, moderate pain or headache. Reported on 05/10/2016, Disp: , Rfl:  .  Alcohol Swabs (ALCOHOL WIPES) 70 % PADS, by Does not apply route. Use with blood sugar check and injection of insulin, Disp: , Rfl:  .  blood glucose meter kit and supplies KIT, Dispense based on patient and insurance preference. Use up to four times daily as directed. (FOR ICD-9 250.00, 250.01)., Disp: 1 each, Rfl: 0 .  Calcium 600-200 MG-UNIT tablet, Take 1 tablet by mouth daily., Disp: , Rfl:  .  Cholecalciferol (VITAMIN D) 50 MCG (2000 UT) tablet, Take 2,000 Units by mouth daily. , Disp: , Rfl:  .  clobetasol ointment  (TEMOVATE) 0.17 %, Apply 1 application topically 2 (two) times daily. (Patient not taking: Reported on 08/12/2018), Disp: 30 g, Rfl: 0 .  Continuous Blood Gluc Receiver (FREESTYLE LIBRE 14 DAY READER) DEVI, by Does not apply route. Check blood sugar 4 times per day Dx E11.65, Disp: , Rfl:  .  Continuous Blood Gluc Sensor (FREESTYLE LIBRE 14 DAY SENSOR) MISC, by Does not apply route. Check blood sugars 4 times per day. E11.65, Disp: , Rfl:  .  diphenhydrAMINE (BENADRYL) 25 mg capsule, Take 1 capsule (25 mg total) by mouth at bedtime as needed for allergies. (Patient not taking: Reported on 10/04/2018), Disp: 15 capsule, Rfl: 0 .  dorzolamide-timolol (COSOPT) 22.3-6.8 MG/ML ophthalmic solution, Place 1 drop into both eyes 2 (two) times daily., Disp: , Rfl: 5 .  EPINEPHrine (EPIPEN 2-PAK) 0.3 mg/0.3 mL IJ SOAJ injection, Inject 0.3 mLs (0.3 mg total) into the muscle once as needed (for severe allergic reaction). CAll 911 immediately if you have to use this medicine (Patient not taking: Reported on 11/03/2018), Disp: 1 Device, Rfl: 1 .  fluticasone (FLONASE) 50 MCG/ACT nasal spray, Place 2 sprays into both nostrils daily. (Patient not taking: Reported on 10/04/2018), Disp: 1 g, Rfl: 0 .  glucose blood (ACCU-CHEK GUIDE) test strip, 1 each by Other route as needed for other (check blood sugars by intradermal route 2 times per day. E11.65). Use as instructed, Disp: , Rfl:  .  insulin detemir (LEVEMIR) 100 UNIT/ML injection, Inject 0.1 mLs (10 Units  total) into the skin 2 (two) times daily. (Patient not taking: Reported on 11/03/2018), Disp: 10 mL, Rfl: 0 .  Insulin Pen Needle (PEN NEEDLES 31GX5/16") 31G X 8 MM MISC, by Does not apply route. Use as directed with insulin pen, Disp: , Rfl:  .  Lancets Misc. (ACCU-CHEK FASTCLIX LANCET) KIT, by Does not apply route. Stick by [employer] (Non-Drug Combo Route) check blood sugar before breakfast and dinner. Dx E11.65, Disp: , Rfl:  .  meclizine (ANTIVERT) 25 MG tablet, Take 1  tablet (25 mg total) by mouth 3 (three) times daily as needed for dizziness., Disp: 30 tablet, Rfl: 0 .  metFORMIN (GLUCOPHAGE) 500 MG tablet, TAKE 1 TABLET BY MOUTH 2 TIMES EVERY DAY WITH MORNING AND EVENING MEALS, Disp: 90 tablet, Rfl: 1 .  Multiple Vitamin (MULTIVITAMIN WITH MINERALS) TABS tablet, Take 1 tablet by mouth daily. Centrum, Disp: , Rfl:  .  olmesartan (BENICAR) 20 MG tablet, Take 1 tablet (20 mg total) by mouth daily., Disp: 30 tablet, Rfl: 1 .  pantoprazole (PROTONIX) 40 MG tablet, Take 1 tablet (40 mg total) by mouth daily. (Patient not taking: Reported on 11/03/2018), Disp: 30 tablet, Rfl: 0  ROS: As per HPI.   OBJECTIVE:  Vitals:   11/30/18 1346  BP: (!) 154/81  Pulse: 98  Resp: 18  Temp: 98.4 F (36.9 C)  TempSrc: Oral  SpO2: 99%    General appearance: alert; no distress Eyes: PERRLA; EOMI; conjunctiva normal Lungs: clear to auscultation bilaterally Heart: regular rate and rhythm Abdomen: soft, non-tender Extremities: no edema Skin: warm and dry Psychological: alert and cooperative; normal mood and affect  Labs reviewed: Results for orders placed or performed during the hospital encounter of 11/03/18  Lipase, blood  Result Value Ref Range   Lipase 35 11 - 51 U/L  Comprehensive metabolic panel  Result Value Ref Range   Sodium 136 135 - 145 mmol/L   Potassium 3.9 3.5 - 5.1 mmol/L   Chloride 99 98 - 111 mmol/L   CO2 26 22 - 32 mmol/L   Glucose, Bld 162 (H) 70 - 99 mg/dL   BUN 16 8 - 23 mg/dL   Creatinine, Ser 0.83 0.44 - 1.00 mg/dL   Calcium 9.3 8.9 - 10.3 mg/dL   Total Protein 7.4 6.5 - 8.1 g/dL   Albumin 3.9 3.5 - 5.0 g/dL   AST 25 15 - 41 U/L   ALT 22 0 - 44 U/L   Alkaline Phosphatase 87 38 - 126 U/L   Total Bilirubin 1.0 0.3 - 1.2 mg/dL   GFR calc non Af Amer >60 >60 mL/min   GFR calc Af Amer >60 >60 mL/min   Anion gap 11 5 - 15  CBC  Result Value Ref Range   WBC 8.8 4.0 - 10.5 K/uL   RBC 5.18 (H) 3.87 - 5.11 MIL/uL   Hemoglobin 13.8 12.0  - 15.0 g/dL   HCT 46.7 (H) 36.0 - 46.0 %   MCV 90.2 80.0 - 100.0 fL   MCH 26.6 26.0 - 34.0 pg   MCHC 29.6 (L) 30.0 - 36.0 g/dL   RDW 12.8 11.5 - 15.5 %   Platelets 280 150 - 400 K/uL   nRBC 0.0 0.0 - 0.2 %  Urinalysis, Routine w reflex microscopic  Result Value Ref Range   Color, Urine YELLOW YELLOW   APPearance CLOUDY (A) CLEAR   Specific Gravity, Urine 1.015 1.005 - 1.030   pH 9.0 (H) 5.0 - 8.0   Glucose, UA NEGATIVE  NEGATIVE mg/dL   Hgb urine dipstick NEGATIVE NEGATIVE   Bilirubin Urine NEGATIVE NEGATIVE   Ketones, ur NEGATIVE NEGATIVE mg/dL   Protein, ur NEGATIVE NEGATIVE mg/dL   Nitrite NEGATIVE NEGATIVE   Leukocytes, UA NEGATIVE NEGATIVE  CBG monitoring, ED  Result Value Ref Range   Glucose-Capillary 153 (H) 70 - 99 mg/dL  I-stat troponin, ED  Result Value Ref Range   Troponin i, poc 0.00 0.00 - 0.08 ng/mL   Comment 3           Labs Reviewed - No data to display  Allergies  Allergen Reactions  . Aspirin Other (See Comments)    insomnia  . Hydroxyzine Other (See Comments)    insomnia  . Valium [Diazepam] Other (See Comments)    Insomnia, 05/10/16 pt states she took as young person- made her depressed    Social History   Socioeconomic History  . Marital status: Widowed    Spouse name: Not on file  . Number of children: 0  . Years of education: 40  . Highest education level: Not on file  Occupational History  . Not on file  Social Needs  . Financial resource strain: Not hard at all  . Food insecurity:    Worry: Never true    Inability: Never true  . Transportation needs:    Medical: No    Non-medical: No  Tobacco Use  . Smoking status: Never Smoker  . Smokeless tobacco: Never Used  Substance and Sexual Activity  . Alcohol use: No  . Drug use: No  . Sexual activity: Never  Lifestyle  . Physical activity:    Days per week: 0 days    Minutes per session: 0 min  . Stress: Not at all  Relationships  . Social connections:    Talks on phone: Not on  file    Gets together: Not on file    Attends religious service: Not on file    Active member of club or organization: Not on file    Attends meetings of clubs or organizations: Not on file    Relationship status: Not on file  . Intimate partner violence:    Fear of current or ex partner: No    Emotionally abused: No    Physically abused: No    Forced sexual activity: No  Other Topics Concern  . Not on file  Social History Narrative   Lives alone, husband passed away 2 1/2 years ago   Caffeine use- none   Family History  Problem Relation Age of Onset  . Diabetes Mother   . Hypertension Father    Past Surgical History:  Procedure Laterality Date  . ABDOMINAL HYSTERECTOMY    . fribroid surgery       Vanessa Kick, MD 11/30/18 1438

## 2018-11-30 NOTE — ED Notes (Signed)
Patient able to ambulate independently  

## 2018-11-30 NOTE — ED Triage Notes (Signed)
Pt states she is having issues getting her BP medication refilled.  States her PCP states they would call it in multiple times but it never gets filled at the pharmacy.

## 2018-12-01 ENCOUNTER — Encounter (HOSPITAL_COMMUNITY): Payer: Self-pay

## 2018-12-01 ENCOUNTER — Emergency Department (HOSPITAL_COMMUNITY)
Admission: EM | Admit: 2018-12-01 | Discharge: 2018-12-01 | Disposition: A | Discharge status: Home / Self Care | Payer: Medicare Other | Attending: Emergency Medicine | Admitting: Emergency Medicine

## 2018-12-01 DIAGNOSIS — H9311 Tinnitus, right ear: Secondary | ICD-10-CM | POA: Insufficient documentation

## 2018-12-01 DIAGNOSIS — I10 Essential (primary) hypertension: Secondary | ICD-10-CM | POA: Diagnosis not present

## 2018-12-01 DIAGNOSIS — R51 Headache: Secondary | ICD-10-CM

## 2018-12-01 DIAGNOSIS — Z79899 Other long term (current) drug therapy: Secondary | ICD-10-CM | POA: Insufficient documentation

## 2018-12-01 DIAGNOSIS — Z794 Long term (current) use of insulin: Secondary | ICD-10-CM | POA: Diagnosis not present

## 2018-12-01 DIAGNOSIS — G4489 Other headache syndrome: Secondary | ICD-10-CM | POA: Diagnosis not present

## 2018-12-01 DIAGNOSIS — R52 Pain, unspecified: Secondary | ICD-10-CM | POA: Diagnosis not present

## 2018-12-01 DIAGNOSIS — R519 Headache, unspecified: Secondary | ICD-10-CM

## 2018-12-01 DIAGNOSIS — R069 Unspecified abnormalities of breathing: Secondary | ICD-10-CM | POA: Diagnosis not present

## 2018-12-01 DIAGNOSIS — R0602 Shortness of breath: Secondary | ICD-10-CM | POA: Diagnosis not present

## 2018-12-01 DIAGNOSIS — E119 Type 2 diabetes mellitus without complications: Secondary | ICD-10-CM | POA: Diagnosis not present

## 2018-12-01 MED ORDER — DIPHENHYDRAMINE HCL 50 MG/ML IJ SOLN
12.5000 mg | Freq: Once | INTRAMUSCULAR | Status: AC
  Administered 2018-12-01: 12.5 mg via INTRAVENOUS
  Filled 2018-12-01: qty 1

## 2018-12-01 MED ORDER — METOCLOPRAMIDE HCL 5 MG/ML IJ SOLN
10.0000 mg | Freq: Once | INTRAMUSCULAR | Status: AC
  Administered 2018-12-01: 10 mg via INTRAVENOUS
  Filled 2018-12-01: qty 2

## 2018-12-01 MED ORDER — SODIUM CHLORIDE 0.9 % IV BOLUS
1000.0000 mL | Freq: Once | INTRAVENOUS | Status: AC
  Administered 2018-12-01: 1000 mL via INTRAVENOUS

## 2018-12-01 NOTE — ED Provider Notes (Signed)
[facility] EMERGENCY DEPARTMENT Provider Note   CSN: [id number] Arrival date & time: 12/01/18  0156     History   Chief Complaint Chief Complaint  Patient presents with  . Tinnitus  . Anxiety    HPI Allison Franco is a 78 y.o. female.  The history is provided by the patient.  She has history of diabetes, hypertension, tinnitus and comes in because of headache and tinnitus.  She states that she was awakened by a sharp pain in the left occipital area and noticed worsening of her chronic tinnitus in the right ear.  Headache has subsided slightly but is still rated at 7/10.  She describes it as a dull pain currently.  Nothing makes it better, nothing makes it worse.  There is no associated visual change, nausea, vomiting.  She checked her blood pressure at home and noted that it was very high-182/100.  She had just been seen yesterday at urgent care to get a refill of her hypertension medication.  Past Medical History:  Diagnosis Date  . Anxiety   . Constipation   . Depression    "following husband's death"  . Diabetes mellitus without complication ([place])   . Hypertension   . Tinnitus     Patient Active Problem List   Diagnosis Date Noted  . Anemia 08/31/2018  . Controlled type 2 diabetes mellitus with hyperglycemia ([place]) 08/31/2018  . Other headache syndrome 07/03/2018  . Essential hypertension 07/03/2018  . Uncontrolled diabetes mellitus type 2 without complications ([place]) 14/43/1540  . Grief reaction 10/20/2013  . Hyponatremia 10/20/2013  . Obesity 10/20/2013  . Atypical chest pain 09/01/2013  . ALLERGIC RHINITIS 08/11/2009  . KNEE PAIN, BILATERAL 02/13/2009  . COUGH 02/13/2009  . INSOMNIA 07/17/2008  . TIREDNESS 04/16/2008  . HYPERTENSION 01/24/2008  . BACK PAIN, LUMBAR 01/22/2008  . HYPERGLYCEMIA, BORDERLINE 01/27/2007    Past Surgical History:  Procedure Laterality Date  . ABDOMINAL HYSTERECTOMY    . fribroid surgery       OB History   No  obstetric history on file.      Home Medications    Prior to Admission medications   Medication Sig Start Date End Date Taking? Authorizing Provider  acetaminophen (TYLENOL) 500 MG tablet Take 1,000 mg by mouth every 6 (six) hours as needed for mild pain, moderate pain or headache. Reported on 05/10/2016    [provider]  Alcohol Swabs (ALCOHOL WIPES) 70 % PADS by Does not apply route. Use with blood sugar check and injection of insulin    [provider]  blood glucose meter kit and supplies KIT Dispense based on patient and insurance preference. Use up to four times daily as directed. (FOR ICD-9 250.00, 250.01). 07/05/18   Florencia Reasons, MD  Calcium 600-200 MG-UNIT tablet Take 1 tablet by mouth daily.    [provider]  Cholecalciferol (VITAMIN D) 50 MCG (2000 UT) tablet Take 2,000 Units by mouth daily.     [provider]  clobetasol ointment (TEMOVATE) 0.86 % Apply 1 application topically 2 (two) times daily. Patient not taking: Reported on 08/12/2018 06/14/18   Raylene Everts, MD  Continuous Blood Gluc Receiver (FREESTYLE LIBRE 14 DAY READER) DEVI by Does not apply route. Check blood sugar 4 times per day Dx E11.65    [provider]  Continuous Blood Gluc Sensor (FREESTYLE LIBRE 14 DAY SENSOR) MISC by Does not apply route. Check blood sugars 4 times per day. E11.65    [provider]  diphenhydrAMINE (BENADRYL) 25 mg capsule Take 1 capsule (25 mg total) by mouth at bedtime as needed for allergies. Patient not taking: Reported on 10/04/2018 12/01/17   Ok Edwards, PA-C  dorzolamide-timolol (COSOPT) 22.3-6.8 MG/ML ophthalmic solution Place 1 drop into both eyes 2 (two) times daily. 08/02/18   [provider]  EPINEPHrine (EPIPEN 2-PAK) 0.3 mg/0.3 mL IJ SOAJ injection Inject 0.3 mLs (0.3 mg total) into the muscle once as needed (for severe allergic reaction). CAll 911 immediately if you have to use this medicine Patient not taking:  Reported on 11/03/2018 09/10/17   Larene Pickett, PA-C  fluticasone [facility]) 50 MCG/ACT nasal spray Place 2 sprays into both nostrils daily. Patient not taking: Reported on 10/04/2018 01/05/18   Ok Edwards, PA-C  glucose blood (ACCU-CHEK GUIDE) test strip 1 each by Other route as needed for other (check blood sugars by intradermal route 2 times per day. E11.65). Use as instructed    [provider]  insulin detemir (LEVEMIR) 100 UNIT/ML injection Inject 0.1 mLs (10 Units total) into the skin 2 (two) times daily. Patient not taking: Reported on 11/03/2018 07/05/18   Florencia Reasons, MD  Insulin Pen Needle (PEN NEEDLES 31GX5/16") 31G X 8 MM MISC by Does not apply route. Use as directed with insulin pen    [provider]  Lancets Misc. (ACCU-CHEK FASTCLIX LANCET) KIT by Does not apply route. Stick by [employer] (Non-Drug Combo Route) check blood sugar before breakfast and dinner. Dx E11.65    [provider]  meclizine (ANTIVERT) 25 MG tablet Take 1 tablet (25 mg total) by mouth 3 (three) times daily as needed for dizziness. 11/04/18   Erskine Squibb, MD  metFORMIN (GLUCOPHAGE) 500 MG tablet TAKE 1 TABLET BY MOUTH 2 TIMES EVERY DAY WITH MORNING AND EVENING MEALS 11/19/18   [name], FNP  Multiple Vitamin (MULTIVITAMIN WITH MINERALS) TABS tablet Take 1 tablet by mouth daily. Centrum    [provider]  olmesartan (BENICAR) 20 MG tablet Take 1 tablet (20 mg total) by mouth daily. 11/30/18   Vanessa Kick, MD  pantoprazole (PROTONIX) 40 MG tablet Take 1 tablet (40 mg total) by mouth daily. Patient not taking: Reported on 32/07/5187 03/14/59   Delora Fuel, MD    Family History Family History  Problem Relation Age of Onset  . Diabetes Mother   . Hypertension Father     Social History Social History   Tobacco Use  . Smoking status: Never Smoker  . Smokeless tobacco: Never Used  Substance Use Topics  . Alcohol use: No  . Drug use: No     Allergies   Aspirin; Hydroxyzine;  and Valium [diazepam]   Review of Systems Review of Systems  All other systems reviewed and are negative.    Physical Exam Updated Vital Signs BP 136/82 (BP Location: Right Arm)   Pulse 83   Temp 98 F (36.7 C) (Oral)   Resp 19   SpO2 98%   Physical Exam Vitals signs and nursing note reviewed.    78 year old female, resting comfortably and in no acute distress. Vital signs are normal. Oxygen saturation is 98%, which is normal. Head is normocephalic and atraumatic. PERRLA, EOMI. Oropharynx is clear.  Tympanic membranes are clear. Neck is nontender and supple without adenopathy or JVD. Back is nontender and there is no CVA tenderness. Lungs are clear without rales, wheezes, or rhonchi. Chest is nontender. Heart has regular rate and rhythm without murmur. Abdomen is  soft, flat, nontender without masses or hepatosplenomegaly and peristalsis is normoactive. Extremities have no cyanosis or edema, full range of motion is present. Skin is warm and dry without rash. Neurologic: Mental status is normal, cranial nerves are intact, there are no motor or sensory deficits.  ED Treatments / Results   Procedures Procedures   Medications Ordered in ED Medications - No data to display   Initial Impression / Assessment and Plan / ED Course  I have reviewed the triage vital signs and the nursing notes.  Headache, nonspecific.  Worsening of chronic tinnitus.  Old records are reviewed, and she has several ED visits for tinnitus, has had 2 negative MRI scans of the brain in the last 6 months.  She has been referred to ENT in the past, but apparently has not followed up.  At this point, I do not feel she needs any further advanced imaging.  She will be given a headache cocktail of normal saline, metoclopramide, diphenhydramine and reassessed.  Headache has resolved with above-noted treatment, and tinnitus has also lessened.  Patient is discharged with instructions to follow-up with ENT for  evaluation of her tinnitus.  Final Clinical Impressions(s) / ED Diagnoses   Final diagnoses:  Headache, unspecified headache type  Tinnitus aurium, right    ED Discharge Orders    None       Delora Fuel, MD 95/84/41 [phone]

## 2018-12-01 NOTE — ED Triage Notes (Signed)
Pt comes via [facility] for ringing in R ear, headache, and some subjective SOB and anxiety. No distress noted

## 2018-12-14 ENCOUNTER — Ambulatory Visit (INDEPENDENT_AMBULATORY_CARE_PROVIDER_SITE_OTHER): Payer: Medicare Other

## 2018-12-14 ENCOUNTER — Encounter (HOSPITAL_COMMUNITY): Payer: Self-pay | Admitting: Emergency Medicine

## 2018-12-14 ENCOUNTER — Other Ambulatory Visit: Payer: Self-pay

## 2018-12-14 ENCOUNTER — Ambulatory Visit (HOSPITAL_COMMUNITY)
Admission: EM | Admit: 2018-12-14 | Discharge: 2018-12-14 | Disposition: A | Discharge status: Home / Self Care | Payer: Medicare Other | Attending: Internal Medicine | Admitting: Internal Medicine

## 2018-12-14 DIAGNOSIS — R109 Unspecified abdominal pain: Secondary | ICD-10-CM

## 2018-12-14 DIAGNOSIS — K5900 Constipation, unspecified: Secondary | ICD-10-CM | POA: Diagnosis not present

## 2018-12-14 MED ORDER — POLYETHYLENE GLYCOL 3350 17 G PO PACK: 17.0000 g | PACK | ORAL | 0 refills | Status: DC

## 2018-12-14 MED ORDER — LACTULOSE 10 GM/15ML PO SOLN: 10.0000 g | Freq: Every day | ORAL | 0 refills | Status: AC

## 2018-12-14 NOTE — ED Triage Notes (Signed)
PT reports abdominal pain for 3 weeks. Pain is generalized. PT feels "movement" in her anus. PT reports BMs have been more constipated than loose.   Pt was seen in ED two months ago for vertigo.

## 2019-01-04 ENCOUNTER — Emergency Department (HOSPITAL_COMMUNITY)
Admission: EM | Admit: 2019-01-04 | Discharge: 2019-01-05 | Disposition: A | Discharge status: Home / Self Care | Payer: Medicare Other | Attending: Emergency Medicine | Admitting: Emergency Medicine

## 2019-01-04 ENCOUNTER — Emergency Department (HOSPITAL_COMMUNITY): Payer: Medicare Other

## 2019-01-04 ENCOUNTER — Other Ambulatory Visit: Payer: Self-pay

## 2019-01-04 ENCOUNTER — Encounter (HOSPITAL_COMMUNITY): Payer: Self-pay | Admitting: Emergency Medicine

## 2019-01-04 DIAGNOSIS — Z794 Long term (current) use of insulin: Secondary | ICD-10-CM | POA: Diagnosis not present

## 2019-01-04 DIAGNOSIS — E119 Type 2 diabetes mellitus without complications: Secondary | ICD-10-CM | POA: Insufficient documentation

## 2019-01-04 DIAGNOSIS — R1084 Generalized abdominal pain: Secondary | ICD-10-CM | POA: Diagnosis not present

## 2019-01-04 DIAGNOSIS — Z79899 Other long term (current) drug therapy: Secondary | ICD-10-CM | POA: Insufficient documentation

## 2019-01-04 DIAGNOSIS — K4090 Unilateral inguinal hernia, without obstruction or gangrene, not specified as recurrent: Secondary | ICD-10-CM | POA: Insufficient documentation

## 2019-01-04 DIAGNOSIS — I10 Essential (primary) hypertension: Secondary | ICD-10-CM | POA: Diagnosis not present

## 2019-01-04 DIAGNOSIS — N390 Urinary tract infection, site not specified: Secondary | ICD-10-CM | POA: Insufficient documentation

## 2019-01-04 LAB — CBC
HCT: 42.1 % (ref 36.0–46.0)
Hemoglobin: 12.9 g/dL (ref 12.0–15.0)
MCH: 27.3 pg (ref 26.0–34.0)
MCHC: 30.6 g/dL (ref 30.0–36.0)
MCV: 89 fL (ref 80.0–100.0)
Platelets: 252 10*3/uL (ref 150–400)
RBC: 4.73 MIL/uL (ref 3.87–5.11)
RDW: 12.2 % (ref 11.5–15.5)
WBC: 8.1 10*3/uL (ref 4.0–10.5)
nRBC: 0 % (ref 0.0–0.2)

## 2019-01-04 LAB — COMPREHENSIVE METABOLIC PANEL
ALT: 21 U/L (ref 0–44)
AST: 23 U/L (ref 15–41)
Albumin: 3.5 g/dL (ref 3.5–5.0)
Alkaline Phosphatase: 87 U/L (ref 38–126)
Anion gap: 13 (ref 5–15)
BUN: 12 mg/dL (ref 8–23)
CO2: 26 mmol/L (ref 22–32)
Calcium: 9 mg/dL (ref 8.9–10.3)
Chloride: 97 mmol/L — ABNORMAL LOW (ref 98–111)
Creatinine, Ser: 0.82 mg/dL (ref 0.44–1.00)
GFR calc Af Amer: >60 mL/min (ref >60)
GFR calc non Af Amer: >60 mL/min (ref >60)
Glucose, Bld: 180 mg/dL — ABNORMAL HIGH (ref 70–99)
Potassium: 3.9 mmol/L (ref 3.5–5.1)
Sodium: 136 mmol/L (ref 135–145)
Total Bilirubin: 0.4 mg/dL (ref 0.3–1.2)
Total Protein: 7 g/dL (ref 6.5–8.1)

## 2019-01-04 LAB — I-STAT TROPONIN, ED: Troponin i, poc: 0.01 ng/mL (ref 0.00–0.08)

## 2019-01-04 LAB — LIPASE, BLOOD: Lipase: 31 U/L (ref 11–51)

## 2019-01-04 LAB — URINALYSIS, ROUTINE W REFLEX MICROSCOPIC
Bilirubin Urine: NEGATIVE
Glucose, UA: NEGATIVE mg/dL
Hgb urine dipstick: NEGATIVE
Ketones, ur: NEGATIVE mg/dL
Nitrite: NEGATIVE
Protein, ur: NEGATIVE mg/dL
Specific Gravity, Urine: 1.014 (ref 1.005–1.030)
pH: 8 (ref 5.0–8.0)

## 2019-01-04 MED ORDER — SODIUM CHLORIDE 0.9% FLUSH: 3.0000 mL | Freq: Once | INTRAVENOUS | Status: DC

## 2019-01-04 MED ORDER — DICYCLOMINE HCL 20 MG PO TABS: 20.0000 mg | ORAL_TABLET | Freq: Two times a day (BID) | ORAL | 0 refills | Status: DC | PRN

## 2019-01-04 MED ORDER — CEPHALEXIN 500 MG PO CAPS: 500.0000 mg | ORAL_CAPSULE | Freq: Three times a day (TID) | ORAL | 0 refills | Status: DC

## 2019-01-04 MED ORDER — IOHEXOL 300 MG/ML  SOLN
100.0000 mL | Freq: Once | INTRAMUSCULAR | Status: AC | PRN
  Administered 2019-01-04: 100 mL via INTRAVENOUS

## 2019-01-04 NOTE — ED Notes (Signed)
Updated on wait for treatment room. 

## 2019-01-04 NOTE — ED Notes (Signed)
Patient transported to CT 

## 2019-01-04 NOTE — Discharge Instructions (Addendum)
You may take Tylenol 1000 mg every 6 hours as needed for pain. °

## 2019-01-04 NOTE — ED Provider Notes (Signed)
[facility] EMERGENCY DEPARTMENT Provider Note   CSN: [id number] Arrival date & time: 01/04/19  1936     History   Chief Complaint Chief Complaint  Patient presents with  . Abdominal Pain    HPI Allison Franco is a 78 y.o. female.  HPI Patient presents with generalized abdominal pain which is episodic.  Describes the pain is sharp in nature.  States the pain was in the groin region prior to presentation.  Pain is now completely resolved.  Endorses constipation and some difficulty passing stool.  Denies nausea vomiting.  No fever or chills.  No blood in the stool.  Patient does have some urinary frequency.  Past Medical History:  Diagnosis Date  . Anxiety   . Constipation   . Depression    "following husband's death"  . Diabetes mellitus without complication ([place])   . Hypertension   . Tinnitus     Patient Active Problem List   Diagnosis Date Noted  . Anemia 08/31/2018  . Controlled type 2 diabetes mellitus with hyperglycemia ([place]) 08/31/2018  . Other headache syndrome 07/03/2018  . Essential hypertension 07/03/2018  . Uncontrolled diabetes mellitus type 2 without complications ([place]) 00/92/3300  . Grief reaction 10/20/2013  . Hyponatremia 10/20/2013  . Obesity 10/20/2013  . Atypical chest pain 09/01/2013  . ALLERGIC RHINITIS 08/11/2009  . KNEE PAIN, BILATERAL 02/13/2009  . COUGH 02/13/2009  . INSOMNIA 07/17/2008  . TIREDNESS 04/16/2008  . HYPERTENSION 01/24/2008  . BACK PAIN, LUMBAR 01/22/2008  . HYPERGLYCEMIA, BORDERLINE 01/27/2007    Past Surgical History:  Procedure Laterality Date  . ABDOMINAL HYSTERECTOMY    . fribroid surgery       OB History   No obstetric history on file.      Home Medications    Prior to Admission medications   Medication Sig Start Date End Date Taking? Authorizing Provider  acetaminophen (TYLENOL) 500 MG tablet Take 1,000 mg by mouth every 6 (six) hours as needed for mild pain, moderate pain or headache.  Reported on 05/10/2016    [provider]  Alcohol Swabs (ALCOHOL WIPES) 70 % PADS by Does not apply route. Use with blood sugar check and injection of insulin    [provider]  blood glucose meter kit and supplies KIT Dispense based on patient and insurance preference. Use up to four times daily as directed. (FOR ICD-9 250.00, 250.01). 07/05/18   Florencia Reasons, MD  Calcium 600-200 MG-UNIT tablet Take 1 tablet by mouth daily.    [provider]  cephALEXin (KEFLEX) 500 MG capsule Take 1 capsule (500 mg total) by mouth 3 (three) times daily. 01/04/19   Julianne Rice, MD  Cholecalciferol (VITAMIN D) 50 MCG (2000 UT) tablet Take 2,000 Units by mouth daily.     [provider]  Continuous Blood Gluc Receiver (FREESTYLE LIBRE 14 DAY READER) DEVI by Does not apply route. Check blood sugar 4 times per day Dx E11.65    [provider]  Continuous Blood Gluc Sensor (FREESTYLE LIBRE 14 DAY SENSOR) MISC by Does not apply route. Check blood sugars 4 times per day. E11.65    [provider]  dicyclomine (BENTYL) 20 MG tablet Take 1 tablet (20 mg total) by mouth 2 (two) times daily as needed for spasms. 01/04/19   Julianne Rice, MD  dorzolamide-timolol (COSOPT) 22.3-6.8 MG/ML ophthalmic solution Place 1 drop into both eyes 2 (two) times daily. 08/02/18   [provider]  EPINEPHrine (EPIPEN 2-PAK) 0.3 mg/0.3 mL IJ  SOAJ injection Inject 0.3 mLs (0.3 mg total) into the muscle once as needed (for severe allergic reaction). CAll 911 immediately if you have to use this medicine Patient not taking: Reported on 11/03/2018 09/10/17   Larene Pickett, PA-C  glucose blood (ACCU-CHEK GUIDE) test strip 1 each by Other route as needed for other (check blood sugars by intradermal route 2 times per day. E11.65). Use as instructed    [provider]  Insulin Pen Needle (PEN NEEDLES 31GX5/16") 31G X 8 MM MISC by Does not apply route. Use as directed with insulin pen     [provider]  Lancets Misc. (ACCU-CHEK FASTCLIX LANCET) KIT by Does not apply route. Stick by [employer] (Non-Drug Combo Route) check blood sugar before breakfast and dinner. Dx E11.65    [provider]  meclizine (ANTIVERT) 25 MG tablet Take 1 tablet (25 mg total) by mouth 3 (three) times daily as needed for dizziness. 11/04/18   Erskine Squibb, MD  metFORMIN (GLUCOPHAGE) 500 MG tablet TAKE 1 TABLET BY MOUTH 2 TIMES EVERY DAY WITH MORNING AND EVENING MEALS 11/19/18   [name], FNP  Multiple Vitamin (MULTIVITAMIN WITH MINERALS) TABS tablet Take 1 tablet by mouth daily. Centrum    [provider]  olmesartan (BENICAR) 20 MG tablet Take 1 tablet (20 mg total) by mouth daily. 11/30/18   Vanessa Kick, MD  polyethylene glycol (MIRALAX / GLYCOLAX) packet Take 17 g by mouth every other day. 12/14/18   Harrie Foreman, MD    Family History Family History  Problem Relation Age of Onset  . Diabetes Mother   . Hypertension Father     Social History Social History   Tobacco Use  . Smoking status: Never Smoker  . Smokeless tobacco: Never Used  Substance Use Topics  . Alcohol use: No  . Drug use: No     Allergies   Aspirin; Hydroxyzine; and Valium [diazepam]   Review of Systems Review of Systems  Constitutional: Negative for chills and fever.  HENT: Negative for sore throat.   Eyes: Negative for visual disturbance.  Respiratory: Negative for cough and shortness of breath.   Cardiovascular: Negative for chest pain.  Gastrointestinal: Positive for abdominal distention, abdominal pain and constipation. Negative for diarrhea, nausea and vomiting.  Genitourinary: Positive for frequency. Negative for dysuria and flank pain.  Musculoskeletal: Negative for arthralgias, myalgias and neck pain.  Skin: Negative for rash and wound.  Neurological: Negative for dizziness, weakness, light-headedness, numbness and headaches.  All other systems reviewed and are  negative.    Physical Exam Updated Vital Signs BP 137/72 (BP Location: Right Arm)   Pulse 63   Temp 97.6 F (36.4 C) (Oral)   Resp 19   SpO2 100%   Physical Exam Vitals signs and nursing note reviewed.  Constitutional:      General: She is not in acute distress.    Appearance: Normal appearance. She is well-developed. She is not ill-appearing.  HENT:     Head: Normocephalic and atraumatic.     Mouth/Throat:     Mouth: Mucous membranes are moist.     Pharynx: No oropharyngeal exudate.  Eyes:     Pupils: Pupils are equal, round, and reactive to light.  Neck:     Musculoskeletal: Normal range of motion and neck supple.  Cardiovascular:     Rate and Rhythm: Normal rate and regular rhythm.  Pulmonary:     Effort: Pulmonary effort is normal.     Breath sounds:  Normal breath sounds.  Abdominal:     General: Bowel sounds are normal. There is no distension.     Palpations: Abdomen is soft.     Tenderness: There is no abdominal tenderness. There is no right CVA tenderness, left CVA tenderness, guarding or rebound.  Musculoskeletal: Normal range of motion.        General: No swelling, tenderness, deformity or signs of injury.     Right lower leg: No edema.     Left lower leg: No edema.  Skin:    General: Skin is warm and dry.     Capillary Refill: Capillary refill takes less than 2 seconds.     Findings: No erythema or rash.  Neurological:     General: No focal deficit present.     Mental Status: She is alert and oriented to person, place, and time.  Psychiatric:        Mood and Affect: Mood normal.        Behavior: Behavior normal.      ED Treatments / Results  Labs (all labs ordered are listed, but only abnormal results are displayed) Labs Reviewed  COMPREHENSIVE METABOLIC PANEL - Abnormal; Notable for the following components:      Result Value   Chloride 97 (*)    Glucose, Bld 180 (*)    All other components within normal limits  URINALYSIS, ROUTINE W REFLEX  MICROSCOPIC - Abnormal; Notable for the following components:   Leukocytes, UA MODERATE (*)    Bacteria, UA RARE (*)    All other components within normal limits  URINE CULTURE  LIPASE, BLOOD  CBC  I-STAT TROPONIN, ED    EKG EKG Interpretation  Date/Time:  Friday January 04 2019 19:42:50 EST Ventricular Rate:  79 PR Interval:  204 QRS Duration: 78 QT Interval:  356 QTC Calculation: 408 R Axis:   -28 Text Interpretation:  Normal sinus rhythm Biatrial enlargement Anterior infarct , age undetermined Abnormal ECG No significant change since last tracing Confirmed by Pryor Curia [phone]) on 01/05/2019 12:14:23 AM Also confirmed by Ward, Cyril Mourning [phone]), editor Lewanda Rife, Janett Billow [phone])  on 01/05/2019 8:34:39 AM   Radiology Ct Abdomen Pelvis W Contrast  Result Date: 01/05/2019 CLINICAL DATA:  Generalized abdominal pain EXAM: CT ABDOMEN AND PELVIS WITH CONTRAST TECHNIQUE: Multidetector CT imaging of the abdomen and pelvis was performed using the standard protocol following bolus administration of intravenous contrast. CONTRAST:  147m OMNIPAQUE IOHEXOL 300 MG/ML  SOLN COMPARISON:  06/01/2018 FINDINGS: Lower chest: Lung bases are clear. No effusions. Heart is normal size. Hepatobiliary: No focal hepatic abnormality. Gallbladder unremarkable. Pancreas: No focal abnormality or ductal dilatation. Spleen: No focal abnormality.  Normal size. Adrenals/Urinary Tract: No adrenal abnormality. No focal renal abnormality. No stones or hydronephrosis. Urinary bladder is unremarkable. Stomach/Bowel: Stomach, large and small bowel grossly unremarkable. Normal appendix. Vascular/Lymphatic: No evidence of aneurysm or adenopathy. Reproductive: Prior hysterectomy.  No adnexal masses. Other: Left inguinal hernia again noted containing small bowel loop. No evidence of bowel obstruction. No free air or free fluid. Musculoskeletal: No acute bony abnormality. IMPRESSION: Left inguinal hernia containing a small bowel loop. No  evidence of bowel obstruction. Electronically Signed   By: KRolm BaptiseM.D.   On: 01/05/2019 00:09    Procedures Procedures (including critical care time)  Medications Ordered in ED Medications  iohexol (OMNIPAQUE) 300 MG/ML solution 100 mL (100 mLs Intravenous Contrast Given 01/04/19 2337)     Initial Impression / Assessment and Plan / ED Course  I have  reviewed the triage vital signs and the nursing notes.  Pertinent labs & imaging results that were available during my care of the patient were reviewed by me and considered in my medical decision making (see chart for details).    Signed out to oncoming emergency physician pending CT scan and disposition.  Patient is well-appearing.   Final Clinical Impressions(s) / ED Diagnoses   Final diagnoses:  Generalized abdominal pain  Acute lower UTI  Unilateral inguinal hernia without obstruction or gangrene, recurrence not specified    ED Discharge Orders         Ordered    dicyclomine (BENTYL) 20 MG tablet  2 times daily PRN     01/04/19 2338    cephALEXin (KEFLEX) 500 MG capsule  3 times daily     01/04/19 2338           Julianne Rice, MD 01/06/19 1510

## 2019-01-04 NOTE — ED Triage Notes (Signed)
Pt c/o generalized abd pain that started this evening.  Denies nausea, vomiting, and diarrhea.  Pt seen on 1/17 for constipation.

## 2019-01-05 IMAGING — MR MR HEAD WO/W CM
12 of 15 series · 31 of 48 positions shown · non-contrast
Comparison: 05/16/2016 brain MRI

CLINICAL DATA: Non pulsatile tinnitus

EXAM:
MRI HEAD WITHOUT CONTRAST
MRA HEAD WITHOUT CONTRAST
TECHNIQUE: Multiplanar, multiecho pulse sequences of the brain and surrounding
structures were obtained without intravenous contrast. Angiographic
images of the head were obtained using MRA technique without
contrast.

[Series 5: ax dwi_tracew · axial · 3.0mm · 1.50mm/px · z∈[-90,+51]mm · 7 of 100 slices shown]
[im 1/100]
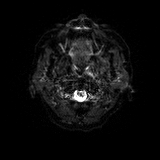
[im 17/100]
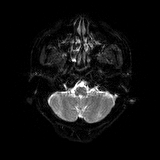
[im 34/100]
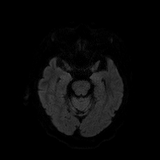
[im 50/100]
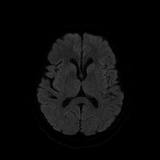
[im 67/100]
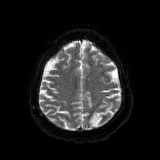
[im 83/100]
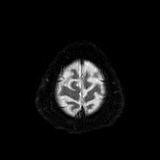
[im 100/100]
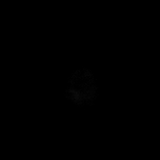

[Series 6: ax dwi_adc · axial · 3.0mm · 1.50mm/px · z∈[-90,+51]mm · 4 of 50 slices shown]
[im 1/50]
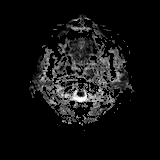
[im 17/50]
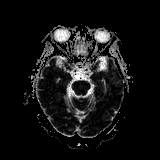
[im 33/50]
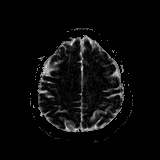
[im 50/50]
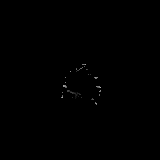

[Series 7: cor dwi_tracew · coronal · 5.0mm · 1.44mm/px · 4 of 64 slices shown]
[im 1/64]
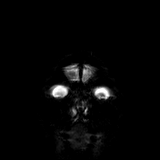
[im 22/64]
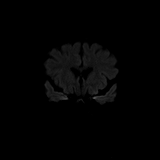
[im 43/64]
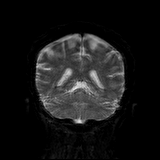
[im 64/64]
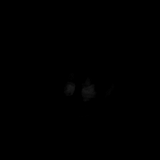

[Series 8: cor dwi_adc · coronal · 5.0mm · 1.44mm/px · 2 of 32 slices shown]
[im 1/32]
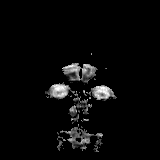
[im 32/32]
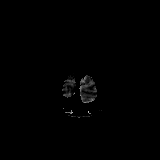

[Series 13: T1 · sagittal · 5.0mm · 0.75mm/px · 1 of 23 slices shown]
[im 1/23]
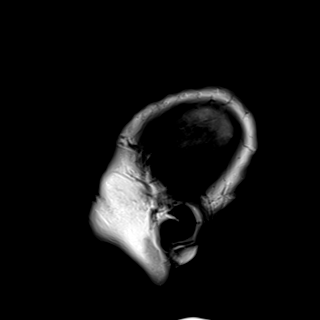

[Series 14: T2 · axial · 5.0mm · 0.72mm/px · 1 of 25 slices shown]
[im 1/25]
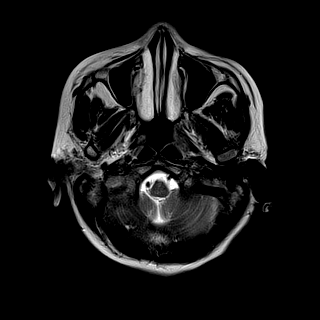

[Series 15: FLAIR · axial · 5.0mm · 0.45mm/px · 1 of 25 slices shown]
[im 1/25]
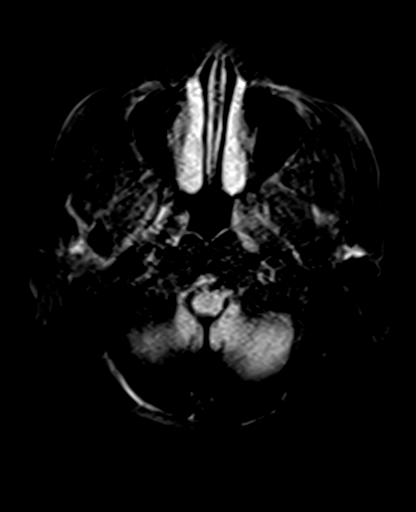

[Series 16: swi_images · axial · 3.0mm · 0.90mm/px · z∈[-100,+70]mm · 3 of 60 slices shown]
[im 1/60]
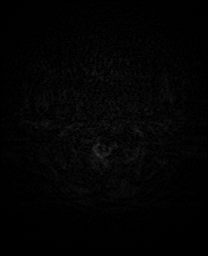
[im 30/60]
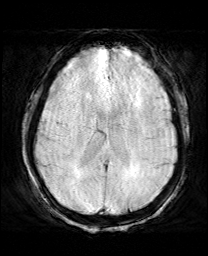
[im 60/60]
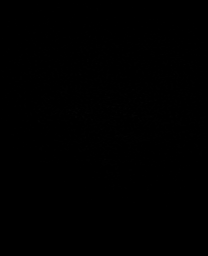

[Series 17: mip_images(sw) · axial · 24.0mm · 0.90mm/px · z∈[-90,+60]mm · 3 of 53 slices shown]
[im 1/53]
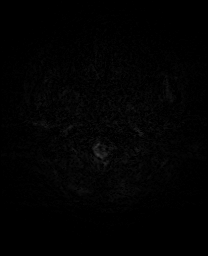
[im 27/53]
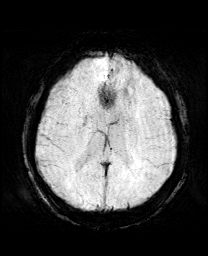
[im 53/53]
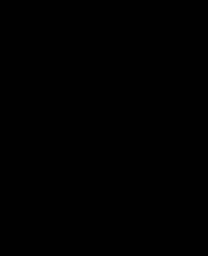

[Series 20: T2 post-contrast · coronal · 5.0mm · 0.72mm/px · 2 of 28 slices shown]
[im 1/28]
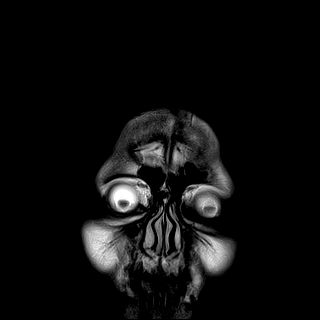
[im 28/28]
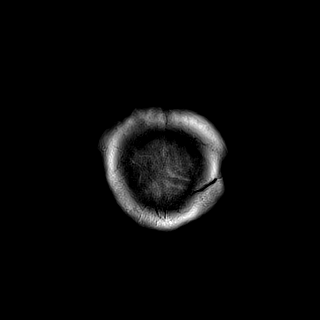

[Series 22: T1 post-contrast · coronal · 5.0mm · 0.34mm/px · 2 of 28 slices shown (1 of 2)]
[im 1/28]
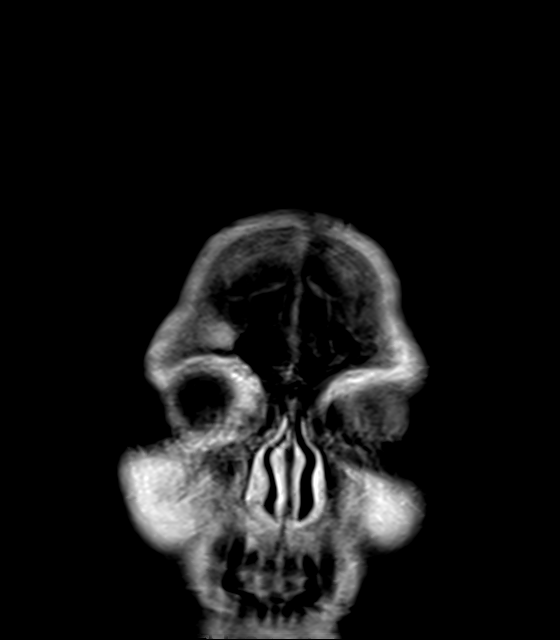
[im 28/28]
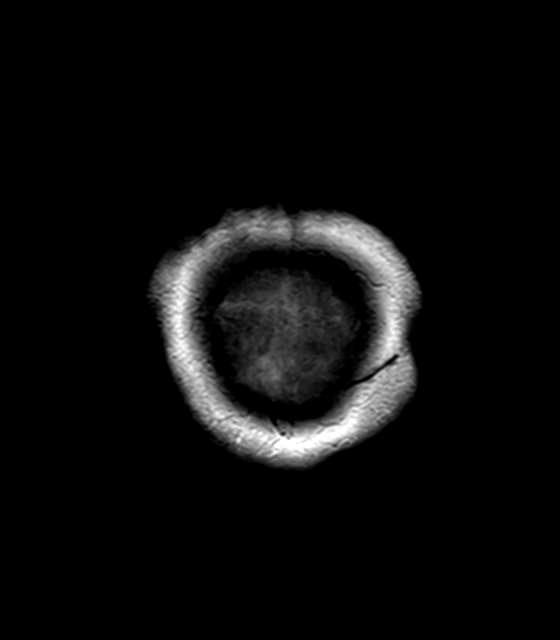

[Series 23: T1 post-contrast · sagittal · 5.0mm · 0.72mm/px · 1 of 23 slices shown (2 of 2)]
[im 1/23]
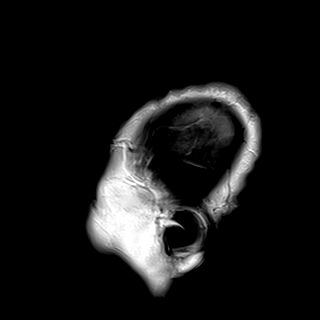

[31 of 48 positions shown; findings below may reference images not displayed]

FINDINGS: MRI HEAD FINDINGS

Brain: No acute infarction, hemorrhage, hydrocephalus, extra-axial
collection or mass lesion. Symmetric unremarkable labyrinthine
signal. Mild chronic small vessel ischemic change in the cerebral
white matter. Again noted is mild increased FLAIR signal in the
medial left cerebrum at the temporal occipital junction without
masslike thickening or signal alteration on the other sequences,
overall benign appearance. Mild chronic small vessel ischemia in the
cerebral white matter.

Vascular: Major flow voids and vascular enhancements are preserved.
Unchanged filling defect at the left transverse sigmoid junction,
likely combination flow artifact and arachnoid granulation.
Patient's symptoms are reportedly non pulsatile and right-sided.

Skull and upper cervical spine: No evidence of marrow lesion

Sinuses/Orbits: Negative

MRA HEAD FINDINGS

Mildly motion degraded.

Atherosclerotic irregularity along the carotid siphons without flow
limiting stenosis. The vertebrobasilar arteries are smooth and
widely patent. No branch occlusion. Negative for aneurysm.
IMPRESSION: Brain MRI:

Stable from 7493.  No evidence of retrocochlear lesion.

Intracranial MRA:

1. No acute finding.
2. Atherosclerosis without flow limiting stenosis.

## 2019-01-05 NOTE — ED Provider Notes (Signed)
12:25 AM  Assumed care from Dr. Lita Mains.  Patient is a 78 year old female who presented to the emergency department with abdominal pain.  Pain has resolved in the ED without intervention.  CT of the abdomen pelvis was pending.  CT shows left inguinal hernia that contains small bowel loop but no sign of obstruction.  Possible mild urinary tract infection.  On reevaluation, patient denies any pain.  Her abdominal exam is benign.  There is no sign of incarceration, strangulation at this time.  I do not appreciate a hernia on exam and she is not tender in this area.  Will discharge home with Bentyl to take as needed as well as Keflex.  Discussed return precautions.  Patient verbalized understanding.  At this time, I do not feel there is any life-threatening condition present. I have reviewed and discussed all results (EKG, imaging, lab, urine as appropriate) and exam findings with patient/family. I have reviewed nursing notes and appropriate previous records.  I feel the patient is safe to be discharged home without further emergent workup and can continue workup as an outpatient as needed. Discussed usual and customary return precautions. Patient/family verbalize understanding and are comfortable with this plan.  Outpatient follow-up has been provided as needed. All questions have been answered.    , Delice Bison, DO 01/05/19 [phone]

## 2019-01-06 LAB — URINE CULTURE: Culture: NO GROWTH

## 2019-01-08 ENCOUNTER — Other Ambulatory Visit: Payer: Self-pay | Admitting: *Deleted

## 2019-01-08 NOTE — Telephone Encounter (Signed)
This encounter was created in error - please disregard.

## 2019-01-08 NOTE — Patient Outreach (Signed)
[facility]) Care Management  01/08/2019  Allison Franco 1941-04-21 [id number]   Referral received from inpatient case manager as member has had multiple ED visits over the last 6 months, total of 13. Per chart, she has history of diabetes and anemia.  Call placed to member, no answer.  HIPAA compliant voice message left.  Unsuccessful outreach letter sent, will follow up within the next 4 business days.  Valente David, [place], MSN [place] [phone]

## 2019-01-10 ENCOUNTER — Encounter: Payer: Self-pay | Admitting: Nurse Practitioner

## 2019-01-10 ENCOUNTER — Ambulatory Visit (INDEPENDENT_AMBULATORY_CARE_PROVIDER_SITE_OTHER): Payer: Medicare Other | Admitting: Nurse Practitioner

## 2019-01-10 VITALS — BP 120/78 | HR 73 | Temp 98.0°F | Ht 62.25 in | Wt 180.4 lb

## 2019-01-10 DIAGNOSIS — E119 Type 2 diabetes mellitus without complications: Secondary | ICD-10-CM | POA: Diagnosis not present

## 2019-01-10 NOTE — Progress Notes (Signed)
Subjective:     Patient ID: Allison Franco , female    DOB: 1941-03-14 , 78 y.o.   MRN: [id number]   Chief Complaint  Patient presents with  . Diabetes    HPI  Diabetes  She presents for her follow-up diabetic visit. She has type 2 diabetes mellitus. Her disease course has been stable. Pertinent negatives for hypoglycemia include no confusion, dizziness, headaches or nervousness/anxiousness. Pertinent negatives for diabetes include no blurred vision, no chest pain, no fatigue, no polydipsia, no polyphagia and no polyuria. There are no hypoglycemic complications. Symptoms are stable. She is following a generally healthy diet. When asked about meal planning, she reported none. She has not had a previous visit with a dietitian. She rarely participates in exercise. (89 - 114.  ) Eye exam is not current (she refuses to see the opthamologist).     Past Medical History:  Diagnosis Date  . Anxiety   . Constipation   . Depression    "following husband's death"  . Diabetes mellitus without complication ([place])   . Hypertension   . Tinnitus      Family History  Problem Relation Age of Onset  . Diabetes Mother   . Hypertension Father      Current Outpatient Medications:  .  acetaminophen (TYLENOL) 500 MG tablet, Take 1,000 mg by mouth every 6 (six) hours as needed for mild pain, moderate pain or headache. Reported on 05/10/2016, Disp: , Rfl:  .  Alcohol Swabs (ALCOHOL WIPES) 70 % PADS, by Does not apply route. Use with blood sugar check and injection of insulin, Disp: , Rfl:  .  blood glucose meter kit and supplies KIT, Dispense based on patient and insurance preference. Use up to four times daily as directed. (FOR ICD-9 250.00, 250.01)., Disp: 1 each, Rfl: 0 .  Calcium 600-200 MG-UNIT tablet, Take 1 tablet by mouth daily., Disp: , Rfl:  .  cephALEXin (KEFLEX) 500 MG capsule, Take 1 capsule (500 mg total) by mouth 3 (three) times daily., Disp: 15 capsule, Rfl: 0 .  Cholecalciferol (VITAMIN D)  50 MCG (2000 UT) tablet, Take 2,000 Units by mouth daily. , Disp: , Rfl:  .  Continuous Blood Gluc Receiver (FREESTYLE LIBRE 14 DAY READER) DEVI, by Does not apply route. Check blood sugar 4 times per day Dx E11.65, Disp: , Rfl:  .  Continuous Blood Gluc Sensor (FREESTYLE LIBRE 14 DAY SENSOR) MISC, by Does not apply route. Check blood sugars 4 times per day. E11.65, Disp: , Rfl:  .  dicyclomine (BENTYL) 20 MG tablet, Take 1 tablet (20 mg total) by mouth 2 (two) times daily as needed for spasms., Disp: 20 tablet, Rfl: 0 .  EPINEPHrine (EPIPEN 2-PAK) 0.3 mg/0.3 mL IJ SOAJ injection, Inject 0.3 mLs (0.3 mg total) into the muscle once as needed (for severe allergic reaction). CAll 911 immediately if you have to use this medicine, Disp: 1 Device, Rfl: 1 .  Insulin Pen Needle (PEN NEEDLES 31GX5/16") 31G X 8 MM MISC, by Does not apply route. Use as directed with insulin pen, Disp: , Rfl:  .  meclizine (ANTIVERT) 25 MG tablet, Take 1 tablet (25 mg total) by mouth 3 (three) times daily as needed for dizziness., Disp: 30 tablet, Rfl: 0 .  metFORMIN (GLUCOPHAGE) 500 MG tablet, TAKE 1 TABLET BY MOUTH 2 TIMES EVERY DAY WITH MORNING AND EVENING MEALS, Disp: 90 tablet, Rfl: 1 .  Multiple Vitamin (MULTIVITAMIN WITH MINERALS) TABS tablet, Take 1 tablet by mouth daily. Centrum, Disp: ,  Rfl:  .  olmesartan (BENICAR) 20 MG tablet, Take 1 tablet (20 mg total) by mouth daily., Disp: 30 tablet, Rfl: 1   Allergies  Allergen Reactions  . Aspirin Other (See Comments)    insomnia  . Hydroxyzine Other (See Comments)    insomnia  . Valium [Diazepam] Other (See Comments)    Insomnia, 05/10/16 pt states she took as young person- made her depressed     Review of Systems  Constitutional: Negative.  Negative for fatigue.  Eyes: Negative for blurred vision and photophobia.  Respiratory: Negative.  Negative for cough.   Cardiovascular: Negative.  Negative for chest pain, palpitations and leg swelling.  Gastrointestinal:  Negative.   Endocrine: Negative.  Negative for polydipsia, polyphagia and polyuria.  Musculoskeletal: Negative.   Skin: Negative.   Neurological: Negative.  Negative for dizziness and headaches.  Psychiatric/Behavioral: Negative for confusion. The patient is not nervous/anxious.      Today's Vitals   01/10/19 1427  BP: 120/78  Pulse: 73  Temp: 98 F (36.7 C)  TempSrc: Oral  Weight: 180 lb 6.4 oz (81.8 kg)  Height: 5' 2.25" (1.581 m)  PainSc: 0-No pain   Body mass index is 32.73 kg/m.   Objective:  Physical Exam Constitutional:      Appearance: She is well-developed.  Neck:     Musculoskeletal: Normal range of motion and neck supple.  Cardiovascular:     Rate and Rhythm: Normal rate and regular rhythm.     Heart sounds: Normal heart sounds. No murmur.  Pulmonary:     Effort: Pulmonary effort is normal.     Breath sounds: Normal breath sounds.  Chest:     Chest wall: No tenderness.  Musculoskeletal: Normal range of motion.  Skin:    General: Skin is warm and dry.     Capillary Refill: Capillary refill takes less than 2 seconds.  Neurological:     General: No focal deficit present.     Mental Status: She is alert and oriented to person, place, and time.  Psychiatric:        Mood and Affect: Mood normal.         Assessment And Plan:     1. Type 2 diabetes mellitus without complication, without long-term current use of insulin (HCC) Chronic,she is doing much better and does not seem to be anxious about her disease process She is controlled with her last HgbA1c of 6.4 down from 15.4 6 months ago Continue with current medications Encouraged to limit intake of sugary foods and drinks Encouraged to increase physical activity to 150 minutes per week I have also discussed with her to avoid going to ER for non emergent issues.  I have advised her to call the office first and we can try to take care of her problem as best as possible.  - Hemoglobin A1c    [name], FNP

## 2019-01-11 ENCOUNTER — Other Ambulatory Visit: Payer: Self-pay | Admitting: *Deleted

## 2019-01-11 LAB — HEMOGLOBIN A1C
Est. average glucose Bld gHb Est-mCnc: 120 mg/dL
Hgb A1c MFr Bld: 5.8 % — ABNORMAL HIGH (ref 4.8–5.6)

## 2019-01-11 NOTE — Patient Outreach (Signed)
[place] [facility]) Care Management  01/11/2019  Crystalynn Mcinerney [birth year] [id number]   Unsuccessful outreach attempt made to contact member for telephone assessment, 2nd attempt.  HIPAA compliant voice message left.  Will follow up within the next 4 business days.  Valente David, [place], MSN [place] [phone]

## 2019-01-17 ENCOUNTER — Other Ambulatory Visit: Payer: Self-pay | Admitting: *Deleted

## 2019-01-17 NOTE — Patient Outreach (Signed)
[place] [facility]) Care Management  01/17/2019  Indea Dearman [birth year] [id number]   Call placed to member for telephone assessment related to multiple ED visits.  No answer, HIPAA compliant voice message left.  This is 3rd consecutive unsuccessful outreach, if no call back by 2/25 will close case due to inability to establish contact.  Valente David, [place], MSN [place] [phone]

## 2019-01-22 ENCOUNTER — Other Ambulatory Visit: Payer: Self-pay | Admitting: *Deleted

## 2019-01-22 NOTE — Patient Outreach (Signed)
[place] [facility]) Care Management  01/22/2019  Judine Arciniega 1941/04/24 [id number]   No response from member after multiple unsuccessful outreach attempts and letter sent.  Will close case at this time due to inability to maintain contact.  Will notify member and primary MD of case closure.   Valente David, [place], MSN [place] [phone]

## 2019-01-30 ENCOUNTER — Other Ambulatory Visit (HOSPITAL_COMMUNITY): Payer: Self-pay | Admitting: Nurse Practitioner

## 2019-02-18 ENCOUNTER — Other Ambulatory Visit: Payer: Self-pay | Admitting: Nurse Practitioner

## 2019-03-04 ENCOUNTER — Telehealth: Payer: Self-pay

## 2019-03-04 NOTE — Telephone Encounter (Signed)
Returned pts call about gi issues. No answer message left

## 2019-03-26 ENCOUNTER — Other Ambulatory Visit: Payer: Self-pay | Admitting: Internal Medicine

## 2019-03-26 MED FILL — OLMESARTAN MEDOXOMIL 20 MG: 20 | 30 days supply | Qty: 30 | Fill #0

## 2019-03-28 ENCOUNTER — Ambulatory Visit: Payer: Self-pay

## 2019-03-28 DIAGNOSIS — I10 Essential (primary) hypertension: Secondary | ICD-10-CM

## 2019-03-28 DIAGNOSIS — R7309 Other abnormal glucose: Secondary | ICD-10-CM

## 2019-03-28 DIAGNOSIS — E119 Type 2 diabetes mellitus without complications: Secondary | ICD-10-CM

## 2019-03-28 NOTE — Chronic Care Management (AMB) (Signed)
  Chronic Care Management   Telephone Outreach Note  03/28/2019 Name: Allison Franco MRN: [id number] DOB: 08/21/1941  Referred by: patient's health plan.  Unsuccessful call to [name] today by phone in response to a referral sent by [name] health plan.   A HIPPA compliant phone message was left for the patient providing contact information and requesting a return call.  The CM team will reach out to the patient again over the next 7-10 days.    [name], FNP has been notified of this outreach  plan.   Daneen Schick, BSW, CDP [facility] / [facility] Care Management Social Worker [phone]  Total time spent performing care coordination and/or care management activities with the patient by phone or face to face = 5 minutes.

## 2019-03-29 ENCOUNTER — Ambulatory Visit: Payer: Self-pay

## 2019-03-29 ENCOUNTER — Ambulatory Visit: Payer: Self-pay | Admitting: Pharmacist

## 2019-03-29 DIAGNOSIS — I10 Essential (primary) hypertension: Secondary | ICD-10-CM

## 2019-03-29 DIAGNOSIS — E119 Type 2 diabetes mellitus without complications: Secondary | ICD-10-CM

## 2019-03-29 NOTE — Progress Notes (Signed)
  Chronic Care Management   Initial Visit Note  03/29/2019 Name: Allison Franco MRN: [id number] DOB: 04/27/1941  Referred by: [name], FNP Reason for referral : Chronic Care Management   Allison Franco is a 78 y.o. year old female who is a primary care patient of [name], [place]. The CCM team was consulted for assistance with chronic disease management and care coordination needs.   Review of patient status, including review of consultants reports, relevant laboratory and other test results, and collaboration with appropriate care team members and the patient's provider was performed as part of comprehensive patient evaluation and provision of chronic care management services.    Objective:   Goals Addressed            This Visit's Progress     Patient Stated   . "I need help getting my blood pressure medicine" (pt-stated)       Current Barriers:  . Limited knowledge of how to access refills . Unable to distinguish if the medication needed has refills on the current prescription  Clinical Social Work Clinical Goal(s):  [name] Over the next 7 days, patient will work with CCM pharmacist to address needs related to prescription refills  Interventions: . Patient interviewed and appropriate assessments performed . Discussed plans with patient for ongoing care management follow up and provided patient with direct contact information for care management team  . Assessed patient for symptoms of high blood pressure (dizziness, headache) patient denies any new symptoms  . Collaboration with CCM Pharmacist, Lottie Dawson to convey patient medication needs - patient reported being out of blood pressure medication x 3 days . Call placed to [facility]-olmesartan was shipped out on 03/26/19 & should be arriving today.  Left message with patient requesting call back.  Patient Self Care Activities:  . Self administers medications as prescribed . Performs ADL's independently . Calls provider  office for new concerns or questions  Initial goal documentation      PLAN: -The CM team will reach out to the patient again over the next 14 days.   -Explore patient's medications/utilization during next visit   Regina Eck, PharmD, [place] Clinical Pharmacist, [facility] [place]: [phone]

## 2019-03-29 NOTE — Patient Instructions (Signed)
Visit Information  Goals Addressed            This Visit's Progress     Patient Stated   . "I need help getting my blood pressure medicine" (pt-stated)       Current Barriers:  . Limited knowledge of how to access refills . Unable to distinguish if the medication needed has refills on the current prescription  Clinical Social Work Clinical Goal(s):  [name] Over the next 7 days, patient will work with CCM pharmacist to address needs related to prescription refills  Interventions: . Patient interviewed and appropriate assessments performed . Discussed plans with patient for ongoing care management follow up and provided patient with direct contact information for care management team  . Assessed patient for symptoms of high blood pressure (dizziness, headache) patient denies any new symptoms  . Collaboration with CCM Pharmacist, Lottie Dawson to convey patient medication needs - patient reported being out of blood pressure medication x 3 days . Call placed to [facility]-olmesartan was shipped out on 03/26/19 & should be arriving today.  Left message with patient requesting call back.  Patient Self Care Activities:  . Self administers medications as prescribed . Performs ADL's independently . Calls provider office for new concerns or questions  Initial goal documentation        The patient verbalized understanding of instructions provided today and declined a print copy of patient instruction materials.   The CM team will reach out to the patient again over the next 14 days.   Regina Eck, PharmD, BCPS Clinical Pharmacist, [facility] [place]: [phone]

## 2019-03-29 NOTE — Chronic Care Management (AMB) (Signed)
  Chronic Care Management    Clinical Social Work General Note  03/29/2019 Name: Capucine Tryon MRN: [id number] DOB: Dec 25, 1940  Allison Franco is a 78 y.o. year old female who is a primary care patient of [name], [place]. The patient was referred to the CCM team by the patients health plan to assist with care coordination and complex disease management related to the patients health conditions.  Ms. Bisig was given information about Chronic Care Management services today including:  1. CCM service includes personalized support from designated clinical staff supervised by her physician, including individualized plan of care and coordination with other care providers 2. 24/7 contact phone numbers for assistance for urgent and routine care needs. 3. Service will only be billed when office clinical staff spend 20 minutes or more in a month to coordinate care. 4. Only one practitioner may furnish and bill the service in a calendar month. 5. The patient may stop CCM services at any time (effective at the end of the month) by phone call to the office staff. 6. The patient will be responsible for cost sharing (co-pay) of up to 20% of the service fee (after annual deductible is met).  Patient agreed to services and verbal consent obtained.   Review of patient status, including review of consultants reports, relevant laboratory and other test results, and collaboration with appropriate care team members and the patient's provider was performed as part of comprehensive patient evaluation and provision of chronic care management services.     Goals Addressed            This Visit's Progress     Patient Stated   . "I need help getting my blood pressure medicine" (pt-stated)       Current Barriers:  . Limited knowledge of how to access refills . Unable to distinguish if the medication needed has refills on the current prescription  Clinical Social Work Clinical Goal(s):  [name] Over the next 7 days,  patient will work with CCM pharmacist to address needs related to prescription refills  Interventions: . Patient interviewed and appropriate assessments performed . Discussed plans with patient for ongoing care management follow up and provided patient with direct contact information for care management team  . Assessed patient for symptoms of high blood pressure (dizziness, headache) patient denies any new symptoms  . Collaboration with CCM Pharmacist, Lottie Dawson to convey patient medication needs - patient reported being out of blood pressure medication x 3 days  Patient Self Care Activities:  . Self administers medications as prescribed . Performs ADL's independently . Calls provider office for new concerns or questions  Initial goal documentation         Follow Up Plan: CCM SW will outreach the patient over the next week to complete CCM SW screen.       Daneen Schick, BSW, CDP [facility] / [facility] Care Management Social Worker [phone]  Total time spent performing care coordination and/or care management activities with the patient by phone or face to face = 15 minutes.

## 2019-03-29 NOTE — Chronic Care Management (AMB) (Signed)
  Chronic Care Management   Initial Visit Note  03/29/2019 Name: Allison Franco MRN: [id number] DOB: 1941-01-02  Referred by: [name], FNP Reason for referral : Care Coordination   Allison Franco is a 78 y.o. year old female who is a primary care patient of [name], [place]. The CCM team was consulted for assistance with chronic disease management and care coordination needs.   Review of patient status, including review of consultants reports, relevant laboratory and other test results, and collaboration with appropriate care team members and the patient's provider was performed as part of comprehensive patient evaluation and provision of chronic care management services.    I initiated and established the plan of care for Allison Franco during one on one collaboration with my clinical care management colleague Allison Franco BSW who is also engaged with this patient to address social work needs.   Goals Addressed      Patient Stated   . "I need help getting my blood pressure medicine" (pt-stated)       Current Barriers:  . Limited knowledge of how to access refills . Unable to distinguish if the medication needed has refills on the current prescription  Clinical Social Work Clinical Goal(s):  [name] Over the next 7 days, patient will work with CCM pharmacist to address needs related to prescription refills  Interventions: . Patient interviewed and appropriate assessments performed . Discussed plans with patient for ongoing care management follow up and provided patient with direct contact information for care management team  . Assessed patient for symptoms of high blood pressure (dizziness, headache) patient denies any new symptoms  . Collaboration with CCM Pharmacist, Lottie Dawson to convey patient medication needs - patient reported being out of blood pressure medication x 3 days  Patient Self Care Activities:  . Self administers medications as prescribed . Performs ADL's independently  . Calls provider office for new concerns or questions  Initial goal documentation       Other   . Assist with Disease Management and Care Coordination       Current Barriers:  [name] Knowledge Barriers related to resources and support available to address needs related to chronic disease management and community resources.  Case Manager Clinical Goal(s):  [name] Over the next 30 days, patient will work with the CCM team to address needs related to [employer], Chronic Disease Self Health Management, and improved medication management.   Interventions:  . Collaborated with BSW and initiated plan of care to address needs related to community resources and referral to embedded Pharm D.   Patient Self Care Activities:  . Self administers medications as prescribed . Performs ADL's independently . Calls provider office for new concerns or questions  Initial goal documentation        Telephone follow up appointment with CCM team member scheduled for: 04/05/19  Barb Merino, [facility] Care Management Coordinator [place] Management/[facility]  Direct Phone: [phone]

## 2019-03-29 NOTE — Patient Instructions (Signed)
Social Worker Visit Information  Goals we discussed today:  Goals Addressed            This Visit's Progress     Patient Stated   . "I need help getting my blood pressure medicine" (pt-stated)       Current Barriers:  . Limited knowledge of how to access refills . Unable to distinguish if the medication needed has refills on the current prescription  Clinical Social Work Clinical Goal(s):  [name] Over the next 7 days, patient will work with CCM pharmacist to address needs related to prescription refills  Interventions: . Patient interviewed and appropriate assessments performed . Discussed plans with patient for ongoing care management follow up and provided patient with direct contact information for care management team  . Assessed patient for symptoms of high blood pressure (dizziness, headache) patient denies any new symptoms  . Collaboration with CCM Pharmacist, Lottie Dawson to convey patient medication needs - patient reported being out of blood pressure medication x 3 days  Patient Self Care Activities:  . Self administers medications as prescribed . Performs ADL's independently . Calls provider office for new concerns or questions  Initial goal documentation         Materials provided: Verbal education about CCM program provided by phone  Ms. Serratore was given information about Chronic Care Management services today including:  1. CCM service includes personalized support from designated clinical staff supervised by her physician, including individualized plan of care and coordination with other care providers 2. 24/7 contact phone numbers for assistance for urgent and routine care needs. 3. Service will only be billed when office clinical staff spend 20 minutes or more in a month to coordinate care. 4. Only one practitioner may furnish and bill the service in a calendar month. 5. The patient may stop CCM services at any time (effective at the end of the month) by phone  call to the office staff. 6. The patient will be responsible for cost sharing (co-pay) of up to 20% of the service fee (after annual deductible is met).  Patient agreed to services and verbal consent obtained.   The patient verbalized understanding of instructions provided today and declined a print copy of patient instruction materials.   Follow up plan: SW will follow up with patient by phone over the next week  Daneen Schick, BSW, CDP [facility] / [place] Management Social Worker [phone]

## 2019-04-02 ENCOUNTER — Telehealth: Payer: Self-pay

## 2019-04-02 ENCOUNTER — Ambulatory Visit: Payer: Self-pay

## 2019-04-02 DIAGNOSIS — I10 Essential (primary) hypertension: Secondary | ICD-10-CM

## 2019-04-02 DIAGNOSIS — E119 Type 2 diabetes mellitus without complications: Secondary | ICD-10-CM

## 2019-04-02 NOTE — Chronic Care Management (AMB) (Signed)
  Chronic Care Management   Outreach Note  04/02/2019 Name: Allison Franco MRN: [id number] DOB: [birth year]  Referred by: [name], FNP Reason for referral : Care Coordination   An unsuccessful telephone outreach was attempted today. The patient was referred to the case management team by for assistance with chronic care management and care coordination.   Follow Up Plan: A HIPPA compliant phone message was left for the patient providing contact information and requesting a return call.  The CM team will reach out to the patient again over the next 7-10 days.   Daneen Schick, BSW, CDP [facility] / [facility] Care Management Social Worker [phone]  Total time spent performing care coordination and/or care management activities with the patient by phone or face to face = 3 minutes.

## 2019-04-05 ENCOUNTER — Telehealth: Payer: Self-pay

## 2019-04-08 ENCOUNTER — Ambulatory Visit: Payer: Self-pay

## 2019-04-08 ENCOUNTER — Telehealth: Payer: Self-pay

## 2019-04-08 DIAGNOSIS — E119 Type 2 diabetes mellitus without complications: Secondary | ICD-10-CM

## 2019-04-08 DIAGNOSIS — I10 Essential (primary) hypertension: Secondary | ICD-10-CM

## 2019-04-08 NOTE — Chronic Care Management (AMB) (Signed)
  Chronic Care Management   Outreach Note  04/08/2019 Name: Kaveri Perras MRN: [id number] DOB: [birth year]  Referred by: [name], FNP Reason for referral : Care Coordination   A second unsuccessful telephone outreach was attempted today. The patient was referred to the case management team for assistance with chronic care management and care coordination.   Follow Up Plan: A HIPPA compliant phone message was left for the patient providing contact information and requesting a return call.  The CM team will reach out to the patient again over the next 7-10 days.   Daneen Schick, BSW, CDP [facility] / [facility] Care Management Social Worker [phone]  Total time spent performing care coordination and/or care management activities with the patient by phone or face to face = 5 minutes.

## 2019-04-08 NOTE — Telephone Encounter (Signed)
I called pt to see if she is taking metformin daily as she is supposed because [facility] sent over a letter stating she may not be taking it. She stated she is taking it everday and just recently had a refill sent to her. YRL,RMA

## 2019-04-11 ENCOUNTER — Ambulatory Visit: Payer: Medicare Other | Admitting: Nurse Practitioner

## 2019-04-11 ENCOUNTER — Telehealth: Payer: Self-pay

## 2019-04-11 ENCOUNTER — Ambulatory Visit (INDEPENDENT_AMBULATORY_CARE_PROVIDER_SITE_OTHER): Payer: Medicare Other

## 2019-04-11 DIAGNOSIS — I10 Essential (primary) hypertension: Secondary | ICD-10-CM | POA: Diagnosis not present

## 2019-04-11 DIAGNOSIS — E119 Type 2 diabetes mellitus without complications: Secondary | ICD-10-CM

## 2019-04-11 NOTE — Patient Instructions (Signed)
Social Worker Visit Information  Goals we discussed today:  Goals Addressed            This Visit's Progress     Patient Stated   . "I need to go shopping but I do not know how much money I have" (pt-stated)       Current Barriers:  . Financial constraints . Limited social support  . Limited knowledge on how to obtain checking account balance information  Clinical Social Work Clinical Goal(s):  [name] Over the next 30 days, patient will be able to verbalize knowledge of how to obtain bank information.  Interventions: . Patient interviewed and appropriate assessments performed  . Determined the patient receives income from deceased husband SSI benefit as well as a PT job with [employer] . The patient reports she is "unsure" if she is still getting paid while out of work and does not know how to check her account balance . Educated the patient on how to contact her bank to obtain banking information  . Advised by the patient she has called her bank and was told they were no longer giving out balance information due to COVID 19 . Encouraged the patient to contact her bank again to request needed information  . Assessed the patient for urgent financial need - the patient reports she is beginning to get low on food but is unsure if she can go to the grocery store due to the inability to identify how much money she has . Determined the patient is awaiting to "see" if she will get paid from [facility] in the next 1-2 days . Arranged follow up appointment to contact the patient on Monday to assess if food resources are needed  Patient Self Care Activities:  . Performs ADL's independently . Calls provider office for new concerns or questions  Initial goal documentation     . "I want to feel safer in my home" (pt-stated)       Current Barriers:  . Financial constraints . Limited social support . Housing barriers   Clinical Social Work Clinical Goal(s):  [name] Over  the next 20 days, patient will work with Radiographer, therapeutic to address needs related to security of the home  Interventions: . Patient interviewed and appropriate assessments performed  . Obtained information from the patient that she recently found a snake near her bed. The patient reports she killed the snake and spoke with the apartment manager about concern. The patient stated her apartment manager plans to seal specific areas that may have enabled the snake into the home . Encouraged the patient to stay in contact with her apartment manager regarding safety concerns . Scheduled follow up call to the patient to confirm work has been performed  Patient Self Care Activities:  . Self administers medications as prescribed . Attends all scheduled provider appointments . Performs ADL's independently  Initial goal documentation         Materials Provided: No: Patient declined  Follow Up Plan: SW will follow up with patient by phone over the next 2-3 business days   Daneen Schick, [place], CDP [facility] / [place] Management Social Worker [phone]

## 2019-04-11 NOTE — Chronic Care Management (AMB) (Signed)
Chronic Care Management    Clinical Social Work Follow Up Note  04/11/2019 Name: Allison Franco MRN: [id number] DOB: 01/14/1941  Allison Franco is a 78 y.o. year old female who is a primary care patient of [name], [place]. The CCM team was consulted for assistance with [employer].   Review of patient status, including review of consultants reports, other relevant assessments, and collaboration with appropriate care team members and the patient's provider was performed as part of comprehensive patient evaluation and provision of chronic care management services.     I placed a follow up call to the patient to complete a Social Determinants of Health (SDOH) screen. The patient identified challenges with housing and financial stress. See care plan below.  Goals Addressed            This Visit's Progress     Patient Stated   . "I need to go shopping but I do not know how much money I have" (pt-stated)       Current Barriers:  . Financial constraints . Limited social support  . Limited knowledge on how to obtain checking account balance information  Clinical Social Work Clinical Goal(s):  [name] Over the next 30 days, patient will be able to verbalize knowledge of how to obtain bank information.  Interventions: . Patient interviewed and appropriate assessments performed  . 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 The patient also reports assistance is received by [employer] . The patient reports she is "unsure" if she is still getting paid while out of work and does not know how to check her account balance . Educated the patient on how to contact her bank to obtain banking information  . Advised by the patient she has called her bank and was told they were no longer giving out balance information due to COVID 19 . Encouraged the patient to contact her bank again to request needed  information  . Assessed the patient for urgent financial need - the patient reports she is beginning to get low on food but is unsure if she can go to the grocery store due to the inability to identify how much money she has . Determined the patient is awaiting to "see" if she will get paid from [facility] in the next 1-2 days . Arranged follow up appointment to contact the patient on Monday to assess if food resources are needed  Patient Self Care Activities:  . Performs ADL's independently . Calls provider office for new concerns or questions  Initial goal documentation     . "I want to feel safer in my home" (pt-stated)       Current Barriers:  . Financial constraints . Limited social support . Housing barriers   Clinical Social Work Clinical Goal(s):  [name] Over the next 20 days, patient will work with Radiographer, therapeutic to address needs related to security of the home  Interventions: . Patient interviewed and appropriate assessments performed  . Obtained information from the patient that she recently found a snake near her bed. The patient reports she killed the snake and spoke with the apartment manager about concern. The patient stated her apartment manager plans to seal specific areas that may have enabled the snake into the home . Encouraged the patient to stay in contact with her apartment manager regarding safety concerns . Scheduled follow up call to the patient to confirm work  has been performed  Patient Self Care Activities:  . Self administers medications as prescribed . Attends all scheduled provider appointments . Performs ADL's independently  Initial goal documentation         Follow Up Plan: SW will follow up with patient by phone over the next 2-3 business days   Daneen Schick, [place], CDP [facility] / [facility] Care Management Social Worker [phone]  Total time spent performing care coordination and/or care management activities with the patient by  phone or face to face = 35 minutes.

## 2019-04-12 ENCOUNTER — Ambulatory Visit: Payer: Self-pay

## 2019-04-12 DIAGNOSIS — I10 Essential (primary) hypertension: Secondary | ICD-10-CM | POA: Diagnosis not present

## 2019-04-12 DIAGNOSIS — E119 Type 2 diabetes mellitus without complications: Secondary | ICD-10-CM | POA: Diagnosis not present

## 2019-04-12 NOTE — Chronic Care Management (AMB) (Signed)
  Chronic Care Management   Outreach Note  04/12/2019 Name: Allison Franco MRN: [id number] DOB: 1941/11/22  Referred by: [name], FNP Reason for referral : Chronic Care Management (INITIAL CCM RN TELEPHONE OUTREACH )   An unsuccessful telephone outreach was attempted today. The patient was referred to the case management team by her healthplan for assistance with chronic care management and care coordination.   Follow Up Plan: The CM team will reach out to the patient again over the next 7-14 days.   Barb Merino, RN,CCM Care Management Coordinator [place] [facility]  Direct Phone: [phone]

## 2019-04-15 ENCOUNTER — Ambulatory Visit: Payer: Medicare Other

## 2019-04-15 DIAGNOSIS — I10 Essential (primary) hypertension: Secondary | ICD-10-CM

## 2019-04-15 DIAGNOSIS — E119 Type 2 diabetes mellitus without complications: Secondary | ICD-10-CM

## 2019-04-15 MED FILL — metFORMIN HCL 500 MG TABS: 500 | 45 days supply | Qty: 90 | Fill #0

## 2019-04-15 MED FILL — OLMESARTAN MEDOXOMIL 20 MG: 20 | 30 days supply | Qty: 30 | Fill #1

## 2019-04-15 NOTE — Chronic Care Management (AMB) (Signed)
  Chronic Care Management    Clinical Social Work Follow Up Note  04/15/2019 Name: Allison Franco MRN: [id number] DOB: 13-Mar-1941  Allison Franco is a 78 y.o. year old female who is a primary care patient of [name], [place]. The CCM team was consulted for assistance with [employer].   Review of patient status, including review of consultants reports, other relevant assessments, and collaboration with appropriate care team members and the patient's provider was performed as part of comprehensive patient evaluation and provision of chronic care management services.    I placed a follow up call to the patient to assess progression of patient stated goals.   Goals Addressed            This Visit's Progress     Patient Stated   . COMPLETED: "I need to go shopping but I do not know how much money I have" (pt-stated)       Current Barriers:  . Financial constraints . Limited social support  . Limited knowledge on how to obtain checking account balance information  Clinical Social Work Clinical Goal(s):  [name] Over the next 30 days, patient will be able to verbalize knowledge of how to obtain bank information.  Interventions: . Telephonic follow up by CCM SW to assess patients progress with food insecurity . Determined the patient was able to obtain banking information . Confirmed the patient has obtained needed food supplies in the home . Advised the patient to contact CCM SW if food resources are needed at a later time  Patient Self Care Activities:  . Performs ADL's independently . Calls provider office for new concerns or questions  Please see past updates related to this goal by clicking on the "Past Updates" button in the selected goal      . COMPLETED: "I want to feel safer in my home" (pt-stated)       Current Barriers:  . Financial constraints . Limited social support . Housing barriers   Clinical Social Work Clinical Goal(s):  [name] Over the next 20 days, patient will  work with Radiographer, therapeutic to address needs related to security of the home  Interventions: . Telephonic follow up by CCM SW . Confirmed the patients apartment manager has sealed all questionable areas of the home to prevent future snakes in the home . Determined the patient has worked to remove flowers and brush from around the home to deter snakes  . The patient reports "I know I am safe now" . Encouraged the patient to contact CCM SW if future concerns with home occur  Patient Self Care Activities:  . Self administers medications as prescribed . Attends all scheduled provider appointments . Performs ADL's independently  Please see past updates related to this goal by clicking on the "Past Updates" button in the selected goal          Follow Up Plan: No further SW follow up planned at this time. Patient will contact CCM SW when needed.   Daneen Schick, BSW, CDP [facility] / [facility] Care Management Social Worker [phone]  Total time spent performing care coordination and/or care management activities with the patient by phone or face to face = 14 minutes.

## 2019-04-15 NOTE — Patient Instructions (Signed)
Social Worker Visit Information  Goals we discussed today:  Goals Addressed            This Visit's Progress     Patient Stated   . COMPLETED: "I need to go shopping but I do not know how much money I have" (pt-stated)       Current Barriers:  . Financial constraints . Limited social support  . Limited knowledge on how to obtain checking account balance information  Clinical Social Work Clinical Goal(s):  [name] Over the next 30 days, patient will be able to verbalize knowledge of how to obtain bank information.  Interventions: . Telephonic follow up by CCM SW to assess patients progress with food insecurity . Determine the patient was able to obtain banking information . Confirmed the patient has needed food supplies in the home . Advised the patient to contact CCM SW if food resources are needed at a later time  Patient Self Care Activities:  . Performs ADL's independently . Calls provider office for new concerns or questions  Please see past updates related to this goal by clicking on the "Past Updates" button in the selected goal      . COMPLETED: "I want to feel safer in my home" (pt-stated)       Current Barriers:  . Financial constraints . Limited social support . Housing barriers   Clinical Social Work Clinical Goal(s):  [name] Over the next 20 days, patient will work with Radiographer, therapeutic to address needs related to security of the home  Interventions: . Telephonic follow up by CCM SW . Confirmed the patients apartment manager has sealed all questionable areas of the home to prevent future snakes in the home . Determined the patient has worked to remove flowers and brush from around the home to deter snakes  . The patient reports "I know I am safe now" . Encouraged the patient to contact CCM SW if future concerns with home occur  Patient Self Care Activities:  . Self administers medications as prescribed . Attends all scheduled provider appointments . Performs  ADL's independently  Please see past updates related to this goal by clicking on the "Past Updates" button in the selected goal          Materials Provided: No: Patient declined  Follow Up Plan: Client will contact CCM SW when needed   Daneen Schick, Arita Miss, CDP [facility] / [facility] Care Management Social Worker [phone]

## 2019-04-29 ENCOUNTER — Telehealth: Payer: Self-pay

## 2019-04-30 ENCOUNTER — Ambulatory Visit: Payer: Self-pay | Admitting: Pharmacist

## 2019-04-30 DIAGNOSIS — E119 Type 2 diabetes mellitus without complications: Secondary | ICD-10-CM

## 2019-04-30 DIAGNOSIS — I10 Essential (primary) hypertension: Secondary | ICD-10-CM

## 2019-04-30 NOTE — Patient Instructions (Addendum)
Visit Information  Goals Addressed            This Visit's Progress     Patient Stated   . COMPLETED: "I need help getting my blood pressure medicine" (pt-stated)       Current Barriers:  . Limited knowledge of how to access refills . Unable to distinguish if the medication needed has refills on the current prescription  Clinical Social Work Clinical Goal(s):  [name] Over the next 7 days, patient will work with CCM pharmacist to address needs related to prescription refills  Interventions: . Patient interviewed and appropriate assessments performed . Discussed plans with patient for ongoing care management follow up and provided patient with direct contact information for care management team  . Assessed patient for symptoms of high blood pressure (dizziness, headache) patient denies any new symptoms  . Collaboration with CCM Pharmacist, Lottie Dawson to convey patient medication needs - patient reported being out of blood pressure medication x 3 days . Call placed to [facility]-olmesartan/metformin was shipped out on 04/15/19 (patient received)  . Patient Self Care Activities:  . Self administers medications as prescribed . Performs ADL's independently . Calls provider office for new concerns or questions  Initial goal documentation     . I would like to continue managing my diabetes and blood pressure (pt-stated)       Current Barriers:  . Language Barriers . Financial Barriers  Pharmacist Clinical Goal(s):  [name] Over the next 30  days, patient will work with PharmD and PCP  to address needs related to optimized medication management of chronic conditions  Interventions: . Comprehensive medication review performed. . Advised patient to continue taking medications as prescribed and checking BGs every morning as directed by PCP . [facility] with [facility]-patient's last fills for BP/BG medications were on 04/15/19-->patient set up for continued delivery. community  pharmacy  . Patient reports checking BG every morning.  It ranges from 80-100 depending on her diet. Last A1c was 5.8 on 12/2018 . She states her blood pressure has also been at goal <130/80 . Discussed plans with patient for ongoing care management follow up and provided patient with direct contact information for care management team . Reminded patient of PCP appt on 05/09/19 at 11:45am  Patient Self Care Activities:  . Self administers medications as prescribed . Attends all scheduled provider appointments . Calls pharmacy for medication refills  Initial goal documentation        The patient verbalized understanding of instructions provided today and declined a print copy of patient instruction materials.   The care management team will reach out to the patient again over the next 21 days.   Regina Eck, PharmD, BCPS Clinical Pharmacist, [facility] [place]: [phone]

## 2019-04-30 NOTE — Progress Notes (Signed)
  Chronic Care Management   Visit Note  04/30/2019 Name: Allison Franco MRN: [id number] DOB: 03-28-1941  Referred by: [name], FNP Reason for referral : Chronic Care Management   [name] is a 78 y.o. year old female who is a primary care patient of [name], [place]. The CCM team was consulted for assistance with chronic disease management and care coordination needs.   Review of patient status, including review of consultants reports, relevant laboratory and other test results, and collaboration with appropriate care team members and the patient's provider was performed as part of comprehensive patient evaluation and provision of chronic care management services.    I spoke with [name] by telephone today.  Objective:   Goals Addressed            This Visit's Progress     Patient Stated   . COMPLETED: "I need help getting my blood pressure medicine" (pt-stated)       Current Barriers:  . Limited knowledge of how to access refills . Unable to distinguish if the medication needed has refills on the current prescription  Clinical Social Work Clinical Goal(s):  [name] Over the next 7 days, patient will work with CCM pharmacist to address needs related to prescription refills  Interventions: . Patient interviewed and appropriate assessments performed . Discussed plans with patient for ongoing care management follow up and provided patient with direct contact information for care management team  . Assessed patient for symptoms of high blood pressure (dizziness, headache) patient denies any new symptoms  . Collaboration with CCM Pharmacist, Lottie Dawson to convey patient medication needs - patient reported being out of blood pressure medication x 3 days . Call placed to [facility]-olmesartan/metformin was shipped out on 04/15/19 (patient received)  . Patient Self Care Activities:  . Self administers medications as prescribed . Performs ADL's independently . Calls  provider office for new concerns or questions  Initial goal documentation     . I would like to continue managing my diabetes and blood pressure (pt-stated)       Current Barriers:  . Language Barriers . Financial Barriers  Pharmacist Clinical Goal(s):  [name] Over the next 30  days, patient will work with PharmD and PCP  to address needs related to optimized medication management of chronic conditions  Interventions: . Comprehensive medication review performed. . Advised patient to continue taking medications as prescribed and checking BGs every morning as directed by PCP . Collaboration with [facility]-patient's last fills for BP/BG medications were on 04/15/19-->patient set up for continued delivery. community pharmacy  . Patient reports checking BG every morning.  It ranges from 80-100 depending on her diet. Last A1c was 5.8 on 12/2018 . She states her blood pressure has also been at goal <130/80 . Discussed plans with patient for ongoing care management follow up and provided patient with direct contact information for care management team . Reminded patient of PCP appt on 05/09/19 at 11:45am  Patient Self Care Activities:  . Self administers medications as prescribed . Attends all scheduled provider appointments . Calls pharmacy for medication refills  Initial goal documentation         Plan:   The care management team will reach out to the patient again over the next 21 days.   Regina Eck, PharmD, BCPS Clinical Pharmacist, [facility] [place]: [phone]

## 2019-05-09 ENCOUNTER — Ambulatory Visit: Payer: Medicare Other | Admitting: Nurse Practitioner

## 2019-05-09 ENCOUNTER — Encounter: Payer: Self-pay | Admitting: Nurse Practitioner

## 2019-05-09 ENCOUNTER — Ambulatory Visit (INDEPENDENT_AMBULATORY_CARE_PROVIDER_SITE_OTHER): Payer: Medicare Other | Admitting: Nurse Practitioner

## 2019-05-09 ENCOUNTER — Other Ambulatory Visit: Payer: Self-pay

## 2019-05-09 VITALS — BP 122/72 | HR 80 | Temp 98.2°F | Ht 63.4 in | Wt 188.8 lb

## 2019-05-09 DIAGNOSIS — E119 Type 2 diabetes mellitus without complications: Secondary | ICD-10-CM | POA: Diagnosis not present

## 2019-05-09 DIAGNOSIS — E2839 Other primary ovarian failure: Secondary | ICD-10-CM

## 2019-05-09 DIAGNOSIS — I10 Essential (primary) hypertension: Secondary | ICD-10-CM | POA: Diagnosis not present

## 2019-05-09 DIAGNOSIS — Z1211 Encounter for screening for malignant neoplasm of colon: Secondary | ICD-10-CM

## 2019-05-09 NOTE — Progress Notes (Signed)
Subjective:     Patient ID: Allison Franco , female    DOB: 25-Mar-1941 , 78 y.o.   MRN: [id number]   Chief Complaint  Patient presents with  . Diabetes    HPI  She tells me she feels "movement to her anus" she was treated in January 2020.    Diabetes She presents for her follow-up diabetic visit. She has type 2 diabetes mellitus. Her disease course has been stable. There are no hypoglycemic associated symptoms. Pertinent negatives for hypoglycemia include no dizziness or headaches. There are no diabetic associated symptoms. Pertinent negatives for diabetes include no chest pain. There are no hypoglycemic complications. There are no diabetic complications. She is compliant with treatment all of the time. She is following a diabetic diet. When asked about meal planning, she reported none. She has not had a previous visit with a dietitian. There is no change in her home blood glucose trend. (Reporting her blood sugar has been good) An ACE inhibitor/angiotensin II receptor blocker is being taken. She does not see a podiatrist.Eye exam is not current.     Past Medical History:  Diagnosis Date  . Anxiety   . Constipation   . Depression    "following husband's death"  . Diabetes mellitus without complication ([place])   . Hypertension   . Tinnitus      Family History  Problem Relation Age of Onset  . Diabetes Mother   . Hypertension Father      Current Outpatient Medications:  .  acetaminophen (TYLENOL) 500 MG tablet, Take 1,000 mg by mouth every 6 (six) hours as needed for mild pain, moderate pain or headache. Reported on 05/10/2016, Disp: , Rfl:  .  Alcohol Swabs (ALCOHOL WIPES) 70 % PADS, by Does not apply route. Use with blood sugar check and injection of insulin, Disp: , Rfl:  .  blood glucose meter kit and supplies KIT, Dispense based on patient and insurance preference. Use up to four times daily as directed. (FOR ICD-9 250.00, 250.01)., Disp: 1 each, Rfl: 0 .  Calcium 600-200  MG-UNIT tablet, Take 1 tablet by mouth daily., Disp: , Rfl:  .  Cholecalciferol (VITAMIN D) 50 MCG (2000 UT) tablet, Take 2,000 Units by mouth daily. , Disp: , Rfl:  .  metFORMIN (GLUCOPHAGE) 500 MG tablet, TAKE 1 TABLET BY MOUTH 2 TIMES EVERY DAY WITH MORNING AND EVENING MEALS, Disp: 90 tablet, Rfl: 1 .  Multiple Vitamin (MULTIVITAMIN WITH MINERALS) TABS tablet, Take 1 tablet by mouth daily. Centrum, Disp: , Rfl:  .  olmesartan (BENICAR) 20 MG tablet, TAKE 1 TABLET (20 MG TOTAL) BY MOUTH DAILY., Disp: 30 tablet, Rfl: 1 .  EPINEPHrine (EPIPEN 2-PAK) 0.3 mg/0.3 mL IJ SOAJ injection, Inject 0.3 mLs (0.3 mg total) into the muscle once as needed (for severe allergic reaction). CAll 911 immediately if you have to use this medicine (Patient not taking: Reported on 05/09/2019), Disp: 1 Device, Rfl: 1   Allergies  Allergen Reactions  . Aspirin Other (See Comments)    insomnia  . Hydroxyzine Other (See Comments)    insomnia  . Valium [Diazepam] Other (See Comments)    Insomnia, 05/10/16 pt states she took as young person- made her depressed     Review of Systems  Constitutional: Negative.   Respiratory: Negative.   Cardiovascular: Negative.  Negative for chest pain, palpitations and leg swelling.  Neurological: Negative for dizziness and headaches.     Today's Vitals   05/09/19 1124  BP: 122/72  Pulse:  80  Temp: 98.2 F (36.8 C)  TempSrc: Oral  Weight: 188 lb 12.8 oz (85.6 kg)  Height: 5' 3.4" (1.61 m)  PainSc: 0-No pain   Body mass index is 33.02 kg/m.   Objective:  Physical Exam Constitutional:      Appearance: Normal appearance.  Cardiovascular:     Rate and Rhythm: Normal rate and regular rhythm.     Pulses: Normal pulses.     Heart sounds: Normal heart sounds. No murmur.  Pulmonary:     Effort: Pulmonary effort is normal.     Breath sounds: Normal breath sounds.  Skin:    General: Skin is warm and dry.     Capillary Refill: Capillary refill takes less than 2 seconds.   Neurological:     General: No focal deficit present.     Mental Status: She is alert and oriented to person, place, and time.  Psychiatric:        Mood and Affect: Mood normal.        Behavior: Behavior normal.        Thought Content: Thought content normal.        Judgment: Judgment normal.         Assessment And Plan:     1. Type 2 diabetes mellitus without complication, without long-term current use of insulin (HCC)  Chronic, better controlled  Continue with current medications  Encouraged to limit intake of sugary foods and drinks  Encouraged to increase physical activity to 150 minutes per week as tolerated  I will refer to ophthalmology for diabetic eye exam - Ambulatory referral to Ophthalmology - Hemoglobin A1c - BMP8+eGFR; Future  2. Essential hypertension . B/P is well controlled.  [name] BMP ordered to check renal function.  . The importance of regular exercise and dietary modification was stressed to the patient.  . Stressed importance of losing ten percent of her body weight to help with B/P control.  - BMP8+eGFR; Future  3. Decreased estrogen level  - DG Bone Density; Future  4. Encounter for screening colonoscopy  According to USPTF Colorectal cancer Screening guidelines. Colonoscopy is recommended every 10 years, starting at age 66years.  Will refer to GI for colon cancer screening. - Ambulatory referral to Gastroenterology        [name], FNP    THE PATIENT IS ENCOURAGED TO PRACTICE SOCIAL DISTANCING DUE TO THE COVID-19 PANDEMIC.

## 2019-05-15 ENCOUNTER — Other Ambulatory Visit: Payer: Self-pay | Admitting: Nurse Practitioner

## 2019-05-21 ENCOUNTER — Ambulatory Visit (INDEPENDENT_AMBULATORY_CARE_PROVIDER_SITE_OTHER): Payer: Medicare Other | Admitting: Pharmacist

## 2019-05-21 DIAGNOSIS — H9311 Tinnitus, right ear: Secondary | ICD-10-CM | POA: Insufficient documentation

## 2019-05-21 DIAGNOSIS — E119 Type 2 diabetes mellitus without complications: Secondary | ICD-10-CM

## 2019-05-21 DIAGNOSIS — H9313 Tinnitus, bilateral: Secondary | ICD-10-CM | POA: Insufficient documentation

## 2019-05-21 DIAGNOSIS — H90A21 Sensorineural hearing loss, unilateral, right ear, with restricted hearing on the contralateral side: Secondary | ICD-10-CM | POA: Insufficient documentation

## 2019-05-21 DIAGNOSIS — H903 Sensorineural hearing loss, bilateral: Secondary | ICD-10-CM | POA: Diagnosis not present

## 2019-05-21 DIAGNOSIS — I10 Essential (primary) hypertension: Secondary | ICD-10-CM | POA: Diagnosis not present

## 2019-05-21 NOTE — Progress Notes (Signed)
  Chronic Care Management   Visit Note  05/21/2019 Name: Allison Franco MRN: [id number] DOB: February 16, 1941  Referred by: [name], FNP Reason for referral : Chronic Care Management   Allison Franco is a 78 y.o. year old female who is a primary care patient of [name], [place]. The CCM team was consulted for assistance with chronic disease management and care coordination needs.   Review of patient status, including review of consultants reports, relevant laboratory and other test results, and collaboration with appropriate care team members and the patient's provider was performed as part of comprehensive patient evaluation and provision of chronic care management services.    I spoke with Allison Franco by telephone today.  Objective:   Goals Addressed            This Visit's Progress     Patient Stated   . I would like to continue managing my diabetes and blood pressure (pt-stated)       Current Barriers:  . Language Barriers . Financial Barriers  Pharmacist Clinical Goal(s):  [name] Over the next 90 days, patient will work with PharmD and PCP  to address needs related to optimized medication management of chronic conditions  Interventions: . Comprehensive medication review performed.  No changes have been made to medications.  Patient denies adverse events. . Advised patient to continue taking medications as prescribed and checking BGs every morning as directed by PCP. [name] Patient set up for continued pharmacy delivery through [facility] () . Patient continues to check BG every morning.  It ranges from 80-100 depending on her diet. Last A1c was 5.8 on 12/2018. Last PCP office visit was 6.11.20 . She states her blood pressure has also been at goal <130/80 . Reminded patient of audiology appt on 06/05/2019. She saw another ear specialist today.  Patient has been complaining of tinnitus (appears chronic per chart review-ED admit in Jan 2020).  Patient's current medications  reviewed for tinnitus risk.  Angiotensin receptor blockers (ARBs-pt on olmesartan) appear to carry a very rare tinnitus risk.   After further review, patient's other medications are very unlikely to cause tinnitus as well. . Discussed plans with patient for ongoing care management follow up and provided patient with direct contact information for care management team  Patient Self Care Activities:  . Self administers medications as prescribed . Attends all scheduled provider appointments . Calls pharmacy for medication refills  Please see past updates related to this goal by clicking on the "Past Updates" button in the selected goal          Plan:   The care management team will reach out to the patient again next month.  Regina Eck, PharmD, BCPS Clinical Pharmacist, [facility] [place]: [phone]

## 2019-05-21 NOTE — Patient Instructions (Signed)
Visit Information  Goals Addressed            This Visit's Progress     Patient Stated   . I would like to continue managing my diabetes and blood pressure (pt-stated)       Current Barriers:  . Language Barriers . Financial Barriers  Pharmacist Clinical Goal(s):  [name] Over the next 90 days, patient will work with PharmD and PCP  to address needs related to optimized medication management of chronic conditions  Interventions: . Comprehensive medication review performed.  No changes have been made to medications.  Patient denies adverse events. . Advised patient to continue taking medications as prescribed and checking BGs every morning as directed by PCP. [name] Patient set up for continued pharmacy delivery through [facility] ([place]) . Patient continues to check BG every morning.  It ranges from 80-100 depending on her diet. Last A1c was 5.8 on 12/2018. Last PCP office visit was 6.11.20 . She states her blood pressure has also been at goal <130/80 . Reminded patient of audiology appt on 06/05/2019. She saw another ear specialist today.  Patient has been complaining of tinnitus chronically.  Patient's current medications reviewed for tinnitus risk.  Angiotensin receptor blockers (ARBs-pt on olmesartan) appear to carry the same very rare tinnitus risk.  All of patient's other medications are very unlikely to cause tinnitus as well. . Discussed plans with patient for ongoing care management follow up and provided patient with direct contact information for care management team  Patient Self Care Activities:  . Self administers medications as prescribed . Attends all scheduled provider appointments . Calls pharmacy for medication refills  Please see past updates related to this goal by clicking on the "Past Updates" button in the selected goal         The patient verbalized understanding of instructions provided today and declined a print copy of patient instruction materials.    The care management team will reach out to the patient again next month.  Encouraged patient to contact CCM team or PCP office if needs arise.  Regina Eck, PharmD, BCPS Clinical Pharmacist, [facility] [place]: [phone]

## 2019-05-24 ENCOUNTER — Other Ambulatory Visit: Payer: Self-pay | Admitting: Internal Medicine

## 2019-05-24 MED FILL — metFORMIN HCL 500 MG TABS: 500 | 45 days supply | Qty: 90 | Fill #0

## 2019-05-25 ENCOUNTER — Other Ambulatory Visit: Payer: Self-pay | Admitting: Internal Medicine

## 2019-05-27 DIAGNOSIS — Z1211 Encounter for screening for malignant neoplasm of colon: Secondary | ICD-10-CM | POA: Diagnosis not present

## 2019-05-27 DIAGNOSIS — K5900 Constipation, unspecified: Secondary | ICD-10-CM | POA: Diagnosis not present

## 2019-05-27 DIAGNOSIS — I10 Essential (primary) hypertension: Secondary | ICD-10-CM | POA: Diagnosis not present

## 2019-05-30 ENCOUNTER — Encounter: Payer: Self-pay | Admitting: Nurse Practitioner

## 2019-05-30 ENCOUNTER — Telehealth: Payer: Self-pay

## 2019-06-03 MED FILL — metFORMIN HCL 500 MG TABS: 500 | 45 days supply | Qty: 90 | Fill #0

## 2019-06-03 MED FILL — OLMESARTAN MEDOXOMIL 20 MG: 20 | 30 days supply | Qty: 30 | Fill #0

## 2019-06-06 ENCOUNTER — Ambulatory Visit: Payer: Self-pay

## 2019-06-06 DIAGNOSIS — I10 Essential (primary) hypertension: Secondary | ICD-10-CM

## 2019-06-06 DIAGNOSIS — E119 Type 2 diabetes mellitus without complications: Secondary | ICD-10-CM

## 2019-06-06 NOTE — Chronic Care Management (AMB) (Signed)
  Chronic Care Management   Outreach Note  06/06/2019 Name: Allison Franco MRN: [id number] DOB: Dec 06, 1940  Referred by: [name], FNP Reason for referral : Chronic Care Management (#2 INITIAL CCM RNCM Telephone )   A second unsuccessful telephone outreach was attempted today. The patient was referred to the case management team for assistance with chronic care management and care coordination.   Follow Up Plan: A HIPPA compliant phone message was left for the patient providing contact information and requesting a return call.   Barb Merino, RN, BSN, CCM Care Management Coordinator [place] [facility]  Direct Phone: [phone]

## 2019-06-25 ENCOUNTER — Telehealth: Payer: Self-pay

## 2019-06-27 MED FILL — OLMESARTAN MEDOXOMIL 20 MG: 20 | 30 days supply | Qty: 30 | Fill #1

## 2019-07-24 ENCOUNTER — Telehealth: Payer: Medicare Other

## 2019-07-24 ENCOUNTER — Ambulatory Visit: Payer: Self-pay

## 2019-07-24 DIAGNOSIS — E119 Type 2 diabetes mellitus without complications: Secondary | ICD-10-CM

## 2019-07-24 DIAGNOSIS — Z23 Encounter for immunization: Secondary | ICD-10-CM | POA: Diagnosis not present

## 2019-07-24 DIAGNOSIS — I10 Essential (primary) hypertension: Secondary | ICD-10-CM

## 2019-07-24 NOTE — Chronic Care Management (AMB) (Signed)
  Chronic Care Management   Outreach Note  07/24/2019 Name: Allison Franco MRN: [id number] DOB: 12-18-1940  Referred by: [name], FNP Reason for referral : Chronic Care Management (#3 CCM RNCM Telephone Outreach )   Third unsuccessful telephone outreach was attempted today. The patient was referred to the case management team for assistance with chronic care management and care coordination. The patient's primary care provider has been notified of our unsuccessful attempts to make or maintain contact with the patient. The care management team is pleased to engage with this patient at any time in the future should he/she be interested in assistance from the care management team.   Follow Up Plan: A HIPPA compliant phone message was left for the patient providing contact information and requesting a return call. The care management team is available to follow up with the patient after provider conversation with the patient regarding recommendation for care management engagement and subsequent re-referral to the care management team. If patient returns call to provider office, please advise to call [facility] Care Management RN Barb Merino [phone].    Barb Merino, RN, BSN, CCM Care Management Coordinator [facility]  Direct Phone: [phone]

## 2019-07-27 ENCOUNTER — Other Ambulatory Visit: Payer: Self-pay | Admitting: Nurse Practitioner

## 2019-07-30 MED FILL — OLMESARTAN MEDOXOMIL 20 MG: 20 | 30 days supply | Qty: 30 | Fill #0

## 2019-08-06 ENCOUNTER — Encounter (HOSPITAL_COMMUNITY): Payer: Self-pay

## 2019-08-06 ENCOUNTER — Other Ambulatory Visit: Payer: Self-pay

## 2019-08-06 ENCOUNTER — Inpatient Hospital Stay (HOSPITAL_COMMUNITY)
Admission: EM | Admit: 2019-08-06 | Discharge: 2019-08-09 | DRG: 352 | Disposition: A | Discharge status: Home Health | Payer: Medicare Other | Attending: Surgery | Admitting: Surgery

## 2019-08-06 DIAGNOSIS — E119 Type 2 diabetes mellitus without complications: Secondary | ICD-10-CM | POA: Diagnosis present

## 2019-08-06 DIAGNOSIS — K4030 Unilateral inguinal hernia, with obstruction, without gangrene, not specified as recurrent: Principal | ICD-10-CM | POA: Diagnosis present

## 2019-08-06 DIAGNOSIS — Z20828 Contact with and (suspected) exposure to other viral communicable diseases: Secondary | ICD-10-CM | POA: Diagnosis present

## 2019-08-06 DIAGNOSIS — R1013 Epigastric pain: Secondary | ICD-10-CM | POA: Diagnosis not present

## 2019-08-06 DIAGNOSIS — R1084 Generalized abdominal pain: Secondary | ICD-10-CM | POA: Diagnosis not present

## 2019-08-06 DIAGNOSIS — Z03818 Encounter for observation for suspected exposure to other biological agents ruled out: Secondary | ICD-10-CM | POA: Diagnosis not present

## 2019-08-06 DIAGNOSIS — Z90710 Acquired absence of both cervix and uterus: Secondary | ICD-10-CM | POA: Diagnosis not present

## 2019-08-06 DIAGNOSIS — Z833 Family history of diabetes mellitus: Secondary | ICD-10-CM

## 2019-08-06 DIAGNOSIS — I10 Essential (primary) hypertension: Secondary | ICD-10-CM | POA: Diagnosis not present

## 2019-08-06 DIAGNOSIS — Z6831 Body mass index (BMI) 31.0-31.9, adult: Secondary | ICD-10-CM

## 2019-08-06 DIAGNOSIS — R0902 Hypoxemia: Secondary | ICD-10-CM | POA: Diagnosis not present

## 2019-08-06 DIAGNOSIS — R0689 Other abnormalities of breathing: Secondary | ICD-10-CM | POA: Diagnosis not present

## 2019-08-06 DIAGNOSIS — E669 Obesity, unspecified: Secondary | ICD-10-CM | POA: Diagnosis present

## 2019-08-06 DIAGNOSIS — Z79899 Other long term (current) drug therapy: Secondary | ICD-10-CM

## 2019-08-06 DIAGNOSIS — Z7984 Long term (current) use of oral hypoglycemic drugs: Secondary | ICD-10-CM

## 2019-08-06 LAB — CBC WITH DIFFERENTIAL/PLATELET
Abs Immature Granulocytes: 0.02 10*3/uL (ref 0.00–0.07)
Basophils Absolute: 0 10*3/uL (ref 0.0–0.1)
Basophils Relative: 0 %
Eosinophils Absolute: 0.1 10*3/uL (ref 0.0–0.5)
Eosinophils Relative: 2 %
HCT: 41.6 % (ref 36.0–46.0)
Hemoglobin: 13.2 g/dL (ref 12.0–15.0)
Immature Granulocytes: 0 %
Lymphocytes Relative: 16 %
Lymphs Abs: 1.2 10*3/uL (ref 0.7–4.0)
MCH: 28.5 pg (ref 26.0–34.0)
MCHC: 31.7 g/dL (ref 30.0–36.0)
MCV: 89.8 fL (ref 80.0–100.0)
Monocytes Absolute: 0.4 10*3/uL (ref 0.1–1.0)
Monocytes Relative: 6 %
Neutro Abs: 5.8 10*3/uL (ref 1.7–7.7)
Neutrophils Relative %: 76 %
Platelets: 237 10*3/uL (ref 150–400)
RBC: 4.63 MIL/uL (ref 3.87–5.11)
RDW: 12.1 % (ref 11.5–15.5)
WBC: 7.6 10*3/uL (ref 4.0–10.5)
nRBC: 0 % (ref 0.0–0.2)

## 2019-08-06 MED ORDER — PANTOPRAZOLE SODIUM 40 MG IV SOLR
40.0000 mg | Freq: Once | INTRAVENOUS | Status: AC
  Administered 2019-08-07: 40 mg via INTRAVENOUS
  Filled 2019-08-06: qty 40

## 2019-08-06 NOTE — ED Triage Notes (Signed)
Per ems: Pt coming from home c/o epigastric pain that started at 2030 after eating supper. Emesis x1. No hx of gerd.   20 L hand  4 Mg zofran 100 mcg fentanyl  Pain from 10 to 2.  ekg unremarkable.    215 cbg 138/69 18 rr 95% 87 hr

## 2019-08-06 NOTE — ED Provider Notes (Signed)
[facility] DEPT Provider Note: Georgena Spurling, MD, FACEP  CSN: [id number] MRN: [id number] ARRIVAL: 08/06/19 at 2258 ROOM: [place]  Abdominal Pain   HISTORY OF PRESENT ILLNESS  08/06/19 11:22 PM Rupal Childress is a 78 y.o. female who developed epigastric and lower abdominal pain beginning about 8:30 PM after eating dinner.  She rated her pain as a 10 out of 10 initially.  She was given 4 mg of Zofran and 100 mcg of fentanyl IV by EMS and her pain is now at 2 out of 10.  She had one episode of vomiting, no diarrhea or fever.  She is also complaining of her abdomen being distended.   Past Medical History:  Diagnosis Date   Anxiety    Constipation    Depression    "following husband's death"   Diabetes mellitus without complication ([place])    Hypertension    Tinnitus     Past Surgical History:  Procedure Laterality Date   ABDOMINAL HYSTERECTOMY     fribroid surgery      Family History  Problem Relation Age of Onset   Diabetes Mother    Hypertension Father     Social History   Tobacco Use   Smoking status: Never Smoker   Smokeless tobacco: Never Used  Substance Use Topics   Alcohol use: No   Drug use: No    Prior to Admission medications   Medication Sig Start Date End Date Taking? Authorizing Provider  acetaminophen (TYLENOL) 500 MG tablet Take 1,000 mg by mouth every 6 (six) hours as needed for mild pain, moderate pain or headache. Reported on 05/10/2016   Yes [provider]  Calcium 600-200 MG-UNIT tablet Take 1 tablet by mouth daily.   Yes [provider]  Cholecalciferol (VITAMIN D) 50 MCG (2000 UT) tablet Take 2,000 Units by mouth daily.    Yes [provider]  metFORMIN (GLUCOPHAGE) 500 MG tablet TAKE 1 TABLET BY MOUTH 2 TIMES EVERY DAY WITH MORNING AND EVENING MEALS Patient taking differently: Take 500 mg by mouth 2 (two) times daily with a meal. Give w/food. 05/27/19  Yes [name], FNP    Multiple Vitamin (MULTIVITAMIN WITH MINERALS) TABS tablet Take 1 tablet by mouth daily. Centrum   Yes [provider]  olmesartan (BENICAR) 20 MG tablet TAKE 1 TABLET BY MOUTH ONCE DAILY Patient taking differently: Take 20 mg by mouth daily.  07/29/19  Yes [name], FNP  Alcohol Swabs (ALCOHOL WIPES) 70 % PADS by Does not apply route. Use with blood sugar check and injection of insulin    [provider]  blood glucose meter kit and supplies KIT Dispense based on patient and insurance preference. Use up to four times daily as directed. (FOR ICD-9 250.00, 250.01). 07/05/18   Florencia Reasons, MD  EPINEPHrine (EPIPEN 2-PAK) 0.3 mg/0.3 mL IJ SOAJ injection Inject 0.3 mLs (0.3 mg total) into the muscle once as needed (for severe allergic reaction). CAll 911 immediately if you have to use this medicine Patient not taking: Reported on 05/09/2019 09/10/17   Larene Pickett, PA-C    Allergies Aspirin, Hydroxyzine, and Valium [diazepam]   REVIEW OF SYSTEMS  Negative except as noted here or in the History of Present Illness.   PHYSICAL EXAMINATION  Initial Vital Signs Blood pressure 125/76, pulse 64, temperature 98.3 F (36.8 C), temperature source Oral, resp. rate 18, height _0  (1.6 m), weight 81.6 kg, SpO2 95 %.  Examination General: Well-developed, well-nourished female  in no acute distress; appearance consistent with age of record HENT: normocephalic; atraumatic Eyes: Arcus senilis bilaterally Neck: supple Heart: regular rate and rhythm Lungs: clear to auscultation bilaterally Abdomen: soft; mildly distended; diffuse tenderness which is not severe; nontender, subtle left inguinal mass; bowel sounds hypoactive Extremities: No deformity; full range of motion; pulses normal Neurologic: Awake, alert and oriented; motor function intact in all extremities and symmetric; no facial droop Skin: Warm and dry Psychiatric: Normal mood and affect   RESULTS  Summary of this visit's  results, reviewed by myself:   EKG Interpretation  Date/Time:    Ventricular Rate:    PR Interval:    QRS Duration:   QT Interval:    QTC Calculation:   R Axis:     Text Interpretation:        Laboratory Studies: Results for orders placed or performed during the hospital encounter of 08/06/19 (from the past 24 hour(s))  Lipase, blood     Status: None   Collection Time: 08/06/19 11:12 PM  Result Value Ref Range   Lipase 27 11 - 51 U/L  Comprehensive metabolic panel     Status: Abnormal   Collection Time: 08/06/19 11:12 PM  Result Value Ref Range   Sodium 134 (L) 135 - 145 mmol/L   Potassium 3.9 3.5 - 5.1 mmol/L   Chloride 97 (L) 98 - 111 mmol/L   CO2 29 22 - 32 mmol/L   Glucose, Bld 210 (H) 70 - 99 mg/dL   BUN 16 8 - 23 mg/dL   Creatinine, Ser 0.76 0.44 - 1.00 mg/dL   Calcium 8.8 (L) 8.9 - 10.3 mg/dL   Total Protein 7.3 6.5 - 8.1 g/dL   Albumin 3.8 3.5 - 5.0 g/dL   AST 20 15 - 41 U/L   ALT 24 0 - 44 U/L   Alkaline Phosphatase 87 38 - 126 U/L   Total Bilirubin 0.7 0.3 - 1.2 mg/dL   GFR calc non Af Amer >60 >60 mL/min   GFR calc Af Amer >60 >60 mL/min   Anion gap 8 5 - 15  CBC with Differential/Platelet     Status: None   Collection Time: 08/06/19 11:28 PM  Result Value Ref Range   WBC 7.6 4.0 - 10.5 K/uL   RBC 4.63 3.87 - 5.11 MIL/uL   Hemoglobin 13.2 12.0 - 15.0 g/dL   HCT 41.6 36.0 - 46.0 %   MCV 89.8 80.0 - 100.0 fL   MCH 28.5 26.0 - 34.0 pg   MCHC 31.7 30.0 - 36.0 g/dL   RDW 12.1 11.5 - 15.5 %   Platelets 237 150 - 400 K/uL   nRBC 0.0 0.0 - 0.2 %   Neutrophils Relative % 76 %   Neutro Abs 5.8 1.7 - 7.7 K/uL   Lymphocytes Relative 16 %   Lymphs Abs 1.2 0.7 - 4.0 K/uL   Monocytes Relative 6 %   Monocytes Absolute 0.4 0.1 - 1.0 K/uL   Eosinophils Relative 2 %   Eosinophils Absolute 0.1 0.0 - 0.5 K/uL   Basophils Relative 0 %   Basophils Absolute 0.0 0.0 - 0.1 K/uL   Immature Granulocytes 0 %   Abs Immature Granulocytes 0.02 0.00 - 0.07 K/uL    Urinalysis, Routine w reflex microscopic     Status: Abnormal   Collection Time: 08/06/19 11:45 PM  Result Value Ref Range   Color, Urine YELLOW YELLOW   APPearance CLEAR CLEAR   Specific Gravity, Urine 1.019 1.005 - 1.030  pH 7.0 5.0 - 8.0   Glucose, UA NEGATIVE NEGATIVE mg/dL   Hgb urine dipstick NEGATIVE NEGATIVE   Bilirubin Urine NEGATIVE NEGATIVE   Ketones, ur NEGATIVE NEGATIVE mg/dL   Protein, ur NEGATIVE NEGATIVE mg/dL   Nitrite NEGATIVE NEGATIVE   Leukocytes,Ua SMALL (A) NEGATIVE   RBC / HPF 0-5 0 - 5 RBC/hpf   WBC, UA 6-10 0 - 5 WBC/hpf   Bacteria, UA NONE SEEN NONE SEEN   Squamous Epithelial / LPF 0-5 0 - 5   Imaging Studies: Ct Abdomen Pelvis W Contrast  Result Date: 08/07/2019 CLINICAL DATA:  Diffuse postprandial abdominal pain. EXAM: CT ABDOMEN AND PELVIS WITH CONTRAST TECHNIQUE: Multidetector CT imaging of the abdomen and pelvis was performed using the standard protocol following bolus administration of intravenous contrast. CONTRAST:  137m OMNIPAQUE IOHEXOL 300 MG/ML  SOLN COMPARISON:  01/04/2019 FINDINGS: Lower chest: Lung bases are clear. No effusions. Heart is normal size. Hepatobiliary: No focal hepatic abnormality. Gallbladder unremarkable. Pancreas: No focal abnormality or ductal dilatation. Spleen: No focal abnormality.  Normal size. Adrenals/Urinary Tract: No adrenal abnormality. No focal renal abnormality. No stones or hydronephrosis. Urinary bladder is unremarkable. Stomach/Bowel: Dilated fluid-filled small bowel loops noted which extend into the pelvis where there is a left inguinal hernia containing a small bowel loop which appears to be the cause of the small bowel obstruction. Distal small bowel is decompressed. Appendix is normal. Stomach is dilated and fluid-filled. Large bowel decompressed. Vascular/Lymphatic: No evidence of aneurysm or adenopathy. Aortic atherosclerosis Reproductive: No abnormality visualized. Other: Small amount of free fluid in the pelvis.   No free air. Musculoskeletal: No acute bony abnormality. IMPRESSION: Left inguinal hernia containing a small bowel loop which causes small bowel obstruction. Small amount of free fluid in the pelvis. Aortic atherosclerosis. Electronically Signed   By: KRolm BaptiseM.D.   On: 08/07/2019 01:16    ED COURSE and MDM  Nursing notes and initial vitals signs, including pulse oximetry, reviewed.  Vitals:   08/06/19 2307 08/06/19 2315  BP:  125/76  Pulse:  64  Resp:  18  Temp:  98.3 F (36.8 C)  TempSrc:  Oral  SpO2: 95% 95%  Weight:  81.6 kg  Height:  _0  (1.6 m)   2:26 AM NG tube ordered for small bowel obstruction.  Patient made n.p.o.  Dr. BNinfa Lindenof general surgery to admit patient.   PROCEDURES    ED DIAGNOSES     ICD-10-CM   1. Inguinal hernia of left side with obstruction  K40.30        , JJenny Reichmann MD 08/07/19 [phone]

## 2019-08-07 ENCOUNTER — Encounter (HOSPITAL_COMMUNITY): Admission: EM | Disposition: A | Discharge status: Home Health | Payer: Self-pay | Source: Home / Self Care

## 2019-08-07 ENCOUNTER — Emergency Department (HOSPITAL_COMMUNITY): Payer: Medicare Other

## 2019-08-07 ENCOUNTER — Encounter (HOSPITAL_COMMUNITY): Payer: Self-pay

## 2019-08-07 ENCOUNTER — Inpatient Hospital Stay (HOSPITAL_COMMUNITY): Payer: Medicare Other

## 2019-08-07 ENCOUNTER — Inpatient Hospital Stay (HOSPITAL_COMMUNITY): Payer: Medicare Other | Admitting: Anesthesiology

## 2019-08-07 DIAGNOSIS — K5669 Other partial intestinal obstruction: Secondary | ICD-10-CM | POA: Diagnosis not present

## 2019-08-07 DIAGNOSIS — Z6831 Body mass index (BMI) 31.0-31.9, adult: Secondary | ICD-10-CM | POA: Diagnosis not present

## 2019-08-07 DIAGNOSIS — E669 Obesity, unspecified: Secondary | ICD-10-CM | POA: Diagnosis present

## 2019-08-07 DIAGNOSIS — K4030 Unilateral inguinal hernia, with obstruction, without gangrene, not specified as recurrent: Secondary | ICD-10-CM | POA: Diagnosis present

## 2019-08-07 DIAGNOSIS — E119 Type 2 diabetes mellitus without complications: Secondary | ICD-10-CM | POA: Diagnosis present

## 2019-08-07 DIAGNOSIS — Z7984 Long term (current) use of oral hypoglycemic drugs: Secondary | ICD-10-CM | POA: Diagnosis not present

## 2019-08-07 DIAGNOSIS — Z79899 Other long term (current) drug therapy: Secondary | ICD-10-CM | POA: Diagnosis not present

## 2019-08-07 DIAGNOSIS — D649 Anemia, unspecified: Secondary | ICD-10-CM | POA: Diagnosis not present

## 2019-08-07 DIAGNOSIS — Z90710 Acquired absence of both cervix and uterus: Secondary | ICD-10-CM | POA: Diagnosis not present

## 2019-08-07 DIAGNOSIS — Z833 Family history of diabetes mellitus: Secondary | ICD-10-CM | POA: Diagnosis not present

## 2019-08-07 DIAGNOSIS — Z20828 Contact with and (suspected) exposure to other viral communicable diseases: Secondary | ICD-10-CM | POA: Diagnosis present

## 2019-08-07 DIAGNOSIS — Z4682 Encounter for fitting and adjustment of non-vascular catheter: Secondary | ICD-10-CM | POA: Diagnosis not present

## 2019-08-07 DIAGNOSIS — E1165 Type 2 diabetes mellitus with hyperglycemia: Secondary | ICD-10-CM | POA: Diagnosis not present

## 2019-08-07 DIAGNOSIS — I10 Essential (primary) hypertension: Secondary | ICD-10-CM | POA: Diagnosis present

## 2019-08-07 HISTORY — PX: COLON RESECTION: SHX5231

## 2019-08-07 LAB — URINALYSIS, ROUTINE W REFLEX MICROSCOPIC
Bacteria, UA: NONE SEEN
Bilirubin Urine: NEGATIVE
Glucose, UA: NEGATIVE mg/dL
Hgb urine dipstick: NEGATIVE
Ketones, ur: NEGATIVE mg/dL
Nitrite: NEGATIVE
Protein, ur: NEGATIVE mg/dL
Specific Gravity, Urine: 1.019 (ref 1.005–1.030)
pH: 7 (ref 5.0–8.0)

## 2019-08-07 LAB — COMPREHENSIVE METABOLIC PANEL
ALT: 24 U/L (ref 0–44)
AST: 20 U/L (ref 15–41)
Albumin: 3.8 g/dL (ref 3.5–5.0)
Alkaline Phosphatase: 87 U/L (ref 38–126)
Anion gap: 8 (ref 5–15)
BUN: 16 mg/dL (ref 8–23)
CO2: 29 mmol/L (ref 22–32)
Calcium: 8.8 mg/dL — ABNORMAL LOW (ref 8.9–10.3)
Chloride: 97 mmol/L — ABNORMAL LOW (ref 98–111)
Creatinine, Ser: 0.76 mg/dL (ref 0.44–1.00)
GFR calc Af Amer: >60 mL/min (ref >60)
GFR calc non Af Amer: >60 mL/min (ref >60)
Glucose, Bld: 210 mg/dL — ABNORMAL HIGH (ref 70–99)
Potassium: 3.9 mmol/L (ref 3.5–5.1)
Sodium: 134 mmol/L — ABNORMAL LOW (ref 135–145)
Total Bilirubin: 0.7 mg/dL (ref 0.3–1.2)
Total Protein: 7.3 g/dL (ref 6.5–8.1)

## 2019-08-07 LAB — GLUCOSE, CAPILLARY
Glucose-Capillary: 157 mg/dL — ABNORMAL HIGH (ref 70–99)
Glucose-Capillary: 82 mg/dL (ref 70–99)
Glucose-Capillary: 150 mg/dL — ABNORMAL HIGH (ref 70–99)
Glucose-Capillary: 178 mg/dL — ABNORMAL HIGH (ref 70–99)

## 2019-08-07 LAB — LIPASE, BLOOD: Lipase: 27 U/L (ref 11–51)

## 2019-08-07 LAB — SARS CORONAVIRUS 2 BY RT PCR (HOSPITAL ORDER, PERFORMED IN ~~LOC~~ HOSPITAL LAB): SARS Coronavirus 2: NEGATIVE

## 2019-08-07 SURGERY — COLON RESECTION LAPAROSCOPIC
Anesthesia: General

## 2019-08-07 MED ORDER — BUPIVACAINE LIPOSOME 1.3 % IJ SUSP
20.0000 mL | Freq: Once | INTRAMUSCULAR | Status: DC
  Filled 2019-08-07: qty 20

## 2019-08-07 MED ORDER — ONDANSETRON HCL 4 MG/2ML IJ SOLN: 4.0000 mg | Freq: Four times a day (QID) | INTRAMUSCULAR | Status: DC | PRN

## 2019-08-07 MED ORDER — SUCCINYLCHOLINE CHLORIDE 200 MG/10ML IV SOSY
PREFILLED_SYRINGE | INTRAVENOUS | Status: DC | PRN
  Administered 2019-08-07: 100 mg via INTRAVENOUS

## 2019-08-07 MED ORDER — INSULIN ASPART 100 UNIT/ML ~~LOC~~ SOLN
0.0000 [IU] | SUBCUTANEOUS | Status: DC
  Administered 2019-08-07 (×2): 4 [IU] via SUBCUTANEOUS
  Administered 2019-08-08 (×2): 3 [IU] via SUBCUTANEOUS
  Administered 2019-08-08: 12:00:00 4 [IU] via SUBCUTANEOUS

## 2019-08-07 MED ORDER — FENTANYL CITRATE (PF) 250 MCG/5ML IJ SOLN
INTRAMUSCULAR | Status: AC
  Filled 2019-08-07: qty 5

## 2019-08-07 MED ORDER — ONDANSETRON HCL 4 MG/2ML IJ SOLN
4.0000 mg | Freq: Four times a day (QID) | INTRAMUSCULAR | Status: DC | PRN
  Administered 2019-08-07: 4 mg via INTRAVENOUS

## 2019-08-07 MED ORDER — MORPHINE SULFATE (PF) 2 MG/ML IV SOLN: 1.0000 mg | INTRAVENOUS | Status: DC | PRN

## 2019-08-07 MED ORDER — SUCCINYLCHOLINE CHLORIDE 200 MG/10ML IV SOSY
PREFILLED_SYRINGE | INTRAVENOUS | Status: AC
  Filled 2019-08-07: qty 10

## 2019-08-07 MED ORDER — ROCURONIUM BROMIDE 10 MG/ML (PF) SYRINGE
PREFILLED_SYRINGE | INTRAVENOUS | Status: AC
  Filled 2019-08-07: qty 10

## 2019-08-07 MED ORDER — MEPERIDINE HCL 50 MG/ML IJ SOLN: 6.2500 mg | INTRAMUSCULAR | Status: DC | PRN

## 2019-08-07 MED ORDER — ONDANSETRON HCL 4 MG/2ML IJ SOLN
4.0000 mg | Freq: Once | INTRAMUSCULAR | Status: AC
  Administered 2019-08-07: 4 mg via INTRAVENOUS
  Filled 2019-08-07: qty 2

## 2019-08-07 MED ORDER — ROCURONIUM BROMIDE 10 MG/ML (PF) SYRINGE
PREFILLED_SYRINGE | INTRAVENOUS | Status: DC | PRN
  Administered 2019-08-07: 50 mg via INTRAVENOUS

## 2019-08-07 MED ORDER — PROPOFOL 10 MG/ML IV BOLUS
INTRAVENOUS | Status: DC | PRN
  Administered 2019-08-07: 150 mg via INTRAVENOUS

## 2019-08-07 MED ORDER — PHENOL 1.4 % MT LIQD
1.0000 | OROMUCOSAL | Status: DC | PRN
  Filled 2019-08-07: qty 177

## 2019-08-07 MED ORDER — MORPHINE SULFATE (PF) 2 MG/ML IV SOLN
1.0000 mg | INTRAVENOUS | Status: DC | PRN
  Administered 2019-08-07 – 2019-08-08 (×4): 2 mg via INTRAVENOUS
  Filled 2019-08-07 (×5): qty 1

## 2019-08-07 MED ORDER — SODIUM CHLORIDE (PF) 0.9 % IJ SOLN
INTRAMUSCULAR | Status: AC
  Administered 2019-08-07: 07:00:00
  Filled 2019-08-07: qty 50

## 2019-08-07 MED ORDER — HYDROCODONE-ACETAMINOPHEN 5-325 MG PO TABS
1.0000 | ORAL_TABLET | ORAL | Status: DC | PRN
  Administered 2019-08-08: 1 via ORAL
  Administered 2019-08-08: 2 via ORAL
  Administered 2019-08-08: 1 via ORAL
  Administered 2019-08-09: 2 via ORAL
  Filled 2019-08-07 (×2): qty 2
  Filled 2019-08-07 (×2): qty 1
  Filled 2019-08-07: qty 2

## 2019-08-07 MED ORDER — PROMETHAZINE HCL 25 MG/ML IJ SOLN: 6.2500 mg | INTRAMUSCULAR | Status: DC | PRN

## 2019-08-07 MED ORDER — ONDANSETRON 4 MG PO TBDP: 4.0000 mg | ORAL_TABLET | Freq: Four times a day (QID) | ORAL | Status: DC | PRN

## 2019-08-07 MED ORDER — ACETAMINOPHEN 10 MG/ML IV SOLN
INTRAVENOUS | Status: AC
  Filled 2019-08-07: qty 100

## 2019-08-07 MED ORDER — LIDOCAINE 2% (20 MG/ML) 5 ML SYRINGE
INTRAMUSCULAR | Status: DC | PRN
  Administered 2019-08-07: 60 mg via INTRAVENOUS

## 2019-08-07 MED ORDER — SODIUM CHLORIDE 0.9 % IV SOLN
2.0000 g | INTRAVENOUS | Status: AC
  Administered 2019-08-07: 2 g via INTRAVENOUS
  Filled 2019-08-07: qty 2

## 2019-08-07 MED ORDER — IOHEXOL 300 MG/ML  SOLN
100.0000 mL | Freq: Once | INTRAMUSCULAR | Status: AC | PRN
  Administered 2019-08-07: 100 mL via INTRAVENOUS

## 2019-08-07 MED ORDER — CHLORHEXIDINE GLUCONATE CLOTH 2 % EX PADS: 6.0000 | MEDICATED_PAD | Freq: Once | CUTANEOUS | Status: DC

## 2019-08-07 MED ORDER — ACETAMINOPHEN 10 MG/ML IV SOLN
1000.0000 mg | Freq: Once | INTRAVENOUS | Status: DC | PRN
  Administered 2019-08-07: 1000 mg via INTRAVENOUS

## 2019-08-07 MED ORDER — LIDOCAINE 2% (20 MG/ML) 5 ML SYRINGE
INTRAMUSCULAR | Status: AC
  Filled 2019-08-07: qty 5

## 2019-08-07 MED ORDER — BUPIVACAINE LIPOSOME 1.3 % IJ SUSP
INTRAMUSCULAR | Status: DC | PRN
  Administered 2019-08-07: 16 mL

## 2019-08-07 MED ORDER — LACTATED RINGERS IV SOLN
INTRAVENOUS | Status: DC | PRN
  Administered 2019-08-07: 16:00:00 via INTRAVENOUS

## 2019-08-07 MED ORDER — FENTANYL CITRATE (PF) 100 MCG/2ML IJ SOLN
INTRAMUSCULAR | Status: DC | PRN
  Administered 2019-08-07: 100 ug via INTRAVENOUS
  Administered 2019-08-07 (×2): 50 ug via INTRAVENOUS

## 2019-08-07 MED ORDER — LABETALOL HCL 5 MG/ML IV SOLN
INTRAVENOUS | Status: AC
  Filled 2019-08-07: qty 4

## 2019-08-07 MED ORDER — FENTANYL CITRATE (PF) 100 MCG/2ML IJ SOLN
50.0000 ug | INTRAMUSCULAR | Status: DC | PRN
  Administered 2019-08-07 (×2): 50 ug via INTRAVENOUS
  Filled 2019-08-07 (×2): qty 2

## 2019-08-07 MED ORDER — METOPROLOL TARTRATE 5 MG/5ML IV SOLN: 5.0000 mg | Freq: Four times a day (QID) | INTRAVENOUS | Status: DC | PRN

## 2019-08-07 MED ORDER — POTASSIUM CHLORIDE IN NACL 20-0.9 MEQ/L-% IV SOLN
INTRAVENOUS | Status: DC
  Administered 2019-08-07 – 2019-08-08 (×2): via INTRAVENOUS
  Filled 2019-08-07 (×2): qty 1000

## 2019-08-07 MED ORDER — LABETALOL HCL 5 MG/ML IV SOLN
10.0000 mg | INTRAVENOUS | Status: DC | PRN
  Administered 2019-08-07: 10 mg via INTRAVENOUS

## 2019-08-07 MED ORDER — CHLORHEXIDINE GLUCONATE CLOTH 2 % EX PADS
6.0000 | MEDICATED_PAD | Freq: Once | CUTANEOUS | Status: AC
  Administered 2019-08-07: 11:00:00 6 via TOPICAL

## 2019-08-07 MED ORDER — SUGAMMADEX SODIUM 200 MG/2ML IV SOLN
INTRAVENOUS | Status: DC | PRN
  Administered 2019-08-07: 200 mg via INTRAVENOUS

## 2019-08-07 MED ORDER — SODIUM CHLORIDE 0.9 % IV SOLN
Freq: Once | INTRAVENOUS | Status: AC
  Administered 2019-08-07: 125 mL/h via INTRAVENOUS

## 2019-08-07 MED ORDER — HYDROMORPHONE HCL 1 MG/ML IJ SOLN: 0.2500 mg | INTRAMUSCULAR | Status: DC | PRN

## 2019-08-07 MED ORDER — ENOXAPARIN SODIUM 40 MG/0.4ML ~~LOC~~ SOLN
40.0000 mg | SUBCUTANEOUS | Status: DC
  Administered 2019-08-08 – 2019-08-09 (×2): 40 mg via SUBCUTANEOUS
  Filled 2019-08-07 (×2): qty 0.4

## 2019-08-07 SURGICAL SUPPLY — 61 items
APL PRP STRL LF DISP 70% ISPRP (MISCELLANEOUS)
APPLIER CLIP 5 13 M/L LIGAMAX5 (MISCELLANEOUS)
APPLIER CLIP ROT 10 11.4 M/L (STAPLE)
APR CLP MED LRG 11.4X10 (STAPLE)
APR CLP MED LRG 5 ANG JAW (MISCELLANEOUS)
BLADE EXTENDED COATED 6.5IN (ELECTRODE) IMPLANT
CABLE HIGH FREQUENCY MONO STRZ (ELECTRODE) ×1 IMPLANT
CELLS DAT CNTRL 66122 CELL SVR (MISCELLANEOUS) IMPLANT
CHLORAPREP W/TINT 26 (MISCELLANEOUS) IMPLANT
CLIP APPLIE 5 13 M/L LIGAMAX5 (MISCELLANEOUS) IMPLANT
CLIP APPLIE ROT 10 11.4 M/L (STAPLE) IMPLANT
COUNTER NEEDLE 20 DBL MAG RED (NEEDLE) ×3 IMPLANT
COVER MAYO STAND STRL (DRAPES) ×9 IMPLANT
COVER SURGICAL LIGHT HANDLE (MISCELLANEOUS) ×3 IMPLANT
COVER WAND RF STERILE (DRAPES) IMPLANT
DECANTER SPIKE VIAL GLASS SM (MISCELLANEOUS) IMPLANT
DRAIN PENROSE 18X1/2 LTX STRL (DRAIN) ×3 IMPLANT
DRAPE LAPAROSCOPIC ABDOMINAL (DRAPES) ×3 IMPLANT
DRSG OPSITE POSTOP 4X10 (GAUZE/BANDAGES/DRESSINGS) IMPLANT
DRSG OPSITE POSTOP 4X6 (GAUZE/BANDAGES/DRESSINGS) IMPLANT
DRSG OPSITE POSTOP 4X8 (GAUZE/BANDAGES/DRESSINGS) IMPLANT
ELECT REM PT RETURN 15FT ADLT (MISCELLANEOUS) ×3 IMPLANT
GAUZE SPONGE 4X4 12PLY STRL (GAUZE/BANDAGES/DRESSINGS) IMPLANT
GLOVE BIOGEL M 8.0 STRL (GLOVE) ×6 IMPLANT
GOWN STRL REUS W/TWL XL LVL3 (GOWN DISPOSABLE) ×18 IMPLANT
KIT TURNOVER KIT A (KITS) IMPLANT
LEGGING LITHOTOMY PAIR STRL (DRAPES) IMPLANT
MESH BARD SOFT 3X6IN (Mesh General) ×2 IMPLANT
PACK COLON (CUSTOM PROCEDURE TRAY) ×3 IMPLANT
PAD POSITIONING PINK XL (MISCELLANEOUS) ×3 IMPLANT
PORT LAP GEL ALEXIS MED 5-9CM (MISCELLANEOUS) IMPLANT
PROTECTOR NERVE ULNAR (MISCELLANEOUS) ×4 IMPLANT
RTRCTR WOUND ALEXIS 18CM MED (MISCELLANEOUS)
SCISSORS LAP 5X45 EPIX DISP (ENDOMECHANICALS) IMPLANT
SET IRRIG TUBING LAPAROSCOPIC (IRRIGATION / IRRIGATOR) ×3 IMPLANT
SET TUBE SMOKE EVAC HIGH FLOW (TUBING) ×3 IMPLANT
SLEEVE XCEL OPT CAN 5 100 (ENDOMECHANICALS) ×2 IMPLANT
STAPLER VISISTAT 35W (STAPLE) ×3 IMPLANT
SUT CHROMIC 3 0 SH 27 (SUTURE) IMPLANT
SUT PDS AB 1 CTX 36 (SUTURE) IMPLANT
SUT PDS AB 1 TP1 96 (SUTURE) IMPLANT
SUT PDS AB 4-0 SH 27 (SUTURE) IMPLANT
SUT PROLENE 2 0 KS (SUTURE) IMPLANT
SUT PROLENE 2 0 SH DA (SUTURE) ×6 IMPLANT
SUT SILK 2 0 (SUTURE) ×3
SUT SILK 2 0 SH CR/8 (SUTURE) IMPLANT
SUT SILK 2-0 18XBRD TIE 12 (SUTURE) ×1 IMPLANT
SUT SILK 3 0 (SUTURE) ×3
SUT SILK 3 0 SH CR/8 (SUTURE) ×3 IMPLANT
SUT SILK 3-0 18XBRD TIE 12 (SUTURE) ×1 IMPLANT
SUT VIC AB 2-0 SH 27 (SUTURE) ×3
SUT VIC AB 2-0 SH 27X BRD (SUTURE) IMPLANT
SUT VIC AB 4-0 SH 18 (SUTURE) ×3 IMPLANT
SYS LAPSCP GELPORT 120MM (MISCELLANEOUS)
SYSTEM LAPSCP GELPORT 120MM (MISCELLANEOUS) IMPLANT
TOWEL OR NON WOVEN STRL DISP B (DISPOSABLE) ×3 IMPLANT
TRAY FOLEY MTR SLVR 14FR STAT (SET/KITS/TRAYS/PACK) ×3 IMPLANT
TROCAR BLADELESS OPT 5 100 (ENDOMECHANICALS) ×3 IMPLANT
TROCAR XCEL NON-BLD 11X100MML (ENDOMECHANICALS) IMPLANT
TUBING CONNECTING 10 (TUBING) IMPLANT
TUBING CONNECTING 10' (TUBING)

## 2019-08-07 NOTE — Anesthesia Postprocedure Evaluation (Signed)
Anesthesia Post Note  Patient: Allison Franco  Procedure(s) Performed: DIAGNOSTIC LAPAROSCOPY WITH OPEN LEFT INGUINAL HERNIA WITH MESH (N/A )     Patient location during evaluation: PACU Anesthesia Type: General Level of consciousness: awake and alert Pain management: pain level controlled Vital Signs Assessment: post-procedure vital signs reviewed and stable Respiratory status: spontaneous breathing, nonlabored ventilation and respiratory function stable Cardiovascular status: blood pressure returned to baseline and stable Postop Assessment: no apparent nausea or vomiting Anesthetic complications: no    Last Vitals:  Vitals:   08/07/19 1845 08/07/19 1850  BP: (!) 145/98 138/85  Pulse: 92 82  Resp: (!) 25 (!) 25  Temp:    SpO2: 100% 97%    Last Pain:  Vitals:   08/07/19 1845  TempSrc:   PainSc: Asleep                 Lynda Rainwater

## 2019-08-07 NOTE — Op Note (Signed)
Allison Franco  06-28-1941 9 Sept 2020    PCP:  [name], FNP   Surgeon: Kaylyn Lim, MD, FACS  Asst:  none  Anes:  General  Preop Dx: Incarcerated left inguinal hernia postop diagnosis left inguinal hernia with evidence of recent incarceration but no evidence of strangulation Postop Dx: See above  Procedure: Laparoscopy and open left inguinal hernia repair with mesh Location Surgery: Or 1 [place] Complications: None  EBL:   Minimal cc  Drains: None  Description of Procedure:  The patient was taken to OR 1.  After anesthesia was administered and the patient was prepped  with chloroprep and a timeout was performed.  Access was achieved with a 5 mm Optiview through the left upper quadrant.  Pelvis was examined and the bowel was reduced following relaxation.  There was no evidence of an compromised bowel.  I palpated the left groin and made an oblique incision over the inguinal canal.  I went down through the fatty tissue and incised the external oblique.  I mobilized a very large fatty sac.  I divided the gubernaculum.  The sac possibly included a slider with bladder and I elected to reduce the entire mass in the abdomen, close the internal ring with a 2-0 Prolene.  I then placed a piece of Bard polypropylene mesh and sutured it to the internal oblique into the inguinal ligament.  I then closed the external oblique with a running 2-0 Vicryl.  I injected Exparel.  The patient was then decompressed.  I observe the repair with the scope throughout the case.  The patient tolerated the procedure well and was taken to the PACU in stable condition.     Matt B. Hassell Done, [place], [facility], [place]

## 2019-08-07 NOTE — Interval H&P Note (Signed)
History and Physical Interval Note:  08/07/2019 4:11 PM  Allison Franco  has presented today for surgery, with the diagnosis of INCARCERATED LEFT INGUINAL HERNIA.  The various methods of treatment have been discussed with the patient and family. After consideration of risks, benefits and other options for treatment, the patient has consented to  Procedure(s): DIAGNOSTIC LAPAROSCOPY WITH OPEN LEFT INGUINAL HERNIA POSSIBLE BOWEL RESECTION (N/A) as a surgical intervention.  The patient's history has been reviewed, patient examined, no change in status, stable for surgery.  I have reviewed the patient's chart and labs.  Questions were answered to the patient's satisfaction.     Pedro Earls

## 2019-08-07 NOTE — Transfer of Care (Signed)
Immediate Anesthesia Transfer of Care Note  Patient: Allison Franco  Procedure(s) Performed: DIAGNOSTIC LAPAROSCOPY WITH OPEN LEFT INGUINAL HERNIA WITH MESH (N/A )  Patient Location: PACU  Anesthesia Type:General  Level of Consciousness: drowsy and patient cooperative  Airway & Oxygen Therapy: Patient Spontanous Breathing and Patient connected to face mask oxygen  Post-op Assessment: Report given to RN and Post -op Vital signs reviewed and stable  Post vital signs: Reviewed and stable  Last Vitals:  Vitals Value Taken Time  BP 184/133 08/07/19 1826  Temp    Pulse 119 08/07/19 1828  Resp 24 08/07/19 1828  SpO2 91 % 08/07/19 1828  Vitals shown include unvalidated device data.  Last Pain:  Vitals:   08/07/19 1514  TempSrc: Oral  PainSc: 3       Patients Stated Pain Goal: 2 ([id number])  Complications: No apparent anesthesia complications

## 2019-08-07 NOTE — ED Notes (Signed)
Pt yelling "I am being treated unfairly because no one will give me anything to drink". RN Jenel Lucks attempted to explain to pt why she is NPO and why she has an NG tube, but pt did not want to listen. Pt searching through her bag for something to drink. Pt very upset that she cannot drink anything.

## 2019-08-07 NOTE — ED Notes (Signed)
Pt understands that she is NPO and why she cannot have anything to eat or drink due to going to surgery. Pt calm at this time and has a swab to wet her mouth. Pt has call bell and knows to call staff if she needs any assistance.

## 2019-08-07 NOTE — ED Notes (Signed)
Pt heard screaming on the phone, staff went to see if they could assist pt and found that pt was calling 911 for "help". Staff explained to pt she cannot call 911 from the ER if there is no emergency. Off-[employer] aware of situation.

## 2019-08-07 NOTE — Progress Notes (Signed)
Called ED at 0730 to obtain report. Unable to receive report

## 2019-08-07 NOTE — H&P (Signed)
Allison Franco is an 78 y.o. female.   Chief Complaint: Abdominal pain HPI: This is a 78 year old female who presented to the emergency department with epigastric and lower abdominal pain last evening.  She reports nausea and one episode of emesis.  She also complained of abdominal distention.  She underwent a CAT scan of the abdomen and pelvis this morning showed an incarcerated left inguinal hernia containing small bowel and apparent bowel obstruction.  She actually had a CT scan in February of this year also showing the incarcerated hernia.  She is uncertain of her last bowel movement.  Since being admitted, she has had a nasogastric tube placed.  She was made n.p.o. but became belligerent and called 911 when she was not allowed to eat or drink.  Currently she does point to the left lower quadrant as the source of her pain but she remains excited.  Past Medical History:  Diagnosis Date  . Anxiety   . Constipation   . Depression    "following husband's death"  . Diabetes mellitus without complication ([place])   . Hypertension   . Tinnitus     Past Surgical History:  Procedure Laterality Date  . ABDOMINAL HYSTERECTOMY    . fribroid surgery      Family History  Problem Relation Age of Onset  . Diabetes Mother   . Hypertension Father    Social History:  reports that she has never smoked. She has never used smokeless tobacco. She reports that she does not drink alcohol or use drugs.  Allergies:  Allergies  Allergen Reactions  . Aspirin Other (See Comments)    insomnia  . Hydroxyzine Other (See Comments)    insomnia  . Valium [Diazepam] Other (See Comments)    Insomnia, 05/10/16 pt states she took as young person- made her depressed    (Not in a hospital admission)   Results for orders placed or performed during the hospital encounter of 08/06/19 (from the past 48 hour(s))  Lipase, blood     Status: None   Collection Time: 08/06/19 11:12 PM  Result Value Ref Range   Lipase 27 11 -  51 U/L    Comment: Performed at [facility], [place] [address] [place], [place] [address]  Comprehensive metabolic panel     Status: Abnormal   Collection Time: 08/06/19 11:12 PM  Result Value Ref Range   Sodium 134 (L) 135 - 145 mmol/L   Potassium 3.9 3.5 - 5.1 mmol/L   Chloride 97 (L) 98 - 111 mmol/L   CO2 29 22 - 32 mmol/L   Glucose, Bld 210 (H) 70 - 99 mg/dL   BUN 16 8 - 23 mg/dL   Creatinine, Ser 0.76 0.44 - 1.00 mg/dL   Calcium 8.8 (L) 8.9 - 10.3 mg/dL   Total Protein 7.3 6.5 - 8.1 g/dL   Albumin 3.8 3.5 - 5.0 g/dL   AST 20 15 - 41 U/L   ALT 24 0 - 44 U/L   Alkaline Phosphatase 87 38 - 126 U/L   Total Bilirubin 0.7 0.3 - 1.2 mg/dL   GFR calc non Af Amer >60 >60 mL/min   GFR calc Af Amer >60 >60 mL/min   Anion gap 8 5 - 15    Comment: Performed at [facility], [place] [address] [place], [place] [address]  CBC with Differential/Platelet     Status: None   Collection Time: 08/06/19 11:28 PM  Result Value Ref Range   WBC 7.6 4.0 -  10.5 K/uL   RBC 4.63 3.87 - 5.11 MIL/uL   Hemoglobin 13.2 12.0 - 15.0 g/dL   HCT 41.6 36.0 - 46.0 %   MCV 89.8 80.0 - 100.0 fL   MCH 28.5 26.0 - 34.0 pg   MCHC 31.7 30.0 - 36.0 g/dL   RDW 12.1 11.5 - 15.5 %   Platelets 237 150 - 400 K/uL   nRBC 0.0 0.0 - 0.2 %   Neutrophils Relative % 76 %   Neutro Abs 5.8 1.7 - 7.7 K/uL   Lymphocytes Relative 16 %   Lymphs Abs 1.2 0.7 - 4.0 K/uL   Monocytes Relative 6 %   Monocytes Absolute 0.4 0.1 - 1.0 K/uL   Eosinophils Relative 2 %   Eosinophils Absolute 0.1 0.0 - 0.5 K/uL   Basophils Relative 0 %   Basophils Absolute 0.0 0.0 - 0.1 K/uL   Immature Granulocytes 0 %   Abs Immature Granulocytes 0.02 0.00 - 0.07 K/uL    Comment: Performed at [facility], [place] [address] [place], [place] [address]  Urinalysis, Routine w reflex microscopic     Status: Abnormal   Collection Time: 08/06/19 11:45 PM  Result Value Ref Range   Color, Urine YELLOW YELLOW    APPearance CLEAR CLEAR   Specific Gravity, Urine 1.019 1.005 - 1.030   pH 7.0 5.0 - 8.0   Glucose, UA NEGATIVE NEGATIVE mg/dL   Hgb urine dipstick NEGATIVE NEGATIVE   Bilirubin Urine NEGATIVE NEGATIVE   Ketones, ur NEGATIVE NEGATIVE mg/dL   Protein, ur NEGATIVE NEGATIVE mg/dL   Nitrite NEGATIVE NEGATIVE   Leukocytes,Ua SMALL (A) NEGATIVE   RBC / HPF 0-5 0 - 5 RBC/hpf   WBC, UA 6-10 0 - 5 WBC/hpf   Bacteria, UA NONE SEEN NONE SEEN   Squamous Epithelial / LPF 0-5 0 - 5    Comment: Performed at [facility], [place] [address] [place], [place] [address]  SARS Coronavirus 2 [facility] order, Performed in [facility] hospital lab) Nasopharyngeal Nasopharyngeal Swab     Status: None   Collection Time: 08/07/19  2:28 AM   Specimen: Nasopharyngeal Swab  Result Value Ref Range   SARS Coronavirus 2 NEGATIVE NEGATIVE    Comment: (NOTE) If result is NEGATIVE SARS-CoV-2 target nucleic acids are NOT DETECTED. The SARS-CoV-2 RNA is generally detectable in upper and lower  respiratory specimens during the acute phase of infection. The lowest  concentration of SARS-CoV-2 viral copies this assay can detect is 250  copies / mL. A negative result does not preclude SARS-CoV-2 infection  and should not be used as the sole basis for treatment or other  patient management decisions.  A negative result may occur with  improper specimen collection / handling, submission of specimen other  than nasopharyngeal swab, presence of viral mutation(s) within the  areas targeted by this assay, and inadequate number of viral copies  (<250 copies / mL). A negative result must be combined with clinical  observations, patient history, and epidemiological information. If result is POSITIVE SARS-CoV-2 target nucleic acids are DETECTED. The SARS-CoV-2 RNA is generally detectable in upper and lower  respiratory specimens dur ing the acute phase of infection.  Positive  results are indicative of active  infection with SARS-CoV-2.  Clinical  correlation with patient history and other diagnostic information is  necessary to determine patient infection status.  Positive results do  not rule out bacterial infection or co-infection with other viruses. If result is PRESUMPTIVE POSTIVE SARS-CoV-2  nucleic acids MAY BE PRESENT.   A presumptive positive result was obtained on the submitted specimen  and confirmed on repeat testing.  While 2019 novel coronavirus  (SARS-CoV-2) nucleic acids may be present in the submitted sample  additional confirmatory testing may be necessary for epidemiological  and / or clinical management purposes  to differentiate between  SARS-CoV-2 and other Sarbecovirus currently known to infect humans.  If clinically indicated additional testing with an alternate test  methodology [phone]) is advised. The SARS-CoV-2 RNA is generally  detectable in upper and lower respiratory sp ecimens during the acute  phase of infection. The expected result is Negative. Fact Sheet for Patients:  StrictlyIdeas.no Fact Sheet for Healthcare Providers: BankingDealers.co.za This test is not yet approved or cleared by the [place] FDA and has been authorized for detection and/or diagnosis of SARS-CoV-2 by FDA under an Emergency Use Authorization (EUA).  This EUA will remain in effect (meaning this test can be used) for the duration of the COVID-19 declaration under Section 564(b)(1) of the Act, 21 U.S.C. section 360bbb-3(b)(1), unless the authorization is terminated or revoked sooner. Performed at [facility], [place] [address] [place], [place] [address]    Dg Abdomen 1 View  Result Date: 08/07/2019 CLINICAL DATA:  NG tube placement EXAM: ABDOMEN - 1 VIEW COMPARISON:  None. FINDINGS: Tip the NG tube is seen projecting over the distal stomach. The bowel gas pattern is normal. No radio-opaque calculi or other significant  radiographic abnormality are seen. Contrast within the bladder. IMPRESSION: Tip the NG tube projecting over the distal stomach. Electronically Signed   By: Prudencio Pair M.D.   On: 08/07/2019 03:49   Ct Abdomen Pelvis W Contrast  Result Date: 08/07/2019 CLINICAL DATA:  Diffuse postprandial abdominal pain. EXAM: CT ABDOMEN AND PELVIS WITH CONTRAST TECHNIQUE: Multidetector CT imaging of the abdomen and pelvis was performed using the standard protocol following bolus administration of intravenous contrast. CONTRAST:  165mL OMNIPAQUE IOHEXOL 300 MG/ML  SOLN COMPARISON:  01/04/2019 FINDINGS: Lower chest: Lung bases are clear. No effusions. Heart is normal size. Hepatobiliary: No focal hepatic abnormality. Gallbladder unremarkable. Pancreas: No focal abnormality or ductal dilatation. Spleen: No focal abnormality.  Normal size. Adrenals/Urinary Tract: No adrenal abnormality. No focal renal abnormality. No stones or hydronephrosis. Urinary bladder is unremarkable. Stomach/Bowel: Dilated fluid-filled small bowel loops noted which extend into the pelvis where there is a left inguinal hernia containing a small bowel loop which appears to be the cause of the small bowel obstruction. Distal small bowel is decompressed. Appendix is normal. Stomach is dilated and fluid-filled. Large bowel decompressed. Vascular/Lymphatic: No evidence of aneurysm or adenopathy. Aortic atherosclerosis Reproductive: No abnormality visualized. Other: Small amount of free fluid in the pelvis.  No free air. Musculoskeletal: No acute bony abnormality. IMPRESSION: Left inguinal hernia containing a small bowel loop which causes small bowel obstruction. Small amount of free fluid in the pelvis. Aortic atherosclerosis. Electronically Signed   By: Rolm Baptise M.D.   On: 08/07/2019 01:16    Review of Systems  Unable to perform ROS: Acuity of condition  All other systems reviewed and are negative.   Blood pressure 127/71, pulse 86, temperature 98.6  F (37 C), temperature source Oral, resp. rate 20, height 5\' 3"  (1.6 m), weight 81.6 kg, SpO2 97 %. Physical Exam  Constitutional: She is oriented to person, place, and time. She appears well-developed and well-nourished. She appears distressed.  Anxious appearing woman sitting up, yelling  HENT:  Head: Normocephalic and  atraumatic.  Right Ear: External ear normal.  Left Ear: External ear normal.  Nose: Nose normal.  Mouth/Throat: No oropharyngeal exudate.  Eyes: Pupils are equal, round, and reactive to light. Right eye exhibits no discharge. Left eye exhibits no discharge. No scleral icterus.  Neck: Normal range of motion. Neck supple. No tracheal deviation present.  Cardiovascular: Normal rate, regular rhythm, normal heart sounds and intact distal pulses.  No murmur heard. Respiratory: Effort normal and breath sounds normal. No respiratory distress. She has no wheezes.  GI:  Her abdomen is soft.  There is minimal distention.  There is a lower midline incision.  She is mildly tender in the left inguinal hernia.  Secondary to her pannus and her willingness to completely relax, I cannot easily feel the incarcerated left inguinal hernia. There is no peritonitis  Musculoskeletal: Normal range of motion.        General: No deformity or edema.  Neurological: She is alert and oriented to person, place, and time.  Skin: Skin is warm and dry. No rash noted. She is not diaphoretic.  Psychiatric:  Very excited and anxious     Assessment/Plan Small bowel obstruction  Likely, this is from the left inguinal hernia which is incarcerated.  It does, however, appear similar on CT scan to the February/2020 CT scan.  The current scan, however, does show dilated small bowel.  Previous hysterectomy, SBO from adhesions cannot be completely ruled out.  Nasogastric tube has been placed. White blood count is normal.  I will discuss her care with our main surgeon on today as to the need for potential urgent  surgery regarding the hernia.  She will currently remain n.p.o. with a nasogastric in place.  I tried to explain to her the reasons that she needs to stay n.p.o.  Coralie Keens, MD 08/07/2019, 6:11 AM

## 2019-08-07 NOTE — ED Notes (Signed)
Pt screaming loudly. This write and Aldona Bar , NT went into patients room and she stated that she felt like she is being treated unfairly due to her not being able to eat and drink. This Probation officer and [place], NT kept reminding her that she can not eat and drink and tried to explain why.

## 2019-08-07 NOTE — Anesthesia Procedure Notes (Signed)
Procedure Name: Intubation Date/Time: 08/07/2019 4:31 PM Performed by: Claudia Desanctis, CRNA Pre-anesthesia Checklist: Patient identified, Emergency Drugs available, Suction available and Patient being monitored Patient Re-evaluated:Patient Re-evaluated prior to induction Oxygen Delivery Method: Circle system utilized Preoxygenation: Pre-oxygenation with 100% oxygen Induction Type: IV induction Ventilation: Mask ventilation without difficulty Laryngoscope Size: 2 and Miller Grade View: Grade I Tube type: Oral Tube size: 7.5 mm Number of attempts: 1 Airway Equipment and Method: Stylet Placement Confirmation: ETT inserted through vocal cords under direct vision,  positive ETCO2 and breath sounds checked- equal and bilateral Secured at: 21 cm Tube secured with: Tape Dental Injury: Teeth and Oropharynx as per pre-operative assessment

## 2019-08-07 NOTE — Anesthesia Preprocedure Evaluation (Signed)
Anesthesia Evaluation  Patient identified by MRN, date of birth, ID band Patient awake    Reviewed: Allergy & Precautions, NPO status , Patient's Chart, lab work & pertinent test results  Airway Mallampati: I       Dental no notable dental hx. (+) Teeth Intact   Pulmonary neg pulmonary ROS,    Pulmonary exam normal breath sounds clear to auscultation       Cardiovascular hypertension, Pt. on medications Normal cardiovascular exam Rhythm:Regular Rate:Normal     Neuro/Psych negative neurological ROS     GI/Hepatic negative GI ROS, Neg liver ROS,   Endo/Other  diabetes, Type 2, Oral Hypoglycemic Agents  Renal/GU negative Renal ROS  negative genitourinary   Musculoskeletal negative musculoskeletal ROS (+)   Abdominal (+) + obese,   Peds  Hematology   Anesthesia Other Findings   Reproductive/Obstetrics                             Anesthesia Physical Anesthesia Plan  ASA: II  Anesthesia Plan: General   Post-op Pain Management:    Induction: Intravenous  PONV Risk Score and Plan: 4 or greater and Ondansetron and Treatment may vary due to age or medical condition  Airway Management Planned: Oral ETT  Additional Equipment: None  Intra-op Plan:   Post-operative Plan: Extubation in OR  Informed Consent: I have reviewed the patients History and Physical, chart, labs and discussed the procedure including the risks, benefits and alternatives for the proposed anesthesia with the patient or authorized representative who has indicated his/her understanding and acceptance.     Dental advisory given  Plan Discussed with: CRNA  Anesthesia Plan Comments:         Anesthesia Quick Evaluation

## 2019-08-08 ENCOUNTER — Encounter (HOSPITAL_COMMUNITY): Payer: Self-pay | Admitting: Surgery

## 2019-08-08 LAB — HEMOGLOBIN A1C
Hgb A1c MFr Bld: 6.6 % — ABNORMAL HIGH (ref 4.8–5.6)
Mean Plasma Glucose: 143 mg/dL

## 2019-08-08 LAB — GLUCOSE, CAPILLARY
Glucose-Capillary: 107 mg/dL — ABNORMAL HIGH (ref 70–99)
Glucose-Capillary: 117 mg/dL — ABNORMAL HIGH (ref 70–99)
Glucose-Capillary: 124 mg/dL — ABNORMAL HIGH (ref 70–99)
Glucose-Capillary: 125 mg/dL — ABNORMAL HIGH (ref 70–99)
Glucose-Capillary: 190 mg/dL — ABNORMAL HIGH (ref 70–99)
Glucose-Capillary: 226 mg/dL — ABNORMAL HIGH (ref 70–99)

## 2019-08-08 MED ORDER — INSULIN ASPART 100 UNIT/ML ~~LOC~~ SOLN: 0.0000 [IU] | Freq: Three times a day (TID) | SUBCUTANEOUS | Status: DC

## 2019-08-08 MED ORDER — LIP MEDEX EX OINT
TOPICAL_OINTMENT | CUTANEOUS | Status: AC
  Administered 2019-08-08: 05:00:00
  Filled 2019-08-08: qty 7

## 2019-08-08 MED ORDER — INSULIN ASPART 100 UNIT/ML ~~LOC~~ SOLN
0.0000 [IU] | Freq: Three times a day (TID) | SUBCUTANEOUS | Status: DC
  Administered 2019-08-08: 7 [IU] via SUBCUTANEOUS

## 2019-08-08 NOTE — Evaluation (Signed)
Physical Therapy Evaluation Patient Details Name: Allison Franco MRN: [id number] DOB: 26-Apr-1941 Today's Date: 08/08/2019   History of Present Illness  This is a 78 year old female who presented to the emergency department with epigastric and lower abdominal pain, reporting nausea and one episode of emesis as well as abdominal distention.  CAT scan of the abdomen and pelvis showed an incarcerated left inguinal hernia containing small bowel and apparent bowel obstruction. 9/9 laparoscopic and open left inguinal hernia repair with mesh.  Clinical Impression  Pt tolerated walking with no AD ,  slow and steady around the unit with min guard /supervision due to only 2nd time up. BPs stated WNL and pt did not report any dizziness with this walk. Pain improved as patient walked, however pt still reported some concern of how she will manage all she needs to at home alone. Continued to educate on body mechanics and bed mobility to minimize pressure and strain in abdominal area by rolling first before before sitting up EOB. Will continue to follow if stays here in acure care, recommend home with HHPT to check with pt's progression at home.  Encourage nursing to ambulate with pt around the unit as well.     Follow Up Recommendations Home health PT    Equipment Recommendations  None recommended by PT    Recommendations for Other Services       Precautions / Restrictions Precautions Precautions: Other (comment) Precaution Comments: L abdominal surgery, educated pt of log roll for proper bed mobility body mechanics and bracing with pillow or blacnket when coughing or exerting to minimize pressure and pain      Mobility  Bed Mobility Overal bed mobility: Modified Independent             General bed mobility comments: just continued to try to educate to avoid abdomenal pressure by sidelying first. Pt needed Maz cues for this due to she like to sit striaght up and lie straight  back.  Transfers Overall transfer level: Modified independent Equipment used: None                Ambulation/Gait Ambulation/Gait assistance: Min guard Gait Distance (Feet): 250 Feet Assistive device: None Gait Pattern/deviations: Step-through pattern     General Gait Details: slow , steady., some soreness in Left inginal area, but pt stated it improved with walking. Some SOB / dyspnea 2/4 with ambulation .  Stairs            Wheelchair Mobility    Modified Rankin (Stroke Patients Only)       Balance                                             Pertinent Vitals/Pain Pain Assessment: 0-10 Pain Score: 5  Pain Location: Left inginal area, around abdomen to left side as well Pain Descriptors / Indicators: Pressure Pain Intervention(s): Monitored during session    Home Living Family/patient expects to be discharged to:: Private residence Living Arrangements: Alone Available Help at Discharge: (unclear, pt states she has a lot of friends and family, but then states no one to check or help on her.) Type of Home: Apartment Home Access: Level entry     Home Layout: One level        Prior Function Level of Independence: Independent         Comments: " i like [place] walk  around the city"     Hand Dominance        Extremity/Trunk Assessment        Lower Extremity Assessment Lower Extremity Assessment: Generalized weakness       Communication   Communication: No difficulties  Cognition Arousal/Alertness: Awake/alert Behavior During Therapy: WFL for tasks assessed/performed Overall Cognitive Status: Within Functional Limits for tasks assessed                                        General Comments      Exercises     Assessment/Plan    PT Assessment Patient needs continued PT services  PT Problem List Decreased activity tolerance;Decreased mobility       PT Treatment Interventions Functional mobility  training;Therapeutic activities;Patient/family education;Gait training    PT Goals (Current goals can be found in the Care Plan section)  Acute Rehab PT Goals Patient Stated Goal: I want to be strong and able to help myself again and walk around. PT Goal Formulation: With patient Time For Goal Achievement: 08/22/19 Potential to Achieve Goals: Good    Frequency Min 3X/week   Barriers to discharge Decreased caregiver support      Co-evaluation               AM-PAC PT "6 Clicks" Mobility  Outcome Measure Help needed turning from your back to your side while in a flat bed without using bedrails?: A Little Help needed moving from lying on your back to sitting on the side of a flat bed without using bedrails?: A Little Help needed moving to and from a bed to a chair (including a wheelchair)?: A Little Help needed standing up from a chair using your arms (e.g., wheelchair or bedside chair)?: A Little Help needed to walk in hospital room?: A Little Help needed climbing 3-5 steps with a railing? : A Little 6 Click Score: 18    End of Session Equipment Utilized During Treatment: Gait belt Activity Tolerance: Patient tolerated treatment well Patient left: with bed alarm set;with nursing/sitter in room;in bed Nurse Communication: Mobility status PT Visit Diagnosis: Other abnormalities of gait and mobility (R26.89)    Time: 1130-1200 PT Time Calculation (min) (ACUTE ONLY): 30 min   Charges:   PT Evaluation $PT Eval Low Complexity: 1 Low PT Treatments $Gait Training: 8-22 mins        Clide Dales, PT Acute Rehabilitation Services Pager: [phone] Office: [phone] 08/08/2019   Clide Dales 08/08/2019, 1:37 PM

## 2019-08-08 NOTE — Discharge Instructions (Signed)
[facility] _______[facility] °INGUINAL HERNIA REPAIR: POST OP INSTRUCTIONS ° °Always review your discharge instruction sheet given to you by the facility where your surgery was performed. °IF YOU HAVE DISABILITY OR FAMILY LEAVE FORMS, YOU MUST BRING THEM TO THE OFFICE FOR PROCESSING.   °DO NOT GIVE THEM TO YOUR DOCTOR. ° °1. A  prescription for pain medication may be given to you upon discharge.  Take your pain medication as prescribed, if needed.  If narcotic pain medicine is not needed, then you may take acetaminophen (Tylenol) or ibuprofen (Advil) as needed. °2. Take your usually prescribed medications unless otherwise directed. °If you need a refill on your pain medication, please contact your pharmacy.  They will contact our office to request authorization. Prescriptions will not be filled after 5 pm or on week-ends. °3. You should follow a light diet the first 24 hours after arrival home, such as soup and crackers, etc.  Be sure to include lots of fluids daily.  Resume your normal diet the day after surgery. °4.Most patients will experience some swelling and bruising around the umbilicus or in the groin and scrotum.  Ice packs and reclining will help.  Swelling and bruising can take several days to resolve.  °6. It is common to experience some constipation if taking pain medication after surgery.  Increasing fluid intake and taking a stool softener (such as Colace) will usually help or prevent this problem from occurring.  A mild laxative (Milk of Magnesia or Miralax) should be taken according to package directions if there are no bowel movements after 48 hours. °7. Unless discharge instructions indicate otherwise, you may remove your bandages 24-48 hours after surgery, and you may shower at that time.  You may have steri-strips (small skin tapes) in place directly over the incision.  These strips should be left on the skin for 7-10 days.  If your surgeon used skin glue on the incision, you may  shower in 24 hours.  The glue will flake off over the next 2-3 weeks.  Any sutures or staples will be removed at the office during your follow-up visit. °8. ACTIVITIES:  You may resume regular (light) daily activities beginning the next day--such as daily self-care, walking, climbing stairs--gradually increasing activities as tolerated.  You may have sexual intercourse when it is comfortable.  Refrain from any heavy lifting or straining until approved by your doctor. ° °a.You may drive when you are no longer taking prescription pain medication, you can comfortably wear a seatbelt, and you can safely maneuver your car and apply brakes. °b.RETURN TO WORK:   °_____________________________________________ ° °9.You should see your doctor in the office for a follow-up appointment approximately 2-3 weeks after your surgery.  Make sure that you call for this appointment within a day or two after you arrive home to insure a convenient appointment time. °10.OTHER INSTRUCTIONS: _________________________ °   _____________________________________ ° °WHEN TO CALL YOUR DOCTOR: °1. Fever over 101.0 °2. Inability to urinate °3. Nausea and/or vomiting °4. Extreme swelling or bruising °5. Continued bleeding from incision. °6. Increased pain, redness, or drainage from the incision ° °The clinic staff is available to answer your questions during regular business hours.  Please don’t hesitate to call and ask to speak to one of the nurses for clinical concerns.  If you have a medical emergency, go to the nearest emergency room or call 911.  A surgeon from [facility] is always on call at the hospital ° ° °[address], [address], [place], [place]  [address] ? ° [address] [address], [place], [place]   [address] °[phone] ? [phone] ? FAX [phone] °Web site: [email or web address] ° °

## 2019-08-08 NOTE — Progress Notes (Signed)
Dr. Hassell Done aware Pt lost IV site. Awaiting IV team. MD said to leave out.

## 2019-08-08 NOTE — Progress Notes (Signed)
[facility]/Trauma Progress Note  1 Day Post-Op   Assessment/Plan  HTN -holding home Benicar DM -SSI  SBO 2/2 left inguinal hernia - S/P laparoscopic and open left inguinal hernia repair with mesh, Dr. Hassell Done, 08/07/2019 - Advance diet as tolerated, PT, ambulate, IS  FEN: Carb modified VTE: SCD's, lovenox ID: Preop only Foley: None Follow up: Dr. Hassell Done  DISPO: PT pending, possible discharge this afternoon versus tomorrow    LOS: 1 day    Subjective: CC: Left groin pain  Patient states pain medicine is helping her pain.  She has a grape juice this morning and water otherwise nothing else by mouth.  She is not nauseated nor had any vomiting overnight.  Patient lives by herself.  No issues overnight.  Objective: Vital signs in last 24 hours: Temp:  [97.5 F (36.4 C)-98.7 F (37.1 C)] 98.7 F (37.1 C) (09/10 0600) Pulse Rate:  [69-123] 78 (09/10 0600) Resp:  [17-26] 17 (09/10 0600) BP: (120-228)/(63-133) 120/63 (09/10 0600) SpO2:  [88 %-100 %] 100 % (09/10 0600) Weight:  [81.6 kg] 81.6 kg (09/09 1514) Last BM Date: 08/06/19  Intake/Output from previous day: 09/09 0701 - 09/10 0700 In: 3239.1 [P.O.:520; I.V.:2719.1] Out: 2310 [Urine:2150; Emesis/NG output:150; Blood:10] Intake/Output this shift: No intake/output data recorded.  PE: Gen:  Alert, NAD, pleasant, cooperative Card:  RRR, no M/G/R heard Pulm:  CTA, no W/R/R, effort normal Abd: Soft, ND, +BS, left groin incision with glue intact is well-appearing without bleeding or drainage.  Mild LLQ TTP without guarding.  No peritonitis Skin: no rashes noted, warm and dry   Anti-infectives: Anti-infectives (From admission, onward)   Start     Dose/Rate Route Frequency Ordered Stop   08/07/19 0945  cefoTEtan (CEFOTAN) 2 g in sodium chloride 0.9 % 100 mL IVPB     2 g 200 mL/hr over 30 Minutes Intravenous On call to O.R. 08/07/19 0942 08/07/19 1644      Lab Results:  Recent Labs    08/06/19 2328   WBC 7.6  HGB 13.2  HCT 41.6  PLT 237   BMET Recent Labs    08/06/19 2312  NA 134*  K 3.9  CL 97*  CO2 29  GLUCOSE 210*  BUN 16  CREATININE 0.76  CALCIUM 8.8*   PT/INR No results for input(s): LABPROT, INR in the last 72 hours. CMP     Component Value Date/Time   NA 134 (L) 08/06/2019 2312   NA 136 10/04/2018 1556   K 3.9 08/06/2019 2312   CL 97 (L) 08/06/2019 2312   CO2 29 08/06/2019 2312   GLUCOSE 210 (H) 08/06/2019 2312   BUN 16 08/06/2019 2312   BUN 16 10/04/2018 1556   CREATININE 0.76 08/06/2019 2312   CALCIUM 8.8 (L) 08/06/2019 2312   PROT 7.3 08/06/2019 2312   PROT 6.6 10/04/2018 1556   ALBUMIN 3.8 08/06/2019 2312   ALBUMIN 3.9 10/04/2018 1556   AST 20 08/06/2019 2312   ALT 24 08/06/2019 2312   ALKPHOS 87 08/06/2019 2312   BILITOT 0.7 08/06/2019 2312   BILITOT 0.2 10/04/2018 1556   GFRNONAA >60 08/06/2019 2312   GFRAA >60 08/06/2019 2312   Lipase     Component Value Date/Time   LIPASE 27 08/06/2019 2312    Studies/Results: Dg Abdomen 1 View  Result Date: 08/07/2019 CLINICAL DATA:  NG tube placement EXAM: ABDOMEN - 1 VIEW COMPARISON:  None. FINDINGS: Tip the NG tube is seen projecting over the distal stomach. The bowel gas pattern  is normal. No radio-opaque calculi or other significant radiographic abnormality are seen. Contrast within the bladder. IMPRESSION: Tip the NG tube projecting over the distal stomach. Electronically Signed   By: Prudencio Pair M.D.   On: 08/07/2019 03:49   Ct Abdomen Pelvis W Contrast  Result Date: 08/07/2019 CLINICAL DATA:  Diffuse postprandial abdominal pain. EXAM: CT ABDOMEN AND PELVIS WITH CONTRAST TECHNIQUE: Multidetector CT imaging of the abdomen and pelvis was performed using the standard protocol following bolus administration of intravenous contrast. CONTRAST:  167mL OMNIPAQUE IOHEXOL 300 MG/ML  SOLN COMPARISON:  01/04/2019 FINDINGS: Lower chest: Lung bases are clear. No effusions. Heart is normal size. Hepatobiliary: No  focal hepatic abnormality. Gallbladder unremarkable. Pancreas: No focal abnormality or ductal dilatation. Spleen: No focal abnormality.  Normal size. Adrenals/Urinary Tract: No adrenal abnormality. No focal renal abnormality. No stones or hydronephrosis. Urinary bladder is unremarkable. Stomach/Bowel: Dilated fluid-filled small bowel loops noted which extend into the pelvis where there is a left inguinal hernia containing a small bowel loop which appears to be the cause of the small bowel obstruction. Distal small bowel is decompressed. Appendix is normal. Stomach is dilated and fluid-filled. Large bowel decompressed. Vascular/Lymphatic: No evidence of aneurysm or adenopathy. Aortic atherosclerosis Reproductive: No abnormality visualized. Other: Small amount of free fluid in the pelvis.  No free air. Musculoskeletal: No acute bony abnormality. IMPRESSION: Left inguinal hernia containing a small bowel loop which causes small bowel obstruction. Small amount of free fluid in the pelvis. Aortic atherosclerosis. Electronically Signed   By: Rolm Baptise M.D.   On: 08/07/2019 01:16      Kalman Drape , [facility] 08/08/2019, 9:23 AM  Pager: [phone] Mon-Wed, Friday 7:00am-4:30pm Thurs 7am-11:30am  Consults: [phone]

## 2019-08-09 LAB — GLUCOSE, CAPILLARY: Glucose-Capillary: 116 mg/dL — ABNORMAL HIGH (ref 70–99)

## 2019-08-09 MED ORDER — ACETAMINOPHEN 500 MG PO TABS: 500.0000 mg | ORAL_TABLET | Freq: Four times a day (QID) | ORAL | 0 refills | Status: DC | PRN

## 2019-08-09 MED ORDER — HYDROCODONE-ACETAMINOPHEN 5-325 MG PO TABS: 1.0000 | ORAL_TABLET | Freq: Four times a day (QID) | ORAL | 0 refills | Status: DC | PRN

## 2019-08-09 NOTE — Discharge Summary (Signed)
[facility]/Trauma Discharge Summary   Patient ID: Allison Franco MRN: [id number] DOB/AGE: January 02, 1941 78 y.o.  Admit date: 08/06/2019 Discharge date: 08/09/2019  Admitting Diagnosis: Incarcerated left inguinal hernia  Discharge Diagnosis Patient Active Problem List   Diagnosis Date Noted  . Inguinal hernia with bowel obstruction 08/07/2019  . Incarcerated left inguinal hernia 08/07/2019  . Anemia 08/31/2018  . Controlled type 2 diabetes mellitus with hyperglycemia ([place]) 08/31/2018  . Essential hypertension 07/03/2018  . Type 2 diabetes mellitus without complication, without long-term current use of insulin ([place]) 07/02/2018  . Grief reaction 10/20/2013  . Obesity 10/20/2013  . ALLERGIC RHINITIS 08/11/2009  . KNEE PAIN, BILATERAL 02/13/2009  . INSOMNIA 07/17/2008  . TIREDNESS 04/16/2008  . HYPERTENSION 01/24/2008    Consultants None  Imaging: No results found.  Procedures Dr. Hassell Done (08/07/19) - Laparoscopy and open left inguinal hernia repair with mesh  HPI: This is a 78 year old female who presented to the emergency department with epigastric and lower abdominal pain last evening.  She reports nausea and one episode of emesis.  She also complained of abdominal distention.  She underwent a CAT scan of the abdomen and pelvis this morning showed an incarcerated left inguinal hernia containing small bowel and apparent bowel obstruction.  She actually had a CT scan in February of this year also showing the incarcerated hernia.  She is uncertain of her last bowel movement.  Since being admitted, she has had a nasogastric tube placed.  She was made n.p.o. but became belligerent and called 911 when she was not allowed to eat or drink.  Currently she does point to the left lower quadrant as the source of her pain but she remains excited.  Hospital Course:  Patient was admitted and underwent procedure listed above.  Tolerated procedure well and was transferred to the floor.   Diet was advanced as tolerated.  On POD#2, the patient was voiding well, tolerating diet, ambulating well, pain well controlled, vital signs stable, incisions well-appearing and felt stable for discharge home.  Patient will follow up as outlined below and knows to call with questions or concerns.     Patient was discharged in good condition.  The [place] Substance controlled database was reviewed prior to prescribing narcotic pain medication to this patient.  Physical Exam: Gen:  Alert, NAD, pleasant, cooperative Card:  RRR, no M/G/R heard Pulm:  CTA, no W/R/R, effort normal Abd: Soft, ND, +BS, left groin incision with glue intact is well-appearing without bleeding or drainage. Mild non blanching surrounding erythema and edema. Not cellulitic in appearance. Mild LLQ TTP without guarding.  No peritonitis Skin: no rashes noted, warm and dry   Allergies as of 08/09/2019      Reactions   Aspirin Other (See Comments)   insomnia   Hydroxyzine Other (See Comments)   insomnia   Valium [diazepam] Other (See Comments)   Insomnia, 05/10/16 pt states she took as young person- made her depressed      Medication List    TAKE these medications   acetaminophen 500 MG tablet Commonly known as: TYLENOL Take 1 tablet (500 mg total) by mouth every 6 (six) hours as needed for mild pain, moderate pain or headache. Reported on 05/10/2016 What changed: how much to take   Alcohol Wipes 70 % Pads by Does not apply route. Use with blood sugar check and injection of insulin   blood glucose meter kit and supplies Kit Dispense based on patient and insurance preference. Use up to four times  daily as directed. (FOR ICD-9 250.00, 250.01).   Calcium 600-200 MG-UNIT tablet Take 1 tablet by mouth daily.   EPINEPHrine 0.3 mg/0.3 mL Soaj injection Commonly known as: EpiPen 2-Pak Inject 0.3 mLs (0.3 mg total) into the muscle once as needed (for severe allergic reaction). CAll 911 immediately if you have to  use this medicine   HYDROcodone-acetaminophen 5-325 MG tablet Commonly known as: NORCO/VICODIN Take 1 tablet by mouth every 6 (six) hours as needed for moderate pain.   metFORMIN 500 MG tablet Commonly known as: GLUCOPHAGE TAKE 1 TABLET BY MOUTH 2 TIMES EVERY DAY WITH MORNING AND EVENING MEALS What changed: See the new instructions.   multivitamin with minerals Tabs tablet Take 1 tablet by mouth daily. Centrum   olmesartan 20 MG tablet Commonly known as: BENICAR TAKE 1 TABLET BY MOUTH ONCE DAILY   Vitamin D 50 MCG (2000 UT) tablet Take 2,000 Units by mouth daily.        Follow-up Information    [facility],Dr. Johnathan Hausen. Go on 08/28/2019.   Why: at 11:15am. Please bring photo ID and insurance card Contact information: [address] [place], [place]          Signed: [facility] 08/09/2019, 10:53 AM Pager: [phone] Consults: [phone] Mon-Fri 7:00 am-4:30 pm Sat-Sun 7:00 am-11:30 am

## 2019-08-09 NOTE — TOC Initial Note (Signed)
Transition of Care [facility]) - Initial/Assessment Note    Patient Details  Name: Allison Franco MRN: [id number] Date of Birth: 08-May-1941  [facility]) CM/SW Contact:    Lia Hopping, [place] Phone Number: 08/09/2019, 9:47 AM  Clinical Narrative:                 CSW discussed HHPT services with the patient at bedside. Patient reports she had home health services in the past for her spouse. She prefers not use that agency again. Patient provided patient with list of agencies from [email or web address]. Patient agreeable to [facility] services.  CSW confirm staff availability, agency aware the patient will discharge today.   Expected Discharge Plan: [place] Barriers to Discharge: No Barriers Identified   Patient Goals and CMS Choice   CMS Medicare.gov Compare Post Acute Care list provided to:: Patient Choice offered to / list presented to : Patient  Expected Discharge Plan and Services Expected Discharge Plan: [place] In-house Referral: Clinical Social Work     Living arrangements for the past 2 months: Apartment Expected Discharge Date: (unknown)                         HH Arranged: PT [place] Agency: [place] Date [place]: 08/09/19 Time [place]: [phone] Representative spoke with at [place]: Georgina Snell  Prior Living Arrangements/Services Living arrangements for the past 2 months: Apartment Lives with:: Self Patient language and need for interpreter reviewed:: Yes Do you feel safe going back to the place where you live?: Yes      Need for Family Participation in Patient Care: Yes (Comment) Care giver support system in place?: Yes (comment) Current home services: Home PT Criminal Activity/Legal Involvement Pertinent to Current Situation/Hospitalization: No - Comment as needed  Activities of Daily Living Home Assistive Devices/Equipment: Eyeglasses, CBG Meter ADL Screening (condition at time of  admission) Patient's cognitive ability adequate to safely complete daily activities?: Yes Is the patient deaf or have difficulty hearing?: No Does the patient have difficulty seeing, even when wearing glasses/contacts?: No Does the patient have difficulty concentrating, remembering, or making decisions?: No Patient able to express need for assistance with ADLs?: Yes Does the patient have difficulty dressing or bathing?: No Independently performs ADLs?: Yes (appropriate for developmental age) Does the patient have difficulty walking or climbing stairs?: No Weakness of Legs: None Weakness of Arms/Hands: None  Permission Sought/Granted Permission sought to share information with : Case Manager Permission granted to share information with : Yes, Verbal Permission Granted     Permission granted to share info w AGENCY: Home Health Agency        Emotional Assessment Appearance:: Appears stated age   Affect (typically observed): Accepting, Calm Orientation: : Oriented to Self, Oriented to Place, Oriented to  Time, Oriented to Situation Alcohol / Substance Use: Not Applicable Psych Involvement: No (comment)  Admission diagnosis:  Inguinal hernia of left side with obstruction [K40.30] Patient Active Problem List   Diagnosis Date Noted  . Inguinal hernia with bowel obstruction 08/07/2019  . Incarcerated left inguinal hernia 08/07/2019  . Anemia 08/31/2018  . Controlled type 2 diabetes mellitus with hyperglycemia ([place]) 08/31/2018  . Essential hypertension 07/03/2018  . Type 2 diabetes mellitus without complication, without long-term current use of insulin ([place]) 07/02/2018  . Grief reaction 10/20/2013  . Obesity 10/20/2013  . ALLERGIC RHINITIS 08/11/2009  . KNEE PAIN, BILATERAL 02/13/2009  .  INSOMNIA 07/17/2008  . TIREDNESS 04/16/2008  . HYPERTENSION 01/24/2008   PCP:  [name], [place] Pharmacy:   [facility], [place] - [place]  [place] [address] Phone: [phone] Fax: [phone]  [place], [place] - [place] [address] Phone: [phone] Fax: [phone]     Social Determinants of Health (SDOH) Interventions    Readmission Risk Interventions No flowsheet data found.

## 2019-08-09 NOTE — Care Management Important Message (Signed)
Important Message  Patient Details IM Letter given to Kathrin Greathouse SW to present to the Patient Name: Allison Franco MRN: [id number] Date of Birth: [birth year]   Medicare Important Message Given:  Yes     Kerin Salen 08/09/2019, 10:42 AM

## 2019-08-10 DIAGNOSIS — I10 Essential (primary) hypertension: Secondary | ICD-10-CM | POA: Diagnosis not present

## 2019-08-10 DIAGNOSIS — F329 Major depressive disorder, single episode, unspecified: Secondary | ICD-10-CM | POA: Diagnosis not present

## 2019-08-10 DIAGNOSIS — H9319 Tinnitus, unspecified ear: Secondary | ICD-10-CM

## 2019-08-10 DIAGNOSIS — E119 Type 2 diabetes mellitus without complications: Secondary | ICD-10-CM | POA: Diagnosis not present

## 2019-08-10 DIAGNOSIS — F419 Anxiety disorder, unspecified: Secondary | ICD-10-CM | POA: Diagnosis not present

## 2019-08-10 DIAGNOSIS — Z90711 Acquired absence of uterus with remaining cervical stump: Secondary | ICD-10-CM | POA: Diagnosis not present

## 2019-08-10 DIAGNOSIS — Z48815 Encounter for surgical aftercare following surgery on the digestive system: Secondary | ICD-10-CM | POA: Diagnosis not present

## 2019-08-10 DIAGNOSIS — Z7984 Long term (current) use of oral hypoglycemic drugs: Secondary | ICD-10-CM | POA: Diagnosis not present

## 2019-08-10 DIAGNOSIS — E65 Localized adiposity: Secondary | ICD-10-CM

## 2019-08-10 DIAGNOSIS — K5900 Constipation, unspecified: Secondary | ICD-10-CM | POA: Diagnosis not present

## 2019-08-10 DIAGNOSIS — I700 Atherosclerosis of aorta: Secondary | ICD-10-CM | POA: Diagnosis not present

## 2019-08-13 DIAGNOSIS — F329 Major depressive disorder, single episode, unspecified: Secondary | ICD-10-CM | POA: Diagnosis not present

## 2019-08-13 DIAGNOSIS — F419 Anxiety disorder, unspecified: Secondary | ICD-10-CM | POA: Diagnosis not present

## 2019-08-13 DIAGNOSIS — I10 Essential (primary) hypertension: Secondary | ICD-10-CM | POA: Diagnosis not present

## 2019-08-13 DIAGNOSIS — E119 Type 2 diabetes mellitus without complications: Secondary | ICD-10-CM | POA: Diagnosis not present

## 2019-08-13 DIAGNOSIS — I700 Atherosclerosis of aorta: Secondary | ICD-10-CM | POA: Diagnosis not present

## 2019-08-13 DIAGNOSIS — Z48815 Encounter for surgical aftercare following surgery on the digestive system: Secondary | ICD-10-CM | POA: Diagnosis not present

## 2019-08-14 ENCOUNTER — Telehealth: Payer: Self-pay

## 2019-08-14 NOTE — Telephone Encounter (Signed)
Transition Care Management Follow-up Telephone Call  Date of discharge and from where: [facility] 08/09/19  How have you been since you were released from the hospital? She has been feeling good  Any questions or concerns? NO  Items Reviewed:  Did the pt receive and understand the discharge instructions provided? YES  Medications obtained and verified? YES  Any new allergies since your discharge? NO   Dietary orders reviewed? NO  Do you have support at home? YES HOME HEALTH  Other (ie: DME, Home Health, etc)   Functional Questionnaire: (I = Independent and D = Dependent) ADL's: I  Bathing/Dressing- I   Meal Prep- I  Eating- I  Maintaining continence- I  Transferring/Ambulation- I  Managing Meds- I   Follow up appointments reviewed:    PCP Hospital f/u appt confirmed? YES Scheduled to see [name] FNP-BC on 08/20/19 @ 4:00PM.  [facility] f/u appt confirmed? YES Scheduled to see DR.BLACK on 09/02/19  Are transportation arrangements needed? NO  If their condition worsens, is the pt aware to call  their PCP or go to the ED? YES  Was the patient provided with contact information for the PCP's office or ED? YES  Was the pt encouraged to call back with questions or concerns? YES

## 2019-08-15 ENCOUNTER — Ambulatory Visit: Payer: Medicare Other | Admitting: Nurse Practitioner

## 2019-08-15 DIAGNOSIS — Z48815 Encounter for surgical aftercare following surgery on the digestive system: Secondary | ICD-10-CM | POA: Diagnosis not present

## 2019-08-15 DIAGNOSIS — F329 Major depressive disorder, single episode, unspecified: Secondary | ICD-10-CM | POA: Diagnosis not present

## 2019-08-15 DIAGNOSIS — F419 Anxiety disorder, unspecified: Secondary | ICD-10-CM | POA: Diagnosis not present

## 2019-08-15 DIAGNOSIS — E119 Type 2 diabetes mellitus without complications: Secondary | ICD-10-CM | POA: Diagnosis not present

## 2019-08-15 DIAGNOSIS — I700 Atherosclerosis of aorta: Secondary | ICD-10-CM | POA: Diagnosis not present

## 2019-08-15 DIAGNOSIS — I10 Essential (primary) hypertension: Secondary | ICD-10-CM | POA: Diagnosis not present

## 2019-08-20 ENCOUNTER — Telehealth (INDEPENDENT_AMBULATORY_CARE_PROVIDER_SITE_OTHER): Payer: Medicare Other | Admitting: Nurse Practitioner

## 2019-08-20 ENCOUNTER — Other Ambulatory Visit: Payer: Self-pay

## 2019-08-20 ENCOUNTER — Encounter: Payer: Self-pay | Admitting: Nurse Practitioner

## 2019-08-20 VITALS — BP 128/62 | Wt 180.0 lb

## 2019-08-20 DIAGNOSIS — F329 Major depressive disorder, single episode, unspecified: Secondary | ICD-10-CM | POA: Diagnosis not present

## 2019-08-20 DIAGNOSIS — E119 Type 2 diabetes mellitus without complications: Secondary | ICD-10-CM | POA: Diagnosis not present

## 2019-08-20 DIAGNOSIS — I10 Essential (primary) hypertension: Secondary | ICD-10-CM | POA: Diagnosis not present

## 2019-08-20 DIAGNOSIS — K4030 Unilateral inguinal hernia, with obstruction, without gangrene, not specified as recurrent: Secondary | ICD-10-CM

## 2019-08-20 DIAGNOSIS — Z09 Encounter for follow-up examination after completed treatment for conditions other than malignant neoplasm: Secondary | ICD-10-CM | POA: Diagnosis not present

## 2019-08-20 DIAGNOSIS — F419 Anxiety disorder, unspecified: Secondary | ICD-10-CM | POA: Diagnosis not present

## 2019-08-20 DIAGNOSIS — I700 Atherosclerosis of aorta: Secondary | ICD-10-CM | POA: Diagnosis not present

## 2019-08-20 DIAGNOSIS — Z48815 Encounter for surgical aftercare following surgery on the digestive system: Secondary | ICD-10-CM | POA: Diagnosis not present

## 2019-08-20 NOTE — Patient Instructions (Signed)
Virtual Visit via telephone   This visit type was conducted due to national recommendations for restrictions regarding the COVID-19 Pandemic (e.g. social distancing) in an effort to limit this patient's exposure and mitigate transmission in our community.  Due to her co-morbid illnesses, this patient is at least at moderate risk for complications without adequate follow up.  This format is felt to be most appropriate for this patient at this time.  All issues noted in this document were discussed and addressed.  A limited physical exam was performed with this format.    This visit type was conducted due to national recommendations for restrictions regarding the COVID-19 Pandemic (e.g. social distancing) in an effort to limit this patient's exposure and mitigate transmission in our community.  Patients identity confirmed using two different identifiers.  This format is felt to be most appropriate for this patient at this time.  All issues noted in this document were discussed and addressed.  No physical exam was performed (except for noted visual exam findings with Video Visits).    Date:  08/27/2019   ID:  Allison Franco, DOB Aug 30, 1941, MRN [id number]  Patient Location:  Home - spoke with Allison Franco  Provider location:   Office    Chief Complaint:  Hospital follow up for incarcerated hernia  History of Present Illness:    Allison Franco is a 78 y.o. female who presents via telephone conferencing for a telehealth visit today.    The patient does not have symptoms concerning for COVID-19 infection (fever, chills, cough, or new shortness of breath).   HPI: She was admitted to hospital on 9/8-9/11 after having abdominal pain, restless. Called 911 and was taken to the hospital had an NGT placed.  She had an xray done she had a inguinal hernia.  She did have vomiting in the hospital, clear like water.  She feels better now.  She had 2 areas of repair.   She is feeling better. She has physical  therapy.    Headache: Patient complains of headache. She does have a headache at this time.   Description of Headaches: Location of pain: left-sided unilateral Radiation of pain?:none Character of pain:dull Severity of pain: 3 Accompanying symptoms: decreased appetite Prodromal sx?: no Rapidity of onset: gradual Typical duration of individual headache: 2 hours Are most headaches similar in presentation? yes Typical precipitants: none  Temporal Pattern of Headaches: Started having HAs a few days ago Worst time of day: varies Awaken from sleep?: no Seasonal pattern?: no 'Clustering' of HAs over time? no Overall pattern since problem began: gradually improving  Degree of Functional Impairment: no  Current Use of Meds to Treat HA: Abortive meds? acetaminophen Daily use? no Prophylactic meds? no  Additional Relevant History: History of head/neck trauma? no History of head/neck surgery? no Family h/o headache problems? no Use of meds that might worsen HAs? no Exposure to carbon monoxide? no Substance use: no      Past Medical History:  Diagnosis Date  . Anxiety   . Constipation   . Depression    "following husband's death"  . Diabetes mellitus without complication ([place])   . Hypertension   . KNEE PAIN, BILATERAL 02/13/2009   Qualifier: Diagnosis of  By: Hassell Done FNP, Tori Milks    . Tinnitus    Past Surgical History:  Procedure Laterality Date  . ABDOMINAL HYSTERECTOMY    . COLON RESECTION N/A 08/07/2019   Procedure: DIAGNOSTIC LAPAROSCOPY WITH OPEN LEFT INGUINAL HERNIA WITH MESH;  Surgeon: Johnathan Hausen, MD;  Location: [facility] ORS;  Service: General;  Laterality: N/A;  . fribroid surgery       Current Meds  Medication Sig  . acetaminophen (TYLENOL) 500 MG tablet Take 1 tablet (500 mg total) by mouth every 6 (six) hours as needed for mild pain, moderate pain or headache. Reported on 05/10/2016  . Alcohol Swabs (ALCOHOL WIPES) 70 % PADS by Does not apply route. Use with  blood sugar check and injection of insulin  . blood glucose meter kit and supplies KIT Dispense based on patient and insurance preference. Use up to four times daily as directed. (FOR ICD-9 250.00, 250.01).  . Calcium 600-200 MG-UNIT tablet Take 1 tablet by mouth as needed.   . Cholecalciferol (VITAMIN D) 50 MCG (2000 UT) tablet Take 2,000 Units by mouth daily.   [name] EPINEPHrine (EPIPEN 2-PAK) 0.3 mg/0.3 mL IJ SOAJ injection Inject 0.3 mLs (0.3 mg total) into the muscle once as needed (for severe allergic reaction). CAll 911 immediately if you have to use this medicine  . metFORMIN (GLUCOPHAGE) 500 MG tablet TAKE 1 TABLET BY MOUTH 2 TIMES EVERY DAY WITH MORNING AND EVENING MEALS (Patient taking differently: Take 500 mg by mouth 2 (two) times daily with a meal. Give w/food.)  . Multiple Vitamin (MULTIVITAMIN WITH MINERALS) TABS tablet Take 1 tablet by mouth daily. Centrum  . olmesartan (BENICAR) 20 MG tablet TAKE 1 TABLET BY MOUTH ONCE DAILY (Patient taking differently: Take 20 mg by mouth daily. )     Allergies:   Aspirin, Hydroxyzine, and Valium [diazepam]   Social History   Tobacco Use  . Smoking status: Never Smoker  . Smokeless tobacco: Never Used  Substance Use Topics  . Alcohol use: No  . Drug use: No     Family Hx: The patient's family history includes Diabetes in her mother; Hypertension in her father.  ROS:   Please see the history of present illness.    Review of Systems - History obtained from the patient General ROS: negative for - chills, fatigue, fever or weight loss Psychological ROS: negative ENT ROS: negative for - sore throat Respiratory ROS: negative for - cough or shortness of breath Cardiovascular ROS: no chest pain or dyspnea on exertion negative for - edema or palpitations Gastrointestinal ROS: positive for - abdominal pain and surgical wounds Neurological ROS: negative for - behavioral changes, dizziness, headaches or visual changes  All other systems  reviewed and are negative.   Labs/Other Tests and Data Reviewed:    Recent Labs: 08/06/2019: ALT 24; BUN 16; Creatinine, Ser 0.76; Hemoglobin 13.2; Platelets 237; Potassium 3.9; Sodium 134   Recent Lipid Panel Lab Results  Component Value Date/Time   CHOL 181 07/04/2018 03:27 AM   TRIG 127 07/04/2018 03:27 AM   HDL 40 (L) 07/04/2018 03:27 AM   CHOLHDL 4.5 07/04/2018 03:27 AM   LDLCALC 116 (H) 07/04/2018 03:27 AM    Wt Readings from Last 3 Encounters:  08/20/19 180 lb (81.6 kg)  08/07/19 180 lb (81.6 kg)  05/09/19 188 lb 12.8 oz (85.6 kg)     Exam:    Vital Signs:  BP 128/62 (BP Location: Left Arm, Patient Position: Sitting, Cuff Size: Small)   Wt 180 lb (81.6 kg)   BMI 31.89 kg/m      ASSESSMENT & PLAN:     1. Incarcerated left inguinal hernia  She had surgical intervention of her left inguinal hernia  She feels she is doing well TCM Performed. A member of the clinical team  spoke with the patient upon dischare. Discharge summary was reviewed in full detail during the visit. Meds reconciled and compared to discharge meds. Medication list is updated and reviewed with the patient.  Greater than 50% face to face time was spent in counseling an coordination of care.  All questions were answered to the satisfaction of the patient.    2. Inguinal hernia with bowel obstruction  Admitted for surgical intervention of hernia, she reports she is now having regular bowel movements  She is to follow up with her surgeon in the next 2 weeks    COVID-19 Education: The signs and symptoms of COVID-19 were discussed with the patient and how to seek care for testing (follow up with PCP or arrange E-visit).  The importance of social distancing was discussed today.  Patient Risk:   After full review of this patients clinical status, I feel that they are at least moderate risk at this time.  Time:   Today, I have spent 20 minutes/ seconds with the patient with telehealth technology  discussing above diagnoses.     Medication Adjustments/Labs and Tests Ordered: Current medicines are reviewed at length with the patient today.  Concerns regarding medicines are outlined above.   Tests Ordered: No orders of the defined types were placed in this encounter.   Medication Changes: No orders of the defined types were placed in this encounter.   Disposition:  Follow up prn  Signed, [name], FNP

## 2019-08-22 DIAGNOSIS — F419 Anxiety disorder, unspecified: Secondary | ICD-10-CM | POA: Diagnosis not present

## 2019-08-22 DIAGNOSIS — I10 Essential (primary) hypertension: Secondary | ICD-10-CM | POA: Diagnosis not present

## 2019-08-22 DIAGNOSIS — F329 Major depressive disorder, single episode, unspecified: Secondary | ICD-10-CM | POA: Diagnosis not present

## 2019-08-22 DIAGNOSIS — I700 Atherosclerosis of aorta: Secondary | ICD-10-CM | POA: Diagnosis not present

## 2019-08-22 DIAGNOSIS — E119 Type 2 diabetes mellitus without complications: Secondary | ICD-10-CM | POA: Diagnosis not present

## 2019-08-22 DIAGNOSIS — Z48815 Encounter for surgical aftercare following surgery on the digestive system: Secondary | ICD-10-CM | POA: Diagnosis not present

## 2019-08-23 MED FILL — OLMESARTAN MEDOXOMIL 20 MG: 20 | 30 days supply | Qty: 30 | Fill #1

## 2019-08-27 ENCOUNTER — Encounter: Payer: Self-pay | Admitting: Nurse Practitioner

## 2019-08-27 DIAGNOSIS — I10 Essential (primary) hypertension: Secondary | ICD-10-CM | POA: Diagnosis not present

## 2019-08-27 DIAGNOSIS — F329 Major depressive disorder, single episode, unspecified: Secondary | ICD-10-CM | POA: Diagnosis not present

## 2019-08-27 DIAGNOSIS — I700 Atherosclerosis of aorta: Secondary | ICD-10-CM | POA: Diagnosis not present

## 2019-08-27 DIAGNOSIS — E119 Type 2 diabetes mellitus without complications: Secondary | ICD-10-CM | POA: Diagnosis not present

## 2019-08-27 DIAGNOSIS — Z48815 Encounter for surgical aftercare following surgery on the digestive system: Secondary | ICD-10-CM | POA: Diagnosis not present

## 2019-08-27 DIAGNOSIS — F419 Anxiety disorder, unspecified: Secondary | ICD-10-CM | POA: Diagnosis not present

## 2019-08-27 NOTE — Progress Notes (Signed)
See other note

## 2019-08-30 DIAGNOSIS — F329 Major depressive disorder, single episode, unspecified: Secondary | ICD-10-CM | POA: Diagnosis not present

## 2019-08-30 DIAGNOSIS — I700 Atherosclerosis of aorta: Secondary | ICD-10-CM | POA: Diagnosis not present

## 2019-08-30 DIAGNOSIS — I10 Essential (primary) hypertension: Secondary | ICD-10-CM | POA: Diagnosis not present

## 2019-08-30 DIAGNOSIS — F419 Anxiety disorder, unspecified: Secondary | ICD-10-CM | POA: Diagnosis not present

## 2019-08-30 DIAGNOSIS — E119 Type 2 diabetes mellitus without complications: Secondary | ICD-10-CM | POA: Diagnosis not present

## 2019-08-30 DIAGNOSIS — Z48815 Encounter for surgical aftercare following surgery on the digestive system: Secondary | ICD-10-CM | POA: Diagnosis not present

## 2019-09-03 ENCOUNTER — Ambulatory Visit: Payer: Self-pay | Admitting: Nurse Practitioner

## 2019-09-04 DIAGNOSIS — Z48815 Encounter for surgical aftercare following surgery on the digestive system: Secondary | ICD-10-CM | POA: Diagnosis not present

## 2019-09-04 DIAGNOSIS — F329 Major depressive disorder, single episode, unspecified: Secondary | ICD-10-CM | POA: Diagnosis not present

## 2019-09-04 DIAGNOSIS — E119 Type 2 diabetes mellitus without complications: Secondary | ICD-10-CM | POA: Diagnosis not present

## 2019-09-04 DIAGNOSIS — F419 Anxiety disorder, unspecified: Secondary | ICD-10-CM | POA: Diagnosis not present

## 2019-09-04 DIAGNOSIS — I10 Essential (primary) hypertension: Secondary | ICD-10-CM | POA: Diagnosis not present

## 2019-09-04 DIAGNOSIS — I700 Atherosclerosis of aorta: Secondary | ICD-10-CM | POA: Diagnosis not present

## 2019-09-04 NOTE — ED Provider Notes (Signed)
[place]    CSN: [id number] Arrival date & time: 12/14/18  1113      History   Chief Complaint Chief Complaint  Patient presents with  . Abdominal Pain    HPI Allison Franco is a 78 y.o. female.   Pt complains of abdominal pain and fullness in her rectum.  Has had problems with constipation in the past     Past Medical History:  Diagnosis Date  . Anxiety   . Constipation   . Depression    "following husband's death"  . Diabetes mellitus without complication ([place])   . Hypertension   . KNEE PAIN, BILATERAL 02/13/2009   Qualifier: Diagnosis of  By: Hassell Done FNP, Tori Milks    . Tinnitus     Patient Active Problem List   Diagnosis Date Noted  . Inguinal hernia with bowel obstruction 08/07/2019  . Incarcerated left inguinal hernia 08/07/2019  . Anemia 08/31/2018  . Controlled type 2 diabetes mellitus with hyperglycemia ([place]) 08/31/2018  . Essential hypertension 07/03/2018  . Type 2 diabetes mellitus without complication, without long-term current use of insulin ([place]) 07/02/2018  . Grief reaction 10/20/2013  . Obesity 10/20/2013  . ALLERGIC RHINITIS 08/11/2009  . INSOMNIA 07/17/2008  . TIREDNESS 04/16/2008  . HYPERTENSION 01/24/2008    Past Surgical History:  Procedure Laterality Date  . ABDOMINAL HYSTERECTOMY    . COLON RESECTION N/A 08/07/2019   Procedure: DIAGNOSTIC LAPAROSCOPY WITH OPEN LEFT INGUINAL HERNIA WITH MESH;  Surgeon: Johnathan Hausen, MD;  Location: [facility];  Service: General;  Laterality: N/A;  . fribroid surgery      OB History   No obstetric history on file.      Home Medications    Prior to Admission medications   Medication Sig Start Date End Date Taking? Authorizing Provider  acetaminophen (TYLENOL) 500 MG tablet Take 1 tablet (500 mg total) by mouth every 6 (six) hours as needed for mild pain, moderate pain or headache. Reported on 05/10/2016 08/09/19   Kalman Drape, PA  Alcohol Swabs (ALCOHOL WIPES) 70 % PADS by Does not  apply route. Use with blood sugar check and injection of insulin    [provider]  blood glucose meter kit and supplies KIT Dispense based on patient and insurance preference. Use up to four times daily as directed. (FOR ICD-9 250.00, 250.01). 07/05/18   Florencia Reasons, MD  Calcium 600-200 MG-UNIT tablet Take 1 tablet by mouth as needed.     [provider]  Cholecalciferol (VITAMIN D) 50 MCG (2000 UT) tablet Take 2,000 Units by mouth daily.     [provider]  EPINEPHrine (EPIPEN 2-PAK) 0.3 mg/0.3 mL IJ SOAJ injection Inject 0.3 mLs (0.3 mg total) into the muscle once as needed (for severe allergic reaction). CAll 911 immediately if you have to use this medicine 09/10/17   Larene Pickett, PA-C  HYDROcodone-acetaminophen (NORCO/VICODIN) 5-325 MG tablet Take 1 tablet by mouth every 6 (six) hours as needed for moderate pain. Patient not taking: Reported on 08/20/2019 08/09/19   Kalman Drape, PA  metFORMIN (GLUCOPHAGE) 500 MG tablet TAKE 1 TABLET BY MOUTH 2 TIMES EVERY DAY WITH MORNING AND EVENING MEALS Patient taking differently: Take 500 mg by mouth 2 (two) times daily with a meal. Give w/food. 05/27/19   [name], FNP  Multiple Vitamin (MULTIVITAMIN WITH MINERALS) TABS tablet Take 1 tablet by mouth daily. Centrum    [provider]  olmesartan (BENICAR) 20 MG tablet TAKE 1 TABLET BY MOUTH  ONCE DAILY Patient taking differently: Take 20 mg by mouth daily.  07/29/19   [name], FNP    Family History Family History  Problem Relation Age of Onset  . Diabetes Mother   . Hypertension Father     Social History Social History   Tobacco Use  . Smoking status: Never Smoker  . Smokeless tobacco: Never Used  Substance Use Topics  . Alcohol use: No  . Drug use: No     Allergies   Aspirin, Hydroxyzine, and Valium [diazepam]   Review of Systems Review of Systems  Constitutional: Negative for chills and fever.  HENT: Negative for sore throat and  tinnitus.   Eyes: Negative for redness.  Respiratory: Negative for cough and shortness of breath.   Cardiovascular: Negative for chest pain and palpitations.  Gastrointestinal: Positive for abdominal distention, abdominal pain, constipation and rectal pain (with bowel movements). Negative for diarrhea, nausea and vomiting.  Genitourinary: Negative for dysuria, frequency and urgency.  Musculoskeletal: Negative for myalgias.  Skin: Negative for rash.       No lesions  Neurological: Negative for weakness.  Hematological: Does not bruise/bleed easily.  Psychiatric/Behavioral: Negative for suicidal ideas.     Physical Exam Triage Vital Signs ED Triage Vitals  Enc Vitals Group     BP 12/14/18 1310 122/82     Pulse Rate 12/14/18 1310 89     Resp 12/14/18 1310 16     Temp 12/14/18 1310 98 F (36.7 C)     Temp Source 12/14/18 1310 Oral     SpO2 12/14/18 1310 97 %     Weight --      Height --      Head Circumference --      Peak Flow --      Pain Score 12/14/18 1309 6     Pain Loc --      Pain Edu? --      Excl. in [place]? --    No data found.  Updated Vital Signs BP 122/82   Pulse 89   Temp 98 F (36.7 C) (Oral)   Resp 16   SpO2 97%   Visual Acuity Right Eye Distance:   Left Eye Distance:   Bilateral Distance:    Right Eye Near:   Left Eye Near:    Bilateral Near:     Physical Exam Vitals signs and nursing note reviewed.  Constitutional:      General: She is not in acute distress.    Appearance: She is well-developed.  HENT:     Head: Normocephalic and atraumatic.  Eyes:     General: No scleral icterus.    Conjunctiva/sclera: Conjunctivae normal.     Pupils: Pupils are equal, round, and reactive to light.  Neck:     Musculoskeletal: Normal range of motion and neck supple.     Thyroid: No thyromegaly.     Vascular: No JVD.     Trachea: No tracheal deviation.  Cardiovascular:     Rate and Rhythm: Normal rate and regular rhythm.     Heart sounds: Normal heart  sounds. No murmur. No friction rub. No gallop.   Pulmonary:     Effort: Pulmonary effort is normal.     Breath sounds: Normal breath sounds.  Abdominal:     General: Bowel sounds are normal. There is no distension.     Palpations: Abdomen is soft.     Tenderness: There is generalized abdominal tenderness.  Musculoskeletal: Normal range of motion.  Lymphadenopathy:  Cervical: No cervical adenopathy.  Skin:    General: Skin is warm and dry.  Neurological:     Mental Status: She is alert and oriented to person, place, and time.     Cranial Nerves: No cranial nerve deficit.  Psychiatric:        Behavior: Behavior normal.        Thought Content: Thought content normal.        Judgment: Judgment normal.      UC Treatments / Results  Labs (all labs ordered are listed, but only abnormal results are displayed) Labs Reviewed - No data to display  EKG   Radiology No results found.  Procedures Procedures (including critical care time)  Medications Ordered in UC Medications - No data to display  Initial Impression / Assessment and Plan / UC Course  I have reviewed the triage vital signs and the nursing notes.  Pertinent labs & imaging results that were available during my care of the patient were reviewed by me and considered in my medical decision making (see chart for details).     The patient is still constipated despite some  softer stools. Increase bowel regimen.  Final Clinical Impressions(s) / UC Diagnoses   Final diagnoses:  Constipation, unspecified constipation type   Discharge Instructions   None    ED Prescriptions    Medication Sig Dispense Auth. Provider   polyethylene glycol (MIRALAX / GLYCOLAX) packet Take 17 g by mouth every other day. Patient not taking:  Reported on 01/10/2019 14 each Harrie Foreman, MD   lactulose (CHRONULAC) 10 GM/15ML solution Take 15 mLs (10 g total) by mouth daily for 5 days. 75 mL Harrie Foreman, MD     PDMP not  reviewed this encounter.   Harrie Foreman, MD 09/04/19 2211

## 2019-09-05 MED FILL — CloNIDine HCL 0.1 MG TAB: 0.1 | 10 days supply | Qty: 10 | Fill #0

## 2019-09-23 ENCOUNTER — Other Ambulatory Visit: Payer: Self-pay | Admitting: Nurse Practitioner

## 2019-09-23 MED FILL — OLMESARTAN MEDOXOMIL 20 MG: 20 | 30 days supply | Qty: 30 | Fill #0

## 2019-09-27 MED FILL — LISINOPRIL 10 MG TABS: 10 | 30 days supply | Qty: 30 | Fill #1

## 2019-10-08 DIAGNOSIS — H90A21 Sensorineural hearing loss, unilateral, right ear, with restricted hearing on the contralateral side: Secondary | ICD-10-CM | POA: Diagnosis not present

## 2019-10-08 DIAGNOSIS — H9311 Tinnitus, right ear: Secondary | ICD-10-CM | POA: Diagnosis not present

## 2019-10-09 ENCOUNTER — Other Ambulatory Visit: Payer: Self-pay

## 2019-10-09 DIAGNOSIS — Z20822 Contact with and (suspected) exposure to covid-19: Secondary | ICD-10-CM

## 2019-10-09 MED FILL — LISINOPRIL 10 MG TABS: 10 | 30 days supply | Qty: 30 | Fill #1

## 2019-10-10 ENCOUNTER — Telehealth: Payer: Self-pay

## 2019-10-10 ENCOUNTER — Ambulatory Visit: Payer: Medicare Other | Admitting: Nurse Practitioner

## 2019-10-10 ENCOUNTER — Ambulatory Visit: Payer: Medicare Other

## 2019-10-10 NOTE — Telephone Encounter (Signed)
This nurse attempted to call patient in order to follow up on no show to today's appointment for AWV. Message was left on voicemail for patient to call office back in order to reschedule.

## 2019-10-11 ENCOUNTER — Other Ambulatory Visit: Payer: Self-pay

## 2019-10-11 DIAGNOSIS — Z20822 Contact with and (suspected) exposure to covid-19: Secondary | ICD-10-CM

## 2019-10-11 LAB — NOVEL CORONAVIRUS, NAA: SARS-CoV-2, NAA: NOT DETECTED

## 2019-10-13 ENCOUNTER — Emergency Department (HOSPITAL_COMMUNITY)
Admission: EM | Admit: 2019-10-13 | Discharge: 2019-10-13 | Disposition: A | Discharge status: Home / Self Care | Payer: Medicare Other | Attending: Emergency Medicine | Admitting: Emergency Medicine

## 2019-10-13 ENCOUNTER — Encounter (HOSPITAL_COMMUNITY): Payer: Self-pay

## 2019-10-13 ENCOUNTER — Emergency Department (HOSPITAL_COMMUNITY): Payer: Medicare Other

## 2019-10-13 DIAGNOSIS — R0602 Shortness of breath: Secondary | ICD-10-CM | POA: Diagnosis not present

## 2019-10-13 DIAGNOSIS — Z79899 Other long term (current) drug therapy: Secondary | ICD-10-CM | POA: Insufficient documentation

## 2019-10-13 DIAGNOSIS — H9319 Tinnitus, unspecified ear: Secondary | ICD-10-CM

## 2019-10-13 DIAGNOSIS — R457 State of emotional shock and stress, unspecified: Secondary | ICD-10-CM | POA: Diagnosis not present

## 2019-10-13 DIAGNOSIS — H9313 Tinnitus, bilateral: Secondary | ICD-10-CM | POA: Diagnosis not present

## 2019-10-13 DIAGNOSIS — Z7984 Long term (current) use of oral hypoglycemic drugs: Secondary | ICD-10-CM | POA: Insufficient documentation

## 2019-10-13 DIAGNOSIS — E119 Type 2 diabetes mellitus without complications: Secondary | ICD-10-CM | POA: Diagnosis not present

## 2019-10-13 DIAGNOSIS — I10 Essential (primary) hypertension: Secondary | ICD-10-CM

## 2019-10-13 LAB — URINALYSIS, ROUTINE W REFLEX MICROSCOPIC
Bilirubin Urine: NEGATIVE
Glucose, UA: NEGATIVE mg/dL
Hgb urine dipstick: NEGATIVE
Ketones, ur: NEGATIVE mg/dL
Leukocytes,Ua: NEGATIVE
Nitrite: NEGATIVE
Protein, ur: NEGATIVE mg/dL
Specific Gravity, Urine: 1.003 — ABNORMAL LOW (ref 1.005–1.030)
pH: 7 (ref 5.0–8.0)

## 2019-10-13 LAB — CBC
HCT: 40.2 % (ref 36.0–46.0)
Hemoglobin: 12.6 g/dL (ref 12.0–15.0)
MCH: 28.3 pg (ref 26.0–34.0)
MCHC: 31.3 g/dL (ref 30.0–36.0)
MCV: 90.1 fL (ref 80.0–100.0)
Platelets: 239 10*3/uL (ref 150–400)
RBC: 4.46 MIL/uL (ref 3.87–5.11)
RDW: 12.2 % (ref 11.5–15.5)
WBC: 7.5 10*3/uL (ref 4.0–10.5)
nRBC: 0 % (ref 0.0–0.2)

## 2019-10-13 LAB — BASIC METABOLIC PANEL
Anion gap: 6 (ref 5–15)
BUN: 14 mg/dL (ref 8–23)
CO2: 25 mmol/L (ref 22–32)
Calcium: 8.9 mg/dL (ref 8.9–10.3)
Chloride: 101 mmol/L (ref 98–111)
Creatinine, Ser: 0.63 mg/dL (ref 0.44–1.00)
GFR calc Af Amer: >60 mL/min (ref >60)
GFR calc non Af Amer: >60 mL/min (ref >60)
Glucose, Bld: 163 mg/dL — ABNORMAL HIGH (ref 70–99)
Potassium: 3.9 mmol/L (ref 3.5–5.1)
Sodium: 132 mmol/L — ABNORMAL LOW (ref 135–145)

## 2019-10-13 LAB — TROPONIN I (HIGH SENSITIVITY)
Troponin I (High Sensitivity): 9 ng/L (ref <18)
Troponin I (High Sensitivity): 10 ng/L (ref <18)

## 2019-10-13 NOTE — ED Triage Notes (Signed)
Pt presents with c/o tinnitus, reports she has had these symptoms for 3 months, reports it does get worse with the weather change. Pt did have some anxiety on EMS arrival as well as elevated blood pressure. Pt has a hx of hypertension. Pt denies any pain, dizziness, headache, or blurred vision.

## 2019-10-13 NOTE — ED Provider Notes (Signed)
  Face-to-face evaluation   History: She complains of ringing in her ears for several months and feels like the discomfort causes her blood pressure to go up.  She has been evaluated by ENT and recommended to have hearing aids.  Physical exam: Alert, anxious. No dysarthria. No aphasia.  Normal strength.  No respiratory distress.  Medical screening examination/treatment/procedure(s) were conducted as a shared visit with non-physician practitioner(s) and myself.  I personally evaluated the patient during the Tillie Rung, MD 10/14/19 1047

## 2019-10-13 NOTE — ED Provider Notes (Addendum)
[facility] DEPT Provider Note   CSN: [id number] Arrival date & time: 10/13/19  1116     History   Chief Complaint Chief Complaint  Patient presents with   Tinnitus   Hypertension    HPI Allison Franco is a 78 y.o. female history of hypertension, DM 2, tinnitus     HPI  Presents today with chief complaint of ringing in her ears which she states has been intermittent for approximately 3 years.  States that when this ringing occurs it causes her to have high blood pressure.  Patient states she took her BP meds at 7am this morning and felt well until approximately 9am when she started having ringing in her ears and felt upset and then started having chest pressure, dampness of her feet, SOB and heat in her chest that lasted 5 minutes during which time she checked her BP and it was 154/90. Patient states she has persistent ringing in her ears now but no other symptoms. States she has seen Dr. Valetta Mole with ENT at [facility] who told her she needs a hearing aid but she states she has been unable to procure one due to having difficulty with Medicaid/Medicare paying for the hearing aids.  Patient denies any current chest pain, chest pressure, headache, dizziness, shortness of breath, nausea, diaphoresis.   Past Medical History:  Diagnosis Date   Anxiety    Constipation    Depression    "following husband's death"   Diabetes mellitus without complication ([place])    Hypertension    KNEE PAIN, BILATERAL 02/13/2009   Qualifier: Diagnosis of  By: Hassell Done FNP, Tori Milks     Tinnitus     Patient Active Problem List   Diagnosis Date Noted   Inguinal hernia with bowel obstruction 08/07/2019   Incarcerated left inguinal hernia 08/07/2019   Anemia 08/31/2018   Controlled type 2 diabetes mellitus with hyperglycemia ([place]) 08/31/2018   Essential hypertension 07/03/2018   Type 2 diabetes mellitus without complication, without long-term current use of  insulin ([place]) 07/02/2018   Grief reaction 10/20/2013   Obesity 10/20/2013   ALLERGIC RHINITIS 08/11/2009   INSOMNIA 07/17/2008   TIREDNESS 04/16/2008   HYPERTENSION 01/24/2008    Past Surgical History:  Procedure Laterality Date   ABDOMINAL HYSTERECTOMY     COLON RESECTION N/A 08/07/2019   Procedure: DIAGNOSTIC LAPAROSCOPY WITH OPEN LEFT INGUINAL HERNIA WITH MESH;  Surgeon: Johnathan Hausen, MD;  Location: [facility];  Service: General;  Laterality: N/A;   fribroid surgery       OB History   No obstetric history on file.      Home Medications    Prior to Admission medications   Medication Sig Start Date End Date Taking? Authorizing Provider  acetaminophen (TYLENOL) 500 MG tablet Take 1 tablet (500 mg total) by mouth every 6 (six) hours as needed for mild pain, moderate pain or headache. Reported on 05/10/2016 08/09/19  Yes Focht, Jessica L, PA  metFORMIN (GLUCOPHAGE) 500 MG tablet TAKE 1 TABLET BY MOUTH 2 TIMES EVERY DAY WITH MORNING AND EVENING MEALS Patient taking differently: Take 500 mg by mouth 2 (two) times daily with a meal. Give w/food. 05/27/19  Yes [name], FNP  Multiple Vitamin (MULTIVITAMIN WITH MINERALS) TABS tablet Take 1 tablet by mouth daily. Centrum   Yes [provider]  olmesartan (BENICAR) 20 MG tablet TAKE 1 TABLET BY MOUTH ONCE DAILY Patient taking differently: Take 20 mg by mouth daily.  09/23/19  Yes [name], FNP  Alcohol Swabs (ALCOHOL WIPES) 70 % PADS by Does not apply route. Use with blood sugar check and injection of insulin    [provider]  blood glucose meter kit and supplies KIT Dispense based on patient and insurance preference. Use up to four times daily as directed. (FOR ICD-9 250.00, 250.01). 07/05/18   Florencia Reasons, MD  EPINEPHrine (EPIPEN 2-PAK) 0.3 mg/0.3 mL IJ SOAJ injection Inject 0.3 mLs (0.3 mg total) into the muscle once as needed (for severe allergic reaction). CAll 911 immediately if you have to use this  medicine 09/10/17   Larene Pickett, PA-C  HYDROcodone-acetaminophen (NORCO/VICODIN) 5-325 MG tablet Take 1 tablet by mouth every 6 (six) hours as needed for moderate pain. Patient not taking: Reported on 08/20/2019 08/09/19   Kalman Drape, PA    Family History Family History  Problem Relation Age of Onset   Diabetes Mother    Hypertension Father     Social History Social History   Tobacco Use   Smoking status: Never Smoker   Smokeless tobacco: Never Used  Substance Use Topics   Alcohol use: No   Drug use: No     Allergies   Aspirin, Hydroxyzine, and Valium [diazepam]   Review of Systems Review of Systems  Constitutional: Negative for chills and fever.  HENT: Positive for tinnitus.   Respiratory: Positive for chest tightness and shortness of breath.   Genitourinary: Negative for dysuria.  Musculoskeletal: Negative for back pain, neck pain and neck stiffness.  Neurological: Negative for dizziness and light-headedness.     Physical Exam Updated Vital Signs BP (!) 143/73 (BP Location: Right Arm)    Pulse 88    Temp 97.9 F (36.6 C) (Oral)    Resp (!) 24    SpO2 97%   Physical Exam Vitals signs and nursing note reviewed.  Constitutional:      General: She is not in acute distress. HENT:     Head: Normocephalic and atraumatic.     Nose: Nose normal.  Eyes:     General: No scleral icterus. Neck:     Musculoskeletal: Normal range of motion.  Cardiovascular:     Rate and Rhythm: Normal rate and regular rhythm.     Pulses: Normal pulses.     Heart sounds: Normal heart sounds.  Pulmonary:     Effort: Pulmonary effort is normal. No respiratory distress.     Breath sounds: No wheezing.     Comments: Patient is in no respiratory distress, no increased work of breathing, speaking full sentences, lungs clear to auscultation bilaterally. Abdominal:     Palpations: Abdomen is soft.     Tenderness: There is no abdominal tenderness.  Musculoskeletal:     Right  lower leg: Edema present.     Left lower leg: Edema present.     Comments: Trace bilateral lower extremity edema 1+ to the ankle  Skin:    General: Skin is warm and dry.     Capillary Refill: Capillary refill takes less than 2 seconds.  Neurological:     Mental Status: She is alert. Mental status is at baseline.  Psychiatric:        Mood and Affect: Mood normal.        Behavior: Behavior normal.      ED Treatments / Results  Labs (all labs ordered are listed, but only abnormal results are displayed) Labs Reviewed  BASIC METABOLIC PANEL - Abnormal; Notable for the following components:      Result  Value   Sodium 132 (*)    Glucose, Bld 163 (*)    All other components within normal limits  URINALYSIS, ROUTINE W REFLEX MICROSCOPIC - Abnormal; Notable for the following components:   Color, Urine COLORLESS (*)    Specific Gravity, Urine 1.003 (*)    All other components within normal limits  CBC  TROPONIN I (HIGH SENSITIVITY)  TROPONIN I (HIGH SENSITIVITY)    EKG EKG Interpretation  Date/Time:  Sunday October 13 2019 12:14:58 EST Ventricular Rate:  68 PR Interval:    QRS Duration: 82 QT Interval:  376 QTC Calculation: 400 R Axis:   29 Text Interpretation: Sinus rhythm Probable left atrial enlargement Borderline low voltage, extremity leads since last tracing no significant change Confirmed by Daleen Bo [phone]) on 10/13/2019 1:11:36 PM Also confirmed by Daleen Bo [phone]), editor Hattie Perch [phone])  on 10/13/2019 1:53:58 PM   Radiology Dg Chest Portable 1 View  Result Date: 10/13/2019 CLINICAL DATA:  Shortness of breath. Tinnitus. EXAM: PORTABLE CHEST 1 VIEW COMPARISON:  07/20/2018 FINDINGS: Borderline enlarged cardiac silhouette. Mildly tortuous aorta. Stable minimal scarring at the left lateral lung base. Otherwise, clear lungs with normal vascularity. Thoracic spine degenerative changes. IMPRESSION: No acute abnormality.  Interval borderline  cardiomegaly. Electronically Signed   By: Claudie Revering M.D.   On: 10/13/2019 13:15    Procedures Procedures (including critical care time)  Medications Ordered in ED Medications - No data to display   Initial Impression / Assessment and Plan / ED Course  I have reviewed the triage vital signs and the nursing notes.  Pertinent labs & imaging results that were available during my care of the patient were reviewed by me and considered in my medical decision making (see chart for details).  Clinical Course as of Oct 12 1258  Sun Oct 13, 2019  1255 Urinalysis is benign  Urinalysis, Routine w reflex microscopic(!) [WF]  1255 Negative for leukocytosis.  No abnormal findings.  CBC [WF]  1255 Temp: 97.9 F (36.6 C) [WF]  1255 BP(!): 189/91 [WF]  1255 Blood pressure elevated today.  Patient attributes this to ringing in her years.   [WF]    Clinical Course User Index [WF] Tedd Sias, [place]       Presents for chief complaint of ringing in ears and 5-minute episode earlier today of chest pressure, shortness of breath, sweating in her feet.  Will obtain chest x-ray, troponin x2, EKG, BMP, CBC and placed on cardiac monitor  Will consult case management to discuss possibility of getting patient hearing aids. Discussed with case management who states patient requires prescription for hearing aids from her PCP and evaluation by audiologists.   Low suspicion for ACS however patient is elderly, female with DM and HTN so low threshold for cardiac workup.   Patient with benign chest x-ray evaluated by me no cardiomegaly or infiltrate.  I discussed this case with my attending physician who cosigned this note including patient's presenting symptoms, physical exam, and planned diagnostics and interventions. Attending physician stated agreement with plan or made changes to plan which were implemented.   Attending physician assessed patient at bedside.   Discussed at length with patient  necessity of following up with audiology. Patient understands recommendation. Will discharge with audiology FU information.   On review of Dr. Victorio Palm note The patient returns for follow-up appointment. She was evaluated in June 2020 and we recommended hearing aid evaluation and possible hearing amplification primarily in the right ear as tinnitus masking.  The patient was unable to afford a hearing aid and sought care at [facility] audiology. 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 Otherwise stable, monitor for additional concerns. Plan reevaluation through the audiology department, the patient's new healthcare/insurance may cover a portion of the cost of hearing amplification. Plan follow-up with me as needed.    Final Clinical Impressions(s) / ED Diagnoses   Final diagnoses:  Tinnitus, unspecified laterality  Hypertension, unspecified type    ED Discharge Orders    None       Tedd Sias, [place] 10/13/19 1902    Valarie Merino, MD 10/13/19 2124  Medical screening examination/treatment/procedure(s) were conducted as a shared visit with non-physician practitioner(s) and myself.  I personally evaluated the patient during the encounter.  EKG Interpretation  Date/Time:  Sunday October 13 2019 12:14:58 EST Ventricular Rate:  68 PR Interval:    QRS Duration: 82 QT Interval:  376 QTC Calculation: 400 R Axis:   29 Text Interpretation: Sinus rhythm Probable left atrial enlargement Borderline low voltage, extremity leads since last tracing no significant change Confirmed by Daleen Bo [phone] on 10/13/2019 1:11:36 PM Also confirmed by Daleen Bo [phone]), editor Hattie Perch ([id number])  on 10/13/2019 1:53:58 PM     Daleen Bo, MD 10/14/19 1047

## 2019-10-13 NOTE — Care Management (Signed)
ED CM noted a CM consult for assistance with hearing aids. Patient would need to obtain a prescription from her PCP.  Updated EDP at [facility]. No further ED CM needs identified.

## 2019-10-13 NOTE — Discharge Instructions (Signed)
Follow-up with audiology please give them a call Monday morning to schedule appointment.  Take your blood pressure medications as prescribed.  Continue to see your primary care doctor for blood pressure medication.

## 2019-10-14 ENCOUNTER — Telehealth: Payer: Self-pay

## 2019-10-14 DIAGNOSIS — Z20828 Contact with and (suspected) exposure to other viral communicable diseases: Secondary | ICD-10-CM | POA: Diagnosis not present

## 2019-10-14 LAB — NOVEL CORONAVIRUS, NAA: SARS-CoV-2, NAA: NOT DETECTED

## 2019-10-15 ENCOUNTER — Telehealth: Payer: Self-pay | Admitting: Nurse Practitioner

## 2019-10-15 ENCOUNTER — Ambulatory Visit (INDEPENDENT_AMBULATORY_CARE_PROVIDER_SITE_OTHER): Payer: Medicare Other | Admitting: Pharmacist

## 2019-10-15 DIAGNOSIS — I10 Essential (primary) hypertension: Secondary | ICD-10-CM

## 2019-10-15 DIAGNOSIS — E119 Type 2 diabetes mellitus without complications: Secondary | ICD-10-CM

## 2019-10-15 NOTE — Telephone Encounter (Signed)
I called the patient to reschedule AWV & OV.  There was no answer, and the voicemail was full.

## 2019-10-17 NOTE — Telephone Encounter (Signed)
Left message asking pt to call and reschedule AWV & OV

## 2019-10-20 NOTE — Progress Notes (Signed)
Chronic Care Management   Visit Note  10/15/2019 Name: Allison Franco MRN: [id number] DOB: [birth year]  Referred by: Allison Brine, FNP Reason for referral : Chronic Care Management   Allison Franco is a 78 y.o. year old female who is a primary care patient of Allison Franco, [place]. The CCM team was consulted for assistance with chronic disease management and care coordination needs related to HTN and DMII  Review of patient status, including review of consultants reports, relevant laboratory and other test results, and collaboration with appropriate care team members and the patient's provider was performed as part of comprehensive patient evaluation and provision of chronic care management services.    I spoke with Allison Franco by telephone today.  Medications: Outpatient Encounter Medications as of 10/15/2019  Medication Sig  . acetaminophen (TYLENOL) 500 MG tablet Take 1 tablet (500 mg total) by mouth every 6 (six) hours as needed for mild pain, moderate pain or headache. Reported on 05/10/2016  . Alcohol Swabs (ALCOHOL WIPES) 70 % PADS by Does not apply route. Use with blood sugar check and injection of insulin  . blood glucose meter kit and supplies KIT Dispense based on patient and insurance preference. Use up to four times daily as directed. (FOR ICD-9 250.00, 250.01).  Allison Franco EPINEPHrine (EPIPEN 2-PAK) 0.3 mg/0.3 mL IJ SOAJ injection Inject 0.3 mLs (0.3 mg total) into the muscle once as needed (for severe allergic reaction). CAll 911 immediately if you have to use this medicine  . HYDROcodone-acetaminophen (NORCO/VICODIN) 5-325 MG tablet Take 1 tablet by mouth every 6 (six) hours as needed for moderate pain. (Patient not taking: Reported on 08/20/2019)  . metFORMIN (GLUCOPHAGE) 500 MG tablet TAKE 1 TABLET BY MOUTH 2 TIMES EVERY DAY WITH MORNING AND EVENING MEALS (Patient taking differently: Take 500 mg by mouth 2 (two) times daily with a meal. Give w/food.)  . Multiple Vitamin (MULTIVITAMIN WITH  MINERALS) TABS tablet Take 1 tablet by mouth daily. Centrum  . olmesartan (BENICAR) 20 MG tablet TAKE 1 TABLET BY MOUTH ONCE DAILY (Patient taking differently: Take 20 mg by mouth daily. )   No facility-administered encounter medications on file as of 10/15/2019.      Objective:   Goals Addressed            This Visit's Progress     Patient Stated   . I would like to continue managing my diabetes and blood pressure (pt-stated)       Current Barriers:  . Language Barriers . Financial Barriers  Pharmacist Clinical Goal(s):  Allison Franco Over the next 90 days, patient will work with PharmD and PCP  to address needs related to optimized medication management of chronic conditions  Interventions: Call completed with patient on 10/15/19 . Comprehensive medication review performed.  No changes have been made to medications.  Patient denies adverse events. . Advised patient to continue taking medications as prescribed and checking BGs every morning as directed by PCP. Allison Franco Patient set up for continued pharmacy delivery through [facility]) . Patient continues to check BG most every morning.  It ranges from 80-100 depending on her diet. Last A1c was 6.6% on 08/06/2019.  Allison Franco She states her blood pressure has also been at goal <130/80.  She denies BP issues.   . She states she needs a new hearing aid and that her hearing is th main problem.  Unfortunately, there are no resources to assist with this.  Patient has been complaining of tinnitus chronically.  Patient's current medications  reviewed for tinnitus risk.  Angiotensin receptor blockers (ARBs-pt on olmesartan) --low tinnitus risk.  All of patient's other medications are unlikely to cause tinnitus as well. . Discussed plans with patient for ongoing care management follow up and provided patient with direct contact information for care management team . Discussed patient case with CCM team who will be reaching out to patient in the next  month  Patient Self Care Activities:  . Self administers medications as prescribed . Attends all scheduled provider appointments . Calls pharmacy for medication refills  Please see past updates related to this goal by clicking on the "Past Updates" button in the selected goal          Plan:   The care management team will reach out to the patient again over the next 30 days.     Provider Signature Regina Eck, PharmD, BCPS Clinical Pharmacist, [facility] [place]: [phone]

## 2019-10-20 NOTE — Patient Instructions (Signed)
Visit Information  Goals Addressed            This Visit's Progress     Patient Stated   . I would like to continue managing my diabetes and blood pressure (pt-stated)       Current Barriers:  . Language Barriers . Financial Barriers  Pharmacist Clinical Goal(s):  [name] Over the next 90 days, patient will work with PharmD and PCP  to address needs related to optimized medication management of chronic conditions  Interventions: Call completed with patient on 10/15/19 . Comprehensive medication review performed.  No changes have been made to medications.  Patient denies adverse events. . Advised patient to continue taking medications as prescribed and checking BGs every morning as directed by PCP. [name] Patient set up for continued pharmacy delivery through [facility]) . Patient continues to check BG most every morning.  It ranges from 80-100 depending on her diet. Last A1c was 6.6% on 08/06/2019.  [name] She states her blood pressure has also been at goal <130/80.  She denies BP issues.   . She states she needs a new hearing aid and that her hearing is th main problem.  Unfortunately, there are no resources to assist with this.  Patient has been complaining of tinnitus chronically.  Patient's current medications reviewed for tinnitus risk.  Angiotensin receptor blockers (ARBs-pt on olmesartan) --low tinnitus risk.  All of patient's other medications are unlikely to cause tinnitus as well. . Discussed plans with patient for ongoing care management follow up and provided patient with direct contact information for care management team . Discussed patient case with CCM team who will be reaching out to patient in the next month  Patient Self Care Activities:  . Self administers medications as prescribed . Attends all scheduled provider appointments . Calls pharmacy for medication refills  Please see past updates related to this goal by clicking on the "Past Updates" button in the  selected goal         The patient verbalized understanding of instructions provided today and declined a print copy of patient instruction materials.   The care management team will reach out to the patient again over the next 30 days.   SIGNATURE Regina Eck, PharmD, BCPS Clinical Pharmacist, [facility] [place]: [phone]

## 2019-10-23 ENCOUNTER — Telehealth: Payer: Self-pay

## 2019-10-23 ENCOUNTER — Ambulatory Visit: Payer: Self-pay

## 2019-10-23 DIAGNOSIS — E119 Type 2 diabetes mellitus without complications: Secondary | ICD-10-CM

## 2019-10-23 NOTE — Chronic Care Management (AMB) (Signed)
  Chronic Care Management   Outreach Note  10/23/2019 Name: Allison Franco MRN: [id number] DOB: 24-Aug-1941  Referred by: [name], FNP Reason for referral : Care Coordination   An unsuccessful telephone outreach was attempted today. The patient was referred to the case management team by for assistance with care management and care coordination.   Follow Up Plan: A HIPPA compliant phone message was left for the patient providing contact information and requesting a return call.  The care management team will reach out to the patient again over the next 14 days.   Daneen Schick, BSW, CDP Social Worker, Certified Dementia Practitioner [place] [facility] [place] [facility] [phone]

## 2019-10-28 ENCOUNTER — Telehealth: Payer: Self-pay | Admitting: Nurse Practitioner

## 2019-10-28 NOTE — Telephone Encounter (Signed)
I left a message asking the pt to call me and reschedule AWV with Pamala Hurry and OV w/ Janece.

## 2019-11-07 ENCOUNTER — Telehealth: Payer: Self-pay

## 2019-11-08 ENCOUNTER — Ambulatory Visit: Payer: Self-pay

## 2019-11-08 ENCOUNTER — Telehealth: Payer: Self-pay

## 2019-11-08 DIAGNOSIS — E119 Type 2 diabetes mellitus without complications: Secondary | ICD-10-CM

## 2019-11-08 DIAGNOSIS — I10 Essential (primary) hypertension: Secondary | ICD-10-CM

## 2019-11-08 NOTE — Chronic Care Management (AMB) (Signed)
  Chronic Care Management   Outreach Note  11/08/2019 Name: Allison Franco MRN: [id number] DOB: [birth year]  Referred by: [name], FNP Reason for referral : Care Coordination   A second unsuccessful telephone outreach was attempted today. The patient was referred to the case management team for assistance with care management and care coordination.   Follow Up Plan: A HIPPA compliant phone message was left for the patient providing contact information and requesting a return call.  The care management team will reach out to the patient again over the next 14 days.   Daneen Schick, BSW, CDP Social Worker, Certified Dementia Practitioner [place] [facility] [place] [facility] [phone]

## 2019-11-14 ENCOUNTER — Telehealth: Payer: Self-pay

## 2019-11-14 ENCOUNTER — Ambulatory Visit: Payer: Self-pay

## 2019-11-14 DIAGNOSIS — E119 Type 2 diabetes mellitus without complications: Secondary | ICD-10-CM

## 2019-11-14 NOTE — Chronic Care Management (AMB) (Signed)
  Chronic Care Management   Outreach Note  11/14/2019 Name: Allison Franco MRN: [id number] DOB: [birth year]  Referred by: [name], FNP Reason for referral : Care Coordination   Third unsuccessful outbound call placed to the patient to assist with care coordination needs. SW left a HIPAA compliant voice message requesting a return call.  Follow Up Plan: No SW follow up planned at this time due to an inability to maintain patient contact. CM SW is available to assist patient with future care coordination needs.  Daneen Schick, BSW, CDP Social Worker, Certified Dementia Practitioner [place] [facility] [place] [facility] [phone]

## 2019-12-02 ENCOUNTER — Telehealth: Payer: Medicare Other | Admitting: Pharmacist

## 2019-12-10 ENCOUNTER — Telehealth: Payer: Self-pay | Admitting: Nurse Practitioner

## 2019-12-10 NOTE — Telephone Encounter (Signed)
I called the patient to schedule AWV, but there was no answer and no option to leave a message. VDM (DD) °

## 2020-01-09 ENCOUNTER — Ambulatory Visit: Payer: Medicare Other | Admitting: Nurse Practitioner

## 2020-01-13 ENCOUNTER — Other Ambulatory Visit: Payer: Self-pay | Admitting: Nurse Practitioner

## 2020-01-15 ENCOUNTER — Ambulatory Visit (INDEPENDENT_AMBULATORY_CARE_PROVIDER_SITE_OTHER): Payer: Medicare Other | Admitting: Nurse Practitioner

## 2020-01-15 ENCOUNTER — Other Ambulatory Visit: Payer: Self-pay

## 2020-01-15 ENCOUNTER — Ambulatory Visit (INDEPENDENT_AMBULATORY_CARE_PROVIDER_SITE_OTHER): Payer: Medicare Other

## 2020-01-15 VITALS — BP 126/84 | HR 72 | Temp 97.8°F | Ht 63.0 in | Wt 185.0 lb

## 2020-01-15 VITALS — BP 126/84 | HR 72 | Temp 97.8°F | Ht 63.0 in | Wt 185.4 lb

## 2020-01-15 DIAGNOSIS — Z23 Encounter for immunization: Secondary | ICD-10-CM | POA: Diagnosis not present

## 2020-01-15 DIAGNOSIS — E119 Type 2 diabetes mellitus without complications: Secondary | ICD-10-CM

## 2020-01-15 DIAGNOSIS — K0889 Other specified disorders of teeth and supporting structures: Secondary | ICD-10-CM

## 2020-01-15 DIAGNOSIS — Z0000 Encounter for general adult medical examination without abnormal findings: Secondary | ICD-10-CM

## 2020-01-15 DIAGNOSIS — I10 Essential (primary) hypertension: Secondary | ICD-10-CM | POA: Diagnosis not present

## 2020-01-15 MED ORDER — BOOSTRIX 5-2.5-18.5 LF-MCG/0.5 IM SUSP: 0.5000 mL | Freq: Once | INTRAMUSCULAR | 0 refills | Status: AC

## 2020-01-15 MED ORDER — PREVNAR 13 IM SUSP: 0.5000 mL | INTRAMUSCULAR | 0 refills | Status: AC

## 2020-01-15 MED ORDER — AMOXICILLIN 500 MG PO CAPS: ORAL_CAPSULE | ORAL | 0 refills | Status: DC

## 2020-01-15 NOTE — Progress Notes (Signed)
This visit occurred during the SARS-CoV-2 public health emergency.  Safety protocols were in place, including screening questions prior to the visit, additional usage of staff PPE, and extensive cleaning of exam room while observing appropriate contact time as indicated for disinfecting solutions.  Subjective:     Patient ID: Allison Franco , female    DOB: [birth year] , 79 y.o.   MRN: [id number]   Chief Complaint  Patient presents with  . Diabetes  . Medical Clearance    HPI  She went to [place] in November, arrived back here on February 7th.   Wt Readings from Last 3 Encounters: 01/15/20 : 185 lb 6.4 oz (84.1 kg) 08/20/19 : 180 lb (81.6 kg) 08/07/19 : 180 lb (81.6 kg)  She started having took pain in the last month, she says it is about to fall out  Diabetes She presents for her follow-up diabetic visit. She has type 2 diabetes mellitus. There are no hypoglycemic associated symptoms. Pertinent negatives for hypoglycemia include no confusion, dizziness or nervousness/anxiousness. There are no diabetic associated symptoms. Pertinent negatives for diabetes include no chest pain, no fatigue, no polydipsia, no polyphagia and no polyuria. Symptoms are stable. There are no diabetic complications. Risk factors for coronary artery disease include sedentary lifestyle and obesity. When asked about current treatments, none were reported. She is following a generally healthy diet. When asked about meal planning, she reported none. She has not had a previous visit with a dietitian. She rarely participates in exercise. An ACE inhibitor/angiotensin II receptor blocker is being taken. She does not see a podiatrist.Eye exam is not current.     Past Medical History:  Diagnosis Date  . Anxiety   . Constipation   . Depression    "following husband's death"  . Diabetes mellitus without complication ([place])   . Hypertension   . KNEE PAIN, BILATERAL 02/13/2009   Qualifier: Diagnosis of  By: Hassell Done FNP,  Tori Milks    . Tinnitus      Family History  Problem Relation Age of Onset  . Diabetes Mother   . Hypertension Father      Current Outpatient Medications:  .  acetaminophen (TYLENOL) 500 MG tablet, Take 1 tablet (500 mg total) by mouth every 6 (six) hours as needed for mild pain, moderate pain or headache. Reported on 05/10/2016, Disp: 30 tablet, Rfl: 0 .  blood glucose meter kit and supplies KIT, Dispense based on patient and insurance preference. Use up to four times daily as directed. (FOR ICD-9 250.00, 250.01)., Disp: 1 each, Rfl: 0 .  metFORMIN (GLUCOPHAGE) 500 MG tablet, TAKE 1 TABLET BY MOUTH 2 TIMES EVERY DAY WITH MORNING AND EVENING MEALS (Patient taking differently: Take 500 mg by mouth 2 (two) times daily with a meal. Give w/food.), Disp: 90 tablet, Rfl: 0 .  Multiple Vitamin (MULTIVITAMIN WITH MINERALS) TABS tablet, Take 1 tablet by mouth daily. Centrum, Disp: , Rfl:  .  olmesartan (BENICAR) 20 MG tablet, TAKE 1 TABLET BY MOUTH ONCE DAILY, Disp: 60 tablet, Rfl: 1 .  Alcohol Swabs (ALCOHOL WIPES) 70 % PADS, by Does not apply route. Use with blood sugar check and injection of insulin, Disp: , Rfl:  .  EPINEPHrine (EPIPEN 2-PAK) 0.3 mg/0.3 mL IJ SOAJ injection, Inject 0.3 mLs (0.3 mg total) into the muscle once as needed (for severe allergic reaction). CAll 911 immediately if you have to use this medicine (Patient not taking: Reported on 01/15/2020), Disp: 1 Device, Rfl: 1 .  HYDROcodone-acetaminophen (NORCO/VICODIN) 5-325 MG tablet,  Take 1 tablet by mouth every 6 (six) hours as needed for moderate pain. (Patient not taking: Reported on 08/20/2019), Disp: 20 tablet, Rfl: 0   Allergies  Allergen Reactions  . Aspirin Other (See Comments)    insomnia  . Hydroxyzine Other (See Comments)    insomnia  . Valium [Diazepam] Other (See Comments)    Insomnia, 05/10/16 pt states she took as young person- made her depressed     Review of Systems  Constitutional: Negative.  Negative for  fatigue.  HENT:       Lower front tooth is loose  Respiratory: Negative.  Negative for cough.   Cardiovascular: Negative.  Negative for chest pain.  Gastrointestinal: Negative.   Endocrine: Negative.  Negative for polydipsia, polyphagia and polyuria.  Skin: Negative.   Neurological: Negative.  Negative for dizziness.  Psychiatric/Behavioral: Negative for confusion. The patient is not nervous/anxious.      Today's Vitals   01/15/20 1045  BP: 126/84  Pulse: 72  Temp: 97.8 F (36.6 C)  TempSrc: Oral  Weight: 185 lb 6.4 oz (84.1 kg)  Height: _0  (1.6 m)   Body mass index is 32.84 kg/m.   Objective:  Physical Exam Constitutional:      General: She is not in acute distress.    Appearance: Normal appearance.  HENT:     Mouth/Throat:     Comments: Left front lower tooth is loose Cardiovascular:     Rate and Rhythm: Normal rate and regular rhythm.     Pulses: Normal pulses.     Heart sounds: Normal heart sounds. No murmur.  Pulmonary:     Effort: Pulmonary effort is normal.     Breath sounds: Normal breath sounds.  Skin:    Capillary Refill: Capillary refill takes less than 2 seconds.  Neurological:     General: No focal deficit present.     Mental Status: She is alert and oriented to person, place, and time.  Psychiatric:        Mood and Affect: Mood normal.        Behavior: Behavior normal.        Thought Content: Thought content normal.        Judgment: Judgment normal.         Assessment And Plan:     1. Type 2 diabetes mellitus without complication, without long-term current use of insulin (HCC)  Chronic, stable with metformin  Will check HgbA1c   Due to her history of diabetes will treat with prophylactic antibiotic for upcoming dental procedure - amoxicillin (AMOXIL) 500 MG capsule; Take 4 caps prior to dental procedure  Dispense: 4 capsule; Refill: 0 - CMP14+EGFR - Lipid panel - Hemoglobin A1c  2. Essential hypertension  Chronic,    Stable  Continue with current medications - amoxicillin (AMOXIL) 500 MG capsule; Take 4 caps prior to dental procedure  Dispense: 4 capsule; Refill: 0  3. Loosening of tooth  Pre-antibiotic treatment given for upcoming dental procedure to lower front tooth due to her history of diabetes, no known cardiac disease but has reported a history of intermittent chest pain seen in ER for this in the past   [name], FNP    THE PATIENT IS ENCOURAGED TO PRACTICE SOCIAL DISTANCING DUE TO THE COVID-19 PANDEMIC.

## 2020-01-15 NOTE — Progress Notes (Signed)
This visit occurred during the SARS-CoV-2 public health emergency.  Safety protocols were in place, including screening questions prior to the visit, additional usage of staff PPE, and extensive cleaning of exam room while observing appropriate contact time as indicated for disinfecting solutions.  Subjective:   Allison Franco is a 79 y.o. female who presents for Medicare Annual (Subsequent) preventive examination.  Review of Systems:  n/a Cardiac Risk Factors include: advanced age (>72mn, >>101women);diabetes mellitus;hypertension;sedentary lifestyle;obesity (BMI >30kg/m2)     Objective:     Vitals: BP 126/84 (BP Location: Left Arm, Patient Position: Sitting, Cuff Size: Normal)   Pulse 72   Temp 97.8 F (36.6 C) (Oral)   Ht _0  (1.6 m)   Wt 185 lb (83.9 kg)   BMI 32.77 kg/m   Body mass index is 32.77 kg/m.  Advanced Directives 01/15/2020 10/13/2019 08/07/2019 08/07/2019 08/06/2019 01/04/2019 11/03/2018  Does Patient Have a Medical Advance Directive? Yes Yes No No No No Yes  Type of [place]aramed[place]of [place] will [place] Does patient want to make changes to medical advance directive? - - - - - - -  Copy of HT[place]in Chart? Yes - validated most recent copy scanned in chart (See row information) - - - - - -  Would patient like information on creating a medical advance directive? - - No - Patient declined No - Patient declined - No - Patient declined -  Pre-existing out of facility DNR order (yellow form or pink MOST form) - - - - - - -    Tobacco Social History   Tobacco Use  Smoking Status Never Smoker  Smokeless Tobacco Never Used     Counseling given: Not Answered   Clinical Intake:  Pre-visit preparation completed: Yes  Pain : No/denies pain     Nutritional Status: BMI > 30  Obese Nutritional Risks: None Diabetes: Yes  How often do you need to have someone help you when you read  instructions, pamphlets, or other written materials from your doctor or pharmacy?: 1 - Never What is the last grade level you completed in school?: college  Interpreter Needed?: No  Information entered by :: NAllen LPN  Past Medical History:  Diagnosis Date  . Anxiety   . Constipation   . Depression    "following husband's death"  . Diabetes mellitus without complication ([place]   . Hypertension   . KNEE PAIN, BILATERAL 02/13/2009   Qualifier: Diagnosis of  By: MHassell DoneFNP, NTori Milks   . Tinnitus    Past Surgical History:  Procedure Laterality Date  . ABDOMINAL HYSTERECTOMY    . COLON RESECTION N/A 08/07/2019   Procedure: DIAGNOSTIC LAPAROSCOPY WITH OPEN LEFT INGUINAL HERNIA WITH MESH;  Surgeon: MJohnathan Hausen MD;  Location: [facility];  Service: General;  Laterality: N/A;  . fribroid surgery     Family History  Problem Relation Age of Onset  . Diabetes Mother   . Hypertension Father    Social History   Socioeconomic History  . Marital status: Widowed    Spouse name: Not on file  . Number of children: 0  . Years of education: 140 . Highest education level: Not on file  Occupational History  . Occupation: employed  Tobacco Use  . Smoking status: Never Smoker  . Smokeless tobacco: Never Used  Substance and Sexual Activity  . Alcohol use: No  . Drug use: No  . Sexual  activity: Not Currently  Other Topics Concern  . Not on file  Social History Narrative   Lives alone, husband passed away 2 1/2 years ago   Caffeine use- none   Social Determinants of Health   Financial Resource Strain: Low Risk   . Difficulty of Paying Living Expenses: Not hard at all  Food Insecurity: No Food Insecurity  . Worried About Charity fundraiser in the Last Year: Never true  . Ran Out of Food in the Last Year: Never true  Transportation Needs: No Transportation Needs  . Lack of Transportation (Medical): No  . Lack of Transportation (Non-Medical): No  Physical Activity: Inactive  . Days  of Exercise per Week: 0 days  . Minutes of Exercise per Session: 0 min  Stress: No Stress Concern Present  . Feeling of Stress : Not at all  Social Connections:   . Frequency of Communication with Friends and Family: Not on file  . Frequency of Social Gatherings with Friends and Family: Not on file  . Attends Religious Services: Not on file  . Active Member of Clubs or Organizations: Not on file  . Attends Archivist Meetings: Not on file  . Marital Status: Not on file    Outpatient Encounter Medications as of 01/15/2020  Medication Sig  . acetaminophen (TYLENOL) 500 MG tablet Take 1 tablet (500 mg total) by mouth every 6 (six) hours as needed for mild pain, moderate pain or headache. Reported on 05/10/2016  . Alcohol Swabs (ALCOHOL WIPES) 70 % PADS by Does not apply route. Use with blood sugar check and injection of insulin  . blood glucose meter kit and supplies KIT Dispense based on patient and insurance preference. Use up to four times daily as directed. (FOR ICD-9 250.00, 250.01).  [name] EPINEPHrine (EPIPEN 2-PAK) 0.3 mg/0.3 mL IJ SOAJ injection Inject 0.3 mLs (0.3 mg total) into the muscle once as needed (for severe allergic reaction). CAll 911 immediately if you have to use this medicine (Patient not taking: Reported on 01/15/2020)  . HYDROcodone-acetaminophen (NORCO/VICODIN) 5-325 MG tablet Take 1 tablet by mouth every 6 (six) hours as needed for moderate pain. (Patient not taking: Reported on 08/20/2019)  . metFORMIN (GLUCOPHAGE) 500 MG tablet TAKE 1 TABLET BY MOUTH 2 TIMES EVERY DAY WITH MORNING AND EVENING MEALS (Patient taking differently: Take 500 mg by mouth 2 (two) times daily with a meal. Give w/food.)  . Multiple Vitamin (MULTIVITAMIN WITH MINERALS) TABS tablet Take 1 tablet by mouth daily. Centrum  . olmesartan (BENICAR) 20 MG tablet TAKE 1 TABLET BY MOUTH ONCE DAILY  . pneumococcal 13-valent conjugate vaccine (PREVNAR 13) SUSP injection Inject 0.5 mLs into the muscle  tomorrow at 10 am for 1 dose.  . Tdap (BOOSTRIX) 5-2.5-18.5 LF-MCG/0.5 injection Inject 0.5 mLs into the muscle once for 1 dose.   No facility-administered encounter medications on file as of 01/15/2020.    Activities of Daily Living In your present state of health, do you have any difficulty performing the following activities: 01/15/2020 08/07/2019  Hearing? N N  Vision? N N  Difficulty concentrating or making decisions? N N  Walking or climbing stairs? N N  Dressing or bathing? N N  Doing errands, shopping? N Y  Conservation officer, nature and eating ? N -  Using the Toilet? N -  In the past six months, have you accidently leaked urine? N -  Do you have problems with loss of bowel control? N -  Managing your Medications? N -  Managing your Finances? N -  Housekeeping or managing your Housekeeping? N -  Some recent data might be hidden    Patient Care Team: [name], FNP as PCP - General ([place]) Daneen Schick as Social Worker [place], Royce Macadamia, [facility] (Pharmacist)    Assessment:   This is a routine wellness examination for [place].  Exercise Activities and Dietary recommendations Current Exercise Habits: The patient does not participate in regular exercise at present  Goals    . Assist with Disease Management and Care Coordination     Current Barriers:  [name] Knowledge Barriers related to resources and support available to address needs related to chronic disease management and community resources.  Case Manager Clinical Goal(s):  [name] Over the next 30 days, patient will work with the CCM team to address needs related to [employer], Chronic Disease Self Health Management, and improved medication management.   Interventions:  . Collaborated with BSW and initiated plan of care to address needs related to community resources and referral to embedded Pharm D.   Patient Self Care Activities:  . Self administers medications as prescribed . Performs ADL's independently . Calls  provider office for new concerns or questions  Initial goal documentation     . Exercise 150 min/wk Moderate Activity (pt-stated)    . I would like to continue managing my diabetes and blood pressure (pt-stated)     Current Barriers:  . Language Barriers . Financial Barriers  Pharmacist Clinical Goal(s):  [name] Over the next 90 days, patient will work with PharmD and PCP  to address needs related to optimized medication management of chronic conditions  Interventions: Call completed with patient on 10/15/19 . Comprehensive medication review performed.  No changes have been made to medications.  Patient denies adverse events. . Advised patient to continue taking medications as prescribed and checking BGs every morning as directed by PCP. [name] Patient set up for continued pharmacy delivery through [facility]) . Patient continues to check BG most every morning.  It ranges from 80-100 depending on her diet. Last A1c was 6.6% on 08/06/2019.  [name] She states her blood pressure has also been at goal <130/80.  She denies BP issues.   . She states she needs a new hearing aid and that her hearing is th main problem.  Unfortunately, there are no resources to assist with this.  Patient has been complaining of tinnitus chronically.  Patient's current medications reviewed for tinnitus risk.  Angiotensin receptor blockers (ARBs-pt on olmesartan) --low tinnitus risk.  All of patient's other medications are unlikely to cause tinnitus as well. . Discussed plans with patient for ongoing care management follow up and provided patient with direct contact information for care management team . Discussed patient case with CCM team who will be reaching out to patient in the next month  Patient Self Care Activities:  . Self administers medications as prescribed . Attends all scheduled provider appointments . Calls pharmacy for medication refills  Please see past updates related to this goal by clicking  on the "Past Updates" button in the selected goal      . Patient Stated     01/15/2020, wants to increase water intake, exercise and eat healthy       Fall Risk Fall Risk  01/15/2020 05/09/2019 01/10/2019 10/04/2018 10/04/2018  Falls in the past year? 0 0 0 0 0  Comment - - - - -  Risk for fall due to : Medication side effect - -  Medication side effect -  Follow up Falls evaluation completed;Education provided;Falls prevention discussed - - - -   Is the patient's home free of loose throw rugs in walkways, pet beds, electrical cords, etc?   yes      Grab bars in the bathroom? yes      Handrails on the stairs?   n/a      Adequate lighting?   yes  Timed Get Up and Go performed: n/a  Depression Screen PHQ 2/9 Scores 01/15/2020 05/09/2019 04/11/2019 01/10/2019  PHQ - 2 Score 0 0 0 0  PHQ- 9 Score 3 - - -     Cognitive Function     6CIT Screen 01/15/2020 10/04/2018  What Year? 0 points 0 points  What month? 0 points 0 points  What time? 0 points 0 points  Count back from 20 0 points 0 points  Months in reverse 2 points 0 points  Repeat phrase 6 points 0 points  Total Score 8 0    Immunization History  Administered Date(s) Administered  . Influenza Whole 11/18/2009  . Influenza-Unspecified 09/24/2018  . Pneumococcal Polysaccharide-23 02/01/2007  . Td 02/01/2007    Qualifies for Shingles Vaccine? yes  Screening Tests Health Maintenance  Topic Date Due  . FOOT EXAM  10/20/1951  . OPHTHALMOLOGY EXAM  10/20/1951  . DEXA SCAN  10/19/2006  . PNA vac Low Risk Adult (2 of 2 - PCV13) 02/01/2008  . TETANUS/TDAP  01/31/2017  . HEMOGLOBIN A1C  02/03/2020  . INFLUENZA VACCINE  Completed    Cancer Screenings: Lung: Low Dose CT Chest recommended if Age 78-80 years, 30 pack-year currently smoking OR have quit w/in 15years. Patient does not qualify. Breast:  Up to date on Mammogram? No   Up to date of Bone Density/Dexa? No Colorectal: not required  Additional Screenings:  : Hepatitis C Screening: n/a     Plan:    Patient wants to increase water intake, exercise and eat healthy   I have personally reviewed and noted the following in the patient's chart:   . Medical and social history . Use of alcohol, tobacco or illicit drugs  . Current medications and supplements . Functional ability and status . Nutritional status . Physical activity . Advanced directives . List of other physicians . Hospitalizations, surgeries, and ER visits in previous 12 months . Vitals . Screenings to include cognitive, depression, and falls . Referrals and appointments  In addition, I have reviewed and discussed with patient certain preventive protocols, quality metrics, and best practice recommendations. A written personalized care plan for preventive services as well as general preventive health recommendations were provided to patient.     Kellie Simmering, LPN  1/[id number]/[id number]

## 2020-01-15 NOTE — Patient Instructions (Signed)
Allison Franco , Thank you for taking time to come for your Medicare Wellness Visit. I appreciate your ongoing commitment to your health goals. Please review the following plan we discussed and let me know if I can assist you in the future.   Screening recommendations/referrals: Colonoscopy: not required Mammogram: not required Bone Density: ordered 04/2019 Recommended yearly ophthalmology/optometry visit for glaucoma screening and checkup Recommended yearly dental visit for hygiene and checkup  Vaccinations: Influenza vaccine: 06/2019 Pneumococcal vaccine: sent to pharmacy Tdap vaccine: sent to pharmacy Shingles vaccine: discussed    Advanced directives: copy in chart  Conditions/risks identified: obesity  Next appointment:    Preventive Care 39 Years and Older, Female Preventive care refers to lifestyle choices and visits with your health care provider that can promote health and wellness. What does preventive care include?  A yearly physical exam. This is also called an annual well check.  Dental exams once or twice a year.  Routine eye exams. Ask your health care provider how often you should have your eyes checked.  Personal lifestyle choices, including:  Daily care of your teeth and gums.  Regular physical activity.  Eating a healthy diet.  Avoiding tobacco and drug use.  Limiting alcohol use.  Practicing safe sex.  Taking low-dose aspirin every day.  Taking vitamin and mineral supplements as recommended by your health care provider. What happens during an annual well check? The services and screenings done by your health care provider during your annual well check will depend on your age, overall health, lifestyle risk factors, and family history of disease. Counseling  Your health care provider may ask you questions about your:  Alcohol use.  Tobacco use.  Drug use.  Emotional well-being.  Home and relationship well-being.  Sexual activity.  Eating  habits.  History of falls.  Memory and ability to understand (cognition).  Work and work Statistician.  Reproductive health. Screening  You may have the following tests or measurements:  Height, weight, and BMI.  Blood pressure.  Lipid and cholesterol levels. These may be checked every 5 years, or more frequently if you are over 44 years old.  Skin check.  Lung cancer screening. You may have this screening every year starting at age 11 if you have a 30-pack-year history of smoking and currently smoke or have quit within the past 15 years.  Fecal occult blood test (FOBT) of the stool. You may have this test every year starting at age 47.  Flexible sigmoidoscopy or colonoscopy. You may have a sigmoidoscopy every 5 years or a colonoscopy every 10 years starting at age 49.  Hepatitis C blood test.  Hepatitis B blood test.  Sexually transmitted disease (STD) testing.  Diabetes screening. This is done by checking your blood sugar (glucose) after you have not eaten for a while (fasting). You may have this done every 1-3 years.  Bone density scan. This is done to screen for osteoporosis. You may have this done starting at age 78.  Mammogram. This may be done every 1-2 years. Talk to your health care provider about how often you should have regular mammograms. Talk with your health care provider about your test results, treatment options, and if necessary, the need for more tests. Vaccines  Your health care provider may recommend certain vaccines, such as:  Influenza vaccine. This is recommended every year.  Tetanus, diphtheria, and acellular pertussis (Tdap, Td) vaccine. You may need a Td booster every 10 years.  Zoster vaccine. You may need this after  age 44.  Pneumococcal 13-valent conjugate (PCV13) vaccine. One dose is recommended after age 55.  Pneumococcal polysaccharide (PPSV23) vaccine. One dose is recommended after age 82. Talk to your health care provider about which  screenings and vaccines you need and how often you need them. This information is not intended to replace advice given to you by your health care provider. Make sure you discuss any questions you have with your health care provider. Document Released: 12/11/2015 Document Revised: 08/03/2016 Document Reviewed: 09/15/2015 Elsevier Interactive Patient Education  2017 [place] Prevention in the Home Falls can cause injuries. They can happen to people of all ages. There are many things you can do to make your home safe and to help prevent falls. What can I do on the outside of my home?  Regularly fix the edges of walkways and driveways and fix any cracks.  Remove anything that might make you trip as you walk through a door, such as a raised step or threshold.  Trim any bushes or trees on the path to your home.  Use bright outdoor lighting.  Clear any walking paths of anything that might make someone trip, such as rocks or tools.  Regularly check to see if handrails are loose or broken. Make sure that both sides of any steps have handrails.  Any raised decks and porches should have guardrails on the edges.  Have any leaves, snow, or ice cleared regularly.  Use sand or salt on walking paths during winter.  Clean up any spills in your garage right away. This includes oil or grease spills. What can I do in the bathroom?  Use night lights.  Install grab bars by the toilet and in the tub and shower. Do not use towel bars as grab bars.  Use non-skid mats or decals in the tub or shower.  If you need to sit down in the shower, use a plastic, non-slip stool.  Keep the floor dry. Clean up any water that spills on the floor as soon as it happens.  Remove soap buildup in the tub or shower regularly.  Attach bath mats securely with double-sided non-slip rug tape.  Do not have throw rugs and other things on the floor that can make you trip. What can I do in the bedroom?  Use  night lights.  Make sure that you have a light by your bed that is easy to reach.  Do not use any sheets or blankets that are too big for your bed. They should not hang down onto the floor.  Have a firm chair that has side arms. You can use this for support while you get dressed.  Do not have throw rugs and other things on the floor that can make you trip. What can I do in the kitchen?  Clean up any spills right away.  Avoid walking on wet floors.  Keep items that you use a lot in easy-to-reach places.  If you need to reach something above you, use a strong step stool that has a grab bar.  Keep electrical cords out of the way.  Do not use floor polish or wax that makes floors slippery. If you must use wax, use non-skid floor wax.  Do not have throw rugs and other things on the floor that can make you trip. What can I do with my stairs?  Do not leave any items on the stairs.  Make sure that there are handrails on both sides of the stairs  and use them. Fix handrails that are broken or loose. Make sure that handrails are as long as the stairways.  Check any carpeting to make sure that it is firmly attached to the stairs. Fix any carpet that is loose or worn.  Avoid having throw rugs at the top or bottom of the stairs. If you do have throw rugs, attach them to the floor with carpet tape.  Make sure that you have a light switch at the top of the stairs and the bottom of the stairs. If you do not have them, ask someone to add them for you. What else can I do to help prevent falls?  Wear shoes that:  Do not have high heels.  Have rubber bottoms.  Are comfortable and fit you well.  Are closed at the toe. Do not wear sandals.  If you use a stepladder:  Make sure that it is fully opened. Do not climb a closed stepladder.  Make sure that both sides of the stepladder are locked into place.  Ask someone to hold it for you, if possible.  Clearly mark and make sure that you  can see:  Any grab bars or handrails.  First and last steps.  Where the edge of each step is.  Use tools that help you move around (mobility aids) if they are needed. These include:  Canes.  Walkers.  Scooters.  Crutches.  Turn on the lights when you go into a dark area. Replace any light bulbs as soon as they burn out.  Set up your furniture so you have a clear path. Avoid moving your furniture around.  If any of your floors are uneven, fix them.  If there are any pets around you, be aware of where they are.  Review your medicines with your doctor. Some medicines can make you feel dizzy. This can increase your chance of falling. Ask your doctor what other things that you can do to help prevent falls. This information is not intended to replace advice given to you by your health care provider. Make sure you discuss any questions you have with your health care provider. Document Released: 09/10/2009 Document Revised: 04/21/2016 Document Reviewed: 12/19/2014 Elsevier Interactive Patient Education  2017 [employer].

## 2020-01-16 ENCOUNTER — Telehealth: Payer: Self-pay

## 2020-01-16 LAB — CMP14+EGFR
ALT: 19 IU/L (ref 0–32)
AST: 16 IU/L (ref 0–40)
Albumin/Globulin Ratio: 1.3 (ref 1.2–2.2)
Albumin: 4 g/dL (ref 3.7–4.7)
Alkaline Phosphatase: 111 IU/L (ref 39–117)
BUN/Creatinine Ratio: 18 (ref 12–28)
BUN: 16 mg/dL (ref 8–27)
Bilirubin Total: 0.2 mg/dL (ref 0.0–1.2)
CO2: 26 mmol/L (ref 20–29)
Calcium: 8.9 mg/dL (ref 8.7–10.3)
Chloride: 98 mmol/L (ref 96–106)
Creatinine, Ser: 0.87 mg/dL (ref 0.57–1.00)
GFR calc Af Amer: 74 mL/min/{1.73_m2} (ref >59)
GFR calc non Af Amer: 64 mL/min/{1.73_m2} (ref >59)
Globulin, Total: 3 g/dL (ref 1.5–4.5)
Glucose: 78 mg/dL (ref 65–99)
Potassium: 4.7 mmol/L (ref 3.5–5.2)
Sodium: 135 mmol/L (ref 134–144)
Total Protein: 7 g/dL (ref 6.0–8.5)

## 2020-01-16 LAB — HEMOGLOBIN A1C
Est. average glucose Bld gHb Est-mCnc: 137 mg/dL
Hgb A1c MFr Bld: 6.4 % — ABNORMAL HIGH (ref 4.8–5.6)

## 2020-01-16 LAB — LIPID PANEL
Chol/HDL Ratio: 4 ratio (ref 0.0–4.4)
Cholesterol, Total: 170 mg/dL (ref 100–199)
HDL: 43 mg/dL (ref >39)
LDL Chol Calc (NIH): 99 mg/dL (ref 0–99)
Triglycerides: 163 mg/dL — ABNORMAL HIGH (ref 0–149)
VLDL Cholesterol Cal: 28 mg/dL (ref 5–40)

## 2020-01-16 NOTE — Telephone Encounter (Signed)
-----   Message from [name], [place] sent at 01/16/2020 10:57 AM EST ----- Kidney and liver functions are normal. Your triglycerides are 163 goal is less than 149 limit intake of sugary food and carbohydrates. HgbA1c is down to 6.4 from 6.6, continue with your medications.  Increase your physical activity as well.

## 2020-01-16 NOTE — Telephone Encounter (Signed)
LAB RESULTS GIVEN

## 2020-01-19 ENCOUNTER — Encounter: Payer: Self-pay | Admitting: Nurse Practitioner

## 2020-02-04 ENCOUNTER — Other Ambulatory Visit: Payer: Self-pay | Admitting: Nurse Practitioner

## 2020-02-11 ENCOUNTER — Other Ambulatory Visit: Payer: Self-pay | Admitting: Nurse Practitioner

## 2020-02-12 ENCOUNTER — Other Ambulatory Visit: Payer: Self-pay

## 2020-02-12 MED ORDER — METFORMIN HCL 500 MG PO TABS: ORAL_TABLET | ORAL | 1 refills | Status: DC

## 2020-02-12 MED ORDER — OLMESARTAN MEDOXOMIL 20 MG PO TABS: 20.0000 mg | ORAL_TABLET | Freq: Every day | ORAL | 1 refills | Status: DC

## 2020-02-14 ENCOUNTER — Ambulatory Visit: Payer: Medicare Other | Attending: Internal Medicine

## 2020-02-14 DIAGNOSIS — Z23 Encounter for immunization: Secondary | ICD-10-CM

## 2020-02-14 NOTE — Progress Notes (Signed)
   Covid-19 Vaccination Clinic  Name:  Allison Franco    MRN: [id number] DOB: [birth year]  02/14/2020  [name] was observed post Covid-19 immunization for 15 minutes without incident. She was provided with Vaccine Information Sheet and instruction to access the V-Safe system.   [name] was instructed to call 911 with any severe reactions post vaccine: [name] Difficulty breathing  . Swelling of face and throat  . A fast heartbeat  . A bad rash all over body  . Dizziness and weakness   Immunizations Administered    Name Date Dose VIS Date Route   Pfizer COVID-19 Vaccine 02/14/2020  9:48 AM 0.3 mL 11/08/2019 Intramuscular   Manufacturer: [place]   Lot: [id number]   [place]: [id number]

## 2020-02-17 ENCOUNTER — Telehealth: Payer: Self-pay

## 2020-03-10 ENCOUNTER — Ambulatory Visit: Payer: Medicare Other | Attending: Internal Medicine

## 2020-03-10 DIAGNOSIS — Z23 Encounter for immunization: Secondary | ICD-10-CM

## 2020-03-10 NOTE — Progress Notes (Signed)
   Covid-19 Vaccination Clinic  Name:  Allison Franco    MRN: [id number] DOB: 1941-01-22  03/10/2020  Ms. [name] was observed post Covid-19 immunization for 15 minutes without incident. She was provided with Vaccine Information Sheet and instruction to access the V-Safe system.   Ms. [name] was instructed to call 911 with any severe reactions post vaccine: [name] Difficulty breathing  . Swelling of face and throat  . A fast heartbeat  . A bad rash all over body  . Dizziness and weakness   Immunizations Administered    Name Date Dose VIS Date Route   Pfizer COVID-19 Vaccine 03/10/2020 10:32 AM 0.3 mL 11/08/2019 Intramuscular   Manufacturer: [place]   Lot: [id number]   [place]: [id number]

## 2020-03-24 ENCOUNTER — Telehealth: Payer: Self-pay | Admitting: Nurse Practitioner

## 2020-03-24 NOTE — Chronic Care Management (AMB) (Signed)
  Chronic Care Management   Outreach Note  03/24/2020 Name: Kiele Sinden MRN: [id number] DOB: [birth year]  Sheala Goding is a 79 y.o. year old female who is a primary care patient of [name], [place]. I reached out to [name] by phone today in response to a referral sent by Ms. Camila Li health plan.     An unsuccessful telephone outreach was attempted today. The patient was referred to the case management team for assistance with care management and care coordination.   Follow Up Plan: The care management team will reach out to the patient again over the next 7 days. If patient returns call to provider office, please advise to call [place] at [phone].  [place], [place] [address] Direct Dial: [phone] [email or web address]: [email or web address]

## 2020-03-26 ENCOUNTER — Telehealth: Payer: Self-pay

## 2020-03-26 NOTE — Telephone Encounter (Signed)
Jeanett Schlein RN with remote [facility] called to schedule an appt. Pt told her that she has not seen [place] in a year and shes been having some ringing in her ear. Nurse wasn't sure if she understood pt correctly due to the language barrier. I advised her that pt had an appt with [place] in feb 2021 and she is coming back in June for a 4 month f/u. Tyler Deis

## 2020-03-27 NOTE — Chronic Care Management (AMB) (Signed)
  Care Management   Note  03/27/2020 Name: Allison Franco MRN: [id number] DOB: 1941/10/12  Allison Franco is a 79 y.o. year old female who is a primary care patient of [name], [place] and is actively engaged with the care management team. I reached out to [name] by phone today to assist with scheduling an initial visit with the Pharmacist  Follow up plan: Face to Face appointment with care management team member scheduled for: 04/09/2020  [place], [place] [facility]  [place], [place] [address] Direct Dial: [email or web address]

## 2020-04-05 ENCOUNTER — Emergency Department (HOSPITAL_COMMUNITY): Payer: Medicare Other

## 2020-04-05 ENCOUNTER — Other Ambulatory Visit: Payer: Self-pay

## 2020-04-05 ENCOUNTER — Emergency Department (HOSPITAL_COMMUNITY)
Admission: EM | Admit: 2020-04-05 | Discharge: 2020-04-05 | Disposition: A | Discharge status: Home / Self Care | Payer: Medicare Other | Attending: Emergency Medicine | Admitting: Emergency Medicine

## 2020-04-05 DIAGNOSIS — H9311 Tinnitus, right ear: Secondary | ICD-10-CM | POA: Diagnosis not present

## 2020-04-05 DIAGNOSIS — R0602 Shortness of breath: Secondary | ICD-10-CM | POA: Diagnosis not present

## 2020-04-05 DIAGNOSIS — Z7984 Long term (current) use of oral hypoglycemic drugs: Secondary | ICD-10-CM | POA: Insufficient documentation

## 2020-04-05 DIAGNOSIS — I10 Essential (primary) hypertension: Secondary | ICD-10-CM | POA: Insufficient documentation

## 2020-04-05 DIAGNOSIS — E119 Type 2 diabetes mellitus without complications: Secondary | ICD-10-CM | POA: Diagnosis not present

## 2020-04-05 LAB — COMPREHENSIVE METABOLIC PANEL
ALT: 20 U/L (ref 0–44)
AST: 22 U/L (ref 15–41)
Albumin: 3.4 g/dL — ABNORMAL LOW (ref 3.5–5.0)
Alkaline Phosphatase: 81 U/L (ref 38–126)
Anion gap: 10 (ref 5–15)
BUN: 13 mg/dL (ref 8–23)
CO2: 26 mmol/L (ref 22–32)
Calcium: 8.7 mg/dL — ABNORMAL LOW (ref 8.9–10.3)
Chloride: 101 mmol/L (ref 98–111)
Creatinine, Ser: 0.76 mg/dL (ref 0.44–1.00)
GFR calc Af Amer: >60 mL/min (ref >60)
GFR calc non Af Amer: >60 mL/min (ref >60)
Glucose, Bld: 138 mg/dL — ABNORMAL HIGH (ref 70–99)
Potassium: 4.6 mmol/L (ref 3.5–5.1)
Sodium: 137 mmol/L (ref 135–145)
Total Bilirubin: 0.7 mg/dL (ref 0.3–1.2)
Total Protein: 6.5 g/dL (ref 6.5–8.1)

## 2020-04-05 LAB — TROPONIN I (HIGH SENSITIVITY)
Troponin I (High Sensitivity): 10 ng/L (ref <18)
Troponin I (High Sensitivity): 10 ng/L (ref <18)

## 2020-04-05 LAB — CBC WITH DIFFERENTIAL/PLATELET
Abs Immature Granulocytes: 0.01 10*3/uL (ref 0.00–0.07)
Basophils Absolute: 0 10*3/uL (ref 0.0–0.1)
Basophils Relative: 1 %
Eosinophils Absolute: 0.2 10*3/uL (ref 0.0–0.5)
Eosinophils Relative: 3 %
HCT: 41.2 % (ref 36.0–46.0)
Hemoglobin: 12.7 g/dL (ref 12.0–15.0)
Immature Granulocytes: 0 %
Lymphocytes Relative: 20 %
Lymphs Abs: 1.2 10*3/uL (ref 0.7–4.0)
MCH: 27.9 pg (ref 26.0–34.0)
MCHC: 30.8 g/dL (ref 30.0–36.0)
MCV: 90.5 fL (ref 80.0–100.0)
Monocytes Absolute: 0.5 10*3/uL (ref 0.1–1.0)
Monocytes Relative: 9 %
Neutro Abs: 4.1 10*3/uL (ref 1.7–7.7)
Neutrophils Relative %: 67 %
Platelets: 248 10*3/uL (ref 150–400)
RBC: 4.55 MIL/uL (ref 3.87–5.11)
RDW: 12.2 % (ref 11.5–15.5)
WBC: 6 10*3/uL (ref 4.0–10.5)
nRBC: 0 % (ref 0.0–0.2)

## 2020-04-05 LAB — BRAIN NATRIURETIC PEPTIDE: B Natriuretic Peptide: 37.8 pg/mL (ref 0.0–100.0)

## 2020-04-05 NOTE — ED Triage Notes (Addendum)
Pt. Presented [facility] c/o SOB and tinnitus. Pt stated symptoms started at 10am and that tinnitus is always present before b/p elevates. PMH: hypertension

## 2020-04-05 NOTE — ED Provider Notes (Signed)
[facility] EMERGENCY DEPARTMENT Provider Note   CSN: [id number] Arrival date & time: 04/05/20  0554     History Chief Complaint  Patient presents with  . Shortness of Breath  . Tinnitus    Allison Franco is a 79 y.o. female with PMHx HTN, diabetes, anxiety, tinnitus who presents to the ED today complaining of gradual onset, constant, worsening, tinnitus to R ear that began around 10 AM yesterday morning. Pt states that she has had tinnitus intermittently for at least 3 years. She states that she was unable to sleep last night due to the ringing prompting her to come to the ED for further evaluation. Pt states when the tinnitus becomes severe she will start feeling short of breath and her blood pressure will rise. Pt states that she took an extra BP med at 1 AM this morning because she knew her BP was high. When she called EMS they checked her BP again and it was noted to be 208/100. On arrival to the ED BP 151/83. She states she is currently taking "medication that starts with A" however does not know the dosage. Per chart review it appears during last PCP visit in Feb 2021 she was listed to be on Olmesartan. Pt states she is here to get help. She does mention seeing Dr. Valetta Mole with ENT for hearing aids however "no one is helping her." Pt states she is tired of coming to the ED. Her shortness of breath has resolved however. She denies any chest pain with the SOB. She denies fevers, chills, headache, neck stiffness, rash, vision changes, unilateral weakness or numbness, confusion, speech changes, or any other associated symptoms.   The history is provided by the patient and medical records.    Per chart review: Pt previously seen in the ED on 10/13/2019 for similar complaint of tinnitus with noted elevated BP. Pt also had some chest pressure, sweating of her feet, and shortness of breath that lasted 5 minutes prior to dissipating. Pt had a cardiac workup at that time which was  uneventful and discharged home. It was noted that pt was being worked up by Dr. Valetta Mole with ENT regarding the tinnitus and it was recommended that she be evaluated for hearing aids as there was concern the tinnitus was masking other symptoms. Pt reported not being able to afford hearing aids and was never evaluated for such. Social work was consulted however pt was advised she would need to have an audiologist eval and Rx from PCP for hearing aids.     Past Medical History:  Diagnosis Date  . Anxiety   . Constipation   . Depression    "following husband's death"  . Diabetes mellitus without complication ([place])   . Hypertension   . KNEE PAIN, BILATERAL 02/13/2009   Qualifier: Diagnosis of  By: Hassell Done FNP, Tori Milks    . Tinnitus     Patient Active Problem List   Diagnosis Date Noted  . Inguinal hernia with bowel obstruction 08/07/2019  . Incarcerated left inguinal hernia 08/07/2019  . Anemia 08/31/2018  . Essential hypertension 07/03/2018  . Type 2 diabetes mellitus without complication, without long-term current use of insulin ([place]) 07/02/2018  . Grief reaction 10/20/2013  . Obesity 10/20/2013  . ALLERGIC RHINITIS 08/11/2009  . INSOMNIA 07/17/2008  . TIREDNESS 04/16/2008  . HYPERTENSION 01/24/2008    Past Surgical History:  Procedure Laterality Date  . ABDOMINAL HYSTERECTOMY    . COLON RESECTION N/A 08/07/2019   Procedure: DIAGNOSTIC LAPAROSCOPY WITH  OPEN LEFT INGUINAL HERNIA WITH MESH;  Surgeon: Johnathan Hausen, MD;  Location: [facility];  Service: General;  Laterality: N/A;  . fribroid surgery       OB History   No obstetric history on file.     Family History  Problem Relation Age of Onset  . Diabetes Mother   . Hypertension Father     Social History   Tobacco Use  . Smoking status: Never Smoker  . Smokeless tobacco: Never Used  Substance Use Topics  . Alcohol use: No  . Drug use: No    Home Medications Prior to Admission medications   Medication Sig Start  Date End Date Taking? Authorizing Provider  acetaminophen (TYLENOL) 500 MG tablet Take 1 tablet (500 mg total) by mouth every 6 (six) hours as needed for mild pain, moderate pain or headache. Reported on 05/10/2016 08/09/19   Kalman Drape, PA  Alcohol Swabs (ALCOHOL WIPES) 70 % PADS by Does not apply route. Use with blood sugar check and injection of insulin    [provider]  amoxicillin (AMOXIL) 500 MG capsule Take 4 caps prior to dental procedure 01/15/20   [name], FNP  blood glucose meter kit and supplies KIT Dispense based on patient and insurance preference. Use up to four times daily as directed. (FOR ICD-9 250.00, 250.01). 07/05/18   Florencia Reasons, MD  EPINEPHrine (EPIPEN 2-PAK) 0.3 mg/0.3 mL IJ SOAJ injection Inject 0.3 mLs (0.3 mg total) into the muscle once as needed (for severe allergic reaction). CAll 911 immediately if you have to use this medicine Patient not taking: Reported on 01/15/2020 09/10/17   Larene Pickett, PA-C  HYDROcodone-acetaminophen (NORCO/VICODIN) 5-325 MG tablet Take 1 tablet by mouth every 6 (six) hours as needed for moderate pain. Patient not taking: Reported on 08/20/2019 08/09/19   Jackson Latino L, PA  metFORMIN (GLUCOPHAGE) 500 MG tablet TAKE 1 TABLET BY MOUTH 2 TIMES EVERY DAY WITH MORNING AND EVENING MEALS 02/12/20   Glendale Chard, MD  Multiple Vitamin (MULTIVITAMIN WITH MINERALS) TABS tablet Take 1 tablet by mouth daily. Centrum    [provider]  olmesartan (BENICAR) 20 MG tablet Take 1 tablet (20 mg total) by mouth daily. 02/12/20   Glendale Chard, MD    Allergies    Aspirin, Hydroxyzine, and Valium [diazepam]  Review of Systems   Review of Systems  Constitutional: Negative for chills and fever.  HENT:       + tinnitus  Eyes: Negative for visual disturbance.  Respiratory: Positive for shortness of breath.   Cardiovascular: Negative for chest pain.  Gastrointestinal: Negative for abdominal pain, nausea and vomiting.  Neurological:  Negative for dizziness, seizures, syncope, weakness, light-headedness, numbness and headaches.    Physical Exam Updated Vital Signs BP (!) 151/83 (BP Location: Right Arm)   Pulse 72   Temp 98.2 F (36.8 C) (Oral)   Resp (!) 23   Ht '5\' 3"'  (1.6 m)   Wt 79.4 kg   SpO2 99%   BMI 31.00 kg/m   Physical Exam Vitals and nursing note reviewed.  Constitutional:      Appearance: She is not ill-appearing.  HENT:     Head: Normocephalic and atraumatic.     Right Ear: Tympanic membrane normal.     Left Ear: Tympanic membrane normal.  Eyes:     Conjunctiva/sclera: Conjunctivae normal.  Cardiovascular:     Rate and Rhythm: Normal rate and regular rhythm.     Pulses: Normal pulses.  Pulmonary:  Effort: Pulmonary effort is normal.     Breath sounds: Normal breath sounds. No decreased breath sounds, wheezing, rhonchi or rales.  Abdominal:     Palpations: Abdomen is soft.     Tenderness: There is no abdominal tenderness.  Musculoskeletal:     Cervical back: Neck supple.  Skin:    General: Skin is warm and dry.  Neurological:     Mental Status: She is alert.     Comments: CN 3-12 grossly intact A&O x4 GCS 15 Sensation and strength intact Gait nonataxic including with tandem walking Coordination with finger-to-nose WNL Neg romberg, neg pronator drift     ED Results / Procedures / Treatments   Labs (all labs ordered are listed, but only abnormal results are displayed) Labs Reviewed  COMPREHENSIVE METABOLIC PANEL - Abnormal; Notable for the following components:      Result Value   Glucose, Bld 138 (*)    Calcium 8.7 (*)    Albumin 3.4 (*)    All other components within normal limits  CBC WITH DIFFERENTIAL/PLATELET  BRAIN NATRIURETIC PEPTIDE  TROPONIN I (HIGH SENSITIVITY)  TROPONIN I (HIGH SENSITIVITY)    EKG EKG Interpretation  Date/Time:  'Sunday Apr 05 2020 07:10:26 EDT Ventricular Rate:  63 PR Interval:    QRS Duration: 86 QT Interval:  400 QTC  Calculation: 410 R Axis:   42 Text Interpretation: Sinus rhythm Low voltage, precordial leads Minimal ST elevation, inferior leads No significant change was found Interpretation limited secondary to artifact Confirmed by Rancour, Stephen ([id number]) on 04/05/2020 7:13:41 AM   Radiology DG Chest 2 View  Result Date: 04/05/2020 CLINICAL DATA:  Short of breath EXAM: CHEST - 2 VIEW COMPARISON:  None. FINDINGS: Normal mediastinum and cardiac silhouette. Normal pulmonary vasculature. No evidence of effusion, infiltrate, or pneumothorax. No acute bony abnormality. IMPRESSION: No acute cardiopulmonary process. Electronically Signed   By: Stewart  Edmunds M.D.   On: 04/05/2020 07:03    Procedures Procedures (including critical care time)  Medications Ordered in ED Medications - No data to display  ED Course  I have reviewed the triage vital signs and the nursing notes.  Pertinent labs & imaging results that were available during my care of the patient were reviewed by me and considered in my medical decision making (see chart for details).    MDM Rules/Calculators/A&P                      78'  year old female presents the ED and is a complaining of tenderness to her right ear which is intermittent the past 3 years.  She was being evaluated by Dr. Remer Macho with ENT concerned that patient may need hearing aids as tinnitus may be masking as hearing loss.  States she is filled out many forms to be evaluated for hearing aids however has never heard back and is frustrated.  She states her ringing in her ear started at 10 AM yesterday and has been persistent.  She states when this happens her blood pressure will spike.  EMS noted blood pressure to be 208/100.  Pressure in the ED 151/83.  Patient's only complaint is tenderness.  Per triage report/chief complaint she was noted to have shortness of breath however she states she will feel short of breath when the ringing in the ear gets bad.  She has no shortness of  breath without tinnitus.  She is not short of breath currently.  She denies chest pain with the shortness of breath.  Was recently  seen at the end of 2020 in the ED for same symptoms including shortness of breath as well as chest pain.  She had a negative cardiac work-up at that point.  Chart review patient had an MRI and an MRA brain done in 2019 after presenting to the ED with tinnitus, there were no acute findings.  No focal neuro deficits on exam today.  TMs are clear bilaterally.  Abnormalities appreciated in the ears themselves.  Work was ordered and obtained prior to patient being seen for work-up of shortness of breath.  Will await labs at this time however feel patient needs to follow-up with ENT, have advised that she follows up with either Dr. Wilburn Cornelia or I will give her another referral to another ENT for further evaluation.  We do not have an audiologist on-call to refer patient to.   Labwork for SOB reassuring at this time EKG without new ischemic changes CBC without leukocytosis. Hgb stable at 12.7 CMP with glucose 138, no other acute findings Initial troponin of 10, will repeat BNP 37.8 CXR without any acute findings today   Repeat troponin of 10, no change in delta trop. Pt continues to sat 100% on RA without complaints of SOB. Feel she is stable for discharge home. She is very concerned regarding the ringing in her right ear and wants it to be looked at. Pt feels as though her visits with Dr. Wilburn Cornelia have not gotten her anywhere and would like to see someone else, will have her follow up with Dr. Benjamine Mola ENT. Pt advised to call office tomorrow to schedule an appointment. Advised to follow up with PCP as well. Pt is in agreement with plan and stable for discharge home.   This note was prepared using Dragon voice recognition software and may include unintentional dictation errors due to the inherent limitations of voice recognition software.  Final Clinical Impression(s) / ED  Diagnoses Final diagnoses:  Tinnitus of right ear  Essential hypertension    Rx / DC Orders ED Discharge Orders    None       Eustaquio Maize, PA-C 04/05/20 2119    Malvin Johns, MD 04/05/20 [phone]

## 2020-04-05 NOTE — ED Notes (Signed)
Unsuccessful blood draw attempt. 

## 2020-04-05 NOTE — Discharge Instructions (Addendum)
Continue taking your blood pressure medication as prescribed Follow up with ENT Dr. Benjamine Mola for a second opinion regarding the ringing in your right ear Follow up with your PCP Roma Schanz, NP regarding your ED visit today

## 2020-04-09 ENCOUNTER — Ambulatory Visit: Payer: Medicare Other

## 2020-04-09 NOTE — Chronic Care Management (AMB) (Deleted)
Chronic Care Management Pharmacy  Name: Allison Franco  MRN: [id number] DOB: [birth year]  Chief Complaint/ HPI  Allison Franco,  79 y.o. , female presents for their Initial CCM visit with the clinical pharmacist In office.  PCP : [name], FNP  Their chronic conditions include: Hypertension, diabetes   Office Visits: 01/15/20 OV and AWV: Diabetic follow up visit. Prescription for amoxicillin 553m as prophylactic before dental treatment. Labs ordered (HgbA1c, CMP14+EGFR, lipid panel).  Consult Visits: 04/05/20 ED Visit: Presented with tinnitus and SOB. ENT seen previously and recommended hearing aids but pt has not been able to obtain them. Ringing started 10am day prior to ER visit. When this happens pr reports her BP spikes and she gets short of breath so she took an extra BP tablet. BP was 208/100 when EMS arrived and 151/83 in ED. Discharged with referral to ENT.   10/13/19 ED visit: Presented for tinnitus and hyertension. Ringing began at 9am followed by chest pressure and SOB that lasted 5 minutes. BP was 154/90 when pt checked at that time. Labs and imaging (Chest Xray, troponin x2, BMP, CBC and cardiac monitor). Discharged with audiology follow up.   CCM Encounters:  10/15/19 PharmD: Comprehensive medication review performed. [facility]    Medications: Outpatient Encounter Medications as of 04/09/2020  Medication Sig  . acetaminophen (TYLENOL) 500 MG tablet Take 1 tablet (500 mg total) by mouth every 6 (six) hours as needed for mild pain, moderate pain or headache. Reported on 05/10/2016  . Alcohol Swabs (ALCOHOL WIPES) 70 % PADS by Does not apply route. Use with blood sugar check and injection of insulin  . amoxicillin (AMOXIL) 500 MG capsule Take 4 caps prior to dental procedure  . blood glucose meter kit and supplies KIT Dispense based on patient and insurance preference. Use up to four times daily as directed. (FOR ICD-9 250.00,  250.01).  .Marland KitchenEPINEPHrine (EPIPEN 2-PAK) 0.3 mg/0.3 mL IJ SOAJ injection Inject 0.3 mLs (0.3 mg total) into the muscle once as needed (for severe allergic reaction). CAll 911 immediately if you have to use this medicine (Patient not taking: Reported on 01/15/2020)  . HYDROcodone-acetaminophen (NORCO/VICODIN) 5-325 MG tablet Take 1 tablet by mouth every 6 (six) hours as needed for moderate pain. (Patient not taking: Reported on 08/20/2019)  . metFORMIN (GLUCOPHAGE) 500 MG tablet TAKE 1 TABLET BY MOUTH 2 TIMES EVERY DAY WITH MORNING AND EVENING MEALS  . Multiple Vitamin (MULTIVITAMIN WITH MINERALS) TABS tablet Take 1 tablet by mouth daily. Centrum  . olmesartan (BENICAR) 20 MG tablet Take 1 tablet (20 mg total) by mouth daily.   No facility-administered encounter medications on file as of 04/09/2020.     Current Diagnosis/Assessment:  Goals Addressed   None     Diabetes   Recent Relevant Labs: Lab Results  Component Value Date/Time   HGBA1C 6.4 (H) 01/15/2020 11:34 AM   HGBA1C 6.6 (H) 08/06/2019 11:12 PM    Kidney Function Lab Results  Component Value Date/Time   CREATININE 0.76 04/05/2020 07:01 AM   CREATININE 0.87 01/15/2020 11:34 AM   GFRNONAA >60 04/05/2020 07:01 AM   GFRAA >60 04/05/2020 07:01 AM     Checking BG: {CHL HP Blood Glucose Monitoring Frequency:[phone]}  Recent FBG Readings: Recent pre-meal BG readings: *** Recent 2hr PP BG readings:  *** Recent HS BG readings: *** Patient has failed these meds in past: Levemir, Lantus , Tradjenta,  Patient is currently {CHL Controlled/[phone]} on the following medications:  -Metformin  547m twice daily  Last diabetic Foot exam: No results found for: HMDIABEYEEXA  Last diabetic Eye exam: No results found for: HMDIABFOOTEX   We discussed: {CHL HP Upstream Pharmacy discussion:[phone]}  Plan  Continue {CHL HP Upstream Pharmacy Plans:[phone]}    Hypertension   BP today is:  {CHL HP UPSTREAM  Pharmacist BP [phone]}  Office blood pressures are  BP Readings from Last 3 Encounters:  04/05/20 131/73  01/15/20 126/84  01/15/20 126/84    Patient has failed these meds in the past: Lisinopril Patient is currently {CHL Controlled/Uncontrolled:[phone]} on the following medications:  -Olmesartan 219mdaily  Patient checks BP at home {CHL HP BP Monitoring Frequency:[phone]}  Patient home BP readings are ranging: ***  We discussed {CHL HP Upstream Pharmacy discussion:[phone]}  Plan  Continue {CHL HP Upstream Pharmacy Plans:[phone]}   Hyperlipidemia   Lipid Panel     Component Value Date/Time   CHOL 170 01/15/2020 1134   TRIG 163 (H) 01/15/2020 1134   HDL 43 01/15/2020 1134   CHOLHDL 4.0 01/15/2020 1134   CHOLHDL 4.5 07/04/2018 0327   VLDL 25 07/04/2018 0327   LDLCALC 99 01/15/2020 1134   LABVLDL 28 01/15/2020 1134     The 10-year ASCVD risk score (GMikey BussingC Jr., et al., 2013) is: 29.9%   Values used to calculate the score:     Age: [age]     Sex: Female     Is Non-Hispanic African American: Yes     Diabetic: Yes     Tobacco smoker: No     Systolic Blood Pressure: 13621mHg     Is BP treated: Yes     HDL Cholesterol: 43 mg/dL     Total Cholesterol: 170 mg/dL   Patient has failed these meds in past: Simvastatin Patient is currently {CHL Controlled/Uncontrolled:[phone]} on the following medications: ***  We discussed:  {CHL HP Upstream Pharmacy discussion:[phone]}  Plan  Continue {CHL HP Upstream Pharmacy Plans:[phone]}   Other Diagnosis:***    Patient has failed these meds in past: *** Patient is currently {CHL Controlled/Uncontrolled:[phone]} on the following medications: ***  We discussed:  {CHL HP Upstream Pharmacy discussion:[phone]}  Plan  Continue {CHL HP Upstream Pharmacy Plans:[phone]}  Vaccines   Reviewed and discussed patient's vaccination history.    Immunization History  Administered Date(s)  Administered  . Influenza Whole 11/18/2009  . Influenza-Unspecified 09/24/2018  . PFIZER SARS-COV-2 Vaccination 02/14/2020, 03/10/2020  . Pneumococcal Polysaccharide-23 02/01/2007  . Td 02/01/2007    Plan  Recommended patient receive *** vaccine in *** office/pharmacy.   Medication Management   Pt uses [facility] pharmacy for all medications Uses pill box? {Yes or If no, why not?:20788} Pt endorses ***% compliance  We discussed: ***  Plan  {US Pharmacy PlVIFX:25271}  Follow up: *** month phone visit  ***

## 2020-04-10 ENCOUNTER — Telehealth: Payer: Self-pay | Admitting: Nurse Practitioner

## 2020-04-10 NOTE — Chronic Care Management (AMB) (Signed)
  Care Management   Note  04/10/2020 Name: Allison Franco MRN: [id number] DOB: 1941-07-27  Allison Franco is a 79 y.o. year old female who is a primary care patient of [name], [place] and is actively engaged with the care management team. I reached out to [name] by phone today to assist with re-scheduling an initial visit with the Pharmacist.  Follow up plan: Unsuccessful telephone outreach attempt made patient does not have voicemail setup. The care management team will reach out to the patient again over the next 7 days. If patient returns call to provider office, please advise to call Sharon at [phone].  [place], [place] [address] Direct Dial: [phone] [email or web address]

## 2020-04-16 NOTE — Chronic Care Management (AMB) (Signed)
  Chronic Care Management   Note  04/16/2020 Name: [name] MRN: [id number] DOB: Oct 24, 1941  Allison Franco is a 79 y.o. year old female who is a primary care patient of [name], [place] and is actively engaged with the care management team. I reached out to [name] by phone today to assist with re-scheduling an initial visit with the Pharmacist  Follow up plan: Face to Face appointment with care management team member scheduled for: 05/25/2020.  [place], [place] [address] Direct Dial: [phone] [email or web address]: [email or web address]

## 2020-05-18 ENCOUNTER — Ambulatory Visit (INDEPENDENT_AMBULATORY_CARE_PROVIDER_SITE_OTHER): Payer: Medicare (Managed Care) | Admitting: Nurse Practitioner

## 2020-05-18 ENCOUNTER — Encounter: Payer: Self-pay | Admitting: Nurse Practitioner

## 2020-05-18 ENCOUNTER — Other Ambulatory Visit: Payer: Self-pay

## 2020-05-18 VITALS — BP 136/86 | HR 94 | Temp 97.7°F | Ht 63.0 in | Wt 186.6 lb

## 2020-05-18 DIAGNOSIS — E2839 Other primary ovarian failure: Secondary | ICD-10-CM | POA: Diagnosis not present

## 2020-05-18 DIAGNOSIS — M25561 Pain in right knee: Secondary | ICD-10-CM

## 2020-05-18 DIAGNOSIS — E119 Type 2 diabetes mellitus without complications: Secondary | ICD-10-CM

## 2020-05-18 DIAGNOSIS — M25562 Pain in left knee: Secondary | ICD-10-CM

## 2020-05-18 DIAGNOSIS — Z1159 Encounter for screening for other viral diseases: Secondary | ICD-10-CM

## 2020-05-18 DIAGNOSIS — G8929 Other chronic pain: Secondary | ICD-10-CM

## 2020-05-18 NOTE — Progress Notes (Signed)
This visit occurred during the SARS-CoV-2 public health emergency.  Safety protocols were in place, including screening questions prior to the visit, additional usage of staff PPE, and extensive cleaning of exam room while observing appropriate contact time as indicated for disinfecting solutions.  Subjective:     Patient ID: Allison Franco , female    DOB: 1941/04/27 , 79 y.o.   MRN: [id number]   Chief Complaint  Patient presents with  . Diabetes  . Hypertension    HPI     Diabetes She presents for her follow-up diabetic visit. She has type 2 diabetes mellitus. There are no hypoglycemic associated symptoms. Pertinent negatives for hypoglycemia include no confusion, dizziness, headaches or nervousness/anxiousness. There are no diabetic associated symptoms. Pertinent negatives for diabetes include no chest pain, no fatigue, no polydipsia, no polyphagia and no polyuria. There are no hypoglycemic complications. Symptoms are stable. There are no diabetic complications. Risk factors for coronary artery disease include sedentary lifestyle and obesity. When asked about current treatments, none were reported. She is following a generally healthy diet. When asked about meal planning, she reported none. She has not had a previous visit with a dietitian. She rarely participates in exercise. (She has not been checking her blood sugar due to the cost of the strips and not working.) An ACE inhibitor/angiotensin II receptor blocker is being taken. She does not see a podiatrist.Eye exam is not current.  Hypertension This is a chronic problem. The current episode started more than 1 year ago. The problem is controlled. Pertinent negatives include no chest pain, headaches or palpitations. There are no associated agents to hypertension. Past treatments include angiotensin blockers. There are no compliance problems.  There is no history of angina. There is no history of chronic renal disease.  Knee Pain  The  incident occurred more than 1 week ago. There was no injury mechanism. Pain location: bilateral knees. The pain is at a severity of 1/10. The pain is mild. Pertinent negatives include no inability to bear weight. She reports no foreign bodies present. Nothing aggravates the symptoms. Treatments tried: biofreeze. The treatment provided significant relief.     Past Medical History:  Diagnosis Date  . Anxiety   . Constipation   . Depression    "following husband's death"  . Diabetes mellitus without complication ([place])   . Hypertension   . KNEE PAIN, BILATERAL 02/13/2009   Qualifier: Diagnosis of  By: Hassell Done FNP, Tori Milks    . Tinnitus      Family History  Problem Relation Age of Onset  . Diabetes Mother   . Hypertension Father      Current Outpatient Medications:  .  acetaminophen (TYLENOL) 500 MG tablet, Take 1 tablet (500 mg total) by mouth every 6 (six) hours as needed for mild pain, moderate pain or headache. Reported on 05/10/2016, Disp: 30 tablet, Rfl: 0 .  Alcohol Swabs (ALCOHOL WIPES) 70 % PADS, by Does not apply route. Use with blood sugar check and injection of insulin, Disp: , Rfl:  .  Cholecalciferol (VITAMIN D3) 25 MCG (1000 UT) CAPS, Take 1 capsule by mouth daily., Disp: , Rfl:  .  clonazePAM (KLONOPIN) 0.5 MG tablet, Take 0.5 mg by mouth 2 (two) times daily., Disp: , Rfl:  .  metFORMIN (GLUCOPHAGE) 500 MG tablet, TAKE 1 TABLET BY MOUTH 2 TIMES EVERY DAY WITH MORNING AND EVENING MEALS, Disp: 180 tablet, Rfl: 1 .  olmesartan (BENICAR) 20 MG tablet, Take 1 tablet (20 mg total) by mouth daily., Disp:  90 tablet, Rfl: 1 .  blood glucose meter kit and supplies KIT, Dispense based on patient and insurance preference. Use up to four times daily as directed. (FOR ICD-9 250.00, 250.01). (Patient not taking: Reported on 05/18/2020), Disp: 1 each, Rfl: 0   Allergies  Allergen Reactions  . Aspirin Other (See Comments)    insomnia  . Hydroxyzine Other (See Comments)    insomnia  .  Valium [Diazepam] Other (See Comments)    Insomnia, 05/10/16 pt states she took as young person- made her depressed     Review of Systems  Constitutional: Negative.  Negative for fatigue.  Respiratory: Negative.  Negative for cough.   Cardiovascular: Negative.  Negative for chest pain, palpitations and leg swelling.  Gastrointestinal: Negative.   Endocrine: Negative.  Negative for polydipsia, polyphagia and polyuria.  Musculoskeletal:       Bilateral knee pain  Skin: Negative.   Neurological: Negative.  Negative for dizziness and headaches.  Psychiatric/Behavioral: Negative for confusion. The patient is not nervous/anxious.      Today's Vitals   05/18/20 1054  BP: 136/86  Pulse: 94  Temp: 97.7 F (36.5 C)  TempSrc: Oral  Weight: 186 lb 9.6 oz (84.6 kg)  Height: _0  (1.6 m)  PainSc: 0-No pain   Body mass index is 33.05 kg/m.   Objective:  Physical Exam Constitutional:      General: She is not in acute distress.    Appearance: Normal appearance.  Cardiovascular:     Rate and Rhythm: Normal rate and regular rhythm.     Pulses: Normal pulses.     Heart sounds: Normal heart sounds. No murmur heard.   Pulmonary:     Effort: Pulmonary effort is normal. No respiratory distress.     Breath sounds: Normal breath sounds.  Skin:    General: Skin is warm and dry.     Capillary Refill: Capillary refill takes less than 2 seconds.  Neurological:     General: No focal deficit present.     Mental Status: She is alert and oriented to person, place, and time.  Psychiatric:        Mood and Affect: Mood normal.        Behavior: Behavior normal.        Thought Content: Thought content normal.        Judgment: Judgment normal.         Assessment And Plan:     1. Type 2 diabetes mellitus without complication, without long-term current use of insulin (HCC)  Chronic, she is not checking her blood sugars due to not being able to afford the strips.  Continue with current  medications  I have also referred her again to the ophthalmologist  Diabetic foot exam done with normal findings.  - Ambulatory referral to Ophthalmology - BMP8+eGFR - Hemoglobin A1c  2. Encounter for hepatitis C screening test for low risk patient  Will check for Hepatitis C screening due to being born between the years 64-1965 - Hepatitis C antibody  3. Decreased estrogen level  Will order bone density.   - DG Bone Density; Future  4. Chronic pain of both knees  She is able to tolerate the pain at this time.   [name], FNP    THE PATIENT IS ENCOURAGED TO PRACTICE SOCIAL DISTANCING DUE TO THE COVID-19 PANDEMIC.

## 2020-05-19 LAB — BMP8+EGFR
BUN/Creatinine Ratio: 23 (ref 12–28)
BUN: 20 mg/dL (ref 8–27)
CO2: 26 mmol/L (ref 20–29)
Calcium: 9.1 mg/dL (ref 8.7–10.3)
Chloride: 100 mmol/L (ref 96–106)
Creatinine, Ser: 0.86 mg/dL (ref 0.57–1.00)
GFR calc Af Amer: 75 mL/min/{1.73_m2} (ref >59)
GFR calc non Af Amer: 65 mL/min/{1.73_m2} (ref >59)
Glucose: 131 mg/dL — ABNORMAL HIGH (ref 65–99)
Potassium: 4.5 mmol/L (ref 3.5–5.2)
Sodium: 139 mmol/L (ref 134–144)

## 2020-05-19 LAB — HEPATITIS C ANTIBODY: Hep C Virus Ab: <0.1 s/co ratio (ref 0.0–0.9)

## 2020-05-19 LAB — HEMOGLOBIN A1C
Est. average glucose Bld gHb Est-mCnc: 134 mg/dL
Hgb A1c MFr Bld: 6.3 % — ABNORMAL HIGH (ref 4.8–5.6)

## 2020-05-20 NOTE — Chronic Care Management (AMB) (Deleted)
Chronic Care Management Pharmacy  Name: Allison Franco  MRN: [id number] DOB: Aug 04, 1941  Chief Complaint/ HPI  Allison Franco,  79 y.o. , female presents for their {Initial/Follow-up:3041532} CCM visit with the clinical pharmacist {CHL HP Upstream Pharm visit YBWL:[id number]}.  PCP : [name], FNP  Their chronic conditions include: HTN, Allergic rhinitis, Type 2 DM, Insomnia, Anemia, depression, anxiety, constipation.   Office Visits:*** 05/18/2020 - Referral to opthalmology due for eye exam. Not checking blood sugar at home due to affordability of strips. Foot exam completed in office. Referral for dexa scan.  02/14/2020 and 03/10/2020: Pfizer Covid vaccine. 01/15/2020 - Medicare AWV. - recommended eye exam, bone density ordered in June 2020. Rx for prevnar 13 and tdap sent to pharmacy. Shingles vaccine discussed.  01/15/2020 - ordered prophylactic dental pre-med amoxicillin 500 mg.  Consult Visit:*** 04/05/2020 - ED visit for tinnitus and SOB. Referral to ENT (Dr. Benjamine Mola). Patient has been unable to obtain hearing aids.  10/08/2019 - ENT - patient recommended for hearing aid but unable to afford in 2020. Sought care at [facility]. 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  CCM Visit: *** 04/10/2020 - enrolled or scheduled with CCM pharmacist for 06/28. 03/24/2020 - Patient scheduled with pharmacist for 05/13.   Medications: Outpatient Encounter Medications as of 05/25/2020  Medication Sig   acetaminophen (TYLENOL) 500 MG tablet Take 1 tablet (500 mg total) by mouth every 6 (six) hours as needed for mild pain, moderate pain or headache. Reported on 05/10/2016   Alcohol Swabs (ALCOHOL WIPES) 70 % PADS by Does not apply route. Use with blood sugar check and injection of insulin   blood glucose meter kit and supplies KIT Dispense based on patient and insurance preference. Use up to four times daily as directed. (FOR ICD-9 250.00, 250.01).  (Patient not taking: Reported on 05/18/2020)   Cholecalciferol (VITAMIN D3) 25 MCG (1000 UT) CAPS Take 1 capsule by mouth daily.   clonazePAM (KLONOPIN) 0.5 MG tablet Take 0.5 mg by mouth 2 (two) times daily.   metFORMIN (GLUCOPHAGE) 500 MG tablet TAKE 1 TABLET BY MOUTH 2 TIMES EVERY DAY WITH MORNING AND EVENING MEALS   olmesartan (BENICAR) 20 MG tablet Take 1 tablet (20 mg total) by mouth daily.   No facility-administered encounter medications on file as of 05/25/2020.   Allergies  Allergen Reactions   Aspirin Other (See Comments)    insomnia   Hydroxyzine Other (See Comments)    insomnia   Valium [Diazepam] Other (See Comments)    Insomnia, 05/10/16 pt states she took as young person- made her depressed    SDOH Screenings   Alcohol Screen:    Last Alcohol Screening Score (AUDIT):   Depression (PHQ2-9): Low Risk    PHQ-2 Score: 3  Financial Resource Strain: Low Risk    Difficulty of Paying Living Expenses: Not hard at all  Food Insecurity: No Food Insecurity   Worried About Charity fundraiser in the Last Year: Never true   Ran Out of Food in the Last Year: Never true  Housing:    Last Housing Risk Score:   Physical Activity: Inactive   Days of Exercise per Week: 0 days   Minutes of Exercise per Session: 0 min  Social Connections:    Frequency of Communication with Friends and Family:    Frequency of Social Gatherings with Friends and Family:    Attends Religious Services:    Active Member of Clubs or Organizations:    Attends  Music therapist:    Marital Status:   Stress: No Stress Concern Present   Feeling of Stress : Not at all  Tobacco Use: Low Risk    Smoking Tobacco Use: Never Smoker   Smokeless Tobacco Use: Never Used  Transportation Needs: No Transportation Needs   Lack of Transportation (Medical): No   Lack of Transportation (Non-Medical): No     Current Diagnosis/Assessment:  Goals Addressed   None     Diabetes     Recent Relevant Labs: Lab Results  Component Value Date/Time   HGBA1C 6.3 (H) 05/18/2020 11:31 AM   HGBA1C 6.4 (H) 01/15/2020 11:34 AM     Checking BG: {CHL HP Blood Glucose Monitoring Frequency:[phone]}  Recent FBG Readings: Recent pre-meal BG readings: *** Recent 2hr PP BG readings:  *** Recent HS BG readings: *** Patient has failed these meds in past: levemir, lantus, tradjenta,  Patient is currently {CHL Controlled/Uncontrolled:[phone]} on the following medications: metformin 500 mg bid, alcohol swabs, blood glucose meter kit and supplies  Last diabetic Foot exam: 04/2020  Last diabetic Eye exam: No results found for: HMDIABFOOTEX   We discussed: {CHL HP Upstream Pharmacy discussion:[phone]}  Plan  Continue {CHL HP Upstream Pharmacy [phone]},  Hypertension   BP today is:  {CHL HP UPSTREAM Pharmacist BP ranges:[id number]}  Office blood pressures are  BP Readings from Last 3 Encounters:  05/18/20 136/86  04/05/20 131/73  01/15/20 126/84    Patient has failed these meds in the past: lisinopril,  Patient is currently controlled/uncontrolled*** on the following medications: olmesartan 20 mg daily  Patient checks BP at home {CHL HP BP Monitoring Frequency:[phone]}  Patient home BP readings are ranging: ***  We discussed {CHL HP Upstream Pharmacy discussion:[phone]}  Plan  Continue {CHL HP Upstream Pharmacy [phone]}     and  Other Diagnosis:Anxiety and Depression    Patient has failed these meds in past: *** Patient is currently {CHL Controlled/Uncontrolled:[phone]} on the following medications: clonazepam bid  We discussed:  {CHL HP Upstream Pharmacy discussion:[phone]}  Plan  Continue {CHL HP Upstream Pharmacy [phone]}  Osteopenia / Osteoporosis   Last DEXA Scan: *** recommended in AWV and 04/2020 visit - not in chart  T-Score femoral neck: ***  T-Score total hip: ***  T-Score lumbar spine:  ***  T-Score forearm radius: ***  10-year probability of major osteoporotic fracture: ***  10-year probability of hip fracture: ***  No results found for: VD25OH   Patient {is;is not an osteoporosis candidate:23886}  Patient has failed these meds in past: calcium carbonate, calcium with d,  Patient is currently {CHL Controlled/Uncontrolled:[phone]} on the following medications: vitamin d 1000 units daily  We discussed:  {Osteoporosis Counseling:23892}  Plan  Continue {CHL HP Upstream Pharmacy [phone]}  Health Maintenance   Patient is currently {CHL Controlled/Uncontrolled:[phone]} on the following medications:   Acetaminophen 500 mg q6h prn mild pain, moderate pain or headahce  We discussed:  ***  Plan  Continue {CHL HP Upstream Pharmacy PQZRA:[id number]}  Vaccines   Reviewed and discussed patient's vaccination history. ***updated tetanus? Prevnar 13? Shingrix? - recommended during AWV***    Immunization History  Administered Date(s) Administered   Influenza Whole 11/18/2009   Influenza-Unspecified 09/24/2018   PFIZER SARS-COV-2 Vaccination 02/14/2020, 03/10/2020   Pneumococcal Polysaccharide-23 02/01/2007   Td 02/01/2007    Plan  Recommended patient receive *** vaccine in *** office/pharmacy.   Medication Management   Pt uses ***[facility] for all medications Uses pill box? {Yes or If no, why not?:20788}  Pt endorses ***% compliance  We discussed: ***  Plan  {US Pharmacy SYVG:86282}    Follow up: *** month phone visit  ***

## 2020-05-25 ENCOUNTER — Ambulatory Visit: Payer: Medicare Other

## 2020-05-25 ENCOUNTER — Other Ambulatory Visit: Payer: Self-pay

## 2020-05-25 DIAGNOSIS — E119 Type 2 diabetes mellitus without complications: Secondary | ICD-10-CM

## 2020-05-25 DIAGNOSIS — I10 Essential (primary) hypertension: Secondary | ICD-10-CM

## 2020-05-25 NOTE — Chronic Care Management (AMB) (Signed)
Chronic Care Management Pharmacy  Name: Allison Franco  MRN: [id number] DOB: 04-Sep-1941  Chief Complaint/ HPI  Allison Franco,  79 y.o. , female presents for their Initial CCM visit with the clinical pharmacist In office.  PCP : Allison Brine, FNP  Their chronic conditions include: Hypertension, Type 2 Diabetes mellitus, Insomnia, anxiety/depression.  Office Visits: 05/18/2020 OV: Presented for HTN and DM follow up. Mild bilateral knee pain noted. Pt uses Biofreeze with significant relief. Pt not checking blood sugar at home due to affordability of strips. Referred to ophthalmology due for eye exam. Diabetic foot exam performed, normal findings. Bone density scan ordered. Labs ordered (BMP8+EGFR, HgbA1c, HepC antibody).   01/15/2020 AWV and OV:  Presented for DM follow up visit. Recommended eye exam, bone density ordered in June 2020. Rx for Prevnar13 13 and Tdap sent to pharmacy. Shingles vaccine discussed. Labs ordered (CMP14+EGFR, Lipid panel, HgbA1c). Prophylactic amoxicillin ordered for dental procedure.  Consult Visits: 04/05/2020 ED visit: Presented for tinnitus and SOB related to ears ringing. Referred to ENT (Dr. Benjamine Mola). Patient has been unable to obtain hearing aids.   10/08/2019  ENT OV w/ Dr. Wilburn Cornelia: Presented for follow up. Pt recommended for hearing aid but unable to afford in 2020. Sought care at [facility] audiology. 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  Medications: Outpatient Encounter Medications as of 05/25/2020  Medication Sig  . acetaminophen (TYLENOL) 650 MG CR tablet Take 1,300 mg by mouth in the morning and at bedtime.  . Cholecalciferol (VITAMIN D3) 25 MCG (1000 UT) CAPS Take 1 capsule by mouth daily.  . clonazePAM (KLONOPIN) 0.5 MG tablet Take 0.5 mg by mouth at bedtime.   . metFORMIN (GLUCOPHAGE) 500 MG tablet TAKE 1 TABLET BY MOUTH 2 TIMES EVERY DAY WITH MORNING AND EVENING MEALS (Patient taking differently: Take 500 mg by  mouth daily after lunch. )  . olmesartan (BENICAR) 20 MG tablet Take 1 tablet (20 mg total) by mouth daily.  [name] acetaminophen (TYLENOL) 500 MG tablet Take 1 tablet (500 mg total) by mouth every 6 (six) hours as needed for mild pain, moderate pain or headache. Reported on 05/10/2016 (Patient not taking: Reported on 05/27/2020)  . Alcohol Swabs (ALCOHOL WIPES) 70 % PADS by Does not apply route. Use with blood sugar check and injection of insulin (Patient not taking: Reported on 05/27/2020)  . blood glucose meter kit and supplies KIT Dispense based on patient and insurance preference. Use up to four times daily as directed. (FOR ICD-9 250.00, 250.01). (Patient not taking: Reported on 05/18/2020)   No facility-administered encounter medications on file as of 05/25/2020.   Allergies  Allergen Reactions  . Aspirin Other (See Comments)    insomnia  . Hydroxyzine Other (See Comments)    insomnia  . Valium [Diazepam] Other (See Comments)    Insomnia, 05/10/16 pt states she took as young person- made her depressed    SDOH Screenings   Alcohol Screen:   . Last Alcohol Screening Score (AUDIT):   Depression (PHQ2-9): Low Risk   . PHQ-2 Score: 3  Financial Resource Strain: High Risk  . Difficulty of Paying Living Expenses: Hard  Food Insecurity: No Food Insecurity  . Worried About Charity fundraiser in the Last Year: Never true  . Ran Out of Food in the Last Year: Never true  Housing:   . Last Housing Risk Score:   Physical Activity: Inactive  . Days of Exercise per Week: 0 days  . Minutes of Exercise  per Session: 0 min  Social Connections:   . Frequency of Communication with Friends and Family:   . Frequency of Social Gatherings with Friends and Family:   . Attends Religious Services:   . Active Member of Clubs or Organizations:   . Attends Archivist Meetings:   [name] Marital Status:   Stress: No Stress Concern Present  . Feeling of Stress : Not at all  Tobacco Use: Low Risk   .  Smoking Tobacco Use: Never Smoker  . Smokeless Tobacco Use: Never Used  Transportation Needs: No Transportation Needs  . Lack of Transportation (Medical): No  . Lack of Transportation (Non-Medical): No   SDOH Interventions     Most Recent Value  SDOH Interventions  Financial Strain Interventions Other (Comment)  [Will send Rx for BG test strips to [facility] to bill on Medicare Part B]      Current Diagnosis/Assessment:  Goals Addressed            This Visit's Progress   . Pharmacy Care Plan       CARE PLAN ENTRY (see longitudinal plan of care for additional care plan information)  Current Barriers:  . Chronic Disease Management support, education, and care coordination needs related to Hypertension, Diabetes, Depression, and Anxiety   Hypertension BP Readings from Last 3 Encounters:  05/18/20 136/86  04/05/20 131/73  01/15/20 126/84   . Pharmacist Clinical Goal(s): o Over the next 90 days, patient will work with PharmD and providers to achieve BP goal <130/80 . Current regimen:  o Olmesartan 56m daily . Interventions: o Stressed importance of medication adherence . Patient self care activities - Over the next 90 days, patient will: o Check BP several times weekly, document, and provide at future appointments o Ensure daily salt intake < 2300 mg/day o Exercise 30 minutes a day 5 day per week  Diabetes Lab Results  Component Value Date/Time   HGBA1C 6.3 (H) 05/18/2020 11:31 AM   HGBA1C 6.4 (H) 01/15/2020 11:34 AM   . Pharmacist Clinical Goal(s): o Over the next 90 days, patient will work with PharmD and providers to maintain A1c goal <7% . Current regimen:  o Metformin 5029mtwice daily . Interventions: o Patient is only taking metformin once daily. Collaborated with PCP via in basket message regarding patient's metformin dose o Patient mentioned she has been unable to afford blood sugar test strips. Will request PCP send prescription for test strips to  [facility] because [facility] cannot bill Medicare Part B for diabetic testing supplies . Patient self care activities - Over the next 90 days, patient will: o Check blood sugar every 3-4 days and if symptomatic, document, and provide at future appointments if able to obtain strips from pharmacy o Contact provider with any episodes of hypoglycemia  Knee Pain . Pharmacist Clinical Goal(s) o Over the next 30 days, patient will work with PharmD and providers to relieve knee pain . Current regimen:  o Biofreeze as needed . Interventions: o Discussed Voltaren gel which is available over the counter without a prescription o Collaborate with PCP to request a prescription for diclofenac 1% gel to see if insurance will cover . Patient self care activities - Over the next 30 days, patient will: o Try diclofenac 1% gel topically as needed for knee pain if PCP agrees  Medication management . Pharmacist Clinical Goal(s): o Over the next 90 days, patient will work with PharmD and providers to achieve optimal medication adherence . [facility] . Interventions o Comprehensive medication review performed. o Continue current medication management strategy . Patient self care activities - Over the next 90 days, patient will: o Focus on medication adherence by considering use of pill box o Consider adherence packaging and delivery available at [facility] o Take medications as prescribed o Report any questions or concerns to PharmD and/or provider(s)  Initial goal documentation         Diabetes   Recent Relevant Labs: Lab Results  Component Value Date/Time   HGBA1C 6.3 (H) 05/18/2020 11:31 AM   HGBA1C 6.4 (H) 01/15/2020 11:34 AM    Checking BG: Never, cannot afford test strips  Recent FBG Readings: Recent pre-meal BG readings:  Recent 2hr PP BG readings:   Recent HS BG readings:  Patient has failed these meds in past: Levemir, Lantus,  Tradjenta,  Patient is currently controlled on the following medications:  Metformin 56m twice daily (pt only taking once daily)   Last diabetic Foot exam: 05/18/20  Last diabetic Eye exam: Not current, pt has refused   We discussed:   Exercise extensively  Pt walks around apartment complex at least twice weekly. Also walks to go shopping and to run errands when the weather is nice  Pt is only taking metformin once daily and states she has only been taking once daily for a long time  When she took it twice daily she said it made her too hungry (possible hypoglycemia judging by patient's reported symptoms)  Pt has not been checking BG because she was not able to get strips, [facility]   Discuss pt only taking metformin once daily with PCP, request refills  [facility]   Office blood pressures are  BP Readings from Last 3 Encounters:  05/18/20 136/86  04/05/20 131/73  01/15/20 126/84    Patient has failed these meds in the past: Lisinopril Patient is currently uncontrolled on the following medications:    Olmesartan 20 mg daily  Patient checks BP at home several times per month  Patient home BP readings are ranging: 134-136/80-85  We discussed:  Exercise extensively  Pt states that her BP goes up when her ears are ringing  Plan Continue current medications   Anxiety/Depression/Insomnia   Patient has failed these meds in past: Hydroxyzine, zolpidem Patient is currently controlled on the following medications:  Clonazepam 0.5 mg twice daily (pt only takes 1 tablet at night)  We discussed:    Pt only takes 1 tablet at night before bedtime because of drowsiness  Pt inquired about how to stop clonazepam  Recommended she decrease dose slowly to prevent withdrawal  Explained possible effects of stopping clonazepam  Plan Continue  current medications  Osteopenia / Osteoporosis Screening   [facility]  Patient has failed these meds in past: Calcium carbonate, calcium with d Patient is currently controlled on the following medications:   Cholecalciferol 1000 units daily  We discussed:  Pt has a new insurance starting 05/28/20 and is going to wait until she switches to get DEXA scan  Plan Continue current medications  Recommend Vitamin D level at next appointment  Knee Pain   Patient is currently uncontrolled on the following medications:   Biofreeze  We discussed:  Pt reports sharp left knee pain that comes and goes for the oast 2-3 months (sometimes right  knee also)  She has used Biofreeze which helps with pain, but does not completely relieve  OTC Voltaren, but will request Rx for diclofenac from PCP to see if insurance will cover  Plan Collaborate with PCP to recommend diclofenac 1% gel as needed for knee pain  Vaccines   Reviewed and discussed patient's vaccination history.  Immunization History  Administered Date(s) Administered  . Influenza Whole 11/18/2009  . Influenza-Unspecified 09/24/2018  . PFIZER SARS-COV-2 Vaccination 02/14/2020, 03/10/2020  . Pneumococcal Polysaccharide-23 02/01/2007  . Td 02/01/2007   Prevnar 13 received 01/16/20 Tdap received 01/09/20  Plan Recommended patient receive Shingrix vaccine at pharmacy.   Hearing loss and tinnitus   We discussed:   Pt saw ENT last year and was advised to get hearing aids but was unable to afford at that time  Plan Collaborate with embedded SW, Daneen Schick, via in basket message to determine available resources to help pt obtain hearing aids  Medication Management   [facility] for all medications Uses pill box? No - because she does not take many medications Pt endorses 100% compliance, but is not taking all medications as prescribed  We discussed:    Importance of taking medications every day as directed  Changing to new insurance on July 1st  Delivery option available at [facility] would be beneficial since patient does not have a car  Plan Continue current medication management strategy    Follow up: 3 month phone visit  Jannette Fogo, PharmD Clinical Pharmacist [facility] [phone]

## 2020-05-27 NOTE — Patient Instructions (Addendum)
Visit Information  Goals Addressed            This Visit's Progress   . Pharmacy Care Plan       CARE PLAN ENTRY (see longitudinal plan of care for additional care plan information)  Current Barriers:  . Chronic Disease Management support, education, and care coordination needs related to Hypertension, Diabetes, Depression, and Anxiety   Hypertension BP Readings from Last 3 Encounters:  05/18/20 136/86  04/05/20 131/73  01/15/20 126/84   . Pharmacist Clinical Goal(s): o Over the next 90 days, patient will work with PharmD and providers to achieve BP goal <130/80 . Current regimen:  o Olmesartan 20mg  daily . Interventions: o Stressed importance of medication adherence . Patient self care activities - Over the next 90 days, patient will: o Check BP several times weekly, document, and provide at future appointments o Ensure daily salt intake < 2300 mg/day o Exercise 30 minutes a day 5 day per week  Diabetes Lab Results  Component Value Date/Time   HGBA1C 6.3 (H) 05/18/2020 11:31 AM   HGBA1C 6.4 (H) 01/15/2020 11:34 AM   . Pharmacist Clinical Goal(s): o Over the next 90 days, patient will work with PharmD and providers to maintain A1c goal <7% . Current regimen:  o Metformin 500mg  twice daily . Interventions: o Patient is only taking metformin once daily. Collaborated with PCP via in basket message regarding patient's metformin dose o Patient mentioned she has been unable to afford blood sugar test strips. Will request PCP send prescription for test strips to [facility] because [facility] cannot bill Medicare Part B for diabetic testing supplies . Patient self care activities - Over the next 90 days, patient will: o Check blood sugar every 3-4 days and if symptomatic, document, and provide at future appointments if able to obtain strips from pharmacy o Contact provider with any episodes of hypoglycemia  Knee Pain . Pharmacist Clinical Goal(s) o Over the next 30  days, patient will work with PharmD and providers to relieve knee pain . Current regimen:  o Biofreeze as needed . Interventions: o Discussed Voltaren gel which is available over the counter without a prescription o Collaborate with PCP to request a prescription for diclofenac 1% gel to see if insurance will cover . Patient self care activities - Over the next 30 days, patient will: o Try diclofenac 1% gel topically as needed for knee pain if PCP agrees  Medication management . Pharmacist Clinical Goal(s): o Over the next 90 days, patient will work with PharmD and providers to achieve optimal medication adherence . Current pharmacy: [facility] and [place] . Interventions o Comprehensive medication review performed. o Continue current medication management strategy . Patient self care activities - Over the next 90 days, patient will: o Focus on medication adherence by considering use of pill box o Consider adherence packaging and delivery available at [facility] o Take medications as prescribed o Report any questions or concerns to PharmD and/or provider(s)  Initial goal documentation         Ms. Cordon was given information about Chronic Care Management services today including:  1. CCM service includes personalized support from designated clinical staff supervised by her physician, including individualized plan of care and coordination with other care providers 2. 24/7 contact phone numbers for assistance for urgent and routine care needs. 3. Standard insurance, coinsurance, copays and deductibles apply for chronic care management only during months in which we provide at least 20 minutes of these services. Most  insurances cover these services at 100%, however patients may be responsible for any copay, coinsurance and/or deductible if applicable. This service may help you avoid the need for more expensive face-to-face services. 4. Only one practitioner may furnish  and bill the service in a calendar month. 5. The patient may stop CCM services at any time (effective at the end of the month) by phone call to the office staff.  Patient agreed to services and verbal consent obtained.   The patient verbalized understanding of instructions provided today and agreed to receive a mailed copy of patient instruction and/or educational materials. Face to Face appointment with pharmacist scheduled for: 08/24/20 @ 3:30 PM  Jannette Fogo, PharmD Clinical Pharmacist [facility] [phone]    Managing Your Hypertension Hypertension is commonly called high blood pressure. This is when the force of your blood pressing against the walls of your arteries is too strong. Arteries are blood vessels that carry blood from your heart throughout your body. Hypertension forces the heart to work harder to pump blood, and may cause the arteries to become narrow or stiff. Having untreated or uncontrolled hypertension can cause heart attack, stroke, kidney disease, and other problems. What are blood pressure readings? A blood pressure reading consists of a higher number over a lower number. Ideally, your blood pressure should be below 120/80. The first ("top") number is called the systolic pressure. It is a measure of the pressure in your arteries as your heart beats. The second ("bottom") number is called the diastolic pressure. It is a measure of the pressure in your arteries as the heart relaxes. What does my blood pressure reading mean? Blood pressure is classified into four stages. Based on your blood pressure reading, your health care provider may use the following stages to determine what type of treatment you need, if any. Systolic pressure and diastolic pressure are measured in a unit called mm Hg. Normal  Systolic pressure: below 546.  Diastolic pressure: below 80. Elevated  Systolic pressure: 568-127.  Diastolic pressure: below  80. Hypertension stage 1  Systolic pressure: 517-001.  Diastolic pressure: 74-94. Hypertension stage 2  Systolic pressure: 496 or above.  Diastolic pressure: 90 or above. What health risks are associated with hypertension? Managing your hypertension is an important responsibility. Uncontrolled hypertension can lead to:  A heart attack.  A stroke.  A weakened blood vessel (aneurysm).  Heart failure.  Kidney damage.  Eye damage.  Metabolic syndrome.  Memory and concentration problems. What changes can I make to manage my hypertension? Hypertension can be managed by making lifestyle changes and possibly by taking medicines. Your health care provider will help you make a plan to bring your blood pressure within a normal range. Eating and drinking   Eat a diet that is high in fiber and potassium, and low in salt (sodium), added sugar, and fat. An example eating plan is called the DASH (Dietary Approaches to Stop Hypertension) diet. To eat this way: ? Eat plenty of fresh fruits and vegetables. Try to fill half of your plate at each meal with fruits and vegetables. ? Eat whole grains, such as whole wheat pasta, brown rice, or whole grain bread. Fill about one quarter of your plate with whole grains. ? Eat low-fat diary products. ? Avoid fatty cuts of meat, processed or cured meats, and poultry with skin. Fill about one quarter of your plate with lean proteins such as fish, chicken without skin, beans, eggs, and tofu. ? Avoid premade and processed  foods. These tend to be higher in sodium, added sugar, and fat.  Reduce your daily sodium intake. Most people with hypertension should eat less than 1,500 mg of sodium a day.  Limit alcohol intake to no more than 1 drink a day for nonpregnant women and 2 drinks a day for men. One drink equals 12 oz of beer, 5 oz of wine, or 1 oz of hard liquor. Lifestyle  Work with your health care provider to maintain a healthy body weight, or to  lose weight. Ask what an ideal weight is for you.  Get at least 30 minutes of exercise that causes your heart to beat faster (aerobic exercise) most days of the week. Activities may include walking, swimming, or biking.  Include exercise to strengthen your muscles (resistance exercise), such as weight lifting, as part of your weekly exercise routine. Try to do these types of exercises for 30 minutes at least 3 days a week.  Do not use any products that contain nicotine or tobacco, such as cigarettes and e-cigarettes. If you need help quitting, ask your health care provider.  Control any long-term (chronic) conditions you have, such as high cholesterol or diabetes. Monitoring  Monitor your blood pressure at home as told by your health care provider. Your personal target blood pressure may vary depending on your medical conditions, your age, and other factors.  Have your blood pressure checked regularly, as often as told by your health care provider. Working with your health care provider  Review all the medicines you take with your health care provider because there may be side effects or interactions.  Talk with your health care provider about your diet, exercise habits, and other lifestyle factors that may be contributing to hypertension.  Visit your health care provider regularly. Your health care provider can help you create and adjust your plan for managing hypertension. Will I need medicine to control my blood pressure? Your health care provider may prescribe medicine if lifestyle changes are not enough to get your blood pressure under control, and if:  Your systolic blood pressure is 130 or higher.  Your diastolic blood pressure is 80 or higher. Take medicines only as told by your health care provider. Follow the directions carefully. Blood pressure medicines must be taken as prescribed. The medicine does not work as well when you skip doses. Skipping doses also puts you at risk for  problems. Contact a health care provider if:  You think you are having a reaction to medicines you have taken.  You have repeated (recurrent) headaches.  You feel dizzy.  You have swelling in your ankles.  You have trouble with your vision. Get help right away if:  You develop a severe headache or confusion.  You have unusual weakness or numbness, or you feel faint.  You have severe pain in your chest or abdomen.  You vomit repeatedly.  You have trouble breathing. Summary  Hypertension is when the force of blood pumping through your arteries is too strong. If this condition is not controlled, it may put you at risk for serious complications.  Your personal target blood pressure may vary depending on your medical conditions, your age, and other factors. For most people, a normal blood pressure is less than 120/80.  Hypertension is managed by lifestyle changes, medicines, or both. Lifestyle changes include weight loss, eating a healthy, low-sodium diet, exercising more, and limiting alcohol. This information is not intended to replace advice given to you by your health care provider.  Make sure you discuss any questions you have with your health care provider. Document Revised: 03/08/2019 Document Reviewed: 10/12/2016 Elsevier Patient Education  [place].

## 2020-06-08 ENCOUNTER — Ambulatory Visit: Payer: Medicare Other

## 2020-06-08 DIAGNOSIS — I10 Essential (primary) hypertension: Secondary | ICD-10-CM

## 2020-06-08 DIAGNOSIS — E119 Type 2 diabetes mellitus without complications: Secondary | ICD-10-CM

## 2020-06-08 NOTE — Chronic Care Management (AMB) (Signed)
Chronic Care Management    Social Work Follow Up Note  06/08/2020 Name: Allison Franco MRN: [id number] DOB: 09/27/1941  Allison Franco is a 78 y.o. year old female who is a primary care patient of Moore, Janece, FNP. The CCM team was consulted for assistance with care coordination.   Review of patient status, including review of consultants reports, other relevant assessments, and collaboration with appropriate care team members and the patient's provider was performed as part of comprehensive patient evaluation and provision of chronic care management services.    SDOH (Social Determinants of Health) assessments performed: No    Outpatient Encounter Medications as of 06/08/2020  Medication Sig  . acetaminophen (TYLENOL) 500 MG tablet Take 1 tablet (500 mg total) by mouth every 6 (six) hours as needed for mild pain, moderate pain or headache. Reported on 05/10/2016 (Patient not taking: Reported on 05/27/2020)  . acetaminophen (TYLENOL) 650 MG CR tablet Take 1,300 mg by mouth in the morning and at bedtime.  . Alcohol Swabs (ALCOHOL WIPES) 70 % PADS by Does not apply route. Use with blood sugar check and injection of insulin (Patient not taking: Reported on 05/27/2020)  . blood glucose meter kit and supplies KIT Dispense based on patient and insurance preference. Use up to four times daily as directed. (FOR ICD-9 250.00, 250.01). (Patient not taking: Reported on 05/18/2020)  . Cholecalciferol (VITAMIN D3) 25 MCG (1000 UT) CAPS Take 1 capsule by mouth daily.  . clonazePAM (KLONOPIN) 0.5 MG tablet Take 0.5 mg by mouth at bedtime.   . metFORMIN (GLUCOPHAGE) 500 MG tablet TAKE 1 TABLET BY MOUTH 2 TIMES EVERY DAY WITH MORNING AND EVENING MEALS (Patient taking differently: Take 500 mg by mouth daily after lunch. )  . olmesartan (BENICAR) 20 MG tablet Take 1 tablet (20 mg total) by mouth daily.   No facility-administered encounter medications on file as of 06/08/2020.     Goals Addressed               This Visit's Progress     Patient Stated   .  "I need hearing aids" (pt-stated)        CARE PLAN ENTRY (see longitudinal plan of care for additional care plan information)  Current Barriers:  . Financial constraints related to cost of hearing aids . Limited knowledge of health plan benefits . Barriers surrounding the self health management of DM II and HTN   Social Work Clinical Goal(s):  . Over the next 45 days the patient will work with SW to better understand health plan benefits pertaining to coverage of hearing aid costs  CCM SW Interventions: Completed 06/08/20 . Inter-disciplinary care team collaboration (see longitudinal plan of care) . Collaboration with embedded PharmD, Courtney Caudill who requests SW assistance to identify resources to assist with hearing aid costs . SW performed chart review to note latest insurance card noted as dual coverage under [employer] . Successful outbound call placed to the patient who reports she is no longer with [employer] but has switched to [employer] o Patient has yet to receive new card in the mail with member ID . Patient reports she no longer wants to see Dr. Shoemaker . Advised the patient once card is received with member ID SW will assist with determining health plan benefits as well as locating an in network provider . Scheduled follow up call over the next 3 weeks  Patient Self Care Activities:  . Self administers medications as prescribed . Calls pharmacy for medication refills .   Performs ADL's independently . Calls provider office for new concerns or questions  Initial goal documentation         Follow Up Plan: SW will follow up with patient by phone over the next three weeks.   , BSW, CDP Social Worker, Certified Dementia Practitioner [facility] [phone]  Total time spent performing care coordination and/or care management activities with the patient by phone or face to face = 15 minutes.       

## 2020-06-08 NOTE — Patient Instructions (Signed)
Social Worker Visit Information  Goals we discussed today:  Goals Addressed              This Visit's Progress     Patient Stated     "I need hearing aids" (pt-stated)        CARE PLAN ENTRY (see longitudinal plan of care for additional care plan information)  Current Barriers:   Financial constraints related to cost of hearing aids  Limited knowledge of health plan benefits  Barriers surrounding the self health management of DM II and HTN   Social Work Clinical Goal(s):   Over the next 45 days the patient will work with SW to better understand health plan benefits pertaining to coverage of hearing aid costs  CCM SW Interventions: Completed 06/08/20  Inter-disciplinary care team collaboration (see longitudinal plan of care)  Collaboration with embedded PharmD, Jannette Fogo who requests SW assistance to identify resources to assist with hearing aid costs  SW performed chart review to note latest insurance card noted as dual coverage under [employer]  Successful outbound call placed to the patient who reports she is no longer with [facility] but has switched to [facility] o Patient has yet to receive new card in the mail with member ID  Patient reports she no longer wants to see Dr. Wilburn Cornelia  Advised the patient once card is received with member ID SW will assist with determining health plan benefits as well as locating an in network provider  Scheduled follow up call over the next 3 weeks  Patient Self Care Activities:   Self administers medications as prescribed  Calls pharmacy for medication refills  Performs ADL's independently  Calls provider office for new concerns or questions  Initial goal documentation         Follow Up Plan: SW will follow up with patient by phone over the next three weeks.   Daneen Schick, BSW, CDP Social Worker, Certified Dementia Practitioner [place] [facility] [place] [facility] [phone]

## 2020-06-25 ENCOUNTER — Other Ambulatory Visit: Payer: Self-pay | Admitting: Nurse Practitioner

## 2020-06-29 ENCOUNTER — Ambulatory Visit: Payer: Medicare Other

## 2020-06-29 DIAGNOSIS — I10 Essential (primary) hypertension: Secondary | ICD-10-CM

## 2020-06-29 DIAGNOSIS — E119 Type 2 diabetes mellitus without complications: Secondary | ICD-10-CM

## 2020-06-29 NOTE — Chronic Care Management (AMB) (Signed)
°  Chronic Care Management   Outreach Note  06/29/2020 Name: Allison Franco MRN: [id number] DOB: 11/10/1941  Referred by: [name], FNP Reason for referral : Care Coordination   SW placed a successful outbound call to the patient to assist with obtaining hearing aide benefit from patient health plan. Upon introducing self to the patient and the reason for today's call the patient stated "I will call you back" and disconnected the call.   Follow Up Plan: SW will await patient return call. SW has placed patient on SW schedule in 4 weeks to assist with ongoing care coordination needs.  Daneen Schick, BSW, CDP Social Worker, Certified Dementia Practitioner [place] [facility] [place] [facility] [phone]

## 2020-07-02 ENCOUNTER — Ambulatory Visit (INDEPENDENT_AMBULATORY_CARE_PROVIDER_SITE_OTHER): Payer: Medicare Other

## 2020-07-02 DIAGNOSIS — I10 Essential (primary) hypertension: Secondary | ICD-10-CM

## 2020-07-02 DIAGNOSIS — E119 Type 2 diabetes mellitus without complications: Secondary | ICD-10-CM

## 2020-07-02 NOTE — Patient Instructions (Signed)
Social Worker Visit Information  Goals we discussed today:  Goals Addressed              This Visit's Progress     Patient Stated   .  "I need hearing aids" (pt-stated)   On track     [place] (see longitudinal plan of care for additional care plan information)  Current Barriers:  . Financial constraints related to cost of hearing aids . Limited knowledge of health plan benefits . Barriers surrounding the self health management of DM II and HTN   Social Work Clinical Goal(s):  [name] Over the next 45 days the patient will work with SW to better understand health plan benefits pertaining to coverage of hearing aid costs  CCM SW Interventions: Completed 07/02/20 . Successful outbound call placed to the patient to assist with navigating health plan benefits related to obtaining a hearing aid . Collaborative call placed to the patients health plan to determine the patient hearing aid benefits o Benefit of a $3600 credit with a $0 co-pay amount every two years to obtain up to two devices every two years . Identified four providers within network close to the patient home o Patient has selected [facility] [phone] . Successful outbound call placed to [facility] located at [address] [place] to arrange an appointment o Appointment scheduled for the patient to see Dr. Karma Greaser on Tuesday August 24th at 1:00 pm. Patient advised to arrive by 12:45pm o Advised to contact the patients health plan portal [phone])  to arrange referral prior to appointment . Successful outbound call to patients health plan portal to give appointment information and obtain patient ID ([id number]) o Informed by representative the registration was completed and referral would be sent to authorize services at [facility] . Confirmed appointment address and time with the patient . Assessed for transportation barriers to upcoming appointment o The patient reports she  will try to arrange transportation via a friend o Advised the patient SW can assist with transportation arrangements via health plan benefit if needed . Scheduled follow up call over the next two weeks to remind the patient of her appointment and confirm transportation is arranged . Collaboration with embedded PharmD and RN Care Manager to inform of goal progression  Patient Self Care Activities:  . Self administers medications as prescribed . Calls pharmacy for medication refills . Performs ADL's independently . Calls provider office for new concerns or questions  Please see past updates related to this goal by clicking on the "Past Updates" button in the selected goal        Other   .  Collaborate with RN Care Manager and embedded PharmD to perform appropriate assessments to manage long-term care coordination needs        CARE PLAN ENTRY (see longitudinal plan of care for additional care plan information)  Current Barriers:  . Limited social support . Language barrier . Limited understanding of health plan benefits to address patient social determinants of health . Chronic conditions including DM II and HTN which put patient at increased risk of hospitalization  Social Work Clinical Goal(s):  [name] Over the next 6 months the patient will work with SW to develop a better understanding of patient health plan benefits   CCM SW Interventions: Completed 07/02/20 . Inter-disciplinary care team collaboration (see longitudinal plan of care) . Patient interviewed and appropriate assessments performed . Assisted the patient in contacting her health plan  to obtain benefit information . Determined the patient has [employer] Dual Complete with benefits including Healthy Food Program, OTC benefit, and transportation . Healthy Food Program o Patient receives $55 per month on a pre-loaded card to access healthy food options at the grocery store o Confirmed the patient currently has a balance of $55 to be  used by the end of August o Educated the patient on benefit and how to access while at the store o Encouraged the patient to access this benefit by the end of the month prior to losing funds for the month of August- the patient stated understanding . OTC Benefit o The patient is awarded $300 every quarter to be used on over the counter products listed in OTC Catalog o This benefit rolls over from month to month until the end of the plan year. Patient current balance of $550.83 with another $300 set to be awarded in October.  o Patient advised any credit not used by November 27 2020 will not roll over the 2022 plan year o Confirmed the patient does have an OTC Catalog in her possession o Advised the patient to look through the catalog to determine products she would like to order to use in her home o Encouraged the patient to order items she may not need at this time but could utilize in the future, such as incontinent supplies, in order to access her benefit prior to losing at the end of 2021- patient stated understanding . Transportation o Patient has access to transportation services for medical trips within a 91 mile radius of her home o Educated the patient how to access this benefit as well as the need to schedule within a 3 business day timeframe - patient stated understanding . Advised the patient SW will outreach over the next month to review benefits with the patient and assist with access as needed . Provided the patient with SW direct contact number  . Collaboration with Consulting civil engineer and embedded PharmD to inform of above interventions and plan for SW follow up  Patient Self Care Activities:  . Patient verbalizes understanding of plan to contact SW as needed to assist with benefit access . Calls pharmacy for medication refills . Performs ADL's independently . Performs IADL's independently  Initial goal documentation         Materials Provided: Verbal education about health  plan benefits provided by phone  Follow Up Plan: SW will follow up with patient by phone over the next two weeks.   Daneen Schick, BSW, CDP Social Worker, Certified Dementia Practitioner [place] [facility] [phone]

## 2020-07-02 NOTE — Chronic Care Management (AMB) (Signed)
Chronic Care Management    Social Work Follow Up Note  07/02/2020 Name: Demiah Gullickson MRN: [id number] DOB: September 08, 1941  Aminah Zabawa is a 79 y.o. year old female who is a primary care patient of [name], [place]. The CCM team was consulted for assistance with care coordination.   Review of patient status, including review of consultants reports, other relevant assessments, and collaboration with appropriate care team members and the patient's provider was performed as part of comprehensive patient evaluation and provision of chronic care management services.    SDOH (Social Determinants of Health) assessments performed: No    Outpatient Encounter Medications as of 07/02/2020  Medication Sig  . acetaminophen (TYLENOL) 500 MG tablet Take 1 tablet (500 mg total) by mouth every 6 (six) hours as needed for mild pain, moderate pain or headache. Reported on 05/10/2016 (Patient not taking: Reported on 05/27/2020)  . acetaminophen (TYLENOL) 650 MG CR tablet Take 1,300 mg by mouth in the morning and at bedtime.  . Alcohol Swabs (ALCOHOL WIPES) 70 % PADS by Does not apply route. Use with blood sugar check and injection of insulin (Patient not taking: Reported on 05/27/2020)  . blood glucose meter kit and supplies KIT Dispense based on patient and insurance preference. Use up to four times daily as directed. (FOR ICD-9 250.00, 250.01). (Patient not taking: Reported on 05/18/2020)  . Cholecalciferol (VITAMIN D3) 25 MCG (1000 UT) CAPS Take 1 capsule by mouth daily.  . clonazePAM (KLONOPIN) 0.5 MG tablet Take 0.5 mg by mouth at bedtime.   . metFORMIN (GLUCOPHAGE) 500 MG tablet TAKE 1 TABLET BY MOUTH 2 TIMES EVERY DAY WITH MORNING AND EVENING MEALS (Patient taking differently: Take 500 mg by mouth daily after lunch. )  . olmesartan (BENICAR) 20 MG tablet Take 1 tablet (20 mg total) by mouth daily.   No facility-administered encounter medications on file as of 07/02/2020.     Goals Addressed              This  Visit's Progress     Patient Stated   .  "I need hearing aids" (pt-stated)   On track     [place] (see longitudinal plan of care for additional care plan information)  Current Barriers:  . Financial constraints related to cost of hearing aids . Limited knowledge of health plan benefits . Barriers surrounding the self health management of DM II and HTN   Social Work Clinical Goal(s):  [name] Over the next 45 days the patient will work with SW to better understand health plan benefits pertaining to coverage of hearing aid costs  CCM SW Interventions: Completed 07/02/20 . Successful outbound call placed to the patient to assist with navigating health plan benefits related to obtaining a hearing aid . Collaborative call placed to the patients health plan to determine the patient hearing aid benefits o Benefit of a $3600 credit with a $0 co-pay amount every two years to obtain up to two devices every two years . Identified four providers within network close to the patient home o Patient has selected [facility] [phone] . Successful outbound call placed to [facility] located at [address] [place] to arrange an appointment o Appointment scheduled for the patient to see Dr. Karma Greaser on Tuesday August 24th at 1:00 pm. Patient advised to arrive by 12:45pm o Advised to contact the patients health plan portal [phone])  to arrange referral prior to appointment . Successful outbound call to patients  health plan portal to give appointment information and obtain patient ID ([id number]) o Informed by representative the registration was completed and referral would be sent to authorize services at [facility] . Confirmed appointment address and time with the patient . Assessed for transportation barriers to upcoming appointment o The patient reports she will try to arrange transportation via a friend o Advised the patient SW can assist with transportation  arrangements via health plan benefit if needed . Scheduled follow up call over the next two weeks to remind the patient of her appointment and confirm transportation is arranged . Collaboration with embedded PharmD and RN Care Manager to inform of goal progression  Patient Self Care Activities:  . Self administers medications as prescribed . Calls pharmacy for medication refills . Performs ADL's independently . Calls provider office for new concerns or questions  Please see past updates related to this goal by clicking on the "Past Updates" button in the selected goal        Other   .  Collaborate with RN Care Manager and embedded PharmD to perform appropriate assessments to manage long-term care coordination needs        CARE PLAN ENTRY (see longitudinal plan of care for additional care plan information)  Current Barriers:  . Limited social support . Language barrier . Limited understanding of health plan benefits to address patient social determinants of health . Chronic conditions including DM II and HTN which put patient at increased risk of hospitalization  Social Work Clinical Goal(s):  [name] Over the next 6 months the patient will work with SW to develop a better understanding of patient health plan benefits   CCM SW Interventions: Completed 07/02/20 . Inter-disciplinary care team collaboration (see longitudinal plan of care) . Patient interviewed and appropriate assessments performed . Assisted the patient in contacting her health plan to obtain benefit information . Determined the patient has [employer] Dual Complete with benefits including Healthy Food Program, OTC benefit, and transportation . Healthy Food Program o Patient receives $55 per month on a pre-loaded card to access healthy food options at the grocery store o Confirmed the patient currently has a balance of $55 to be used by the end of August o Educated the patient on benefit and how to access while at the  store o Encouraged the patient to access this benefit by the end of the month prior to losing funds for the month of August- the patient stated understanding . OTC Benefit o The patient is awarded $300 every quarter to be used on over the counter products listed in OTC Catalog o This benefit rolls over from month to month until the end of the plan year. Patient current balance of $550.83 with another $300 set to be awarded in October.  o Patient advised any credit not used by November 27 2020 will not roll over the 2022 plan year o Confirmed the patient does have an OTC Catalog in her possession o Advised the patient to look through the catalog to determine products she would like to order to use in her home o Encouraged the patient to order items she may not need at this time but could utilize in the future, such as incontinent supplies, in order to access her benefit prior to losing at the end of 2021- patient stated understanding . Transportation o Patient has access to transportation services for medical trips within a 50 mile radius of her home o Educated the patient how to access this benefit  as well as the need to schedule within a 3 business day timeframe - patient stated understanding . Advised the patient SW will outreach over the next month to review benefits with the patient and assist with access as needed . Provided the patient with SW direct contact number  . Collaboration with Consulting civil engineer and embedded PharmD to inform of above interventions and plan for SW follow up  Patient Self Care Activities:  . Patient verbalizes understanding of plan to contact SW as needed to assist with benefit access . Calls pharmacy for medication refills . Performs ADL's independently . Performs IADL's independently  Initial goal documentation         Follow Up Plan: SW will follow up with patient by phone over the next two weeks.   Daneen Schick, BSW, CDP Social Worker, Certified  Dementia Practitioner [place] [facility] [place] [facility] [phone]  Total time spent performing care coordination and/or care management activities with the patient by phone or face to face = 75 minutes.

## 2020-07-09 ENCOUNTER — Telehealth: Payer: Medicare Other

## 2020-07-09 ENCOUNTER — Other Ambulatory Visit: Payer: Self-pay

## 2020-07-09 ENCOUNTER — Ambulatory Visit: Payer: Self-pay

## 2020-07-09 DIAGNOSIS — E119 Type 2 diabetes mellitus without complications: Secondary | ICD-10-CM

## 2020-07-09 DIAGNOSIS — I10 Essential (primary) hypertension: Secondary | ICD-10-CM

## 2020-07-09 DIAGNOSIS — M25562 Pain in left knee: Secondary | ICD-10-CM

## 2020-07-09 DIAGNOSIS — G8929 Other chronic pain: Secondary | ICD-10-CM

## 2020-07-13 ENCOUNTER — Ambulatory Visit: Payer: Medicare Other

## 2020-07-13 DIAGNOSIS — I10 Essential (primary) hypertension: Secondary | ICD-10-CM

## 2020-07-13 DIAGNOSIS — E119 Type 2 diabetes mellitus without complications: Secondary | ICD-10-CM

## 2020-07-13 NOTE — Patient Instructions (Signed)
Visit Information  Goals Addressed      Patient Stated   .  "to get my BP under good control" (pt-stated)   Not on track     [place] (see longitudinal plan of care for additional care plan information)  Current Barriers:  [name] Knowledge Deficits related to disease process and Self Health management of Hypertension . Chronic Disease Management support and education needs related to DMII, HTN, Chronic Pain of both knees . Lacks caregiver support.  . Film/video editor.  . Difficulty obtaining medications  Nurse Case Manager Clinical Goal(s):  [name] Over the next 90 days, patient will work with the CCM team and PCP to address needs related to disease education and support to improve Self Health management of HTN  CCM RN CM Interventions:  07/09/20 call completed with patient  . Inter-disciplinary care team collaboration (see longitudinal plan of care) . Evaluation of current treatment plan related to HTN and patient's adherence to plan as established by provider. . Provided education to patient re: low Sodium dietary and exercise recommendations for improved management of HTN; Educated on target BP <130/80; Determined patient does have a BP cuff at home but gets anxious when she Self monitors and has a high reading; Discussed patient will bring her BP cuff at next appointment for a cuff check to ensure her cuff is properly working  . Reviewed medications with patient and discussed patient is adhering to taking her BP medications as directed w/o missed doses  . Discussed plans with patient for ongoing care management follow up and provided patient with direct contact information for care management team . Advised patient, providing education and rationale, to monitor blood pressure daily and record, calling the CCM team and or PCP for findings outside established parameters.  . Provided patient with printed educational materials related to What is High Blood Pressure?; Why Should I Lower  Sodium?  Patient Self Care Activities:  . Self administers medications as prescribed . Attends all scheduled provider appointments . Calls pharmacy for medication refills . Performs ADL's independently . Performs IADL's independently . Calls provider office for new concerns or questions  Initial goal documentation     .  "to keep my diabetes under good control" (pt-stated)   Not on track     [place] (see longitudinal plan of care for additional care plan information)  Current Barriers:  [name] Knowledge Deficits related to disease process and Self Health management of Diabetes  . Chronic Disease Management support and education needs related to DM II, HTN, 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 . Lacks caregiver support.  . Film/video editor.  . Difficulty obtaining medications  Nurse Case Manager Clinical Goal(s):  [name] Over the next 90 days, patient will work with the CCM team and PCP to address needs related to disease education and support to improve Self Health management of Diabetes  CCM RN CM Interventions:  07/09/20 call completed with patient  . Inter-disciplinary care team collaboration (see longitudinal plan of care) . Evaluation of current treatment plan related to Diabetes and patient's adherence to plan as established by provider. . Provided education to patient re: recent A1c 6.3 %; educated on target A1c and daily glycemic goal FBS 80-130, <180 after meals; Educated on 15'15' rule; Determined Ms. Kinchen is not currently checking her CBG's due to not being able to afford her glucose strips; Educated on diet and exercise recommendations . Reviewed medications with patient and discussed patient is adhering to taking her DM  meds as directed w/o missed doses . Collaborated with PCP [name], FNP regarding the need to send an Rx to patient's local pharmacy, "[facility]" due to [facility] is unable to file Medicare Part B for diabetic supplies . Discussed plans with  patient for ongoing care management follow up and provided patient with direct contact information for care management team . Provided patient with printed educational materials related to [employer] with Diabetes; Care Schedule for Diabetes; Preventing Complications with Diabetes  . Advised patient, providing education and rationale, to check cbg 1-2 times daily before meals and record, calling the CCM team and PCP for findings outside established parameters.    Patient Self Care Activities:  . Self administers medications as prescribed . Attends all scheduled provider appointments . Calls pharmacy for medication refills . Performs ADL's independently . Performs IADL's independently . Calls provider office for new concerns or questions  Initial goal documentation     .  COMPLETED: I would like to continue managing my diabetes and blood pressure (pt-stated)        Current Barriers:  . Language Barriers . Financial Barriers  Pharmacist Clinical Goal(s):  [name] Over the next 90 days, patient will work with PharmD and PCP  to address needs related to optimized medication management of chronic conditions  Interventions: Call completed with patient on 10/15/19 . Comprehensive medication review performed.  No changes have been made to medications.  Patient denies adverse events. . Advised patient to continue taking medications as prescribed and checking BGs every morning as directed by PCP. [name] Patient set up for continued pharmacy delivery through [facility]) . Patient continues to check BG most every morning.  It ranges from 80-100 depending on her diet. Last A1c was 6.6% on 08/06/2019.  [name] She states her blood pressure has also been at goal <130/80.  She denies BP issues.   . She states she needs a new hearing aid and that her hearing is th main problem.  Unfortunately, there are no resources to assist with this.  Patient has been complaining of tinnitus chronically.   Patient's current medications reviewed for tinnitus risk.  Angiotensin receptor blockers (ARBs-pt on olmesartan) --low tinnitus risk.  All of patient's other medications are unlikely to cause tinnitus as well. . Discussed plans with patient for ongoing care management follow up and provided patient with direct contact information for care management team . Discussed patient case with CCM team who will be reaching out to patient in the next month  Patient Self Care Activities:  . Self administers medications as prescribed . Attends all scheduled provider appointments . Calls pharmacy for medication refills  Please see past updates related to this goal by clicking on the "Past Updates" button in the selected goal        Other   .  COMPLETED: Assist with Disease Management and Care Coordination        Current Barriers:  [name] Knowledge Barriers related to resources and support available to address needs related to chronic disease management and community resources.  Case Manager Clinical Goal(s):  [name] Over the next 30 days, patient will work with the CCM team to address needs related to [employer], Chronic Disease Self Health Management, and improved medication management.   Interventions:  . Collaborated with BSW and initiated plan of care to address needs related to community resources and referral to embedded Pharm D.   Patient Self Care Activities:  . Self administers medications as prescribed .  Performs ADL's independently . Calls provider office for new concerns or questions  Initial goal documentation     .  Collaborate with RN Care Manager and embedded PharmD to perform appropriate assessments to manage long-term care coordination needs        CARE PLAN ENTRY (see longitudinal plan of care for additional care plan information)  Current Barriers:  . Limited social support . Language barrier . Limited understanding of health plan benefits to address patient social  determinants of health . Chronic conditions including DM II and HTN which put patient at increased risk of hospitalization  Social Work Clinical Goal(s):  [name] Over the next 6 months the patient will work with SW to develop a better understanding of patient health plan benefits   CCM SW Interventions: Completed 07/02/20 . Inter-disciplinary care team collaboration (see longitudinal plan of care) . Patient interviewed and appropriate assessments performed . Assisted the patient in contacting her health plan to obtain benefit information . Determined the patient has UHC Dual Complete with benefits including Healthy Food Program, OTC benefit, and transportation . Healthy Food Program o Patient receives $55 per month on a pre-loaded card to access healthy food options at the grocery store o Confirmed the patient currently has a balance of $55 to be used by the end of August o Educated the patient on benefit and how to access while at the store o Encouraged the patient to access this benefit by the end of the month prior to losing funds for the month of August- the patient stated understanding . OTC Benefit o The patient is awarded $300 every quarter to be used on over the counter products listed in OTC Catalog o This benefit rolls over from month to month until the end of the plan year. Patient current balance of $550.83 with another $300 set to be awarded in October.  o Patient advised any credit not used by November 27 2020 will not roll over the 2022 plan year o Confirmed the patient does have an OTC Catalog in her possession o Advised the patient to look through the catalog to determine products she would like to order to use in her home o Encouraged the patient to order items she may not need at this time but could utilize in the future, such as incontinent supplies, in order to access her benefit prior to losing at the end of 2021- patient stated understanding . Transportation o Patient has  access to transportation services for medical trips within a 5 mile radius of her home o Educated the patient how to access this benefit as well as the need to schedule within a 3 business day timeframe - patient stated understanding . Advised the patient SW will outreach over the next month to review benefits with the patient and assist with access as needed . Provided the patient with SW direct contact number  . Collaboration with Consulting civil engineer and embedded PharmD to inform of above interventions and plan for SW follow up  CCM RN CM Interventions:  07/09/20 call completed with patient  . Determined patient is running low on food each month prior to getting her food stamps on the 15 th of each month . Determined she is also experiencing hardship paying her utility bills due to having limited income  . Collaborated with embedded BSW with notification of patient's concerns and need for further assistance with resources for food and cost of utilities . Discussed plans with patient for ongoing care management follow  up and provided patient with direct contact information for care management team  Patient Self Care Activities:  . Patient verbalizes understanding of plan to contact SW as needed to assist with benefit access . Calls pharmacy for medication refills . Performs ADL's independently . Performs IADL's independently  Initial goal documentation      Patient verbalizes understanding of instructions provided today.   Telephone follow up appointment with care management team member scheduled for: 09/18/20  Barb Merino, RN, BSN, CCM Care Management Coordinator [place] [facility]  Direct Phone: [phone]

## 2020-07-13 NOTE — Chronic Care Management (AMB) (Signed)
Chronic Care Management    Social Work Follow Up Note  07/13/2020 Name: Allison Franco MRN: [id number] DOB: 06/20/1941  Allison Franco is a 79 y.o. year old female who is a primary care patient of [name], [place]. The CCM team was consulted for assistance with care coordination.   Review of patient status, including review of consultants reports, other relevant assessments, and collaboration with appropriate care team members and the patient's provider was performed as part of comprehensive patient evaluation and provision of chronic care management services.    SDOH (Social Determinants of Health) assessments performed: No    Outpatient Encounter Medications as of 07/13/2020  Medication Sig  . acetaminophen (TYLENOL) 500 MG tablet Take 1 tablet (500 mg total) by mouth every 6 (six) hours as needed for mild pain, moderate pain or headache. Reported on 05/10/2016 (Patient not taking: Reported on 05/27/2020)  . acetaminophen (TYLENOL) 650 MG CR tablet Take 1,300 mg by mouth in the morning and at bedtime.  . Alcohol Swabs (ALCOHOL WIPES) 70 % PADS by Does not apply route. Use with blood sugar check and injection of insulin (Patient not taking: Reported on 05/27/2020)  . blood glucose meter kit and supplies KIT Dispense based on patient and insurance preference. Use up to four times daily as directed. (FOR ICD-9 250.00, 250.01). (Patient not taking: Reported on 05/18/2020)  . Cholecalciferol (VITAMIN D3) 25 MCG (1000 UT) CAPS Take 1 capsule by mouth daily.  . clonazePAM (KLONOPIN) 0.5 MG tablet Take 0.5 mg by mouth at bedtime.   . metFORMIN (GLUCOPHAGE) 500 MG tablet TAKE 1 TABLET BY MOUTH 2 TIMES EVERY DAY WITH MORNING AND EVENING MEALS (Patient taking differently: Take 500 mg by mouth daily after lunch. )  . olmesartan (BENICAR) 20 MG tablet Take 1 tablet (20 mg total) by mouth daily.   No facility-administered encounter medications on file as of 07/13/2020.     Goals Addressed               This Visit's Progress     Patient Stated   .  "I need hearing aids" (pt-stated)   On track     [place] (see longitudinal plan of care for additional care plan information)  Current Barriers:  . Financial constraints related to cost of hearing aids . Limited knowledge of health plan benefits . Barriers surrounding the self health management of DM II and HTN   Social Work Clinical Goal(s):  [name] Over the next 45 days the patient will work with SW to better understand health plan benefits pertaining to coverage of hearing aid costs  CCM SW Interventions: Completed 07/13/20 . Successful outbound call placed to the patient to assess transportation needs for upcoming appointment . Confirmed the patient has secured transportation and does not need SW intervention for 8/24 appointment . SW will follow up with the patient over the next two weeks   Completed 07/02/20 . Successful outbound call placed to the patient to assist with navigating health plan benefits related to obtaining a hearing aid . Collaborative call placed to the patients health plan to determine the patient hearing aid benefits o Benefit of a $3600 credit with a $0 co-pay amount every two years to obtain up to two devices every two years . Identified four providers within network close to the patient home o Patient has selected [facility] [phone] . Successful outbound call placed to [facility] located at [address] [place] to arrange an appointment  o Appointment scheduled for the patient to see Dr. Karma Greaser on Tuesday August 24th at 1:00 pm. Patient advised to arrive by 12:45pm o Advised to contact the patients health plan portal [phone])  to arrange referral prior to appointment . Successful outbound call to patients health plan portal to give appointment information and obtain patient ID ([id number]) o Informed by representative the registration was completed and referral would be  sent to authorize services at [facility] . Confirmed appointment address and time with the patient . Assessed for transportation barriers to upcoming appointment o The patient reports she will try to arrange transportation via a friend o Advised the patient SW can assist with transportation arrangements via health plan benefit if needed . Scheduled follow up call over the next two weeks to remind the patient of her appointment and confirm transportation is arranged . Collaboration with embedded PharmD and RN Care Manager to inform of goal progression  Patient Self Care Activities:  . Self administers medications as prescribed . Calls pharmacy for medication refills . Performs ADL's independently . Calls provider office for new concerns or questions  Please see past updates related to this goal by clicking on the "Past Updates" button in the selected goal          Follow Up Plan: SW will follow up with patient by phone over the next two weeks.   Daneen Schick, BSW, CDP Social Worker, Certified Dementia Practitioner [place] [facility] [place] [facility] [phone]  Total time spent performing care coordination and/or care management activities with the patient by phone or face to face = 15 minutes.

## 2020-07-13 NOTE — Patient Instructions (Signed)
Social Worker Visit Information  Goals we discussed today:  Goals Addressed              This Visit's Progress     Patient Stated   .  "I need hearing aids" (pt-stated)   On track     [place] (see longitudinal plan of care for additional care plan information)  Current Barriers:  . Financial constraints related to cost of hearing aids . Limited knowledge of health plan benefits . Barriers surrounding the self health management of DM II and HTN   Social Work Clinical Goal(s):  [name] Over the next 45 days the patient will work with SW to better understand health plan benefits pertaining to coverage of hearing aid costs  CCM SW Interventions: Completed 07/13/20 . Successful outbound call placed to the patient to assess transportation needs for upcoming appointment . Confirmed the patient has secured transportation and does not need SW intervention for 8/24 appointment . SW will follow up with the patient over the next two weeks   Completed 07/02/20 . Successful outbound call placed to the patient to assist with navigating health plan benefits related to obtaining a hearing aid . Collaborative call placed to the patients health plan to determine the patient hearing aid benefits o Benefit of a $3600 credit with a $0 co-pay amount every two years to obtain up to two devices every two years . Identified four providers within network close to the patient home o Patient has selected [facility] [phone] . Successful outbound call placed to [facility] located at [address] [place] to arrange an appointment o Appointment scheduled for the patient to see Dr. Karma Greaser on Tuesday August 24th at 1:00 pm. Patient advised to arrive by 12:45pm o Advised to contact the patients health plan portal [phone])  to arrange referral prior to appointment . Successful outbound call to patients health plan portal to give appointment information and obtain patient  ID ([id number]) o Informed by representative the registration was completed and referral would be sent to authorize services at [facility] . Confirmed appointment address and time with the patient . Assessed for transportation barriers to upcoming appointment o The patient reports she will try to arrange transportation via a friend o Advised the patient SW can assist with transportation arrangements via health plan benefit if needed . Scheduled follow up call over the next two weeks to remind the patient of her appointment and confirm transportation is arranged . Collaboration with embedded PharmD and RN Care Manager to inform of goal progression  Patient Self Care Activities:  . Self administers medications as prescribed . Calls pharmacy for medication refills . Performs ADL's independently . Calls provider office for new concerns or questions  Please see past updates related to this goal by clicking on the "Past Updates" button in the selected goal          Follow Up Plan: SW will follow up with patient by phone over the next two weeks.   Daneen Schick, BSW, CDP Social Worker, Certified Dementia Practitioner [place] [facility] [place] [facility] [phone]

## 2020-07-13 NOTE — Chronic Care Management (AMB) (Signed)
Chronic Care Management   Follow Up Note   07/09/2020 Name: Allison Franco MRN: [id number] DOB: [birth year]  Referred by: [name], FNP Reason for referral : Chronic Care Management (FU RN CM Call )   Allison Franco is a 79 y.o. year old female who is a primary care patient of [name], [place]. The CCM team was consulted for assistance with chronic disease management and care coordination needs.    Review of patient status, including review of consultants reports, relevant laboratory and other test results, and collaboration with appropriate care team members and the patient's provider was performed as part of comprehensive patient evaluation and provision of chronic care management services.    SDOH (Social Determinants of Health) assessments performed: Yes - resources needed for food and cost of utilities See Care Plan activities for detailed interventions related to [place])   Placed outbound CCM RN CM follow up call to patient for a care plan update.     Outpatient Encounter Medications as of 07/09/2020  Medication Sig  . acetaminophen (TYLENOL) 500 MG tablet Take 1 tablet (500 mg total) by mouth every 6 (six) hours as needed for mild pain, moderate pain or headache. Reported on 05/10/2016 (Patient not taking: Reported on 05/27/2020)  . acetaminophen (TYLENOL) 650 MG CR tablet Take 1,300 mg by mouth in the morning and at bedtime.  . Alcohol Swabs (ALCOHOL WIPES) 70 % PADS by Does not apply route. Use with blood sugar check and injection of insulin (Patient not taking: Reported on 05/27/2020)  . blood glucose meter kit and supplies KIT Dispense based on patient and insurance preference. Use up to four times daily as directed. (FOR ICD-9 250.00, 250.01). (Patient not taking: Reported on 05/18/2020)  . Cholecalciferol (VITAMIN D3) 25 MCG (1000 UT) CAPS Take 1 capsule by mouth daily.  . clonazePAM (KLONOPIN) 0.5 MG tablet Take 0.5 mg by mouth at bedtime.   . metFORMIN (GLUCOPHAGE) 500 MG tablet  TAKE 1 TABLET BY MOUTH 2 TIMES EVERY DAY WITH MORNING AND EVENING MEALS (Patient taking differently: Take 500 mg by mouth daily after lunch. )  . olmesartan (BENICAR) 20 MG tablet Take 1 tablet (20 mg total) by mouth daily.   No facility-administered encounter medications on file as of 07/09/2020.     Objective:  Lab Results  Component Value Date   HGBA1C 6.3 (H) 05/18/2020   HGBA1C 6.4 (H) 01/15/2020   HGBA1C 6.6 (H) 08/06/2019   Lab Results  Component Value Date   LDLCALC 99 01/15/2020   CREATININE 0.86 05/18/2020   BP Readings from Last 3 Encounters:  05/18/20 136/86  04/05/20 131/73  01/15/20 126/84    Goals Addressed      Patient Stated   .  "to get my BP under good control" (pt-stated)   Not on track     [place] (see longitudinal plan of care for additional care plan information)  Current Barriers:  [name] Knowledge Deficits related to disease process and Self Health management of Hypertension . Chronic Disease Management support and education needs related to DMII, HTN, Chronic Pain of both knees . Lacks caregiver support.  . Film/video editor.  . Difficulty obtaining medications  Nurse Case Manager Clinical Goal(s):  [name] Over the next 90 days, patient will work with the CCM team and PCP to address needs related to disease education and support to improve Self Health management of HTN  CCM RN CM Interventions:  07/09/20 call completed with patient  . Inter-disciplinary care team collaboration (  see longitudinal plan of care) . Evaluation of current treatment plan related to HTN and patient's adherence to plan as established by provider. . Provided education to patient re: low Sodium dietary and exercise recommendations for improved management of HTN; Educated on target BP <130/80; Determined patient does have a BP cuff at home but gets anxious when she Self monitors and has a high reading; Discussed patient will bring her BP cuff at next appointment for a cuff  check to ensure her cuff is properly working  . Reviewed medications with patient and discussed patient is adhering to taking her BP medications as directed w/o missed doses  . Discussed plans with patient for ongoing care management follow up and provided patient with direct contact information for care management team . Advised patient, providing education and rationale, to monitor blood pressure daily and record, calling the CCM team and or PCP for findings outside established parameters.  . Provided patient with printed educational materials related to What is High Blood Pressure?; Why Should I Lower Sodium?  Patient Self Care Activities:  . Self administers medications as prescribed . Attends all scheduled provider appointments . Calls pharmacy for medication refills . Performs ADL's independently . Performs IADL's independently . Calls provider office for new concerns or questions  Initial goal documentation     .  "to keep my diabetes under good control" (pt-stated)   Not on track     [place] (see longitudinal plan of care for additional care plan information)  Current Barriers:  [name] Knowledge Deficits related to disease process and Self Health management of Diabetes  . Chronic Disease Management support and education needs related to DM II, HTN, 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 . Lacks caregiver support.  . Film/video editor.  . Difficulty obtaining medications  Nurse Case Manager Clinical Goal(s):  [name] Over the next 90 days, patient will work with the CCM team and PCP to address needs related to disease education and support to improve Self Health management of Diabetes  CCM RN CM Interventions:  07/09/20 call completed with patient  . Inter-disciplinary care team collaboration (see longitudinal plan of care) . Evaluation of current treatment plan related to Diabetes and patient's adherence to plan as established by provider. . Provided education to patient re: recent  A1c 6.3 %; educated on target A1c and daily glycemic goal FBS 80-130, <180 after meals; Educated on 15'15' rule; Determined Allison Franco is not currently checking her CBG's due to not being able to afford her glucose strips; Educated on diet and exercise recommendations . Reviewed medications with patient and discussed patient is adhering to taking her DM meds as directed w/o missed doses . Collaborated with PCP [name], FNP regarding the need to send an Rx to patient's local pharmacy, "[facility]" due to [facility] is unable to file Medicare Part B for diabetic supplies . Discussed plans with patient for ongoing care management follow up and provided patient with direct contact information for care management team . Provided patient with printed educational materials related to [employer] with Diabetes; Care Schedule for Diabetes; Preventing Complications with Diabetes  . Advised patient, providing education and rationale, to check cbg 1-2 times daily before meals and record, calling the CCM team and PCP for findings outside established parameters.    Patient Self Care Activities:  . Self administers medications as prescribed . Attends all scheduled provider appointments . Calls pharmacy for medication refills . Performs ADL's independently . Performs IADL's independently . Calls  provider office for new concerns or questions  Initial goal documentation     .  COMPLETED: I would like to continue managing my diabetes and blood pressure (pt-stated)        Current Barriers:  . Language Barriers . Financial Barriers  Pharmacist Clinical Goal(s):  [name] Over the next 90 days, patient will work with PharmD and PCP  to address needs related to optimized medication management of chronic conditions  Interventions: Call completed with patient on 10/15/19 . Comprehensive medication review performed.  No changes have been made to medications.  Patient denies adverse events. . Advised  patient to continue taking medications as prescribed and checking BGs every morning as directed by PCP. [name] Patient set up for continued pharmacy delivery through [facility]) . Patient continues to check BG most every morning.  It ranges from 80-100 depending on her diet. Last A1c was 6.6% on 08/06/2019.  [name] She states her blood pressure has also been at goal <130/80.  She denies BP issues.   . She states she needs a new hearing aid and that her hearing is th main problem.  Unfortunately, there are no resources to assist with this.  Patient has been complaining of tinnitus chronically.  Patient's current medications reviewed for tinnitus risk.  Angiotensin receptor blockers (ARBs-pt on olmesartan) --low tinnitus risk.  All of patient's other medications are unlikely to cause tinnitus as well. . Discussed plans with patient for ongoing care management follow up and provided patient with direct contact information for care management team . Discussed patient case with CCM team who will be reaching out to patient in the next month  Patient Self Care Activities:  . Self administers medications as prescribed . Attends all scheduled provider appointments . Calls pharmacy for medication refills  Please see past updates related to this goal by clicking on the "Past Updates" button in the selected goal        Other   .  COMPLETED: Assist with Disease Management and Care Coordination        Current Barriers:  [name] Knowledge Barriers related to resources and support available to address needs related to chronic disease management and community resources.  Case Manager Clinical Goal(s):  [name] Over the next 30 days, patient will work with the CCM team to address needs related to [employer], Chronic Disease Self Health Management, and improved medication management.   Interventions:  . Collaborated with BSW and initiated plan of care to address needs related to community resources and  referral to embedded Pharm D.   Patient Self Care Activities:  . Self administers medications as prescribed . Performs ADL's independently . Calls provider office for new concerns or questions  Initial goal documentation     .  Collaborate with RN Care Manager and embedded PharmD to perform appropriate assessments to manage long-term care coordination needs        CARE PLAN ENTRY (see longitudinal plan of care for additional care plan information)  Current Barriers:  . Limited social support . Language barrier . Limited understanding of health plan benefits to address patient social determinants of health . Chronic conditions including DM II and HTN which put patient at increased risk of hospitalization  Social Work Clinical Goal(s):  [name] Over the next 6 months the patient will work with SW to develop a better understanding of patient health plan benefits   CCM SW Interventions: Completed 07/02/20 . Inter-disciplinary care team collaboration (see longitudinal plan of care) .  Patient interviewed and appropriate assessments performed . Assisted the patient in contacting her health plan to obtain benefit information . Determined the patient has UHC Dual Complete with benefits including Healthy Food Program, OTC benefit, and transportation . Healthy Food Program o Patient receives $55 per month on a pre-loaded card to access healthy food options at the grocery store o Confirmed the patient currently has a balance of $55 to be used by the end of August o Educated the patient on benefit and how to access while at the store o Encouraged the patient to access this benefit by the end of the month prior to losing funds for the month of August- the patient stated understanding . OTC Benefit o The patient is awarded $300 every quarter to be used on over the counter products listed in OTC Catalog o This benefit rolls over from month to month until the end of the plan year. Patient current balance  of $550.83 with another $300 set to be awarded in October.  o Patient advised any credit not used by November 27 2020 will not roll over the 2022 plan year o Confirmed the patient does have an OTC Catalog in her possession o Advised the patient to look through the catalog to determine products she would like to order to use in her home o Encouraged the patient to order items she may not need at this time but could utilize in the future, such as incontinent supplies, in order to access her benefit prior to losing at the end of 2021- patient stated understanding . Transportation o Patient has access to transportation services for medical trips within a 30 mile radius of her home o Educated the patient how to access this benefit as well as the need to schedule within a 3 business day timeframe - patient stated understanding . Advised the patient SW will outreach over the next month to review benefits with the patient and assist with access as needed . Provided the patient with SW direct contact number  . Collaboration with Consulting civil engineer and embedded PharmD to inform of above interventions and plan for SW follow up  CCM RN CM Interventions:  07/09/20 call completed with patient  . Determined patient is running low on food each month prior to getting her food stamps on the 15 th of each month . Determined she is also experiencing hardship paying her utility bills due to having limited income  . Collaborated with embedded BSW with notification of patient's concerns and need for further assistance with resources for food and cost of utilities . Discussed plans with patient for ongoing care management follow up and provided patient with direct contact information for care management team  Patient Self Care Activities:  . Patient verbalizes understanding of plan to contact SW as needed to assist with benefit access . Calls pharmacy for medication refills . Performs ADL's independently . Performs  IADL's independently  Initial goal documentation        Plan:   Telephone follow up appointment with care management team member scheduled for: 09/18/20  Barb Merino, RN, BSN, CCM Care Management Coordinator [facility]  Direct Phone: [phone]

## 2020-07-14 ENCOUNTER — Other Ambulatory Visit: Payer: Self-pay

## 2020-07-14 MED ORDER — GLUCOSE BLOOD VI STRP: ORAL_STRIP | 12 refills | Status: DC

## 2020-07-15 DIAGNOSIS — H353111 Nonexudative age-related macular degeneration, right eye, early dry stage: Secondary | ICD-10-CM | POA: Diagnosis not present

## 2020-07-15 DIAGNOSIS — H2513 Age-related nuclear cataract, bilateral: Secondary | ICD-10-CM | POA: Diagnosis not present

## 2020-07-15 DIAGNOSIS — H401123 Primary open-angle glaucoma, left eye, severe stage: Secondary | ICD-10-CM | POA: Diagnosis not present

## 2020-07-15 DIAGNOSIS — H35373 Puckering of macula, bilateral: Secondary | ICD-10-CM | POA: Diagnosis not present

## 2020-07-15 DIAGNOSIS — H401111 Primary open-angle glaucoma, right eye, mild stage: Secondary | ICD-10-CM | POA: Diagnosis not present

## 2020-07-15 LAB — HM DIABETES EYE EXAM

## 2020-07-16 ENCOUNTER — Encounter: Payer: Self-pay | Admitting: Nurse Practitioner

## 2020-07-16 DIAGNOSIS — H401133 Primary open-angle glaucoma, bilateral, severe stage: Secondary | ICD-10-CM | POA: Insufficient documentation

## 2020-07-21 DIAGNOSIS — H905 Unspecified sensorineural hearing loss: Secondary | ICD-10-CM | POA: Diagnosis not present

## 2020-07-22 ENCOUNTER — Other Ambulatory Visit: Payer: Self-pay | Admitting: Nurse Practitioner

## 2020-07-22 MED ORDER — DICLOFENAC SODIUM 1 % EX GEL
2.0000 g | Freq: Four times a day (QID) | CUTANEOUS | 2 refills | Status: DC
Start: 2020-07-22 — End: 2021-07-20

## 2020-07-27 ENCOUNTER — Ambulatory Visit: Payer: Medicare Other

## 2020-07-27 DIAGNOSIS — E119 Type 2 diabetes mellitus without complications: Secondary | ICD-10-CM

## 2020-07-27 DIAGNOSIS — I10 Essential (primary) hypertension: Secondary | ICD-10-CM | POA: Diagnosis not present

## 2020-07-27 NOTE — Chronic Care Management (AMB) (Signed)
Chronic Care Management    Social Work Follow Up Note  07/27/2020 Name: Allison Franco MRN: [id number] DOB: May 29, 1941  Allison Franco is a 79 y.o. year old female who is a primary care patient of [name], [place]. The CCM team was consulted for assistance with care coordination.   Review of patient status, including review of consultants reports, other relevant assessments, and collaboration with appropriate care team members and the patient's provider was performed as part of comprehensive patient evaluation and provision of chronic care management services.    SDOH (Social Determinants of Health) assessments performed: No    Outpatient Encounter Medications as of 07/27/2020  Medication Sig   acetaminophen (TYLENOL) 500 MG tablet Take 1 tablet (500 mg total) by mouth every 6 (six) hours as needed for mild pain, moderate pain or headache. Reported on 05/10/2016 (Patient not taking: Reported on 05/27/2020)   acetaminophen (TYLENOL) 650 MG CR tablet Take 1,300 mg by mouth in the morning and at bedtime.   Alcohol Swabs (ALCOHOL WIPES) 70 % PADS by Does not apply route. Use with blood sugar check and injection of insulin (Patient not taking: Reported on 05/27/2020)   blood glucose meter kit and supplies KIT Dispense based on patient and insurance preference. Use up to four times daily as directed. (FOR ICD-9 250.00, 250.01). (Patient not taking: Reported on 05/18/2020)   Cholecalciferol (VITAMIN D3) 25 MCG (1000 UT) CAPS Take 1 capsule by mouth daily.   clonazePAM (KLONOPIN) 0.5 MG tablet Take 0.5 mg by mouth at bedtime.    diclofenac Sodium (VOLTAREN) 1 % GEL Apply 2 g topically 4 (four) times daily.   glucose blood test strip Use as instructed to check blood sugars 3 times a day. Dx code: e11.65   metFORMIN (GLUCOPHAGE) 500 MG tablet TAKE 1 TABLET BY MOUTH 2 TIMES EVERY DAY WITH MORNING AND EVENING MEALS (Patient taking differently: Take 500 mg by mouth daily after lunch. )   olmesartan  (BENICAR) 20 MG tablet Take 1 tablet (20 mg total) by mouth daily.   No facility-administered encounter medications on file as of 07/27/2020.     Goals Addressed              This Visit's Progress     Patient Stated     "I need hearing aids" (pt-stated)   On track     [place] (see longitudinal plan of care for additional care plan information)  Current Barriers:   Financial constraints related to cost of hearing aids  Limited knowledge of health plan benefits  Barriers surrounding the self health management of DM II and HTN   Social Work Clinical Goal(s):   Over the next 45 days the patient will work with SW to better understand health plan benefits pertaining to coverage of hearing aid costs  CCM SW Interventions: Completed 07/27/20  Successful outbound call placed to the patient to assess goal progression  Determined the patient did successfully attend appointment on 8/24 to have hearing test completed o Patient has scheduled appointment September 2nd to follow up and receive hearing aids  Discussed the patient has felt ringing in her ears which she has contacted [facility] about  Encouraged the patient to continue accessing [facility] as needed for in home services to avoid ED visits  Scheduled follow up call over the next two weeks to assess outcome of follow up appointment and confirm hearing aids received  Completed 07/13/20  Successful outbound call placed to the patient to assess  transportation needs for upcoming appointment  Confirmed the patient has secured transportation and does not need SW intervention for 8/24 appointment  SW will follow up with the patient over the next two weeks   Completed 07/02/20  Successful outbound call placed to the patient to assist with navigating health plan benefits related to obtaining a hearing aid  Collaborative call placed to the patients health plan to determine the patient hearing aid  benefits o Benefit of a $3600 credit with a $0 co-pay amount every two years to obtain up to two devices every two years  Identified four providers within network close to the patient home o Patient has selected [facility] [phone]  Successful outbound call placed to [facility] located at [address] [place] to arrange an appointment o Appointment scheduled for the patient to see Dr. Karma Greaser on Tuesday August 24th at 1:00 pm. Patient advised to arrive by 12:45pm o Advised to contact the patients health plan portal [phone])  to arrange referral prior to appointment  Successful outbound call to patients health plan portal to give appointment information and obtain patient ID ([id number]) o Informed by representative the registration was completed and referral would be sent to authorize services at [facility]  Confirmed appointment address and time with the patient  Assessed for transportation barriers to upcoming appointment o The patient reports she will try to arrange transportation via a friend o Advised the patient SW can assist with transportation arrangements via health plan benefit if needed  Scheduled follow up call over the next two weeks to remind the patient of her appointment and confirm transportation is arranged  Collaboration with embedded PharmD and RN Care Manager to inform of goal progression  Patient Self Care Activities:   Self administers medications as prescribed  Calls pharmacy for medication refills  Performs ADL's independently  Calls provider office for new concerns or questions  Please see past updates related to this goal by clicking on the "Past Updates" button in the selected goal        Other     Collaborate with RN Care Manager and embedded PharmD to perform appropriate assessments to manage long-term care coordination needs        CARE PLAN ENTRY (see longitudinal plan of care for additional care  plan information)  Current Barriers:   Limited social support  Language barrier  Limited understanding of health plan benefits to address patient social determinants of health  Chronic conditions including DM II and HTN which put patient at increased risk of hospitalization  Social Work Clinical Goal(s):   Over the next 6 months the patient will work with SW to develop a better understanding of patient health plan benefits   CCM SW Interventions: Completed 07/27/20  Successful outbound call placed to the patient to discuss food insecurity   Patient reports she does have benefits under food and nutrition services that help supplement food needs  Discussed patient "Healthy Food" benefit offered under [facility] health plan benefit o Patient reports she has tried to access this benefit in the past but the card was always declined o Determined the patient has contacted [facility] to request a new card be mailed to her home  Scheduled follow up call over the next two weeks to confirm receipt of new card  Completed 07/02/20  Inter-disciplinary care team collaboration (see longitudinal plan of care)  Patient interviewed and appropriate assessments performed  Assisted the patient in contacting her  health plan to obtain benefit information  Determined the patient has [facility] Dual Complete with benefits including Healthy Food Program, OTC benefit, and transportation  [place] o Patient receives $55 per month on a pre-loaded card to access healthy food options at the grocery store o Confirmed the patient currently has a balance of $55 to be used by the end of August o Educated the patient on benefit and how to access while at the store o Encouraged the patient to access this benefit by the end of the month prior to losing funds for the month of August- the patient stated understanding  OTC Benefit o The patient is awarded $300 every quarter to be used on over the counter products listed in  OTC Catalog o This benefit rolls over from month to month until the end of the plan year. Patient current balance of $550.83 with another $300 set to be awarded in October.  o Patient advised any credit not used by November 27 2020 will not roll over the 2022 plan year o Confirmed the patient does have an OTC Catalog in her possession o Advised the patient to look through the catalog to determine products she would like to order to use in her home o Encouraged the patient to order items she may not need at this time but could utilize in the future, such as incontinent supplies, in order to access her benefit prior to losing at the end of 2021- patient stated understanding  Transportation o Patient has access to transportation services for medical trips within a 50 mile radius of her home o Educated the patient how to access this benefit as well as the need to schedule within a 3 business day timeframe - patient stated understanding  Advised the patient SW will outreach over the next month to review benefits with the patient and assist with access as needed  Provided the patient with SW direct contact number   Collaboration with Consulting civil engineer and embedded PharmD to inform of above interventions and plan for SW follow up  CCM RN CM Interventions:  07/09/20 call completed with patient   Determined patient is running low on food each month prior to getting her food stamps on the 15 th of each month  Determined she is also experiencing hardship paying her utility bills due to having limited income   Collaborated with embedded BSW with notification of patient's concerns and need for further assistance with resources for food and cost of utilities  Discussed plans with patient for ongoing care management follow up and provided patient with direct contact information for care management team  Patient Self Care Activities:   Patient verbalizes understanding of plan to contact SW as needed to  assist with benefit access  Calls pharmacy for medication refills  Performs ADL's independently  Performs IADL's independently  Please see past updates related to this goal by clicking on the "Past Updates" button in the selected goal          Follow Up Plan: SW will follow up with patient by phone over the next two weeks.   Daneen Schick, BSW, CDP Social Worker, Certified Dementia Practitioner [place] [facility] [place] [facility] [phone]  Total time spent performing care coordination and/or care management activities with the patient by phone or face to face = 17 minutes.

## 2020-07-27 NOTE — Patient Instructions (Signed)
Social Worker Visit Information  Goals we discussed today:  Goals Addressed              This Visit's Progress     Patient Stated   .  "I need hearing aids" (pt-stated)   On track     Allison Franco (see longitudinal plan of care for additional care plan information)  Current Barriers:  . Financial constraints related to cost of hearing aids . Limited knowledge of health plan benefits . Barriers surrounding the self health management of DM II and HTN   Social Work Clinical Goal(s):  [name] Over the next 45 days the patient will work with SW to better understand health plan benefits pertaining to coverage of hearing aid costs  CCM SW Interventions: Completed 07/27/20 . Successful outbound call placed to the patient to assess goal progression . Determined the patient did successfully attend appointment on 8/24 to have hearing test completed o Patient has scheduled appointment September 2nd to follow up and receive hearing aids . Discussed the patient has felt ringing in her ears which she has contacted [facility] about . Encouraged the patient to continue accessing [facility] as needed for in home services to avoid ED visits . Scheduled follow up call over the next two weeks to assess outcome of follow up appointment and confirm hearing aids received  Completed 07/13/20 . Successful outbound call placed to the patient to assess transportation needs for upcoming appointment . Confirmed the patient has secured transportation and does not need SW intervention for 8/24 appointment . SW will follow up with the patient over the next two weeks   Completed 07/02/20 . Successful outbound call placed to the patient to assist with navigating health plan benefits related to obtaining a hearing aid . Collaborative call placed to the patients health plan to determine the patient hearing aid benefits o Benefit of a $3600 credit with a $0 co-pay amount every two years to obtain up to two devices  every two years . Identified four providers within network close to the patient home o Patient has selected [facility] [phone] . Successful outbound call placed to [facility] located at [address] [place] to arrange an appointment o Appointment scheduled for the patient to see Dr. Karma Greaser on Tuesday August 24th at 1:00 pm. Patient advised to arrive by 12:45pm o Advised to contact the patients health plan portal [phone])  to arrange referral prior to appointment . Successful outbound call to patients health plan portal to give appointment information and obtain patient ID ([id number]) o Informed by representative the registration was completed and referral would be sent to authorize services at [facility] . Confirmed appointment address and time with the patient . Assessed for transportation barriers to upcoming appointment o The patient reports she will try to arrange transportation via a friend o Advised the patient SW can assist with transportation arrangements via health plan benefit if needed . Scheduled follow up call over the next two weeks to remind the patient of her appointment and confirm transportation is arranged . Collaboration with embedded PharmD and RN Care Manager to inform of goal progression  Patient Self Care Activities:  . Self administers medications as prescribed . Calls pharmacy for medication refills . Performs ADL's independently . Calls provider office for new concerns or questions  Please see past updates related to this goal by clicking on the "Past Updates" button in the selected goal  Other   .  Collaborate with RN Care Manager and embedded PharmD to perform appropriate assessments to manage long-term care coordination needs        CARE PLAN ENTRY (see longitudinal plan of care for additional care plan information)  Current Barriers:  . Limited social support . Language barrier . Limited  understanding of health plan benefits to address patient social determinants of health . Chronic conditions including DM II and HTN which put patient at increased risk of hospitalization  Social Work Clinical Goal(s):  [name] Over the next 6 months the patient will work with SW to develop a better understanding of patient health plan benefits   CCM SW Interventions: Completed 07/27/20 . Successful outbound call placed to the patient to discuss food insecurity  . Patient reports she does have benefits under food and nutrition services that help supplement food needs . Discussed patient "Healthy Food" benefit offered under [place] health plan benefit o Patient reports she has tried to access this benefit in the past but the card was always declined o Determined the patient has contacted [facility] to request a new card be mailed to her home . Scheduled follow up call over the next two weeks to confirm receipt of new card  Completed 07/02/20 . Inter-disciplinary care team collaboration (see longitudinal plan of care) . Patient interviewed and appropriate assessments performed . Assisted the patient in contacting her health plan to obtain benefit information . Determined the patient has [employer] Dual Complete with benefits including Healthy Food Program, OTC benefit, and transportation . Healthy Food Program o Patient receives $55 per month on a pre-loaded card to access healthy food options at the grocery store o Confirmed the patient currently has a balance of $55 to be used by the end of August o Educated the patient on benefit and how to access while at the store o Encouraged the patient to access this benefit by the end of the month prior to losing funds for the month of August- the patient stated understanding . OTC Benefit o The patient is awarded $300 every quarter to be used on over the counter products listed in OTC Catalog o This benefit rolls over from month to month until the end of the plan year.  Patient current balance of $550.83 with another $300 set to be awarded in October.  o Patient advised any credit not used by November 27 2020 will not roll over the 2022 plan year o Confirmed the patient does have an OTC Catalog in her possession o Advised the patient to look through the catalog to determine products she would like to order to use in her home o Encouraged the patient to order items she may not need at this time but could utilize in the future, such as incontinent supplies, in order to access her benefit prior to losing at the end of 2021- patient stated understanding . Transportation o Patient has access to transportation services for medical trips within a 15 mile radius of her home o Educated the patient how to access this benefit as well as the need to schedule within a 3 business day timeframe - patient stated understanding . Advised the patient SW will outreach over the next month to review benefits with the patient and assist with access as needed . Provided the patient with SW direct contact number  . Collaboration with Consulting civil engineer and embedded PharmD to inform of above interventions and plan for SW follow up  CCM RN CM Interventions:  07/09/20 call completed with patient  . Determined patient is running low on food each month prior to getting her food stamps on the 15 th of each month . Determined she is also experiencing hardship paying her utility bills due to having limited income  . Collaborated with embedded BSW with notification of patient's concerns and need for further assistance with resources for food and cost of utilities . Discussed plans with patient for ongoing care management follow up and provided patient with direct contact information for care management team  Patient Self Care Activities:  . Patient verbalizes understanding of plan to contact SW as needed to assist with benefit access . Calls pharmacy for medication refills . Performs ADL's  independently . Performs IADL's independently  Please see past updates related to this goal by clicking on the "Past Updates" button in the selected goal          Follow Up Plan: SW will follow up with patient by phone over the next two weeks.   Allison Franco, BSW, CDP Social Worker, Certified Dementia Practitioner [place] [facility] [place] [facility] [phone]

## 2020-08-10 ENCOUNTER — Ambulatory Visit: Payer: Medicare Other

## 2020-08-10 DIAGNOSIS — E119 Type 2 diabetes mellitus without complications: Secondary | ICD-10-CM

## 2020-08-10 DIAGNOSIS — I10 Essential (primary) hypertension: Secondary | ICD-10-CM

## 2020-08-10 NOTE — Patient Instructions (Signed)
Social Worker Visit Information  Goals we discussed today:  Goals Addressed              This Visit's Progress     Patient Stated   .  "I need hearing aids" (pt-stated)        CARE PLAN ENTRY (see longitudinal plan of care for additional care plan information)  Current Barriers:  . Financial constraints related to cost of hearing aids . Limited knowledge of health plan benefits . Barriers surrounding the self health management of DM II and HTN   Social Work Clinical Goal(s):  [name] Over the next 45 days the patient will work with SW to better understand health plan benefits pertaining to coverage of hearing aid costs  CCM SW Interventions: Completed 08/10/20 . Successful outbound call placed to the patient to assess goal progression . Determined the patient did receive her hearing aids  o The patient reports she has experienced headaches while wearing the hearing aids o The patient has been back to her audiologist to have the hearing aids adjusted o The patient reports she continues to experience some headaches and will wear the hearing aids intermittently in an effort to avoid headache . Advised the patient SW would collaborate with RN Care Manager regarding complaints of headache . Collaboration with RN Care Manager regarding headaches while using hearing aids  Completed 07/27/20 . Successful outbound call placed to the patient to assess goal progression . Determined the patient did successfully attend appointment on 8/24 to have hearing test completed o Patient has scheduled appointment September 2nd to follow up and receive hearing aids . Discussed the patient has felt ringing in her ears which she has contacted [facility] about . Encouraged the patient to continue accessing [facility] as needed for in home services to avoid ED visits . Scheduled follow up call over the next two weeks to assess outcome of follow up appointment and confirm hearing aids received  Completed  07/13/20 . Successful outbound call placed to the patient to assess transportation needs for upcoming appointment . Confirmed the patient has secured transportation and does not need SW intervention for 8/24 appointment . SW will follow up with the patient over the next two weeks   Completed 07/02/20 . Successful outbound call placed to the patient to assist with navigating health plan benefits related to obtaining a hearing aid . Collaborative call placed to the patients health plan to determine the patient hearing aid benefits o Benefit of a $3600 credit with a $0 co-pay amount every two years to obtain up to two devices every two years . Identified four providers within network close to the patient home o Patient has selected [facility] [phone] . Successful outbound call placed to [facility] located at [address] [place] to arrange an appointment o Appointment scheduled for the patient to see Dr. Karma Greaser on Tuesday August 24th at 1:00 pm. Patient advised to arrive by 12:45pm o Advised to contact the patients health plan portal [phone])  to arrange referral prior to appointment . Successful outbound call to patients health plan portal to give appointment information and obtain patient ID ([id number]) o Informed by representative the registration was completed and referral would be sent to authorize services at [facility] . Confirmed appointment address and time with the patient . Assessed for transportation barriers to upcoming appointment o The patient reports she will try to arrange transportation via a friend o Advised the  patient SW can assist with transportation arrangements via health plan benefit if needed . Scheduled follow up call over the next two weeks to remind the patient of her appointment and confirm transportation is arranged . Collaboration with embedded PharmD and RN Care Manager to inform of goal progression  Patient Self Care  Activities:  . Self administers medications as prescribed . Calls pharmacy for medication refills . Performs ADL's independently . Calls provider office for new concerns or questions  Please see past updates related to this goal by clicking on the "Past Updates" button in the selected goal        Other   .  Collaborate with RN Care Manager and embedded PharmD to perform appropriate assessments to manage long-term care coordination needs        CARE PLAN ENTRY (see longitudinal plan of care for additional care plan information)  Current Barriers:  . Limited social support . Language barrier . Limited understanding of health plan benefits to address patient social determinants of health . Chronic conditions including DM II and HTN which put patient at increased risk of hospitalization  Social Work Clinical Goal(s):  [name] Over the next 6 months the patient will work with SW to develop a better understanding of patient health plan benefits   CCM SW Interventions: Completed 08/10/20 . Successful outbound call placed to the patient to assess goal progression . Determined the patient received an updated [employer] benefit card in the mail o The patient reports "this card still gets declined" . Attempted to assist the patient in contacting [employer] program to troubleshoot reasons the card is declined o The patient reported "I do not have the card with me right now" . Encouraged the patient to contact customer service herself or contact SW when she is ready to make a joint call . Scheduled follow up call over the next two weeks  Completed 07/27/20 . Successful outbound call placed to the patient to discuss food insecurity  . Patient reports she does have benefits under food and nutrition services that help supplement food needs . Discussed patient "Healthy Food" benefit offered under [facility] health plan benefit o Patient reports she has tried to access this benefit in the past but the card  was always declined o Determined the patient has contacted [facility] to request a new card be mailed to her home . Scheduled follow up call over the next two weeks to confirm receipt of new card  Completed 07/02/20 . Inter-disciplinary care team collaboration (see longitudinal plan of care) . Patient interviewed and appropriate assessments performed . Assisted the patient in contacting her health plan to obtain benefit information . Determined the patient has UHC Dual Complete with benefits including Healthy Food Program, OTC benefit, and transportation . Healthy Food Program o Patient receives $55 per month on a pre-loaded card to access healthy food options at the grocery store o Confirmed the patient currently has a balance of $55 to be used by the end of August o Educated the patient on benefit and how to access while at the store o Encouraged the patient to access this benefit by the end of the month prior to losing funds for the month of August- the patient stated understanding . OTC Benefit o The patient is awarded $300 every quarter to be used on over the counter products listed in OTC Catalog o This benefit rolls over from month to month until the end of the plan year. Patient current balance of $550.83  with another $300 set to be awarded in October.  o Patient advised any credit not used by November 27 2020 will not roll over the 2022 plan year o Confirmed the patient does have an OTC Catalog in her possession o Advised the patient to look through the catalog to determine products she would like to order to use in her home o Encouraged the patient to order items she may not need at this time but could utilize in the future, such as incontinent supplies, in order to access her benefit prior to losing at the end of 2021- patient stated understanding . Transportation o Patient has access to transportation services for medical trips within a 2 mile radius of her home o Educated the patient how  to access this benefit as well as the need to schedule within a 3 business day timeframe - patient stated understanding . Advised the patient SW will outreach over the next month to review benefits with the patient and assist with access as needed . Provided the patient with SW direct contact number  . Collaboration with Consulting civil engineer and embedded PharmD to inform of above interventions and plan for SW follow up  CCM RN CM Interventions:  07/09/20 call completed with patient  . Determined patient is running low on food each month prior to getting her food stamps on the 15 th of each month . Determined she is also experiencing hardship paying her utility bills due to having limited income  . Collaborated with embedded BSW with notification of patient's concerns and need for further assistance with resources for food and cost of utilities . Discussed plans with patient for ongoing care management follow up and provided patient with direct contact information for care management team  Patient Self Care Activities:  . Patient verbalizes understanding of plan to contact SW as needed to assist with benefit access . Calls pharmacy for medication refills . Performs ADL's independently . Performs IADL's independently  Please see past updates related to this goal by clicking on the "Past Updates" button in the selected goal          Follow Up Plan: SW will follow up with patient by phone over the next two weeks.   Daneen Schick, BSW, CDP Social Worker, Certified Dementia Practitioner [place] [facility] [place] [facility] [phone]

## 2020-08-10 NOTE — Chronic Care Management (AMB) (Signed)
Chronic Care Management    Social Work Follow Up Note  08/10/2020 Name: Allison Franco MRN: [id number] DOB: 04-21-1941  Allison Franco is a 79 y.o. year old female who is a primary care patient of [name], [place]. The CCM team was consulted for assistance with care coordination.   Review of patient status, including review of consultants reports, other relevant assessments, and collaboration with appropriate care team members and the patient's provider was performed as part of comprehensive patient evaluation and provision of chronic care management services.    SDOH (Social Determinants of Health) assessments performed: No    Outpatient Encounter Medications as of 08/10/2020  Medication Sig  . acetaminophen (TYLENOL) 500 MG tablet Take 1 tablet (500 mg total) by mouth every 6 (six) hours as needed for mild pain, moderate pain or headache. Reported on 05/10/2016 (Patient not taking: Reported on 05/27/2020)  . acetaminophen (TYLENOL) 650 MG CR tablet Take 1,300 mg by mouth in the morning and at bedtime.  . Alcohol Swabs (ALCOHOL WIPES) 70 % PADS by Does not apply route. Use with blood sugar check and injection of insulin (Patient not taking: Reported on 05/27/2020)  . blood glucose meter kit and supplies KIT Dispense based on patient and insurance preference. Use up to four times daily as directed. (FOR ICD-9 250.00, 250.01). (Patient not taking: Reported on 05/18/2020)  . Cholecalciferol (VITAMIN D3) 25 MCG (1000 UT) CAPS Take 1 capsule by mouth daily.  . clonazePAM (KLONOPIN) 0.5 MG tablet Take 0.5 mg by mouth at bedtime.   . diclofenac Sodium (VOLTAREN) 1 % GEL Apply 2 g topically 4 (four) times daily.  [name] glucose blood test strip Use as instructed to check blood sugars 3 times a day. Dx code: e11.65  . metFORMIN (GLUCOPHAGE) 500 MG tablet TAKE 1 TABLET BY MOUTH 2 TIMES EVERY DAY WITH MORNING AND EVENING MEALS (Patient taking differently: Take 500 mg by mouth daily after lunch. )  . olmesartan  (BENICAR) 20 MG tablet Take 1 tablet (20 mg total) by mouth daily.   No facility-administered encounter medications on file as of 08/10/2020.     Goals Addressed              This Visit's Progress     Patient Stated   .  "I need hearing aids" (pt-stated)        CARE PLAN ENTRY (see longitudinal plan of care for additional care plan information)  Current Barriers:  . Financial constraints related to cost of hearing aids . Limited knowledge of health plan benefits . Barriers surrounding the self health management of DM II and HTN   Social Work Clinical Goal(s):  [name] Over the next 45 days the patient will work with SW to better understand health plan benefits pertaining to coverage of hearing aid costs  CCM SW Interventions: Completed 08/10/20 . Successful outbound call placed to the patient to assess goal progression . Determined the patient did receive her hearing aids  o The patient reports she has experienced headaches while wearing the hearing aids o The patient has been back to her audiologist to have the hearing aids adjusted o The patient reports she continues to experience some headaches and will wear the hearing aids intermittently in an effort to avoid headache . Advised the patient SW would collaborate with RN Care Manager regarding complaints of headache . Collaboration with RN Care Manager regarding headaches while using hearing aids  Completed 07/27/20 . Successful outbound call placed to the patient to assess  goal progression . Determined the patient did successfully attend appointment on 8/24 to have hearing test completed o Patient has scheduled appointment September 2nd to follow up and receive hearing aids . Discussed the patient has felt ringing in her ears which she has contacted [facility] about . Encouraged the patient to continue accessing [facility] as needed for in home services to avoid ED visits . Scheduled follow up call over the next two weeks to  assess outcome of follow up appointment and confirm hearing aids received  Completed 07/13/20 . Successful outbound call placed to the patient to assess transportation needs for upcoming appointment . Confirmed the patient has secured transportation and does not need SW intervention for 8/24 appointment . SW will follow up with the patient over the next two weeks   Completed 07/02/20 . Successful outbound call placed to the patient to assist with navigating health plan benefits related to obtaining a hearing aid . Collaborative call placed to the patients health plan to determine the patient hearing aid benefits o Benefit of a $3600 credit with a $0 co-pay amount every two years to obtain up to two devices every two years . Identified four providers within network close to the patient home o Patient has selected [facility] [phone] . Successful outbound call placed to [facility] located at [address] to arrange an appointment o Appointment scheduled for the patient to see Dr. Karma Greaser on Tuesday August 24th at 1:00 pm. Patient advised to arrive by 12:45pm o Advised to contact the patients health plan portal [phone])  to arrange referral prior to appointment . Successful outbound call to patients health plan portal to give appointment information and obtain patient ID ([id number]) o Informed by representative the registration was completed and referral would be sent to authorize services at [facility] . Confirmed appointment address and time with the patient . Assessed for transportation barriers to upcoming appointment o The patient reports she will try to arrange transportation via a friend o Advised the patient SW can assist with transportation arrangements via health plan benefit if needed . Scheduled follow up call over the next two weeks to remind the patient of her appointment and confirm transportation is arranged . Collaboration with  embedded PharmD and RN Care Manager to inform of goal progression  Patient Self Care Activities:  . Self administers medications as prescribed . Calls pharmacy for medication refills . Performs ADL's independently . Calls provider office for new concerns or questions  Please see past updates related to this goal by clicking on the "Past Updates" button in the selected goal        Other   .  Collaborate with RN Care Manager and embedded PharmD to perform appropriate assessments to manage long-term care coordination needs        CARE PLAN ENTRY (see longitudinal plan of care for additional care plan information)  Current Barriers:  . Limited social support . Language barrier . Limited understanding of health plan benefits to address patient social determinants of health . Chronic conditions including DM II and HTN which put patient at increased risk of hospitalization  Social Work Clinical Goal(s):  [name] Over the next 6 months the patient will work with SW to develop a better understanding of patient health plan benefits   CCM SW Interventions: Completed 08/10/20 . Successful outbound call placed to the patient to assess goal progression . Determined the patient received an updated  [employer] benefit card in the mail o The patient reports "this card still gets declined" . Attempted to assist the patient in contacting [employer] program to troubleshoot reasons the card is declined o The patient reported "I do not have the card with me right now" . Encouraged the patient to contact customer service herself or contact SW when she is ready to make a joint call . Scheduled follow up call over the next two weeks  Completed 07/27/20 . Successful outbound call placed to the patient to discuss food insecurity  . Patient reports she does have benefits under food and nutrition services that help supplement food needs . Discussed patient "Healthy Food" benefit offered under [facility] health plan  benefit o Patient reports she has tried to access this benefit in the past but the card was always declined o Determined the patient has contacted [facility] to request a new card be mailed to her home . Scheduled follow up call over the next two weeks to confirm receipt of new card  Completed 07/02/20 . Inter-disciplinary care team collaboration (see longitudinal plan of care) . Patient interviewed and appropriate assessments performed . Assisted the patient in contacting her health plan to obtain benefit information . Determined the patient has UHC Dual Complete with benefits including Healthy Food Program, OTC benefit, and transportation . Healthy Food Program o Patient receives $55 per month on a pre-loaded card to access healthy food options at the grocery store o Confirmed the patient currently has a balance of $55 to be used by the end of August o Educated the patient on benefit and how to access while at the store o Encouraged the patient to access this benefit by the end of the month prior to losing funds for the month of August- the patient stated understanding . OTC Benefit o The patient is awarded $300 every quarter to be used on over the counter products listed in OTC Catalog o This benefit rolls over from month to month until the end of the plan year. Patient current balance of $550.83 with another $300 set to be awarded in October.  o Patient advised any credit not used by November 27 2020 will not roll over the 2022 plan year o Confirmed the patient does have an OTC Catalog in her possession o Advised the patient to look through the catalog to determine products she would like to order to use in her home o Encouraged the patient to order items she may not need at this time but could utilize in the future, such as incontinent supplies, in order to access her benefit prior to losing at the end of 2021- patient stated understanding . Transportation o Patient has access to transportation  services for medical trips within a 49 mile radius of her home o Educated the patient how to access this benefit as well as the need to schedule within a 3 business day timeframe - patient stated understanding . Advised the patient SW will outreach over the next month to review benefits with the patient and assist with access as needed . Provided the patient with SW direct contact number  . Collaboration with Consulting civil engineer and embedded PharmD to inform of above interventions and plan for SW follow up  CCM RN CM Interventions:  07/09/20 call completed with patient  . Determined patient is running low on food each month prior to getting her food stamps on the 15 th of each month . Determined she is also experiencing hardship  paying her utility bills due to having limited income  . Collaborated with embedded BSW with notification of patient's concerns and need for further assistance with resources for food and cost of utilities . Discussed plans with patient for ongoing care management follow up and provided patient with direct contact information for care management team  Patient Self Care Activities:  . Patient verbalizes understanding of plan to contact SW as needed to assist with benefit access . Calls pharmacy for medication refills . Performs ADL's independently . Performs IADL's independently  Please see past updates related to this goal by clicking on the "Past Updates" button in the selected goal          Follow Up Plan: SW will follow up with patient by phone over the next two weeks.   Daneen Schick, BSW, CDP Social Worker, Certified Dementia Practitioner [place] [facility] [place] [facility] [phone]  Total time spent performing care coordination and/or care management activities with the patient by phone or face to face = 14 minutes.

## 2020-08-14 ENCOUNTER — Other Ambulatory Visit: Payer: Self-pay | Admitting: Nurse Practitioner

## 2020-08-14 MED ORDER — GLUCOSE BLOOD VI STRP: ORAL_STRIP | 12 refills | Status: DC

## 2020-08-14 MED ORDER — METFORMIN HCL 500 MG PO TABS: 500.0000 mg | ORAL_TABLET | Freq: Every day | ORAL | 1 refills | Status: DC

## 2020-08-17 ENCOUNTER — Other Ambulatory Visit: Payer: Self-pay | Admitting: Nurse Practitioner

## 2020-08-17 ENCOUNTER — Other Ambulatory Visit: Payer: Self-pay | Admitting: Internal Medicine

## 2020-08-17 DIAGNOSIS — H401111 Primary open-angle glaucoma, right eye, mild stage: Secondary | ICD-10-CM | POA: Diagnosis not present

## 2020-08-17 DIAGNOSIS — H401123 Primary open-angle glaucoma, left eye, severe stage: Secondary | ICD-10-CM | POA: Diagnosis not present

## 2020-08-20 ENCOUNTER — Other Ambulatory Visit: Payer: Medicare Other

## 2020-08-21 ENCOUNTER — Telehealth: Payer: Medicare Other

## 2020-08-21 ENCOUNTER — Telehealth: Payer: Self-pay

## 2020-08-21 NOTE — Telephone Encounter (Signed)
  Chronic Care Management   Outreach Note  08/21/2020 Name: Allison Franco MRN: [id number] DOB: Apr 25, 1941  Referred by: [name], FNP Reason for referral : Care Coordination   An unsuccessful telephone outreach was attempted today. The patient was referred to the case management team for assistance with care management and care coordination.   Follow Up Plan: A HIPAA compliant phone message was left for the patient providing contact information and requesting a return call.  The care management team will reach out to the patient again over the next 10 days.   Daneen Schick, BSW, CDP Social Worker, Certified Dementia Practitioner [place] [facility] [place] [facility] [phone]

## 2020-08-24 ENCOUNTER — Ambulatory Visit: Payer: Self-pay

## 2020-08-24 ENCOUNTER — Other Ambulatory Visit: Payer: Self-pay

## 2020-08-24 DIAGNOSIS — I10 Essential (primary) hypertension: Secondary | ICD-10-CM

## 2020-08-24 DIAGNOSIS — E119 Type 2 diabetes mellitus without complications: Secondary | ICD-10-CM

## 2020-08-24 NOTE — Chronic Care Management (AMB) (Signed)
Chronic Care Management Pharmacy  Name: Allison Franco  MRN: [id number] DOB: 1941-08-29  Chief Complaint/ HPI  Allison Franco,  79 y.o. , female presents for their Follow-Up CCM visit with the clinical pharmacist In office.  PCP : [name], FNP  Their chronic conditions include: Hypertension, Type 2 Diabetes mellitus, Insomnia, anxiety/depression.  Office Visits: 05/18/2020 OV: Presented for HTN and DM follow up. Mild bilateral knee pain noted. Pt uses Biofreeze with significant relief. Pt not checking blood sugar at home due to affordability of strips. Referred to ophthalmology due for eye exam. Diabetic foot exam performed, normal findings. Bone density scan ordered. Labs ordered (BMP8+EGFR, HgbA1c, HepC antibody).   01/15/2020 AWV and OV:  Presented for DM follow up visit. Recommended eye exam, bone density ordered in June 2020. Rx for Prevnar13 13 and Tdap sent to pharmacy. Shingles vaccine discussed. Labs ordered (CMP14+EGFR, Lipid panel, HgbA1c). Prophylactic amoxicillin ordered for dental procedure.  Consult Visits: 04/05/2020 ED visit: Presented for tinnitus and SOB related to ears ringing. Referred to ENT (Dr. Benjamine Mola). Patient has been unable to obtain hearing aids.   10/08/2019  ENT OV w/ Dr. Wilburn Cornelia: Presented for follow up. Pt recommended for hearing aid but unable to afford in 2020. Sought care at [facility] audiology. 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  Medications: Outpatient Encounter Medications as of 08/24/2020  Medication Sig  . acetaminophen (TYLENOL) 500 MG tablet Take 1 tablet (500 mg total) by mouth every 6 (six) hours as needed for mild pain, moderate pain or headache. Reported on 05/10/2016 (Patient not taking: Reported on 05/27/2020)  . acetaminophen (TYLENOL) 650 MG CR tablet Take 1,300 mg by mouth in the morning and at bedtime.  . Alcohol Swabs (ALCOHOL WIPES) 70 % PADS by Does not apply route. Use with blood sugar check  and injection of insulin (Patient not taking: Reported on 05/27/2020)  . blood glucose meter kit and supplies KIT Dispense based on patient and insurance preference. Use up to four times daily as directed. (FOR ICD-9 250.00, 250.01). (Patient not taking: Reported on 05/18/2020)  . Cholecalciferol (VITAMIN D3) 25 MCG (1000 UT) CAPS Take 1 capsule by mouth daily.  . clonazePAM (KLONOPIN) 0.5 MG tablet Take 0.5 mg by mouth at bedtime.   . diclofenac Sodium (VOLTAREN) 1 % GEL Apply 2 g topically 4 (four) times daily.  [name] glucose blood test strip Use as instructed to check blood sugars 3 times a day. Dx code: e11.65  . metFORMIN (GLUCOPHAGE) 500 MG tablet Take 1 tablet (500 mg total) by mouth daily after lunch.  . olmesartan (BENICAR) 20 MG tablet TAKE 1 TABLET (20 MG TOTAL) BY MOUTH DAILY.   No facility-administered encounter medications on file as of 08/24/2020.   Allergies  Allergen Reactions  . Aspirin Other (See Comments)    insomnia  . Hydroxyzine Other (See Comments)    insomnia  . Valium [Diazepam] Other (See Comments)    Insomnia, 05/10/16 pt states she took as young person- made her depressed   Current Diagnosis/Assessment:  Goals Addressed   None     Diabetes   Recent Relevant Labs: Lab Results  Component Value Date/Time   HGBA1C 6.3 (H) 05/18/2020 11:31 AM   HGBA1C 6.4 (H) 01/15/2020 11:34 AM    Checking BG: Daily  Recent FBG Readings: 128,119,26,122,119,117,125,126,118 Recent pre-meal BG readings:  Recent 2hr PP BG readings:   Recent HS BG readings:  Patient has failed these meds in past: Levemir, Lantus, Tradjenta,  Patient  is currently controlled on the following medications:  Metformin 528m daily after lunch   Last diabetic Foot exam: 05/18/20  Last diabetic Eye exam:  Lab Results  Component Value Date/Time   HMDIABEYEEXA No Retinopathy 07/15/2020 12:00 AM    We discussed:  . Diet extensively o Pt states that she stays away from sugar and has since she was  a child o Pt eats oatmeal for breakfast o Pt mentions eating: green beans, lettuce, whole wheat bread, salmon, fish, chicken, grapes, bananas, bran, cherries, egg whites, garlic o Pt does not eat much red meat, rice, spaghetti o Pt does not drink milk o Pt expresses understanding of what foods raise her BG o Provided patient with Daily Diabetes Meal Planning Guide and BG log book in office Exercise extensively Walks a lot  Plan  Continue current medications   Hypertension   Office blood pressures are  BP Readings from Last 3 Encounters:  05/18/20 136/86  04/05/20 131/73  01/15/20 126/84    Patient has failed these meds in the past: Lisinopril Patient is currently uncontrolled on the following medications:    Olmesartan 20 mg daily  Patient checks BP at home several times per month  Patient home BP readings are ranging: 140/82 when checked in office today  We discussed: Diet and exercise extensively  Pt states she does not get a lot of headaches  Pt states she does not have a working BP monitor for home use  Contact insurance to determine if she can obtain BP from OTC catalog (pt also wants to know when they are sending her an OTC catalog)  Pt stated she needs refill for olmesartan sent to pharmacy  Determined prescription was sent on 9/20  Plan Continue current medications   Anxiety/Depression/Insomnia   Patient has failed these meds in past: Hydroxyzine, zolpidem Patient is currently controlled on the following medications:  Clonazepam 0.5 mg twice daily (pt only takes 1 tablet at night)  Plan Continue current medications  Osteopenia / Osteoporosis Screening   Last DEXA Scan: Recommended at [facility] 06/21  Patient has failed these meds in past: Calcium carbonate, calcium with d Patient is currently controlled on the following medications:   Cholecalciferol 1000 units daily  Plan Continue current medications  Recommend Vitamin D level at next  appointment Recommend scheduling DEXA scan  Knee Pain   Patient is currently uncontrolled on the following medications:   Diclofenac gel 1% 2 g 4 times daily   Tylenol 6541m2 tablets in the morning  We discussed:  Pt states her knees are feeling better  Diclofenac has helped her knee pain  Pt states she normally take 2 Tylenol tablets in the morning but may take more in the evening if needed  Plan Collaborate with PCP to recommend diclofenac 1% gel as needed for knee pain  Vaccines   Reviewed and discussed patient's vaccination history.  Immunization History  Administered Date(s) Administered  . Influenza Whole 11/18/2009  . Influenza-Unspecified 09/24/2018  . PFIZER SARS-COV-2 Vaccination 02/14/2020, 03/10/2020  . Pneumococcal Polysaccharide-23 02/01/2007  . Td 02/01/2007   Prevnar 13 received 01/16/20 Tdap received 01/09/20  We discussed:  COVID booster vaccine due in December  Plan Recommended patient receive Shingrix vaccine at pharmacy.   Hearing loss and tinnitus   We discussed:   Pt has obtained hearing aides and states they have helped her ears  Medication Management   Pt uses [facility] for all medications Uses pill box? No - because  she does not take many medications Pt endorses 100% compliance, but is not taking all medications as prescribed  We discussed:   Importance of taking medications every day as directed  Delivery option available at [facility] would be beneficial since patient does not have a car  Pt states she loves [facility] and they are like family to her  She has no problem walking to pharmacy if the weather is nice, if the weather is bad other people can pick up her medications for her  Medications free with new insurance plan  Plan Continue current medication management strategy  Follow up: 3 month phone visit  Jannette Fogo, PharmD Clinical Pharmacist [facility] [phone]

## 2020-08-27 ENCOUNTER — Ambulatory Visit (INDEPENDENT_AMBULATORY_CARE_PROVIDER_SITE_OTHER): Payer: Medicare Other

## 2020-08-27 DIAGNOSIS — E119 Type 2 diabetes mellitus without complications: Secondary | ICD-10-CM | POA: Diagnosis not present

## 2020-08-27 DIAGNOSIS — I10 Essential (primary) hypertension: Secondary | ICD-10-CM | POA: Diagnosis not present

## 2020-08-27 NOTE — Patient Instructions (Addendum)
Visit Information  Goals Addressed            This Visit's Progress   . Pharmacy Care Plan       CARE PLAN ENTRY (see longitudinal plan of care for additional care plan information)  Current Barriers:  . Chronic Disease Management support, education, and care coordination needs related to Hypertension, Diabetes, Depression, and Anxiety   Hypertension BP Readings from Last 3 Encounters:  05/18/20 136/86  04/05/20 131/73  01/15/20 126/84   . Pharmacist Clinical Goal(s): o Over the next 90 days, patient will work with PharmD and providers to achieve BP goal <130/80 . Current regimen:  o Olmesartan 20mg  daily . Interventions: o Stressed importance of medication adherence o Provided dietary and exercise recommendations o [place] to determine if they are sending patient an Journalist, newspaper and if she can obtain home blood pressure monitor through catalog . Patient self care activities - Over the next 90 days, patient will: o Check BP several times weekly, document, and provide at future appointments o Ensure daily salt intake < 2300 mg/day o Exercise 30 minutes a day 5 day per week  Diabetes Lab Results  Component Value Date/Time   HGBA1C 6.3 (H) 05/18/2020 11:31 AM   HGBA1C 6.4 (H) 01/15/2020 11:34 AM   . Pharmacist Clinical Goal(s): o Over the next 90 days, patient will work with PharmD and providers to maintain A1c goal <7% . Current regimen:  o Metformin 500mg  once daily after lunch . Interventions: o Provided dietary and exercise recommendations o Provided patient with Daily Diabetes Meal Planning Guide and blood glucose log . Patient self care activities - Over the next 90 days, patient will: o Check blood sugar every 3-4 days and if symptomatic, document, and provide at future appointments if able to obtain strips from pharmacy o Contact provider with any episodes of hypoglycemia  Knee Pain . Pharmacist Clinical Goal(s) o Over the next 30 days,  patient will work with PharmD and providers to relieve knee pain . Current regimen:  o Diclofenac 2g 4 times daily . Interventions: o Determined diclofenac has been helping with patient's knee pain . Patient self care activities - Over the next 30 days, patient will: o Continue using diclofenac for knee pain  Medication management . Pharmacist Clinical Goal(s): o Over the next 90 days, patient will work with PharmD and providers to achieve optimal medication adherence . Current pharmacy: [facility] and [place] . Interventions o Comprehensive medication review performed. o Continue current medication management strategy . Patient self care activities - Over the next 90 days, patient will: o Focus on medication adherence by considering use of pill box o Consider adherence packaging and delivery available at [facility] o Take medications as prescribed o Report any questions or concerns to PharmD and/or provider(s)  Please see past updates related to this goal by clicking on the "Past Updates" button in the selected goal          The patient verbalized understanding of instructions provided today and agreed to receive a mailed copy of patient instruction and/or educational materials.  Telephone follow up appointment with pharmacy team member scheduled for: 11/23/20 @ 4:15 PM  Jannette Fogo, PharmD Clinical Pharmacist [facility] [phone]    DASH Eating Plan DASH stands for "Dietary Approaches to Stop Hypertension." The DASH eating plan is a healthy eating plan that has been shown to reduce high blood pressure (hypertension). It may also reduce your risk for type 2  diabetes, heart disease, and stroke. The DASH eating plan may also help with weight loss. What are tips for following this plan?  General guidelines  Avoid eating more than 2,300 mg (milligrams) of salt (sodium) a day. If you have hypertension, you may need to reduce  your sodium intake to 1,500 mg a day.  Limit alcohol intake to no more than 1 drink a day for nonpregnant women and 2 drinks a day for men. One drink equals 12 oz of beer, 5 oz of wine, or 1 oz of hard liquor.  Work with your health care provider to maintain a healthy body weight or to lose weight. Ask what an ideal weight is for you.  Get at least 30 minutes of exercise that causes your heart to beat faster (aerobic exercise) most days of the week. Activities may include walking, swimming, or biking.  Work with your health care provider or diet and nutrition specialist (dietitian) to adjust your eating plan to your individual calorie needs. Reading food labels   Check food labels for the amount of sodium per serving. Choose foods with less than 5 percent of the Daily Value of sodium. Generally, foods with less than 300 mg of sodium per serving fit into this eating plan.  To find whole grains, look for the word "whole" as the first word in the ingredient list. Shopping  Buy products labeled as "low-sodium" or "no salt added."  Buy fresh foods. Avoid canned foods and premade or frozen meals. Cooking  Avoid adding salt when cooking. Use salt-free seasonings or herbs instead of table salt or sea salt. Check with your health care provider or pharmacist before using salt substitutes.  Do not fry foods. Cook foods using healthy methods such as baking, boiling, grilling, and broiling instead.  Cook with heart-healthy oils, such as olive, canola, soybean, or sunflower oil. Meal planning  Eat a balanced diet that includes: ? 5 or more servings of fruits and vegetables each day. At each meal, try to fill half of your plate with fruits and vegetables. ? Up to 6-8 servings of whole grains each day. ? Less than 6 oz of lean meat, poultry, or fish each day. A 3-oz serving of meat is about the same size as a deck of cards. One egg equals 1 oz. ? 2 servings of low-fat dairy each day. ? A serving  of nuts, seeds, or beans 5 times each week. ? Heart-healthy fats. Healthy fats called Omega-3 fatty acids are found in foods such as flaxseeds and coldwater fish, like sardines, salmon, and mackerel.  Limit how much you eat of the following: ? Canned or prepackaged foods. ? Food that is high in trans fat, such as fried foods. ? Food that is high in saturated fat, such as fatty meat. ? Sweets, desserts, sugary drinks, and other foods with added sugar. ? Full-fat dairy products.  Do not salt foods before eating.  Try to eat at least 2 vegetarian meals each week.  Eat more home-cooked food and less restaurant, buffet, and fast food.  When eating at a restaurant, ask that your food be prepared with less salt or no salt, if possible. What foods are recommended? The items listed may not be a complete list. Talk with your dietitian about what dietary choices are best for you. Grains Whole-grain or whole-wheat bread. Whole-grain or whole-wheat pasta. Brown rice. [place]. Bulgur. Whole-grain and low-sodium cereals. Pita bread. Low-fat, low-sodium crackers. Whole-wheat flour tortillas. Vegetables Fresh or frozen vegetables (  raw, steamed, roasted, or grilled). Low-sodium or reduced-sodium tomato and vegetable juice. Low-sodium or reduced-sodium tomato sauce and tomato paste. Low-sodium or reduced-sodium canned vegetables. Fruits All fresh, dried, or frozen fruit. Canned fruit in natural juice (without added sugar). Meat and other protein foods Skinless chicken or [place]. Ground chicken or [place]. Pork with fat trimmed off. Fish and seafood. Egg whites. Dried beans, peas, or lentils. Unsalted nuts, nut butters, and seeds. Unsalted canned beans. Lean cuts of beef with fat trimmed off. Low-sodium, lean deli meat. Dairy Low-fat (1%) or fat-free (skim) milk. Fat-free, low-fat, or reduced-fat cheeses. Nonfat, low-sodium ricotta or cottage cheese. Low-fat or nonfat yogurt. Low-fat, low-sodium  cheese. Fats and oils Soft margarine without trans fats. Vegetable oil. Low-fat, reduced-fat, or light mayonnaise and salad dressings (reduced-sodium). Canola, safflower, olive, soybean, and sunflower oils. Avocado. Seasoning and other foods Herbs. Spices. Seasoning mixes without salt. Unsalted popcorn and pretzels. Fat-free sweets. What foods are not recommended? The items listed may not be a complete list. Talk with your dietitian about what dietary choices are best for you. Grains Baked goods made with fat, such as croissants, muffins, or some breads. Dry pasta or rice meal packs. Vegetables Creamed or fried vegetables. Vegetables in a cheese sauce. Regular canned vegetables (not low-sodium or reduced-sodium). Regular canned tomato sauce and paste (not low-sodium or reduced-sodium). Regular tomato and vegetable juice (not low-sodium or reduced-sodium). Angie Fava. Olives. Fruits Canned fruit in a light or heavy syrup. Fried fruit. Fruit in cream or butter sauce. Meat and other protein foods Fatty cuts of meat. Ribs. Fried meat. Berniece Salines. Sausage. Bologna and other processed lunch meats. Salami. Fatback. Hotdogs. Bratwurst. Salted nuts and seeds. Canned beans with added salt. Canned or smoked fish. Whole eggs or egg yolks. Chicken or [place] with skin. Dairy Whole or 2% milk, cream, and half-and-half. Whole or full-fat cream cheese. Whole-fat or sweetened yogurt. Full-fat cheese. Nondairy creamers. Whipped toppings. Processed cheese and cheese spreads. Fats and oils Butter. Stick margarine. Lard. Shortening. Ghee. Bacon fat. Tropical oils, such as coconut, palm kernel, or palm oil. Seasoning and other foods Salted popcorn and pretzels. Onion salt, garlic salt, seasoned salt, table salt, and sea salt. Worcestershire sauce. Tartar sauce. Barbecue sauce. Teriyaki sauce. Soy sauce, including reduced-sodium. Steak sauce. Canned and packaged gravies. Fish sauce. Oyster sauce. Cocktail sauce. Horseradish  that you find on the shelf. Ketchup. Mustard. Meat flavorings and tenderizers. Bouillon cubes. Hot sauce and Tabasco sauce. Premade or packaged marinades. Premade or packaged taco seasonings. Relishes. Regular salad dressings. Where to find more information:  National Heart, Lung, and [place]: https://wilson-eaton.com/  American Heart Association: www.heart.org Summary  The DASH eating plan is a healthy eating plan that has been shown to reduce high blood pressure (hypertension). It may also reduce your risk for type 2 diabetes, heart disease, and stroke.  With the DASH eating plan, you should limit salt (sodium) intake to 2,300 mg a day. If you have hypertension, you may need to reduce your sodium intake to 1,500 mg a day.  When on the DASH eating plan, aim to eat more fresh fruits and vegetables, whole grains, lean proteins, low-fat dairy, and heart-healthy fats.  Work with your health care provider or diet and nutrition specialist (dietitian) to adjust your eating plan to your individual calorie needs. This information is not intended to replace advice given to you by your health care provider. Make sure you discuss any questions you have with your health care provider. Document Revised: 10/27/2017 Document Reviewed: 11/07/2016  Elsevier Patient Education  [employer].

## 2020-08-28 NOTE — Chronic Care Management (AMB) (Signed)
Chronic Care Management    Social Work Follow Up Note  08/27/20 Name: Allison Franco MRN: [id number] DOB: [birth year]  Allison Franco is a 79 y.o. year old female who is a primary care patient of [name], [place]. The CCM team was consulted for assistance with care coordination.   Review of patient status, including review of consultants reports, other relevant assessments, and collaboration with appropriate care team members and the patient's provider was performed as part of comprehensive patient evaluation and provision of chronic care management services.    SDOH (Social Determinants of Health) assessments performed: No    Outpatient Encounter Medications as of 08/27/2020  Medication Sig   acetaminophen (TYLENOL) 500 MG tablet Take 1 tablet (500 mg total) by mouth every 6 (six) hours as needed for mild pain, moderate pain or headache. Reported on 05/10/2016 (Patient not taking: Reported on 05/27/2020)   acetaminophen (TYLENOL) 650 MG CR tablet Take 1,300 mg by mouth in the morning. More in the evening if needed   Alcohol Swabs (ALCOHOL WIPES) 70 % PADS by Does not apply route. Use with blood sugar check and injection of insulin (Patient not taking: Reported on 05/27/2020)   blood glucose meter kit and supplies KIT Dispense based on patient and insurance preference. Use up to four times daily as directed. (FOR ICD-9 250.00, 250.01).   Cholecalciferol (VITAMIN D3) 25 MCG (1000 UT) CAPS Take 1 capsule by mouth daily.   clonazePAM (KLONOPIN) 0.5 MG tablet Take 0.5 mg by mouth at bedtime.    diclofenac Sodium (VOLTAREN) 1 % GEL Apply 2 g topically 4 (four) times daily.   glucose blood test strip Use as instructed to check blood sugars 3 times a day. Dx code: e11.65   latanoprost (XALATAN) 0.005 % ophthalmic solution Place 1 drop into both eyes at bedtime.   metFORMIN (GLUCOPHAGE) 500 MG tablet Take 1 tablet (500 mg total) by mouth daily after lunch.   olmesartan (BENICAR) 20 MG tablet TAKE  1 TABLET (20 MG TOTAL) BY MOUTH DAILY.   No facility-administered encounter medications on file as of 08/27/2020.     Goals Addressed            This Visit's Progress    Collaborate with RN Care Manager and embedded PharmD to perform appropriate assessments to manage long-term care coordination needs   Not on track    [place] (see longitudinal plan of care for additional care plan information)  Current Barriers:   Limited social support  Language barrier  Limited understanding of health plan benefits to address patient social determinants of health  Chronic conditions including DM II and HTN which put patient at increased risk of hospitalization  Social Work Clinical Goal(s):   Over the next 6 months the patient will work with SW to develop a better understanding of patient health plan benefits   CCM SW Interventions: Completed 08/27/20  Successful outbound call placed to the patient to assess goal progression  Determined the patient has yet to successfully access healthy food benefit   Joint call placed to the patients health plan to discuss concern over the patients inability to purchase health foods with her benefits card o Transferred to [place] the vendor who [place] o Informed by [employer] rep the patients card is active and should be working o Discussed the patient has tried on 3 separate occassions to buy healthy foods such as fruits, vegetables, and cereals but the card was declined o Informed by [employer] "you can  not buy soda, candy, or lunch ,meat with the card because that is not healthy" o Reiterated the patient is attempting to purchase healthy food and the card is being declined o Advised by [place] to "try again" and if it happens again to call back  Advised the patient to try again with her card sticking to just fruits and vegetables to see if successful  Scheduled follow up call over the next 3 weeks  Completed  08/10/20  Successful outbound call placed to the patient to assess goal progression  Determined the patient received an updated [employer] benefit card in the mail o The patient reports "this card still gets declined"  Attempted to assist the patient in contacting Healthy Foods program to troubleshoot reasons the card is declined o The patient reported "I do not have the card with me right now"  Encouraged the patient to contact customer service herself or contact SW when she is ready to make a joint call  Scheduled follow up call over the next two weeks  Completed 07/27/20  Successful outbound call placed to the patient to discuss food insecurity   Patient reports she does have benefits under food and nutrition services that help supplement food needs  Discussed patient "Healthy Food" benefit offered under [facility] health plan benefit o Patient reports she has tried to access this benefit in the past but the card was always declined o Determined the patient has contacted [facility] to request a new card be mailed to her home  Scheduled follow up call over the next two weeks to confirm receipt of new card  Completed 07/02/20  Inter-disciplinary care team collaboration (see longitudinal plan of care)  Patient interviewed and appropriate assessments performed  Assisted the patient in contacting her health plan to obtain benefit information  Determined the patient has [facility] Dual Complete with benefits including Healthy Food Program, OTC benefit, and transportation  [facility] o Patient receives $55 per month on a pre-loaded card to access healthy food options at the grocery store o Confirmed the patient currently has a balance of $55 to be used by the end of August o Educated the patient on benefit and how to access while at the store o Encouraged the patient to access this benefit by the end of the month prior to losing funds for the month of August- the patient stated  understanding  OTC Benefit o The patient is awarded $300 every quarter to be used on over the counter products listed in [employer] o This benefit rolls over from month to month until the end of the plan year. Patient current balance of $550.83 with another $300 set to be awarded in October.  o Patient advised any credit not used by November 27 2020 will not roll over the 2022 plan year o Confirmed the patient does have an OTC Catalog in her possession o Advised the patient to look through the catalog to determine products she would like to order to use in her home o Encouraged the patient to order items she may not need at this time but could utilize in the future, such as incontinent supplies, in order to access her benefit prior to losing at the end of 2021- patient stated understanding  Transportation o Patient has access to transportation services for medical trips within a 50 mile radius of her home o Educated the patient how to access this benefit as well as the need to schedule within a 3 business day timeframe -  patient stated understanding  Advised the patient SW will outreach over the next month to review benefits with the patient and assist with access as needed  Provided the patient with SW direct contact number   Collaboration with RN Care Manager and embedded PharmD to inform of above interventions and plan for SW follow up  CCM RN CM Interventions:  07/09/20 call completed with patient   Determined patient is running low on food each month prior to getting her food stamps on the 15 th of each month  Determined she is also experiencing hardship paying her utility bills due to having limited income   Collaborated with embedded BSW with notification of patient's concerns and need for further assistance with resources for food and cost of utilities  Discussed plans with patient for ongoing care management follow up and provided patient with direct contact information for  care management team  Patient Self Care Activities:   Patient verbalizes understanding of plan to contact SW as needed to assist with benefit access  Calls pharmacy for medication refills  Performs ADL's independently  Performs IADL's independently  Please see past updates related to this goal by clicking on the "Past Updates" button in the selected goal          Follow Up Plan: SW will follow up with patient by phone over the next three weeks,   Daneen Schick, BSW, CDP Social Worker, Certified Dementia Practitioner [place] [facility] [place] [facility] [phone]  Total time spent performing care coordination and/or care management activities with the patient by phone or face to face = 51 minutes.

## 2020-08-28 NOTE — Patient Instructions (Signed)
Social Worker Visit Information  Goals we discussed today:  Goals Addressed            This Visit's Progress   . Collaborate with RN Care Manager and embedded PharmD to perform appropriate assessments to manage long-term care coordination needs   Not on track    [place] (see longitudinal plan of care for additional care plan information)  Current Barriers:  . Limited social support . Language barrier . Limited understanding of health plan benefits to address patient social determinants of health . Chronic conditions including DM II and HTN which put patient at increased risk of hospitalization  Social Work Clinical Goal(s):  [name] Over the next 6 months the patient will work with SW to develop a better understanding of patient health plan benefits   CCM SW Interventions: Completed 08/27/20 . Successful outbound call placed to the patient to assess goal progression . Determined the patient has yet to successfully access healthy food benefit  . Joint call placed to the patients health plan to discuss concern over the patients inability to purchase health foods with her benefits card o Transferred to [place] vendor who [place] o Informed by [employer] rep the patients card is active and should be working o Discussed the patient has tried on 3 separate occassions to buy healthy foods such as fruits, vegetables, and cereals but the card was declined o Informed by [place] "you can not buy soda, candy, or lunch ,meat with the card because that is not healthy" o Reiterated the patient is attempting to purchase healthy food and the card is being declined o Advised by [place] to "try again" and if it happens again to call back . Advised the patient to try again with her card sticking to just fruits and vegetables to see if successful . Scheduled follow up call over the next 3 weeks  Completed 08/10/20 . Successful outbound call placed to the patient to assess goal  progression . Determined the patient received an updated [employer] benefit card in the mail o The patient reports "this card still gets declined" . Attempted to assist the patient in contacting Healthy Foods program to troubleshoot reasons the card is declined o The patient reported "I do not have the card with me right now" . Encouraged the patient to contact customer service herself or contact SW when she is ready to make a joint call . Scheduled follow up call over the next two weeks  Completed 07/27/20 . Successful outbound call placed to the patient to discuss food insecurity  . Patient reports she does have benefits under food and nutrition services that help supplement food needs . Discussed patient "Healthy Food" benefit offered under [facility] health plan benefit o Patient reports she has tried to access this benefit in the past but the card was always declined o Determined the patient has contacted [facility] to request a new card be mailed to her home . Scheduled follow up call over the next two weeks to confirm receipt of new card  Completed 07/02/20 . Inter-disciplinary care team collaboration (see longitudinal plan of care) . Patient interviewed and appropriate assessments performed . Assisted the patient in contacting her health plan to obtain benefit information . Determined the patient has [employer] Dual Complete with benefits including Healthy Food Program, OTC benefit, and transportation . Healthy Food Program o Patient receives $55 per month on a pre-loaded card to access healthy food options at the grocery store o Confirmed the  patient currently has a balance of $55 to be used by the end of August o Educated the patient on benefit and how to access while at the store o Encouraged the patient to access this benefit by the end of the month prior to losing funds for the month of August- the patient stated understanding . OTC Benefit o The patient is awarded $300 every quarter to be used on  over the counter products listed in OTC Catalog o This benefit rolls over from month to month until the end of the plan year. Patient current balance of $550.83 with another $300 set to be awarded in October.  o Patient advised any credit not used by November 27 2020 will not roll over the 2022 plan year o Confirmed the patient does have an OTC Catalog in her possession o Advised the patient to look through the catalog to determine products she would like to order to use in her home o Encouraged the patient to order items she may not need at this time but could utilize in the future, such as incontinent supplies, in order to access her benefit prior to losing at the end of 2021- patient stated understanding . Transportation o Patient has access to transportation services for medical trips within a 44 mile radius of her home o Educated the patient how to access this benefit as well as the need to schedule within a 3 business day timeframe - patient stated understanding . Advised the patient SW will outreach over the next month to review benefits with the patient and assist with access as needed . Provided the patient with SW direct contact number  . Collaboration with Consulting civil engineer and embedded PharmD to inform of above interventions and plan for SW follow up  CCM RN CM Interventions:  07/09/20 call completed with patient  . Determined patient is running low on food each month prior to getting her food stamps on the 15 th of each month . Determined she is also experiencing hardship paying her utility bills due to having limited income  . Collaborated with embedded BSW with notification of patient's concerns and need for further assistance with resources for food and cost of utilities . Discussed plans with patient for ongoing care management follow up and provided patient with direct contact information for care management team  Patient Self Care Activities:  . Patient verbalizes understanding  of plan to contact SW as needed to assist with benefit access . Calls pharmacy for medication refills . Performs ADL's independently . Performs IADL's independently  Please see past updates related to this goal by clicking on the "Past Updates" button in the selected goal          Follow Up Plan: SW will follow up with patient by phone over the next three weeks.   Daneen Schick, BSW, CDP Social Worker, Certified Dementia Practitioner [place] [facility] [place] [facility] [phone]

## 2020-09-07 ENCOUNTER — Ambulatory Visit: Payer: Medicare Other

## 2020-09-07 DIAGNOSIS — E119 Type 2 diabetes mellitus without complications: Secondary | ICD-10-CM

## 2020-09-07 DIAGNOSIS — I10 Essential (primary) hypertension: Secondary | ICD-10-CM

## 2020-09-07 NOTE — Chronic Care Management (AMB) (Signed)
Chronic Care Management    Social Work Follow Up Note  09/07/2020 Name: Allison Franco MRN: [id number] DOB: 04/14/1941  Allison Franco is a 79 y.o. year old female who is a primary care patient of [name], [place]. The CCM team was consulted for assistance with care coordination.   Review of patient status, including review of consultants reports, other relevant assessments, and collaboration with appropriate care team members and the patient's provider was performed as part of comprehensive patient evaluation and provision of chronic care management services.    SDOH (Social Determinants of Health) assessments performed: No    Outpatient Encounter Medications as of 09/07/2020  Medication Sig   acetaminophen (TYLENOL) 500 MG tablet Take 1 tablet (500 mg total) by mouth every 6 (six) hours as needed for mild pain, moderate pain or headache. Reported on 05/10/2016 (Patient not taking: Reported on 05/27/2020)   acetaminophen (TYLENOL) 650 MG CR tablet Take 1,300 mg by mouth in the morning. More in the evening if needed   Alcohol Swabs (ALCOHOL WIPES) 70 % PADS by Does not apply route. Use with blood sugar check and injection of insulin (Patient not taking: Reported on 05/27/2020)   blood glucose meter kit and supplies KIT Dispense based on patient and insurance preference. Use up to four times daily as directed. (FOR ICD-9 250.00, 250.01).   Cholecalciferol (VITAMIN D3) 25 MCG (1000 UT) CAPS Take 1 capsule by mouth daily.   clonazePAM (KLONOPIN) 0.5 MG tablet Take 0.5 mg by mouth at bedtime.    diclofenac Sodium (VOLTAREN) 1 % GEL Apply 2 g topically 4 (four) times daily.   glucose blood test strip Use as instructed to check blood sugars 3 times a day. Dx code: e11.65   latanoprost (XALATAN) 0.005 % ophthalmic solution Place 1 drop into both eyes at bedtime.   metFORMIN (GLUCOPHAGE) 500 MG tablet Take 1 tablet (500 mg total) by mouth daily after lunch.   olmesartan (BENICAR) 20 MG tablet  TAKE 1 TABLET (20 MG TOTAL) BY MOUTH DAILY.   No facility-administered encounter medications on file as of 09/07/2020.     Goals Addressed            This Visit's Progress    Collaborate with RN Care Manager and embedded PharmD to perform appropriate assessments to manage long-term care coordination needs   On track    [place] (see longitudinal plan of care for additional care plan information)  Current Barriers:   Limited social support  Language barrier  Limited understanding of health plan benefits to address patient social determinants of health  Chronic conditions including DM II and HTN which put patient at increased risk of hospitalization  Social Work Clinical Goal(s):   Over the next 6 months the patient will work with SW to develop a better understanding of patient health plan benefits   CCM SW Interventions: Completed 09/07/20  Successful outbound call placed to the patient to assess goal progression  Determined the patient was successful in purchasing food under her [place] card at [place] patient OTC benefit and opportunity to access $700 worth of savings through OTC catalog by the end of 2021 o Patient reports she has yet to receive OTC catalog via mail  Advised the patient SW would obtain a catalog and print for her to pick up from the practice at an [place] appointment  Collaboration with colleague to obtain [facility] Dual plan OTC catalog  Scheduled follow up over the next month  Completed 08/27/20  Successful outbound call placed to the patient to assess goal progression  Determined the patient has yet to successfully access healthy food benefit   Joint call placed to the patients health plan to discuss concern over the patients inability to purchase health foods with her benefits card o Transferred to [employer] the vendor who [employer] o Informed by [employer] rep the patients card is active and should be  working o Discussed the patient has tried on 3 separate occassions to buy healthy foods such as fruits, vegetables, and cereals but the card was declined o Informed by [employer] "you can not buy soda, candy, or lunch ,meat with the card because that is not healthy" o Reiterated the patient is attempting to purchase healthy food and the card is being declined o Advised by [place] to "try again" and if it happens again to call back  Advised the patient to try again with her card sticking to just fruits and vegetables to see if successful  Scheduled follow up call over the next 3 weeks  Completed 08/10/20  Successful outbound call placed to the patient to assess goal progression  Determined the patient received an updated [employer] benefit card in the mail o The patient reports "this card still gets declined"  Attempted to assist the patient in contacting Healthy Foods program to troubleshoot reasons the card is declined o The patient reported "I do not have the card with me right now"  Encouraged the patient to contact customer service herself or contact SW when she is ready to make a joint call  Scheduled follow up call over the next two weeks  Completed 07/27/20  Successful outbound call placed to the patient to discuss food insecurity   Patient reports she does have benefits under food and nutrition services that help supplement food needs  Discussed patient "Healthy Food" benefit offered under [facility] health plan benefit o Patient reports she has tried to access this benefit in the past but the card was always declined o Determined the patient has contacted [facility] to request a new card be mailed to her home  Scheduled follow up call over the next two weeks to confirm receipt of new card  Completed 07/02/20  Inter-disciplinary care team collaboration (see longitudinal plan of care)  Patient interviewed and appropriate assessments performed  Assisted the patient in contacting her  health plan to obtain benefit information  Determined the patient has [facility] Dual Complete with benefits including Healthy [employer], OTC benefit, and transportation  [facility] o Patient receives $55 per month on a pre-loaded card to access healthy food options at the grocery store o Confirmed the patient currently has a balance of $55 to be used by the end of August o Educated the patient on benefit and how to access while at the store o Encouraged the patient to access this benefit by the end of the month prior to losing funds for the month of August- the patient stated understanding  OTC Benefit o The patient is awarded $300 every quarter to be used on over the counter products listed in [employer] o This benefit rolls over from month to month until the end of the plan year. Patient current balance of $550.83 with another $300 set to be awarded in October.  o Patient advised any credit not used by November 27 2020 will not roll over the 2022 plan year o Confirmed the patient does have an OTC Catalog in her possession  o Advised the patient to look through the catalog to determine products she would like to order to use in her home o Encouraged the patient to order items she may not need at this time but could utilize in the future, such as incontinent supplies, in order to access her benefit prior to losing at the end of 2021- patient stated understanding  Transportation o Patient has access to transportation services for medical trips within a 42 mile radius of her home o Educated the patient how to access this benefit as well as the need to schedule within a 3 business day timeframe - patient stated understanding  Advised the patient SW will outreach over the next month to review benefits with the patient and assist with access as needed  Provided the patient with SW direct contact number   Collaboration with Consulting civil engineer and embedded PharmD to inform of above  interventions and plan for SW follow up  CCM RN CM Interventions:  07/09/20 call completed with patient   Determined patient is running low on food each month prior to getting her food stamps on the 15 th of each month  Determined she is also experiencing hardship paying her utility bills due to having limited income   Collaborated with embedded BSW with notification of patient's concerns and need for further assistance with resources for food and cost of utilities  Discussed plans with patient for ongoing care management follow up and provided patient with direct contact information for care management team  Patient Self Care Activities:   Patient verbalizes understanding of plan to contact SW as needed to assist with benefit access  Calls pharmacy for medication refills  Performs ADL's independently  Performs IADL's independently  Please see past updates related to this goal by clicking on the "Past Updates" button in the selected goal          Follow Up Plan: SW will follow up with patient by phone over the next week.   Daneen Schick, BSW, CDP Social Worker, Certified Dementia Practitioner [place] [facility] [place] [facility] [phone]  Total time spent performing care coordination and/or care management activities with the patient by phone or face to face = 25 minutes.

## 2020-09-07 NOTE — Patient Instructions (Signed)
Social Worker Visit Information  Goals we discussed today:  Goals Addressed            This Visit's Progress    Collaborate with Consulting civil engineer and embedded PharmD to perform appropriate assessments to manage long-term care coordination needs   On track    [place] (see longitudinal plan of care for additional care plan information)  Current Barriers:   Limited social support  Language barrier  Limited understanding of health plan benefits to address patient social determinants of health  Chronic conditions including DM II and HTN which put patient at increased risk of hospitalization  Social Work Clinical Goal(s):   Over the next 6 months the patient will work with SW to develop a better understanding of patient health plan benefits   CCM SW Interventions: Completed 09/07/20  Successful outbound call placed to the patient to assess goal progression  Determined the patient was successful in purchasing food under her [place] card at [place] patient OTC benefit and opportunity to access $700 worth of savings through OTC catalog by the end of 2021 o Patient reports she has yet to receive OTC catalog via mail  Advised the patient SW would obtain a catalog and print for her to pick up from the practice at an [place] appointment  Collaboration with colleague to obtain [facility] Dual plan OTC catalog  Scheduled follow up over the next month  Completed 08/27/20  Successful outbound call placed to the patient to assess goal progression  Determined the patient has yet to successfully access healthy food benefit   Joint call placed to the patients health plan to discuss concern over the patients inability to purchase health foods with her benefits card o Transferred to [employer] the vendor who manages Health Foods o Informed by [employer] rep the patients card is active and should be working o Discussed the patient has tried on 3 separate occassions to  buy healthy foods such as fruits, vegetables, and cereals but the card was declined o Informed by [employer] "you can not buy soda, candy, or lunch ,meat with the card because that is not healthy" o Reiterated the patient is attempting to purchase healthy food and the card is being declined o Advised by [place] to "try again" and if it happens again to call back  Advised the patient to try again with her card sticking to just fruits and vegetables to see if successful  Scheduled follow up call over the next 3 weeks  Completed 08/10/20  Successful outbound call placed to the patient to assess goal progression  Determined the patient received an updated [employer] benefit card in the mail o The patient reports "this card still gets declined"  Attempted to assist the patient in contacting Healthy Foods program to troubleshoot reasons the card is declined o The patient reported "I do not have the card with me right now"  Encouraged the patient to contact customer service herself or contact SW when she is ready to make a joint call  Scheduled follow up call over the next two weeks  Completed 07/27/20  Successful outbound call placed to the patient to discuss food insecurity   Patient reports she does have benefits under food and nutrition services that help supplement food needs  Discussed patient "Healthy Food" benefit offered under [facility] health plan benefit o Patient reports she has tried to access this benefit in the past but the card was always declined o Determined the patient  has contacted [facility] to request a new card be mailed to her home  Scheduled follow up call over the next two weeks to confirm receipt of new card  Completed 07/02/20  Inter-disciplinary care team collaboration (see longitudinal plan of care)  Patient interviewed and appropriate assessments performed  Assisted the patient in contacting her health plan to obtain benefit information  Determined the patient has  [facility] Dual Complete with benefits including Healthy Food Program, OTC benefit, and transportation  [facility] o Patient receives $55 per month on a pre-loaded card to access healthy food options at the grocery store o Confirmed the patient currently has a balance of $55 to be used by the end of August o Educated the patient on benefit and how to access while at the store o Encouraged the patient to access this benefit by the end of the month prior to losing funds for the month of August- the patient stated understanding  OTC Benefit o The patient is awarded $300 every quarter to be used on over the counter products listed in OTC Catalog o This benefit rolls over from month to month until the end of the plan year. Patient current balance of $550.83 with another $300 set to be awarded in October.  o Patient advised any credit not used by November 27 2020 will not roll over the 2022 plan year o Confirmed the patient does have an OTC Catalog in her possession o Advised the patient to look through the catalog to determine products she would like to order to use in her home o Encouraged the patient to order items she may not need at this time but could utilize in the future, such as incontinent supplies, in order to access her benefit prior to losing at the end of 2021- patient stated understanding  Transportation o Patient has access to transportation services for medical trips within a 50 mile radius of her home o Educated the patient how to access this benefit as well as the need to schedule within a 3 business day timeframe - patient stated understanding  Advised the patient SW will outreach over the next month to review benefits with the patient and assist with access as needed  Provided the patient with SW direct contact number   Collaboration with Consulting civil engineer and embedded PharmD to inform of above interventions and plan for SW follow up  CCM RN CM Interventions:  07/09/20 call  completed with patient   Determined patient is running low on food each month prior to getting her food stamps on the 15 th of each month  Determined she is also experiencing hardship paying her utility bills due to having limited income   Collaborated with embedded BSW with notification of patient's concerns and need for further assistance with resources for food and cost of utilities  Discussed plans with patient for ongoing care management follow up and provided patient with direct contact information for care management team  Patient Self Care Activities:   Patient verbalizes understanding of plan to contact SW as needed to assist with benefit access  Calls pharmacy for medication refills  Performs ADL's independently  Performs IADL's independently  Please see past updates related to this goal by clicking on the "Past Updates" button in the selected goal          Follow Up Plan: SW will follow up with patient by phone over the next week.   Daneen Schick, BSW, CDP Social Worker, Teacher, adult education /  [facility] Care Management [phone]

## 2020-09-08 ENCOUNTER — Telehealth: Payer: Medicare Other

## 2020-09-14 ENCOUNTER — Telehealth: Payer: Medicare Other

## 2020-09-15 ENCOUNTER — Telehealth: Payer: Self-pay | Admitting: Pharmacist

## 2020-09-15 NOTE — Chronic Care Management (AMB) (Signed)
Chronic Care Management Pharmacy Assistant   Name: Tonisha Silvey  MRN: [id number] DOB: [birth year]  Reason for Encounter: Medication Review - Patient Assistance Coordination   PCP : [name], FNP  Allergies:   Allergies  Allergen Reactions  . Aspirin Other (See Comments)    insomnia  . Hydroxyzine Other (See Comments)    insomnia  . Valium [Diazepam] Other (See Comments)    Insomnia, 05/10/16 pt states she took as young person- made her depressed    Medications: Outpatient Encounter Medications as of 09/15/2020  Medication Sig  . acetaminophen (TYLENOL) 500 MG tablet Take 1 tablet (500 mg total) by mouth every 6 (six) hours as needed for mild pain, moderate pain or headache. Reported on 05/10/2016 (Patient not taking: Reported on 05/27/2020)  . acetaminophen (TYLENOL) 650 MG CR tablet Take 1,300 mg by mouth in the morning. More in the evening if needed  . Alcohol Swabs (ALCOHOL WIPES) 70 % PADS by Does not apply route. Use with blood sugar check and injection of insulin (Patient not taking: Reported on 05/27/2020)  . blood glucose meter kit and supplies KIT Dispense based on patient and insurance preference. Use up to four times daily as directed. (FOR ICD-9 250.00, 250.01).  . Cholecalciferol (VITAMIN D3) 25 MCG (1000 UT) CAPS Take 1 capsule by mouth daily.  . clonazePAM (KLONOPIN) 0.5 MG tablet Take 0.5 mg by mouth at bedtime.   . diclofenac Sodium (VOLTAREN) 1 % GEL Apply 2 g topically 4 (four) times daily.  [name] glucose blood test strip Use as instructed to check blood sugars 3 times a day. Dx code: e11.65  . latanoprost (XALATAN) 0.005 % ophthalmic solution Place 1 drop into both eyes at bedtime.  . metFORMIN (GLUCOPHAGE) 500 MG tablet Take 1 tablet (500 mg total) by mouth daily after lunch.  . olmesartan (BENICAR) 20 MG tablet TAKE 1 TABLET (20 MG TOTAL) BY MOUTH DAILY.   No facility-administered encounter medications on file as of 09/15/2020.    Current  Diagnosis: Patient Active Problem List   Diagnosis Date Noted  . Primary open angle glaucoma (POAG) of both eyes, severe stage 07/16/2020  . Inguinal hernia with bowel obstruction 08/07/2019  . Incarcerated left inguinal hernia 08/07/2019  . Anemia 08/31/2018  . Essential hypertension 07/03/2018  . Type 2 diabetes mellitus without complication, without long-term current use of insulin ([place]) 07/02/2018  . Grief reaction 10/20/2013  . Obesity 10/20/2013  . ALLERGIC RHINITIS 08/11/2009  . INSOMNIA 07/17/2008  . TIREDNESS 04/16/2008  . HYPERTENSION 01/24/2008      Follow-Up:  Patient Assistance Coordination - Patient is in need of a blood pressure cuff , and needs to know where she can get one with her insurance plan.  09/15/2020 Called [employer] -  They do not bill insurance for blood pressure cuffs. They told me to call [place].  [facility] - They do not bill insurance for blood pressure cuffs. I was  told to call [facility] .  [facility] - They do not bill insurance for blood pressure cuffs .  I was told to call [facility] .  Called [facility] - They do not bill insurance medicare ,only medicaid , told me to call number on back of insurance card to see where they recommend patient to get blood pressure.  Called insurance spoke with Gilmore from [facility] about  patient assistance with blood pressure cuff.  Unable to  help me states I need to call back he was having computer problems and could not transfer me.   Called [employer] to get information for patient assistance with blood pressure cuff , not able to help me with how process works just told me about 20 % off .  09/17/2020 - Called patient to let her know that I can not find a local place that will bill her insurance to help pay for a blood pressure  monitor,  [facility] does have blood pressure cuffs starting at 29.99 .  Patient voiced understanding.  Beverly Milch , CPP notified   Judithann Sheen, [facility] Clinical Pharmacist Assistant [phone]

## 2020-09-16 ENCOUNTER — Telehealth: Payer: Self-pay

## 2020-09-16 ENCOUNTER — Telehealth: Payer: Medicare Other

## 2020-09-16 NOTE — Telephone Encounter (Signed)
  Chronic Care Management   Outreach Note  09/16/2020 Name: Allison Franco MRN: [id number] DOB: [birth year]  Referred by: [name], FNP Reason for referral : Care Coordination   SW placed an unsuccessful outbound call to remind the patient to pick up OTC Catalog during appointment on 09/17/20.  Follow Up Plan: A HIPAA compliant phone message was left for the patient providing contact information and requesting a return call.  The care management team will reach out to the patient again over the next 21 days.   SIGNATURE

## 2020-09-17 ENCOUNTER — Ambulatory Visit: Payer: Medicare (Managed Care) | Admitting: Nurse Practitioner

## 2020-09-18 ENCOUNTER — Ambulatory Visit: Payer: Self-pay

## 2020-09-18 ENCOUNTER — Telehealth: Payer: Medicare Other

## 2020-09-18 ENCOUNTER — Other Ambulatory Visit: Payer: Self-pay

## 2020-09-18 DIAGNOSIS — G8929 Other chronic pain: Secondary | ICD-10-CM

## 2020-09-18 DIAGNOSIS — E119 Type 2 diabetes mellitus without complications: Secondary | ICD-10-CM

## 2020-09-18 DIAGNOSIS — I10 Essential (primary) hypertension: Secondary | ICD-10-CM

## 2020-09-18 DIAGNOSIS — M25561 Pain in right knee: Secondary | ICD-10-CM

## 2020-09-18 NOTE — Chronic Care Management (AMB) (Signed)
Chronic Care Management   Follow Up Note   09/18/2020 Name: Allison Franco MRN: [id number] DOB: [birth year]  Referred by: [name], FNP Reason for referral : Chronic Care Management (RN Case Collaboration )   Allison Franco is a 79 y.o. year old female who is a primary care patient of [name], Eugenio Saenz. The CCM team was consulted for assistance with chronic disease management and care coordination needs.    Review of patient status, including review of consultants reports, relevant laboratory and other test results, and collaboration with appropriate care team members and the patient's provider was performed as part of comprehensive patient evaluation and provision of chronic care management services.    SDOH (Social Determinants of Health) assessments performed: No See Care Plan activities for detailed interventions related to [facility])     Outpatient Encounter Medications as of 09/18/2020  Medication Sig   acetaminophen (TYLENOL) 500 MG tablet Take 1 tablet (500 mg total) by mouth every 6 (six) hours as needed for mild pain, moderate pain or headache. Reported on 05/10/2016 (Patient not taking: Reported on 05/27/2020)   acetaminophen (TYLENOL) 650 MG CR tablet Take 1,300 mg by mouth in the morning. More in the evening if needed   Alcohol Swabs (ALCOHOL WIPES) 70 % PADS by Does not apply route. Use with blood sugar check and injection of insulin (Patient not taking: Reported on 05/27/2020)   blood glucose meter kit and supplies KIT Dispense based on patient and insurance preference. Use up to four times daily as directed. (FOR ICD-9 250.00, 250.01).   Cholecalciferol (VITAMIN D3) 25 MCG (1000 UT) CAPS Take 1 capsule by mouth daily.   clonazePAM (KLONOPIN) 0.5 MG tablet Take 0.5 mg by mouth at bedtime.    diclofenac Sodium (VOLTAREN) 1 % GEL Apply 2 g topically 4 (four) times daily.   glucose blood test strip Use as instructed to check blood sugars 3 times a day. Dx code: e11.65    latanoprost (XALATAN) 0.005 % ophthalmic solution Place 1 drop into both eyes at bedtime.   metFORMIN (GLUCOPHAGE) 500 MG tablet Take 1 tablet (500 mg total) by mouth daily after lunch.   olmesartan (BENICAR) 20 MG tablet TAKE 1 TABLET (20 MG TOTAL) BY MOUTH DAILY.   No facility-administered encounter medications on file as of 09/18/2020.     Objective:  Lab Results  Component Value Date   HGBA1C 6.3 (H) 05/18/2020   HGBA1C 6.4 (H) 01/15/2020   HGBA1C 6.6 (H) 08/06/2019   Lab Results  Component Value Date   LDLCALC 99 01/15/2020   CREATININE 0.86 05/18/2020   BP Readings from Last 3 Encounters:  05/18/20 136/86  04/05/20 131/73  01/15/20 126/84    Goals Addressed      Patient Stated     "to get my BP under good control" (pt-stated)        CARE PLAN ENTRY (see longitudinal plan of care for additional care plan information)  Current Barriers:   Knowledge Deficits related to disease process and Self Health management of Hypertension  Chronic Disease Management support and education needs related to DMII, HTN, Chronic Pain of both knees  Lacks caregiver support.   Film/video editor.   Difficulty obtaining medications  Nurse Case Manager Clinical Goal(s):   Over the next 90 days, patient will work with the CCM team and PCP to address needs related to disease education and support to improve Self Health management of HTN  CCM RN CM Interventions:  07/09/20 call completed with patient  Inter-disciplinary care team collaboration (see longitudinal plan of care)  Evaluation of current treatment plan related to HTN and patient's adherence to plan as established by provider.  Provided education to patient re: low Sodium dietary and exercise recommendations for improved management of HTN; Educated on target BP <130/80; Determined patient does have a BP cuff at home but gets anxious when she Self monitors and has a high reading; Discussed patient will bring her BP  cuff at next appointment for a cuff check to ensure her cuff is properly working   Reviewed medications with patient and discussed patient is adhering to taking her BP medications as directed w/o missed doses   Discussed plans with patient for ongoing care management follow up and provided patient with direct contact information for care management team  Advised patient, providing education and rationale, to monitor blood pressure daily and record, calling the CCM team and or PCP for findings outside established parameters.   Provided patient with printed educational materials related to What is High Blood Pressure?; Why Should I Lower Sodium? 09/18/20 RN CM Case Collaboration   Placed outbound call to [employer] [phone], spoke with Katharine Look  Determined this pharmacy will bill the patient's Medicare for purchase of a BP cuff  Determined the PCP can fax the patients demographics, insurance information and Rx for the BP cuff  Determined this information should be faxed to the DME department at fax [phone]  Sent in basket message to PCP provider [name], FNP requesting the Rx a BP be sent per directions provided   Attempted unsuccessful call to patient to convey the information concerning the Rx for a BP cuff and PCP request that was sent to send this information to [facility]  Patient Self Care Activities:   Self administers medications as prescribed  Attends all scheduled provider appointments  Calls pharmacy for medication refills  Performs ADL's independently  Performs IADL's independently  Calls provider office for new concerns or questions  Please see past updates related to this goal by clicking on the "Past Updates" button in the selected goal         Plan:   Telephone follow up appointment with care management team member scheduled for: 10/12/20  Barb Merino, RN, BSN, CCM Care Management Coordinator [place] Management/[facility]  Direct Phone: [phone]

## 2020-10-05 ENCOUNTER — Telehealth: Payer: Medicare Other

## 2020-10-12 ENCOUNTER — Telehealth: Payer: Medicare Other

## 2020-10-13 ENCOUNTER — Telehealth: Payer: Self-pay | Admitting: *Deleted

## 2020-10-13 NOTE — Chronic Care Management (AMB) (Signed)
  Care Management   Note  10/13/2020 Name: [name] MRN: [id number] DOB: [birth year]  Allison Franco is a 79 y.o. year old female who is a primary care patient of [name], [place] and is actively engaged with the care management team. I reached out to [name] by phone today to assist with re-scheduling a follow up visit with the Pharmacist  Follow up plan: Unsuccessful telephone outreach attempt made. A HIPAA compliant phone message was left for the patient providing contact information and requesting a return call.  The care management team will reach out to the patient again over the next 7 days.  If patient returns call to provider office, please advise to call [place] at [phone].  [place] Management

## 2020-10-19 ENCOUNTER — Telehealth: Payer: Self-pay

## 2020-10-19 ENCOUNTER — Telehealth: Payer: Medicare Other

## 2020-10-19 NOTE — Telephone Encounter (Signed)
°  Chronic Care Management   Outreach Note  10/19/2020 Name: Allison Franco MRN: [id number] DOB: [birth year]  Referred by: [name], FNP Reason for referral : Care Coordination   An unsuccessful telephone outreach was attempted today. The patient was referred to the case management team for assistance with care management and care coordination.   Follow Up Plan: A HIPAA compliant phone message was left for the patient providing contact information and requesting a return call.  The care management team will reach out to the patient again over the next 60 days.   Daneen Schick, BSW, CDP Social Worker, Certified Dementia Practitioner [place] / [place] Management [phone]

## 2020-10-20 ENCOUNTER — Telehealth: Payer: Medicare Other

## 2020-10-20 NOTE — Chronic Care Management (AMB) (Signed)
  Care Management   Note  10/20/2020 Name: Allison Franco MRN: [id number] DOB: 1941/07/19  Allison Franco is a 79 y.o. year old female who is a primary care patient of [name], [place] and is actively engaged with the care management team. I reached out to [name] by phone today to assist with re-scheduling a follow up visit with the Pharmacist  Follow up plan: Telephone appointment with care management team member scheduled for:12/09/2019  [place] Management  Direct Dial: [phone]

## 2020-10-28 ENCOUNTER — Ambulatory Visit: Payer: Medicare Other

## 2020-10-28 ENCOUNTER — Ambulatory Visit: Payer: Medicare Other | Attending: Internal Medicine

## 2020-10-28 ENCOUNTER — Other Ambulatory Visit (HOSPITAL_COMMUNITY): Payer: Self-pay | Admitting: Internal Medicine

## 2020-10-28 DIAGNOSIS — Z23 Encounter for immunization: Secondary | ICD-10-CM

## 2020-10-28 NOTE — Progress Notes (Unsigned)
   Covid-19 Vaccination Clinic  Name:  Allison Franco    MRN: [id number] DOB: August 20, 1941  10/28/2020  Allison Franco was observed post Covid-19 immunization for 15 minutes without incident. She was provided with Vaccine Information Sheet and instruction to access the V-Safe system.   Allison Franco was instructed to call 911 with any severe reactions post vaccine: [name] Difficulty breathing  . Swelling of face and throat  . A fast heartbeat  . A bad rash all over body  . Dizziness and weakness

## 2020-10-28 NOTE — Progress Notes (Signed)
   Covid-19 Vaccination Clinic  Name:  Allison Franco    MRN: [id number] DOB: 1941/02/03  10/28/2020  Ms. Bear was observed post Covid-19 immunization for 15 minutes without incident. She was provided with Vaccine Information Sheet and instruction to access the V-Safe system.   [name] was instructed to call 911 with any severe reactions post vaccine: [name] Difficulty breathing  . Swelling of face and throat  . A fast heartbeat  . A bad rash all over body  . Dizziness and weakness   Immunizations Administered    Name Date Dose VIS Date Route   Pfizer COVID-19 Vaccine 10/28/2020 10:07 AM 0.3 mL 09/16/2020 Intramuscular   Manufacturer: [place]   Lot: [id number]   [place]: [id number]

## 2020-11-10 ENCOUNTER — Telehealth: Payer: Medicare Other

## 2020-11-10 ENCOUNTER — Ambulatory Visit: Payer: Self-pay

## 2020-11-10 ENCOUNTER — Other Ambulatory Visit: Payer: Self-pay

## 2020-11-10 DIAGNOSIS — I10 Essential (primary) hypertension: Secondary | ICD-10-CM

## 2020-11-10 DIAGNOSIS — E119 Type 2 diabetes mellitus without complications: Secondary | ICD-10-CM

## 2020-11-10 DIAGNOSIS — G8929 Other chronic pain: Secondary | ICD-10-CM

## 2020-11-11 ENCOUNTER — Ambulatory Visit (INDEPENDENT_AMBULATORY_CARE_PROVIDER_SITE_OTHER): Payer: Medicare Other | Admitting: Nurse Practitioner

## 2020-11-11 ENCOUNTER — Encounter: Payer: Self-pay | Admitting: Nurse Practitioner

## 2020-11-11 VITALS — BP 116/80 | HR 87 | Temp 98.1°F | Ht 61.2 in | Wt 179.0 lb

## 2020-11-11 DIAGNOSIS — Z23 Encounter for immunization: Secondary | ICD-10-CM | POA: Diagnosis not present

## 2020-11-11 DIAGNOSIS — R519 Headache, unspecified: Secondary | ICD-10-CM

## 2020-11-11 DIAGNOSIS — E119 Type 2 diabetes mellitus without complications: Secondary | ICD-10-CM | POA: Diagnosis not present

## 2020-11-11 DIAGNOSIS — I10 Essential (primary) hypertension: Secondary | ICD-10-CM

## 2020-11-11 DIAGNOSIS — I700 Atherosclerosis of aorta: Secondary | ICD-10-CM

## 2020-11-11 MED ORDER — PNEUMOCOCCAL 13-VAL CONJ VACC IM SUSP: 0.5000 mL | INTRAMUSCULAR | 0 refills | Status: AC

## 2020-11-11 MED ORDER — TETANUS-DIPHTH-ACELL PERTUSSIS 5-2.5-18.5 LF-MCG/0.5 IM SUSP: 0.5000 mL | Freq: Once | INTRAMUSCULAR | 0 refills | Status: AC

## 2020-11-11 NOTE — Progress Notes (Signed)
I,Allison Franco as a Education administrator for [employer], FNP.,have documented all relevant documentation on the behalf of Allison Brine, FNP,as directed by  Allison Brine, FNP while in the presence of Allison Franco, [name]. This visit occurred during the SARS-CoV-2 public health emergency.  Safety protocols were in place, including screening questions prior to the visit, additional usage of staff PPE, and extensive cleaning of exam room while observing appropriate contact time as indicated for disinfecting solutions.  Subjective:     Patient ID: Allison Franco , female    DOB: April 28, 1941 , 79 y.o.   MRN: [id number]   Chief Complaint  Patient presents with  . Headache    HPI  Patient stated she is here for headaches. She had weave on at the time, after this was removed she no longer had a headache.  She was advised by [place] Allison Franco to be seen today.    Headache  This is a new problem. The current episode started yesterday. The problem occurs intermittently. The problem has been resolved. Pertinent negatives include no coughing or dizziness. Nothing aggravates the symptoms.     Past Medical History:  Diagnosis Date  . Anxiety   . Constipation   . Depression    "following husband's death"  . Diabetes mellitus without complication ([place])   . Hypertension   . KNEE PAIN, BILATERAL 02/13/2009   Qualifier: Diagnosis of  By: Hassell Done FNP, Tori Milks    . Tinnitus      Family History  Problem Relation Age of Onset  . Diabetes Mother   . Hypertension Father      Current Outpatient Medications:  .  acetaminophen (TYLENOL) 650 MG CR tablet, Take 1,300 mg by mouth in the morning. More in the evening if needed, Disp: , Rfl:  .  blood glucose meter kit and supplies KIT, Dispense based on patient and insurance preference. Use up to four times daily as directed. (FOR ICD-9 250.00, 250.01)., Disp: 1 each, Rfl: 0 .  Cholecalciferol (VITAMIN D3) 25 MCG (1000 UT) CAPS, Take 1 capsule by mouth daily., Disp: , Rfl:   .  clonazePAM (KLONOPIN) 0.5 MG tablet, Take 0.5 mg by mouth at bedtime. , Disp: , Rfl:  .  diclofenac Sodium (VOLTAREN) 1 % GEL, Apply 2 g topically 4 (four) times daily., Disp: 100 g, Rfl: 2 .  glucose blood test strip, Use as instructed to check blood sugars 3 times a day. Dx code: e11.65, Disp: 100 each, Rfl: 12 .  latanoprost (XALATAN) 0.005 % ophthalmic solution, Place 1 drop into both eyes at bedtime., Disp: , Rfl:  .  metFORMIN (GLUCOPHAGE) 500 MG tablet, Take 1 tablet (500 mg total) by mouth daily after lunch., Disp: 90 tablet, Rfl: 1 .  olmesartan (BENICAR) 20 MG tablet, TAKE 1 TABLET (20 MG TOTAL) BY MOUTH DAILY., Disp: 90 tablet, Rfl: 1 .  acetaminophen (TYLENOL) 500 MG tablet, Take 1 tablet (500 mg total) by mouth every 6 (six) hours as needed for mild pain, moderate pain or headache. Reported on 05/10/2016 (Patient not taking: No sig reported), Disp: 30 tablet, Rfl: 0 .  Alcohol Swabs (ALCOHOL WIPES) 70 % PADS, by Does not apply route. Use with blood sugar check and injection of insulin (Patient not taking: Reported on 05/27/2020), Disp: , Rfl:  .  pneumococcal 13-valent conjugate vaccine (PREVNAR 13) SUSP injection, Inject 0.5 mLs into the muscle tomorrow at 10 am for 1 dose., Disp: 0.5 mL, Rfl: 0 .  Tdap (BOOSTRIX) 5-2.5-18.5 LF-MCG/0.5 injection, Inject 0.5 mLs  into the muscle once for 1 dose., Disp: 0.5 mL, Rfl: 0   Allergies  Allergen Reactions  . Aspirin Other (See Comments)    insomnia  . Hydroxyzine Other (See Comments)    insomnia  . Valium [Diazepam] Other (See Comments)    Insomnia, 05/10/16 pt states she took as young person- made her depressed     Review of Systems  Constitutional: Negative.   Respiratory: Negative.  Negative for cough.   Cardiovascular: Negative.  Negative for chest pain, palpitations and leg swelling.  Neurological: Positive for headaches. Negative for dizziness.  Psychiatric/Behavioral: Negative.      Today's Vitals   11/11/20 1050  BP:  116/80  Pulse: 87  Temp: 98.1 F (36.7 C)  TempSrc: Oral  Weight: 179 lb (81.2 kg)  Height: 5' 1.2" (1.554 m)  PainSc: 0-No pain   Body mass index is 33.6 kg/m.   Objective:  Physical Exam Vitals reviewed.  Constitutional:      General: She is not in acute distress.    Appearance: Normal appearance.  Cardiovascular:     Rate and Rhythm: Normal rate and regular rhythm.     Pulses: Normal pulses.     Heart sounds: Normal heart sounds. No murmur heard.   Pulmonary:     Effort: Pulmonary effort is normal. No respiratory distress.     Breath sounds: Normal breath sounds.  Skin:    General: Skin is warm and dry.     Capillary Refill: Capillary refill takes less than 2 seconds.  Neurological:     General: No focal deficit present.     Mental Status: She is alert and oriented to person, place, and time.  Psychiatric:        Mood and Affect: Mood normal. Mood is not anxious.        Behavior: Behavior normal.        Thought Content: Thought content normal.        Judgment: Judgment normal.         Assessment And Plan:     1. Essential hypertension  Chronic, excellent control  Continue current medications - BMP8+eGFR  2. Type 2 diabetes mellitus without complication, without long-term current use of insulin (HCC) Chronic, blood sugar has been under good control Continue with metformin as directed Will check HgbA1c - Hemoglobin A1c - BMP8+eGFR  3. Acute nonintractable headache, unspecified headache type This has resolved  4. Encounter for immunization - pneumococcal 13-valent conjugate vaccine (PREVNAR 13) SUSP injection; Inject 0.5 mLs into the muscle tomorrow at 10 am for 1 dose.  Dispense: 0.5 mL; Refill: 0 - Tdap (BOOSTRIX) 5-2.5-18.5 LF-MCG/0.5 injection; Inject 0.5 mLs into the muscle once for 1 dose.  Dispense: 0.5 mL; Refill: 0  5. Need for influenza vaccination  Influenza vaccine administered  Encouraged to take Tylenol as needed for fever or muscle  aches. - Flu Vaccine QUAD High Dose(Fluad)  6. Aortic atherosclerosis (HCC)  this is being managed with diet, will add lipid panel  I have given her names of dentist that may take medicare    Patient was given opportunity to ask questions. Patient verbalized understanding of the plan and was able to repeat key elements of the plan. All questions were answered to their satisfaction.    [name], FNP, have reviewed all documentation for this visit. The documentation on 11/11/20 for the exam, diagnosis, procedures, and orders are all accurate and complete.  THE PATIENT IS ENCOURAGED TO PRACTICE SOCIAL DISTANCING DUE TO  THE COVID-19 PANDEMIC.

## 2020-11-12 LAB — BMP8+EGFR
BUN/Creatinine Ratio: 19 (ref 12–28)
BUN: 16 mg/dL (ref 8–27)
CO2: 25 mmol/L (ref 20–29)
Calcium: 8.9 mg/dL (ref 8.7–10.3)
Chloride: 102 mmol/L (ref 96–106)
Creatinine, Ser: 0.83 mg/dL (ref 0.57–1.00)
GFR calc Af Amer: 78 mL/min/{1.73_m2} (ref >59)
GFR calc non Af Amer: 67 mL/min/{1.73_m2} (ref >59)
Glucose: 103 mg/dL — ABNORMAL HIGH (ref 65–99)
Potassium: 4.6 mmol/L (ref 3.5–5.2)
Sodium: 139 mmol/L (ref 134–144)

## 2020-11-12 LAB — HEMOGLOBIN A1C
Est. average glucose Bld gHb Est-mCnc: 123 mg/dL
Hgb A1c MFr Bld: 5.9 % — ABNORMAL HIGH (ref 4.8–5.6)

## 2020-11-16 LAB — LIPID PANEL
Chol/HDL Ratio: 4.2 ratio (ref 0.0–4.4)
Cholesterol, Total: 168 mg/dL (ref 100–199)
HDL: 40 mg/dL (ref >39)
LDL Chol Calc (NIH): 100 mg/dL — ABNORMAL HIGH (ref 0–99)
Triglycerides: 159 mg/dL — ABNORMAL HIGH (ref 0–149)
VLDL Cholesterol Cal: 28 mg/dL (ref 5–40)

## 2020-11-16 LAB — SPECIMEN STATUS REPORT

## 2020-11-16 NOTE — Patient Instructions (Signed)
Visit Information  Goals Addressed      Other   .  Cope with Pain-Osteoarthritis        Timeframe:  Long-Range Goal Priority:  High Start Date:  11/10/20                          Expected End Date:  02/08/21                 Follow Up Date 01/21/21   - use distraction techniques - use relaxation during pain    Why is this important?    Living with joint pain and enjoying your life may be hard.   Feelings like depression or anger can make your pain worse.   Learning ways to cope may help you find some relief from the pain.    Notes:     [name]  Make and Keep All Appointments        Timeframe:  Long-Range Goal Priority:  High Start Date: 11/10/20                        Expected End Date: 02/08/21                     Follow Up Date 01/21/21    - arrange a ride through an agency 1 week before appointment - keep a calendar with appointment dates    Why is this important?    Part of staying healthy is seeing the doctor for follow-up care.   If you forget your appointments, there are some things you can do to stay on track.    Notes:     [name]  Mat[place]ain My Quality of Life        Timeframe:  Long-Range Goal Priority:  High Start Date: 11/10/20                            Expected End Date: 02/08/21                      Follow Up Date  01/21/21   - keep all MD appointments when scheduled - take all medications exactly as prescribed - call MD office to report concerns or change in condition  - discuss my treatment options with the doctor or nurse - make shared treatment decisions with doctor - stay active and get plenty of sleep    Why is this important?    Having a long-term illness can be scary.   It can also be stressful for you and your caregiver.   These steps may help.    Notes:     [name]  Monitor and Manage My Blood Sugar-Diabetes Type 2        Timeframe:  Long-Range Goal Priority:  High Start Date: 11/10/20                            Expected End Date:  02/08/21                      Follow Up Date 01/21/21  - Self administers oral medications as prescribed - Attends all scheduled provider appointments - Checks blood sugars as prescribed and utilize hyper and hypoglycemia protocol as needed - Adheres to prescribed ADA/carb modified - check blood sugar before and after exercise -  check blood sugar if I feel it is too high or too low - take the blood sugar log to all doctor visits - take the blood sugar meter to all doctor visits    Why is this important?    Checking your blood sugar at home helps to keep it from getting very high or very low.   Writing the results in a diary or log helps the doctor know how to care for you.   Your blood sugar log should have the time, date and the results.   Also, write down the amount of insulin or other medicine that you take.   Other information, like what you ate, exercise done and how you were feeling, will also be helpful.     Notes:     .  Track and Manage My Blood Pressure-Hypertension        Timeframe:  Long-Range Goal Priority:  High Start Date: 11/10/20                            Expected End Date:  02/08/21                     Follow Up Date 01/21/21    - check blood pressure 3 times per week - write blood pressure results in a log or diary  - Self administers medications as prescribed - Attends all scheduled provider appointments - Calls provider office for new concerns, questions, or BP outside discussed parameters - Follows a low sodium diet/DASH diet   Why is this important?    You won't feel high blood pressure, but it can still hurt your blood vessels.   High blood pressure can cause heart or kidney problems. It can also cause a stroke.   Making lifestyle changes like losing a  weight or eating less salt will help.   Checking your blood pressure at home and at different times of the day can help to control blood pressure.   If the doctor prescribes medicine remember to take  it the way the doctor ordered.   Call the office if you cannot afford the medicine or if there are questions about it.     Notes:        The patient verbalized understanding of instructions, educational materials, and care plan provided today and declined offer to receive copy of patient instructions, educational materials, and care plan.   Telephone follow up appointment with care management team member scheduled for: 01/21/21  Lynne Logan, RN

## 2020-11-16 NOTE — Chronic Care Management (AMB) (Signed)
Chronic Care Management   Follow Up Note   11/10/2020 Name: Allison Franco MRN: [id number] DOB: 02-08-1941  Referred by: [name], FNP Reason for referral : Chronic Care Management (RN CM FU Call )   Allison Franco is a 79 y.o. year old female who is a primary care patient of [name], [place]. The CCM team was consulted for assistance with chronic disease management and care coordination needs.    Review of patient status, including review of consultants reports, relevant laboratory and other test results, and collaboration with appropriate care team members and the patient's provider was performed as part of comprehensive patient evaluation and provision of chronic care management services.    SDOH (Social Determinants of Health) assessments performed: Yes See Care Plan activities for detailed interventions related to [place])   Placed outbound CCM RN CM follow up call to patient for a care plan update.    Outpatient Encounter Medications as of 11/10/2020  Medication Sig  . acetaminophen (TYLENOL) 500 MG tablet Take 1 tablet (500 mg total) by mouth every 6 (six) hours as needed for mild pain, moderate pain or headache. Reported on 05/10/2016 (Patient not taking: No sig reported)  . acetaminophen (TYLENOL) 650 MG CR tablet Take 1,300 mg by mouth in the morning. More in the evening if needed  . Alcohol Swabs (ALCOHOL WIPES) 70 % PADS by Does not apply route. Use with blood sugar check and injection of insulin (Patient not taking: Reported on 05/27/2020)  . blood glucose meter kit and supplies KIT Dispense based on patient and insurance preference. Use up to four times daily as directed. (FOR ICD-9 250.00, 250.01).  . Cholecalciferol (VITAMIN D3) 25 MCG (1000 UT) CAPS Take 1 capsule by mouth daily.  . clonazePAM (KLONOPIN) 0.5 MG tablet Take 0.5 mg by mouth at bedtime.   . diclofenac Sodium (VOLTAREN) 1 % GEL Apply 2 g topically 4 (four) times daily.  [name] glucose blood test strip Use as  instructed to check blood sugars 3 times a day. Dx code: e11.65  . latanoprost (XALATAN) 0.005 % ophthalmic solution Place 1 drop into both eyes at bedtime.  . metFORMIN (GLUCOPHAGE) 500 MG tablet Take 1 tablet (500 mg total) by mouth daily after lunch.  . olmesartan (BENICAR) 20 MG tablet TAKE 1 TABLET (20 MG TOTAL) BY MOUTH DAILY.   No facility-administered encounter medications on file as of 11/10/2020.     Objective:  Lab Results  Component Value Date   HGBA1C 5.9 (H) 11/11/2020   HGBA1C 6.3 (H) 05/18/2020   HGBA1C 6.4 (H) 01/15/2020   Lab Results  Component Value Date   LDLCALC 100 (H) 11/11/2020   CREATININE 0.83 11/11/2020   BP Readings from Last 3 Encounters:  11/11/20 116/80  05/18/20 136/86  04/05/20 131/73    Goals Addressed     Patient Care Plan: General Plan of Care (Adult)    Problem Identified: Quality of Life (General Plan of Care)   Priority: High    Long-Range Goal: Quality of Life Maintained   Start Date: 11/10/2020  Expected End Date: 02/08/2021  This Visit's Progress: On track  Priority: High  Note:   Current Barriers:   Ineffective Self Health Maintenance  Lacks social connections  Currently UNABLE TO independently self manage needs related to chronic health conditions.   Knowledge Deficits related to short term plan for care coordination needs and long term plans for chronic disease management needs Nurse Case Manager Clinical Goal(s):   Over the next 90  days, patient will work with care management team to address care coordination and chronic disease management needs related to Disease Management  Educational Needs  Care Coordination  Medication Management and Education  Psychosocial Support   Interventions:  . Identify sensory issues that impact quality of life such as hearing loss, vision deficit; strategize ways to maintain or improve hearing, vision.  . Promote activities to decrease social isolation such as group support or  social, leisure and recreational activities, employment, use of social media; consider safety concerns about being out of home for activities.  . Promote access to services in the community to support independence such as support groups, home visiting programs, financial assistance, handicapped parking tags, durable medical equipment and emergency responder.  Patient Goals/Self Care Activities:  - keep all MD appointments when scheduled - take all medications exactly as prescribed - call MD office to report concerns or change in condition  - discuss my treatment options with the doctor or nurse - make shared treatment decisions with doctor - stay active and get plenty of sleep  - arrange a ride through an agency 1 week before appointment - keep a calendar with appointment dates  Follow Up Plan: Telephone follow up appointment with care management team member scheduled for: 01/21/21   Patient Care Plan: Diabetes Type 2 (Adult)    Problem Identified: Disease Progression (Diabetes, Type 2)   Priority: High    Long-Range Goal: Disease Progression Prevented or Minimized   Start Date: 11/10/2020  Expected End Date: 02/08/2021  This Visit's Progress: On track  Priority: High  Note:   Objective:  Lab Results  Component Value Date   HGBA1C 5.9 (H) 11/11/2020 .   Lab Results  Component Value Date   CREATININE 0.83 11/11/2020   CREATININE 0.86 05/18/2020   CREATININE 0.76 04/05/2020 .   [name] No results found for: EGFR Current Barriers:  [name] Knowledge Deficits related to basic Diabetes pathophysiology and self care/management . Knowledge Deficits related to medications used for management of diabetes . Limited Social Support . Lacks social connections Case Manager Clinical Goal(s):  [name] Over the next 90 days, patient will demonstrate improved adherence to prescribed treatment plan for diabetes self care/management as evidenced by:  . adherence to ADA/ carb modified diet . adherence to  prescribed medication regimen Interventions:  . Provided education to patient about basic DM disease process . Reviewed medications with patient and discussed importance of medication adherence . Discussed plans with patient for ongoing care management follow up and provided patient with direct contact information for care management team . Provided patient with written educational materials related to hypo and hyperglycemia and importance of correct treatment . Advised patient, providing education and rationale, to check cbg before meals and at bedtime, before and after exercise and record, calling the CCM team and or PCP for findings outside established parameters.   . Review of patient status, including review of consultants reports, relevant laboratory and other test results, and medications completed.  Ensure completion of annual comprehensive foot exam and dilated eye exam.    Implement additional individualized goals and interventions based on identified risk factors.   Encourage lifestyle changes, such as increased intake of plant-based foods, stress reduction, consistent physical activity and smoking cessation to prevent long-term complications and chronic disease.    Individualize activity and exercise recommendations while considering potential limitations, such as neuropathy, retinopathy or the ability to prevent hyperglycemia or hypoglycemia.   Patient Goals/Self-Care Activities . Over the next 90 days,  patient will:  - Self administers oral medications as prescribed Attends all scheduled provider appointments Checks blood sugars as prescribed and utilize hyper and hypoglycemia protocol as needed Adheres to prescribed ADA/carb modified - check blood sugar before and after exercise - check blood sugar if I feel it is too high or too low - take the blood sugar log to all doctor visits - take the blood sugar meter to all doctor visits  Follow Up Plan: Telephone follow up  appointment with care management team member scheduled for: 01/21/21   Patient Care Plan: Hypertension (Adult)    Problem Identified: Disease Progression (Hypertension)   Priority: High    Long-Range Goal: Disease Progression Prevented or Minimized   Start Date: 11/10/2020  Expected End Date: 02/08/2021  This Visit's Progress: On track  Priority: High  Note:   Objective:  . Last practice recorded BP readings:  BP Readings from Last 3 Encounters:  11/11/20 116/80  05/18/20 136/86  04/05/20 131/73 .   [name] Most recent eGFR/CrCl: No results found for: EGFR  No components found for: CRCL Current Barriers:  [name] Knowledge Deficits related to basic understanding of hypertension pathophysiology and self care management . Knowledge Deficits related to understanding of medications prescribed for management of hypertension . Difficulty obtaining medications . Limited Social Support . Lacks social connections Case Manager Clinical Goal(s):  [name] Over the next 90 days, patient will verbalize basic understanding of hypertension disease process and self health management plan as evidenced by patient will feel more comfortable Self Checking her BP at home at least 3 times daily and recording her readings; she will take all prescribed BP medications exactly as prescribed  Interventions:  . Evaluation of current treatment plan related to hypertension self management and patient's adherence to plan as established by provider. . Provided education to patient re: stroke prevention, s/s of heart attack and stroke, DASH diet, complications of uncontrolled blood pressure . Reviewed medications with patient and discussed importance of compliance . Discussed plans with patient for ongoing care management follow up and provided patient with direct contact information for care management team  Tailor lifestyle advice to individual; review progress regularly; give frequent encouragement and respond positively to  incremental successes.   Assess for and promote awareness of worsening disease or development of comorbidity.  Patient Goals/Self-Care Activities . Over the next 90 days, patient will:  - Self administers medications as prescribed Attends all scheduled provider appointments Calls provider office for new concerns, questions, or BP outside discussed parameters Follows a low sodium diet/DASH diet - check blood pressure 3 times per week - write blood pressure results in a log or diary  Follow Up Plan: Telephone follow up appointment with care management team member scheduled for: 01/21/21   Patient Care Plan: Osteoarthritis (Adult)    Problem Identified: Pain (Osteoarthritis)   Priority: High    Long-Range Goal: Manage Pain   Start Date: 11/10/2020  Expected End Date: 02/08/2021  This Visit's Progress: On track  Priority: High  Note:   Current Barriers:   Ineffective Self Health Maintenance  Lacks social connections  Currently UNABLE TO independently self manage needs related to chronic health conditions.   Knowledge Deficits related to short term plan for care coordination needs and long term plans for chronic disease management needs Nurse Case Manager Clinical Goal(s):   Over the next 90 days, patient will work with care management team to address care coordination and chronic disease management needs related to Disease Management  Educational  Needs  Care Coordination  Medication Management and Education  Psychosocial Support   Interventions:  . Assess pain level, treatment efficacy and patient response at regular intervals using a consistent pain scale; review the effectiveness and tolerability of all treatments. . Encourage nonpharmacologic measures, such as applied heat or cold, exercise, physical or occupational therapy, orthoses, cognitive behavioral therapy, yoga, tai-chi, acupuncture, adequate sleep and weight loss.  Promote individualized plan to stay active when  unable to participate in physical therapy, such as passive and active range of motion, yoga, walking, water aerobics or swimming.   Support and optimize psychosocial response to pain.    Patient Goals/Self Care Activities:  - use distraction techniques - use relaxation during pain  Follow Up Plan: Telephone follow up appointment with care management team member scheduled for: 01/21/21    Plan:   Telephone follow up appointment with care management team member scheduled for: 01/21/21  Barb Merino, RN, BSN, CCM Care Management Coordinator [facility]  Direct Phone: [phone]

## 2020-11-23 ENCOUNTER — Telehealth: Payer: Self-pay

## 2020-12-07 ENCOUNTER — Telehealth: Payer: Medicare Other

## 2020-12-07 ENCOUNTER — Telehealth: Payer: Self-pay

## 2020-12-07 NOTE — Telephone Encounter (Signed)
  Chronic Care Management   Outreach Note  12/07/2020 Name: Allison Franco MRN: [id number] DOB: December 21, 1940  Referred by: [name], FNP Reason for referral : Care Coordination   A second unsuccessful telephone outreach was attempted today. The patient was referred to the case management team for assistance with care management and care coordination.   Follow Up Plan: A HIPAA compliant phone message was left for the patient providing contact information and requesting a return call.  The care management team will reach out to the patient again over the next 30 days.   Daneen Schick, BSW, CDP Social Worker, Certified Dementia Practitioner [place] [facility] [place] [facility] [phone]

## 2020-12-07 NOTE — Progress Notes (Signed)
12/07/20-The patient confirmed upcoming CCM Follow-up appointment tomorrow on 12/08/20 at 10:00 a.m. with CPP, [name] through Telephone. The patient was advised to have all medications and supplements with her during call for review with the CPP, [name]. The patient verbalized understanding.  Raynelle Highland, [facility] Pharmacist Assistant [phone]

## 2020-12-08 ENCOUNTER — Ambulatory Visit: Payer: Self-pay

## 2020-12-08 DIAGNOSIS — E119 Type 2 diabetes mellitus without complications: Secondary | ICD-10-CM

## 2020-12-08 DIAGNOSIS — I10 Essential (primary) hypertension: Secondary | ICD-10-CM

## 2020-12-08 DIAGNOSIS — I700 Atherosclerosis of aorta: Secondary | ICD-10-CM

## 2020-12-08 NOTE — Chronic Care Management (AMB) (Signed)
Chronic Care Management Pharmacy  Name: Allison Franco  MRN: [id number] DOB: Oct 29, 1941  Chief Complaint/ HPI  Allison Franco,  80 y.o. , female presents for their Follow-Up CCM visit with the clinical pharmacist via telephone due to COVID-19 Pandemic. Patient is originally from [place] and she has been here since 2007. Her brother is here and invited her to [place], [place]. 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 She is no longer going house to house she is video conferencing and phone calls to testify to people.   PCP : [name], FNP  Their chronic conditions include: Hypertension, Type 2 Diabetes mellitus, Insomnia, anxiety/depression.  Office Visits:  11/11/2020 OV: Encounter for immunizations including: Prevnar 40, Tdap and lab visits  05/18/2020 OV: Presented for HTN and DM follow up. Mild bilateral knee pain noted. Pt uses Biofreeze with significant relief. Pt not checking blood sugar at home due to affordability of strips. Referred to ophthalmology due for eye exam. Diabetic foot exam performed, normal findings. Bone density scan ordered. Labs ordered (BMP8+EGFR, HgbA1c, HepC antibody).   01/15/2020 AWV and OV:  Presented for DM follow up visit. Recommended eye exam, bone density ordered in June 2020. Rx for Prevnar13 13 and Tdap sent to pharmacy. Shingles vaccine discussed. Labs ordered (CMP14+EGFR, Lipid panel, HgbA1c). Prophylactic amoxicillin ordered for dental procedure.  Consult Visits:   07/21/2020 Audiology Consult:  07/15/2020 Consult OV: Primary open-angle glaucoma, right eye, mild stage    04/05/2020 ED visit: Presented for tinnitus and SOB related to ears ringing. Referred to ENT (Dr. Benjamine Mola). Patient has been unable to obtain hearing aids.   10/08/2019  ENT OV w/ Dr. Wilburn Cornelia: Presented for follow  up. Pt recommended for hearing aid but unable to afford in 2020. Sought care at [facility] audiology. 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  Medications: Outpatient Encounter Medications as of 12/08/2020  Medication Sig  . acetaminophen (TYLENOL) 500 MG tablet Take 1 tablet (500 mg total) by mouth every 6 (six) hours as needed for mild pain, moderate pain or headache. Reported on 05/10/2016 (Patient not taking: No sig reported)  . acetaminophen (TYLENOL) 650 MG CR tablet Take 1,300 mg by mouth in the morning. More in the evening if needed  . Alcohol Swabs (ALCOHOL WIPES) 70 % PADS by Does not apply route. Use with blood sugar check and injection of insulin (Patient not taking: Reported on 05/27/2020)  . blood glucose meter kit and supplies KIT Dispense based on patient and insurance preference. Use up to four times daily as directed. (FOR ICD-9 250.00, 250.01).  . Cholecalciferol (VITAMIN D3) 25 MCG (1000 UT) CAPS Take 1 capsule by mouth daily.  . clonazePAM (KLONOPIN) 0.5 MG tablet Take 0.5 mg by mouth at bedtime.   . diclofenac Sodium (VOLTAREN) 1 % GEL Apply 2 g topically 4 (four) times daily.  [name] glucose blood test strip Use as instructed to check blood sugars 3 times a day. Dx code: e11.65  . latanoprost (XALATAN) 0.005 % ophthalmic solution Place 1 drop into both eyes at bedtime.  . metFORMIN (GLUCOPHAGE) 500 MG tablet Take 1 tablet (500 mg total) by mouth daily after lunch.  . olmesartan (BENICAR) 20 MG tablet TAKE 1 TABLET (20 MG TOTAL) BY MOUTH DAILY.   No facility-administered encounter medications on file as of 12/08/2020.  Allergies  Allergen Reactions  . Aspirin Other (See Comments)    insomnia  . Hydroxyzine Other (See Comments)    insomnia  . Valium [Diazepam] Other (See Comments)    Insomnia, 05/10/16 pt states she took as young person- made her depressed   Current Diagnosis/Assessment:  Goals Addressed            This Visit's  Progress   . Pharmacy Care Plan       CARE PLAN ENTRY (see longitudinal plan of care for additional care plan information)  Current Barriers:  . Chronic Disease Management support, education, and care coordination needs related to Hypertension, Diabetes, Depression, and Anxiety   Hypertension BP Readings from Last 3 Encounters:  05/18/20 136/86  04/05/20 131/73  01/15/20 126/84   . Pharmacist Clinical Goal(s): o Over the next 90 days, patient will work with PharmD and providers to achieve BP goal <130/80 . Current regimen:  o Olmesartan $RemoveBefo'20mg'jsRYsoTgsBk$  daily . Interventions: o Stressed importance of medication adherence o Provided dietary and exercise recommendations o Patient to have BP checks with CPA to make her more comfortable with checking her BP  . Patient self care activities - Over the next 90 days, patient will: o Check BP several times weekly, document, and provide at future appointments o Ensure daily salt intake < 2300 mg/day o Exercise 30 minutes a day 5 day per week  Diabetes Lab Results  Component Value Date/Time   HGBA1C 5.9 (H) 11/11/2020 12:32 PM   HGBA1C 6.3 (H) 05/18/2020 11:31 AM   . Pharmacist Clinical Goal(s): o Over the next 90 days, patient will work with PharmD and providers to maintain A1c goal <7% . Current regimen:  o Metformin $RemoveBef'500mg'bLLfClDddA$  once daily after lunch . Interventions: o Provided dietary and exercise recommendations o Provided patient with Daily Diabetes Meal Planning Guide and blood glucose log . Patient self care activities - Over the next 90 days, patient will: o Check blood sugar every 3-4 days and if symptomatic, document, and provide at future appointments if able to obtain strips from pharmacy o Contact provider with any episodes of hypoglycemia  Knee Pain . Pharmacist Clinical Goal(s) o Over the next 30 days, patient will work with PharmD and providers to relieve knee pain . Current regimen:  o Diclofenac 2g 4 times  daily . Interventions: o Determined diclofenac has been helping with patient's knee pain . Patient self care activities - Over the next 30 days, patient will: o Continue using diclofenac for knee pain  Anxiety . Pharmacist Clinical Goal(s) o Over the next 30 days, patient will work with PharmD and providers to keep her anxiety down  . Current regimen:  o Clonazepam 0.5 mg- take twice daily  . Interventions: o Patient takes Clonazepam 0.5 mg as needed for anxiety  . Patient self care activities - Over the next 30 days, patient will: o Continue taking anxiety medication as needed   Medication management . Pharmacist Clinical Goal(s): o Over the next 90 days, patient will work with PharmD and providers to achieve optimal medication adherence . Current pharmacy: [facility] and [facility] . Interventions o Comprehensive medication review performed. o Continue current medication management strategy . Patient self care activities - Over the next 90 days, patient will: o Focus on medication adherence by considering use of pill box o Consider adherence packaging and delivery available at [facility] o Take medications as prescribed o Report any questions or concerns to PharmD and/or provider(s)  Please see past updates  related to this goal by clicking on the "Past Updates" button in the selected goal          Diabetes   <8  Recent Relevant Labs: Lab Results  Component Value Date/Time   HGBA1C 5.9 (H) 11/11/2020 12:32 PM   HGBA1C 6.3 (H) 05/18/2020 11:31 AM    Checking BG: Daily    Recent FBG Readings: 111-120 Recent pre-meal BG readings:  Recent 2hr PP BG readings:   Recent HS BG readings:  Patient has failed these meds in past: Levemir, Lantus, Tradjenta,  Patient is currently controlled on the following medications:  Metformin 564m daily after lunch   Last diabetic Foot exam: 05/18/20  Last diabetic Eye exam:  Lab Results  Component Value Date/Time    HMDIABEYEEXA No Retinopathy 07/15/2020 12:00 AM    We discussed:  . Diet extensively o Pt states that she only drinks water and no longer drinks anything with sugar  o She prepares food at home - Morning : Oatmeal with raisins, tangerine, one egg white . Sometimes she has green grapes   - Lunch : Salad, with two slices of bread.  . Steams chicken with onion, pepper, pepper and very little salt - Dinner: African Soup with salmon or chicken. She sometimes has green plantain  . Uses the plantain to make Fufu . She eats a lot of green beans   Exercise extensively Walks a lot  Plan  Continue current medications   Hypertension   Goal < 130/80   Office blood pressures are  BP Readings from Last 3 Encounters:  11/11/20 116/80  05/18/20 136/86  04/05/20 131/73    Patient has failed these meds in the past: Lisinopril Patient is currently controlled on the following medications:    Olmesartan 20 mg daily  Patient checks BP at home infrequently  Patient home BP readings are ranging: 140/82 when checked in office today  We discussed: Diet and exercise extensively  Pt states she does not get a lot of headaches  We discussed how to check her BP at home   Pt scare of checking her BP at home because of how it lights up   Visiting nurse visit her at home and they will check her BP.   The only come every now and again.    Encouraged patient that she does not need to check her BP everyday if she is not comfortable  She would like to check her BP at home   Plan Continue current medications  BP assessment calls once a month with CPA to help her relax through the process   Aortic Atherosclerosis    LDL goal < 70  Last lipids Lab Results  Component Value Date   CHOL 168 11/11/2020   HDL 40 11/11/2020   LDLCALC 100 (H) 11/11/2020   TRIG 159 (H) 11/11/2020   CHOLHDL 4.2 11/11/2020   Hepatic Function Latest Ref Rng & Units 04/05/2020 01/15/2020 08/06/2019  Total Protein 6.5  - 8.1 g/dL 6.5 7.0 7.3  Albumin 3.5 - 5.0 g/dL 3.4(L) 4.0 3.8  AST 15 - 41 U/L _0 ALT 0 - 44 U/L _1 Alk Phosphatase 38 - 126 U/L 81 111 87  Total Bilirubin 0.3 - 1.2 mg/dL 0.7 0.2 0.7  Bilirubin, Direct 0.0 - 0.2 mg/dL - - -     The 10-year ASCVD risk score (Mikey BussingDC Jr., et al., 2013) is: 25.9%   Values used to calculate the score:  Age: 80 years     Sex: Female     Is Non-Hispanic African American: Yes     Diabetic: Yes     Tobacco smoker: No     Systolic Blood Pressure: 115 mmHg     Is BP treated: Yes     HDL Cholesterol: 40 mg/dL     Total Cholesterol: 168 mg/dL   Patient has failed these meds in past: simvastatin 10 mg daily  Patient is currently uncontrolled: not currently taking any medications   We will discuss during next office visit   Plan  Continue control with diet and exercise  Will collaborate with PCP team to add atorvastatin 10 mg tablet every other day if patient is able to tolerate and then have labs done in a month to determine how well they are doing with the medication.   Anxiety/Depression/Insomnia   Patient has failed these meds in past: Hydroxyzine, zolpidem Patient is currently controlled on the following medications:  Clonazepam 0.5 mg twice daily (pt only takes 1 tablet at night)  Plan Continue current medications  Osteopenia / Osteoporosis Screening   Last DEXA Scan: Recommended at [facility] on 06/21   Last vitamin D/Calcium Lab Results  Component Value Date   CALCIUM 8.9 11/11/2020    We discussed:  Recommend [phone] units of vitamin D daily.  Plan Patient has failed these meds in past: Calcium carbonate, calcium with d Patient is currently controlled on the following medications:   Cholecalciferol 1000 units daily  Plan Continue current medications  Recommend Vitamin D level at next appointment Recommend scheduling DEXA scan   Knee Pain   Patient is currently controlled on the following medications:    Diclofenac gel 1% 2 g 4 times daily   Tylenol 637m 2 tablets in the morning  We discussed:  Pt states her knees are feeling better  Diclofenac has helped her knee pain  Pt states she normally take 2 Tylenol tablets in the morning but may take more in the evening if needed  Plan Continue current medication regimen   Vaccines   Reviewed and discussed patient's vaccination history.  Immunization History  Administered Date(s) Administered  . Fluad Quad(high Dose 65+) 11/11/2020  . Influenza Whole 11/18/2009  . Influenza-Unspecified 09/24/2018  . PFIZER SARS-COV-2 Vaccination 02/14/2020, 03/10/2020, 10/28/2020  . Pneumococcal Polysaccharide-23 02/01/2007  . Td 02/01/2007   Will discuss at next visit.   Plan Recommended patient receive Shingrix vaccine at pharmacy.   Hearing loss and tinnitus   We discussed:   Pt has obtained hearing aides and states they have helped her ears  Medication Management   Pt uses [facility] for all medications Uses pill box? No - because she does not take many medications Pt endorses 100% compliance, but is not taking all medications as prescribed  We discussed:   Importance of taking medications every day as directed  Delivery option available at [facility] would be beneficial since patient does not have a car  Pt states she loves [facility] and they are like family to her  She has no problem walking to pharmacy if the weather is nice, if the weather is bad other people can pick up her medications for her  Medications free with new insurance plan  Plan Continue current medication management strategy  Follow up: 4 month phone visit  VOrlando Penner PharmD Clinical Pharmacist [facility] [phone]

## 2020-12-08 NOTE — Patient Instructions (Addendum)
Visit Information  Goals Addressed            This Visit's Progress   . Pharmacy Care Plan       CARE PLAN ENTRY (see longitudinal plan of care for additional care plan information)  Current Barriers:  . Chronic Disease Management support, education, and care coordination needs related to Hypertension, Diabetes, Depression, and Anxiety   Hypertension BP Readings from Last 3 Encounters:  05/18/20 136/86  04/05/20 131/73  01/15/20 126/84   . Pharmacist Clinical Goal(s): o Over the next 90 days, patient will work with PharmD and providers to achieve BP goal <130/80 . Current regimen:  o Olmesartan 20mg  daily . Interventions: o Stressed importance of medication adherence o Provided dietary and exercise recommendations o Patient to have BP checks with CPA to make her more comfortable with checking her BP  . Patient self care activities - Over the next 90 days, patient will: o Check BP several times weekly, document, and provide at future appointments o Ensure daily salt intake < 2300 mg/day o Exercise 30 minutes a day 5 day per week  Diabetes Lab Results  Component Value Date/Time   HGBA1C 5.9 (H) 11/11/2020 12:32 PM   HGBA1C 6.3 (H) 05/18/2020 11:31 AM   . Pharmacist Clinical Goal(s): o Over the next 90 days, patient will work with PharmD and providers to maintain A1c goal <7% . Current regimen:  o Metformin 500mg  once daily after lunch . Interventions: o Provided dietary and exercise recommendations o Provided patient with Daily Diabetes Meal Planning Guide and blood glucose log . Patient self care activities - Over the next 90 days, patient will: o Check blood sugar every 3-4 days and if symptomatic, document, and provide at future appointments if able to obtain strips from pharmacy o Contact provider with any episodes of hypoglycemia  Knee Pain . Pharmacist Clinical Goal(s) o Over the next 30 days, patient will work with PharmD and providers to relieve knee  pain . Current regimen:  o Diclofenac 2g 4 times daily . Interventions: o Determined diclofenac has been helping with patient's knee pain . Patient self care activities - Over the next 30 days, patient will: o Continue using diclofenac for knee pain  Anxiety . Pharmacist Clinical Goal(s) o Over the next 30 days, patient will work with PharmD and providers to keep her anxiety down  . Current regimen:  o Clonazepam 0.5 mg- take twice daily  . Interventions: o Patient takes Clonazepam 0.5 mg as needed for anxiety  . Patient self care activities - Over the next 30 days, patient will: o Continue taking anxiety medication as needed   Medication management . Pharmacist Clinical Goal(s): o Over the next 90 days, patient will work with PharmD and providers to achieve optimal medication adherence . Current pharmacy: [facility] and [facility] . Interventions o Comprehensive medication review performed. o Continue current medication management strategy . Patient self care activities - Over the next 90 days, patient will: o Focus on medication adherence by considering use of pill box o Consider adherence packaging and delivery available at [facility] o Take medications as prescribed o Report any questions or concerns to PharmD and/or provider(s)  Please see past updates related to this goal by clicking on the "Past Updates" button in the selected goal          The patient verbalized understanding of instructions, educational materials, and care plan provided today and declined offer to receive copy of patient instructions, educational materials, and  care plan.   The pharmacy team will reach out to the patient again over the next 60 days.   Mayford Knife, [facility]

## 2020-12-15 ENCOUNTER — Other Ambulatory Visit (HOSPITAL_COMMUNITY): Payer: Self-pay

## 2021-01-04 ENCOUNTER — Telehealth: Payer: Medicare Other

## 2021-01-04 ENCOUNTER — Ambulatory Visit: Payer: Self-pay

## 2021-01-04 DIAGNOSIS — I10 Essential (primary) hypertension: Secondary | ICD-10-CM

## 2021-01-04 DIAGNOSIS — E119 Type 2 diabetes mellitus without complications: Secondary | ICD-10-CM

## 2021-01-04 NOTE — Chronic Care Management (AMB) (Signed)
  Chronic Care Management   Outreach Note  01/04/2021 Name: Allison Franco MRN: [id number] DOB: 1941/04/07  Referred by: [name], FNP Reason for referral : [facility] unsuccessful telephone outreach was attempted today. The patient was referred to the case management team for assistance with care management and care coordination. The patient's primary care provider has been notified of our unsuccessful attempts to make or maintain contact with the patient. The care management team is pleased to engage with this patient at any time in the future should he/she be interested in assistance from the care management team.     Follow Up Plan: No SW follow up planned at this time due to inability to maintain patient contact. The patient will remain engaged with RN Care Manager and embedded PharmD.  Daneen Schick, BSW, CDP Social Worker, Certified Dementia Practitioner [facility] [phone]

## 2021-01-20 ENCOUNTER — Other Ambulatory Visit: Payer: Self-pay | Admitting: Nurse Practitioner

## 2021-01-20 ENCOUNTER — Other Ambulatory Visit: Payer: Self-pay

## 2021-01-20 ENCOUNTER — Encounter: Payer: Self-pay | Admitting: Nurse Practitioner

## 2021-01-20 ENCOUNTER — Ambulatory Visit (INDEPENDENT_AMBULATORY_CARE_PROVIDER_SITE_OTHER): Payer: Medicare Other | Admitting: Nurse Practitioner

## 2021-01-20 VITALS — BP 132/80 | HR 74 | Temp 98.0°F | Ht 61.4 in | Wt 183.2 lb

## 2021-01-20 DIAGNOSIS — M25511 Pain in right shoulder: Secondary | ICD-10-CM

## 2021-01-20 DIAGNOSIS — I10 Essential (primary) hypertension: Secondary | ICD-10-CM

## 2021-01-20 DIAGNOSIS — I700 Atherosclerosis of aorta: Secondary | ICD-10-CM

## 2021-01-20 DIAGNOSIS — Z23 Encounter for immunization: Secondary | ICD-10-CM | POA: Diagnosis not present

## 2021-01-20 DIAGNOSIS — E119 Type 2 diabetes mellitus without complications: Secondary | ICD-10-CM

## 2021-01-20 DIAGNOSIS — Z0000 Encounter for general adult medical examination without abnormal findings: Secondary | ICD-10-CM | POA: Diagnosis not present

## 2021-01-20 LAB — POCT URINALYSIS DIPSTICK
Bilirubin, UA: NEGATIVE
Blood, UA: NEGATIVE
Glucose, UA: NEGATIVE
Ketones, UA: NEGATIVE
Nitrite, UA: NEGATIVE
Protein, UA: NEGATIVE
Spec Grav, UA: 1.015 (ref 1.010–1.025)
Urobilinogen, UA: 0.2 E.U./dL
pH, UA: 6 (ref 5.0–8.0)

## 2021-01-20 LAB — POCT UA - MICROALBUMIN
Albumin/Creatinine Ratio, Urine, POC: 30
Creatinine, POC: 100 mg/dL
Microalbumin Ur, POC: 10 mg/L

## 2021-01-20 MED ORDER — PNEUMOCOCCAL 13-VAL CONJ VACC IM SUSP: 0.5000 mL | INTRAMUSCULAR | 0 refills | Status: DC

## 2021-01-20 MED ORDER — TETANUS-DIPHTH-ACELL PERTUSSIS 5-2.5-18.5 LF-MCG/0.5 IM SUSP: 0.5000 mL | Freq: Once | INTRAMUSCULAR | 0 refills | Status: DC

## 2021-01-20 MED ORDER — DICLOFENAC SODIUM 1 % EX GEL: 2.0000 g | Freq: Four times a day (QID) | CUTANEOUS | 2 refills | Status: DC

## 2021-01-20 NOTE — Patient Instructions (Signed)
Health Maintenance After Age 80 After age 64, you are at a higher risk for certain long-term diseases and infections as well as injuries from falls. Falls are a major cause of broken bones and head injuries in people who are older than age 41. Getting regular preventive care can help to keep you healthy and well. Preventive care includes getting regular testing and making lifestyle changes as recommended by your health care provider. Talk with your health care provider about:  Which screenings and tests you should have. A screening is a test that checks for a disease when you have no symptoms.  A diet and exercise plan that is right for you. What should I know about screenings and tests to prevent falls? Screening and testing are the best ways to find a health problem early. Early diagnosis and treatment give you the best chance of managing medical conditions that are common after age 19. Certain conditions and lifestyle choices may make you more likely to have a fall. Your health care provider may recommend:  Regular vision checks. Poor vision and conditions such as cataracts can make you more likely to have a fall. If you wear glasses, make sure to get your prescription updated if your vision changes.  Medicine review. Work with your health care provider to regularly review all of the medicines you are taking, including over-the-counter medicines. Ask your health care provider about any side effects that may make you more likely to have a fall. Tell your health care provider if any medicines that you take make you feel dizzy or sleepy.  Osteoporosis screening. Osteoporosis is a condition that causes the bones to get weaker. This can make the bones weak and cause them to break more easily.  Blood pressure screening. Blood pressure changes and medicines to control blood pressure can make you feel dizzy.  Strength and balance checks. Your health care provider may recommend certain tests to check your  strength and balance while standing, walking, or changing positions.  Foot health exam. Foot pain and numbness, as well as not wearing proper footwear, can make you more likely to have a fall.  Depression screening. You may be more likely to have a fall if you have a fear of falling, feel emotionally low, or feel unable to do activities that you used to do.  Alcohol use screening. Using too much alcohol can affect your balance and may make you more likely to have a fall. What actions can I take to lower my risk of falls? General instructions  Talk with your health care provider about your risks for falling. Tell your health care provider if: ? You fall. Be sure to tell your health care provider about all falls, even ones that seem minor. ? You feel dizzy, sleepy, or off-balance.  Take over-the-counter and prescription medicines only as told by your health care provider. These include any supplements.  Eat a healthy diet and maintain a healthy weight. A healthy diet includes low-fat dairy products, low-fat (lean) meats, and fiber from whole grains, beans, and lots of fruits and vegetables. Home safety  Remove any tripping hazards, such as rugs, cords, and clutter.  Install safety equipment such as grab bars in bathrooms and safety rails on stairs.  Keep rooms and walkways well-lit. Activity  Follow a regular exercise program to stay fit. This will help you maintain your balance. Ask your health care provider what types of exercise are appropriate for you.  If you need a cane or walker,  use it as recommended by your health care provider.  Wear supportive shoes that have nonskid soles.   Lifestyle  Do not drink alcohol if your health care provider tells you not to drink.  If you drink alcohol, limit how much you have: ? 0-1 drink a day for women. ? 0-2 drinks a day for men.  Be aware of how much alcohol is in your drink. In the [place] one drink equals one typical bottle of beer (12  oz), one-half glass of wine (5 oz), or one shot of hard liquor (1 oz).  Do not use any products that contain nicotine or tobacco, such as cigarettes and e-cigarettes. If you need help quitting, ask your health care provider. Summary  Having a healthy lifestyle and getting preventive care can help to protect your health and wellness after age 8.  Screening and testing are the best way to find a health problem early and help you avoid having a fall. Early diagnosis and treatment give you the best chance for managing medical conditions that are more common for people who are older than age 70.  Falls are a major cause of broken bones and head injuries in people who are older than age 10. Take precautions to prevent a fall at home.  Work with your health care provider to learn what changes you can make to improve your health and wellness and to prevent falls. This information is not intended to replace advice given to you by your health care provider. Make sure you discuss any questions you have with your health care provider. Document Revised: 03/07/2019 Document Reviewed: 09/27/2017 Elsevier Patient Education  2021 [place] can take tumeric supplement daily for your arm pain.

## 2021-01-20 NOTE — Progress Notes (Signed)
I,[name] as a Education administrator for [employer], FNP.,have documented all relevant documentation on the behalf of [name], FNP,as directed by  [name], FNP while in the presence of [name], [place]. This visit occurred during the SARS-CoV-2 public health emergency.  Safety protocols were in place, including screening questions prior to the visit, additional usage of staff PPE, and extensive cleaning of exam room while observing appropriate contact time as indicated for disinfecting solutions.  Subjective:     Patient ID: Allison Franco , female    DOB: June 30, 1941 , 80 y.o.   MRN: [id number]   Chief Complaint  Patient presents with  . Annual Exam    HPI  Here for HM.    She is reporting she has been taking clonazepam but does not want to take anymore.   Diabetes She presents for her follow-up diabetic visit. She has type 2 diabetes mellitus. There are no hypoglycemic associated symptoms. Pertinent negatives for hypoglycemia include no confusion, dizziness, headaches or nervousness/anxiousness. There are no diabetic associated symptoms. Pertinent negatives for diabetes include no chest pain, no fatigue, no polydipsia, no polyphagia and no polyuria. There are no hypoglycemic complications. Symptoms are stable. There are no diabetic complications. Risk factors for coronary artery disease include sedentary lifestyle and obesity. When asked about current treatments, none were reported. She is following a generally healthy diet. When asked about meal planning, she reported none. She has not had a previous visit with a dietitian. She rarely participates in exercise. (She has not been checking her blood sugar due to the cost of the strips and not working.) An ACE inhibitor/angiotensin II receptor blocker is being taken. She does not see a podiatrist.Eye exam is not current.  Hypertension This is a chronic problem. The current episode started more than 1 year ago. The problem is  controlled. Pertinent negatives include no chest pain, headaches or palpitations. There are no associated agents to hypertension. Past treatments include angiotensin blockers. There are no compliance problems.  There is no history of angina. There is no history of chronic renal disease.     Past Medical History:  Diagnosis Date  . Anxiety   . Constipation   . Depression    "following husband's death"  . Diabetes mellitus without complication ([place])   . Hypertension   . KNEE PAIN, BILATERAL 02/13/2009   Qualifier: Diagnosis of  By: Hassell Done FNP, Tori Milks    . Tinnitus      Family History  Problem Relation Age of Onset  . Diabetes Mother   . Hypertension Father      Current Outpatient Medications:  .  acetaminophen (TYLENOL) 500 MG tablet, Take 1 tablet (500 mg total) by mouth every 6 (six) hours as needed for mild pain, moderate pain or headache. Reported on 05/10/2016, Disp: 30 tablet, Rfl: 0 .  Alcohol Swabs (ALCOHOL WIPES) 70 % PADS, by Does not apply route. Use with blood sugar check and injection of insulin, Disp: , Rfl:  .  blood glucose meter kit and supplies KIT, Dispense based on patient and insurance preference. Use up to four times daily as directed. (FOR ICD-9 250.00, 250.01)., Disp: 1 each, Rfl: 0 .  Cholecalciferol (VITAMIN D3) 25 MCG (1000 UT) CAPS, Take 1 capsule by mouth daily., Disp: , Rfl:  .  clonazePAM (KLONOPIN) 0.5 MG tablet, Take 0.5 mg by mouth at bedtime. , Disp: , Rfl:  .  cloNIDine (CATAPRES) 0.1 MG tablet, Take 0.1 mg by mouth. Take one tablet by mouth  when provider instructs you to take, Disp: , Rfl:  .  diclofenac Sodium (VOLTAREN) 1 % GEL, Apply 2 g topically 4 (four) times daily., Disp: 100 g, Rfl: 2 .  diclofenac Sodium (VOLTAREN) 1 % GEL, Apply 2 g topically 4 (four) times daily., Disp: 100 g, Rfl: 2 .  glucose blood test strip, Use as instructed to check blood sugars 3 times a day. Dx code: e11.65, Disp: 100 each, Rfl: 12 .  latanoprost (XALATAN) 0.005 %  ophthalmic solution, Place 1 drop into both eyes at bedtime., Disp: , Rfl:  .  metFORMIN (GLUCOPHAGE) 500 MG tablet, Take 1 tablet (500 mg total) by mouth daily after lunch., Disp: 90 tablet, Rfl: 1 .  Multiple Vitamins-Minerals (CENTRUM SILVER 50+WOMEN) TABS, Take 1 tablet by mouth daily., Disp: , Rfl:  .  olmesartan (BENICAR) 20 MG tablet, TAKE 1 TABLET (20 MG TOTAL) BY MOUTH DAILY., Disp: 90 tablet, Rfl: 1   Allergies  Allergen Reactions  . Aspirin Other (See Comments)    insomnia  . Hydroxyzine Other (See Comments)    insomnia  . Valium [Diazepam] Other (See Comments)    Insomnia, 05/10/16 pt states she took as young person- made her depressed      The patient states she uses status post hysterectomy for birth control.  No LMP recorded. Patient has had a hysterectomy.. Negative for Dysmenorrhea and Negative for Menorrhagia. Negative for: breast discharge, breast lump(s), breast pain and breast self exam. Associated symptoms include abnormal vaginal bleeding. Pertinent negatives include abnormal bleeding (hematology), anxiety, decreased libido, depression, difficulty falling sleep, dyspareunia, history of infertility, nocturia, sexual dysfunction, sleep disturbances, urinary incontinence, urinary urgency, vaginal discharge and vaginal itching. Diet regular. The patient states her exercise level is minimal.  She exercises with her activity at home.    The patient's tobacco use is:  Social History   Tobacco Use  Smoking Status Never Smoker  Smokeless Tobacco Never Used   She has been exposed to passive smoke. The patient's alcohol use is:  Social History   Substance and Sexual Activity  Alcohol Use No    Review of Systems  Constitutional: Negative for fatigue.  HENT: Negative.   Respiratory: Negative.   Cardiovascular: Negative for chest pain, palpitations and leg swelling.  Endocrine: Negative for polydipsia, polyphagia and polyuria.  Neurological: Negative for dizziness and  headaches.  Psychiatric/Behavioral: Negative for confusion. The patient is not nervous/anxious.      Today's Vitals   01/20/21 1145  BP: 132/80  Pulse: 74  Temp: 98 F (36.7 C)  TempSrc: Oral  Weight: 183 lb 3.2 oz (83.1 kg)  Height: 5' 1.4" (1.56 m)  PainSc: 3    Body mass index is 34.17 kg/m.   Objective:  Physical Exam Constitutional:      General: She is not in acute distress.    Appearance: Normal appearance. She is well-developed. She is obese.  HENT:     Head: Normocephalic and atraumatic.     Right Ear: Hearing, tympanic membrane, ear canal and external ear normal. There is no impacted cerumen.     Left Ear: Hearing, tympanic membrane, ear canal and external ear normal. There is no impacted cerumen.     Nose:     Comments: Deferred - masked    Mouth/Throat:     Comments: Deferred - masked Eyes:     General: Lids are normal.     Extraocular Movements: Extraocular movements intact.     Conjunctiva/sclera: Conjunctivae normal.  Pupils: Pupils are equal, round, and reactive to light.     Funduscopic exam:    Right eye: No papilledema.        Left eye: No papilledema.  Neck:     Thyroid: No thyroid mass.     Vascular: No carotid bruit.  Cardiovascular:     Rate and Rhythm: Normal rate and regular rhythm.     Pulses: Normal pulses.     Heart sounds: Normal heart sounds. No murmur heard.   Pulmonary:     Effort: Pulmonary effort is normal. No respiratory distress.     Breath sounds: Normal breath sounds. No wheezing.  Chest:     Chest wall: No mass.  Breasts:     Tanner Score is 5.     Right: Normal. No mass, tenderness, axillary adenopathy or supraclavicular adenopathy.     Left: Normal. No mass, tenderness, axillary adenopathy or supraclavicular adenopathy.    Abdominal:     General: Abdomen is flat. Bowel sounds are normal. There is no distension.     Palpations: Abdomen is soft.     Tenderness: There is no abdominal tenderness.  Genitourinary:     Rectum: Guaiac result negative.  Musculoskeletal:        General: Tenderness (right shoulder ) present. No swelling. Normal range of motion.     Cervical back: Full passive range of motion without pain, normal range of motion and neck supple.     Right lower leg: No edema.     Left lower leg: No edema.  Lymphadenopathy:     Upper Body:     Right upper body: No supraclavicular, axillary or pectoral adenopathy.     Left upper body: No supraclavicular, axillary or pectoral adenopathy.  Skin:    General: Skin is warm and dry.     Capillary Refill: Capillary refill takes less than 2 seconds.  Neurological:     General: No focal deficit present.     Mental Status: She is alert and oriented to person, place, and time.     Cranial Nerves: No cranial nerve deficit.     Sensory: No sensory deficit.  Psychiatric:        Mood and Affect: Mood normal.        Behavior: Behavior normal.        Thought Content: Thought content normal.        Judgment: Judgment normal.         Assessment And Plan:     1. Encounter for general adult medical examination w/o abnormal findings . Behavior modifications discussed and diet history reviewed.   . Pt will continue to exercise regularly and modify diet with low GI, plant based foods and decrease intake of processed foods.  . Recommend intake of daily multivitamin, Vitamin D, and calcium.  . Recommend mammogram and colonoscopy (up to date)  for preventive screenings, as well as recommend immunizations that include influenza, TDAP, and Shingles  2. Essential hypertension . B/P is controlled.  . CMP ordered to check renal function.  . The importance of regular exercise and dietary modification was stressed to the patient.  . Stressed importance of losing ten percent of her body weight to help with B/P control.  . The weight loss would help with decreasing cardiac and cancer risk as well.  . EKG done with NSR - POCT Urinalysis Dipstick (81002) - EKG  12-Lead  3. Type 2 diabetes mellitus without complication, without long-term current use of insulin (HCC) Chronic,  stable Continue with current medications - POCT UA - Microalbumin  4. Aortic atherosclerosis (HCC)  Chronic, will discuss taking a statin to help with heart disease  5. Encounter for immunization Rx sent for pneumonia and tdap to pharmacy  6. Acute pain of right shoulder  Has tenderness to palpation will treat with diclofenac gel this is likely arthritis vs bursitis - diclofenac Sodium (VOLTAREN) 1 % GEL; Apply 2 g topically 4 (four) times daily.  Dispense: 100 g; Refill: 2     Patient was given opportunity to ask questions. Patient verbalized understanding of the plan and was able to repeat key elements of the plan. All questions were answered to their satisfaction.   [name], FNP    I, [name], FNP, have reviewed all documentation for this visit. The documentation on 01/19/21 for the exam, diagnosis, procedures, and orders are all accurate and complete.  THE PATIENT IS ENCOURAGED TO PRACTICE SOCIAL DISTANCING DUE TO THE COVID-19 PANDEMIC.

## 2021-01-21 ENCOUNTER — Telehealth: Payer: Medicare Other

## 2021-01-21 ENCOUNTER — Telehealth: Payer: Self-pay

## 2021-01-21 NOTE — Telephone Encounter (Signed)
  Chronic Care Management   Outreach Note  01/21/2021 Name: Allison Franco MRN: [id number] DOB: 1941-07-14  Referred by: [name], FNP Reason for referral : Chronic Care Management (RN CM FU Call )   An unsuccessful telephone outreach was attempted today. The patient was referred to the case management team for assistance with care management and care coordination.   Follow Up Plan: A HIPAA compliant phone message was left for the patient providing contact information and requesting a return call. Telephone follow up appointment with care management team member scheduled for: 02/23/21  Barb Merino, RN, BSN, CCM Care Management Coordinator [place] [facility]  Direct Phone: [phone]

## 2021-02-12 ENCOUNTER — Other Ambulatory Visit (HOSPITAL_COMMUNITY): Payer: Self-pay

## 2021-02-15 ENCOUNTER — Other Ambulatory Visit: Payer: Self-pay | Admitting: Nurse Practitioner

## 2021-02-18 ENCOUNTER — Ambulatory Visit
Admission: RE | Admit: 2021-02-18 | Discharge: 2021-02-18 | Disposition: A | Discharge status: Home / Self Care | Payer: Medicare Other | Source: Ambulatory Visit | Attending: Nurse Practitioner | Admitting: Nurse Practitioner

## 2021-02-18 ENCOUNTER — Other Ambulatory Visit: Payer: Self-pay

## 2021-02-18 DIAGNOSIS — E2839 Other primary ovarian failure: Secondary | ICD-10-CM

## 2021-02-19 ENCOUNTER — Other Ambulatory Visit: Payer: Medicare Other

## 2021-02-23 ENCOUNTER — Ambulatory Visit (INDEPENDENT_AMBULATORY_CARE_PROVIDER_SITE_OTHER): Payer: Medicare Other

## 2021-02-23 ENCOUNTER — Telehealth: Payer: Medicare Other

## 2021-02-23 DIAGNOSIS — I10 Essential (primary) hypertension: Secondary | ICD-10-CM | POA: Diagnosis not present

## 2021-02-23 DIAGNOSIS — E119 Type 2 diabetes mellitus without complications: Secondary | ICD-10-CM | POA: Diagnosis not present

## 2021-02-23 DIAGNOSIS — M25561 Pain in right knee: Secondary | ICD-10-CM

## 2021-02-23 DIAGNOSIS — G8929 Other chronic pain: Secondary | ICD-10-CM

## 2021-02-24 ENCOUNTER — Ambulatory Visit (INDEPENDENT_AMBULATORY_CARE_PROVIDER_SITE_OTHER): Payer: Medicare Other

## 2021-02-24 VITALS — Ht 63.0 in | Wt 180.0 lb

## 2021-02-24 DIAGNOSIS — Z0000 Encounter for general adult medical examination without abnormal findings: Secondary | ICD-10-CM

## 2021-02-24 NOTE — Addendum Note (Signed)
Addended by: Kellie Simmering on: 02/24/2021 11:49 AM   Modules accepted: Orders

## 2021-02-24 NOTE — Progress Notes (Signed)
I connected with Allison Franco today by telephone and verified that I am speaking with the correct person using two identifiers. Location patient: home Location provider: work Persons participating in the virtual visit: Perel, Hauschild LPN.   I discussed the limitations, risks, security and privacy concerns of performing an evaluation and management service by telephone and the availability of in person appointments. I also discussed with the patient that there may be a patient responsible charge related to this service. The patient expressed understanding and verbally consented to this telephonic visit.    Interactive audio and video telecommunications were attempted between this provider and patient, however failed, due to patient having technical difficulties OR patient did not have access to video capability.  We continued and completed visit with audio only.     Vital signs may be patient reported or missing.  Subjective:   Allison Franco is a 80 y.o. female who presents for Medicare Annual (Subsequent) preventive examination.  Review of Systems     Cardiac Risk Factors include: diabetes mellitus;hypertension;sedentary lifestyle;obesity (BMI >30kg/m2)     Objective:    Today's Vitals   02/24/21 1011 02/24/21 1012  Weight: 180 lb (81.6 kg)   Height: '5\' 3"'  (1.6 m)   PainSc:  6    Body mass index is 31.89 kg/m.  Advanced Directives 02/24/2021 04/05/2020 01/15/2020 10/13/2019 08/07/2019 08/07/2019 08/06/2019  Does Patient Have a Medical Advance Directive? Yes Yes Yes Yes No No No  Type of Advance Directive Out of facility DNR (pink MOST or yellow form) - Press photographer;Living will [place] - - -  Does patient want to make changes to medical advance directive? - No - Patient declined - - - - -  Copy of [place] in Chart? - - Yes - validated most recent copy scanned in chart (See row information) - - - -  Would patient like  information on creating a medical advance directive? - - - - No - Patient declined No - Patient declined -  Pre-existing out of facility DNR order (yellow form or pink MOST form) - - - - - - -    Current Medications (verified) Outpatient Encounter Medications as of 02/24/2021  Medication Sig  . acetaminophen (TYLENOL) 500 MG tablet Take 1 tablet (500 mg total) by mouth every 6 (six) hours as needed for mild pain, moderate pain or headache. Reported on 05/10/2016  . Alcohol Swabs (ALCOHOL WIPES) 70 % PADS by Does not apply route. Use with blood sugar check and injection of insulin  . blood glucose meter kit and supplies KIT Dispense based on patient and insurance preference. Use up to four times daily as directed. (FOR ICD-9 250.00, 250.01).  . Cholecalciferol (VITAMIN D3) 25 MCG (1000 UT) CAPS Take 1 capsule by mouth daily.  . clonazePAM (KLONOPIN) 0.5 MG tablet Take 0.5 mg by mouth at bedtime.   . cloNIDine (CATAPRES) 0.1 MG tablet Take 0.1 mg by mouth. Take one tablet by mouth when provider instructs you to take  . glucose blood test strip Use as instructed to check blood sugars 3 times a day. Dx code: e11.65  . latanoprost (XALATAN) 0.005 % ophthalmic solution Place 1 drop into both eyes at bedtime.  . metFORMIN (GLUCOPHAGE) 500 MG tablet Take 1 tablet (500 mg total) by mouth daily after lunch.  . Multiple Vitamins-Minerals (CENTRUM SILVER 50+WOMEN) TABS Take 1 tablet by mouth daily.  [name] olmesartan (BENICAR) 20 MG tablet TAKE 1 TABLET (  20 MG TOTAL) BY MOUTH DAILY.  [name] diclofenac Sodium (VOLTAREN) 1 % GEL Apply 2 g topically 4 (four) times daily. (Patient not taking: Reported on 02/24/2021)  . diclofenac Sodium (VOLTAREN) 1 % GEL Apply 2 g topically 4 (four) times daily. (Patient not taking: Reported on 02/24/2021)   No facility-administered encounter medications on file as of 02/24/2021.    Allergies (verified) Aspirin, Hydroxyzine, and Valium [diazepam]   History: Past Medical History:   Diagnosis Date  . Anxiety   . Constipation   . Depression    "following husband's death"  . Diabetes mellitus without complication ([place])   . Hypertension   . KNEE PAIN, BILATERAL 02/13/2009   Qualifier: Diagnosis of  By: Hassell Done FNP, Tori Milks    . Tinnitus    Past Surgical History:  Procedure Laterality Date  . ABDOMINAL HYSTERECTOMY    . COLON RESECTION N/A 08/07/2019   Procedure: DIAGNOSTIC LAPAROSCOPY WITH OPEN LEFT INGUINAL HERNIA WITH MESH;  Surgeon: Johnathan Hausen, MD;  Location: [facility];  Service: General;  Laterality: N/A;  . fribroid surgery     Family History  Problem Relation Age of Onset  . Diabetes Mother   . Hypertension Father    Social History   Socioeconomic History  . Marital status: Widowed    Spouse name: Not on file  . Number of children: 0  . Years of education: 9  . Highest education level: Not on file  Occupational History  . Occupation: employed  Tobacco Use  . Smoking status: Never Smoker  . Smokeless tobacco: Never Used  Vaping Use  . Vaping Use: Never used  Substance and Sexual Activity  . Alcohol use: No  . Drug use: No  . Sexual activity: Not Currently  Other Topics Concern  . Not on file  Social History Narrative   Lives alone, husband passed away 2 1/2 years ago   Caffeine use- none   Social Determinants of Health   Financial Resource Strain: Medium Risk  . Difficulty of Paying Living Expenses: Somewhat hard  Food Insecurity: No Food Insecurity  . Worried About Charity fundraiser in the Last Year: Never true  . Ran Out of Food in the Last Year: Never true  Transportation Needs: No Transportation Needs  . Lack of Transportation (Medical): No  . Lack of Transportation (Non-Medical): No  Physical Activity: Inactive  . Days of Exercise per Week: 0 days  . Minutes of Exercise per Session: 0 min  Stress: No Stress Concern Present  . Feeling of Stress : Not at all  Social Connections: Not on file    Tobacco  Counseling Counseling given: Not Answered   Clinical Intake:  Pre-visit preparation completed: Yes  Pain : 0-10 Pain Score: 6  Pain Type: Chronic pain Pain Location:  (left knee and right shoulder) Pain Descriptors / Indicators: Aching Pain Onset: More than a month ago Pain Frequency: Intermittent     Nutritional Status: BMI > 30  Obese Nutritional Risks: None Diabetes: Yes  How often do you need to have someone help you when you read instructions, pamphlets, or other written materials from your doctor or pharmacy?: 1 - Never What is the last grade level you completed in school?: [employer]  Diabetic? Nutrition Risk Assessment:  Has the patient had any N/V/D within the last 2 months?  No  Does the patient have any non-healing wounds?  No  Has the patient had any unintentional weight loss or weight gain?  No  Diabetes:  Is the patient diabetic?  Yes  If diabetic, was a CBG obtained today?  No  Did the patient bring in their glucometer from home?  No  How often do you monitor your CBG's? daily.   Financial Strains and Diabetes Management:  Are you having any financial strains with the device, your supplies or your medication? No .  Does the patient want to be seen by Chronic Care Management for management of their diabetes?  No  Would the patient like to be referred to a Nutritionist or for Diabetic Management?  No   Diabetic Exams:  Diabetic Eye Exam: Completed 07/15/2020 Diabetic Foot Exam: Completed 05/18/2020   Interpreter Needed?: No  Information entered by :: NAllen LPN   Activities of Daily Living In your present state of health, do you have any difficulty performing the following activities: 02/24/2021 01/20/2021  Hearing? N N  Vision? N N  Difficulty concentrating or making decisions? N N  Walking or climbing stairs? N N  Dressing or bathing? N N  Doing errands, shopping? N N  Preparing Food and eating ? N -  Using the Toilet? N -  In the  past six months, have you accidently leaked urine? N -  Do you have problems with loss of bowel control? N -  Managing your Medications? N -  Managing your Finances? N -  Housekeeping or managing your Housekeeping? N -  Some recent data might be hidden    Patient Care Team: [name], FNP as PCP - General (General Practice) Mayford Knife, [facility] (Pharmacist)  Indicate any recent Medical Services you may have received from other than [facility] providers in the past year (date may be approximate).     Assessment:   This is a routine wellness examination for [place].  Hearing/Vision screen  Hearing Screening   '125Hz'  '250Hz'  '500Hz'  '1000Hz'  '2000Hz'  '3000Hz'  '4000Hz'  '6000Hz'  '8000Hz'   Right ear:           Left ear:           Vision Screening Comments: No regular eye exams,none  Dietary issues and exercise activities discussed: Current Exercise Habits: The patient does not participate in regular exercise at present  Goals    .  "I need hearing aids" (pt-stated)      CARE PLAN ENTRY (see longitudinal plan of care for additional care plan information)  Current Barriers:  . Financial constraints related to cost of hearing aids . Limited knowledge of health plan benefits . Barriers surrounding the self health management of DM II and HTN   Social Work Clinical Goal(s):  [name] Over the next 45 days the patient will work with SW to better understand health plan benefits pertaining to coverage of hearing aid costs  CCM SW Interventions: Completed 08/10/20 . Successful outbound call placed to the patient to assess goal progression . Determined the patient did receive her hearing aids  o The patient reports she has experienced headaches while wearing the hearing aids o The patient has been back to her audiologist to have the hearing aids adjusted o The patient reports she continues to experience some headaches and will wear the hearing aids intermittently in an effort to avoid headache . Advised the  patient SW would collaborate with RN Care Manager regarding complaints of headache . Collaboration with RN Care Manager regarding headaches while using hearing aids  Completed 07/27/20 . Successful outbound call placed to the patient to assess goal progression . Determined the patient did successfully  attend appointment on 8/24 to have hearing test completed o Patient has scheduled appointment September 2nd to follow up and receive hearing aids . Discussed the patient has felt ringing in her ears which she has contacted [facility] about . Encouraged the patient to continue accessing [facility] as needed for in home services to avoid ED visits . Scheduled follow up call over the next two weeks to assess outcome of follow up appointment and confirm hearing aids received  Completed 07/13/20 . Successful outbound call placed to the patient to assess transportation needs for upcoming appointment . Confirmed the patient has secured transportation and does not need SW intervention for 8/24 appointment . SW will follow up with the patient over the next two weeks   Completed 07/02/20 . Successful outbound call placed to the patient to assist with navigating health plan benefits related to obtaining a hearing aid . Collaborative call placed to the patients health plan to determine the patient hearing aid benefits o Benefit of a $3600 credit with a $0 co-pay amount every two years to obtain up to two devices every two years . Identified four providers within network close to the patient home o Patient has selected [facility] [phone] . Successful outbound call placed to [facility] located at [address] [place] to arrange an appointment o Appointment scheduled for the patient to see Dr. Karma Greaser on Tuesday August 24th at 1:00 pm. Patient advised to arrive by 12:45pm o Advised to contact the patients health plan portal [phone])  to arrange referral prior to  appointment . Successful outbound call to patients health plan portal to give appointment information and obtain patient ID ([id number]) o Informed by representative the registration was completed and referral would be sent to authorize services at [facility] . Confirmed appointment address and time with the patient . Assessed for transportation barriers to upcoming appointment o The patient reports she will try to arrange transportation via a friend o Advised the patient SW can assist with transportation arrangements via health plan benefit if needed . Scheduled follow up call over the next two weeks to remind the patient of her appointment and confirm transportation is arranged . Collaboration with embedded PharmD and RN Care Manager to inform of goal progression  Patient Self Care Activities:  . Self administers medications as prescribed . Calls pharmacy for medication refills . Performs ADL's independently . Calls provider office for new concerns or questions  Please see past updates related to this goal by clicking on the "Past Updates" button in the selected goal      .  Cope with Pain-Osteoarthritis      Timeframe:  Long-Range Goal Priority:  High Start Date:  11/10/20                          Expected End Date:  02/08/21                 Follow Up Date 01/21/21   - use distraction techniques - use relaxation during pain    Why is this important?    Living with joint pain and enjoying your life may be hard.   Feelings like depression or anger can make your pain worse.   Learning ways to cope may help you find some relief from the pain.    Notes:     .  Exercise 150 min/wk Moderate Activity (pt-stated)    .  Make and Keep All Appointments      Timeframe:  Long-Range Goal Priority:  High Start Date: 11/10/20                        Expected End Date: 02/08/21                     Follow Up Date 01/21/21    - arrange a ride through an agency 1 week before  appointment - keep a calendar with appointment dates    Why is this important?    Part of staying healthy is seeing the doctor for follow-up care.   If you forget your appointments, there are some things you can do to stay on track.    Notes:     [name]  [place] My Quality of Life      Timeframe:  Long-Range Goal Priority:  High Start Date: 11/10/20                            Expected End Date: 02/08/21                      Follow Up Date  01/21/21   - keep all MD appointments when scheduled - take all medications exactly as prescribed - call MD office to report concerns or change in condition  - discuss my treatment options with the doctor or nurse - make shared treatment decisions with doctor - stay active and get plenty of sleep    Why is this important?    Having a long-term illness can be scary.   It can also be stressful for you and your caregiver.   These steps may help.    Notes:     [name]  Monitor and Manage My Blood Sugar-Diabetes Type 2      Timeframe:  Long-Range Goal Priority:  High Start Date: 11/10/20                            Expected End Date:  02/08/21                     Follow Up Date 01/21/21  - Self administers oral medications as prescribed - Attends all scheduled provider appointments - Checks blood sugars as prescribed and utilize hyper and hypoglycemia protocol as needed - Adheres to prescribed ADA/carb modified - check blood sugar before and after exercise - check blood sugar if I feel it is too high or too low - take the blood sugar log to all doctor visits - take the blood sugar meter to all doctor visits    Why is this important?    Checking your blood sugar at home helps to keep it from getting very high or very low.   Writing the results in a diary or log helps the doctor know how to care for you.   Your blood sugar log should have the time, date and the results.   Also, write down the amount of insulin or other medicine that you  take.   Other information, like what you ate, exercise done and how you were feeling, will also be helpful.     Notes:     .  Patient Stated      01/15/2020, wants to increase water intake, exercise and eat healthy    .  Patient Stated      02/24/2021, no goals    .  Pharmacy Care Plan      CARE PLAN ENTRY (see longitudinal plan of care for additional care plan information)  Current Barriers:  . Chronic Disease Management support, education, and care coordination needs related to Hypertension, Diabetes, Depression, and Anxiety   Hypertension BP Readings from Last 3 Encounters:  05/18/20 136/86  04/05/20 131/73  01/15/20 126/84   . Pharmacist Clinical Goal(s): o Over the next 90 days, patient will work with PharmD and providers to achieve BP goal <130/80 . Current regimen:  o Olmesartan 70m daily . Interventions: o Stressed importance of medication adherence o Provided dietary and exercise recommendations o Patient to have BP checks with CPA to make her more comfortable with checking her BP  . Patient self care activities - Over the next 90 days, patient will: o Check BP several times weekly, document, and provide at future appointments o Ensure daily salt intake < 2300 mg/day o Exercise 30 minutes a day 5 day per week  Diabetes Lab Results  Component Value Date/Time   HGBA1C 5.9 (H) 11/11/2020 12:32 PM   HGBA1C 6.3 (H) 05/18/2020 11:31 AM   . Pharmacist Clinical Goal(s): o Over the next 90 days, patient will work with PharmD and providers to maintain A1c goal <7% . Current regimen:  o Metformin 5088monce daily after lunch . Interventions: o Provided dietary and exercise recommendations o Provided patient with Daily Diabetes Meal Planning Guide and blood glucose log . Patient self care activities - Over the next 90 days, patient will: o Check blood sugar every 3-4 days and if symptomatic, document, and provide at future appointments if able to obtain strips from  pharmacy o Contact provider with any episodes of hypoglycemia  Knee Pain . Pharmacist Clinical Goal(s) o Over the next 30 days, patient will work with PharmD and providers to relieve knee pain . Current regimen:  o Diclofenac 2g 4 times daily . Interventions: o Determined diclofenac has been helping with patient's knee pain . Patient self care activities - Over the next 30 days, patient will: o Continue using diclofenac for knee pain  Anxiety . Pharmacist Clinical Goal(s) o Over the next 30 days, patient will work with PharmD and providers to keep her anxiety down  . Current regimen:  o Clonazepam 0.5 mg- take twice daily  . Interventions: o Patient takes Clonazepam 0.5 mg as needed for anxiety  . Patient self care activities - Over the next 30 days, patient will: o Continue taking anxiety medication as needed   Medication management . Pharmacist Clinical Goal(s): o Over the next 90 days, patient will work with PharmD and providers to achieve optimal medication adherence . [facility] Interventions o Comprehensive medication review performed. o Continue current medication management strategy . Patient self care activities - Over the next 90 days, patient will: o Focus on medication adherence by considering use of pill box o Consider adherence packaging and delivery available at [facility] o Take medications as prescribed o Report any questions or concerns to PharmD and/or provider(s)  Please see past updates related to this goal by clicking on the "Past Updates" button in the selected goal       .  Track and Manage My Blood Pressure-Hypertension      Timeframe:  Long-Range Goal Priority:  High Start Date: 11/10/20  Expected End Date:  02/08/21                     Follow Up Date 01/21/21    - check blood pressure 3 times per week - write blood pressure results in a log or diary  - Self administers  medications as prescribed - Attends all scheduled provider appointments - Calls provider office for new concerns, questions, or BP outside discussed parameters - Follows a low sodium diet/DASH diet   Why is this important?    You won't feel high blood pressure, but it can still hurt your blood vessels.   High blood pressure can cause heart or kidney problems. It can also cause a stroke.   Making lifestyle changes like losing a little weight or eating less salt will help.   Checking your blood pressure at home and at different times of the day can help to control blood pressure.   If the doctor prescribes medicine remember to take it the way the doctor ordered.   Call the office if you cannot afford the medicine or if there are questions about it.     Notes:       Depression Screen PHQ 2/9 Scores 02/24/2021 01/20/2021 01/15/2020 05/09/2019 04/11/2019 01/10/2019 10/04/2018  PHQ - 2 Score 0 0 0 0 0 0 0  PHQ- 9 Score - - 3 - - - -    Fall Risk Fall Risk  02/24/2021 01/20/2021 01/15/2020 05/09/2019 01/10/2019  Falls in the past year? 0 0 0 0 0  Comment - - - - -  Risk for fall due to : Medication side effect - Medication side effect - -  Follow up Falls evaluation completed;Education provided;Falls prevention discussed - Falls evaluation completed;Education provided;Falls prevention discussed - -    FALL RISK PREVENTION PERTAINING TO THE HOME:  Any stairs in or around the home? No  If so, are there any without handrails? n/a Home free of loose throw rugs in walkways, pet beds, electrical cords, etc? Yes  Adequate lighting in your home to reduce risk of falls? Yes   ASSISTIVE DEVICES UTILIZED TO PREVENT FALLS:  Life alert? No  Use of a cane, walker or w/c? No  Grab bars in the bathroom? No  Shower chair or bench in shower? No  Elevated toilet seat or a handicapped toilet? No   TIMED UP AND GO:  Was the test performed? No .      Cognitive Function:     6CIT Screen 02/24/2021  01/15/2020 10/04/2018  What Year? 0 points 0 points 0 points  What month? 0 points 0 points 0 points  What time? 0 points 0 points 0 points  Count back from 20 0 points 0 points 0 points  Months in reverse 0 points 2 points 0 points  Repeat phrase 10 points 6 points 0 points  Total Score 10 8 0    Immunizations Immunization History  Administered Date(s) Administered  . Fluad Quad(high Dose 65+) 11/11/2020  . Influenza Whole 11/18/2009  . Influenza-Unspecified 09/24/2018  . PFIZER(Purple Top)SARS-COV-2 Vaccination 02/14/2020, 03/10/2020, 10/28/2020  . Pneumococcal Conjugate-13 01/16/2020  . Pneumococcal Polysaccharide-23 02/01/2007  . Td 02/01/2007  . Tdap 01/09/2020    TDAP status: Up to date  Flu Vaccine status: Up to date  Pneumococcal vaccine status: Up to date  Covid-19 vaccine status: Completed vaccines  Qualifies for Shingles Vaccine? Yes   Zostavax completed No   Shingrix Completed?: No.    Education has been  provided regarding the importance of this vaccine. Patient has been advised to call insurance company to determine out of pocket expense if they have not yet received this vaccine. Advised may also receive vaccine at local pharmacy or Health Dept. Verbalized acceptance and understanding.  Screening Tests Health Maintenance  Topic Date Due  . DEXA SCAN  Never done  . HEMOGLOBIN A1C  05/12/2021  . FOOT EXAM  05/18/2021  . OPHTHALMOLOGY EXAM  07/15/2021  . TETANUS/TDAP  01/08/2030  . INFLUENZA VACCINE  Completed  . COVID-19 Vaccine  Completed  . Hepatitis C Screening  Completed  . PNA vac Low Risk Adult  Completed  . HPV VACCINES  Aged Out    Health Maintenance  Health Maintenance Due  Topic Date Due  . DEXA SCAN  Never done    Colorectal cancer screening: No longer required.   Mammogram status: No longer required due to age.  Bone Density status: due  Lung Cancer Screening: (Low Dose CT Chest recommended if Age 34-80 years, 30 pack-year currently  smoking OR have quit w/in 15years.) does not qualify.   Lung Cancer Screening Referral: no  Additional Screening:  Hepatitis C Screening: does qualify; Completed 05/18/2020  Vision Screening: Recommended annual ophthalmology exams for early detection of glaucoma and other disorders of the eye. Is the patient up to date with their annual eye exam?  Yes  Who is the provider or what is the name of the office in which the patient attends annual eye exams?  If pt is not established with a provider, would they like to be referred to a provider to establish care? No .   Dental Screening: Recommended annual dental exams for proper oral hygiene  Community Resource Referral / Chronic Care Management: CRR required this visit?  No   CCM required this visit?  No      Plan:     I have personally reviewed and noted the following in the patient's chart:   . Medical and social history . Use of alcohol, tobacco or illicit drugs  . Current medications and supplements . Functional ability and status . Nutritional status . Physical activity . Advanced directives . List of other physicians . Hospitalizations, surgeries, and ER visits in previous 12 months . Vitals . Screenings to include cognitive, depression, and falls . Referrals and appointments  In addition, I have reviewed and discussed with patient certain preventive protocols, quality metrics, and best practice recommendations. A written personalized care plan for preventive services as well as general preventive health recommendations were provided to patient.     Kellie Simmering, LPN   5/68/1275   Nurse Notes:

## 2021-02-24 NOTE — Patient Instructions (Signed)
Allison Franco , Thank you for taking time to come for your Medicare Wellness Visit. I appreciate your ongoing commitment to your health goals. Please review the following plan we discussed and let me know if I can assist you in the future.   Screening recommendations/referrals: Colonoscopy: not required Mammogram: not required Bone Density: rescheduled Recommended yearly ophthalmology/optometry visit for glaucoma screening and checkup Recommended yearly dental visit for hygiene and checkup  Vaccinations: Influenza vaccine: completed 11/11/2020, due 06/28/2021 Pneumococcal vaccine: completed 01/16/2020 Tdap vaccine: completed 01/09/2020, due 01/08/2030 Shingles vaccine: discussed   Covid-19: 10/28/2020, 03/10/2020, 02/14/2020  Advanced directives: copy in chart  Conditions/risks identified: none  Next appointment: Follow up in one year for your annual wellness visit    Preventive Care 80 Years and Older, Female Preventive care refers to lifestyle choices and visits with your health care provider that can promote health and wellness. What does preventive care include?  A yearly physical exam. This is also called an annual well check.  Dental exams once or twice a year.  Routine eye exams. Ask your health care provider how often you should have your eyes checked.  Personal lifestyle choices, including:  Daily care of your teeth and gums.  Regular physical activity.  Eating a healthy diet.  Avoiding tobacco and drug use.  Limiting alcohol use.  Practicing safe sex.  Taking low-dose aspirin every day.  Taking vitamin and mineral supplements as recommended by your health care provider. What happens during an annual well check? The services and screenings done by your health care provider during your annual well check will depend on your age, overall health, lifestyle risk factors, and family history of disease. Counseling  Your health care provider may ask you questions about  your:  Alcohol use.  Tobacco use.  Drug use.  Emotional well-being.  Home and relationship well-being.  Sexual activity.  Eating habits.  History of falls.  Memory and ability to understand (cognition).  Work and work Statistician.  Reproductive health. Screening  You may have the following tests or measurements:  Height, weight, and BMI.  Blood pressure.  Lipid and cholesterol levels. These may be checked every 5 years, or more frequently if you are over 72 years old.  Skin check.  Lung cancer screening. You may have this screening every year starting at age 40 if you have a 30-pack-year history of smoking and currently smoke or have quit within the past 15 years.  Fecal occult blood test (FOBT) of the stool. You may have this test every year starting at age 34.  Flexible sigmoidoscopy or colonoscopy. You may have a sigmoidoscopy every 5 years or a colonoscopy every 10 years starting at age 36.  Hepatitis C blood test.  Hepatitis B blood test.  Sexually transmitted disease (STD) testing.  Diabetes screening. This is done by checking your blood sugar (glucose) after you have not eaten for a while (fasting). You may have this done every 1-3 years.  Bone density scan. This is done to screen for osteoporosis. You may have this done starting at age 33.  Mammogram. This may be done every 1-2 years. Talk to your health care provider about how often you should have regular mammograms. Talk with your health care provider about your test results, treatment options, and if necessary, the need for more tests. Vaccines  Your health care provider may recommend certain vaccines, such as:  Influenza vaccine. This is recommended every year.  Tetanus, diphtheria, and acellular pertussis (Tdap, Td) vaccine. You  may need a Td booster every 10 years.  Zoster vaccine. You may need this after age 103.  Pneumococcal 13-valent conjugate (PCV13) vaccine. One dose is recommended  after age 57.  Pneumococcal polysaccharide (PPSV23) vaccine. One dose is recommended after age 45. Talk to your health care provider about which screenings and vaccines you need and how often you need them. This information is not intended to replace advice given to you by your health care provider. Make sure you discuss any questions you have with your health care provider. Document Released: 12/11/2015 Document Revised: 08/03/2016 Document Reviewed: 09/15/2015 Elsevier Interactive Patient Education  2017 [employer] Prevention in the Home Falls can cause injuries. They can happen to people of all ages. There are many things you can do to make your home safe and to help prevent falls. What can I do on the outside of my home?  Regularly fix the edges of walkways and driveways and fix any cracks.  Remove anything that might make you trip as you walk through a door, such as a raised step or threshold.  Trim any bushes or trees on the path to your home.  Use bright outdoor lighting.  Clear any walking paths of anything that might make someone trip, such as rocks or tools.  Regularly check to see if handrails are loose or broken. Make sure that both sides of any steps have handrails.  Any raised decks and porches should have guardrails on the edges.  Have any leaves, snow, or ice cleared regularly.  Use sand or salt on walking paths during winter.  Clean up any spills in your garage right away. This includes oil or grease spills. What can I do in the bathroom?  Use night lights.  Install grab bars by the toilet and in the tub and shower. Do not use towel bars as grab bars.  Use non-skid mats or decals in the tub or shower.  If you need to sit down in the shower, use a plastic, non-slip stool.  Keep the floor dry. Clean up any water that spills on the floor as soon as it happens.  Remove soap buildup in the tub or shower regularly.  Attach bath mats securely with  double-sided non-slip rug tape.  Do not have throw rugs and other things on the floor that can make you trip. What can I do in the bedroom?  Use night lights.  Make sure that you have a light by your bed that is easy to reach.  Do not use any sheets or blankets that are too big for your bed. They should not hang down onto the floor.  Have a firm chair that has side arms. You can use this for support while you get dressed.  Do not have throw rugs and other things on the floor that can make you trip. What can I do in the kitchen?  Clean up any spills right away.  Avoid walking on wet floors.  Keep items that you use a lot in easy-to-reach places.  If you need to reach something above you, use a strong step stool that has a grab bar.  Keep electrical cords out of the way.  Do not use floor polish or wax that makes floors slippery. If you must use wax, use non-skid floor wax.  Do not have throw rugs and other things on the floor that can make you trip. What can I do with my stairs?  Do not leave any items  on the stairs.  Make sure that there are handrails on both sides of the stairs and use them. Fix handrails that are broken or loose. Make sure that handrails are as long as the stairways.  Check any carpeting to make sure that it is firmly attached to the stairs. Fix any carpet that is loose or worn.  Avoid having throw rugs at the top or bottom of the stairs. If you do have throw rugs, attach them to the floor with carpet tape.  Make sure that you have a light switch at the top of the stairs and the bottom of the stairs. If you do not have them, ask someone to add them for you. What else can I do to help prevent falls?  Wear shoes that:  Do not have high heels.  Have rubber bottoms.  Are comfortable and fit you well.  Are closed at the toe. Do not wear sandals.  If you use a stepladder:  Make sure that it is fully opened. Do not climb a closed stepladder.  Make  sure that both sides of the stepladder are locked into place.  Ask someone to hold it for you, if possible.  Clearly mark and make sure that you can see:  Any grab bars or handrails.  First and last steps.  Where the edge of each step is.  Use tools that help you move around (mobility aids) if they are needed. These include:  Canes.  Walkers.  Scooters.  Crutches.  Turn on the lights when you go into a dark area. Replace any light bulbs as soon as they burn out.  Set up your furniture so you have a clear path. Avoid moving your furniture around.  If any of your floors are uneven, fix them.  If there are any pets around you, be aware of where they are.  Review your medicines with your doctor. Some medicines can make you feel dizzy. This can increase your chance of falling. Ask your doctor what other things that you can do to help prevent falls. This information is not intended to replace advice given to you by your health care provider. Make sure you discuss any questions you have with your health care provider. Document Released: 09/10/2009 Document Revised: 04/21/2016 Document Reviewed: 12/19/2014 Elsevier Interactive Patient Education  2017 [employer].

## 2021-03-02 ENCOUNTER — Telehealth: Payer: Self-pay

## 2021-03-02 NOTE — Telephone Encounter (Signed)
   Telephone encounter was:  Successful.  03/02/2021 Name: Jakeia Carreras MRN: [id number] DOB: [birth year]  Anastazja Isaac is a 80 y.o. year old female who is a primary care patient of [name], [place] . The community resource team was consulted for assistance with utility assistance, spectrum bill  Care guide performed the following interventions: Patient provided with information about care guide support team and interviewed to confirm resource needs  Spoke with client to let her know I am working on her referral for assistance with utility bill and spectrum and would call her in the next few days..  Follow Up Plan:  Care guide will follow up with patient by phone over the next 7 days   , AAS Paralegal, [place] . Embedded Care Coordination [facility]  [facility] Management  [address], [place] [address] ??[email or web address]  ?? [phone]   [email or web address]

## 2021-03-03 NOTE — Patient Instructions (Signed)
Goals Addressed      Patient Stated   .  COMPLETED: "I need hearing aids" (pt-stated)        CARE PLAN ENTRY (see longitudinal plan of care for additional care plan information)  Current Barriers:  . Financial constraints related to cost of hearing aids . Limited knowledge of health plan benefits . Barriers surrounding the self health management of DM II and HTN  Social Work Clinical Goal(s):  [name] Over the next 45 days the patient will work with SW to better understand health plan benefits pertaining to coverage of hearing aid costs CCM SW Interventions: Completed 08/10/20 . Successful outbound call placed to the patient to assess goal progression . Determined the patient did receive her hearing aids  o The patient reports she has experienced headaches while wearing the hearing aids o The patient has been back to her audiologist to have the hearing aids adjusted o The patient reports she continues to experience some headaches and will wear the hearing aids intermittently in an effort to avoid headache . Advised the patient SW would collaborate with RN Care Manager regarding complaints of headache . Collaboration with RN Care Manager regarding headaches while using hearing aids Completed 07/27/20 . Successful outbound call placed to the patient to assess goal progression . Determined the patient did successfully attend appointment on 8/24 to have hearing test completed o Patient has scheduled appointment September 2nd to follow up and receive hearing aids . Discussed the patient has felt ringing in her ears which she has contacted [facility] about . Encouraged the patient to continue accessing [facility] as needed for in home services to avoid ED visits . Scheduled follow up call over the next two weeks to assess outcome of follow up appointment and confirm hearing aids received  Completed 07/13/20 . Successful outbound call placed to the patient to assess transportation needs for  upcoming appointment . Confirmed the patient has secured transportation and does not need SW intervention for 8/24 appointment . SW will follow up with the patient over the next two weeks   Completed 07/02/20 . Successful outbound call placed to the patient to assist with navigating health plan benefits related to obtaining a hearing aid . Collaborative call placed to the patients health plan to determine the patient hearing aid benefits o Benefit of a $3600 credit with a $0 co-pay amount every two years to obtain up to two devices every two years . Identified four providers within network close to the patient home o Patient has selected [facility] [phone] . Successful outbound call placed to [facility] located at [address] [place] to arrange an appointment o Appointment scheduled for the patient to see Dr. Karma Greaser on Tuesday August 24th at 1:00 pm. Patient advised to arrive by 12:45pm o Advised to contact the patients health plan portal [phone])  to arrange referral prior to appointment . Successful outbound call to patients health plan portal to give appointment information and obtain patient ID ([id number]) o Informed by representative the registration was completed and referral would be sent to authorize services at [facility] . Confirmed appointment address and time with the patient . Assessed for transportation barriers to upcoming appointment o The patient reports she will try to arrange transportation via a friend o Advised the patient SW can assist with transportation arrangements via health plan benefit if needed . Scheduled follow up call over the next two weeks to remind the patient  of her appointment and confirm transportation is arranged . Collaboration with embedded PharmD and RN Care Manager to inform of goal progression  RN CM Clinical Update/Interventions: 02/23/21  successful call completed with patient  . Determined patient  received her hearing aids, although, this has not improved her hearing or the "ringing" in her ears . Determined patient will plan to follow up with PCP [name] FNP to discuss this condition further, she does not wish to f/u with ENT or audiology at this time . Discussed next scheduled PCP follow up scheduled for 04/19/21 @9 :15 AM   Patient Self Care Activities:  . Self administers medications as prescribed . Calls pharmacy for medication refills . Performs ADL's independently . Calls provider office for new concerns or questions  Please see past updates related to this goal by clicking on the "Past Updates" button in the selected goal        Other   .  Cope with Pain-Osteoarthritis   On track     Timeframe:  Long-Range Goal Priority:  High Start Date:  11/10/20                          Expected End Date:  02/08/21                 Follow Up Date 01/21/21   - use distraction techniques - use relaxation during pain    Why is this important?    Living with joint pain and enjoying your life may be hard.   Feelings like depression or anger can make your pain worse.   Learning ways to cope may help you find some relief from the pain.    Notes:     [name]  Make and Keep All Appointments   On track     Timeframe:  Long-Range Goal Priority:  High Start Date: 11/10/20                        Expected End Date: 05/11/21                     Follow Up Date: 04/22/21   - arrange a ride through an agency 1 week before appointment - keep a calendar with appointment dates    Why is this important?    Part of staying healthy is seeing the doctor for follow-up care.   If you forget your appointments, there are some things you can do to stay on track.    Notes:     [name]  [facility] My Quality of Life   On track     Timeframe:  Long-Range Goal Priority:  High Start Date: 11/10/20                            Expected End Date: 05/11/21                      Follow Up Date: 04/22/21    -  keep all MD appointments when scheduled - take all medications exactly as prescribed - call MD office to report concerns or change in condition  - discuss my treatment options with the doctor or nurse - make shared treatment decisions with doctor - stay active and get plenty of sleep    Why is this important?    Having a long-term illness can be scary.  It can also be stressful for you and your caregiver.   These steps may help.    Notes:     [name]  Monitor and Manage My Blood Sugar-Diabetes Type 2   On track     Timeframe:  Long-Range Goal Priority:  High Start Date: 11/10/20                            Expected End Date:  05/11/21                     Follow Up Date: 04/22/21   - Self administers oral medications as prescribed - Attends all scheduled provider appointments - Checks blood sugars as prescribed and utilize hyper and hypoglycemia protocol as needed - Adheres to prescribed ADA/carb modified - check blood sugar before and after exercise - check blood sugar if I feel it is too high or too low - take the blood sugar log to all doctor visits - take the blood sugar meter to all doctor visits    Why is this important?    Checking your blood sugar at home helps to keep it from getting very high or very low.   Writing the results in a diary or log helps the doctor know how to care for you.   Your blood sugar log should have the time, date and the results.   Also, write down the amount of insulin or other medicine that you take.   Other information, like what you ate, exercise done and how you were feeling, will also be helpful.     Notes:     .  Track and Manage My Blood Pressure-Hypertension   On track     Timeframe:  Long-Range Goal Priority:  High Start Date: 11/10/20                            Expected End Date:  05/11/21                     Follow Up Date: 04/22/21    - check blood pressure 3 times per week - write blood pressure results in a log or diary  -  Self administers medications as prescribed - Attends all scheduled provider appointments - Calls provider office for new concerns, questions, or BP outside discussed parameters - Follows a low sodium diet/DASH diet   Why is this important?    You won't feel high blood pressure, but it can still hurt your blood vessels.   High blood pressure can cause heart or kidney problems. It can also cause a stroke.   Making lifestyle changes like losing a  weight or eating less salt will help.   Checking your blood pressure at home and at different times of the day can help to control blood pressure.   If the doctor prescribes medicine remember to take it the way the doctor ordered.   Call the office if you cannot afford the medicine or if there are questions about it.     Notes:

## 2021-03-03 NOTE — Chronic Care Management (AMB) (Signed)
Chronic Care Management   CCM RN Visit Note  02/23/2021 Name: Allison Franco MRN: [id number] DOB: 02/04/1941  Subjective: Allison Franco is a 80 y.o. year old female who is a primary care patient of [name], [place]. The care management team was consulted for assistance with disease management and care coordination needs.    Engaged with patient by telephone for follow up visit in response to provider referral for case management and/or care coordination services.   Consent to Services:  The patient was given information about Chronic Care Management services, agreed to services, and gave verbal consent prior to initiation of services.  Please see initial visit note for detailed documentation.   Patient agreed to services and verbal consent obtained.   Assessment: Review of patient past medical history, allergies, medications, health status, including review of consultants reports, laboratory and other test data, was performed as part of comprehensive evaluation and provision of chronic care management services.   SDOH (Social Determinants of Health) assessments and interventions performed:    CCM Care Plan  Allergies  Allergen Reactions  . Aspirin Other (See Comments)    insomnia  . Hydroxyzine Other (See Comments)    insomnia  . Valium [Diazepam] Other (See Comments)    Insomnia, 05/10/16 pt states she took as young person- made her depressed    Outpatient Encounter Medications as of 02/23/2021  Medication Sig  . acetaminophen (TYLENOL) 500 MG tablet Take 1 tablet (500 mg total) by mouth every 6 (six) hours as needed for mild pain, moderate pain or headache. Reported on 05/10/2016  . Alcohol Swabs (ALCOHOL WIPES) 70 % PADS by Does not apply route. Use with blood sugar check and injection of insulin  . blood glucose meter kit and supplies KIT Dispense based on patient and insurance preference. Use up to four times daily as directed. (FOR ICD-9 250.00, 250.01).  . Cholecalciferol  (VITAMIN D3) 25 MCG (1000 UT) CAPS Take 1 capsule by mouth daily.  . clonazePAM (KLONOPIN) 0.5 MG tablet Take 0.5 mg by mouth at bedtime.   . cloNIDine (CATAPRES) 0.1 MG tablet Take 0.1 mg by mouth. Take one tablet by mouth when provider instructs you to take  . diclofenac Sodium (VOLTAREN) 1 % GEL Apply 2 g topically 4 (four) times daily. (Patient not taking: Reported on 02/24/2021)  . glucose blood test strip Use as instructed to check blood sugars 3 times a day. Dx code: e11.65  . latanoprost (XALATAN) 0.005 % ophthalmic solution Place 1 drop into both eyes at bedtime.  . metFORMIN (GLUCOPHAGE) 500 MG tablet Take 1 tablet (500 mg total) by mouth daily after lunch.  . Multiple Vitamins-Minerals (CENTRUM SILVER 50+WOMEN) TABS Take 1 tablet by mouth daily.  . [DISCONTINUED] diclofenac Sodium (VOLTAREN) 1 % GEL Apply 2 g topically 4 (four) times daily. (Patient not taking: Reported on 02/24/2021)  . [DISCONTINUED] olmesartan (BENICAR) 20 MG tablet TAKE 1 TABLET (20 MG TOTAL) BY MOUTH DAILY.   No facility-administered encounter medications on file as of 02/23/2021.    Patient Active Problem List   Diagnosis Date Noted  . Primary open angle glaucoma (POAG) of both eyes, severe stage 07/16/2020  . Inguinal hernia with bowel obstruction 08/07/2019  . Incarcerated left inguinal hernia 08/07/2019  . Anemia 08/31/2018  . Essential hypertension 07/03/2018  . Type 2 diabetes mellitus without complication, without long-term current use of insulin ([place]) 07/02/2018  . Grief reaction 10/20/2013  . Obesity 10/20/2013  . ALLERGIC RHINITIS 08/11/2009  . INSOMNIA 07/17/2008  .  TIREDNESS 04/16/2008  . 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   Knowledge Deficits related to short term plan for care coordination needs and long term plans for chronic disease management needs Nurse Case Manager Clinical Goal(s):   Over the next 180 days, patient will work with care management team to address care coordination and chronic disease management needs related to Disease Management  Educational Needs  Care Coordination  Medication Management and Education  Psychosocial Support   Interventions:  02/23/21 completed successful call with patient  . Assessed for sensory issues that impact quality of life such as hearing loss, vision deficit; strategize ways to maintain or improve hearing, vision.  . Determined patient is now wearing her hearing aids but continues to suffer from "ringing" in her ears that is intermittent and bothersome . Determined patient plans to discuss this condition further with PCP provider [name] FNP during her next scheduled visit set for 04/19/21 '@9' :15 AM . Promoted activities to decrease social isolation such as group support or social, leisure and recreational activities, employment, use of social media; consider safety concerns about being out of home for activities.  . Promoted access to services in the community to support independence such as support groups, home visiting programs, financial assistance, handicapped parking tags, durable medical equipment and emergency responder.  . Discussed plans with patient for ongoing care management follow up and provided patient with direct contact information for care management team Patient Goals/Self Care Activities:  - keep all MD  appointments when scheduled - take all medications exactly as prescribed - call MD office to report concerns or change in condition  - discuss my treatment options with the doctor or nurse - make shared treatment decisions with doctor - stay active and get plenty of sleep  - arrange a ride through an agency 1 week before appointment - keep a calendar with appointment dates  Follow Up Plan: Telephone follow up appointment with care management team member scheduled for: 04/22/21   Care Plan : Diabetes Type 2 (Adult)  Updates made by Lynne Logan, RN since 03/03/2021 12:00 AM    Problem: Disease Progression (Diabetes, Type 2)   Priority: High    Long-Range Goal: Disease Progression Prevented or Minimized   Start Date: 11/10/2020  Expected End Date: 05/11/2021  This Visit's Progress: On track  Recent Progress: On track  Priority: High  Note:   Objective:  Lab Results  Component Value Date   HGBA1C 5.9 (H) 11/11/2020 .   Lab Results  Component Value Date   CREATININE 0.83 11/11/2020   CREATININE 0.86 05/18/2020   CREATININE 0.76 04/05/2020 .   [name] No results found for: EGFR Current Barriers:  [name] Knowledge Deficits related to basic Diabetes pathophysiology and self care/management . Knowledge Deficits related to medications used for management of diabetes . Limited Social Support . Lacks social connections  Case Manager Clinical Goal(s):  [name] Over the next 180 days, patient will demonstrate improved adherence to prescribed treatment plan for diabetes self care/management as evidenced by:  . adherence to ADA/ carb modified diet . adherence to prescribed medication regimen Interventions:  02/23/21 completed successful call with patient . Provided education to patient about basic DM disease process . Educated on dietary and exercise recommendations; daily glycemic control, FBS 80-130, <180 after meals; 15'15' rule . Review of patient status, including review of consultants reports,  relevant laboratory and other test results, and medications completed. . Reviewed medications with patient and discussed importance of medication adherence . Advised patient, providing education and rationale, to check cbg daily before meals calling the CCM team and or PCP for findings outside established parameters.   . Discussed plans with patient for ongoing care management follow up and provided patient with direct contact information for care management team Patient Goals/Self-Care Activities Self administers oral medications as prescribed Attends all scheduled provider appointments Checks blood sugars as prescribed and utilize hyper and hypoglycemia protocol as needed Adheres to prescribed ADA/carb modified - check blood sugar before and after exercise - check blood sugar if I feel it is too high or too low - take the blood sugar log to all doctor visits - take the blood sugar meter to all doctor visits  Follow Up Plan: Telephone follow up appointment with care management team member scheduled for: 04/22/21   Care Plan : Hypertension (Adult)  Updates made by Lynne Logan, RN since 03/03/2021 12:00 AM    Problem: Disease Progression (Hypertension)   Priority: High    Long-Range Goal: Disease Progression Prevented or Minimized   Start Date: 11/10/2020  Expected End Date: 05/11/2021  This Visit's Progress: On track  Recent Progress: On track  Priority: High  Note:   Objective:  . Last practice recorded BP readings:  BP Readings from Last 3 Encounters:  11/11/20 116/80  05/18/20 136/86  04/05/20 131/73 .   [name] Most recent eGFR/CrCl: No results found for: EGFR  No components found for: CRCL Current Barriers:  [name] Knowledge Deficits related to basic understanding of hypertension pathophysiology and self care management . Knowledge Deficits related to understanding of medications prescribed for management of hypertension . Difficulty obtaining medications . Limited Social  Support . Lacks social connections Case Manager Clinical Goal(s):  [name] Over the next 180 days, patient will verbalize basic understanding of hypertension disease process and self health management plan as evidenced by patient will feel more comfortable Self Checking her BP at home at least 3 times daily and recording her readings; she will take all prescribed BP medications exactly as prescribed  Interventions:  02/23/21 successful call completed with patient  . Evaluation of current treatment plan related to hypertension self management and patient's adherence to plan as established by provider. . Provided education to patient re: stroke prevention, s/s of heart attack and stroke, DASH diet, complications of uncontrolled blood pressure . Reviewed medications with patient and discussed importance of compliance . Assessed for and promoted awareness of worsening disease or development of comorbidity.  . Educated on dietary and exercise recommendations . Discussed plans with patient for ongoing care management follow up and provided patient with direct contact information for care management team Patient Goals/Self-Care Activities  Self administers medications as prescribed Attends all scheduled provider appointments Calls provider office for new concerns, questions, or BP outside discussed parameters Follows a low sodium diet/DASH diet - check blood pressure 3 times per  week - write blood pressure results in a log or diary  Follow Up Plan: Telephone follow up appointment with care management team member scheduled for: 04/22/21    Care Plan : Osteoarthritis (Adult)  Updates made by Lynne Logan, RN since 03/03/2021 12:00 AM    Problem: Pain (Osteoarthritis)   Priority: High    Long-Range Goal: Manage Pain   Start Date: 11/10/2020  Expected End Date: 05/11/2021  This Visit's Progress: On track  Recent Progress: On track  Priority: High  Note:   Current Barriers:   Ineffective Self  Health Maintenance  Lacks social connections  Currently UNABLE TO independently self manage needs related to chronic health conditions.   Knowledge Deficits related to short term plan for care coordination needs and long term plans for chronic disease management needs Nurse Case Manager Clinical Goal(s):   Over the next 180 days, patient will work with care management team to address care coordination and chronic disease management needs related to Disease Management  Educational Needs  Care Coordination  Medication Management and Education  Psychosocial Support   Interventions:  02/23/21 completed successful outbound call with patient  . Provided education to patient about basic disease process related to chronic pain . Review of patient status, including review of consultants reports, relevant laboratory and other test results, and medications completed. . Reviewed medications with patient and discussed importance of medication adherence . Assessed pain level, treatment efficacy and patient response at regular intervals using a consistent pain scale; review the effectiveness and tolerability of all treatments. . Encouraged nonpharmacologic measures, such as applied heat or cold, exercise, physical or occupational therapy, orthoses, cognitive behavioral therapy, yoga, tai-chi, acupuncture, adequate sleep and weight loss.  Promoted individualized plan to stay active when unable to participate in physical therapy, such as passive and active range of motion, yoga, walking, water aerobics or swimming.   Discussed plans with patient for ongoing care management follow up and provided patient with direct contact information for care management team Patient Goals/Self Care Activities:  - use distraction techniques - use relaxation during pain  Follow Up Plan: Telephone follow up appointment with care management team member scheduled for: 04/22/21     Plan:Telephone follow up appointment  with care management team member scheduled for:  04/22/21  Barb Merino, RN, BSN, CCM Care Management Coordinator [facility]  Direct Phone: [phone]

## 2021-03-10 ENCOUNTER — Telehealth: Payer: Self-pay

## 2021-03-10 NOTE — Telephone Encounter (Signed)
   Telephone encounter was:  Unsuccessful.  03/10/2021 Name: Allison Franco MRN: [id number] DOB: 1941-08-06  Unsuccessful outbound call made today to assist with:   Left message on voicemail for patient to return my call regarding emailed resources for assistance with paying electricity and spectrum  Outreach Attempt:  2nd Attempt  A HIPAA compliant voice message was left requesting a return call.  Instructed patient to call back at [phone].   , AAS Paralegal, [place] . Embedded Care Coordination [facility]  Care Management  [address], [place] [address] ??[email or web address]  ?? [phone]   [email or web address]

## 2021-03-15 ENCOUNTER — Telehealth: Payer: Self-pay

## 2021-03-15 NOTE — Telephone Encounter (Signed)
   Telephone encounter was:  Unsuccessful.  03/15/2021 Name: Allison Franco MRN: [id number] DOB: 1941/09/01  Unsuccessful outbound call made today to assist with:  Left message on voicemail for patient to return my call regarding emailed resources for assistance with paying electricity and spectrum  Outreach Attempt:  3rd Attempt.  Referral closed unable to contact patient.  A HIPAA compliant voice message was left requesting a return call.  Instructed patient to call back at [phone].   , AAS Paralegal, [place] . Embedded Care Coordination [facility]  Care Management  [address], [place] [address] ??[email or web address]  ?? [phone]   [email or web address]

## 2021-03-16 ENCOUNTER — Other Ambulatory Visit (HOSPITAL_COMMUNITY): Payer: Self-pay

## 2021-03-16 MED FILL — Latanoprost Ophth Soln 0.005%: OPHTHALMIC | 25 days supply | Qty: 2.5 | Fill #0 | Status: AC

## 2021-03-16 MED FILL — Clonazepam Tab 0.5 MG: ORAL | 30 days supply | Qty: 30 | Fill #0 | Status: AC

## 2021-04-01 ENCOUNTER — Other Ambulatory Visit (HOSPITAL_COMMUNITY): Payer: Self-pay

## 2021-04-01 MED FILL — Metformin HCl Tab 500 MG: ORAL | 30 days supply | Qty: 30 | Fill #0 | Status: AC

## 2021-04-05 ENCOUNTER — Other Ambulatory Visit (HOSPITAL_COMMUNITY): Payer: Self-pay

## 2021-04-05 MED FILL — Latanoprost Ophth Soln 0.005%: OPHTHALMIC | 25 days supply | Qty: 2.5 | Fill #1 | Status: AC

## 2021-04-08 ENCOUNTER — Telehealth: Payer: Self-pay

## 2021-04-08 NOTE — Chronic Care Management (AMB) (Signed)
Chronic Care Management Pharmacy Assistant   Name: Allison Franco  MRN: [id number] DOB: 20-Jul-1941   Reason for Encounter: Disease State/ Hypertension, Diabetes    Recent office visits:  01-04-2021 Daneen Schick (CCM)  01-20-2021 [name], [place]. Dose change Acetaminophen 1,368m every morning (patient reported not taking)  to 5077mevery 6 hours as needed.  02-23-2021 Little, AnClaudette StaplerRN (CCM)  02-24-2021 AlKellie SimmeringLPN (PCP)   Recent consult visits:  None  Hospital visits:  None in previous 6 months  Medications: Outpatient Encounter Medications as of 04/08/2021  Medication Sig  . acetaminophen (TYLENOL) 500 MG tablet Take 1 tablet (500 mg total) by mouth every 6 (six) hours as needed for mild pain, moderate pain or headache. Reported on 05/10/2016  . Alcohol Swabs (ALCOHOL WIPES) 70 % PADS by Does not apply route. Use with blood sugar check and injection of insulin  . blood glucose meter kit and supplies KIT Dispense based on patient and insurance preference. Use up to four times daily as directed. (FOR ICD-9 250.00, 250.01).  . Cholecalciferol (VITAMIN D3) 25 MCG (1000 UT) CAPS Take 1 capsule by mouth daily.  . clonazePAM (KLONOPIN) 0.5 MG tablet Take 0.5 mg by mouth at bedtime.   . clonazePAM (KLONOPIN) 0.5 MG tablet TAKE 1 TABLET BY MOUTH AT BEDTIME  . clonazePAM (KLONOPIN) 0.5 MG tablet TAKE 1 TABLET BY MOUTH EVERY NIGHT AT BEDTIME  . cloNIDine (CATAPRES) 0.1 MG tablet Take 0.1 mg by mouth. Take one tablet by mouth when provider instructs you to take  . COVID-19 mRNA vaccine, Pfizer, 30 MCG/0.3ML injection USE AS DIRECTED  . diclofenac Sodium (VOLTAREN) 1 % GEL Apply 2 g topically 4 (four) times daily. (Patient not taking: Reported on 02/24/2021)  . diclofenac Sodium (VOLTAREN) 1 % GEL APPLY 2 G TOPICALLY 4 (FOUR) TIMES DAILY. (Patient not taking: Reported on 02/24/2021)  . glucose blood test strip Use as instructed to check blood sugars 3 times a day. Dx code:  e11.65  . glucose blood test strip USE AS DIRECTED  . latanoprost (XALATAN) 0.005 % ophthalmic solution Place 1 drop into both eyes at bedtime.  . Marland Kitchenatanoprost (XALATAN) 0.005 % ophthalmic solution PLACE 1 DROP EACH EYE ONCE A DAY  . latanoprost (XALATAN) 0.005 % ophthalmic solution INSTILL 1 DROP INTO BOTH EYES ONCE A DAY  . metFORMIN (GLUCOPHAGE) 500 MG tablet Take 1 tablet (500 mg total) by mouth daily after lunch.  . metFORMIN (GLUCOPHAGE) 500 MG tablet TAKE 1 TABLET BY MOUTH WITH A MEAL ONCE A DAY 30 DAY(S)  . Multiple Vitamins-Minerals (CENTRUM SILVER 50+WOMEN) TABS Take 1 tablet by mouth daily.  . Marland Kitchenlmesartan (BENICAR) 20 MG tablet TAKE 1 TABLET (20 MG TOTAL) BY MOUTH DAILY.  . Tdap (BOOSTRIX) 5-2.5-18.5 LF-MCG/0.5 injection INJECT 0.5 MLS INTO THE MUSCLE ONCE FOR 1 DOSE.   No facility-administered encounter medications on file as of 04/08/2021.   Reviewed chart prior to disease state call. Spoke with patient regarding BP  Recent Office Vitals: BP Readings from Last 3 Encounters:  01/20/21 132/80  11/11/20 116/80  05/18/20 136/86   Pulse Readings from Last 3 Encounters:  01/20/21 74  11/11/20 87  05/18/20 94    Wt Readings from Last 3 Encounters:  02/24/21 180 lb (81.6 kg)  01/20/21 183 lb 3.2 oz (83.1 kg)  11/11/20 179 lb (81.2 kg)     Kidney Function Lab Results  Component Value Date/Time   CREATININE 0.83 11/11/2020 12:32 PM   CREATININE 0.86  05/18/2020 11:31 AM   GFRNONAA 67 11/11/2020 12:32 PM   GFRAA 78 11/11/2020 12:32 PM    BMP Latest Ref Rng & Units 11/11/2020 05/18/2020 04/05/2020  Glucose 65 - 99 mg/dL 103(H) 131(H) 138(H)  BUN 8 - 27 mg/dL _0 Creatinine 0.57 - 1.00 mg/dL 0.83 0.86 0.76  BUN/Creat Ratio 12 - _1 -  Sodium 134 - 144 mmol/L 139 139 137  Potassium 3.5 - 5.2 mmol/L 4.6 4.5 4.6  Chloride 96 - 106 mmol/L 102 100 101  CO2 20 - 29 mmol/L _2 Calcium 8.7 - 10.3 mg/dL 8.9 9.1 8.7(L)    . Current antihypertensive regimen:   ? Olmesartan 24m daily  . How often are you checking your Blood Pressure? daily   . Current home BP readings: 166/97  . What recent interventions/DTPs have been made by any provider to improve Blood Pressure control since last CPP Visit: Patient states she is doing better managing her salt intake and checks BP daily.  . Any recent hospitalizations or ED visits since last visit with CPP? No   . What diet changes have been made to improve Blood Pressure Control?  o Patient states she has limited her salt intake.  . What exercise is being done to improve your Blood Pressure Control?  o Patient states she walks daily back and forth to the grocery store.  Adherence Review: Is the patient currently on ACE/ARB medication? Yes Does the patient have >5 day gap between last estimated fill dates? No   Recent Relevant Labs: Lab Results  Component Value Date/Time   HGBA1C 5.9 (H) 11/11/2020 12:32 PM   HGBA1C 6.3 (H) 05/18/2020 11:31 AM   MICROALBUR 10 01/20/2021 12:36 PM    Kidney Function Lab Results  Component Value Date/Time   CREATININE 0.83 11/11/2020 12:32 PM   CREATININE 0.86 05/18/2020 11:31 AM   GFRNONAA 67 11/11/2020 12:32 PM   GFRAA 78 11/11/2020 12:32 PM    . Current antihyperglycemic regimen:  ? Metformin 5075monce daily after lunch  . What recent interventions/DTPs have been made to improve glycemic control:  o Patient states she limits her sugar intake  . Have there been any recent hospitalizations or ED visits since last visit with CPP? No   . Patient denies hypoglycemic symptoms  . Patient denies hyperglycemic symptoms  . How often are you checking your blood sugar? once daily . What are your blood sugars ranging?  o Fasting: 05-05 117, 05-06 116, 05-07 111, 05-08 113, 05-09 120, 05-10 110, 05-11 116 o Before meals: None o After meals: None o Bedtime: None  . During the week, how often does your blood glucose drop below 70? Never   . Are you checking  your feet daily/regularly? Patient states daily  Adherence Review: Is the patient currently on a STATIN medication? No Is the patient currently on ACE/ARB medication? Yes Does the patient have >5 day gap between last estimated fill dates? No  NOTES: 04-08-2021: Spoke with pt briefly. Pt states she will call back after her zoom appt. Patient states when she is at an appointment her BP is good but when she checks it at home it is elevated. Asked patient how is she positioned while checking BP and she stated she is sitting on the side of her bed with feet off of the floor. Recommended patient to sit in a chair with feet flat on the floor and to document BP's. Sent JaMinette Brinend VaCoralyn Helling  [facility] a message about patient's BP. Sent scheduling a message to schedule patient on 05-19-2021 at 1:00.  Star Rating Drugs: Olmesartan 12m- Last filled 02-15-2021 [facility] Metformin 5053m Last filled 04-05-2021 [facility]   [phone]

## 2021-04-19 ENCOUNTER — Other Ambulatory Visit (HOSPITAL_COMMUNITY): Payer: Self-pay

## 2021-04-19 ENCOUNTER — Encounter: Payer: Self-pay | Admitting: Nurse Practitioner

## 2021-04-19 ENCOUNTER — Ambulatory Visit (INDEPENDENT_AMBULATORY_CARE_PROVIDER_SITE_OTHER): Payer: Medicare Other | Admitting: Nurse Practitioner

## 2021-04-19 ENCOUNTER — Other Ambulatory Visit: Payer: Self-pay

## 2021-04-19 VITALS — BP 118/78 | HR 86 | Temp 98.3°F | Ht 63.0 in | Wt 183.8 lb

## 2021-04-19 DIAGNOSIS — I700 Atherosclerosis of aorta: Secondary | ICD-10-CM | POA: Diagnosis not present

## 2021-04-19 DIAGNOSIS — M25511 Pain in right shoulder: Secondary | ICD-10-CM | POA: Diagnosis not present

## 2021-04-19 DIAGNOSIS — H9313 Tinnitus, bilateral: Secondary | ICD-10-CM

## 2021-04-19 DIAGNOSIS — I10 Essential (primary) hypertension: Secondary | ICD-10-CM | POA: Diagnosis not present

## 2021-04-19 DIAGNOSIS — E119 Type 2 diabetes mellitus without complications: Secondary | ICD-10-CM

## 2021-04-19 DIAGNOSIS — Z6832 Body mass index (BMI) 32.0-32.9, adult: Secondary | ICD-10-CM

## 2021-04-19 DIAGNOSIS — M79644 Pain in right finger(s): Secondary | ICD-10-CM

## 2021-04-19 DIAGNOSIS — E6609 Other obesity due to excess calories: Secondary | ICD-10-CM

## 2021-04-19 MED ORDER — CLONIDINE HCL 0.1 MG PO TABS
ORAL_TABLET | ORAL | 1 refills | Status: DC
  Filled 2021-04-19: qty 30, 30d supply, fill #0
  Filled 2021-05-27: qty 30, 30d supply, fill #1

## 2021-04-19 MED ORDER — CLONAZEPAM 0.5 MG PO TABS
0.5000 mg | ORAL_TABLET | Freq: Every day | ORAL | 2 refills | Status: DC | PRN
  Filled 2021-04-19: qty 30, 30d supply, fill #0
  Filled 2021-05-18: qty 30, 30d supply, fill #1
  Filled 2021-06-22: qty 30, 30d supply, fill #0
  Filled 2021-06-22: qty 30, 30d supply, fill #2

## 2021-04-19 NOTE — Progress Notes (Signed)
I,[name] as a Education administrator for [employer], FNP.,have documented all relevant documentation on the behalf of [name], FNP,as directed by  [name], FNP while in the presence of [name], [place]. This visit occurred during the SARS-CoV-2 public health emergency.  Safety protocols were in place, including screening questions prior to the visit, additional usage of staff PPE, and extensive cleaning of exam room while observing appropriate contact time as indicated for disinfecting solutions.  Subjective:     Patient ID: Allison Franco , female    DOB: [birth year] , 80 y.o.   MRN: [id number]   Chief Complaint  Patient presents with   Diabetes   Hypertension    HPI  Patient presents today for a diabetes and blood pressure f/u  Wt Readings from Last 3 Encounters: 04/19/21 : 183 lb 12.8 oz (83.4 kg) 02/24/21 : 180 lb (81.6 kg) 01/20/21 : 183 lb 3.2 oz (83.1 kg)  Diabetes She presents for her follow-up diabetic visit. She has type 2 diabetes mellitus. Pertinent negatives for hypoglycemia include no dizziness or headaches. There are no diabetic associated symptoms. Pertinent negatives for diabetes include no chest pain, no polydipsia, no polyphagia and no polyuria. There are no hypoglycemic complications. Symptoms are stable. There are no diabetic complications. Risk factors for coronary artery disease include sedentary lifestyle and obesity. When asked about current treatments, none were reported. She is following a generally healthy diet. When asked about meal planning, she reported none. She has not had a previous visit with a dietitian. She rarely participates in exercise. (Her last blood sugar was 94.  ) An ACE inhibitor/angiotensin II receptor blocker is being taken. She does not see a podiatrist.Eye exam is not current.  Hypertension This is a chronic problem. The current episode started more than 1 year ago. The problem is controlled. Pertinent negatives include no  chest pain, headaches or palpitations. There are no associated agents to hypertension. Past treatments include angiotensin blockers. There are no compliance problems.  There is no history of angina. There is no history of chronic renal disease.    Past Medical History:  Diagnosis Date   Anxiety    Constipation    Depression    "following husband's death"   Diabetes mellitus without complication ([place])    Hypertension    KNEE PAIN, BILATERAL 02/13/2009   Qualifier: Diagnosis of  By: Hassell Done FNP, Tori Milks     Tinnitus      Family History  Problem Relation Age of Onset   Diabetes Mother    Hypertension Father      Current Outpatient Medications:    acetaminophen (TYLENOL) 500 MG tablet, Take 1 tablet (500 mg total) by mouth every 6 (six) hours as needed for mild pain, moderate pain or headache. Reported on 05/10/2016, Disp: 30 tablet, Rfl: 0   Alcohol Swabs (ALCOHOL WIPES) 70 % PADS, by Does not apply route. Use with blood sugar check and injection of insulin, Disp: , Rfl:    blood glucose meter kit and supplies KIT, Dispense based on patient and insurance preference. Use up to four times daily as directed. (FOR ICD-9 250.00, 250.01)., Disp: 1 each, Rfl: 0   Cholecalciferol (VITAMIN D3) 25 MCG (1000 UT) CAPS, Take 1 capsule by mouth daily., Disp: , Rfl:    COVID-19 mRNA vaccine, Pfizer, 30 MCG/0.3ML injection, USE AS DIRECTED, Disp: .3 mL, Rfl: 0   glucose blood test strip, USE AS DIRECTED, Disp: 50 strip, Rfl: 11   latanoprost (XALATAN) 0.005 %  ophthalmic solution, PLACE 1 DROP EACH EYE ONCE A DAY, Disp: 2.5 mL, Rfl: 5   metFORMIN (GLUCOPHAGE) 500 MG tablet, TAKE 1 TABLET BY MOUTH WITH A MEAL ONCE A DAY 30 DAY(S), Disp: 30 tablet, Rfl: 5   Multiple Vitamins-Minerals (CENTRUM SILVER 50+WOMEN) TABS, Take 1 tablet by mouth daily., Disp: , Rfl:    olmesartan (BENICAR) 20 MG tablet, TAKE 1 TABLET (20 MG TOTAL) BY MOUTH DAILY., Disp: 90 tablet, Rfl: 1   Tdap (BOOSTRIX) 5-2.5-18.5 LF-MCG/0.5  injection, INJECT 0.5 MLS INTO THE MUSCLE ONCE FOR 1 DOSE., Disp: .5 mL, Rfl: 0   clonazePAM (KLONOPIN) 0.5 MG tablet, Take 1 tablet (0.5 mg total) by mouth daily as needed for anxiety., Disp: 30 tablet, Rfl: 2   cloNIDine (CATAPRES) 0.1 MG tablet, Take 1 tablet by mouth daily if systolic blood pressure over 160. If remains elevated call to office, Disp: 30 tablet, Rfl: 1   diclofenac Sodium (VOLTAREN) 1 % GEL, Apply 2 g topically 4 (four) times daily. (Patient not taking: No sig reported), Disp: 100 g, Rfl: 2   diclofenac Sodium (VOLTAREN) 1 % GEL, APPLY 2 G TOPICALLY 4 (FOUR) TIMES DAILY. (Patient not taking: No sig reported), Disp: 100 g, Rfl: 2   Allergies  Allergen Reactions   Aspirin Other (See Comments)    insomnia   Hydroxyzine Other (See Comments)    insomnia   Valium [Diazepam] Other (See Comments)    Insomnia, 05/10/16 pt states she took as young person- made her depressed     Review of Systems  Constitutional: Negative.   Respiratory: Negative.  Negative for wheezing.   Cardiovascular: Negative.  Negative for chest pain, palpitations and leg swelling.  Gastrointestinal: Negative.   Endocrine: Negative for polydipsia, polyphagia and polyuria.  Musculoskeletal:  Positive for arthralgias (right index finger, right shoulder and left knee pain worse at night).  Neurological:  Negative for dizziness and headaches.  Psychiatric/Behavioral: Negative.      Today's Vitals   04/19/21 0921  BP: 118/78  Pulse: 86  Temp: 98.3 F (36.8 C)  Weight: 183 lb 12.8 oz (83.4 kg)  Height: _0  (1.6 m)  PainSc: 5    Body mass index is 32.56 kg/m.   Objective:  Physical Exam Vitals reviewed.  Constitutional:      General: She is not in acute distress.    Appearance: Normal appearance.  Cardiovascular:     Rate and Rhythm: Normal rate and regular rhythm.     Pulses: Normal pulses.     Heart sounds: Normal heart sounds. No murmur heard. Pulmonary:     Effort: Pulmonary effort is  normal. No respiratory distress.     Breath sounds: Normal breath sounds.  Musculoskeletal:        General: No swelling, tenderness (mild crepitus noted to lateral knee) or deformity. Normal range of motion.  Skin:    General: Skin is warm and dry.     Capillary Refill: Capillary refill takes less than 2 seconds.  Neurological:     General: No focal deficit present.     Mental Status: She is alert and oriented to person, place, and time.     Cranial Nerves: No cranial nerve deficit.     Motor: No weakness.  Psychiatric:        Mood and Affect: Mood normal. Mood is not anxious.        Behavior: Behavior normal.        Thought Content: Thought content normal.  Judgment: Judgment normal.        Assessment And Plan:     1. Type 2 diabetes mellitus without complication, without long-term current use of insulin (HCC) Chronic, stable  Continue with current medications - Lipid panel - Hemoglobin A1c  2. Essential hypertension Chronic, occasionally will have to take clonidine if systolic greater than 194 - CMP14+EGFR - cloNIDine (CATAPRES) 0.1 MG tablet; Take 1 tablet by mouth daily if systolic blood pressure over 160. If remains elevated call to office  Dispense: 30 tablet; Refill: 1  3. Acute pain of right shoulder She has good range of motion   4. Aortic atherosclerosis (HCC) Chronic, continue with statin  5. Tinnitus of both ears Continue with clonazepam as needed - clonazePAM (KLONOPIN) 0.5 MG tablet; Take 1 tablet (0.5 mg total) by mouth daily as needed for anxiety.  Dispense: 30 tablet; Refill: 2  6. Finger pain, right Mild swelling noted, will check for uric acid - Uric acid  7. Class 1 obesity due to excess calories with body mass index (BMI) of 32.0 to 32.9 in adult, unspecified whether serious comorbidity present Chronic Discussed healthy diet and regular exercise options  Encouraged to exercise at least 150 minutes per week with 2 days of strength  training   Patient was given opportunity to ask questions. Patient verbalized understanding of the plan and was able to repeat key elements of the plan. All questions were answered to their satisfaction.  [name], FNP   I, [name], FNP, have reviewed all documentation for this visit. The documentation on 04/19/21 for the exam, diagnosis, procedures, and orders are all accurate and complete.   IF YOU HAVE BEEN REFERRED TO A SPECIALIST, IT MAY TAKE 1-2 WEEKS TO SCHEDULE/PROCESS THE REFERRAL. IF YOU HAVE NOT HEARD FROM US/SPECIALIST IN TWO WEEKS, PLEASE GIVE [place] A CALL AT [phone].   THE PATIENT IS ENCOURAGED TO PRACTICE SOCIAL DISTANCING DUE TO THE COVID-19 PANDEMIC.

## 2021-04-20 LAB — CMP14+EGFR
ALT: 20 IU/L (ref 0–32)
AST: 20 IU/L (ref 0–40)
Albumin/Globulin Ratio: 1.6 (ref 1.2–2.2)
Albumin: 4.4 g/dL (ref 3.7–4.7)
Alkaline Phosphatase: 109 IU/L (ref 44–121)
BUN/Creatinine Ratio: 20 (ref 12–28)
BUN: 17 mg/dL (ref 8–27)
Bilirubin Total: 0.4 mg/dL (ref 0.0–1.2)
CO2: 25 mmol/L (ref 20–29)
Calcium: 9.1 mg/dL (ref 8.7–10.3)
Chloride: 99 mmol/L (ref 96–106)
Creatinine, Ser: 0.86 mg/dL (ref 0.57–1.00)
Globulin, Total: 2.8 g/dL (ref 1.5–4.5)
Glucose: 123 mg/dL — ABNORMAL HIGH (ref 65–99)
Potassium: 4.8 mmol/L (ref 3.5–5.2)
Sodium: 137 mmol/L (ref 134–144)
Total Protein: 7.2 g/dL (ref 6.0–8.5)
eGFR: 69 mL/min/{1.73_m2} (ref >59)

## 2021-04-20 LAB — HEMOGLOBIN A1C
Est. average glucose Bld gHb Est-mCnc: 120 mg/dL
Hgb A1c MFr Bld: 5.8 % — ABNORMAL HIGH (ref 4.8–5.6)

## 2021-04-20 LAB — LIPID PANEL
Chol/HDL Ratio: 4 ratio (ref 0.0–4.4)
Cholesterol, Total: 166 mg/dL (ref 100–199)
HDL: 42 mg/dL (ref >39)
LDL Chol Calc (NIH): 88 mg/dL (ref 0–99)
Triglycerides: 210 mg/dL — ABNORMAL HIGH (ref 0–149)
VLDL Cholesterol Cal: 36 mg/dL (ref 5–40)

## 2021-04-20 LAB — URIC ACID: Uric Acid: 7.7 mg/dL (ref 3.1–7.9)

## 2021-04-22 ENCOUNTER — Ambulatory Visit (INDEPENDENT_AMBULATORY_CARE_PROVIDER_SITE_OTHER): Payer: Medicare Other

## 2021-04-22 ENCOUNTER — Telehealth: Payer: Medicare Other

## 2021-04-22 DIAGNOSIS — I10 Essential (primary) hypertension: Secondary | ICD-10-CM | POA: Diagnosis not present

## 2021-04-22 DIAGNOSIS — E119 Type 2 diabetes mellitus without complications: Secondary | ICD-10-CM | POA: Diagnosis not present

## 2021-04-22 DIAGNOSIS — G8929 Other chronic pain: Secondary | ICD-10-CM

## 2021-04-22 DIAGNOSIS — M25561 Pain in right knee: Secondary | ICD-10-CM

## 2021-04-23 NOTE — Patient Instructions (Signed)
Goals Addressed    . Cope with Pain-Osteoarthritis   On track    Timeframe:  Long-Range Goal Priority:  High Start Date:  11/10/20                          Expected End Date: 11/10/21               Follow Up Date: 08/25/21   - use distraction techniques - use relaxation during pain    Why is this important?    Living with joint pain and enjoying your life may be hard.   Feelings like depression or anger can make your pain worse.   Learning ways to cope may help you find some relief from the pain.    Notes:     . COMPLETED: Make and Keep All Appointments       Timeframe:  Long-Range Goal Priority:  High Start Date: 11/10/20                        Expected End Date: 05/11/21                     Follow Up Date: 04/22/21   - arrange a ride through an agency 1 week before appointment - keep a calendar with appointment dates    Why is this important?    Part of staying healthy is seeing the doctor for follow-up care.   If you forget your appointments, there are some things you can do to stay on track.    Notes:     . COMPLETED: [facility] My Quality of Life       Timeframe:  Long-Range Goal Priority:  High Start Date: 11/10/20                            Expected End Date: 05/11/21                      Follow Up Date: 04/22/21    - keep all MD appointments when scheduled - take all medications exactly as prescribed - call MD office to report concerns or change in condition  - discuss my treatment options with the doctor or nurse - make shared treatment decisions with doctor - stay active and get plenty of sleep    Why is this important?    Having a long-term illness can be scary.   It can also be stressful for you and your caregiver.   These steps may help.    Notes:     [name] Monitor and Manage My Blood Sugar-Diabetes Type 2   On track    Timeframe:  Long-Range Goal Priority:  High Start Date: 11/10/20                            Expected End Date: 11/10/21                      Follow Up Date: 08/25/21   - Self administers oral medications as prescribed - Attends all scheduled provider appointments - Checks blood sugars as prescribed and utilize hyper and hypoglycemia protocol as needed - Adheres to prescribed ADA/carb modified   Why is this important?    Checking your blood sugar at home helps to keep it from getting very high or very  low.   Writing the results in a diary or log helps the doctor know how to care for you.   Your blood sugar log should have the time, date and the results.   Also, write down the amount of insulin or other medicine that you take.   Other information, like what you ate, exercise done and how you were feeling, will also be helpful.     Notes:     . Track and Manage My Blood Pressure-Hypertension   On track    Timeframe:  Long-Range Goal Priority:  High Start Date: 11/10/20                            Expected End Date: 11/10/21                    Follow Up Date: 08/25/21   - check blood pressure 3 times per week - write blood pressure results in a log or diary  - Self administers medications as prescribed - Attends all scheduled provider appointments - Calls provider office for new concerns, questions, or BP outside discussed parameters - Follows a low sodium diet/DASH diet   Why is this important?    You won't feel high blood pressure, but it can still hurt your blood vessels.   High blood pressure can cause heart or kidney problems. It can also cause a stroke.   Making lifestyle changes like losing a  weight or eating less salt will help.   Checking your blood pressure at home and at different times of the day can help to control blood pressure.   If the doctor prescribes medicine remember to take it the way the doctor ordered.   Call the office if you cannot afford the medicine or if there are questions about it.     Notes:

## 2021-04-23 NOTE — Chronic Care Management (AMB) (Signed)
Chronic Care Management   CCM RN Visit Note  04/23/2021 Name: Allison Franco MRN: [id number] DOB: 01-07-1941  Subjective: Allison Franco is a 80 y.o. year old female who is a primary care patient of [name], [place]. The care management team was consulted for assistance with disease management and care coordination needs.    Engaged with patient by telephone for follow up visit in response to provider referral for case management and/or care coordination services.   Consent to Services:  The patient was given information about Chronic Care Management services, agreed to services, and gave verbal consent prior to initiation of services.  Please see initial visit note for detailed documentation.   Patient agreed to services and verbal consent obtained.   Assessment: Review of patient past medical history, allergies, medications, health status, including review of consultants reports, laboratory and other test data, was performed as part of comprehensive evaluation and provision of chronic care management services.   SDOH (Social Determinants of Health) assessments and interventions performed:  Yes, no acute challenges   CCM Care Plan  Allergies  Allergen Reactions  . Aspirin Other (See Comments)    insomnia  . Hydroxyzine Other (See Comments)    insomnia  . Valium [Diazepam] Other (See Comments)    Insomnia, 05/10/16 pt states she took as young person- made her depressed    Outpatient Encounter Medications as of 04/22/2021  Medication Sig  . acetaminophen (TYLENOL) 500 MG tablet Take 1 tablet (500 mg total) by mouth every 6 (six) hours as needed for mild pain, moderate pain or headache. Reported on 05/10/2016  . Alcohol Swabs (ALCOHOL WIPES) 70 % PADS by Does not apply route. Use with blood sugar check and injection of insulin  . blood glucose meter kit and supplies KIT Dispense based on patient and insurance preference. Use up to four times daily as directed. (FOR ICD-9 250.00, 250.01).   . Cholecalciferol (VITAMIN D3) 25 MCG (1000 UT) CAPS Take 1 capsule by mouth daily.  . clonazePAM (KLONOPIN) 0.5 MG tablet Take 1 tablet (0.5 mg total) by mouth daily as needed for anxiety.  . cloNIDine (CATAPRES) 0.1 MG tablet Take 1 tablet by mouth daily if systolic blood pressure over 160. If remains elevated call to office  . COVID-19 mRNA vaccine, Pfizer, 30 MCG/0.3ML injection USE AS DIRECTED  . diclofenac Sodium (VOLTAREN) 1 % GEL Apply 2 g topically 4 (four) times daily. (Patient not taking: No sig reported)  . diclofenac Sodium (VOLTAREN) 1 % GEL APPLY 2 G TOPICALLY 4 (FOUR) TIMES DAILY. (Patient not taking: No sig reported)  . glucose blood test strip USE AS DIRECTED  . latanoprost (XALATAN) 0.005 % ophthalmic solution PLACE 1 DROP EACH EYE ONCE A DAY  . metFORMIN (GLUCOPHAGE) 500 MG tablet TAKE 1 TABLET BY MOUTH WITH A MEAL ONCE A DAY 30 DAY(S)  . Multiple Vitamins-Minerals (CENTRUM SILVER 50+WOMEN) TABS Take 1 tablet by mouth daily.  [name] olmesartan (BENICAR) 20 MG tablet TAKE 1 TABLET (20 MG TOTAL) BY MOUTH DAILY.  . Tdap (BOOSTRIX) 5-2.5-18.5 LF-MCG/0.5 injection INJECT 0.5 MLS INTO THE MUSCLE ONCE FOR 1 DOSE.   No facility-administered encounter medications on file as of 04/22/2021.    Patient Active Problem List   Diagnosis Date Noted  . Primary open angle glaucoma (POAG) of both eyes, severe stage 07/16/2020  . Inguinal hernia with bowel obstruction 08/07/2019  . Incarcerated left inguinal hernia 08/07/2019  . Anemia 08/31/2018  . Essential hypertension 07/03/2018  . Type 2 diabetes mellitus  without complication, without long-term current use of insulin ([place]) 07/02/2018  . Grief reaction 10/20/2013  . Obesity 10/20/2013  . ALLERGIC RHINITIS 08/11/2009  . INSOMNIA 07/17/2008  . TIREDNESS 04/16/2008  . 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   Knowledge Deficits related to short term plan for care coordination needs and long term plans for chronic disease management needs Nurse Case Manager Clinical Goal(s):   Over the next 180 days, patient will work with care management team to address care coordination and chronic disease management needs related to Disease Management  Educational Needs  Care Coordination  Medication Management and Education  Psychosocial Support   Interventions:  04/22/21 completed successful outbound call with patient  . Assessed for sensory issues that impact quality of life such as hearing loss, vision deficit; strategize ways to maintain or improve hearing, vision.  . Determined patient is now wearing her hearing aids but continues to suffer from "ringing" in her ears that is intermittent and bothersome . Determined patient plans to discuss this condition further with PCP provider [name] FNP during her next scheduled visit set for 04/19/21 _0 :15 AM . Promoted activities to decrease social isolation such as group support or social, leisure and recreational activities, employment, use of social media; consider safety concerns about being out of home for activities.  . Promoted access to services in the community to support independence such as support groups, home visiting programs, financial assistance, handicapped parking tags, durable medical equipment and emergency responder.  . Discussed plans with patient  for ongoing care management follow up and provided patient with direct contact information for care management team Patient Goals/Self Care Activities:  - keep all MD appointments when scheduled - take all medications exactly as prescribed - call MD office to report concerns or change in condition  - discuss my treatment options with the doctor or nurse - make shared treatment decisions with doctor - stay active and get plenty of sleep  - arrange a ride through an agency 1 week before appointment - keep a calendar with appointment dates  Follow Up Plan: Follow up with provider as directed    Care Plan : Diabetes Type 2 (Adult)  Updates made by Lynne Logan, RN since 04/23/2021 12:00 AM    Problem: Disease Progression (Diabetes, Type 2)   Priority: High    Long-Range Goal: Disease Progression Prevented or Minimized   Start Date: 11/10/2020  Expected End Date: 11/10/2021  Recent Progress: On track  Priority: High  Note:   Objective:  Lab Results  Component Value Date   HGBA1C 5.8 (H) 04/19/2021 .   Lab Results  Component Value Date   CREATININE 0.86 04/19/2021   CREATININE 0.83 11/11/2020   CREATININE 0.86 05/18/2020 .   Lab Results  Component Value Date   EGFR 69 04/19/2021 .   [name] No results found for: EGFR Current Barriers:  .  Knowledge Deficits related to basic Diabetes pathophysiology and self care/management . Knowledge Deficits related to medications used for management of diabetes . Limited Social Support . Lacks social connections Case Manager Clinical Goal(s):  [name] Patient will demonstrate improved adherence to prescribed treatment plan for diabetes self care/management as evidenced by:  . adherence to ADA/ carb modified diet . adherence to prescribed medication regimen Interventions:  04/22/21 completed successful call with patient . Provided education to patient about basic DM disease process . Educated on dietary and exercise recommendations; daily  glycemic control, FBS 80-130, <180 after meals; 15'15' rule . Review of patient status, including review of consultants reports, relevant laboratory and other test results, and medications completed. . Reviewed medications with patient and discussed importance of medication adherence . Educated patient on dietary and exercise recommendations  . Advised patient, providing education and rationale, to check cbg daily before meals calling the CCM team and or PCP for findings outside established parameters.   . Discussed plans with patient for ongoing care management follow up and provided patient with direct contact information for care management team Patient Self-Care Activities Self administers oral medications as prescribed Attends all scheduled provider appointments Checks blood sugars as prescribed and utilize hyper and hypoglycemia protocol as needed Adheres to prescribed ADA/carb modified  Patient Goals:  - maintain/lower A1c   Follow Up Plan: Telephone follow up appointment with care management team member scheduled for: 08/25/21  Care Plan : Hypertension (Adult)  Updates made by Lynne Logan, RN since 04/23/2021 12:00 AM    Problem: Disease Progression (Hypertension)   Priority: High    Long-Range Goal: Disease Progression Prevented or Minimized   Start Date: 11/10/2020  Expected End Date: 11/10/2021  Recent Progress: On track  Priority: High  Note:   Objective:  . Last practice recorded BP readings:  BP Readings from Last 3 Encounters:  04/19/21 118/78  01/20/21 132/80  11/11/20 116/80 .   [name] Most recent eGFR/CrCl:  Lab Results  Component Value Date   EGFR 69 04/19/2021 .    No components found for: CRCL Current Barriers:  [name] Knowledge Deficits related to basic understanding of hypertension pathophysiology and self care management . Knowledge Deficits related to understanding of medications prescribed for management of hypertension . Difficulty obtaining  medications . Limited Social Support . Lacks social connections Case Manager Clinical Goal(s):  [name] Patient will verbalize basic understanding of hypertension disease process and self health management plan as evidenced by patient will feel more comfortable Self Checking her BP at home at least 3 times daily and recording her readings; she will take all prescribed BP medications exactly as prescribed  Interventions:  04/22/21 completed successful outbound call with patient  . Evaluation of current treatment plan related to hypertension self management and patient's adherence to plan as established by provider. . Provided education to patient re: stroke prevention, s/s of heart attack and stroke, DASH diet, complications of uncontrolled blood pressure . Reviewed medications with patient and discussed importance of compliance . Assessed for and promoted awareness of worsening disease or development of comorbidity.  . Educated on dietary and exercise recommendations . Discussed plans with patient for ongoing care management follow up and provided patient with direct contact information for care management team Patient Goals/Self-Care Activities  Self administers medications as prescribed Attends all scheduled provider appointments Calls provider office for new concerns, questions, or BP outside discussed parameters Follows a low sodium diet/DASH diet - check blood pressure 3 times per week -  write blood pressure results in a log or diary  Follow Up Plan: Telephone follow up appointment with care management team member scheduled for: 08/25/21    Care Plan : Osteoarthritis (Adult)  Updates made by Lynne Logan, RN since 04/23/2021 12:00 AM    Problem: Pain (Osteoarthritis)   Priority: High    Long-Range Goal: Manage Pain   Start Date: 11/10/2020  Expected End Date: 11/10/2021  Recent Progress: On track  Priority: High  Note:   Current Barriers:   Ineffective Self Health  Maintenance  Lacks social connections  Currently UNABLE TO independently self manage needs related to chronic health conditions.   Knowledge Deficits related to short term plan for care coordination needs and long term plans for chronic disease management needs Nurse Case Manager Clinical Goal(s):   Patient will work with care management team to address care coordination and chronic disease management needs related to Disease Management  Educational Needs  Care Coordination  Medication Management and Education  Psychosocial Support   Interventions:  04/22/21 completed successful outbound call with patient  . Provided education to patient about basic disease process related to chronic pain . Review of patient status, including review of consultants reports, relevant laboratory and other test results, and medications completed. . Reviewed medications with patient and discussed importance of medication adherence . Assessed pain level, treatment efficacy and patient response at regular intervals using a consistent pain scale; review the effectiveness and tolerability of all treatments. . Encouraged nonpharmacologic measures, such as applied heat or cold, exercise, physical or occupational therapy, orthoses, cognitive behavioral therapy, yoga, tai-chi, acupuncture, adequate sleep and weight loss.  Promoted individualized plan to stay active when unable to participate in physical therapy, such as passive and active range of motion, yoga, walking, water aerobics or swimming.   Discussed plans with patient for ongoing care management follow up and provided patient with direct contact information for care management team Patient Goals/Self Care Activities:  - use distraction techniques - use relaxation during pain  Follow Up Plan: Telephone follow up appointment with care management team member scheduled for: 08/25/21        Plan:Telephone follow up appointment with care management team  member scheduled for:  08/25/21  Barb Merino, RN, BSN, CCM Care Management Coordinator [facility]  Direct Phone: [phone]

## 2021-04-26 MED FILL — Diclofenac Sodium Gel 1% (1.16% Diethylamine Equiv): CUTANEOUS | 12 days supply | Qty: 100 | Fill #0 | Status: AC

## 2021-04-27 ENCOUNTER — Other Ambulatory Visit (HOSPITAL_COMMUNITY): Payer: Self-pay

## 2021-04-30 ENCOUNTER — Other Ambulatory Visit (HOSPITAL_COMMUNITY): Payer: Self-pay

## 2021-04-30 MED FILL — Metformin HCl Tab 500 MG: ORAL | 30 days supply | Qty: 30 | Fill #1 | Status: AC

## 2021-05-18 ENCOUNTER — Telehealth: Payer: Self-pay

## 2021-05-18 ENCOUNTER — Other Ambulatory Visit (HOSPITAL_COMMUNITY): Payer: Self-pay

## 2021-05-18 NOTE — Chronic Care Management (AMB) (Signed)
   No answer, left message of telephone appointment on 05-19-2021 at 1:00. Left message to have all medications, supplements, blood pressure and/or blood sugar logs available during appointment and to return call if need to reschedule.  [place] Pharmacist Assistant [phone]

## 2021-05-19 ENCOUNTER — Ambulatory Visit (INDEPENDENT_AMBULATORY_CARE_PROVIDER_SITE_OTHER): Payer: Medicare Other

## 2021-05-19 DIAGNOSIS — E119 Type 2 diabetes mellitus without complications: Secondary | ICD-10-CM | POA: Diagnosis not present

## 2021-05-19 DIAGNOSIS — I10 Essential (primary) hypertension: Secondary | ICD-10-CM | POA: Diagnosis not present

## 2021-05-23 ENCOUNTER — Encounter (HOSPITAL_COMMUNITY): Payer: Self-pay

## 2021-05-23 ENCOUNTER — Other Ambulatory Visit: Payer: Self-pay

## 2021-05-23 ENCOUNTER — Ambulatory Visit (HOSPITAL_COMMUNITY)
Admit: 2021-05-23 | Discharge: 2021-05-23 | Disposition: A | Discharge status: Home / Self Care | Payer: Medicare Other | Source: Ambulatory Visit | Attending: Urgent Care | Admitting: Urgent Care

## 2021-05-23 ENCOUNTER — Ambulatory Visit (INDEPENDENT_AMBULATORY_CARE_PROVIDER_SITE_OTHER): Payer: Medicare Other

## 2021-05-23 DIAGNOSIS — R0602 Shortness of breath: Secondary | ICD-10-CM

## 2021-05-23 DIAGNOSIS — R0789 Other chest pain: Secondary | ICD-10-CM

## 2021-05-23 DIAGNOSIS — Z20822 Contact with and (suspected) exposure to covid-19: Secondary | ICD-10-CM | POA: Insufficient documentation

## 2021-05-23 DIAGNOSIS — R079 Chest pain, unspecified: Secondary | ICD-10-CM

## 2021-05-23 DIAGNOSIS — Z886 Allergy status to analgesic agent status: Secondary | ICD-10-CM | POA: Insufficient documentation

## 2021-05-23 NOTE — ED Provider Notes (Signed)
[place]   MRN: [id number] DOB: 07-25-1941  Subjective:   Allison Franco is a 80 y.o. female presenting for 1 week history of persistent intermittent shortness of breath, chest pressure over the mid to left side.  Patient denies any active symptoms here in the clinic.  Would like to make sure Allison Franco does not have pneumonia or COVID-19.  Allison Franco does have her vaccinations.  Denies fever, body aches, runny or stuffy nose, sore throat, cough, abdominal pain.  Allison Franco is not a smoker.  No history of asthma or respiratory disorders.  No current facility-administered medications for this encounter.  Current Outpatient Medications:    acetaminophen (TYLENOL) 500 MG tablet, Take 1 tablet (500 mg total) by mouth every 6 (six) hours as needed for mild pain, moderate pain or headache. Reported on 05/10/2016, Disp: 30 tablet, Rfl: 0   Cholecalciferol (VITAMIN D3) 25 MCG (1000 UT) CAPS, Take 1 capsule by mouth daily., Disp: , Rfl:    clonazePAM (KLONOPIN) 0.5 MG tablet, Take 1 tablet (0.5 mg total) by mouth daily as needed for anxiety., Disp: 30 tablet, Rfl: 2   cloNIDine (CATAPRES) 0.1 MG tablet, Take 1 tablet by mouth daily if systolic blood pressure over 160. If remains elevated call to office, Disp: 30 tablet, Rfl: 1   metFORMIN (GLUCOPHAGE) 500 MG tablet, TAKE 1 TABLET BY MOUTH WITH A MEAL ONCE A DAY 30 DAY(S), Disp: 30 tablet, Rfl: 5   Multiple Vitamins-Minerals (CENTRUM SILVER 50+WOMEN) TABS, Take 1 tablet by mouth daily., Disp: , Rfl:    Alcohol Swabs (ALCOHOL WIPES) 70 % PADS, by Does not apply route. Use with blood sugar check and injection of insulin, Disp: , Rfl:    blood glucose meter kit and supplies KIT, Dispense based on patient and insurance preference. Use up to four times daily as directed. (FOR ICD-9 250.00, 250.01)., Disp: 1 each, Rfl: 0   COVID-19 mRNA vaccine, Pfizer, 30 MCG/0.3ML injection, USE AS DIRECTED, Disp: .3 mL, Rfl: 0   diclofenac Sodium (VOLTAREN) 1 % GEL, Apply 2 g  topically 4 (four) times daily. (Patient not taking: No sig reported), Disp: 100 g, Rfl: 2   diclofenac Sodium (VOLTAREN) 1 % GEL, APPLY 2 G TOPICALLY 4 (FOUR) TIMES DAILY. (Patient not taking: No sig reported), Disp: 100 g, Rfl: 2   glucose blood test strip, USE AS DIRECTED, Disp: 50 strip, Rfl: 11   latanoprost (XALATAN) 0.005 % ophthalmic solution, PLACE 1 DROP EACH EYE ONCE A DAY, Disp: 2.5 mL, Rfl: 5   olmesartan (BENICAR) 20 MG tablet, TAKE 1 TABLET (20 MG TOTAL) BY MOUTH DAILY., Disp: 90 tablet, Rfl: 1   Tdap (BOOSTRIX) 5-2.5-18.5 LF-MCG/0.5 injection, INJECT 0.5 MLS INTO THE MUSCLE ONCE FOR 1 DOSE., Disp: .5 mL, Rfl: 0   Allergies  Allergen Reactions   Aspirin Other (See Comments)    insomnia   Hydroxyzine Other (See Comments)    insomnia   Valium [Diazepam] Other (See Comments)    Insomnia, 05/10/16 pt states Allison Franco took as young person- made her depressed    Past Medical History:  Diagnosis Date   Anxiety    Constipation    Depression    "following husband's death"   Diabetes mellitus without complication ([place])    Hypertension    KNEE PAIN, BILATERAL 02/13/2009   Qualifier: Diagnosis of  By: Hassell Done FNP, Tori Milks     Tinnitus      Past Surgical History:  Procedure Laterality Date   ABDOMINAL HYSTERECTOMY  COLON RESECTION N/A 08/07/2019   Procedure: DIAGNOSTIC LAPAROSCOPY WITH OPEN LEFT INGUINAL HERNIA WITH MESH;  Surgeon: Johnathan Hausen, MD;  Location: [facility];  Service: General;  Laterality: N/A;   fribroid surgery      Family History  Problem Relation Age of Onset   Diabetes Mother    Hypertension Father     Social History   Tobacco Use   Smoking status: Never   Smokeless tobacco: Never  Vaping Use   Vaping Use: Never used  Substance Use Topics   Alcohol use: No   Drug use: No    ROS   Objective:   Vitals: BP (!) 158/73   Pulse 88   Temp 98.7 F (37.1 C) (Oral)   Resp 18   SpO2 99%   Physical Exam Constitutional:      General: Allison Franco is not  in acute distress.    Appearance: Normal appearance. Allison Franco is well-developed. Allison Franco is not ill-appearing, toxic-appearing or diaphoretic.  HENT:     Head: Normocephalic and atraumatic.     Nose: Nose normal.     Mouth/Throat:     Mouth: Mucous membranes are moist.  Eyes:     Extraocular Movements: Extraocular movements intact.     Pupils: Pupils are equal, round, and reactive to light.  Cardiovascular:     Rate and Rhythm: Normal rate and regular rhythm.     Pulses: Normal pulses.     Heart sounds: Normal heart sounds. No murmur heard.   No friction rub. No gallop.  Pulmonary:     Effort: Pulmonary effort is normal. No respiratory distress.     Breath sounds: Normal breath sounds. No stridor. No wheezing, rhonchi or rales.  Skin:    General: Skin is warm and dry.     Findings: No rash.  Neurological:     Mental Status: Allison Franco is alert and oriented to person, place, and time.  Psychiatric:        Mood and Affect: Mood normal.        Behavior: Behavior normal.        Thought Content: Thought content normal.    DG Chest 2 View  Result Date: 05/23/2021 CLINICAL DATA:  Shortness of breath.  Chest pain. EXAM: CHEST - 2 VIEW COMPARISON:  Apr 05, 2020 FINDINGS: The heart size and mediastinal contours are within normal limits. Both lungs are clear. The visualized skeletal structures are unremarkable. IMPRESSION: No active cardiopulmonary disease. Electronically Signed   By: Dorise Bullion III M.D   On: 05/23/2021 18:06    ED ECG REPORT   Date: 05/26/2021  Rate: 85bpm  Rhythm: normal sinus rhythm  QRS Axis: normal  Intervals: normal  ST/T Wave abnormalities: normal  Conduction Disutrbances:none  Narrative Interpretation: Sinus rhythm at 85 bpm, no acute changes.  Old EKG Reviewed: unchanged  I have personally reviewed the EKG tracing and agree with the computerized printout as noted.  Assessment and Plan :   PDMP not reviewed this encounter.  1. Shortness of breath   2. Left chest  pressure     Patient is asymptomatic in clinic, physical exam findings reassuring. EKG without acute changes, negative chest x-ray. COVID 19 testing pending. Vital signs stable for outpatient management and close follow up with PCP. Recommended supportive care. Counseled patient on potential for adverse effects with medications prescribed/recommended today, strict ER and return-to-clinic precautions discussed, patient verbalized understanding.    Jaynee Eagles, [place] 05/26/21 [phone]

## 2021-05-23 NOTE — ED Triage Notes (Signed)
Pt reports pressure in left side of chest with shortness of breath but denies chest pain. Symptoms began a week ago. Reports a high blood pressure (161/95) reading at home. Pt reports while she was sleeping she felt as though she was struggling to wake up.

## 2021-05-24 LAB — SARS CORONAVIRUS 2 (TAT 6-24 HRS): SARS Coronavirus 2: NEGATIVE

## 2021-05-26 ENCOUNTER — Telehealth: Payer: Self-pay

## 2021-05-26 ENCOUNTER — Telehealth: Payer: Medicare Other

## 2021-05-26 NOTE — Telephone Encounter (Signed)
  Care Management   Follow Up Note   05/26/2021 Name: Allison Franco MRN: [id number] DOB: 10/06/1941   Referred by: [name], FNP Reason for referral : Chronic Care Management (Inbound call from patient )  Voice message received from patient today requesting a return call.  An unsuccessful telephone outreach was attempted today. The patient was referred to the case management team for assistance with care management and care coordination.   Follow Up Plan: A HIPPA compliant phone message was left for the patient providing contact information and requesting a return call.   Barb Merino, RN, BSN, CCM Care Management Coordinator [facility] [place] [facility]  Direct Phone: [phone]

## 2021-05-27 ENCOUNTER — Other Ambulatory Visit (HOSPITAL_COMMUNITY): Payer: Self-pay

## 2021-05-27 MED FILL — Glucose Blood Test Strip: 50 days supply | Qty: 50 | Fill #0 | Status: CN

## 2021-05-27 MED FILL — Metformin HCl Tab 500 MG: ORAL | 30 days supply | Qty: 30 | Fill #2 | Status: CN

## 2021-05-27 MED FILL — Olmesartan Medoxomil Tab 20 MG: ORAL | 90 days supply | Qty: 90 | Fill #0 | Status: CN

## 2021-05-27 NOTE — Patient Instructions (Signed)
Visit Information It was great speaking with you today!  Please let me know if you have any questions about our visit.   Goals Addressed             This Visit's Progress    Manage My Medicine       Timeframe:  Long-Range Goal Priority:  High Start Date:                             Expected End Date:                       Follow Up Date 09/14/2021    - call for medicine refill 2 or 3 days before it runs out - call if I am sick and can't take my medicine - keep a list of all the medicines I take; vitamins and herbals too - learn to read medicine labels - use a pillbox to sort medicine - use an alarm clock or phone to remind me to take my medicine    Why is this important?   These steps will help you keep on track with your medicines.           Patient Care Plan: CCM Pharmacy Care Plan     Problem Identified: HTN, HLD, DM II      Long-Range Goal: Disease Management   This Visit's Progress: On track  Note:     Current Barriers:  Unable to independently monitor therapeutic efficacy  Pharmacist Clinical Goal(s):  Patient will achieve adherence to monitoring guidelines and medication adherence to achieve therapeutic efficacy through collaboration with PharmD and provider.   Interventions: 1:1 collaboration with [name], FNP regarding development and update of comprehensive plan of care as evidenced by provider attestation and co-signature Inter-disciplinary care team collaboration (see longitudinal plan of care) Comprehensive medication review performed; medication list updated in electronic medical record  Hypertension (BP goal <130/80) -Controlled -Current treatment: Clonidine 0.1 mg taking 1 tablet by mouth daily as needed for systolic BP greater than 341 Olmesartan 20 mg taking 1 tablet daily -Current home readings: 140/82 -Current exercise habits: none noted at this time -Denies hypotensive/hypertensive symptoms -Educated on Importance of home  blood pressure monitoring; Proper BP monitoring technique; -Counseled to monitor BP at home 5 days, document, and provide log at future appointments -Recommended to continue current medication  Hyperlipidemia: (LDL goal < 70) -Uncontrolled -Current treatment: Not currently taking any medication  -Current dietary patterns: patient reports she is eating plenty of fresh fruit and vegetables. -Current exercise habits: patient is walking frquently  -Educated on Cholesterol goals;  Benefits of statin for ASCVD risk reduction; Importance of limiting foods high in cholesterol; Exercise goal of 150 minutes per week; -Counseled on the importance of taking medication three times per week Recommended patient be started on Atorvastatin 10 mg on Monday, Wednesday and Friday.   Diabetes (A1c goal <7%) -Controlled -Current medications: Metformin 500 mg tablet once per day  -Current home glucose readings fasting glucose: 90-120 -Denies hypoglycemic/hyperglycemic symptoms -Current meal patterns: will discuss during next office visit -Current exercise: walking often to the grocery store it takes about 50 minutes  -Educated on A1c and blood sugar goals; Exercise goal of 150 minutes per week; Prevention and management of hypoglycemic episodes; -Counseled to check feet daily and get yearly eye exams -Recommend patient have lab work completed.  -Recommended to continue current medication   Patient Goals/Self-Care Activities Patient  will:  - take medications as prescribed  Follow Up Plan: The patient has been provided with contact information for the care management team and has been advised to call with any health related questions or concerns.        Patient agreed to services and verbal consent obtained.   The patient verbalized understanding of instructions, educational materials, and care plan provided today and agreed to receive a mailed copy of patient instructions, educational  materials, and care plan.   [name], PharmD Clinical Pharmacist [facility] [phone]

## 2021-05-27 NOTE — Progress Notes (Signed)
Chronic Care Management Pharmacy Note  05/27/2021 Name:  Sritha Chauncey MRN:  [id number] DOB:  Apr 18, 1941  Summary: Patient reports to the office for her visit with the pharmacist in office.   Recommendations/Changes made from today's visit: Recommend patient be seen by the eye doctor.  Recommend patient be seen by orthopedic doctor.  Recommend patient be started on Atorvastatin 10 mg tablet medication.   Plan: Patient reports she is going to the eye doctor.  Patient to take Atorvastatin 10 mg tablet on Monday, Wednesday and Friday.  Patient to come back in 6 weeks for a lipid panel and full chemistry.   Subjective: Tammela Bales is an 80 y.o. year old female who is a primary patient of [name], [place].  The CCM team was consulted for assistance with disease management and care coordination needs.    Engaged with patient by telephone for follow up visit in response to provider referral for pharmacy case management and/or care coordination services.   Consent to Services:  The patient was given information about Chronic Care Management services, agreed to services, and gave verbal consent prior to initiation of services.  Please see initial visit note for detailed documentation.   Patient Care Team: [name], FNP as PCP - General (General Practice) Mayford Knife, [facility] (Pharmacist)  Recent office visits: 04/19/2021 PCP OV  Recent consult visits: 02/24/2021  Hospital visits: None in previous 6 months   Objective:  Lab Results  Component Value Date   CREATININE 0.86 04/19/2021   BUN 17 04/19/2021   GFRNONAA 67 11/11/2020   GFRAA 78 11/11/2020   NA 137 04/19/2021   K 4.8 04/19/2021   CALCIUM 9.1 04/19/2021   CO2 25 04/19/2021   GLUCOSE 123 (H) 04/19/2021    Lab Results  Component Value Date/Time   HGBA1C 5.8 (H) 04/19/2021 09:59 AM   HGBA1C 5.9 (H) 11/11/2020 12:32 PM   MICROALBUR 10 01/20/2021 12:36 PM    Last diabetic Eye exam:  Lab Results   Component Value Date/Time   HMDIABEYEEXA No Retinopathy 07/15/2020 12:00 AM    Last diabetic Foot exam: No results found for: HMDIABFOOTEX   Lab Results  Component Value Date   CHOL 166 04/19/2021   HDL 42 04/19/2021   LDLCALC 88 04/19/2021   TRIG 210 (H) 04/19/2021   CHOLHDL 4.0 04/19/2021    Hepatic Function Latest Ref Rng & Units 04/19/2021 04/05/2020 01/15/2020  Total Protein 6.0 - 8.5 g/dL 7.2 6.5 7.0  Albumin 3.7 - 4.7 g/dL 4.4 3.4(L) 4.0  AST 0 - 40 IU/L '20 22 16  ' ALT 0 - 32 IU/L '20 20 19  ' Alk Phosphatase 44 - 121 IU/L 109 81 111  Total Bilirubin 0.0 - 1.2 mg/dL 0.4 0.7 0.2  Bilirubin, Direct 0.0 - 0.2 mg/dL - - -    Lab Results  Component Value Date/Time   TSH 1.298 07/12/2018 07:58 AM   FREET4 1.26 07/12/2018 08:37 AM    CBC Latest Ref Rng & Units 04/05/2020 10/13/2019 08/06/2019  WBC 4.0 - 10.5 K/uL 6.0 7.5 7.6  Hemoglobin 12.0 - 15.0 g/dL 12.7 12.6 13.2  Hematocrit 36.0 - 46.0 % 41.2 40.2 41.6  Platelets 150 - 400 K/uL 248 239 237    No results found for: VD25OH  Clinical ASCVD: Yes  The 10-year ASCVD risk score Mikey Bussing DC Jr., et al., 2013) is: 35.7%   Values used to calculate the score:     Age: 80 years     Sex: Female  Is Non-Hispanic African American: Yes     Diabetic: Yes     Tobacco smoker: No     Systolic Blood Pressure: 491 mmHg     Is BP treated: Yes     HDL Cholesterol: 42 mg/dL     Total Cholesterol: 166 mg/dL    Depression screen [facility] 2/9 02/24/2021 01/20/2021 01/15/2020  Decreased Interest 0 0 0  Down, Depressed, Hopeless 0 0 0  PHQ - 2 Score 0 0 0  Altered sleeping - - 3  Tired, decreased energy - - 0  Change in appetite - - 0  Feeling bad or failure about yourself  - - 0  Trouble concentrating - - 0  Moving slowly or fidgety/restless - - 0  Suicidal thoughts - - 0  PHQ-9 Score - - 3  Difficult doing work/chores - - Not difficult at all  Some recent data might be hidden     Social History   Tobacco Use  Smoking Status Never   Smokeless Tobacco Never   BP Readings from Last 3 Encounters:  05/23/21 (!) 158/73  04/19/21 118/78  01/20/21 132/80   Pulse Readings from Last 3 Encounters:  05/23/21 88  04/19/21 86  01/20/21 74   Wt Readings from Last 3 Encounters:  04/19/21 183 lb 12.8 oz (83.4 kg)  02/24/21 180 lb (81.6 kg)  01/20/21 183 lb 3.2 oz (83.1 kg)   BMI Readings from Last 3 Encounters:  04/19/21 32.56 kg/m  02/24/21 31.89 kg/m  01/20/21 34.17 kg/m    Assessment/Interventions: Review of patient past medical history, allergies, medications, health status, including review of consultants reports, laboratory and other test data, was performed as part of comprehensive evaluation and provision of chronic care management services.   SDOH:  (Social Determinants of Health) assessments and interventions performed: No  SDOH Screenings   Alcohol Screen: Not on file  Depression (PHQ2-9): Low Risk    PHQ-2 Score: 0  Financial Resource Strain: Medium Risk   Difficulty of Paying Living Expenses: Somewhat hard  Food Insecurity: No Food Insecurity   Worried About Charity fundraiser in the Last Year: Never true   Ran Out of Food in the Last Year: Never true  Housing: Not on file  Physical Activity: Inactive   Days of Exercise per Week: 0 days   Minutes of Exercise per Session: 0 min  Social Connections: Not on file  Stress: No Stress Concern Present   Feeling of Stress : Not at all  Tobacco Use: Low Risk    Smoking Tobacco Use: Never   Smokeless Tobacco Use: Never  Transportation Needs: No Transportation Needs   Lack of Transportation (Medical): No   Lack of Transportation (Non-Medical): No    CCM Care Plan  Allergies  Allergen Reactions   Aspirin Other (See Comments)    insomnia   Hydroxyzine Other (See Comments)    insomnia   Valium [Diazepam] Other (See Comments)    Insomnia, 05/10/16 pt states she took as young person- made her depressed    Medications Reviewed Today     Reviewed  by Chalmers Guest, RN (Registered Nurse) on 05/23/21 at 1747  Med List Status: <None>   Medication Order Taking? Sig Documenting Provider Last Dose Status Informant  acetaminophen (TYLENOL) 500 MG tablet [id number] Yes Take 1 tablet (500 mg total) by mouth every 6 (six) hours as needed for mild pain, moderate pain or headache. Reported on 05/10/2016 Kalman Drape, PA  Active   Alcohol Swabs (  ALCOHOL WIPES) 70 % PADS [id number]  by Does not apply route. Use with blood sugar check and injection of insulin [provider]  Active Self  blood glucose meter kit and supplies KIT [id number]  Dispense based on patient and insurance preference. Use up to four times daily as directed. (FOR ICD-9 250.00, 250.01). Florencia Reasons, MD  Active Self  Cholecalciferol (VITAMIN D3) 25 MCG (1000 UT) CAPS [id number] Yes Take 1 capsule by mouth daily. [provider]  Active   clonazePAM (KLONOPIN) 0.5 MG tablet [id number] Yes Take 1 tablet (0.5 mg total) by mouth daily as needed for anxiety. [name], FNP  Active   cloNIDine (CATAPRES) 0.1 MG tablet [id number] Yes Take 1 tablet by mouth daily if systolic blood pressure over 160. If remains elevated call to office [name], FNP  Active   COVID-19 mRNA vaccine, Pfizer, 30 MCG/0.3ML injection [id number]  USE AS DIRECTED Carlyle Basques, MD  Active   diclofenac Sodium (VOLTAREN) 1 % GEL [id number]  Apply 2 g topically 4 (four) times daily.  Patient not taking: No sig reported   [name], FNP  Active   diclofenac Sodium (VOLTAREN) 1 % GEL [id number]  APPLY 2 G TOPICALLY 4 (FOUR) TIMES DAILY.  Patient not taking: No sig reported   [name], FNP  Active   glucose blood test strip [id number]  USE AS DIRECTED   Active   latanoprost (XALATAN) 0.005 % ophthalmic solution [id number]  PLACE 1 DROP [facility] EYE ONCE A DAY   Active            Med Note Tacey Heap, Oretha Ellis May 23, 2021  5:46 PM) Pt states not taking due to side effects  metFORMIN  (GLUCOPHAGE) 500 MG tablet [id number] Yes TAKE 1 TABLET BY MOUTH WITH A MEAL ONCE A DAY 30 DAY(S)   Active   Multiple Vitamins-Minerals (CENTRUM SILVER 50+WOMEN) TABS [id number] Yes Take 1 tablet by mouth daily. [provider]  Active Self  olmesartan (BENICAR) 20 MG tablet [id number]  TAKE 1 TABLET (20 MG TOTAL) BY MOUTH DAILY. [name], FNP  Active            Med Note Tacey Heap, Oretha Ellis May 23, 2021  5:46 PM) Pt unsure if taking  Tdap (BOOSTRIX) 5-2.5-18.5 LF-MCG/0.5 injection [id number]  INJECT 0.5 MLS INTO THE MUSCLE ONCE FOR 1 DOSE. [name], FNP  Active             Patient Active Problem List   Diagnosis Date Noted   Primary open angle glaucoma (POAG) of both eyes, severe stage 07/16/2020   Inguinal hernia with bowel obstruction 08/07/2019   Incarcerated left inguinal hernia 08/07/2019   Anemia 08/31/2018   Essential hypertension 07/03/2018   Type 2 diabetes mellitus without complication, without long-term current use of insulin ([place]) 07/02/2018   Obesity 10/20/2013   ALLERGIC RHINITIS 08/11/2009   INSOMNIA 07/17/2008    Immunization History  Administered Date(s) Administered   Fluad Quad(high Dose 65+) 11/11/2020   Influenza Whole 11/18/2009   Influenza-Unspecified 09/24/2018   PFIZER(Purple Top)SARS-COV-2 Vaccination 02/14/2020, 03/10/2020, 10/28/2020   Pneumococcal Conjugate-13 01/16/2020   Pneumococcal Polysaccharide-23 02/01/2007   Td 02/01/2007   Tdap 01/09/2020    Conditions to be addressed/monitored:  Hypertension, Hyperlipidemia, and Diabetes  Care Plan : [place]  Updates made by Mayford Knife, [place] since 05/27/2021 12:00 AM     Problem: HTN, HLD, DM II      Long-Range  Goal: Disease Management   This Visit's Progress: On track  Note:     Current Barriers:  Unable to independently monitor therapeutic efficacy  Pharmacist Clinical Goal(s):  Patient will achieve adherence to monitoring guidelines and  medication adherence to achieve therapeutic efficacy through collaboration with PharmD and provider.   Interventions: 1:1 collaboration with [name], FNP regarding development and update of comprehensive plan of care as evidenced by provider attestation and co-signature Inter-disciplinary care team collaboration (see longitudinal plan of care) Comprehensive medication review performed; medication list updated in electronic medical record  Hypertension (BP goal <130/80) -Controlled -Current treatment: Clonidine 0.1 mg taking 1 tablet by mouth daily as needed for systolic BP greater than 941 Olmesartan 20 mg taking 1 tablet daily -Current home readings: 140/82 -Current exercise habits: none noted at this time -Denies hypotensive/hypertensive symptoms -Educated on Importance of home blood pressure monitoring; Proper BP monitoring technique; -Counseled to monitor BP at home 5 days, document, and provide log at future appointments -Recommended to continue current medication  Hyperlipidemia: (LDL goal < 70) -Uncontrolled -Current treatment: Not currently taking any medication  -Current dietary patterns: patient reports she is eating plenty of fresh fruit and vegetables. -Current exercise habits: patient is walking frquently  -Educated on Cholesterol goals;  Benefits of statin for ASCVD risk reduction; Importance of limiting foods high in cholesterol; Exercise goal of 150 minutes per week; -Counseled on the importance of taking medication three times per week Recommended patient be started on Atorvastatin 10 mg on Monday, Wednesday and Friday.   Diabetes (A1c goal <7%) -Controlled -Current medications: Metformin 500 mg tablet once per day  -Current home glucose readings fasting glucose: 90-120 -Denies hypoglycemic/hyperglycemic symptoms -Current meal patterns: will discuss during next office visit -Current exercise: walking often to the grocery store it takes about 50 minutes   -Educated on A1c and blood sugar goals; Exercise goal of 150 minutes per week; Prevention and management of hypoglycemic episodes; -Counseled to check feet daily and get yearly eye exams -Recommend patient have lab work completed.  -Recommended to continue current medication   Patient Goals/Self-Care Activities Patient will:  - take medications as prescribed  Follow Up Plan: The patient has been provided with contact information for the care management team and has been advised to call with any health related questions or concerns.         Medication Assistance: None required.  Patient affirms current coverage meets needs.  Compliance/Adherence/Medication fill history: Care Gaps: COVID -19 Booster vaccine DEXA scan Foot Exam  Star-Rating Drugs: Metformin 1000 mg    Patient's preferred pharmacy is:  [facility] [place]. [address], [place] [address] Phone: [phone] Fax: [phone]  [place] [address] Phone: [phone] Fax: [phone]  Uses pill box? No - Patient keeps all of her medications in bottles Pt endorses 85% compliance  We discussed: Benefits of medication synchronization, packaging and delivery as well as enhanced pharmacist oversight with [employer]. Patient decided to: Continue current medication management strategy  Care Plan and Follow Up Patient Decision:  Patient agrees to Care Plan and Follow-up.  Plan: The patient has been provided with contact information for the care management team and has been advised to call with any health related questions or concerns.   [name], PharmD Clinical Pharmacist [facility] [phone]

## 2021-06-02 ENCOUNTER — Other Ambulatory Visit: Payer: Self-pay

## 2021-06-02 ENCOUNTER — Other Ambulatory Visit (HOSPITAL_COMMUNITY): Payer: Self-pay

## 2021-06-02 ENCOUNTER — Ambulatory Visit: Payer: Self-pay | Admitting: Nurse Practitioner

## 2021-06-02 DIAGNOSIS — I10 Essential (primary) hypertension: Secondary | ICD-10-CM

## 2021-06-02 MED ORDER — CLONIDINE HCL 0.1 MG PO TABS
ORAL_TABLET | ORAL | 1 refills | Status: DC
  Filled 2021-06-02 – 2021-11-30 (×2): qty 90, 90d supply, fill #0
  Filled 2021-11-30: qty 90, 90d supply, fill #1

## 2021-06-02 MED ORDER — VITAMIN D3 25 MCG (1000 UT) PO CAPS
1.0000 | ORAL_CAPSULE | Freq: Every day | ORAL | 1 refills | Status: AC
  Filled 2021-06-02: qty 60, 60d supply, fill #0

## 2021-06-02 MED ORDER — OLMESARTAN MEDOXOMIL 20 MG PO TABS
ORAL_TABLET | Freq: Every day | ORAL | 1 refills | Status: DC
  Filled 2021-06-02: qty 90, 90d supply, fill #0
  Filled 2021-09-09: qty 90, 90d supply, fill #1

## 2021-06-02 MED ORDER — METFORMIN HCL 500 MG PO TABS
500.0000 mg | ORAL_TABLET | Freq: Two times a day (BID) | ORAL | 1 refills | Status: DC
  Filled 2021-06-02 (×3): qty 90, 45d supply, fill #0
  Filled 2021-07-20: qty 90, 45d supply, fill #1

## 2021-06-03 ENCOUNTER — Ambulatory Visit: Payer: Self-pay | Admitting: Nurse Practitioner

## 2021-06-22 ENCOUNTER — Other Ambulatory Visit (HOSPITAL_COMMUNITY): Payer: Self-pay

## 2021-07-01 ENCOUNTER — Telehealth: Payer: Self-pay

## 2021-07-01 NOTE — Chronic Care Management (AMB) (Signed)
Chronic Care Management Pharmacy Assistant   Name: Allison Franco  MRN: [id number] DOB: 04/01/1941   Reason for Encounter: Disease State/ Hypertension  Recent office visits:  None  Recent consult visits:  None  Hospital visits:  Medication Reconciliation was completed by comparing discharge summary, patient's EMR and Pharmacy list, and upon discussion with patient.  Admitted to the hospital on 05-23-2021 due to shortness of breath. Discharge date was 05-23-2021. Discharged from [place]?Medications Started at [facility] Discharge:?? None  Medication Changes at Hospital Discharge: None  Medications Discontinued at Hospital Discharge: None  Medications that remain the same after Hospital Discharge:??  -All other medications will remain the same.    Medications: Outpatient Encounter Medications as of 07/01/2021  Medication Sig Note   acetaminophen (TYLENOL) 500 MG tablet Take 1 tablet (500 mg total) by mouth every 6 (six) hours as needed for mild pain, moderate pain or headache. Reported on 05/10/2016    Alcohol Swabs (ALCOHOL WIPES) 70 % PADS by Does not apply route. Use with blood sugar check and injection of insulin    blood glucose meter kit and supplies KIT Dispense based on patient and insurance preference. Use up to four times daily as directed. (FOR ICD-9 250.00, 250.01).    Cholecalciferol (VITAMIN D3) 25 MCG (1000 UT) CAPS Take 1 capsule (1,000 Units total) by mouth daily.    clonazePAM (KLONOPIN) 0.5 MG tablet Take 1 tablet (0.5 mg total) by mouth daily as needed for anxiety.    cloNIDine (CATAPRES) 0.1 MG tablet Take 1 tablet by mouth daily if systolic blood pressure over 160. If remains elevated call to office    COVID-19 mRNA vaccine, Pfizer, 30 MCG/0.3ML injection USE AS DIRECTED    diclofenac Sodium (VOLTAREN) 1 % GEL Apply 2 g topically 4 (four) times daily. (Patient not taking: No sig reported)    diclofenac Sodium (VOLTAREN) 1 % GEL APPLY 2 G  TOPICALLY 4 (FOUR) TIMES DAILY. (Patient not taking: No sig reported)    glucose blood test strip USE AS DIRECTED    latanoprost (XALATAN) 0.005 % ophthalmic solution PLACE 1 DROP EACH EYE ONCE A DAY 05/23/2021: Pt states not taking due to side effects   metFORMIN (GLUCOPHAGE) 500 MG tablet Take 1 tablet (500 mg total) by mouth 2 (two) times daily with a meal.    Multiple Vitamins-Minerals (CENTRUM SILVER 50+WOMEN) TABS Take 1 tablet by mouth daily.    olmesartan (BENICAR) 20 MG tablet TAKE 1 TABLET (20 MG TOTAL) BY MOUTH DAILY.    Tdap (BOOSTRIX) 5-2.5-18.5 LF-MCG/0.5 injection INJECT 0.5 MLS INTO THE MUSCLE ONCE FOR 1 DOSE.    No facility-administered encounter medications on file as of 07/01/2021.   Reviewed chart prior to disease state call. Spoke with patient regarding BP  Recent Office Vitals: BP Readings from Last 3 Encounters:  05/23/21 (!) 158/73  04/19/21 118/78  01/20/21 132/80   Pulse Readings from Last 3 Encounters:  05/23/21 88  04/19/21 86  01/20/21 74    Wt Readings from Last 3 Encounters:  04/19/21 183 lb 12.8 oz (83.4 kg)  02/24/21 180 lb (81.6 kg)  01/20/21 183 lb 3.2 oz (83.1 kg)     Kidney Function Lab Results  Component Value Date/Time   CREATININE 0.86 04/19/2021 09:59 AM   CREATININE 0.83 11/11/2020 12:32 PM   GFRNONAA 67 11/11/2020 12:32 PM   GFRAA 78 11/11/2020 12:32 PM    BMP Latest Ref Rng & Units 04/19/2021 11/11/2020 05/18/2020  Glucose 65 - 99 mg/dL 123(H) 103(H) 131(H)  BUN 8 - 27 mg/dL '17 16 20  ' Creatinine 0.57 - 1.00 mg/dL 0.86 0.83 0.86  BUN/Creat Ratio 12 - '28 20 19 23  ' Sodium 134 - 144 mmol/L 137 139 139  Potassium 3.5 - 5.2 mmol/L 4.8 4.6 4.5  Chloride 96 - 106 mmol/L 99 102 100  CO2 20 - 29 mmol/L '25 25 26  ' Calcium 8.7 - 10.3 mg/dL 9.1 8.9 9.1    Current antihypertensive regimen:  Clonidine 0.1 mg taking 1 tablet by mouth daily as needed for systolic BP greater than 151 Olmesartan 20 mg taking 1 tablet daily  How often are you  checking your Blood Pressure? daily  Current home BP readings: 114/80  What recent interventions/DTPs have been made by any provider to improve Blood Pressure control since last CPP Visit:  Educated on Importance of home blood pressure monitoring; Proper BP monitoring technique; Counseled to monitor BP at home 5 days, document, and provide log at future appointments  Any recent hospitalizations or ED visits since last visit with CPP? Yes  What diet changes have been made to improve Blood Pressure Control?  Patient states she is eating plenty of vegetables and fruits.  What exercise is being done to improve your Blood Pressure Control?  Patient states she walks daily.  Adherence Review: Is the patient currently on ACE/ARB medication? Yes Does the patient have >5 day gap between last estimated fill dates? No  NOTES: Patient is aware of telephone appointment with [name] CPP in October. Patient feels she is managing blood pressure.  Care Gaps: Shingrix overdue Dexa scan overdue Covid booster overdue Yearly foot exam overdue RAF= 1.002% Last medicare wellness 02-24-2021  Star Rating Drugs: Olmesartan 20 mg- Last filed 06-02-2021 [facility] Metformin 500 mg- Last filled 06-02-2021 90 [facility]  [place] Clinical Pharmacist Assistant [phone]

## 2021-07-05 ENCOUNTER — Other Ambulatory Visit (HOSPITAL_COMMUNITY): Payer: Self-pay

## 2021-07-19 ENCOUNTER — Other Ambulatory Visit: Payer: Self-pay | Admitting: Nurse Practitioner

## 2021-07-19 ENCOUNTER — Other Ambulatory Visit (HOSPITAL_COMMUNITY): Payer: Self-pay

## 2021-07-19 DIAGNOSIS — H9313 Tinnitus, bilateral: Secondary | ICD-10-CM

## 2021-07-19 MED ORDER — CLONAZEPAM 0.5 MG PO TABS: 0.5000 mg | ORAL_TABLET | Freq: Every day | ORAL | 2 refills | Status: DC | PRN

## 2021-07-20 ENCOUNTER — Other Ambulatory Visit: Payer: Self-pay | Admitting: Nurse Practitioner

## 2021-07-20 ENCOUNTER — Other Ambulatory Visit (HOSPITAL_COMMUNITY): Payer: Self-pay

## 2021-07-20 DIAGNOSIS — H9313 Tinnitus, bilateral: Secondary | ICD-10-CM

## 2021-07-20 MED ORDER — CLONAZEPAM 0.5 MG PO TABS
0.5000 mg | ORAL_TABLET | Freq: Every day | ORAL | 2 refills | Status: DC | PRN
  Filled 2021-07-20: qty 30, 30d supply, fill #0

## 2021-07-21 ENCOUNTER — Other Ambulatory Visit (HOSPITAL_COMMUNITY): Payer: Self-pay

## 2021-07-26 ENCOUNTER — Other Ambulatory Visit: Payer: Self-pay

## 2021-07-26 ENCOUNTER — Encounter (HOSPITAL_COMMUNITY): Payer: Self-pay | Admitting: Emergency Medicine

## 2021-07-26 ENCOUNTER — Ambulatory Visit (HOSPITAL_COMMUNITY)
Admission: EM | Admit: 2021-07-26 | Discharge: 2021-07-26 | Disposition: A | Discharge status: Home / Self Care | Payer: Medicare Other | Attending: Internal Medicine | Admitting: Internal Medicine

## 2021-07-26 DIAGNOSIS — I10 Essential (primary) hypertension: Secondary | ICD-10-CM | POA: Insufficient documentation

## 2021-07-26 DIAGNOSIS — Z7984 Long term (current) use of oral hypoglycemic drugs: Secondary | ICD-10-CM | POA: Insufficient documentation

## 2021-07-26 DIAGNOSIS — Z833 Family history of diabetes mellitus: Secondary | ICD-10-CM | POA: Insufficient documentation

## 2021-07-26 DIAGNOSIS — E119 Type 2 diabetes mellitus without complications: Secondary | ICD-10-CM | POA: Diagnosis not present

## 2021-07-26 DIAGNOSIS — Z79899 Other long term (current) drug therapy: Secondary | ICD-10-CM | POA: Insufficient documentation

## 2021-07-26 DIAGNOSIS — Z8249 Family history of ischemic heart disease and other diseases of the circulatory system: Secondary | ICD-10-CM | POA: Insufficient documentation

## 2021-07-26 DIAGNOSIS — Z886 Allergy status to analgesic agent status: Secondary | ICD-10-CM | POA: Diagnosis not present

## 2021-07-26 DIAGNOSIS — R5383 Other fatigue: Secondary | ICD-10-CM

## 2021-07-26 LAB — BASIC METABOLIC PANEL
Anion gap: 5 (ref 5–15)
BUN: 18 mg/dL (ref 8–23)
CO2: 30 mmol/L (ref 22–32)
Calcium: 9.3 mg/dL (ref 8.9–10.3)
Chloride: 101 mmol/L (ref 98–111)
Creatinine, Ser: 0.74 mg/dL (ref 0.44–1.00)
GFR, Estimated: >60 mL/min (ref >60)
Glucose, Bld: 99 mg/dL (ref 70–99)
Potassium: 4.6 mmol/L (ref 3.5–5.1)
Sodium: 136 mmol/L (ref 135–145)

## 2021-07-26 LAB — CBC WITH DIFFERENTIAL/PLATELET
Abs Immature Granulocytes: 0.03 10*3/uL (ref 0.00–0.07)
Basophils Absolute: 0 10*3/uL (ref 0.0–0.1)
Basophils Relative: 1 %
Eosinophils Absolute: 0.2 10*3/uL (ref 0.0–0.5)
Eosinophils Relative: 2 %
HCT: 42.2 % (ref 36.0–46.0)
Hemoglobin: 13 g/dL (ref 12.0–15.0)
Immature Granulocytes: 0 %
Lymphocytes Relative: 36 %
Lymphs Abs: 2.5 10*3/uL (ref 0.7–4.0)
MCH: 28.5 pg (ref 26.0–34.0)
MCHC: 30.8 g/dL (ref 30.0–36.0)
MCV: 92.5 fL (ref 80.0–100.0)
Monocytes Absolute: 0.7 10*3/uL (ref 0.1–1.0)
Monocytes Relative: 9 %
Neutro Abs: 3.6 10*3/uL (ref 1.7–7.7)
Neutrophils Relative %: 52 %
Platelets: 272 10*3/uL (ref 150–400)
RBC: 4.56 MIL/uL (ref 3.87–5.11)
RDW: 11.9 % (ref 11.5–15.5)
WBC: 6.9 10*3/uL (ref 4.0–10.5)
nRBC: 0 % (ref 0.0–0.2)

## 2021-07-26 LAB — TSH: TSH: 1.995 u[IU]/mL (ref 0.350–4.500)

## 2021-07-26 LAB — VITAMIN D 25 HYDROXY (VIT D DEFICIENCY, FRACTURES): Vit D, 25-Hydroxy: 59.23 ng/mL (ref 30–100)

## 2021-07-26 NOTE — Discharge Instructions (Addendum)
Continue medications as prescribed We will call you with recommendations if labs are abnormal If you have worsening symptoms please return to urgent care to be reevaluated.

## 2021-07-26 NOTE — ED Provider Notes (Signed)
[facility]    CSN: [id number] Arrival date & time: 07/26/21  1459      History   Chief Complaint Chief Complaint  Patient presents with   Pain    generalized    HPI Allison Franco is a 80 y.o. female with a history of hypertension and diabetes mellitus type 2 comes to urgent care with complaints of generalized fatigue over the past couple of days.  She has generalized fatigue with generalized body pains.  No fever or chills.  No sore throat cough or sputum production.  No fever or chills.  No swelling in the extremities.  No shortness of breath or sputum production.  No abdominal pain, nausea or diarrhea.   HPI  Past Medical History:  Diagnosis Date   Anxiety    Constipation    Depression    "following husband's death"   Diabetes mellitus without complication ([place])    Hypertension    KNEE PAIN, BILATERAL 02/13/2009   Qualifier: Diagnosis of  By: Hassell Done FNP, Tori Milks     Tinnitus     Patient Active Problem List   Diagnosis Date Noted   Primary open angle glaucoma (POAG) of both eyes, severe stage 07/16/2020   Inguinal hernia with bowel obstruction 08/07/2019   Incarcerated left inguinal hernia 08/07/2019   Anemia 08/31/2018   Essential hypertension 07/03/2018   Type 2 diabetes mellitus without complication, without long-term current use of insulin ([place]) 07/02/2018   Obesity 10/20/2013   ALLERGIC RHINITIS 08/11/2009   INSOMNIA 07/17/2008    Past Surgical History:  Procedure Laterality Date   ABDOMINAL HYSTERECTOMY     COLON RESECTION N/A 08/07/2019   Procedure: DIAGNOSTIC LAPAROSCOPY WITH OPEN LEFT INGUINAL HERNIA WITH MESH;  Surgeon: Johnathan Hausen, MD;  Location: [facility];  Service: General;  Laterality: N/A;   fribroid surgery      OB History   No obstetric history on file.      Home Medications    Prior to Admission medications   Medication Sig Start Date End Date Taking? Authorizing Provider  acetaminophen (TYLENOL) 500 MG tablet Take 1 tablet  (500 mg total) by mouth every 6 (six) hours as needed for mild pain, moderate pain or headache. Reported on 05/10/2016 08/09/19   Kalman Drape, PA  Alcohol Swabs (ALCOHOL WIPES) 70 % PADS by Does not apply route. Use with blood sugar check and injection of insulin    [provider]  blood glucose meter kit and supplies KIT Dispense based on patient and insurance preference. Use up to four times daily as directed. (FOR ICD-9 250.00, 250.01). 07/05/18   Florencia Reasons, MD  Cholecalciferol (VITAMIN D3) 25 MCG (1000 UT) CAPS Take 1 capsule (1,000 Units total) by mouth daily. 06/02/21   [name], FNP  clonazePAM (KLONOPIN) 0.5 MG tablet Take 1 tablet (0.5 mg total) by mouth daily as needed for anxiety. 07/20/21 01/16/22  [name], FNP  cloNIDine (CATAPRES) 0.1 MG tablet Take 1 tablet by mouth daily if systolic blood pressure over 160. If remains elevated call to office 06/02/21   [name], FNP  COVID-19 mRNA vaccine, Pfizer, 30 MCG/0.3ML injection USE AS DIRECTED 10/28/20 10/28/21  Carlyle Basques, MD  glucose blood test strip USE AS DIRECTED 02/12/21 02/12/22    latanoprost (XALATAN) 0.005 % ophthalmic solution PLACE 1 DROP EACH EYE ONCE A DAY 02/12/21 02/12/22    metFORMIN (GLUCOPHAGE) 500 MG tablet Take 1 tablet (500 mg total) by mouth 2 (two) times daily with a meal.  06/02/21 06/02/22  [name], FNP  Multiple Vitamins-Minerals (CENTRUM SILVER 50+WOMEN) TABS Take 1 tablet by mouth daily.    [provider]  olmesartan (BENICAR) 20 MG tablet TAKE 1 TABLET (20 MG TOTAL) BY MOUTH DAILY. 06/02/21 06/02/22  [name], FNP  Tdap Durwin Reges) 5-2.5-18.5 LF-MCG/0.5 injection INJECT 0.5 MLS INTO THE MUSCLE ONCE FOR 1 DOSE. 01/20/21 01/20/22  [name], FNP    Family History Family History  Problem Relation Age of Onset   Diabetes Mother    Hypertension Father     Social History Social History   Tobacco Use   Smoking status: Never   Smokeless tobacco: Never  Vaping Use   Vaping Use:  Never used  Substance Use Topics   Alcohol use: No   Drug use: No     Allergies   Aspirin, Hydroxyzine, and Valium [diazepam]   Review of Systems Review of Systems  Constitutional: Negative.   Genitourinary: Negative.   Musculoskeletal: Negative.   Neurological: Negative.     Physical Exam Triage Vital Signs ED Triage Vitals  Enc Vitals Group     BP 07/26/21 1556 (!) 168/85     Pulse Rate 07/26/21 1556 69     Resp 07/26/21 1556 16     Temp 07/26/21 1556 98.3 F (36.8 C)     Temp Source 07/26/21 1556 Oral     SpO2 07/26/21 1556 94 %     Weight --      Height --      Head Circumference --      Peak Flow --      Pain Score 07/26/21 1554 8     Pain Loc --      Pain Edu? --      Excl. in [place]? --    No data found.  Updated Vital Signs BP (!) 168/85   Pulse 69   Temp 98.3 F (36.8 C) (Oral)   Resp 16   SpO2 94%   Visual Acuity Right Eye Distance:   Left Eye Distance:   Bilateral Distance:    Right Eye Near:   Left Eye Near:    Bilateral Near:     Physical Exam Vitals and nursing note reviewed.  Constitutional:      General: She is not in acute distress.    Appearance: She is not ill-appearing.  Cardiovascular:     Rate and Rhythm: Normal rate and regular rhythm.     Pulses: Normal pulses.     Heart sounds: Normal heart sounds.  Pulmonary:     Effort: Pulmonary effort is normal.     Breath sounds: Normal breath sounds.  Abdominal:     General: Bowel sounds are normal.     Palpations: Abdomen is soft.  Musculoskeletal:        General: Normal range of motion.  Neurological:     Mental Status: She is alert.     UC Treatments / Results  Labs (all labs ordered are listed, but only abnormal results are displayed) Labs Reviewed  VITAMIN D 25 HYDROXY (VIT D DEFICIENCY, FRACTURES)  BASIC METABOLIC PANEL  CBC WITH DIFFERENTIAL/PLATELET  TSH    EKG   Radiology No results found.  Procedures Procedures (including critical care  time)  Medications Ordered in UC Medications - No data to display  Initial Impression / Assessment and Plan / UC Course  I have reviewed the triage vital signs and the nursing notes.  Pertinent labs & imaging results that were available during  my care of the patient were reviewed by me and considered in my medical decision making (see chart for details).     Fatigue: CBC, BMP, vitamin D level Continue medications We will call you with recommendations if labs are abnormal Return to urgent care if symptoms worsen. Final Clinical Impressions(s) / UC Diagnoses   Final diagnoses:  Fatigue, unspecified type     Discharge Instructions      Continue medications as prescribed We will call you with recommendations if labs are abnormal If you have worsening symptoms please return to urgent care to be reevaluated.   ED Prescriptions   None    PDMP not reviewed this encounter.   Chase Picket, MD 07/26/21 [phone]

## 2021-07-26 NOTE — ED Triage Notes (Signed)
PT reports pain all over for 3 days. Denies headache, sore throat, cough, congestion.  Notes that she feels very stiff when she wakes up, walking makes this better.

## 2021-08-05 ENCOUNTER — Other Ambulatory Visit: Payer: Self-pay | Admitting: Nurse Practitioner

## 2021-08-23 ENCOUNTER — Encounter: Payer: Self-pay | Admitting: Nurse Practitioner

## 2021-08-23 ENCOUNTER — Ambulatory Visit (INDEPENDENT_AMBULATORY_CARE_PROVIDER_SITE_OTHER): Payer: Medicare Other | Admitting: Nurse Practitioner

## 2021-08-23 ENCOUNTER — Other Ambulatory Visit (HOSPITAL_COMMUNITY): Payer: Self-pay

## 2021-08-23 ENCOUNTER — Other Ambulatory Visit: Payer: Self-pay

## 2021-08-23 VITALS — BP 128/62 | HR 66 | Temp 97.7°F | Ht 60.4 in | Wt 182.2 lb

## 2021-08-23 DIAGNOSIS — E2839 Other primary ovarian failure: Secondary | ICD-10-CM

## 2021-08-23 DIAGNOSIS — I10 Essential (primary) hypertension: Secondary | ICD-10-CM

## 2021-08-23 DIAGNOSIS — H9313 Tinnitus, bilateral: Secondary | ICD-10-CM

## 2021-08-23 DIAGNOSIS — F5101 Primary insomnia: Secondary | ICD-10-CM | POA: Diagnosis not present

## 2021-08-23 DIAGNOSIS — I700 Atherosclerosis of aorta: Secondary | ICD-10-CM

## 2021-08-23 DIAGNOSIS — Z6835 Body mass index (BMI) 35.0-35.9, adult: Secondary | ICD-10-CM

## 2021-08-23 DIAGNOSIS — Z23 Encounter for immunization: Secondary | ICD-10-CM

## 2021-08-23 DIAGNOSIS — E119 Type 2 diabetes mellitus without complications: Secondary | ICD-10-CM

## 2021-08-23 MED ORDER — ZOSTER VAC RECOMB ADJUVANTED 50 MCG/0.5ML IM SUSR
0.5000 mL | INTRAMUSCULAR | 1 refills | Status: DC
  Filled 2021-08-23 (×2): qty 0.5, 1d supply, fill #0
  Filled 2021-10-27: qty 0.5, 1d supply, fill #1

## 2021-08-23 MED ORDER — TRAZODONE HCL 50 MG PO TABS
50.0000 mg | ORAL_TABLET | Freq: Every day | ORAL | 2 refills | Status: DC
  Filled 2021-08-23: qty 30, 30d supply, fill #0
  Filled 2021-10-14: qty 30, 30d supply, fill #1
  Filled 2021-11-12: qty 30, 30d supply, fill #2

## 2021-08-23 MED ORDER — CLONAZEPAM 0.5 MG PO TABS
0.5000 mg | ORAL_TABLET | Freq: Every day | ORAL | 2 refills | Status: DC | PRN
  Filled 2021-08-23: qty 30, 30d supply, fill #0

## 2021-08-23 NOTE — Patient Instructions (Addendum)
Diabetes Mellitus and Nutrition, Adult When you have diabetes, or diabetes mellitus, it is very important to have healthy eating habits because your blood sugar (glucose) levels are greatly affected by what you eat and drink. Eating healthy foods in the right amounts, at about the same times every day, can help you: Control your blood glucose. Lower your risk of heart disease. Improve your blood pressure. Reach or maintain a healthy weight. What can affect my meal plan? Every person with diabetes is different, and each person has different needs for a meal plan. Your health care provider may recommend that you work with a dietitian to make a meal plan that is best for you. Your meal plan may vary depending on factors such as: The calories you need. The medicines you take. Your weight. Your blood glucose, blood pressure, and cholesterol levels. Your activity level. Other health conditions you have, such as heart or kidney disease. How do carbohydrates affect me? Carbohydrates, also called carbs, affect your blood glucose level more than any other type of food. Eating carbs naturally raises the amount of glucose in your blood. Carb counting is a method for keeping track of how many carbs you eat. Counting carbs is important to keep your blood glucose at a healthy level, especially if you use insulin or take certain oral diabetes medicines. It is important to know how many carbs you can safely have in each meal. This is different for every person. Your dietitian can help you calculate how many carbs you should have at each meal and for each snack. How does alcohol affect me? Alcohol can cause a sudden decrease in blood glucose (hypoglycemia), especially if you use insulin or take certain oral diabetes medicines. Hypoglycemia can be a life-threatening condition. Symptoms of hypoglycemia, such as sleepiness, dizziness, and confusion, are similar to symptoms of having too much alcohol. Do not drink  alcohol if: Your health care provider tells you not to drink. You are pregnant, may be pregnant, or are planning to become pregnant. If you drink alcohol: Do not drink on an empty stomach. Limit how much you use to: 0-1 drink a day for women. 0-2 drinks a day for men. Be aware of how much alcohol is in your drink. In the [place] one drink equals one 12 oz bottle of beer (355 mL), one 5 oz glass of wine (148 mL), or one 1 oz glass of hard liquor (44 mL). Keep yourself hydrated with water, diet soda, or unsweetened iced tea. Keep in mind that regular soda, juice, and other mixers may contain a lot of sugar and must be counted as carbs. What are tips for following this plan? Reading food labels Start by checking the serving size on the "Nutrition Facts" label of packaged foods and drinks. The amount of calories, carbs, fats, and other nutrients listed on the label is based on one serving of the item. Many items contain more than one serving per package. Check the total grams (g) of carbs in one serving. You can calculate the number of servings of carbs in one serving by dividing the total carbs by 15. For example, if a food has 30 g of total carbs per serving, it would be equal to 2 servings of carbs. Check the number of grams (g) of saturated fats and trans fats in one serving. Choose foods that have a low amount or none of these fats. Check the number of milligrams (mg) of salt (sodium) in one serving. Most people should limit  total sodium intake to less than 2,300 mg per day. Always check the nutrition information of foods labeled as "low-fat" or "nonfat." These foods may be higher in added sugar or refined carbs and should be avoided. Talk to your dietitian to identify your daily goals for nutrients listed on the label. Shopping Avoid buying canned, pre-made, or processed foods. These foods tend to be high in fat, sodium, and added sugar. Shop around the outside edge of the grocery store. This  is where you will most often find fresh fruits and vegetables, bulk grains, fresh meats, and fresh dairy. Cooking Use low-heat cooking methods, such as baking, instead of high-heat cooking methods like deep frying. Cook using healthy oils, such as olive, canola, or sunflower oil. Avoid cooking with butter, cream, or high-fat meats. Meal planning Eat meals and snacks regularly, preferably at the same times every day. Avoid going long periods of time without eating. Eat foods that are high in fiber, such as fresh fruits, vegetables, beans, and whole grains. Talk with your dietitian about how many servings of carbs you can eat at each meal. Eat 4-6 oz (112-168 g) of lean protein each day, such as lean meat, chicken, fish, eggs, or tofu. One ounce (oz) of lean protein is equal to: 1 oz (28 g) of meat, chicken, or fish. 1 egg.  cup (62 g) of tofu. Eat some foods each day that contain healthy fats, such as avocado, nuts, seeds, and fish. What foods should I eat? Fruits Berries. Apples. Oranges. Peaches. Apricots. Plums. Grapes. Mango. Papaya. Pomegranate. Kiwi. Cherries. Vegetables Lettuce. Spinach. Leafy greens, including kale, chard, collard greens, and mustard greens. Beets. Cauliflower. Cabbage. Broccoli. Carrots. Green beans. Tomatoes. Peppers. Onions. Cucumbers. Brussels sprouts. Grains Whole grains, such as whole-wheat or whole-grain bread, crackers, tortillas, cereal, and pasta. Unsweetened oatmeal. Quinoa. Brown or wild rice. Meats and other proteins Seafood. Poultry without skin. Lean cuts of poultry and beef. Tofu. Nuts. Seeds. Dairy Low-fat or fat-free dairy products such as milk, yogurt, and cheese. The items listed above may not be a complete list of foods and beverages you can eat. Contact a dietitian for more information. What foods should I avoid? Fruits Fruits canned with syrup. Vegetables Canned vegetables. Frozen vegetables with butter or cream sauce. Grains Refined  white flour and flour products such as bread, pasta, snack foods, and cereals. Avoid all processed foods. Meats and other proteins Fatty cuts of meat. Poultry with skin. Breaded or fried meats. Processed meat. Avoid saturated fats. Dairy Full-fat yogurt, cheese, or milk. Beverages Sweetened drinks, such as soda or iced tea. The items listed above may not be a complete list of foods and beverages you should avoid. Contact a dietitian for more information. Questions to ask a health care provider Do I need to meet with a diabetes educator? Do I need to meet with a dietitian? What number can I call if I have questions? When are the best times to check my blood glucose? Where to find more information: American Diabetes Association: diabetes.org Academy of Nutrition and Dietetics: www.eatright.[employer] of Diabetes and Digestive and Kidney Diseases: [employer] Association of Diabetes Care and Education Specialists: www.diabeteseducator.org Summary It is important to have healthy eating habits because your blood sugar (glucose) levels are greatly affected by what you eat and drink. A healthy meal plan will help you control your blood glucose and maintain a healthy lifestyle. Your health care provider may recommend that you work with a dietitian to make a meal plan that  is best for you. Keep in mind that carbohydrates (carbs) and alcohol have immediate effects on your blood glucose levels. It is important to count carbs and to use alcohol carefully. This information is not intended to replace advice given to you by your health care provider. Make sure you discuss any questions you have with your health care provider. Document Revised: 10/22/2019 Document Reviewed: 10/22/2019 Elsevier Patient Education  [place].   Take clonazepam every other day for 2 weeks then every 3rd day for 2 weeks then can stop.  Take the new medication Trazadone on the days you do not take  the clonazepam. DO NOT TAKE BOTH AT THE SAME TIME!!

## 2021-08-23 NOTE — Progress Notes (Signed)
I,Tianna Badgett,acting as a scribe for  , FNP.,have documented all relevant documentation on the behalf of  , FNP,as directed by   , FNP while in the presence of  , FNP.  This visit occurred during the SARS-CoV-2 public health emergency.  Safety protocols were in place, including screening questions prior to the visit, additional usage of staff PPE, and extensive cleaning of exam room while observing appropriate contact time as indicated for disinfecting solutions.  Subjective:     Patient ID: Allison Franco , female    DOB: 11/28/1940 , 79 y.o.   MRN: [id number]   Chief Complaint  Patient presents with   Diabetes   Hypertension    HPI  Patient presents today for a diabetes and blood pressure f/u  Diabetes She presents for her follow-up diabetic visit. She has type 2 diabetes mellitus. Pertinent negatives for hypoglycemia include no dizziness or headaches. There are no diabetic associated symptoms. Pertinent negatives for diabetes include no chest pain, no polydipsia, no polyphagia and no polyuria. There are no hypoglycemic complications. Symptoms are stable. There are no diabetic complications. Risk factors for coronary artery disease include sedentary lifestyle and obesity. When asked about current treatments, none were reported. She is following a generally healthy diet. When asked about meal planning, she reported none. She has not had a previous visit with a dietitian. She rarely participates in exercise. (Blood sugar this morning was 101. ) An ACE inhibitor/angiotensin II receptor blocker is being taken. She does not see a podiatrist.Eye exam is not current.  Hypertension This is a chronic problem. The current episode started more than 1 year ago. The problem is controlled. Pertinent negatives include no chest pain, headaches or palpitations. There are no associated agents to hypertension. Past treatments include angiotensin blockers. There are  no compliance problems.  There is no history of angina. There is no history of chronic renal disease.    Past Medical History:  Diagnosis Date   Anxiety    Constipation    Depression    "following husband's death"   Diabetes mellitus without complication (HCC)    Hypertension    KNEE PAIN, BILATERAL 02/13/2009   Qualifier: Diagnosis of  By: Martin FNP, [place]     Tinnitus      Family History  Problem Relation Age of Onset   Diabetes Mother    Hypertension Father      Current Outpatient Medications:    traZODone (DESYREL) 50 MG tablet, Take 1 tablet (50 mg total) by mouth at bedtime., Disp: 30 tablet, Rfl: 2   Zoster Vaccine Adjuvanted (SHINGRIX) injection, Inject 0.5 mLs into the muscle once for 1 dose. Administer 2nd dose in 2-6 months  Please fax when each dose administered [phone], Disp: 0.5 mL, Rfl: 1   ACCU-CHEK GUIDE test strip, TEST 3 TIMES DAILY, Disp: 100 strip, Rfl: 3   acetaminophen (TYLENOL) 500 MG tablet, Take 1 tablet (500 mg total) by mouth every 6 (six) hours as needed for mild pain, moderate pain or headache. Reported on 05/10/2016, Disp: 30 tablet, Rfl: 0   Alcohol Swabs (ALCOHOL WIPES) 70 % PADS, by Does not apply route. Use with blood sugar check and injection of insulin, Disp: , Rfl:    blood glucose meter kit and supplies KIT, Dispense based on patient and insurance preference. Use up to four times daily as directed. (FOR ICD-9 250.00, 250.01)., Disp: 1 each, Rfl: 0   Cholecalciferol (VITAMIN D3) 25 MCG (1000 UT) CAPS, Take 1 capsule (1,000 Units   total) by mouth daily., Disp: 60 capsule, Rfl: 1   clonazePAM (KLONOPIN) 0.5 MG tablet, Take 1 tablet (0.5 mg total) by mouth daily as needed for anxiety., Disp: 30 tablet, Rfl: 2   cloNIDine (CATAPRES) 0.1 MG tablet, Take 1 tablet by mouth daily if systolic blood pressure over 160. If remains elevated call to office, Disp: 90 tablet, Rfl: 1   COVID-19 mRNA vaccine, Pfizer, 30 MCG/0.3ML injection, USE AS DIRECTED,  Disp: .3 mL, Rfl: 0   latanoprost (XALATAN) 0.005 % ophthalmic solution, PLACE 1 DROP EACH EYE ONCE A DAY, Disp: 2.5 mL, Rfl: 5   metFORMIN (GLUCOPHAGE) 500 MG tablet, Take 1 tablet (500 mg total) by mouth 2 (two) times daily with a meal., Disp: 90 tablet, Rfl: 1   Multiple Vitamins-Minerals (CENTRUM SILVER 50+WOMEN) TABS, Take 1 tablet by mouth daily., Disp: , Rfl:    olmesartan (BENICAR) 20 MG tablet, TAKE 1 TABLET (20 MG TOTAL) BY MOUTH DAILY., Disp: 90 tablet, Rfl: 1   Tdap (BOOSTRIX) 5-2.5-18.5 LF-MCG/0.5 injection, INJECT 0.5 MLS INTO THE MUSCLE ONCE FOR 1 DOSE., Disp: .5 mL, Rfl: 0   Allergies  Allergen Reactions   Aspirin Other (See Comments)    insomnia   Hydroxyzine Other (See Comments)    insomnia   Valium [Diazepam] Other (See Comments)    Insomnia, 05/10/16 pt states she took as young person- made her depressed     Review of Systems  Constitutional: Negative.   Respiratory: Negative.    Cardiovascular: Negative.  Negative for chest pain, palpitations and leg swelling.  Gastrointestinal: Negative.   Endocrine: Negative for polydipsia, polyphagia and polyuria.  Neurological: Negative.  Negative for dizziness and headaches.  Psychiatric/Behavioral: Negative.      Today's Vitals   08/23/21 0953  BP: 128/62  Pulse: 66  Temp: 97.7 F (36.5 C)  TempSrc: Oral  Weight: 182 lb 3.2 oz (82.6 kg)  Height: 5' 0.4" (1.534 m)   Body mass index is 35.11 kg/m.  Wt Readings from Last 3 Encounters:  08/23/21 182 lb 3.2 oz (82.6 kg)  04/19/21 183 lb 12.8 oz (83.4 kg)  02/24/21 180 lb (81.6 kg)    Objective:  Physical Exam Vitals reviewed.  Constitutional:      General: She is not in acute distress.    Appearance: Normal appearance.  Cardiovascular:     Rate and Rhythm: Normal rate and regular rhythm.     Pulses: Normal pulses.     Heart sounds: Normal heart sounds. No murmur heard. Pulmonary:     Effort: Pulmonary effort is normal. No respiratory distress.     Breath  sounds: Normal breath sounds. No wheezing.  Musculoskeletal:        General: No swelling, tenderness (mild crepitus noted to lateral knee) or deformity. Normal range of motion.  Skin:    General: Skin is warm and dry.     Capillary Refill: Capillary refill takes less than 2 seconds.  Neurological:     General: No focal deficit present.     Mental Status: She is alert and oriented to person, place, and time.     Cranial Nerves: No cranial nerve deficit.     Motor: No weakness.  Psychiatric:        Mood and Affect: Mood normal. Mood is not anxious.        Behavior: Behavior normal.        Thought Content: Thought content normal.        Judgment: Judgment normal.          Assessment And Plan:     1. Type 2 diabetes mellitus without complication, without long-term current use of insulin (HCC) Comments: Great control, continue current medications - Ambulatory referral to Ophthalmology - Hemoglobin A1c  2. Essential hypertension Comments: Blood pressure is well controlled   3. Aortic atherosclerosis ([facility]CC) Comments: Continue statin, tolerating well - Lipid panel  4. Need for influenza vaccination Influenza vaccine administered Encouraged to take Tylenol as needed for fever or muscle aches. - Flu Vaccine QUAD High Dose(Fluad)  5. Class 2 severe obesity due to excess calories with serious comorbidity and body mass index (BMI) of 35.0 to 35.9 in adult (HCC) Chronic Discussed healthy diet and regular exercise options  Encouraged to exercise at least 150 minutes per week with 2 days of strength training as tolerated.  6. Decreased estrogen level Comments: Will place order for bone density - DG Bone Density; Future  7. Encounter for immunization - Zoster Vaccine Adjuvanted (SHINGRIX) injection; Inject 0.5 mLs into the muscle once for 1 dose. Administer 2nd dose in 2-6 months  Please fax when each dose administered [phone]  Dispense: 0.5 mL; Refill: 1  8. Tinnitus of both  ears Comments: She has been taking clonazepam but would like to stop due to risk for dependence - clonazePAM (KLONOPIN) 0.5 MG tablet; Take 1 tablet (0.5 mg total) by mouth daily as needed for anxiety.  Dispense: 30 tablet; Refill: 2  9. Primary insomnia Comments: Will try trazadone for sleep to try to wean off clonazepam, she will take every other day for 2 weeks then every 3rd day then d/c - traZODone (DESYREL) 50 MG tablet; Take 1 tablet (50 mg total) by mouth at bedtime.  Dispense: 30 tablet; Refill: 2  She is encouraged to strive for BMI less than 30 to decrease cardiac risk. Advised to aim for at least 150 minutes of exercise per week.    Patient was given opportunity to ask questions. Patient verbalized understanding of the plan and was able to repeat key elements of the plan. All questions were answered to their satisfaction.   , FNP   I,  , FNP, have reviewed all documentation for this visit. The documentation on 08/23/21 for the exam, diagnosis, procedures, and orders are all accurate and complete.   IF YOU HAVE BEEN REFERRED TO A SPECIALIST, IT MAY TAKE 1-2 WEEKS TO SCHEDULE/PROCESS THE REFERRAL. IF YOU HAVE NOT HEARD FROM US/SPECIALIST IN TWO WEEKS, PLEASE GIVE US A CALL AT [phone] X [phone].   THE PATIENT IS ENCOURAGED TO PRACTICE SOCIAL DISTANCING DUE TO THE COVID-19 PANDEMIC.    

## 2021-08-24 ENCOUNTER — Telehealth: Payer: Self-pay

## 2021-08-24 ENCOUNTER — Other Ambulatory Visit (HOSPITAL_COMMUNITY): Payer: Self-pay

## 2021-08-24 LAB — LIPID PANEL
Chol/HDL Ratio: 4.3 ratio (ref 0.0–4.4)
Cholesterol, Total: 170 mg/dL (ref 100–199)
HDL: 40 mg/dL (ref >39)
LDL Chol Calc (NIH): 103 mg/dL — ABNORMAL HIGH (ref 0–99)
Triglycerides: 153 mg/dL — ABNORMAL HIGH (ref 0–149)
VLDL Cholesterol Cal: 27 mg/dL (ref 5–40)

## 2021-08-24 LAB — HEMOGLOBIN A1C
Est. average glucose Bld gHb Est-mCnc: 117 mg/dL
Hgb A1c MFr Bld: 5.7 % — ABNORMAL HIGH (ref 4.8–5.6)

## 2021-08-25 ENCOUNTER — Telehealth: Payer: Medicare Other

## 2021-08-25 ENCOUNTER — Ambulatory Visit (INDEPENDENT_AMBULATORY_CARE_PROVIDER_SITE_OTHER): Payer: Medicare Other

## 2021-08-25 ENCOUNTER — Telehealth: Payer: Self-pay

## 2021-08-25 ENCOUNTER — Other Ambulatory Visit (HOSPITAL_COMMUNITY): Payer: Self-pay

## 2021-08-25 DIAGNOSIS — I10 Essential (primary) hypertension: Secondary | ICD-10-CM

## 2021-08-25 DIAGNOSIS — G8929 Other chronic pain: Secondary | ICD-10-CM

## 2021-08-25 DIAGNOSIS — E119 Type 2 diabetes mellitus without complications: Secondary | ICD-10-CM

## 2021-08-25 DIAGNOSIS — I700 Atherosclerosis of aorta: Secondary | ICD-10-CM

## 2021-08-25 NOTE — Telephone Encounter (Signed)
  Care Management   Follow Up Note   08/25/2021 Name: Allison Franco MRN: [id number] DOB: [birth year]   Referred by: [name], FNP Reason for referral : Chronic Care Management (RN CM Follow up call)   An unsuccessful telephone outreach was attempted today. The patient was referred to the case management team for assistance with care management and care coordination.   Follow Up Plan: A HIPPA compliant phone message was left for the patient providing contact information and requesting a return call.   Barb Merino, RN, BSN, CCM Care Management Coordinator [place] [facility]  Direct Phone: [phone]

## 2021-08-26 ENCOUNTER — Telehealth: Payer: Self-pay

## 2021-08-26 NOTE — Progress Notes (Addendum)
Chronic Care Management Pharmacy Assistant   Name: Allison Franco  MRN: [id number] DOB: 1941-09-21    Reason for Encounter: Disease State/ Hypertension  Recent office visits:  08-23-2021 [name], [place]. Shingrix given. START trazodone 50 mg nightly. Referral placed to ophthalmology. A1C= 5.7. Trig= 153, LDL= 103  Recent consult visits:  None  Hospital visits:  Medication Reconciliation was completed by comparing discharge summary, patient's EMR and Pharmacy list, and upon discussion with patient.  Admitted to the hospital on 07-26-2021 due to fatigue. Discharge date was 07-26-2021. Discharged from [facility] urgent care at [facility].  New?Medications Started at [facility] Discharge:?? None  Medication Changes at Hospital Discharge: None  Medications Discontinued at Hospital Discharge: Voltaren gel patient reported not taking.  Medications that remain the same after Hospital Discharge:??  -All other medications will remain the same.    Hospital visits:  Medication Reconciliation was completed by comparing discharge summary, patient's EMR and Pharmacy list, and upon discussion with patient.   Admitted to the hospital on 05-23-2021 due to shortness of breath. Discharge date was 05-23-2021. Discharged from [place]?Medications Started at [facility] Discharge:?? None   Medication Changes at Hospital Discharge: None   Medications Discontinued at Hospital Discharge: None   Medications that remain the same after Hospital Discharge:??  -All other medications will remain the same.    Medications: Outpatient Encounter Medications as of 08/26/2021  Medication Sig Note   ACCU-CHEK GUIDE test strip TEST 3 TIMES DAILY    acetaminophen (TYLENOL) 500 MG tablet Take 1 tablet (500 mg total) by mouth every 6 (six) hours as needed for mild pain, moderate pain or headache. Reported on 05/10/2016    Alcohol Swabs (ALCOHOL WIPES) 70 % PADS by Does not apply route.  Use with blood sugar check and injection of insulin    blood glucose meter kit and supplies KIT Dispense based on patient and insurance preference. Use up to four times daily as directed. (FOR ICD-9 250.00, 250.01).    Cholecalciferol (VITAMIN D3) 25 MCG (1000 UT) CAPS Take 1 capsule (1,000 Units total) by mouth daily.    clonazePAM (KLONOPIN) 0.5 MG tablet Take 1 tablet (0.5 mg total) by mouth daily as needed for anxiety.    cloNIDine (CATAPRES) 0.1 MG tablet Take 1 tablet by mouth daily if systolic blood pressure over 160. If remains elevated call to office    COVID-19 mRNA vaccine, Pfizer, 30 MCG/0.3ML injection USE AS DIRECTED    latanoprost (XALATAN) 0.005 % ophthalmic solution PLACE 1 DROP EACH EYE ONCE A DAY 05/23/2021: Pt states not taking due to side effects   metFORMIN (GLUCOPHAGE) 500 MG tablet Take 1 tablet (500 mg total) by mouth 2 (two) times daily with a meal.    Multiple Vitamins-Minerals (CENTRUM SILVER 50+WOMEN) TABS Take 1 tablet by mouth daily.    olmesartan (BENICAR) 20 MG tablet TAKE 1 TABLET (20 MG TOTAL) BY MOUTH DAILY.    Tdap (BOOSTRIX) 5-2.5-18.5 LF-MCG/0.5 injection INJECT 0.5 MLS INTO THE MUSCLE ONCE FOR 1 DOSE.    traZODone (DESYREL) 50 MG tablet Take 1 tablet (50 mg total) by mouth at bedtime.    Zoster Vaccine Adjuvanted [facility]) injection Inject 0.5 mLs into the muscle once for 1 dose. Administer 2nd dose in 2-6 months  Please fax when each dose administered [phone]    No facility-administered encounter medications on file as of 08/26/2021.   Reviewed chart prior to disease state call. Spoke with patient regarding BP  Recent Office Vitals: BP Readings from Last 3 Encounters:  08/23/21 128/62  07/26/21 (!) 168/85  05/23/21 (!) 158/73   Pulse Readings from Last 3 Encounters:  08/23/21 66  07/26/21 69  05/23/21 88    Wt Readings from Last 3 Encounters:  08/23/21 182 lb 3.2 oz (82.6 kg)  04/19/21 183 lb 12.8 oz (83.4 kg)  02/24/21 180 lb (81.6 kg)      Kidney Function Lab Results  Component Value Date/Time   CREATININE 0.74 07/26/2021 04:10 PM   CREATININE 0.86 04/19/2021 09:59 AM   GFRNONAA >60 07/26/2021 04:10 PM   GFRAA 78 11/11/2020 12:32 PM    BMP Latest Ref Rng & Units 07/26/2021 04/19/2021 11/11/2020  Glucose 70 - 99 mg/dL 99 123(H) 103(H)  BUN 8 - 23 mg/dL _0 Creatinine 0.44 - 1.00 mg/dL 0.74 0.86 0.83  BUN/Creat Ratio 12 - 28 - 20 19  Sodium 135 - 145 mmol/L 136 137 139  Potassium 3.5 - 5.1 mmol/L 4.6 4.8 4.6  Chloride 98 - 111 mmol/L 101 99 102  CO2 22 - 32 mmol/L _1 Calcium 8.9 - 10.3 mg/dL 9.3 9.1 8.9    Current antihypertensive regimen:  Clonidine 0.1 mg as needed for systolic BP greater than 234 Olmesartan 20 mg daily  How often are you checking your Blood Pressure? daily  Current home BP readings: 128/62  What recent interventions/DTPs have been made by any provider to improve Blood Pressure control since last CPP Visit:  Educated on Importance of home blood pressure monitoring; Proper BP monitoring technique; Counseled to monitor BP at home 5 days, document, and provide log at future appointments  Any recent hospitalizations or ED visits since last visit with CPP? Yes  What diet changes have been made to improve Blood Pressure Control?  Patient states she is limiting her salt intake, eating plenty of vegetables/fruits and drinking water.  What exercise is being done to improve your Blood Pressure Control?  Patient states she continues to  walk daily.  Adherence Review: Is the patient currently on ACE/ARB medication? Yes Does the patient have >5 day gap between last estimated fill dates? No  Care Gaps: Dexa scan overdue Covid booster overdue Yearly Ophthalmology overdue RAF= 1.002% Last medicare wellness 02-24-2021 Last BP 08-23-2021 128/62 Last A1C 08-23-2021 5.7 Updated Gap and med adherence data   Star Rating Drugs: Olmesartan 20 mg- Last filed 06-02-2021 [facility] Metformin 500 mg- Last filled 06-02-2021 [facility]  [place] Clinical Pharmacist Assistant [phone]

## 2021-08-27 DIAGNOSIS — I10 Essential (primary) hypertension: Secondary | ICD-10-CM

## 2021-08-27 DIAGNOSIS — E119 Type 2 diabetes mellitus without complications: Secondary | ICD-10-CM

## 2021-08-30 NOTE — Patient Instructions (Signed)
Visit Information  PATIENT GOALS:  Goals Addressed      Aortic Atherosclerosis disease progression prevented or minimized   On track    Timeframe:  Long-Range Goal Priority:  High Start Date: 08/25/21                            Expected End Date:  08/25/22  Follow up date: 10/26/21      Patient Goals: - adhere to dieteray and exercise recommendations                       Cope with Pain-Osteoarthritis   On track    Timeframe:  Long-Range Goal Priority:  High Start Date:  11/10/20                          Expected End Date: 11/10/21               Follow Up Date: 10/26/21   - use distraction techniques - use relaxation during pain    Why is this important?   Living with joint pain and enjoying your life may be hard.  Feelings like depression or anger can make your pain worse.  Learning ways to cope may help you find some relief from the pain.    Notes:      Monitor and Manage My Blood Sugar-Diabetes Type 2   On track    Timeframe:  Long-Range Goal Priority:  High Start Date: 11/10/20                            Expected End Date: 11/10/21                     Follow Up Date: 10/26/21   - Self administers oral medications as prescribed - Attends all scheduled provider appointments - Checks blood sugars as prescribed and utilize hyper and hypoglycemia protocol as needed - Adheres to prescribed ADA/carb modified   Why is this important?   Checking your blood sugar at home helps to keep it from getting very high or very low.  Writing the results in a diary or log helps the doctor know how to care for you.  Your blood sugar log should have the time, date and the results.  Also, write down the amount of insulin or other medicine that you take.  Other information, like what you ate, exercise done and how you were feeling, will also be helpful.     Notes:                 Track and Manage My Blood Pressure-Hypertension   On track    Timeframe:  Long-Range Goal Priority:   High Start Date: 11/10/20                            Expected End Date: 11/10/21                    Follow Up Date: 10/26/21   - check blood pressure 3 times per week - write blood pressure results in a log or diary  - Self administers medications as prescribed - Attends all scheduled provider appointments - Calls provider office for new concerns, questions, or BP outside discussed parameters - Follows a low sodium diet/DASH diet  Why is this important?   You won't feel high blood pressure, but it can still hurt your blood vessels.  High blood pressure can cause heart or kidney problems. It can also cause a stroke.  Making lifestyle changes like losing a  weight or eating less salt will help.  Checking your blood pressure at home and at different times of the day can help to control blood pressure.  If the doctor prescribes medicine remember to take it the way the doctor ordered.  Call the office if you cannot afford the medicine or if there are questions about it.     Notes:      The patient verbalized understanding of instructions, educational materials, and care plan provided today and declined offer to receive copy of patient instructions, educational materials, and care plan.   Telephone follow up appointment with care management team member scheduled for: 10/26/21  Barb Merino, RN, BSN, CCM Care Management Coordinator [place] [facility]  Direct Phone: [phone]

## 2021-08-30 NOTE — Chronic Care Management (AMB) (Signed)
Chronic Care Management   CCM RN Visit Note  08/25/2021 Name: Allison Franco MRN: [id number] DOB: 02-28-1941  Subjective: Allison Franco is a 80 y.o. year old female who is a primary care patient of [name], [place]. The care management team was consulted for assistance with disease management and care coordination needs.    Engaged with patient by telephone for follow up visit in response to provider referral for case management and/or care coordination services.   Consent to Services:  The patient was given information about Chronic Care Management services, agreed to services, and gave verbal consent prior to initiation of services.  Please see initial visit note for detailed documentation.   Patient agreed to services and verbal consent obtained.   Assessment: Review of patient past medical history, allergies, medications, health status, including review of consultants reports, laboratory and other test data, was performed as part of comprehensive evaluation and provision of chronic care management services.   SDOH (Social Determinants of Health) assessments and interventions performed:  Yes, no acute challenges   CCM Care Plan  Allergies  Allergen Reactions   Aspirin Other (See Comments)    insomnia   Hydroxyzine Other (See Comments)    insomnia   Valium [Diazepam] Other (See Comments)    Insomnia, 05/10/16 pt states she took as young person- made her depressed    Outpatient Encounter Medications as of 08/25/2021  Medication Sig Note   ACCU-CHEK GUIDE test strip TEST 3 TIMES DAILY    acetaminophen (TYLENOL) 500 MG tablet Take 1 tablet (500 mg total) by mouth every 6 (six) hours as needed for mild pain, moderate pain or headache. Reported on 05/10/2016    Alcohol Swabs (ALCOHOL WIPES) 70 % PADS by Does not apply route. Use with blood sugar check and injection of insulin    blood glucose meter kit and supplies KIT Dispense based on patient and insurance preference. Use up to four  times daily as directed. (FOR ICD-9 250.00, 250.01).    Cholecalciferol (VITAMIN D3) 25 MCG (1000 UT) CAPS Take 1 capsule (1,000 Units total) by mouth daily.    clonazePAM (KLONOPIN) 0.5 MG tablet Take 1 tablet (0.5 mg total) by mouth daily as needed for anxiety.    cloNIDine (CATAPRES) 0.1 MG tablet Take 1 tablet by mouth daily if systolic blood pressure over 160. If remains elevated call to office    COVID-19 mRNA vaccine, Pfizer, 30 MCG/0.3ML injection USE AS DIRECTED    latanoprost (XALATAN) 0.005 % ophthalmic solution PLACE 1 DROP EACH EYE ONCE A DAY 05/23/2021: Pt states not taking due to side effects   metFORMIN (GLUCOPHAGE) 500 MG tablet Take 1 tablet (500 mg total) by mouth 2 (two) times daily with a meal.    Multiple Vitamins-Minerals (CENTRUM SILVER 50+WOMEN) TABS Take 1 tablet by mouth daily.    olmesartan (BENICAR) 20 MG tablet TAKE 1 TABLET (20 MG TOTAL) BY MOUTH DAILY.    Tdap (BOOSTRIX) 5-2.5-18.5 LF-MCG/0.5 injection INJECT 0.5 MLS INTO THE MUSCLE ONCE FOR 1 DOSE.    traZODone (DESYREL) 50 MG tablet Take 1 tablet (50 mg total) by mouth at bedtime.    Zoster Vaccine Adjuvanted [facility]) injection Inject 0.5 mLs into the muscle once for 1 dose. Administer 2nd dose in 2-6 months  Please fax when each dose administered [phone]    No facility-administered encounter medications on file as of 08/25/2021.    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, Aortic atherosclerosis  Care  Plan : Prediabetes (Adult)  Updates made by Lynne Logan, RN since 08/25/2021 12:00 AM     Problem: Disease Progression (Prediabetes)   Priority: High     Long-Range Goal: Disease Progression Prevented or Minimized   Start Date: 11/10/2020  Expected End Date: 11/10/2021  Recent Progress: On track  Priority: High  Note:   Objective:  Lab Results  Component Value Date   HGBA1C 5.7 (H) 08/23/2021   Lab Results  Component Value Date   CREATININE 0.74 07/26/2021   CREATININE 0.86 04/19/2021   CREATININE 0.83 11/11/2020   Lab Results  Component Value Date   EGFR 69 04/19/2021   Current Barriers:  Knowledge Deficits related to basic Diabetes pathophysiology and self care/management Knowledge Deficits related to medications used for management of diabetes Limited Social Support Lacks social connections Case Manager Clinical Goal(s):  Patient will demonstrate improved adherence to prescribed treatment plan for diabetes self care/management as evidenced by:  adherence to ADA/ carb modified diet adherence to prescribed medication regimen Interventions:  08/25/21 completed successful call with patient Provided education to patient about basic DM disease process Review of patient status, including review of consultants reports, relevant laboratory and other test results, and medications completed. Reviewed medications with patient and discussed importance of medication adherence Educated patient on dietary and exercise recommendations  Educated on dietary and exercise recommendations; daily glycemic control, FBS 80-130, <180 after meals; 15'15' rule Advised patient, providing education and rationale, to check cbg daily before meals calling the CCM team and or PCP for findings outside established parameters.   Discussed plans with patient for ongoing care management follow up and provided patient with direct contact information for care management team Patient Goals/Self-Care  Activities Self administers oral medications as prescribed Attends all scheduled provider appointments Checks blood sugars as prescribed and utilize hyper and hypoglycemia protocol as needed Adheres to prescribed ADA/carb modified  Follow Up Plan: Telephone follow up appointment with care management team member scheduled for: 10/26/21    Care Plan : Hypertension (Adult)  Updates made by Lynne Logan, RN since 08/25/2021 12:00 AM     Problem: Disease Progression (Hypertension)   Priority: High     Long-Range Goal: Disease Progression Prevented or Minimized   Start Date: 11/10/2020  Expected End Date: 11/10/2021  Recent Progress: On track  Priority: High  Note:   Objective:  Objective:  Last practice recorded BP readings:  BP Readings from Last 3 Encounters:  08/23/21 128/62  07/26/21 (!) 168/85  05/23/21 (!) 158/73   Most recent eGFR/CrCl:  Lab Results  Component Value Date   EGFR 69 04/19/2021    No components found for: CRCL Current Barriers:  Knowledge Deficits related to basic understanding of hypertension pathophysiology and self care management Knowledge Deficits related to understanding of medications prescribed for management of hypertension Difficulty obtaining medications Limited Social Support Lacks social connections Case Manager Clinical Goal(s):  Patient will verbalize basic understanding of hypertension disease process and self health management plan as evidenced by patient will feel more comfortable Self Checking  her BP at home at least 3 times daily and recording her readings; she will take all prescribed BP medications exactly as prescribed  Interventions:  08/25/21 completed successful outbound call with patient  Evaluation of current treatment plan related to hypertension self management and patient's adherence to plan as established by provider. Provided education to patient re: stroke prevention, s/s of heart attack and stroke, DASH diet,  complications of uncontrolled blood pressure Reviewed medications with patient and discussed importance of compliance Assessed for and promoted awareness of worsening disease or development of comorbidity.  Educated on dietary and exercise recommendations Discussed plans with patient for ongoing care management follow up and provided patient with direct contact information for care management team Patient Goals/Self-Care Activities  Self administers medications as prescribed Attends all scheduled provider appointments Calls provider office for new concerns, questions, or BP outside discussed parameters Follows a low sodium diet/DASH diet - check blood pressure 3 times per week - write blood pressure results in a log or diary  Follow Up Plan: Telephone follow up appointment with care management team member scheduled for: 10/26/21     Care Plan : Osteoarthritis (Adult)  Updates made by Lynne Logan, RN since 08/25/2021 12:00 AM     Problem: Pain (Osteoarthritis)   Priority: High     Long-Range Goal: Manage Pain   Start Date: 11/10/2020  Expected End Date: 11/10/2021  Recent Progress: On track  Priority: High  Note:   Current Barriers:  Ineffective Self Health Maintenance Lacks social connections Currently UNABLE TO independently self manage needs related to chronic health conditions.  Knowledge Deficits related to short term plan for care coordination needs and long term plans for chronic disease management needs Nurse Case Manager Clinical Goal(s):  Patient will work with care management team to address care coordination and chronic disease management needs related to Disease Management Educational Needs Care Coordination Medication Management and Education Psychosocial Support   Interventions:  08/25/21 completed successful outbound call with patient  Provided education to patient about basic disease process related to chronic pain Review of patient status, including  review of consultants reports, relevant laboratory and other test results, and medications completed. Reviewed medications with patient and discussed importance of medication adherence Assessed pain level, treatment efficacy and patient response at regular intervals using a consistent pain scale; review the effectiveness and tolerability of all treatments. Encouraged nonpharmacologic measures, such as applied heat or cold, exercise, physical or occupational therapy, orthoses, cognitive behavioral therapy, yoga, tai-chi, acupuncture, adequate sleep and weight loss. Promoted individualized plan to stay active when unable to participate in physical therapy, such as passive and active range of motion, yoga, walking, water aerobics or swimming.  Discussed plans with patient for ongoing care management follow up and provided patient with direct contact information for care management team Patient Goals/Self Care Activities:  - use distraction techniques - use relaxation during pain  Follow Up Plan: Telephone follow up appointment with care management team member scheduled for: 10/26/21       Care Plan : Aortic Atherosclerosis  Updates made by Lynne Logan, RN since 08/25/2021 12:00 AM     Problem: Aortic Atherosclerosis   Priority: High     Long-Range Goal: Aortic Atherosclerosis disease progression prevented or minimized   Start Date: 08/25/2021  Expected End Date: 08/26/2022  This Visit's Progress: On track  Note:   Lipid Panel     Component Value Date/Time   CHOL 170 08/23/2021 1120   TRIG 153 (H) 08/23/2021  1120   HDL 40 08/23/2021 1120   CHOLHDL 4.3 08/23/2021 1120   CHOLHDL 4.5 07/04/2018 0327   VLDL 25 07/04/2018 0327   LDLCALC 103 (H) 08/23/2021 1120   LABVLDL 27 08/23/2021 1120    Current Barriers:  Ineffective Self Health Maintenance in a patient with HTN, DMII, and 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, Aortic Atheroslcerosis Clinical Goal(s):  Collaboration with [name],  FNP regarding development and update of comprehensive plan of care as evidenced by provider attestation and co-signature Inter-disciplinary care team collaboration (see longitudinal plan of care) patient will work with care management team to address care coordination and chronic disease management needs related to Disease Management Educational Needs Care Coordination Medication Management and Education Medication Reconciliation Psychosocial Support   Interventions:  08/25/21 completed successful outbound call with patient  Evaluation of current treatment plan related to  Aortic Atherosclerosis with elevated Cholesterol , self-management and patient's adherence to plan as established by provider. Collaboration with [name], FNP regarding development and update of comprehensive plan of care as evidenced by provider attestation       and co-signature Inter-disciplinary care team collaboration (see longitudinal plan of care) Provided education to patient about basic disease process related to Elevated Cholesterol  Review of patient status, including review of consultant's reports, relevant laboratory and other test results, and medications completed. Reviewed medications with patient and discussed importance of medication adherence Educated on dietary and exercise recommendations Mailed printed educational materials related to Cooking to Lower Cholesterol; My Cholesterol Care Guide  Discussed plans with patient for ongoing care management follow up and provided patient with direct contact information for care management team Self Care Activities:  Self administers medications as prescribed Attends all scheduled provider appointments Calls pharmacy for medication refills Calls provider office for new concerns or questions Patient Goals: - adhere to dieteray and exercise recommendations  Follow Up Plan: Telephone follow up appointment with care management team member scheduled for:  10/26/21     Plan:Telephone follow up appointment with care management team member scheduled for:  10/26/21  Barb Merino, RN, BSN, CCM Care Management Coordinator [place] [facility]  Direct Phone: [phone]

## 2021-09-07 ENCOUNTER — Telehealth: Payer: Medicare Other

## 2021-09-09 ENCOUNTER — Other Ambulatory Visit (HOSPITAL_COMMUNITY): Payer: Self-pay

## 2021-09-13 ENCOUNTER — Other Ambulatory Visit (HOSPITAL_COMMUNITY): Payer: Self-pay

## 2021-09-13 ENCOUNTER — Telehealth: Payer: Self-pay

## 2021-09-13 NOTE — Chronic Care Management (AMB) (Signed)
09/13/2021- APPOINTMENT REMINDER   Called Allison Franco, No answer, left message of appointment on 09/14/2021 at 12:45 PM via telephone visit with [name], Pharm D. Notified to have all medications, supplements, blood pressure and/or blood sugar logs available during appointment and to return call if need to reschedule.  Pattricia Boss, [place] Pharmacist Assistant [phone]

## 2021-09-14 ENCOUNTER — Telehealth: Payer: Self-pay

## 2021-09-22 ENCOUNTER — Telehealth: Payer: Self-pay

## 2021-09-22 NOTE — Chronic Care Management (AMB) (Signed)
    Chronic Care Management Pharmacy Assistant   Name: Agueda Houpt  MRN: [id number] DOB: Nov 06, 1941   Reason for Encounter: Reschedule missed appointment    Medications: Outpatient Encounter Medications as of 09/22/2021  Medication Sig Note   ACCU-CHEK GUIDE test strip TEST 3 TIMES DAILY    acetaminophen (TYLENOL) 500 MG tablet Take 1 tablet (500 mg total) by mouth every 6 (six) hours as needed for mild pain, moderate pain or headache. Reported on 05/10/2016    Alcohol Swabs (ALCOHOL WIPES) 70 % PADS by Does not apply route. Use with blood sugar check and injection of insulin    blood glucose meter kit and supplies KIT Dispense based on patient and insurance preference. Use up to four times daily as directed. (FOR ICD-9 250.00, 250.01).    Cholecalciferol (VITAMIN D3) 25 MCG (1000 UT) CAPS Take 1 capsule (1,000 Units total) by mouth daily.    clonazePAM (KLONOPIN) 0.5 MG tablet Take 1 tablet (0.5 mg total) by mouth daily as needed for anxiety.    cloNIDine (CATAPRES) 0.1 MG tablet Take 1 tablet by mouth daily if systolic blood pressure over 160. If remains elevated call to office    COVID-19 mRNA vaccine, Pfizer, 30 MCG/0.3ML injection USE AS DIRECTED    latanoprost (XALATAN) 0.005 % ophthalmic solution PLACE 1 DROP EACH EYE ONCE A DAY 05/23/2021: Pt states not taking due to side effects   metFORMIN (GLUCOPHAGE) 500 MG tablet Take 1 tablet (500 mg total) by mouth 2 (two) times daily with a meal.    Multiple Vitamins-Minerals (CENTRUM SILVER 50+WOMEN) TABS Take 1 tablet by mouth daily.    olmesartan (BENICAR) 20 MG tablet TAKE 1 TABLET (20 MG TOTAL) BY MOUTH DAILY.    Tdap (BOOSTRIX) 5-2.5-18.5 LF-MCG/0.5 injection INJECT 0.5 MLS INTO THE MUSCLE ONCE FOR 1 DOSE.    traZODone (DESYREL) 50 MG tablet Take 1 tablet (50 mg total) by mouth at bedtime.    Zoster Vaccine Adjuvanted [facility]) injection Inject 0.5 mLs into the muscle once for 1 dose. Administer 2nd dose in 2-6 months  Please fax when  each dose administered [phone]    No facility-administered encounter medications on file as of 09/22/2021.   09-22-2021: Called patient to reschedule missed appointment with [name] CPP to December. Patient states she is up to date with all medications.   [place] Pharmacist Assistant [phone]

## 2021-09-30 ENCOUNTER — Other Ambulatory Visit: Payer: Medicare Other

## 2021-09-30 ENCOUNTER — Telehealth: Payer: Self-pay

## 2021-09-30 NOTE — Chronic Care Management (AMB) (Signed)
    Chronic Care Management Pharmacy Assistant   Name: Allison Franco  MRN: [id number] DOB: Apr 21, 1941   Reason for Encounter: Med adherence     Medications: Outpatient Encounter Medications as of 09/30/2021  Medication Sig Note   ACCU-CHEK GUIDE test strip TEST 3 TIMES DAILY    acetaminophen (TYLENOL) 500 MG tablet Take 1 tablet (500 mg total) by mouth every 6 (six) hours as needed for mild pain, moderate pain or headache. Reported on 05/10/2016    Alcohol Swabs (ALCOHOL WIPES) 70 % PADS by Does not apply route. Use with blood sugar check and injection of insulin    blood glucose meter kit and supplies KIT Dispense based on patient and insurance preference. Use up to four times daily as directed. (FOR ICD-9 250.00, 250.01).    Cholecalciferol (VITAMIN D3) 25 MCG (1000 UT) CAPS Take 1 capsule (1,000 Units total) by mouth daily.    clonazePAM (KLONOPIN) 0.5 MG tablet Take 1 tablet (0.5 mg total) by mouth daily as needed for anxiety.    cloNIDine (CATAPRES) 0.1 MG tablet Take 1 tablet by mouth daily if systolic blood pressure over 160. If remains elevated call to office    COVID-19 mRNA vaccine, Pfizer, 30 MCG/0.3ML injection USE AS DIRECTED    latanoprost (XALATAN) 0.005 % ophthalmic solution PLACE 1 DROP EACH EYE ONCE A DAY 05/23/2021: Pt states not taking due to side effects   metFORMIN (GLUCOPHAGE) 500 MG tablet Take 1 tablet (500 mg total) by mouth 2 (two) times daily with a meal.    Multiple Vitamins-Minerals (CENTRUM SILVER 50+WOMEN) TABS Take 1 tablet by mouth daily.    olmesartan (BENICAR) 20 MG tablet TAKE 1 TABLET (20 MG TOTAL) BY MOUTH DAILY.    Tdap (BOOSTRIX) 5-2.5-18.5 LF-MCG/0.5 injection INJECT 0.5 MLS INTO THE MUSCLE ONCE FOR 1 DOSE.    traZODone (DESYREL) 50 MG tablet Take 1 tablet (50 mg total) by mouth at bedtime.    Zoster Vaccine Adjuvanted [facility]) injection Inject 0.5 mLs into the muscle once for 1 dose. Administer 2nd dose in 2-6 months  Please fax when each dose  administered [phone]    No facility-administered encounter medications on file as of 09/30/2021.    09-30-2021: 1st attempt for medication adherence on Metformin. Left VM.  [place] Pharmacist Assistant [phone]

## 2021-10-05 ENCOUNTER — Other Ambulatory Visit: Payer: Medicare Other

## 2021-10-14 ENCOUNTER — Other Ambulatory Visit (HOSPITAL_COMMUNITY): Payer: Self-pay

## 2021-10-26 ENCOUNTER — Telehealth: Payer: Medicare Other

## 2021-10-26 ENCOUNTER — Ambulatory Visit (INDEPENDENT_AMBULATORY_CARE_PROVIDER_SITE_OTHER): Payer: Medicare Other

## 2021-10-26 DIAGNOSIS — I10 Essential (primary) hypertension: Secondary | ICD-10-CM

## 2021-10-26 DIAGNOSIS — I700 Atherosclerosis of aorta: Secondary | ICD-10-CM

## 2021-10-26 DIAGNOSIS — E119 Type 2 diabetes mellitus without complications: Secondary | ICD-10-CM

## 2021-10-26 DIAGNOSIS — G8929 Other chronic pain: Secondary | ICD-10-CM

## 2021-10-26 DIAGNOSIS — M25562 Pain in left knee: Secondary | ICD-10-CM

## 2021-10-26 NOTE — Patient Instructions (Signed)
Visit Information   Thank you for taking time to visit with me today. Please don't hesitate to contact me if I can be of assistance to you before our next scheduled telephone appointment.  Following are the goals we discussed today:  (Copy and paste patient goals from clinical care plan here)  Our next appointment is by telephone on 01/26/22 at 10:20 AM  Please call the care guide team at [phone] if you need to cancel or reschedule your appointment.   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, Aortic atherosclerosis)   Start Date: 10/26/2021  Expected End Date: 10/26/2022  This Visit's Progress: On track  Priority: High  Note:   Current Barriers:  Knowledge Deficits related to plan of care for management of DM II, HTN, 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, Aortic atherosclerosis  Chronic Disease Management support and education needs related to DM II, HTN, 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, Aortic atherosclerosis   RNCM Clinical Goal(s):  Patient will verbalize basic understanding of  DM II, HTN, 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, Aortic atherosclerosis disease process and self health management plan as evidenced by patient will report having no disease exacerbations related to chronic disease states listed above take all medications exactly as prescribed and will call provider for medication related questions as evidenced by patient will report having no missed doses of her prescribed medications demonstrate Ongoing health management independence as evidenced by patient will remain to be independent with self care continue to work  with RN Care Manager to address care management and care coordination needs related to  DM II, HTN, 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   Interventions: 1:1 collaboration with primary care provider regarding development and update of comprehensive plan of care as evidenced by provider attestation and co-signature Inter-disciplinary care team collaboration (see longitudinal plan of care) Evaluation of current treatment plan related to  self management and patient's adherence to plan as established by provider   Prediabetes Interventions:  (Status:  Goal on track:  Yes.) Long Term Goal Assessed patient's understanding of A1c goal:  <5.7 Educated patient on dietary and exercise recommendations  Reviewed medications with patient and discussed importance of medication adherence Counseled on importance of regular laboratory monitoring as prescribed Discussed plans with patient for ongoing care management follow up and provided patient with direct contact information for care management team Assessed social determinant of health barriers  Lab Results  Component Value Date   HGBA1C 5.7 (H) 08/23/2021   Hypertension Interventions:  (Status:  Goal on track:  Yes.) Long Term Goal Last practice recorded BP readings:  BP Readings from Last 3 Encounters:  08/23/21 128/62  07/26/21 (!) 168/85  05/23/21 (!) 158/73  Most recent eGFR/CrCl:  Lab Results  Component Value Date   EGFR 69 04/19/2021    No components found for: CRCL  Evaluation of current  treatment plan related to hypertension self management and patient's adherence to plan as established by provider Reviewed medications with patient and discussed importance of compliance Counseled on the importance of exercise goals with  target of 150 minutes per week Provided education on prescribed diet low Sodium Discussed complications of poorly controlled blood pressure such as heart disease, stroke, circulatory complications, vision complications, kidney impairment, sexual dysfunction  Discussed plans with patient for ongoing care management follow up and provided patient with direct contact information for care management team  Hyperlipidemia Interventions:  (Status:  Goal on track:  Yes.) Long Term Goal Medication review performed; medication list updated in electronic medical record.  Provider established cholesterol goals reviewed Counseled on importance of regular laboratory monitoring as prescribed Reviewed importance of limiting foods high in cholesterol Reviewed exercise goals and target of 150 minutes per week Discussed plans with patient for ongoing care management follow up and provided patient with direct contact information for care management team  Pain Interventions:  (Status:  Goal on track:  Yes.) Long Term Goal Pain assessment performed Medications reviewed Reviewed provider established plan for pain management Discussed importance of adherence to all scheduled medical appointments Counseled on the importance of reporting any/all new or changed pain symptoms or management strategies to pain management provider Advised patient to report to care team affect of pain on daily activities Discussed plans with patient for ongoing care management follow up and provided patient with direct contact information for care management team  Patient Goals/Self-Care Activities: Take all medications as prescribed Attend all scheduled provider appointments Call pharmacy for medication refills 3-7 days in advance of running out of medications Attend church or other social activities Perform all self care activities independently  Perform IADL's (shopping, preparing meals, housekeeping, managing finances)  independently Call provider office for new concerns or questions  drink 6 to 8 glasses of water each day eat fish at least once per week fill half of plate with vegetables manage portion size take medications for blood pressure exactly as prescribed adhere to prescribed diet: low Sodium, low fat develop an exercise routine  Follow Up Plan:  Telephone follow up appointment with care management team member scheduled for:  01/26/22 _0 :20 AM      Consent to CCM Services: Ms. Butkiewicz was given information about Chronic Care Management services including:  CCM service includes personalized support from designated clinical staff supervised by her physician, including individualized plan of care and coordination with other care providers 24/7 contact phone numbers for assistance for urgent and routine care needs. Service will only be billed when office clinical staff spend 20 minutes or more in a month to coordinate care. Only one practitioner may furnish and bill the service in a calendar month. The patient may stop CCM services at any time (effective at the end of the month) by phone call to the office staff. The patient will be responsible for cost sharing (co-pay) of up to 20% of the service fee (after annual deductible is met).  Patient agreed to services and verbal consent obtained.   The patient verbalized understanding of instructions, educational materials, and care plan provided today and declined offer to receive copy of patient instructions, educational materials, and care plan.   Telephone follow up appointment with care management team member scheduled for: 01/26/22

## 2021-10-26 NOTE — Chronic Care Management (AMB) (Signed)
Chronic Care Management   CCM RN Visit Note  10/26/2021 Name: Allison Franco MRN: [id number] DOB: [birth year]  Subjective: Allison Franco is a 80 y.o. year old female who is a primary care patient of [name], [place]. The care management team was consulted for assistance with disease management and care coordination needs.    Engaged with patient by telephone for follow up visit in response to provider referral for case management and/or care coordination services.   Consent to Services:  The patient was given information about Chronic Care Management services, agreed to services, and gave verbal consent prior to initiation of services.  Please see initial visit note for detailed documentation.   Patient agreed to services and verbal consent obtained.   Assessment: Review of patient past medical history, allergies, medications, health status, including review of consultants reports, laboratory and other test data, was performed as part of comprehensive evaluation and provision of chronic care management services.   SDOH (Social Determinants of Health) assessments and interventions performed:  Yes, no acute challenges  CCM Care Plan  Allergies  Allergen Reactions   Aspirin Other (See Comments)    insomnia   Hydroxyzine Other (See Comments)    insomnia   Valium [Diazepam] Other (See Comments)    Insomnia, 05/10/16 pt states she took as young person- made her depressed    Outpatient Encounter Medications as of 10/26/2021  Medication Sig Note   ACCU-CHEK GUIDE test strip TEST 3 TIMES DAILY    acetaminophen (TYLENOL) 500 MG tablet Take 1 tablet (500 mg total) by mouth every 6 (six) hours as needed for mild pain, moderate pain or headache. Reported on 05/10/2016    Alcohol Swabs (ALCOHOL WIPES) 70 % PADS by Does not apply route. Use with blood sugar check and injection of insulin    blood glucose meter kit and supplies KIT Dispense based on patient and insurance preference. Use up to four  times daily as directed. (FOR ICD-9 250.00, 250.01).    Cholecalciferol (VITAMIN D3) 25 MCG (1000 UT) CAPS Take 1 capsule (1,000 Units total) by mouth daily.    clonazePAM (KLONOPIN) 0.5 MG tablet Take 1 tablet (0.5 mg total) by mouth daily as needed for anxiety.    cloNIDine (CATAPRES) 0.1 MG tablet Take 1 tablet by mouth daily if systolic blood pressure over 160. If remains elevated call to office    COVID-19 mRNA vaccine, Pfizer, 30 MCG/0.3ML injection USE AS DIRECTED    latanoprost (XALATAN) 0.005 % ophthalmic solution PLACE 1 DROP EACH EYE ONCE A DAY 05/23/2021: Pt states not taking due to side effects   metFORMIN (GLUCOPHAGE) 500 MG tablet Take 1 tablet (500 mg total) by mouth 2 (two) times daily with a meal.    Multiple Vitamins-Minerals (CENTRUM SILVER 50+WOMEN) TABS Take 1 tablet by mouth daily.    olmesartan (BENICAR) 20 MG tablet TAKE 1 TABLET (20 MG TOTAL) BY MOUTH DAILY.    Tdap (BOOSTRIX) 5-2.5-18.5 LF-MCG/0.5 injection INJECT 0.5 MLS INTO THE MUSCLE ONCE FOR 1 DOSE.    traZODone (DESYREL) 50 MG tablet Take 1 tablet (50 mg total) by mouth at bedtime.    Zoster Vaccine Adjuvanted [facility]) injection Inject 0.5 mLs into the muscle once for 1 dose. Administer 2nd dose in 2-6 months  Please fax when each dose administered [phone]    No facility-administered encounter medications on file as of 10/26/2021.    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, Aortic atherosclerosis  Care  Plan : RN Care Manager Plan of Care  Updates made by Lynne Logan, RN since 10/26/2021 12:00 AM     Problem: No plan of care established for chronic disease states (DM II, HTN, 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, Aortic atherosclerosis)   Priority: High     Long-Range Goal: Development of plan of care for chronic disease management (DM II, HTN, 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, Aortic atherosclerosis)   Start Date: 10/26/2021  Expected End Date: 10/26/2022  This Visit's Progress: On track  Priority: High  Note:   Current Barriers:  Knowledge Deficits related to plan of care for management of DM II, HTN, 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, Aortic atherosclerosis  Chronic Disease Management support and education needs related to DM II, HTN, 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, Aortic atherosclerosis   RNCM Clinical Goal(s):  Patient will verbalize basic understanding of  DM II, HTN, 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, Aortic atherosclerosis disease process and self health management plan as evidenced by patient will report having no disease exacerbations related to chronic disease states listed above take all medications exactly as prescribed and will call provider for medication related questions as evidenced by patient will report having no missed doses of her prescribed medications demonstrate Ongoing health management independence as evidenced by patient will remain to be independent with self care continue to work with RN Care Manager to address care management and care coordination needs related to  DM II, HTN, 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   Interventions: 1:1 collaboration with primary care provider regarding development and update of comprehensive plan of care as evidenced by provider attestation  and co-signature Inter-disciplinary care team collaboration (see longitudinal plan of care) Evaluation of current treatment plan related to  self management and patient's adherence to plan as established by provider   Prediabetes Interventions:  (Status:  Goal on track:  Yes.) Long Term Goal Assessed patient's understanding of A1c goal:  <5.7 Educated patient on dietary and exercise recommendations  Reviewed medications with patient and discussed importance of medication adherence Counseled on importance of regular laboratory monitoring as prescribed Discussed plans with patient for ongoing care management follow up and provided patient with direct contact information for care management team Assessed social determinant of health barriers  Lab Results  Component Value Date   HGBA1C 5.7 (H) 08/23/2021   Hypertension Interventions:  (Status:  Goal on track:  Yes.) Long Term Goal Last practice recorded BP readings:  BP Readings from Last 3 Encounters:  08/23/21 128/62  07/26/21 (!) 168/85  05/23/21 (!) 158/73  Most recent eGFR/CrCl:  Lab Results  Component Value Date   EGFR 69 04/19/2021    No components found for: CRCL  Evaluation of current treatment plan related to hypertension self management and patient's adherence to plan as established by provider Reviewed medications with patient and  discussed importance of compliance Counseled on the importance of exercise goals with target of 150 minutes per week Provided education on prescribed diet low Sodium Discussed complications of poorly controlled blood pressure such as heart disease, stroke, circulatory complications, vision complications, kidney impairment, sexual dysfunction  Discussed plans with patient for ongoing care management follow up and provided patient with direct contact information for care management team  Hyperlipidemia Interventions:  (Status:  Goal on track:  Yes.) Long Term Goal Medication review performed;  medication list updated in electronic medical record.  Provider established cholesterol goals reviewed Counseled on importance of regular laboratory monitoring as prescribed Reviewed importance of limiting foods high in cholesterol Reviewed exercise goals and target of 150 minutes per week Discussed plans with patient for ongoing care management follow up and provided patient with direct contact information for care management team  Pain Interventions:  (Status:  Goal on track:  Yes.) Long Term Goal Pain assessment performed Medications reviewed Reviewed provider established plan for pain management Discussed importance of adherence to all scheduled medical appointments Counseled on the importance of reporting any/all new or changed pain symptoms or management strategies to pain management provider Advised patient to report to care team affect of pain on daily activities Discussed plans with patient for ongoing care management follow up and provided patient with direct contact information for care management team  Patient Goals/Self-Care Activities: Take all medications as prescribed Attend all scheduled provider appointments Call pharmacy for medication refills 3-7 days in advance of running out of medications Attend church or other social activities Perform all self care activities independently  Perform IADL's (shopping, preparing meals, housekeeping, managing finances) independently Call provider office for new concerns or questions  drink 6 to 8 glasses of water each day eat fish at least once per week fill half of plate with vegetables manage portion size take medications for blood pressure exactly as prescribed adhere to prescribed diet: low Sodium, low fat develop an exercise routine  Follow Up Plan:  Telephone follow up appointment with care management team member scheduled for:  01/26/22 '@10' :20 AM     Plan:Telephone follow up appointment with care management team member  scheduled for:  01/26/22  Barb Merino, RN, BSN, CCM Care Management Coordinator [place] [facility]  Direct Phone: [phone]

## 2021-10-27 ENCOUNTER — Other Ambulatory Visit (HOSPITAL_COMMUNITY): Payer: Self-pay

## 2021-10-27 DIAGNOSIS — E119 Type 2 diabetes mellitus without complications: Secondary | ICD-10-CM

## 2021-10-27 DIAGNOSIS — I10 Essential (primary) hypertension: Secondary | ICD-10-CM | POA: Diagnosis not present

## 2021-10-28 ENCOUNTER — Ambulatory Visit: Payer: Medicare Other

## 2021-10-28 ENCOUNTER — Other Ambulatory Visit: Payer: Medicare Other

## 2021-10-28 ENCOUNTER — Other Ambulatory Visit (HOSPITAL_COMMUNITY): Payer: Self-pay

## 2021-10-28 DIAGNOSIS — H401123 Primary open-angle glaucoma, left eye, severe stage: Secondary | ICD-10-CM | POA: Diagnosis not present

## 2021-10-28 DIAGNOSIS — H353122 Nonexudative age-related macular degeneration, left eye, intermediate dry stage: Secondary | ICD-10-CM | POA: Diagnosis not present

## 2021-10-28 DIAGNOSIS — H353111 Nonexudative age-related macular degeneration, right eye, early dry stage: Secondary | ICD-10-CM | POA: Diagnosis not present

## 2021-10-28 DIAGNOSIS — H2513 Age-related nuclear cataract, bilateral: Secondary | ICD-10-CM | POA: Diagnosis not present

## 2021-10-28 DIAGNOSIS — H401111 Primary open-angle glaucoma, right eye, mild stage: Secondary | ICD-10-CM | POA: Diagnosis not present

## 2021-10-28 DIAGNOSIS — E119 Type 2 diabetes mellitus without complications: Secondary | ICD-10-CM | POA: Diagnosis not present

## 2021-10-28 DIAGNOSIS — H35373 Puckering of macula, bilateral: Secondary | ICD-10-CM | POA: Diagnosis not present

## 2021-10-28 LAB — HM DIABETES EYE EXAM

## 2021-10-28 MED ORDER — DORZOLAMIDE HCL-TIMOLOL MAL 2-0.5 % OP SOLN
1.0000 [drp] | Freq: Two times a day (BID) | OPHTHALMIC | 11 refills | Status: DC
  Filled 2021-10-28: qty 10, 50d supply, fill #0
  Filled 2021-11-12 – 2021-11-30 (×2): qty 10, 50d supply, fill #1
  Filled 2022-03-30: qty 10, 50d supply, fill #2
  Filled 2022-06-15: qty 10, 50d supply, fill #3

## 2021-10-29 ENCOUNTER — Other Ambulatory Visit (HOSPITAL_COMMUNITY): Payer: Self-pay

## 2021-11-01 ENCOUNTER — Other Ambulatory Visit (HOSPITAL_COMMUNITY): Payer: Self-pay

## 2021-11-02 ENCOUNTER — Ambulatory Visit (INDEPENDENT_AMBULATORY_CARE_PROVIDER_SITE_OTHER): Payer: Medicare Other

## 2021-11-02 DIAGNOSIS — I10 Essential (primary) hypertension: Secondary | ICD-10-CM

## 2021-11-02 DIAGNOSIS — E119 Type 2 diabetes mellitus without complications: Secondary | ICD-10-CM

## 2021-11-02 NOTE — Progress Notes (Signed)
Chronic Care Management Pharmacy Note  11/02/2021 Name:  Allison Franco MRN:  [id number] DOB:  1940/12/26  Summary: Patient reports that she is doing well. She missed multiple appointments on Thursday because she was at her eye appointment.   Recommendations/Changes made from today's visit: Recommend patient reschedule her DEXA scan.  Plan: Patient reports that she would like help rescheduling her DEXA scan that she missed. Collaborated with PCP team to reschedule patient for DEXA scan.    Subjective: Allison Franco is an 80 y.o. year old female who is a primary patient of [name], [place].  The CCM team was consulted for assistance with disease management and care coordination needs.    Engaged with patient by telephone for follow up visit in response to provider referral for pharmacy case management and/or care coordination services. Patient reports that she was unable to make her visit on last week Thursday due to her eye appointment at close to the same time. She is available to speak today.   Consent to Services:  The patient was given information about Chronic Care Management services, agreed to services, and gave verbal consent prior to initiation of services.  Please see initial visit note for detailed documentation.   Patient Care Team: [name], FNP as PCP - General (General Practice) Mayford Knife, [facility] (Pharmacist)  Recent office visits: 08/23/2021 PCP OV  Recent consult visits: None noted   Hospital visits: None in previous 6 months   Objective:  Lab Results  Component Value Date   CREATININE 0.74 07/26/2021   BUN 18 07/26/2021   GFRNONAA >60 07/26/2021   GFRAA 78 11/11/2020   NA 136 07/26/2021   K 4.6 07/26/2021   CALCIUM 9.3 07/26/2021   CO2 30 07/26/2021   GLUCOSE 99 07/26/2021    Lab Results  Component Value Date/Time   HGBA1C 5.7 (H) 08/23/2021 11:20 AM   HGBA1C 5.8 (H) 04/19/2021 09:59 AM   MICROALBUR 10 01/20/2021 12:36 PM    Last  diabetic Eye exam:  Lab Results  Component Value Date/Time   HMDIABEYEEXA No Retinopathy 07/15/2020 12:00 AM    Last diabetic Foot exam: No results found for: HMDIABFOOTEX   Lab Results  Component Value Date   CHOL 170 08/23/2021   HDL 40 08/23/2021   LDLCALC 103 (H) 08/23/2021   TRIG 153 (H) 08/23/2021   CHOLHDL 4.3 08/23/2021    Hepatic Function Latest Ref Rng & Units 04/19/2021 04/05/2020 01/15/2020  Total Protein 6.0 - 8.5 g/dL 7.2 6.5 7.0  Albumin 3.7 - 4.7 g/dL 4.4 3.4(L) 4.0  AST 0 - 40 IU/L '20 22 16  ' ALT 0 - 32 IU/L '20 20 19  ' Alk Phosphatase 44 - 121 IU/L 109 81 111  Total Bilirubin 0.0 - 1.2 mg/dL 0.4 0.7 0.2  Bilirubin, Direct 0.0 - 0.2 mg/dL - - -    Lab Results  Component Value Date/Time   TSH 1.995 07/26/2021 04:20 PM   TSH 1.298 07/12/2018 07:58 AM   FREET4 1.26 07/12/2018 08:37 AM    CBC Latest Ref Rng & Units 07/26/2021 04/05/2020 10/13/2019  WBC 4.0 - 10.5 K/uL 6.9 6.0 7.5  Hemoglobin 12.0 - 15.0 g/dL 13.0 12.7 12.6  Hematocrit 36.0 - 46.0 % 42.2 41.2 40.2  Platelets 150 - 400 K/uL 272 248 239    Lab Results  Component Value Date/Time   VD25OH 59.23 07/26/2021 04:10 PM    Clinical ASCVD: Yes  The ASCVD Risk score (Arnett DK, et al., 2019) failed to calculate  for the following reasons:   The 2019 ASCVD risk score is only valid for ages 51 to 67    Depression screen PHQ 2/9 02/24/2021 01/20/2021 01/15/2020  Decreased Interest 0 0 0  Down, Depressed, Hopeless 0 0 0  PHQ - 2 Score 0 0 0  Altered sleeping - - 3  Tired, decreased energy - - 0  Change in appetite - - 0  Feeling bad or failure about yourself  - - 0  Trouble concentrating - - 0  Moving slowly or fidgety/restless - - 0  Suicidal thoughts - - 0  PHQ-9 Score - - 3  Difficult doing work/chores - - Not difficult at all  Some recent data might be hidden     Social History   Tobacco Use  Smoking Status Never  Smokeless Tobacco Never   BP Readings from Last 3 Encounters:  08/23/21 128/62   07/26/21 (!) 168/85  05/23/21 (!) 158/73   Pulse Readings from Last 3 Encounters:  08/23/21 66  07/26/21 69  05/23/21 88   Wt Readings from Last 3 Encounters:  08/23/21 182 lb 3.2 oz (82.6 kg)  04/19/21 183 lb 12.8 oz (83.4 kg)  02/24/21 180 lb (81.6 kg)   BMI Readings from Last 3 Encounters:  08/23/21 35.11 kg/m  04/19/21 32.56 kg/m  02/24/21 31.89 kg/m    Assessment/Interventions: Review of patient past medical history, allergies, medications, health status, including review of consultants reports, laboratory and other test data, was performed as part of comprehensive evaluation and provision of chronic care management services.   SDOH:  (Social Determinants of Health) assessments and interventions performed: No  SDOH Screenings   Alcohol Screen: Not on file  Depression (PHQ2-9): Low Risk    PHQ-2 Score: 0  Financial Resource Strain: Medium Risk   Difficulty of Paying Living Expenses: Somewhat hard  Food Insecurity: No Food Insecurity   Worried About Charity fundraiser in the Last Year: Never true   Ran Out of Food in the Last Year: Never true  Housing: Not on file  Physical Activity: Inactive   Days of Exercise per Week: 0 days   Minutes of Exercise per Session: 0 min  Social Connections: Not on file  Stress: No Stress Concern Present   Feeling of Stress : Not at all  Tobacco Use: Low Risk    Smoking Tobacco Use: Never   Smokeless Tobacco Use: Never   Passive Exposure: Not on file  Transportation Needs: No Transportation Needs   Lack of Transportation (Medical): No   Lack of Transportation (Non-Medical): No    CCM Care Plan  Allergies  Allergen Reactions   Aspirin Other (See Comments)    insomnia   Hydroxyzine Other (See Comments)    insomnia   Valium [Diazepam] Other (See Comments)    Insomnia, 05/10/16 pt states she took as young person- made her depressed    Medications Reviewed Today     Reviewed by Mayford Knife, [place] (Pharmacist) on  11/02/21 at 1208  Med List Status: <None>   Medication Order Taking? Sig Documenting Provider Last Dose Status Informant  ACCU-CHEK GUIDE test strip [id number]  TEST 3 TIMES DAILY [name], FNP  Active   acetaminophen (TYLENOL) 500 MG tablet [id number] No Take 1 tablet (500 mg total) by mouth every 6 (six) hours as needed for mild pain, moderate pain or headache. Reported on 05/10/2016 Kalman Drape, PA Taking Active   Alcohol Swabs (ALCOHOL WIPES) 70 % PADS [id number] No by  Does not apply route. Use with blood sugar check and injection of insulin [provider] Taking Active Self  blood glucose meter kit and supplies KIT [id number] No Dispense based on patient and insurance preference. Use up to four times daily as directed. (FOR ICD-9 250.00, 250.01). Florencia Reasons, MD Taking Active Self  Cholecalciferol (VITAMIN D3) 25 MCG (1000 UT) CAPS [id number]  Take 1 capsule (1,000 Units total) by mouth daily. [name], FNP  Active   clonazePAM Bobbye [place]) 0.5 MG tablet [id number]  Take 1 tablet (0.5 mg total) by mouth daily as needed for anxiety. [name], FNP  Active   cloNIDine (CATAPRES) 0.1 MG tablet [id number]  Take 1 tablet by mouth daily if systolic blood pressure over 160. If remains elevated call to office [name], FNP  Active   dorzolamide-timolol (COSOPT) 22.3-6.8 MG/ML ophthalmic solution [id number]  Instill 1 drop into both eyes twice a day   Active   latanoprost (XALATAN) 0.005 % ophthalmic solution [id number] No PLACE 1 DROP EACH EYE ONCE A DAY  Taking Active            Med Note Tacey Heap, Oretha Ellis May 23, 2021  5:46 PM) Pt states not taking due to side effects  metFORMIN (GLUCOPHAGE) 500 MG tablet [id number]  Take 1 tablet (500 mg total) by mouth 2 (two) times daily with a meal. [name], FNP  Active   Multiple Vitamins-Minerals (CENTRUM SILVER 50+WOMEN) TABS [id number] No Take 1 tablet by mouth daily. [provider] Taking Active Self  olmesartan  (BENICAR) 20 MG tablet [id number]  TAKE 1 TABLET (20 MG TOTAL) BY MOUTH DAILY. [name], FNP  Active   Tdap (BOOSTRIX) 5-2.5-18.5 LF-MCG/0.5 injection [id number] No INJECT 0.5 MLS INTO THE MUSCLE ONCE FOR 1 DOSE. [name], FNP Taking Active   traZODone (DESYREL) 50 MG tablet [id number]  Take 1 tablet (50 mg total) by mouth at bedtime. [name], FNP  Active   Zoster Vaccine Adjuvanted [facility]) injection [id number]  Inject 0.5 mLs into the muscle once for 1 dose. Administer 2nd dose in 2-6 months  Please fax when each dose administered [phone] [name], FNP  Active             Patient Active Problem List   Diagnosis Date Noted   Primary open angle glaucoma (POAG) of both eyes, severe stage 07/16/2020   Inguinal hernia with bowel obstruction 08/07/2019   Incarcerated left inguinal hernia 08/07/2019   Anemia 08/31/2018   Essential hypertension 07/03/2018   Type 2 diabetes mellitus without complication, without long-term current use of insulin ([place]) 07/02/2018   Obesity 10/20/2013   ALLERGIC RHINITIS 08/11/2009   INSOMNIA 07/17/2008    Immunization History  Administered Date(s) Administered   Fluad Quad(high Dose 65+) 11/11/2020, 08/23/2021   Influenza Whole 11/18/2009   Influenza-Unspecified 09/24/2018   PFIZER(Purple Top)SARS-COV-2 Vaccination 02/14/2020, 03/10/2020, 10/28/2020   Pneumococcal Conjugate-13 01/16/2020   Pneumococcal Polysaccharide-23 02/01/2007   Td 02/01/2007   Tdap 01/09/2020   Zoster Recombinat (Shingrix) 08/25/2021    Conditions to be addressed/monitored:  Hypertension and Diabetes  Care Plan : [place]  Updates made by Mayford Knife, RPH since 11/02/2021 12:00 AM     Problem: HTN, DM II      Long-Range Goal: Disease Management   This Visit's Progress: On track  Recent Progress: On track  Note:   Current Barriers:  Does not maintain contact with provider office Does not contact provider office for  questions/concerns  Pharmacist Clinical Goal(s):  Patient will contact provider office for questions/concerns as evidenced notation of same in electronic health record through collaboration with PharmD and provider.   Interventions: 1:1 collaboration with [name], FNP regarding development and update of comprehensive plan of care as evidenced by provider attestation and co-signature Inter-disciplinary care team collaboration (see longitudinal plan of care) Comprehensive medication review performed; medication list updated in electronic medical record  Hypertension (BP goal <130/80) -Controlled -Current treatment: Olmesartan 20 mg tablet once per day  -Current home readings: 131/68, 130/77  -Current dietary habits: she is limiting the amount of salt she has in her diet.  -Current exercise habits: she reports that she is doing a lot of exercise including walking in the grocery stores for more then 30 minutes.  -Denies hypotensive/hypertensive symptoms -Educated on Daily salt intake goal < 2300 mg; -Counseled to monitor BP at home at least four times per week, document, and provide log at future appointments -Counseled on diet and exercise extensively Recommended to continue current medication  Diabetes (A1c goal <7%) -Controlled -Current medications: Metformin 500 mg tablet twice per day  -Current home glucose readings fasting glucose: 109 -Denies hypoglycemic/hyperglycemic symptoms -Current meal patterns:  breakfast: she is eating oatmeal with raisins, and a piece of fruit she does not add any sugar, sometimes she also has one egg white  lunch: salad  or sandwich with salmon   dinner: soup - with chicken, and plantain  snacks: she is not eating any snacks drinks: usually water  -Current exercise: please see hypertension  -Educated on A1c and blood sugar goals; Complications of diabetes including kidney damage, retinal damage, and cardiovascular disease; -Counseled to check  feet daily and get yearly eye exams -Recommended to continue current medication  Patient Goals/Self-Care Activities Patient will:  - take medications as prescribed as evidenced by patient report and record review  Follow Up Plan: The patient has been provided with contact information for the care management team and has been advised to call with any health related questions or concerns.       Medication Assistance: None required.  Patient affirms current coverage meets needs.  Compliance/Adherence/Medication fill history: Care Gaps: DEXA COVID-19 Booster Ophthalmology Exam - patient completed on last week Thursday  Shingrix Vaccine  Star-Rating Drugs: Metformin    Patient's preferred pharmacy is:  [facility] [address] [place] [address] Phone: [phone] Fax: [place] [address] Phone: [phone] Fax: [facility] [address] [place] [address] Phone: [phone] Fax: [phone]  [id number] - [place], [place] - [address] [place] [address] Phone: [phone] Fax: [phone]  Uses pill box? Yes Pt endorses 90% compliance  We discussed: Benefits of medication synchronization, packaging and delivery as well as enhanced pharmacist oversight with [employer]. Patient decided to: Continue current medication management strategy  Care Plan and Follow Up Patient Decision:  Patient agrees to Care Plan and Follow-up.  Plan: The patient has been provided with contact information for the care management team and has been advised to call with any health related questions or concerns.   [name], CPP, PharmD Clinical Pharmacist Practitioner [facility] [phone]

## 2021-11-02 NOTE — Progress Notes (Signed)
Error please see correct note.

## 2021-11-02 NOTE — Patient Instructions (Signed)
Visit Information It was great speaking with you today!  Please let me know if you have any questions about our visit.   Goals Addressed             This Visit's Progress    Manage My Medicine       Timeframe:  Long-Range Goal Priority:  High Start Date:                             Expected End Date:                       Follow Up Date 05/10/2022   In Progress:   - call for medicine refill 2 or 3 days before it runs out - call if I am sick and can't take my medicine - keep a list of all the medicines I take; vitamins and herbals too - learn to read medicine labels - use a pillbox to sort medicine - use an alarm clock or phone to remind me to take my medicine    Why is this important?   These steps will help you keep on track with your medicines.  Note: Please call if you have any questions          Patient Care Plan: CCM Pharmacy Care Plan     Problem Identified: HTN, DM II      Long-Range Goal: Disease Management   This Visit's Progress: On track  Recent Progress: On track  Note:   Current Barriers:  Does not maintain contact with provider office Does not contact provider office for questions/concerns  Pharmacist Clinical Goal(s):  Patient will contact provider office for questions/concerns as evidenced notation of same in electronic health record through collaboration with PharmD and provider.   Interventions: 1:1 collaboration with [name], FNP regarding development and update of comprehensive plan of care as evidenced by provider attestation and co-signature Inter-disciplinary care team collaboration (see longitudinal plan of care) Comprehensive medication review performed; medication list updated in electronic medical record  Hypertension (BP goal <130/80) -Controlled -Current treatment: Olmesartan 20 mg tablet once per day  -Current home readings: 131/68, 130/77  -Current dietary habits: she is limiting the amount of salt she has in her  diet.  -Current exercise habits: she reports that she is doing a lot of exercise including walking in the grocery stores for more then 30 minutes.  -Denies hypotensive/hypertensive symptoms -Educated on Daily salt intake goal < 2300 mg; -Counseled to monitor BP at home at least four times per week, document, and provide log at future appointments -Counseled on diet and exercise extensively Recommended to continue current medication  Diabetes (A1c goal <7%) -Controlled -Current medications: Metformin 500 mg tablet twice per day  -Current home glucose readings fasting glucose: 109 -Denies hypoglycemic/hyperglycemic symptoms -Current meal patterns:  breakfast: she is eating oatmeal with raisins, and a piece of fruit she does not add any sugar, sometimes she also has one egg white  lunch: salad  or sandwich with salmon   dinner: soup - with chicken, and plantain  snacks: she is not eating any snacks drinks: usually water  -Current exercise: please see hypertension  -Educated on A1c and blood sugar goals; Complications of diabetes including kidney damage, retinal damage, and cardiovascular disease; -Counseled to check feet daily and get yearly eye exams -Recommended to continue current medication  Patient Goals/Self-Care Activities Patient will:  - take medications as prescribed as  evidenced by patient report and record review  Follow Up Plan: The patient has been provided with contact information for the care management team and has been advised to call with any health related questions or concerns.        Patient agreed to services and verbal consent obtained.   The patient verbalized understanding of instructions, educational materials, and care plan provided today and agreed to receive a mailed copy of patient instructions, educational materials, and care plan.   [name], PharmD Clinical Pharmacist [facility] [phone]

## 2021-11-03 ENCOUNTER — Other Ambulatory Visit (HOSPITAL_COMMUNITY): Payer: Self-pay

## 2021-11-12 ENCOUNTER — Other Ambulatory Visit (HOSPITAL_COMMUNITY): Payer: Self-pay

## 2021-11-12 ENCOUNTER — Other Ambulatory Visit: Payer: Self-pay | Admitting: Nurse Practitioner

## 2021-11-15 ENCOUNTER — Other Ambulatory Visit (HOSPITAL_COMMUNITY): Payer: Self-pay

## 2021-11-15 MED ORDER — METFORMIN HCL 500 MG PO TABS
500.0000 mg | ORAL_TABLET | Freq: Two times a day (BID) | ORAL | 1 refills | Status: DC
  Filled 2021-11-15 – 2021-11-30 (×2): qty 90, 45d supply, fill #0

## 2021-11-23 ENCOUNTER — Other Ambulatory Visit (HOSPITAL_COMMUNITY): Payer: Self-pay

## 2021-11-27 DIAGNOSIS — E119 Type 2 diabetes mellitus without complications: Secondary | ICD-10-CM | POA: Diagnosis not present

## 2021-11-27 DIAGNOSIS — I10 Essential (primary) hypertension: Secondary | ICD-10-CM | POA: Diagnosis not present

## 2021-11-30 ENCOUNTER — Telehealth: Payer: Self-pay | Admitting: Nurse Practitioner

## 2021-11-30 ENCOUNTER — Other Ambulatory Visit: Payer: Self-pay | Admitting: Nurse Practitioner

## 2021-11-30 ENCOUNTER — Other Ambulatory Visit (HOSPITAL_COMMUNITY): Payer: Self-pay

## 2021-11-30 MED ORDER — OLMESARTAN MEDOXOMIL 20 MG PO TABS
20.0000 mg | ORAL_TABLET | Freq: Every day | ORAL | 1 refills | Status: DC
  Filled 2021-11-30: qty 90, 90d supply, fill #0

## 2021-11-30 NOTE — Telephone Encounter (Signed)
Left message for patient to call back and schedule Medicare Annual Wellness Visit (AWV) either virtually or in office.  Left both my jabber number [phone] and office number    Last AWV 02/24/21  calendar year  [facility]  please schedule at anytime with [facility]    This should be a 40 minute visit.

## 2021-12-02 ENCOUNTER — Other Ambulatory Visit (HOSPITAL_COMMUNITY): Payer: Self-pay

## 2021-12-06 ENCOUNTER — Other Ambulatory Visit (HOSPITAL_COMMUNITY): Payer: Self-pay

## 2021-12-08 ENCOUNTER — Other Ambulatory Visit: Payer: Self-pay

## 2021-12-08 ENCOUNTER — Encounter: Payer: Self-pay | Admitting: Nurse Practitioner

## 2021-12-08 ENCOUNTER — Ambulatory Visit (INDEPENDENT_AMBULATORY_CARE_PROVIDER_SITE_OTHER): Payer: Commercial Managed Care - HMO | Admitting: Nurse Practitioner

## 2021-12-08 ENCOUNTER — Other Ambulatory Visit (HOSPITAL_COMMUNITY): Payer: Self-pay

## 2021-12-08 VITALS — BP 130/70 | HR 82 | Temp 97.6°F | Ht 60.4 in | Wt 180.0 lb

## 2021-12-08 DIAGNOSIS — F5101 Primary insomnia: Secondary | ICD-10-CM

## 2021-12-08 DIAGNOSIS — M170 Bilateral primary osteoarthritis of knee: Secondary | ICD-10-CM | POA: Diagnosis not present

## 2021-12-08 DIAGNOSIS — E119 Type 2 diabetes mellitus without complications: Secondary | ICD-10-CM | POA: Diagnosis not present

## 2021-12-08 DIAGNOSIS — I10 Essential (primary) hypertension: Secondary | ICD-10-CM

## 2021-12-08 DIAGNOSIS — Z6834 Body mass index (BMI) 34.0-34.9, adult: Secondary | ICD-10-CM

## 2021-12-08 DIAGNOSIS — E66811 Obesity, class 1: Secondary | ICD-10-CM

## 2021-12-08 DIAGNOSIS — I700 Atherosclerosis of aorta: Secondary | ICD-10-CM

## 2021-12-08 DIAGNOSIS — E6609 Other obesity due to excess calories: Secondary | ICD-10-CM

## 2021-12-08 DIAGNOSIS — E1169 Type 2 diabetes mellitus with other specified complication: Secondary | ICD-10-CM

## 2021-12-08 LAB — BMP8+EGFR
BUN/Creatinine Ratio: 20 (ref 12–28)
BUN: 17 mg/dL (ref 8–27)
CO2: 25 mmol/L (ref 20–29)
Calcium: 9 mg/dL (ref 8.7–10.3)
Chloride: 100 mmol/L (ref 96–106)
Creatinine, Ser: 0.84 mg/dL (ref 0.57–1.00)
Glucose: 114 mg/dL — ABNORMAL HIGH (ref 70–99)
Potassium: 4.7 mmol/L (ref 3.5–5.2)
Sodium: 138 mmol/L (ref 134–144)
eGFR: 70 mL/min/{1.73_m2} (ref >59)

## 2021-12-08 LAB — LIPID PANEL
Chol/HDL Ratio: 4.1 ratio (ref 0.0–4.4)
Cholesterol, Total: 156 mg/dL (ref 100–199)
HDL: 38 mg/dL — ABNORMAL LOW (ref >39)
LDL Chol Calc (NIH): 93 mg/dL (ref 0–99)
Triglycerides: 141 mg/dL (ref 0–149)
VLDL Cholesterol Cal: 25 mg/dL (ref 5–40)

## 2021-12-08 LAB — CBC
Hematocrit: 40.2 % (ref 34.0–46.6)
Hemoglobin: 12.9 g/dL (ref 11.1–15.9)
MCH: 28.7 pg (ref 26.6–33.0)
MCHC: 32.1 g/dL (ref 31.5–35.7)
MCV: 89 fL (ref 79–97)
Platelets: 255 10*3/uL (ref 150–450)
RBC: 4.5 x10E6/uL (ref 3.77–5.28)
RDW: 11.7 % (ref 11.7–15.4)
WBC: 4.5 10*3/uL (ref 3.4–10.8)

## 2021-12-08 LAB — HEMOGLOBIN A1C
Est. average glucose Bld gHb Est-mCnc: 117 mg/dL
Hgb A1c MFr Bld: 5.7 % — ABNORMAL HIGH (ref 4.8–5.6)

## 2021-12-08 MED ORDER — TRAZODONE HCL 50 MG PO TABS
50.0000 mg | ORAL_TABLET | Freq: Every day | ORAL | 2 refills | Status: DC
  Filled 2021-12-08: qty 30, 30d supply, fill #0
  Filled 2022-01-10: qty 30, 30d supply, fill #1

## 2021-12-08 MED ORDER — OLMESARTAN MEDOXOMIL 20 MG PO TABS
20.0000 mg | ORAL_TABLET | Freq: Every day | ORAL | 1 refills | Status: DC
  Filled 2022-03-17: qty 90, 90d supply, fill #0

## 2021-12-08 NOTE — Progress Notes (Signed)
I,Tianna Badgett,acting as a Education administrator for [employer], NP.,have documented all relevant documentation on the behalf of [employer], NP,as directed by  Bary Castilla, NP while in the presence of Bary Castilla, NP.  This visit occurred during the SARS-CoV-2 public health emergency.  Safety protocols were in place, including screening questions prior to the visit, additional usage of staff PPE, and extensive cleaning of exam room while observing appropriate contact time as indicated for disinfecting solutions.  Subjective:     Patient ID: Allison Franco , female    DOB: 04/11/1941 , 81 y.o.   MRN: [id number]   Chief Complaint  Patient presents with   Diabetes    HPI  Patient presents today for a diabetes and blood pressure f/u Diet: She is eating healthy.  Exercise: She tries but her knees hurt  Medication: She is complaint with her medication.    Diabetes She presents for her follow-up diabetic visit. She has type 2 diabetes mellitus. Pertinent negatives for hypoglycemia include no dizziness or headaches. There are no diabetic associated symptoms. Pertinent negatives for diabetes include no chest pain, no polydipsia, no polyphagia and no polyuria. There are no hypoglycemic complications. Symptoms are stable. There are no diabetic complications. Risk factors for coronary artery disease include sedentary lifestyle and obesity. When asked about current treatments, none were reported. She is following a generally healthy diet. When asked about meal planning, she reported none. She has not had a previous visit with a dietitian. She rarely participates in exercise. (Blood sugar this morning was 101. ) An ACE inhibitor/angiotensin II receptor blocker is being taken. She does not see a podiatrist.Eye exam is not current.  Hypertension This is a chronic problem. The current episode started more than 1 year ago. The problem is controlled. Pertinent negatives include no chest pain,  headaches, palpitations or shortness of breath. There are no associated agents to hypertension. Past treatments include angiotensin blockers. There are no compliance problems.  There is no history of angina. There is no history of chronic renal disease.    Past Medical History:  Diagnosis Date   Anxiety    Constipation    Depression    "following husband's death"   Diabetes mellitus without complication ([place])    Hypertension    KNEE PAIN, BILATERAL 02/13/2009   Qualifier: Diagnosis of  By: Hassell Done FNP, Tori Milks     Tinnitus      Family History  Problem Relation Age of Onset   Diabetes Mother    Hypertension Father      Current Outpatient Medications:    ACCU-CHEK GUIDE test strip, TEST 3 TIMES DAILY, Disp: 100 strip, Rfl: 3   acetaminophen (TYLENOL) 500 MG tablet, Take 1 tablet (500 mg total) by mouth every 6 (six) hours as needed for mild pain, moderate pain or headache. Reported on 05/10/2016, Disp: 30 tablet, Rfl: 0   Alcohol Swabs (ALCOHOL WIPES) 70 % PADS, by Does not apply route. Use with blood sugar check and injection of insulin, Disp: , Rfl:    blood glucose meter kit and supplies KIT, Dispense based on patient and insurance preference. Use up to four times daily as directed. (FOR ICD-9 250.00, 250.01)., Disp: 1 each, Rfl: 0   Cholecalciferol (VITAMIN D3) 25 MCG (1000 UT) CAPS, Take 1 capsule (1,000 Units total) by mouth daily., Disp: 60 capsule, Rfl: 1   clonazePAM (KLONOPIN) 0.5 MG tablet, Take 1 tablet (0.5 mg total) by mouth daily as needed for anxiety., Disp: 30 tablet, Rfl: 2  cloNIDine (CATAPRES) 0.1 MG tablet, Take 1 tablet by mouth daily if systolic blood pressure over 160. If remains elevated call to office, Disp: 90 tablet, Rfl: 1   dorzolamide-timolol (COSOPT) 22.3-6.8 MG/ML ophthalmic solution, Instill 1 drop into both eyes twice a day, Disp: 10 mL, Rfl: 11   latanoprost (XALATAN) 0.005 % ophthalmic solution, PLACE 1 DROP EACH EYE ONCE A DAY, Disp: 2.5 mL, Rfl: 5    metFORMIN (GLUCOPHAGE) 500 MG tablet, Take 1 tablet (500 mg total) by mouth 2 (two) times daily with a meal., Disp: 90 tablet, Rfl: 1   Multiple Vitamins-Minerals (CENTRUM SILVER 50+WOMEN) TABS, Take 1 tablet by mouth daily., Disp: , Rfl:    olmesartan (BENICAR) 20 MG tablet, Take 1 tablet (20 mg total) by mouth daily., Disp: 90 tablet, Rfl: 1   Tdap (BOOSTRIX) 5-2.5-18.5 LF-MCG/0.5 injection, INJECT 0.5 MLS INTO THE MUSCLE ONCE FOR 1 DOSE., Disp: .5 mL, Rfl: 0   traZODone (DESYREL) 50 MG tablet, Take 1 tablet (50 mg total) by mouth at bedtime., Disp: 30 tablet, Rfl: 2   Zoster Vaccine Adjuvanted (SHINGRIX) injection, Inject 0.5 mLs into the muscle once for 1 dose. Administer 2nd dose in 2-6 months  Please fax when each dose administered [phone], Disp: 0.5 mL, Rfl: 1   Allergies  Allergen Reactions   Aspirin Other (See Comments)    insomnia   Hydroxyzine Other (See Comments)    insomnia   Valium [Diazepam] Other (See Comments)    Insomnia, 05/10/16 pt states she took as young person- made her depressed     Review of Systems  Constitutional: Negative.  Negative for chills and fever.  Respiratory: Negative.  Negative for cough, shortness of breath and wheezing.   Cardiovascular: Negative.  Negative for chest pain and palpitations.  Gastrointestinal: Negative.  Negative for constipation and diarrhea.  Endocrine: Negative for polydipsia, polyphagia and polyuria.  Musculoskeletal:  Positive for arthralgias. Negative for myalgias.       Knee pain bilateral   Neurological: Negative.  Negative for dizziness and headaches.    Today's Vitals   12/08/21 0943  BP: 130/70  Pulse: 82  Temp: 97.6 F (36.4 C)  TempSrc: Oral  Weight: 180 lb (81.6 kg)  Height: 5' 0.4" (1.534 m)   Body mass index is 34.69 kg/m.  Wt Readings from Last 3 Encounters:  12/08/21 180 lb (81.6 kg)  08/23/21 182 lb 3.2 oz (82.6 kg)  04/19/21 183 lb 12.8 oz (83.4 kg)    Objective:  Physical  Exam Constitutional:      Appearance: Normal appearance.  HENT:     Head: Normocephalic and atraumatic.  Cardiovascular:     Rate and Rhythm: Normal rate and regular rhythm.     Pulses: Normal pulses.     Heart sounds: No murmur heard. Pulmonary:     Effort: Pulmonary effort is normal. No respiratory distress.     Breath sounds: Normal breath sounds. No wheezing.  Skin:    General: Skin is warm and dry.     Capillary Refill: Capillary refill takes less than 2 seconds.  Neurological:     Mental Status: She is alert and oriented to person, place, and time.        Assessment And Plan:     1. Type 2 diabetes mellitus without complication, without long-term current use of insulin (HCC) -Chronic, stable,  -Continue meds  --Discussed with patient the importance of glycemic control and long term complications from uncontrolled diabetes. Discussed with the patient the  importance of compliance with home glucose monitoring, diet which includes decrease amount of sugary drinks and foods. Importance of exercise was also discussed with the patient. Importance of eye exams, self foot care and compliance to office visits was also discussed with the patient.  - Hemoglobin A1c  2. Essential hypertension -Chronic, stable  -BP 130/70  -Advised pt. About eating healthy diet. - BMP8+EGFR - olmesartan (BENICAR) 20 MG tablet; Take 1 tablet (20 mg total) by mouth daily.  Dispense: 90 tablet; Refill: 1 - CBC no Diff  3. Aortic atherosclerosis ([place]) --Educated patient about a diet that is low in fat and high fatty foods including dairy products. Increase in take of fish and fiber. Decrease intake of red meats and fast foods. Exercise for atleast 4-5 times a week or atleast 30-45 min. Drink a lot of water.   -chronic, stable.  - Lipid panel  4. Primary insomnia Comments: She no longer takes clonazepam .  - traZODone (DESYREL) 50 MG tablet; Take 1 tablet (50 mg total) by mouth at bedtime.  Dispense: 30  tablet; Refill: 2  5. Arthritis of both knees - Ambulatory referral to Orthopedics  6. Class 1 obesity due to excess calories with serious comorbidity and body mass index (BMI) of 34.0 to 34.9 in adult -Advised patient on a healthy diet including avoiding fast food and red meats. Increase the intake of lean meats including grilled chicken and [place].  Drink a lot of water. Decrease intake of fatty foods. Exercise for 30-45 min. 4-5 a week to decrease the risk of cardiac event.   The patient was encouraged to call or send a message through [place] for any questions or concerns.   Follow up: if symptoms persist or do not get better.   Side effects and appropriate use of all the medication(s) were discussed with the patient today. Patient advised to use the medication(s) as directed by their healthcare provider. The patient was encouraged to read, review, and understand all associated package inserts and contact our office with any questions or concerns. The patient accepts the risks of the treatment plan and had an opportunity to ask questions.   Patient was given opportunity to ask questions. Patient verbalized understanding of the plan and was able to repeat key elements of the plan. All questions were answered to their satisfaction.  Raman , DNP   I, Raman  have reviewed all documentation for this visit. The documentation on 04/08/21 for the exam, diagnosis, procedures, and orders are all accurate and complete.    IF YOU HAVE BEEN REFERRED TO A SPECIALIST, IT MAY TAKE 1-2 WEEKS TO SCHEDULE/PROCESS THE REFERRAL. IF YOU HAVE NOT HEARD FROM US/SPECIALIST IN TWO WEEKS, PLEASE GIVE [place] A CALL AT [phone].   THE PATIENT IS ENCOURAGED TO PRACTICE SOCIAL DISTANCING DUE TO THE COVID-19 PANDEMIC.

## 2021-12-08 NOTE — Patient Instructions (Signed)

## 2021-12-09 ENCOUNTER — Ambulatory Visit (INDEPENDENT_AMBULATORY_CARE_PROVIDER_SITE_OTHER): Payer: Commercial Managed Care - HMO

## 2021-12-09 VITALS — Ht 60.0 in | Wt 180.0 lb

## 2021-12-09 DIAGNOSIS — E2839 Other primary ovarian failure: Secondary | ICD-10-CM | POA: Diagnosis not present

## 2021-12-09 DIAGNOSIS — Z0000 Encounter for general adult medical examination without abnormal findings: Secondary | ICD-10-CM | POA: Diagnosis not present

## 2021-12-09 NOTE — Patient Instructions (Signed)
Allison Franco , Thank you for taking time to come for your Medicare Wellness Visit. I appreciate your ongoing commitment to your health goals. Please review the following plan we discussed and let me know if I can assist you in the future.   Screening recommendations/referrals: Colonoscopy: not required Mammogram: not required Bone Density: ordered today Recommended yearly ophthalmology/optometry visit for glaucoma screening and checkup Recommended yearly dental visit for hygiene and checkup  Vaccinations: Influenza vaccine: completed 08/23/2021 Pneumococcal vaccine: completed 01/16/2020 Tdap vaccine: completed 01/09/2020, due 01/08/2030 Shingles vaccine: completed   Covid-19: 10/28/2020, 03/10/2020, 02/14/2020  Advanced directives: copy in chart  Conditions/risks identified: none  Next appointment: Follow up in one year for your annual wellness visit    Preventive Care 81 Years and Older, Female Preventive care refers to lifestyle choices and visits with your health care provider that can promote health and wellness. What does preventive care include? A yearly physical exam. This is also called an annual well check. Dental exams once or twice a year. Routine eye exams. Ask your health care provider how often you should have your eyes checked. Personal lifestyle choices, including: Daily care of your teeth and gums. Regular physical activity. Eating a healthy diet. Avoiding tobacco and drug use. Limiting alcohol use. Practicing safe sex. Taking low-dose aspirin every day. Taking vitamin and mineral supplements as recommended by your health care provider. What happens during an annual well check? The services and screenings done by your health care provider during your annual well check will depend on your age, overall health, lifestyle risk factors, and family history of disease. Counseling  Your health care provider may ask you questions about your: Alcohol use. Tobacco use. Drug  use. Emotional well-being. Home and relationship well-being. Sexual activity. Eating habits. History of falls. Memory and ability to understand (cognition). Work and work Statistician. Reproductive health. Screening  You may have the following tests or measurements: Height, weight, and BMI. Blood pressure. Lipid and cholesterol levels. These may be checked every 5 years, or more frequently if you are over 58 years old. Skin check. Lung cancer screening. You may have this screening every year starting at age 28 if you have a 30-pack-year history of smoking and currently smoke or have quit within the past 15 years. Fecal occult blood test (FOBT) of the stool. You may have this test every year starting at age 4. Flexible sigmoidoscopy or colonoscopy. You may have a sigmoidoscopy every 5 years or a colonoscopy every 10 years starting at age 48. Hepatitis C blood test. Hepatitis B blood test. Sexually transmitted disease (STD) testing. Diabetes screening. This is done by checking your blood sugar (glucose) after you have not eaten for a while (fasting). You may have this done every 1-3 years. Bone density scan. This is done to screen for osteoporosis. You may have this done starting at age 59. Mammogram. This may be done every 1-2 years. Talk to your health care provider about how often you should have regular mammograms. Talk with your health care provider about your test results, treatment options, and if necessary, the need for more tests. Vaccines  Your health care provider may recommend certain vaccines, such as: Influenza vaccine. This is recommended every year. Tetanus, diphtheria, and acellular pertussis (Tdap, Td) vaccine. You may need a Td booster every 10 years. Zoster vaccine. You may need this after age 23. Pneumococcal 13-valent conjugate (PCV13) vaccine. One dose is recommended after age 68. Pneumococcal polysaccharide (PPSV23) vaccine. One dose is recommended after age  60. Talk to your health care provider about which screenings and vaccines you need and how often you need them. This information is not intended to replace advice given to you by your health care provider. Make sure you discuss any questions you have with your health care provider. Document Released: 12/11/2015 Document Revised: 08/03/2016 Document Reviewed: 09/15/2015 Elsevier Interactive Patient Education  2017 [place] Prevention in the Home Falls can cause injuries. They can happen to people of all ages. There are many things you can do to make your home safe and to help prevent falls. What can I do on the outside of my home? Regularly fix the edges of walkways and driveways and fix any cracks. Remove anything that might make you trip as you walk through a door, such as a raised step or threshold. Trim any bushes or trees on the path to your home. Use bright outdoor lighting. Clear any walking paths of anything that might make someone trip, such as rocks or tools. Regularly check to see if handrails are loose or broken. Make sure that both sides of any steps have handrails. Any raised decks and porches should have guardrails on the edges. Have any leaves, snow, or ice cleared regularly. Use sand or salt on walking paths during winter. Clean up any spills in your garage right away. This includes oil or grease spills. What can I do in the bathroom? Use night lights. Install grab bars by the toilet and in the tub and shower. Do not use towel bars as grab bars. Use non-skid mats or decals in the tub or shower. If you need to sit down in the shower, use a plastic, non-slip stool. Keep the floor dry. Clean up any water that spills on the floor as soon as it happens. Remove soap buildup in the tub or shower regularly. Attach bath mats securely with double-sided non-slip rug tape. Do not have throw rugs and other things on the floor that can make you trip. What can I do in the  bedroom? Use night lights. Make sure that you have a light by your bed that is easy to reach. Do not use any sheets or blankets that are too big for your bed. They should not hang down onto the floor. Have a firm chair that has side arms. You can use this for support while you get dressed. Do not have throw rugs and other things on the floor that can make you trip. What can I do in the kitchen? Clean up any spills right away. Avoid walking on wet floors. Keep items that you use a lot in easy-to-reach places. If you need to reach something above you, use a strong step stool that has a grab bar. Keep electrical cords out of the way. Do not use floor polish or wax that makes floors slippery. If you must use wax, use non-skid floor wax. Do not have throw rugs and other things on the floor that can make you trip. What can I do with my stairs? Do not leave any items on the stairs. Make sure that there are handrails on both sides of the stairs and use them. Fix handrails that are broken or loose. Make sure that handrails are as long as the stairways. Check any carpeting to make sure that it is firmly attached to the stairs. Fix any carpet that is loose or worn. Avoid having throw rugs at the top or bottom of the stairs. If you do have throw  rugs, attach them to the floor with carpet tape. Make sure that you have a light switch at the top of the stairs and the bottom of the stairs. If you do not have them, ask someone to add them for you. What else can I do to help prevent falls? Wear shoes that: Do not have high heels. Have rubber bottoms. Are comfortable and fit you well. Are closed at the toe. Do not wear sandals. If you use a stepladder: Make sure that it is fully opened. Do not climb a closed stepladder. Make sure that both sides of the stepladder are locked into place. Ask someone to hold it for you, if possible. Clearly mark and make sure that you can see: Any grab bars or  handrails. First and last steps. Where the edge of each step is. Use tools that help you move around (mobility aids) if they are needed. These include: Canes. Walkers. Scooters. Crutches. Turn on the lights when you go into a dark area. Replace any light bulbs as soon as they burn out. Set up your furniture so you have a clear path. Avoid moving your furniture around. If any of your floors are uneven, fix them. If there are any pets around you, be aware of where they are. Review your medicines with your doctor. Some medicines can make you feel dizzy. This can increase your chance of falling. Ask your doctor what other things that you can do to help prevent falls. This information is not intended to replace advice given to you by your health care provider. Make sure you discuss any questions you have with your health care provider. Document Released: 09/10/2009 Document Revised: 04/21/2016 Document Reviewed: 12/19/2014 Elsevier Interactive Patient Education  2017 [employer].

## 2021-12-09 NOTE — Progress Notes (Signed)
I connected with Modene Andy today by telephone and verified that I am speaking with the correct person using two identifiers. Location patient: home Location provider: work Persons participating in the virtual visit: [place], Kropp LPN.   I discussed the limitations, risks, security and privacy concerns of performing an evaluation and management service by telephone and the availability of in person appointments. I also discussed with the patient that there may be a patient responsible charge related to this service. The patient expressed understanding and verbally consented to this telephonic visit.    Interactive audio and video telecommunications were attempted between this provider and patient, however failed, due to patient having technical difficulties OR patient did not have access to video capability.  We continued and completed visit with audio only.     Vital signs may be patient reported or missing.  Subjective:   Allison Franco is a 81 y.o. female who presents for Medicare Annual (Subsequent) preventive examination.  Review of Systems     Cardiac Risk Factors include: advanced age (>52mn, >>57women);diabetes mellitus;hypertension;obesity (BMI >30kg/m2)     Objective:    Today's Vitals   12/09/21 1029 12/09/21 1030  Weight: 180 lb (81.6 kg)   Height: 5' (1.524 m)   PainSc:  7    Body mass index is 35.15 kg/m.  Advanced Directives 12/09/2021 02/24/2021 04/05/2020 01/15/2020 10/13/2019 08/07/2019 08/07/2019  Does Patient Have a Medical Advance Directive? Yes Yes Yes Yes Yes No No  Type of Advance Directive Out of facility DNR (pink MOST or yellow form) Out of facility DNR (pink MOST or yellow form) - [place] will [place]- -  Does patient want to make changes to medical advance directive? - - No - Patient declined - - - -  Copy of [place]? - - - Yes - validated most recent copy scanned in  chart (See row information) - - -  Would patient like information on creating a medical advance directive? - - - - - No - Patient declined No - Patient declined  Pre-existing out of facility DNR order (yellow form or pink MOST form) - - - - - - -    Current Medications (verified) Outpatient Encounter Medications as of 12/09/2021  Medication Sig   ACCU-CHEK GUIDE test strip TEST 3 TIMES DAILY   acetaminophen (TYLENOL) 500 MG tablet Take 1 tablet (500 mg total) by mouth every 6 (six) hours as needed for mild pain, moderate pain or headache. Reported on 05/10/2016   Alcohol Swabs (ALCOHOL WIPES) 70 % PADS by Does not apply route. Use with blood sugar check and injection of insulin   blood glucose meter kit and supplies KIT Dispense based on patient and insurance preference. Use up to four times daily as directed. (FOR ICD-9 250.00, 250.01).   Cholecalciferol (VITAMIN D3) 25 MCG (1000 UT) CAPS Take 1 capsule (1,000 Units total) by mouth daily.   cloNIDine (CATAPRES) 0.1 MG tablet Take 1 tablet by mouth daily if systolic blood pressure over 160. If remains elevated call to office   dorzolamide-timolol (COSOPT) 22.3-6.8 MG/ML ophthalmic solution Instill 1 drop into both eyes twice a day   metFORMIN (GLUCOPHAGE) 500 MG tablet Take 1 tablet (500 mg total) by mouth 2 (two) times daily with a meal.   Multiple Vitamins-Minerals (CENTRUM SILVER 50+WOMEN) TABS Take 1 tablet by mouth daily.   olmesartan (BENICAR) 20 MG tablet Take 1 tablet (20 mg total) by mouth  daily.   olmesartan (BENICAR) 20 MG tablet Take 1 tablet (20 mg total) by mouth daily.   traZODone (DESYREL) 50 MG tablet Take 1 tablet (50 mg total) by mouth at bedtime.   latanoprost (XALATAN) 0.005 % ophthalmic solution PLACE 1 DROP EACH EYE ONCE A DAY (Patient not taking: Reported on 12/09/2021)   Tdap (BOOSTRIX) 5-2.5-18.5 LF-MCG/0.5 injection INJECT 0.5 MLS INTO THE MUSCLE ONCE FOR 1 DOSE.   Zoster Vaccine Adjuvanted [facility]) injection Inject  0.5 mLs into the muscle once for 1 dose. Administer 2nd dose in 2-6 months  Please fax when each dose administered [phone]   No facility-administered encounter medications on file as of 12/09/2021.    Allergies (verified) Aspirin, Hydroxyzine, and Valium [diazepam]   History: Past Medical History:  Diagnosis Date   Anxiety    Constipation    Depression    "following husband's death"   Diabetes mellitus without complication ([place])    Hypertension    KNEE PAIN, BILATERAL 02/13/2009   Qualifier: Diagnosis of  By: Hassell Done FNP, Tori Milks     Tinnitus    Past Surgical History:  Procedure Laterality Date   ABDOMINAL HYSTERECTOMY     COLON RESECTION N/A 08/07/2019   Procedure: DIAGNOSTIC LAPAROSCOPY WITH OPEN LEFT INGUINAL HERNIA WITH MESH;  Surgeon: Johnathan Hausen, MD;  Location: [facility];  Service: General;  Laterality: N/A;   fribroid surgery     Family History  Problem Relation Age of Onset   Diabetes Mother    Hypertension Father    Social History   Socioeconomic History   Marital status: Widowed    Spouse name: Not on file   Number of children: 0   Years of education: 19   Highest education level: Not on file  Occupational History   Occupation: employed  Tobacco Use   Smoking status: Never   Smokeless tobacco: Never  Vaping Use   Vaping Use: Never used  Substance and Sexual Activity   Alcohol use: No   Drug use: No   Sexual activity: Not Currently  Other Topics Concern   Not on file  Social History Narrative   Lives alone, husband passed away 2 1/2 years ago   Caffeine use- none   Social Determinants of Health   Financial Resource Strain: Low Risk    Difficulty of Paying Living Expenses: Not hard at all  Food Insecurity: No Food Insecurity   Worried About Charity fundraiser in the Last Year: Never true   [place] in the Last Year: Never true  Transportation Needs: No Transportation Needs   Lack of Transportation (Medical): No   Lack of  Transportation (Non-Medical): No  Physical Activity: Sufficiently Active   Days of Exercise per Week: 7 days   Minutes of Exercise per Session: 30 min  Stress: No Stress Concern Present   Feeling of Stress : Not at all  Social Connections: Not on file    Tobacco Counseling Counseling given: Not Answered   Clinical Intake:  Pre-visit preparation completed: Yes  Pain : 0-10 Pain Score: 7  Pain Type: Chronic pain Pain Location: Knee Pain Orientation: Left, Right Pain Descriptors / Indicators: Aching Pain Onset: More than a month ago Pain Frequency: Intermittent     Nutritional Status: BMI > 30  Obese Nutritional Risks: None Diabetes: Yes  How often do you need to have someone help you when you read instructions, pamphlets, or other written materials from your doctor or pharmacy?: 1 - Never What is  the last grade level you completed in school?: college  Diabetic? Yes Nutrition Risk Assessment:  Has the patient had any N/V/D within the last 2 months?  No  Does the patient have any non-healing wounds?  No  Has the patient had any unintentional weight loss or weight gain?  No   Diabetes:  Is the patient diabetic?  Yes  If diabetic, was a CBG obtained today?  No  Did the patient bring in their glucometer from home?  No  How often do you monitor your CBG's? daily.   Financial Strains and Diabetes Management:  Are you having any financial strains with the device, your supplies or your medication? No .  Does the patient want to be seen by Chronic Care Management for management of their diabetes?  No  Would the patient like to be referred to a Nutritionist or for Diabetic Management?  No   Diabetic Exams:  Diabetic Eye Exam: Overdue for diabetic eye exam. Pt has been advised about the importance in completing this exam. Patient advised to call and schedule an eye exam. Diabetic Foot Exam: Completed 08/23/2021   Interpreter Needed?: No  Information entered by ::  NAllen LPN   Activities of Daily Living In your present state of health, do you have any difficulty performing the following activities: 12/09/2021 02/24/2021  Hearing? N N  Vision? N N  Difficulty concentrating or making decisions? N N  Walking or climbing stairs? N N  Dressing or bathing? N N  Doing errands, shopping? N N  Preparing Food and eating ? N N  Using the Toilet? N N  In the past six months, have you accidently leaked urine? N N  Do you have problems with loss of bowel control? N N  Managing your Medications? N N  Managing your Finances? N N  Housekeeping or managing your Housekeeping? N N  Some recent data might be hidden    Patient Care Team: [name], FNP as PCP - General (General Practice) Mayford Knife, [facility] (Pharmacist)  Indicate any recent Medical Services you may have received from other than [facility] providers in the past year (date may be approximate).     Assessment:   This is a routine wellness examination for [place].  Hearing/Vision screen No results found.  Dietary issues and exercise activities discussed: Current Exercise Habits: Home exercise routine, Type of exercise: stretching, Time (Minutes): 30, Frequency (Times/Week): 7, Weekly Exercise (Minutes/Week): 210   Goals Addressed             This Visit's Progress    Patient Stated       12/09/2021, get better with exercise       Depression Screen PHQ 2/9 Scores 12/09/2021 02/24/2021 01/20/2021 01/15/2020 05/09/2019 04/11/2019 01/10/2019  PHQ - 2 Score 0 0 0 0 0 0 0  PHQ- 9 Score - - - 3 - - -    Fall Risk Fall Risk  12/09/2021 02/24/2021 01/20/2021 01/15/2020 05/09/2019  Falls in the past year? 0 0 0 0 0  Comment - - - - -  Risk for fall due to : Medication side effect Medication side effect - Medication side effect -  Follow up Falls evaluation completed;Education provided;Falls prevention discussed Falls evaluation completed;Education provided;Falls prevention discussed - Falls evaluation  completed;Education provided;Falls prevention discussed -    FALL RISK PREVENTION PERTAINING TO THE HOME:  Any stairs in or around the home? No  If so, are there any without handrails?  N/a Home free  of loose throw rugs in walkways, pet beds, electrical cords, etc? Yes  Adequate lighting in your home to reduce risk of falls? Yes   ASSISTIVE DEVICES UTILIZED TO PREVENT FALLS:  Life alert? No  Use of a cane, walker or w/c? No  Grab bars in the bathroom? Yes  Shower chair or bench in shower? No  Elevated toilet seat or a handicapped toilet? No   TIMED UP AND GO:  Was the test performed? No .      Cognitive Function:     6CIT Screen 12/09/2021 02/24/2021 01/15/2020 10/04/2018  What Year? 0 points 0 points 0 points 0 points  What month? 0 points 0 points 0 points 0 points  What time? 0 points 0 points 0 points 0 points  Count back from 20 0 points 0 points 0 points 0 points  Months in reverse 2 points 0 points 2 points 0 points  Repeat phrase 2 points 10 points 6 points 0 points  Total Score '4 10 8 ' 0    Immunizations Immunization History  Administered Date(s) Administered   Fluad Quad(high Dose 65+) 11/11/2020, 08/23/2021   Influenza Whole 11/18/2009   Influenza-Unspecified 09/24/2018   PFIZER(Purple Top)SARS-COV-2 Vaccination 02/14/2020, 03/10/2020, 10/28/2020   Pneumococcal Conjugate-13 01/16/2020   Pneumococcal Polysaccharide-23 02/01/2007   Td 02/01/2007   Tdap 01/09/2020   Zoster Recombinat (Shingrix) 08/25/2021, 11/03/2021    TDAP status: Up to date  Flu Vaccine status: Up to date  Pneumococcal vaccine status: Up to date  Covid-19 vaccine status: Completed vaccines  Qualifies for Shingles Vaccine? Yes   Zostavax completed No   Shingrix Completed?: Yes  Screening Tests Health Maintenance  Topic Date Due   DEXA SCAN  Never done   COVID-19 Vaccine (4 - Booster for Pfizer series) 12/23/2020   OPHTHALMOLOGY EXAM  07/15/2021   HEMOGLOBIN A1C  06/07/2022    FOOT EXAM  08/23/2022   TETANUS/TDAP  01/08/2030   Pneumonia Vaccine 5+ Years old  Completed   INFLUENZA VACCINE  Completed   Zoster Vaccines- Shingrix  Completed   HPV VACCINES  Aged Out    Health Maintenance  Health Maintenance Due  Topic Date Due   DEXA SCAN  Never done   COVID-19 Vaccine (4 - Booster for [place] series) 12/23/2020   OPHTHALMOLOGY EXAM  07/15/2021    Colorectal cancer screening: No longer required.   Mammogram status: No longer required due to age.  Bone Density status: Ordered today. Pt provided with contact info and advised to call to schedule appt.  Lung Cancer Screening: (Low Dose CT Chest recommended if Age 33-80 years, 30 pack-year currently smoking OR have quit w/in 15years.) does not qualify.   Lung Cancer Screening Referral: no  Additional Screening:  Hepatitis C Screening: does not qualify;   Vision Screening: Recommended annual ophthalmology exams for early detection of glaucoma and other disorders of the eye. Is the patient up to date with their annual eye exam?  Yes  Who is the provider or what is the name of the office in which the patient attends annual eye exams? [facility] If pt is not established with a provider, would they like to be referred to a provider to establish care? No .   Dental Screening: Recommended annual dental exams for proper oral hygiene  Community Resource Referral / Chronic Care Management: CRR required this visit?  No   CCM required this visit?  No      Plan:     I have personally  reviewed and noted the following in the patients chart:   Medical and social history Use of alcohol, tobacco or illicit drugs  Current medications and supplements including opioid prescriptions.  Functional ability and status Nutritional status Physical activity Advanced directives List of other physicians Hospitalizations, surgeries, and ER visits in previous 12 months Vitals Screenings to include cognitive,  depression, and falls Referrals and appointments  In addition, I have reviewed and discussed with patient certain preventive protocols, quality metrics, and best practice recommendations. A written personalized care plan for preventive services as well as general preventive health recommendations were provided to patient.     Kellie Simmering, LPN   12/20/9357   Nurse Notes: none

## 2021-12-12 ENCOUNTER — Other Ambulatory Visit: Payer: Self-pay | Admitting: Nurse Practitioner

## 2021-12-13 ENCOUNTER — Other Ambulatory Visit (HOSPITAL_COMMUNITY): Payer: Self-pay

## 2021-12-15 ENCOUNTER — Ambulatory Visit: Payer: Self-pay

## 2021-12-15 ENCOUNTER — Encounter: Payer: Self-pay | Admitting: Orthopaedic Surgery

## 2021-12-15 ENCOUNTER — Ambulatory Visit (INDEPENDENT_AMBULATORY_CARE_PROVIDER_SITE_OTHER): Payer: Medicare Other | Admitting: Orthopaedic Surgery

## 2021-12-15 ENCOUNTER — Other Ambulatory Visit: Payer: Self-pay

## 2021-12-15 VITALS — Ht 63.0 in | Wt 180.0 lb

## 2021-12-15 DIAGNOSIS — M25561 Pain in right knee: Secondary | ICD-10-CM | POA: Diagnosis not present

## 2021-12-15 DIAGNOSIS — G8929 Other chronic pain: Secondary | ICD-10-CM | POA: Diagnosis not present

## 2021-12-15 DIAGNOSIS — M25562 Pain in left knee: Secondary | ICD-10-CM | POA: Diagnosis not present

## 2021-12-15 MED ORDER — LIDOCAINE HCL 1 % IJ SOLN
4.0000 mL | INTRAMUSCULAR | Status: AC | PRN
  Administered 2021-12-15: 4 mL

## 2021-12-15 MED ORDER — METHYLPREDNISOLONE ACETATE 40 MG/ML IJ SUSP
80.0000 mg | INTRAMUSCULAR | Status: AC | PRN
  Administered 2021-12-15: 80 mg via INTRA_ARTICULAR

## 2021-12-15 NOTE — Progress Notes (Signed)
Office Visit Note   Patient: Allison Franco           Date of Birth: 07-Mar-1941           MRN: [id number] Visit Date: 12/15/2021              Requested by: Bary Castilla, NP [address] [place],   [address] PCP: [name], FNP   Assessment & Plan: Visit Diagnoses:  1. Chronic pain of both knees     Plan: Patient is an 81 year old woman with a history of bilateral knee pain that is gotten worse over the last couple years.  She denies any injuries.  She has well-controlled diabetes with a hemoglobin A1c of 5.7 and she takes metformin she states today the right knee is more symptomatic than the left knee but this can vary.  She has tried taking Tylenol which can help her.  She also rubs Vaseline on her knees which she thinks helped.  She did also mention to me that she has used Voltaren gel but was not thinking it was as helpful we discussed options.  She has advanced degenerative changes of both knees.  We suggested going forward with a steroid injection today in her right knee which is more symptomatic she will follow-up in 2 weeks for injection into the left knee.  Follow-Up Instructions: No follow-ups on file.   Orders:  Orders Placed This Encounter  Procedures   XR KNEE 3 VIEW LEFT   XR KNEE 3 VIEW RIGHT   No orders of the defined types were placed in this encounter.     Procedures: Large Joint Inj: R knee on 12/15/2021 2:18 PM Indications: pain and diagnostic evaluation Details: 25 G 1.5 in needle, anteromedial approach  Arthrogram: No  Medications: 80 mg methylPREDNISolone acetate 40 MG/ML; 4 mL lidocaine 1 % Outcome: tolerated well, no immediate complications Procedure, treatment alternatives, risks and benefits explained, specific risks discussed. Consent was given by the patient.      Clinical Data: No additional findings.   Subjective: Chief Complaint  Patient presents with   Left Knee - Pain   Right Knee - Pain  Patient  presents today for bilateral knee pain. She said that they have been hurting for 2-66months. No known injury. She states that the right knee hurts more than the left. They hurt her more while sitting down. She takes Tylenol as needed. No previous treatment for her knees.    Review of Systems  All other systems reviewed and are negative.   Objective: Vital Signs: There were no vitals taken for this visit.  Physical Exam Constitutional:      Appearance: Normal appearance.  Eyes:     Extraocular Movements: Extraocular movements intact.  Pulmonary:     Effort: Pulmonary effort is normal.  Skin:    General: Skin is warm and dry.  Neurological:     Mental Status: She is alert.  Psychiatric:        Mood and Affect: Mood normal.        Behavior: Behavior normal.    Ortho Exam Examination of her knees.  She has no effusions.  No erythema no warmth.  She does have some tenderness over the lateral patellar border right greater than left.  Some decreased patellar mobility.  Tenderness as well on the lateral greater than medial joint line she has good varus valgus stability Specialty Comments:  No specialty comments available.  Imaging: No results found.   [place]  History: Patient Active Problem List   Diagnosis Date Noted   Primary open angle glaucoma (POAG) of both eyes, severe stage 07/16/2020   Inguinal hernia with bowel obstruction 08/07/2019   Incarcerated left inguinal hernia 08/07/2019   Anemia 08/31/2018   Essential hypertension 07/03/2018   Type 2 diabetes mellitus without complication, without long-term current use of insulin ([place]) 07/02/2018   Obesity 10/20/2013   ALLERGIC RHINITIS 08/11/2009   INSOMNIA 07/17/2008   Past Medical History:  Diagnosis Date   Anxiety    Constipation    Depression    "following husband's death"   Diabetes mellitus without complication ([place])    Hypertension    KNEE PAIN, BILATERAL 02/13/2009   Qualifier: Diagnosis of  By: Hassell Done FNP,  Tori Milks     Tinnitus     Family History  Problem Relation Age of Onset   Diabetes Mother    Hypertension Father     Past Surgical History:  Procedure Laterality Date   ABDOMINAL HYSTERECTOMY     COLON RESECTION N/A 08/07/2019   Procedure: DIAGNOSTIC LAPAROSCOPY WITH OPEN LEFT INGUINAL HERNIA WITH MESH;  Surgeon: Johnathan Hausen, MD;  Location: [facility];  Service: General;  Laterality: N/A;   fribroid surgery     Social History   Occupational History   Occupation: employed  Tobacco Use   Smoking status: Never   Smokeless tobacco: Never  Vaping Use   Vaping Use: Never used  Substance and Sexual Activity   Alcohol use: No   Drug use: No   Sexual activity: Not Currently

## 2021-12-16 ENCOUNTER — Other Ambulatory Visit (HOSPITAL_COMMUNITY): Payer: Self-pay

## 2021-12-16 ENCOUNTER — Other Ambulatory Visit: Payer: Self-pay | Admitting: Nurse Practitioner

## 2021-12-16 MED ORDER — METFORMIN HCL 500 MG PO TABS
500.0000 mg | ORAL_TABLET | Freq: Two times a day (BID) | ORAL | 1 refills | Status: DC
  Filled 2021-12-16: qty 100, 50d supply, fill #0

## 2021-12-28 ENCOUNTER — Other Ambulatory Visit: Payer: Self-pay | Admitting: Nurse Practitioner

## 2021-12-28 ENCOUNTER — Other Ambulatory Visit: Payer: Self-pay

## 2021-12-28 ENCOUNTER — Ambulatory Visit
Admission: RE | Admit: 2021-12-28 | Discharge: 2021-12-28 | Disposition: A | Discharge status: Home / Self Care | Payer: Medicare Other | Source: Ambulatory Visit | Attending: Nurse Practitioner | Admitting: Nurse Practitioner

## 2021-12-28 DIAGNOSIS — Z1231 Encounter for screening mammogram for malignant neoplasm of breast: Secondary | ICD-10-CM

## 2021-12-28 DIAGNOSIS — Z780 Asymptomatic menopausal state: Secondary | ICD-10-CM | POA: Diagnosis not present

## 2021-12-28 DIAGNOSIS — E2839 Other primary ovarian failure: Secondary | ICD-10-CM

## 2022-01-03 ENCOUNTER — Other Ambulatory Visit (HOSPITAL_COMMUNITY): Payer: Self-pay

## 2022-01-06 ENCOUNTER — Other Ambulatory Visit (HOSPITAL_COMMUNITY): Payer: Self-pay

## 2022-01-10 ENCOUNTER — Other Ambulatory Visit (HOSPITAL_COMMUNITY): Payer: Self-pay

## 2022-01-11 ENCOUNTER — Other Ambulatory Visit (HOSPITAL_COMMUNITY): Payer: Self-pay

## 2022-01-11 ENCOUNTER — Other Ambulatory Visit: Payer: Self-pay

## 2022-01-11 MED ORDER — METFORMIN HCL 500 MG PO TABS
500.0000 mg | ORAL_TABLET | Freq: Two times a day (BID) | ORAL | 1 refills | Status: DC
  Filled 2022-01-11 (×2): qty 200, 100d supply, fill #0

## 2022-01-12 ENCOUNTER — Other Ambulatory Visit (HOSPITAL_COMMUNITY): Payer: Self-pay

## 2022-01-13 ENCOUNTER — Other Ambulatory Visit (HOSPITAL_COMMUNITY): Payer: Self-pay

## 2022-01-13 ENCOUNTER — Ambulatory Visit (HOSPITAL_COMMUNITY)
Admission: EM | Admit: 2022-01-13 | Discharge: 2022-01-13 | Disposition: A | Discharge status: Home / Self Care | Payer: Medicare Other

## 2022-01-13 ENCOUNTER — Other Ambulatory Visit: Payer: Self-pay

## 2022-01-13 DIAGNOSIS — F5101 Primary insomnia: Secondary | ICD-10-CM | POA: Diagnosis not present

## 2022-01-13 NOTE — Discharge Instructions (Addendum)
-  Benedryl (diphenhydramine) 25-50mg  (1-2 pills) at night for sleep.  This medication will cause drowsiness. -Follow-up with your primary care provider for further medication management.

## 2022-01-13 NOTE — ED Triage Notes (Signed)
Pt reports the medication she was prescribed here is not working. She is getting to sleepy and does not like the way it makes her feel. She was seen on 01/11/2022 by her pcp and giving metformin to start.

## 2022-01-13 NOTE — ED Provider Notes (Signed)
But states [facility]    CSN: [id number] Arrival date & time: 01/13/22  1244      History   Chief Complaint Chief Complaint  Patient presents with   Medication Reaction    HPI Allison Franco is a 81 y.o. female presenting with concerns about her medication regimen.  Medical history insomnia, diabetes.  Patient is followed by PCP, they could not see her today and she had some concerns about her medication.  States that the trazodone she was prescribed tastes more bitter than at her previous prescription, and she thinks that this is not the same medication.  She states that she has been taking it by placing it on her tongue.  States that she had 1 restless night, where she woke up multiple times throughout the night, and this has edified her theory that this is a different medication.  States she is also concerned that she was given 200 pills of metformin, when this was only supposed to be a 90-day prescription, she has not been taking the metformin due to this.  HPI  Past Medical History:  Diagnosis Date   Anxiety    Constipation    Depression    "following husband's death"   Diabetes mellitus without complication ([place])    Hypertension    KNEE PAIN, BILATERAL 02/13/2009   Qualifier: Diagnosis of  By: Hassell Done FNP, Tori Milks     Tinnitus     Patient Active Problem List   Diagnosis Date Noted   Primary open angle glaucoma (POAG) of both eyes, severe stage 07/16/2020   Inguinal hernia with bowel obstruction 08/07/2019   Incarcerated left inguinal hernia 08/07/2019   Anemia 08/31/2018   Essential hypertension 07/03/2018   Type 2 diabetes mellitus without complication, without long-term current use of insulin ([place]) 07/02/2018   Obesity 10/20/2013   ALLERGIC RHINITIS 08/11/2009   INSOMNIA 07/17/2008    Past Surgical History:  Procedure Laterality Date   ABDOMINAL HYSTERECTOMY     COLON RESECTION N/A 08/07/2019   Procedure: DIAGNOSTIC LAPAROSCOPY WITH OPEN LEFT INGUINAL  HERNIA WITH MESH;  Surgeon: Johnathan Hausen, MD;  Location: [facility];  Service: General;  Laterality: N/A;   fribroid surgery      OB History   No obstetric history on file.      Home Medications    Prior to Admission medications   Medication Sig Start Date End Date Taking? Authorizing Provider  ACCU-CHEK GUIDE test strip TEST 3 TIMES DAILY 12/13/21   [name], FNP  acetaminophen (TYLENOL) 500 MG tablet Take 1 tablet (500 mg total) by mouth every 6 (six) hours as needed for mild pain, moderate pain or headache. Reported on 05/10/2016 08/09/19   Kalman Drape, PA  Alcohol Swabs (ALCOHOL WIPES) 70 % PADS by Does not apply route. Use with blood sugar check and injection of insulin    [provider]  blood glucose meter kit and supplies KIT Dispense based on patient and insurance preference. Use up to four times daily as directed. (FOR ICD-9 250.00, 250.01). 07/05/18   Florencia Reasons, MD  Cholecalciferol (VITAMIN D3) 25 MCG (1000 UT) CAPS Take 1 capsule (1,000 Units total) by mouth daily. 06/02/21   [name], FNP  cloNIDine (CATAPRES) 0.1 MG tablet Take 1 tablet by mouth daily if systolic blood pressure over 160. If remains elevated call to office 06/02/21   [name], FNP  dorzolamide-timolol (COSOPT) 22.3-6.8 MG/ML ophthalmic solution Instill 1 drop into both eyes twice a day 10/28/21  latanoprost (XALATAN) 0.005 % ophthalmic solution PLACE 1 DROP EACH EYE ONCE A DAY 02/12/21 02/12/22    metFORMIN (GLUCOPHAGE) 500 MG tablet Take 1 tablet (500 mg total) by mouth 2 (two) times daily with a meal. 01/11/22   [name], FNP  Multiple Vitamins-Minerals (CENTRUM SILVER 50+WOMEN) TABS Take 1 tablet by mouth daily.    [provider]  olmesartan (BENICAR) 20 MG tablet Take 1 tablet (20 mg total) by mouth daily. 11/30/21   [name], FNP  olmesartan (BENICAR) 20 MG tablet Take 1 tablet (20 mg total) by mouth daily. 12/08/21   Bary Castilla, NP  Tdap (BOOSTRIX) 5-2.5-18.5  LF-MCG/0.5 injection INJECT 0.5 MLS INTO THE MUSCLE ONCE FOR 1 DOSE. 01/20/21 01/20/22  [name], FNP  traZODone (DESYREL) 50 MG tablet Take 1 tablet (50 mg total) by mouth at bedtime. 12/08/21   Bary Castilla, NP  Zoster Vaccine Adjuvanted [facility]) injection Inject 0.5 mLs into the muscle once for 1 dose. Administer 2nd dose in 2-6 months  Please fax when each dose administered [phone] 08/23/21   [name], FNP    Family History Family History  Problem Relation Age of Onset   Diabetes Mother    Hypertension Father    Breast cancer Neg Hx     Social History Social History   Tobacco Use   Smoking status: Never   Smokeless tobacco: Never  Vaping Use   Vaping Use: Never used  Substance Use Topics   Alcohol use: No   Drug use: No     Allergies   Aspirin, Hydroxyzine, and Valium [diazepam]   Review of Systems Review of Systems  Constitutional:  Negative for appetite change, chills and fever.  HENT:  Negative for congestion, ear pain, rhinorrhea, sinus pressure, sinus pain and sore throat.   Eyes:  Negative for redness and visual disturbance.  Respiratory:  Negative for cough, chest tightness, shortness of breath and wheezing.   Cardiovascular:  Negative for chest pain and palpitations.  Gastrointestinal:  Negative for abdominal pain, constipation, diarrhea, nausea and vomiting.  Genitourinary:  Negative for dysuria, frequency and urgency.  Musculoskeletal:  Negative for myalgias.  Neurological:  Negative for dizziness, weakness and headaches.  Psychiatric/Behavioral:  Negative for confusion.   All other systems reviewed and are negative.   Physical Exam Triage Vital Signs ED Triage Vitals [01/13/22 1404]  Enc Vitals Group     BP 133/81     Pulse Rate 90     Resp 16     Temp 98.3 F (36.8 C)     Temp Source Oral     SpO2 98 %     Weight      Height      Head Circumference      Peak Flow      Pain Score 0     Pain Loc      Pain Edu?       Excl. in [place]?    No data found.  Updated Vital Signs BP 133/81 (BP Location: Left Arm)    Pulse 90    Temp 98.3 F (36.8 C) (Oral)    Resp 16    SpO2 98%   Visual Acuity Right Eye Distance:   Left Eye Distance:   Bilateral Distance:    Right Eye Near:   Left Eye Near:    Bilateral Near:     Physical Exam Vitals reviewed.  Constitutional:      General: She is not in acute distress.  Appearance: Normal appearance. She is not ill-appearing or diaphoretic.  HENT:     Head: Normocephalic and atraumatic.  Cardiovascular:     Rate and Rhythm: Normal rate and regular rhythm.     Heart sounds: Normal heart sounds.  Pulmonary:     Effort: Pulmonary effort is normal.     Breath sounds: Normal breath sounds.  Skin:    General: Skin is warm.  Neurological:     General: No focal deficit present.     Mental Status: She is alert and oriented to person, place, and time.  Psychiatric:        Mood and Affect: Mood normal.        Behavior: Behavior normal.        Thought Content: Thought content normal.        Judgment: Judgment normal.     UC Treatments / Results  Labs (all labs ordered are listed, but only abnormal results are displayed) Labs Reviewed - No data to display  EKG   Radiology No results found.  Procedures Procedures (including critical care time)  Medications Ordered in UC Medications - No data to display  Initial Impression / Assessment and Plan / UC Course  I have reviewed the triage vital signs and the nursing notes.  Pertinent labs & imaging results that were available during my care of the patient were reviewed by me and considered in my medical decision making (see chart for details).     This patient is a very pleasant 81 y.o. year old female presenting with concerns about her medication regimen. Continue trazodone for sleep. Continue metformin bid for diabetes. She has enough refills of both. She is tolerating her medications well. F/u with PCP  for additional concerns.   Final Clinical Impressions(s) / UC Diagnoses   Final diagnoses:  Primary insomnia     Discharge Instructions      -Benedryl (diphenhydramine) 25-37m (1-2 pills) at night for sleep.  This medication will cause drowsiness. -Follow-up with your primary care provider for further medication management.    ED Prescriptions   None    PDMP not reviewed this encounter.   GHazel Sams PA-C 01/13/22 1436

## 2022-01-24 ENCOUNTER — Ambulatory Visit (INDEPENDENT_AMBULATORY_CARE_PROVIDER_SITE_OTHER): Payer: Medicare Other | Admitting: Nurse Practitioner

## 2022-01-24 ENCOUNTER — Encounter: Payer: Self-pay | Admitting: Nurse Practitioner

## 2022-01-24 ENCOUNTER — Other Ambulatory Visit: Payer: Self-pay

## 2022-01-24 VITALS — BP 128/68 | HR 71 | Temp 98.2°F | Ht 63.0 in | Wt 174.8 lb

## 2022-01-24 DIAGNOSIS — I10 Essential (primary) hypertension: Secondary | ICD-10-CM | POA: Diagnosis not present

## 2022-01-24 DIAGNOSIS — Z0000 Encounter for general adult medical examination without abnormal findings: Secondary | ICD-10-CM

## 2022-01-24 DIAGNOSIS — E119 Type 2 diabetes mellitus without complications: Secondary | ICD-10-CM

## 2022-01-24 DIAGNOSIS — E1169 Type 2 diabetes mellitus with other specified complication: Secondary | ICD-10-CM

## 2022-01-24 DIAGNOSIS — Z6830 Body mass index (BMI) 30.0-30.9, adult: Secondary | ICD-10-CM

## 2022-01-24 DIAGNOSIS — I700 Atherosclerosis of aorta: Secondary | ICD-10-CM

## 2022-01-24 DIAGNOSIS — F5101 Primary insomnia: Secondary | ICD-10-CM

## 2022-01-24 DIAGNOSIS — E6609 Other obesity due to excess calories: Secondary | ICD-10-CM

## 2022-01-24 LAB — POCT URINALYSIS DIPSTICK
Bilirubin, UA: NEGATIVE
Blood, UA: NEGATIVE
Glucose, UA: NEGATIVE
Ketones, UA: NEGATIVE
Leukocytes, UA: NEGATIVE
Nitrite, UA: NEGATIVE
Protein, UA: NEGATIVE
Spec Grav, UA: 1.025 (ref 1.010–1.025)
Urobilinogen, UA: 0.2 E.U./dL
pH, UA: 6 (ref 5.0–8.0)

## 2022-01-24 MED ORDER — METFORMIN HCL 500 MG PO TABS: 500.0000 mg | ORAL_TABLET | Freq: Every day | ORAL | 1 refills | Status: DC

## 2022-01-24 NOTE — Patient Instructions (Signed)

## 2022-01-24 NOTE — Progress Notes (Signed)
I,Tianna Badgett,acting as a Education administrator for [employer], FNP.,have documented all relevant documentation on the behalf of [name], FNP,as directed by  [name], FNP while in the presence of [name], [place].  This visit occurred during the SARS-CoV-2 public health emergency.  Safety protocols were in place, including screening questions prior to the visit, additional usage of staff PPE, and extensive cleaning of exam room while observing appropriate contact time as indicated for disinfecting solutions.  Subjective:     Patient ID: Allison Franco , female    DOB: [birth year] , 81 y.o.   MRN: [id number]   Chief Complaint  Patient presents with   Annual Exam    HPI  Here for HM.  She was seen in January for her diabetes/HTN. She reports she picked up her medication on last week from a different pharmacy and when she took it caused her restlessness. She is concerned the medication is not the same and the pharmacy staff is different. I tried to reassure her the medication is the same but can take to the pharmacy to have them to look at it again.     Past Medical History:  Diagnosis Date   Anxiety    Constipation    Depression    "following husband's death"   Diabetes mellitus without complication ([place])    Hypertension    KNEE PAIN, BILATERAL 02/13/2009   Qualifier: Diagnosis of  By: Hassell Done FNP, Tori Milks     Tinnitus      Family History  Problem Relation Age of Onset   Diabetes Mother    Hypertension Father    Breast cancer Neg Hx      Current Outpatient Medications:    ACCU-CHEK GUIDE test strip, TEST 3 TIMES DAILY, Disp: 100 strip, Rfl: 3   acetaminophen (TYLENOL) 500 MG tablet, Take 1 tablet (500 mg total) by mouth every 6 (six) hours as needed for mild pain, moderate pain or headache. Reported on 05/10/2016, Disp: 30 tablet, Rfl: 0   Alcohol Swabs (ALCOHOL WIPES) 70 % PADS, by Does not apply route. Use with blood sugar check and injection of insulin, Disp: , Rfl:    blood  glucose meter kit and supplies KIT, Dispense based on patient and insurance preference. Use up to four times daily as directed. (FOR ICD-9 250.00, 250.01)., Disp: 1 each, Rfl: 0   Cholecalciferol (VITAMIN D3) 25 MCG (1000 UT) CAPS, Take 1 capsule (1,000 Units total) by mouth daily., Disp: 60 capsule, Rfl: 1   cloNIDine (CATAPRES) 0.1 MG tablet, Take 1 tablet by mouth daily if systolic blood pressure over 160. If remains elevated call to office, Disp: 90 tablet, Rfl: 1   dorzolamide-timolol (COSOPT) 22.3-6.8 MG/ML ophthalmic solution, Instill 1 drop into both eyes twice a day, Disp: 10 mL, Rfl: 11   latanoprost (XALATAN) 0.005 % ophthalmic solution, PLACE 1 DROP EACH EYE ONCE A DAY, Disp: 2.5 mL, Rfl: 5   Multiple Vitamins-Minerals (CENTRUM SILVER 50+WOMEN) TABS, Take 1 tablet by mouth daily., Disp: , Rfl:    traZODone (DESYREL) 50 MG tablet, Take 1 tablet (50 mg total) by mouth at bedtime., Disp: 30 tablet, Rfl: 2   metFORMIN (GLUCOPHAGE) 500 MG tablet, Take 1 tablet (500 mg total) by mouth daily with breakfast., Disp: 90 tablet, Rfl: 1   olmesartan (BENICAR) 20 MG tablet, Take 1 tablet (20 mg total) by mouth daily., Disp: 90 tablet, Rfl: 1   Zoster Vaccine Adjuvanted (SHINGRIX) injection, Inject 0.5 mLs into the muscle once for 1 dose.  Administer 2nd dose in 2-6 months  Please fax when each dose administered [phone], Disp: 0.5 mL, Rfl: 1   Allergies  Allergen Reactions   Aspirin Other (See Comments)    insomnia   Hydroxyzine Other (See Comments)    insomnia   Valium [Diazepam] Other (See Comments)    Insomnia, 05/10/16 pt states she took as young person- made her depressed      The patient states she is status post hysterectomy.  No LMP recorded. Patient has had a hysterectomy.  Negative for: breast discharge, breast lump(s), breast pain and breast self exam. Associated symptoms include abnormal vaginal bleeding. Pertinent negatives include abnormal bleeding (hematology), anxiety,  decreased libido, depression, difficulty falling sleep, dyspareunia, history of infertility, nocturia, sexual dysfunction, sleep disturbances, urinary incontinence, urinary urgency, vaginal discharge and vaginal itching. Diet regular - she does not eat potato, sugar, she does not drink orange/apple juice.  The patient states her exercise level is moderate - reports she walks a lot. At least 2 times a week and when she goes shopping.  The patient's tobacco use is:  Social History   Tobacco Use  Smoking Status Never  Smokeless Tobacco Never   She has been exposed to passive smoke. The patient's alcohol use is:  Social History   Substance and Sexual Activity  Alcohol Use No    Review of Systems  Constitutional: Negative.   HENT: Negative.    Eyes: Negative.   Respiratory: Negative.    Cardiovascular: Negative.   Gastrointestinal: Negative.   Endocrine: Negative.   Genitourinary: Negative.   Musculoskeletal: Negative.   Skin: Negative.   Allergic/Immunologic: Negative.   Neurological: Negative.   Hematological: Negative.   Psychiatric/Behavioral: Negative.      Today's Vitals   01/24/22 0941  BP: 128/68  Pulse: 71  Temp: 98.2 F (36.8 C)  TempSrc: Oral  Weight: 174 lb 12.8 oz (79.3 kg)  Height: _0  (1.6 m)   Body mass index is 30.96 kg/m.  Wt Readings from Last 3 Encounters:  01/24/22 174 lb 12.8 oz (79.3 kg)  12/15/21 180 lb (81.6 kg)  12/09/21 180 lb (81.6 kg)    Objective:  Physical Exam Vitals reviewed.  Constitutional:      General: She is not in acute distress.    Appearance: Normal appearance. She is well-developed. She is obese.  HENT:     Head: Normocephalic and atraumatic.     Right Ear: Hearing, tympanic membrane, ear canal and external ear normal. There is no impacted cerumen.     Left Ear: Hearing, tympanic membrane, ear canal and external ear normal. There is no impacted cerumen.     Nose:     Comments: Deferred - masked    Mouth/Throat:      Comments: Deferred - masked Eyes:     General: Lids are normal.     Extraocular Movements: Extraocular movements intact.     Conjunctiva/sclera: Conjunctivae normal.     Pupils: Pupils are equal, round, and reactive to light.     Funduscopic exam:    Right eye: No papilledema.        Left eye: No papilledema.  Neck:     Thyroid: No thyroid mass.     Vascular: No carotid bruit.  Cardiovascular:     Rate and Rhythm: Normal rate and regular rhythm.     Pulses: Normal pulses.     Heart sounds: Normal heart sounds. No murmur heard. Pulmonary:     Effort: Pulmonary effort is  normal. No respiratory distress.     Breath sounds: Normal breath sounds. No wheezing.  Chest:     Chest wall: No mass.  Breasts:    Tanner Score is 5.     Right: Normal. No mass or tenderness.     Left: Normal. No mass or tenderness.  Abdominal:     General: Abdomen is flat. Bowel sounds are normal. There is no distension.     Palpations: Abdomen is soft.     Tenderness: There is no abdominal tenderness.  Genitourinary:    Rectum: Guaiac result negative.  Musculoskeletal:        General: No swelling or tenderness. Normal range of motion.     Cervical back: Full passive range of motion without pain, normal range of motion and neck supple.     Right lower leg: No edema.     Left lower leg: No edema.  Lymphadenopathy:     Upper Body:     Right upper body: No supraclavicular, axillary or pectoral adenopathy.     Left upper body: No supraclavicular, axillary or pectoral adenopathy.  Skin:    General: Skin is warm and dry.     Capillary Refill: Capillary refill takes less than 2 seconds.  Neurological:     General: No focal deficit present.     Mental Status: She is alert and oriented to person, place, and time.     Cranial Nerves: No cranial nerve deficit.     Sensory: No sensory deficit.     Motor: No weakness.  Psychiatric:        Mood and Affect: Mood normal.        Behavior: Behavior normal.         Thought Content: Thought content normal.        Judgment: Judgment normal.        Assessment And Plan:     1. Encounter for general adult medical examination w/o abnormal findings Behavior modifications discussed and diet history reviewed.   Pt will continue to exercise regularly and modify diet with low GI, plant based foods and decrease intake of processed foods.  Recommend intake of daily multivitamin, Vitamin D, and calcium.  Recommend for preventive screenings, as well as recommend immunizations that include influenza, TDAP, and Shingles (up to date), recommended to go to pharmacy for 2nd covid booster had mobile bone density  2. Type 2 diabetes mellitus without complication, without long-term current use of insulin (HCC) Comments: HgbA1c is stable, I will change her metformin to once a day as she is taking, she is not taking 2 times a day causes GI upset - EKG 12-Lead - POCT Urinalysis Dipstick (81002) - Microalbumin / Creatinine Urine Ratio - metFORMIN (GLUCOPHAGE) 500 MG tablet; Take 1 tablet (500 mg total) by mouth daily with breakfast.  Dispense: 90 tablet; Refill: 1  3. Essential hypertension Comments: Blood pressure is normal, continue taking medictions. EKG done with NSR HR 66  4. Primary insomnia Comments: Had an episode of restlessness requiring an urgent care visit, concerned about her Trazadone which is the same medication sent previously  5. Aortic atherosclerosis (HCC)  6. Class 1 obesity due to excess calories with serious comorbidity and body mass index (BMI) of 30.0 to 30.9 in adult Comments: Continue with regular exercise and eating a healthy diet She is encouraged to strive for BMI less than 30 to decrease cardiac risk. Advised to aim for at least 150 minutes of exercise per week.  Patient was given opportunity to ask questions. Patient verbalized understanding of the plan and was able to repeat key elements of the plan. All questions were answered to  their satisfaction.   [name], FNP   I, [name], FNP, have reviewed all documentation for this visit. The documentation on 01/24/22 for the exam, diagnosis, procedures, and orders are all accurate and complete.  THE PATIENT IS ENCOURAGED TO PRACTICE SOCIAL DISTANCING DUE TO THE COVID-19 PANDEMIC.

## 2022-01-25 LAB — MICROALBUMIN / CREATININE URINE RATIO
Creatinine, Urine: 63.2 mg/dL
Microalb/Creat Ratio: <5 mg/g creat (ref 0–29)
Microalbumin, Urine: <3 ug/mL

## 2022-01-26 ENCOUNTER — Telehealth: Payer: Medicare Other

## 2022-01-26 ENCOUNTER — Telehealth: Payer: Self-pay

## 2022-01-26 NOTE — Telephone Encounter (Signed)
?  Care Management  ? ?Follow Up Note ? ? ?01/26/2022 ?Name: Safira Proffit MRN: [id number] DOB: 01-31-1941 ? ? ?Referred by: Pcp, No ?Reason for referral : Chronic Care Management (RN CM Follow up call ) ? ? ?An unsuccessful telephone outreach was attempted today. The patient was referred to the case management team for assistance with care management and care coordination.  ? ?Follow Up Plan: A HIPPA compliant phone message was left for the patient providing contact information and requesting a return call.  ? ?Barb Merino, RN, BSN, CCM ?Care Management Coordinator ?[facility]  ?Direct Phone: [phone] ? ? ?

## 2022-02-04 ENCOUNTER — Telehealth: Payer: Self-pay

## 2022-02-04 NOTE — Chronic Care Management (AMB) (Signed)
? ? ?Chronic Care Management ?Pharmacy Assistant  ? ?Name: Allison Franco  MRN: [id number] DOB: [birth year] ? ?Reason for Encounter: Disease State/ General ? ?Recent office visits:  ?01-24-2022 Allison Franco, [place]. CHANGE metformin 500 mg twice daily TO 500 mg daily. EKG completed. ? ?12-15-2021  Allison Franco, [place]. Mammogram and DEXA scan completed. ? ?12-09-2021 Allison Simmering, LPN. Medicare annual visit. ? ?12-08-2021 Allison Castilla, NP. Glucose= 114. A1C= 5.7. HDL= 38. Referral placed to Orthopedics. STOP clonazepam. ? ?Recent consult visits:  ?12-15-2021 Garald Balding, MD (Orthopedic surgery). Xray of both knees completed. Lidocaine injection given ? ?Hospital visits:  ?Medication Reconciliation was completed by comparing discharge summary, patient?s EMR and Pharmacy list, and upon discussion with patient. ? ?Admitted to the hospital on 01-13-2022 due to Primary insomnia. Discharge date was 01-13-2022. Discharged from [facility] urgent care. ? ?New?Medications Started at [facility] Discharge:?? ?Benadryl 25 mg take 1-2 tablets nightly. ? ?Medication Changes at Hospital Discharge: ?None ? ?Medications Discontinued at Hospital Discharge: ?None ? ?Medications that remain the same after Hospital Discharge:??  ?-All other medications will remain the same.   ? ?Medications: ?Outpatient Encounter Medications as of 02/04/2022  ?Medication Sig Note  ? ACCU-CHEK GUIDE test strip TEST 3 TIMES DAILY   ? acetaminophen (TYLENOL) 500 MG tablet Take 1 tablet (500 mg total) by mouth every 6 (six) hours as needed for mild pain, moderate pain or headache. Reported on 05/10/2016   ? Alcohol Swabs (ALCOHOL WIPES) 70 % PADS by Does not apply route. Use with blood sugar check and injection of insulin   ? blood glucose meter kit and supplies KIT Dispense based on patient and insurance preference. Use up to four times daily as directed. (FOR ICD-9 250.00, 250.01).   ? Cholecalciferol (VITAMIN D3) 25 MCG (1000 UT) CAPS Take 1 capsule  (1,000 Units total) by mouth daily.   ? cloNIDine (CATAPRES) 0.1 MG tablet Take 1 tablet by mouth daily if systolic blood pressure over 160. If remains elevated call to office   ? dorzolamide-timolol (COSOPT) 22.3-6.8 MG/ML ophthalmic solution Instill 1 drop into both eyes twice a day   ? latanoprost (XALATAN) 0.005 % ophthalmic solution PLACE 1 DROP EACH EYE ONCE A DAY 05/23/2021: Pt states not taking due to side effects  ? metFORMIN (GLUCOPHAGE) 500 MG tablet Take 1 tablet (500 mg total) by mouth daily with breakfast.   ? Multiple Vitamins-Minerals (CENTRUM SILVER 50+WOMEN) TABS Take 1 tablet by mouth daily.   ? olmesartan (BENICAR) 20 MG tablet Take 1 tablet (20 mg total) by mouth daily.   ? traZODone (DESYREL) 50 MG tablet Take 1 tablet (50 mg total) by mouth at bedtime.   ? Zoster Vaccine Adjuvanted [facility]) injection Inject 0.5 mLs into the muscle once for 1 dose. Administer 2nd dose in 2-6 months  ?Please fax when each dose administered [phone]   ? ?No facility-administered encounter medications on file as of 02/04/2022.  ? ?Contacted [name] for general disease state and medication adherence call.  ? ?Patient is not > 5 days past due for refill on the following medications per chart history: ? ?What concerns do you have about your medications? Patient stated no ? ?The patient denies side effects with her medications.  ? ?How often do you forget or accidentally miss a dose? Never ? ?Do you use a pillbox? No ? ?Are you having any problems getting your medications from your pharmacy? No ? ?Has the cost of your medications been a concern? No ?If yes,  what medication and is patient assistance available or has it been applied for? ? ?Since last visit with CPP, no interventions have been made:  ? ?The patient has had an ED visit since last contact.  ? ?The patient denies problems with their health.  ? ?she denies  concerns or questions for [name], at this time.  ? ?Patient states BP and BG readings  are as follows: 138/84, 134/83 ? ?Fasting: 115, 113 ?Before meals: None ?After meals: None ?Bedtime: None ? ?Care Gaps: ?Covid booster overdue ? ?Star Rating Drugs: ?Olmesartan 20 mg- Last filed 12-08-2021 90 [facility] ?Metformin 500 mg- Last filled 01-12-2022  100 [facility] outpatient ? ?[name] CMA ?Clinical Pharmacist Assistant ?[phone] ? ?

## 2022-02-06 ENCOUNTER — Encounter (HOSPITAL_COMMUNITY): Payer: Self-pay | Admitting: *Deleted

## 2022-02-06 ENCOUNTER — Ambulatory Visit (HOSPITAL_COMMUNITY)
Admission: EM | Admit: 2022-02-06 | Discharge: 2022-02-06 | Disposition: A | Discharge status: Home / Self Care | Payer: Medicare Other | Attending: Family Medicine | Admitting: Family Medicine

## 2022-02-06 ENCOUNTER — Other Ambulatory Visit: Payer: Self-pay

## 2022-02-06 DIAGNOSIS — J069 Acute upper respiratory infection, unspecified: Secondary | ICD-10-CM | POA: Insufficient documentation

## 2022-02-06 DIAGNOSIS — I10 Essential (primary) hypertension: Secondary | ICD-10-CM | POA: Diagnosis not present

## 2022-02-06 DIAGNOSIS — E119 Type 2 diabetes mellitus without complications: Secondary | ICD-10-CM | POA: Insufficient documentation

## 2022-02-06 DIAGNOSIS — Z20822 Contact with and (suspected) exposure to covid-19: Secondary | ICD-10-CM | POA: Insufficient documentation

## 2022-02-06 DIAGNOSIS — R059 Cough, unspecified: Secondary | ICD-10-CM | POA: Diagnosis not present

## 2022-02-06 LAB — SARS CORONAVIRUS 2 (TAT 6-24 HRS): SARS Coronavirus 2: NEGATIVE

## 2022-02-06 MED ORDER — BENZONATATE 100 MG PO CAPS: 100.0000 mg | ORAL_CAPSULE | Freq: Three times a day (TID) | ORAL | 0 refills | Status: DC | PRN

## 2022-02-06 MED ORDER — IBUPROFEN 400 MG PO TABS: 400.0000 mg | ORAL_TABLET | Freq: Three times a day (TID) | ORAL | 0 refills | Status: DC | PRN

## 2022-02-06 NOTE — ED Triage Notes (Signed)
C/O cough and sneezing onset yesterday. Reports difficulty sleeping due to cough. Unsure if fevers. Denies any dizziness. Pt c/o discomfort from throat all down into chest and also in bilat ears "only when coughing". ?

## 2022-02-06 NOTE — Discharge Instructions (Addendum)
?  You have been swabbed for COVID, and the test will result in the next 24 hours. Our staff will call you if positive. If the test is positive, you should quarantine for 5 days.  ? ?Take benzonatate 100 mg--1 every 8 hours as needed for cough ? ?Take ibuprofen 400 mg 1 every 8 hours as needed for pain. ?

## 2022-02-06 NOTE — ED Provider Notes (Signed)
[place]    CSN: [id number] Arrival date & time: 02/06/22  1020      History   Chief Complaint Chief Complaint  Patient presents with   Cough    HPI Allison Franco is a 81 y.o. female.    Cough Here for coughing and sneezing and rhinorrhea that began last night.  She coughed a lot overnight and it made it difficult to sleep.  No fever or chills noted.  She has had some aching in her knee more than usual but otherwise is not having any myalgia.  She does hurt in her throat and into her anterior chest and ears when she is coughing only.  No nausea or vomiting or diarrhea.  She was able to eat with a good appetite this morning  Past medical history significant for diabetes and hypertension.  Her sugars are running around 100-1 15 at the most.  Past Medical History:  Diagnosis Date   Anxiety    Constipation    Depression    "following husband's death"   Diabetes mellitus without complication ([place])    Hypertension    KNEE PAIN, BILATERAL 02/13/2009   Qualifier: Diagnosis of  By: Hassell Done FNP, Tori Milks     Tinnitus     Patient Active Problem List   Diagnosis Date Noted   Primary open angle glaucoma (POAG) of both eyes, severe stage 07/16/2020   Inguinal hernia with bowel obstruction 08/07/2019   Incarcerated left inguinal hernia 08/07/2019   Anemia 08/31/2018   Essential hypertension 07/03/2018   Type 2 diabetes mellitus without complication, without long-term current use of insulin ([place]) 07/02/2018   Obesity 10/20/2013   ALLERGIC RHINITIS 08/11/2009   INSOMNIA 07/17/2008    Past Surgical History:  Procedure Laterality Date   ABDOMINAL HYSTERECTOMY     COLON RESECTION N/A 08/07/2019   Procedure: DIAGNOSTIC LAPAROSCOPY WITH OPEN LEFT INGUINAL HERNIA WITH MESH;  Surgeon: Johnathan Hausen, MD;  Location: [facility];  Service: General;  Laterality: N/A;   fribroid surgery      OB History   No obstetric history on file.      Home Medications    Prior to  Admission medications   Medication Sig Start Date End Date Taking? Authorizing Provider  benzonatate (TESSALON) 100 MG capsule Take 1 capsule (100 mg total) by mouth 3 (three) times daily as needed for cough. 02/06/22  Yes Barrett Henle, MD  Cholecalciferol (VITAMIN D3) 25 MCG (1000 UT) CAPS Take 1 capsule (1,000 Units total) by mouth daily. 06/02/21  Yes [name], FNP  cloNIDine (CATAPRES) 0.1 MG tablet Take 1 tablet by mouth daily if systolic blood pressure over 160. If remains elevated call to office 06/02/21  Yes [name], FNP  dorzolamide-timolol (COSOPT) 22.3-6.8 MG/ML ophthalmic solution Instill 1 drop into both eyes twice a day 10/28/21  Yes   ibuprofen (ADVIL) 400 MG tablet Take 1 tablet (400 mg total) by mouth every 8 (eight) hours as needed (pain). 02/06/22  Yes Barrett Henle, MD  MELATONIN PO Take by mouth.   Yes [provider]  metFORMIN (GLUCOPHAGE) 500 MG tablet Take 1 tablet (500 mg total) by mouth daily with breakfast. 01/24/22  Yes [name], FNP  Multiple Vitamins-Minerals (CENTRUM SILVER 50+WOMEN) TABS Take 1 tablet by mouth daily.   Yes [provider]  olmesartan (BENICAR) 20 MG tablet Take 1 tablet (20 mg total) by mouth daily. 12/08/21  Yes Ghumman, Ramandeep, NP  ACCU-CHEK GUIDE test strip TEST 3 TIMES DAILY  12/13/21   [name], FNP  acetaminophen (TYLENOL) 500 MG tablet Take 1 tablet (500 mg total) by mouth every 6 (six) hours as needed for mild pain, moderate pain or headache. Reported on 05/10/2016 08/09/19   Kalman Drape, PA  Alcohol Swabs (ALCOHOL WIPES) 70 % PADS by Does not apply route. Use with blood sugar check and injection of insulin    [provider]  blood glucose meter kit and supplies KIT Dispense based on patient and insurance preference. Use up to four times daily as directed. (FOR ICD-9 250.00, 250.01). 07/05/18   Florencia Reasons, MD  latanoprost (XALATAN) 0.005 % ophthalmic solution PLACE 1 DROP EACH EYE ONCE A DAY  02/12/21 02/12/22    traZODone (DESYREL) 50 MG tablet Take 1 tablet (50 mg total) by mouth at bedtime. 12/08/21   Bary Castilla, NP  Zoster Vaccine Adjuvanted [facility]) injection Inject 0.5 mLs into the muscle once for 1 dose. Administer 2nd dose in 2-6 months  Please fax when each dose administered [phone] 08/23/21   [name], FNP    Family History Family History  Problem Relation Age of Onset   Diabetes Mother    Hypertension Father    Breast cancer Neg Hx     Social History Social History   Tobacco Use   Smoking status: Never   Smokeless tobacco: Never  Vaping Use   Vaping Use: Never used  Substance Use Topics   Alcohol use: No   Drug use: No     Allergies   Aspirin, Hydroxyzine, and Valium [diazepam]   Review of Systems Review of Systems  Respiratory:  Positive for cough.     Physical Exam Triage Vital Signs ED Triage Vitals  Enc Vitals Group     BP 02/06/22 1044 134/75     Pulse Rate 02/06/22 1044 76     Resp 02/06/22 1044 20     Temp 02/06/22 1044 98.1 F (36.7 C)     Temp Source 02/06/22 1044 Oral     SpO2 02/06/22 1044 98 %     Weight --      Height --      Head Circumference --      Peak Flow --      Pain Score 02/06/22 1047 0     Pain Loc --      Pain Edu? --      Excl. in [place]? --    No data found.  Updated Vital Signs BP 134/75    Pulse 76    Temp 98.1 F (36.7 C) (Oral)    Resp 20    SpO2 98%   Visual Acuity Right Eye Distance:   Left Eye Distance:   Bilateral Distance:    Right Eye Near:   Left Eye Near:    Bilateral Near:     Physical Exam Vitals reviewed.  Constitutional:      General: She is not in acute distress.    Appearance: She is not toxic-appearing.  HENT:     Right Ear: Ear canal normal.     Left Ear: Ear canal normal.     Ears:     Comments: Tympanic membrane's are bilaterally white and gray and scarred and dull    Nose: Nose normal.     Mouth/Throat:     Mouth: Mucous membranes are moist.      Pharynx: No oropharyngeal exudate or posterior oropharyngeal erythema.  Eyes:     Extraocular Movements: Extraocular movements intact.  Conjunctiva/sclera: Conjunctivae normal.     Pupils: Pupils are equal, round, and reactive to light.  Cardiovascular:     Rate and Rhythm: Normal rate and regular rhythm.     Heart sounds: No murmur heard. Pulmonary:     Effort: Pulmonary effort is normal. No respiratory distress.     Breath sounds: No wheezing, rhonchi or rales.  Chest:     Chest wall: No tenderness.  Musculoskeletal:     Cervical back: Neck supple.  Lymphadenopathy:     Cervical: No cervical adenopathy.  Skin:    Capillary Refill: Capillary refill takes less than 2 seconds.     Coloration: Skin is not jaundiced or pale.  Neurological:     General: No focal deficit present.     Mental Status: She is alert and oriented to person, place, and time.  Psychiatric:        Behavior: Behavior normal.     UC Treatments / Results  Labs (all labs ordered are listed, but only abnormal results are displayed) Labs Reviewed  SARS CORONAVIRUS 2 (TAT 6-24 HRS)    EKG   Radiology No results found.  Procedures Procedures (including critical care time)  Medications Ordered in UC Medications - No data to display  Initial Impression / Assessment and Plan / UC Course  I have reviewed the triage vital signs and the nursing notes.  Pertinent labs & imaging results that were available during my care of the patient were reviewed by me and considered in my medical decision making (see chart for details).     We will swab for COVID.  She is on day 2 at the most of her illness.  Renal function was normal and January of this year.  She has diabetes, hypertension, and advanced age.  She is at risk for severe disease with the COVID.  If she is positive for COVID she should receive a prescription for Paxlovid, unless there are interactions with her current medications.  If so then she should  receive a prescription for molnupiravir instead  She reports an aspirin allergy, but is actually a side effect.  She has had insomnia and restlessness with aspirin. Final Clinical Impressions(s) / UC Diagnoses   Final diagnoses:  Viral URI with cough     Discharge Instructions       You have been swabbed for COVID, and the test will result in the next 24 hours. Our staff will call you if positive. If the test is positive, you should quarantine for 5 days.   Take benzonatate 100 mg--1 every 8 hours as needed for cough  Take ibuprofen 400 mg 1 every 8 hours as needed for pain.     ED Prescriptions     Medication Sig Dispense Auth. Provider   benzonatate (TESSALON) 100 MG capsule Take 1 capsule (100 mg total) by mouth 3 (three) times daily as needed for cough. 21 capsule Barrett Henle, MD   ibuprofen (ADVIL) 400 MG tablet Take 1 tablet (400 mg total) by mouth every 8 (eight) hours as needed (pain). 15 tablet , Gwenlyn Perking, MD      PDMP not reviewed this encounter.   Barrett Henle, MD 02/06/22 1104

## 2022-02-07 ENCOUNTER — Telehealth: Payer: Medicare Other

## 2022-02-07 ENCOUNTER — Telehealth: Payer: Self-pay

## 2022-02-07 NOTE — Telephone Encounter (Signed)
?  Care Management  ? ?Follow Up Note ? ? ?02/07/2022 ?Name: Tamzin Bertling MRN: [id number] DOB: [birth year] ? ? ?Referred by: Pcp, No ?Reason for referral : Chronic Care Management (RN CM Follow up call ) ? ? ?A second unsuccessful telephone outreach was attempted today. The patient was referred to the case management team for assistance with care management and care coordination.  ? ?Follow Up Plan: A HIPPA compliant phone message was left for the patient providing contact information and requesting a return call.  ? ?Barb Merino, RN, BSN, CCM ?Care Management Coordinator ?[facility] [facility]  ?Direct Phone: [phone] ? ? ?

## 2022-02-16 ENCOUNTER — Other Ambulatory Visit (HOSPITAL_COMMUNITY): Payer: Self-pay

## 2022-02-24 DIAGNOSIS — I10 Essential (primary) hypertension: Secondary | ICD-10-CM | POA: Diagnosis not present

## 2022-02-24 DIAGNOSIS — Z7984 Long term (current) use of oral hypoglycemic drugs: Secondary | ICD-10-CM | POA: Diagnosis not present

## 2022-02-24 DIAGNOSIS — Z638 Other specified problems related to primary support group: Secondary | ICD-10-CM | POA: Diagnosis not present

## 2022-02-25 ENCOUNTER — Telehealth: Payer: Medicare Other

## 2022-02-28 ENCOUNTER — Telehealth: Payer: Medicare Other

## 2022-02-28 ENCOUNTER — Ambulatory Visit: Payer: Self-pay

## 2022-02-28 ENCOUNTER — Other Ambulatory Visit (HOSPITAL_COMMUNITY): Payer: Self-pay

## 2022-02-28 DIAGNOSIS — E119 Type 2 diabetes mellitus without complications: Secondary | ICD-10-CM

## 2022-02-28 DIAGNOSIS — G8929 Other chronic pain: Secondary | ICD-10-CM

## 2022-02-28 DIAGNOSIS — I10 Essential (primary) hypertension: Secondary | ICD-10-CM

## 2022-02-28 DIAGNOSIS — I700 Atherosclerosis of aorta: Secondary | ICD-10-CM

## 2022-02-28 NOTE — Patient Instructions (Signed)
Visit Information ? ?Thank you for taking time to visit with me today. Please don't hesitate to contact me if I can be of assistance to you before our next scheduled telephone appointment. ? ?Following are the goals we discussed today:  ?(Copy and paste patient goals from clinical care plan here) ? ?Our next appointment is by telephone on 05/05/22 at 10:30 AM ? ?Please call the care guide team at [phone] if you need to cancel or reschedule your appointment.  ? ?If you are experiencing a Mental Health or [place] or need someone to talk to, please call [phone]-TALK (toll free, 24 hour hotline)  ? ?Patient verbalizes understanding of instructions and care plan provided today and agrees to view in [place]. Active MyChart status confirmed with patient.   ? ?Barb Merino, RN, BSN, CCM ?Care Management Coordinator ?[place] [facility]  ?Direct Phone: [phone] ? ? ?

## 2022-02-28 NOTE — Chronic Care Management (AMB) (Signed)
?Chronic Care Management  ? ?CCM RN Visit Note ? ?02/28/2022 ?Name: Allison Franco MRN: [id number] DOB: 05-16-1941 ? ?Subjective: ?Allison Franco is a 81 y.o. year old female who is a primary care patient of Pcp, No. The care management team was consulted for assistance with disease management and care coordination needs.   ? ?Engaged with patient by telephone for follow up visit in response to provider referral for case management and/or care coordination services.  ? ?Consent to Services:  ?The patient was given information about Chronic Care Management services, agreed to services, and gave verbal consent prior to initiation of services.  Please see initial visit note for detailed documentation.  ? ?Patient agreed to services and verbal consent obtained.  ? ?Assessment: Review of patient past medical history, allergies, medications, health status, including review of consultants reports, laboratory and other test data, was performed as part of comprehensive evaluation and provision of chronic care management services.  ? ?SDOH (Social Determinants of Health) assessments and interventions performed:   ? ?CCM Care Plan ? ?Allergies  ?Allergen Reactions  ? Aspirin Other (See Comments)  ?  insomnia  ? Hydroxyzine Other (See Comments)  ?  insomnia  ? Valium [Diazepam] Other (See Comments)  ?  Insomnia, 05/10/16 pt states she took as young person- made her depressed  ? ? ?Outpatient Encounter Medications as of 02/28/2022  ?Medication Sig  ? ACCU-CHEK GUIDE test strip TEST 3 TIMES DAILY  ? acetaminophen (TYLENOL) 500 MG tablet Take 1 tablet (500 mg total) by mouth every 6 (six) hours as needed for mild pain, moderate pain or headache. Reported on 05/10/2016  ? Alcohol Swabs (ALCOHOL WIPES) 70 % PADS by Does not apply route. Use with blood sugar check and injection of insulin  ? benzonatate (TESSALON) 100 MG capsule Take 1 capsule (100 mg total) by mouth 3 (three) times daily as needed for cough.  ? blood glucose meter kit and  supplies KIT Dispense based on patient and insurance preference. Use up to four times daily as directed. (FOR ICD-9 250.00, 250.01).  ? Cholecalciferol (VITAMIN D3) 25 MCG (1000 UT) CAPS Take 1 capsule (1,000 Units total) by mouth daily.  ? cloNIDine (CATAPRES) 0.1 MG tablet Take 1 tablet by mouth daily if systolic blood pressure over 160. If remains elevated call to office  ? dorzolamide-timolol (COSOPT) 22.3-6.8 MG/ML ophthalmic solution Instill 1 drop into both eyes twice a day  ? ibuprofen (ADVIL) 400 MG tablet Take 1 tablet (400 mg total) by mouth every 8 (eight) hours as needed (pain).  ? MELATONIN PO Take by mouth.  ? metFORMIN (GLUCOPHAGE) 500 MG tablet Take 1 tablet (500 mg total) by mouth daily with breakfast.  ? Multiple Vitamins-Minerals (CENTRUM SILVER 50+WOMEN) TABS Take 1 tablet by mouth daily.  ? olmesartan (BENICAR) 20 MG tablet Take 1 tablet (20 mg total) by mouth daily.  ? traZODone (DESYREL) 50 MG tablet Take 1 tablet (50 mg total) by mouth at bedtime.  ? Zoster Vaccine Adjuvanted [facility]) injection Inject 0.5 mLs into the muscle once for 1 dose. Administer 2nd dose in 2-6 months  ?Please fax when each dose administered [phone]  ? ?No facility-administered encounter medications on file as of 02/28/2022.  ? ? ?Patient Active Problem List  ? Diagnosis Date Noted  ? Primary open angle glaucoma (POAG) of both eyes, severe stage 07/16/2020  ? Inguinal hernia with bowel obstruction 08/07/2019  ? Incarcerated left inguinal hernia 08/07/2019  ? Anemia 08/31/2018  ? Essential hypertension 07/03/2018  ?  Type 2 diabetes mellitus without complication, without long-term current use of insulin ([place]) 07/02/2018  ? Obesity 10/20/2013  ? ALLERGIC RHINITIS 08/11/2009  ? INSOMNIA 07/17/2008  ? ? 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 ?Care Plan : Osteoarthritis (Adult)  ?Updates made by Lynne Logan, RN since 02/28/2022 12:00 AM  ?Completed  02/28/2022  ? ?Care Plan : RN Care Manager Plan of Care  ?Updates made by Lynne Logan, RN since 02/28/2022 12:00 AM  ?  ? 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  ? 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, Aortic atherosclerosis disease process and self health management plan as evidenced by patient will report having no disease exacerbations related to chronic disease states listed above ?take all medications exactly as prescribed and will call provider for medication related questions as evidenced by patient will report having no missed doses of her prescribed medications ?demonstrate Ongoing health management independence as evidenced by patient will remain to be independent with self care ?continue to work with RN Care Manager to address care management and care coordination needs related to  DM II, HTN, 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  ? ?Interventions: ?1:1 collaboration with primary care provider regarding development and update of comprehensive plan of care as evidenced by provider  attestation and co-signature ?Inter-disciplinary care team collaboration (see longitudinal plan of care) ?Evaluation of current treatment plan related to  self management and patient's adherence to plan as established by provider ? ?Prediabetes Interventions:  (Status:  Condition stable.  Not addressed this visit.) Long Term Goal ?Assessed patient's understanding of A1c goal:  <5.7 ?Educated patient on dietary and exercise recommendations  ?Reviewed medications with patient and discussed importance of medication adherence ?Counseled on importance of regular laboratory monitoring as prescribed ?Discussed plans with patient for ongoing care management follow up and provided patient with direct contact information for care management team ?Assessed social determinant of health barriers ? ?Lab Results  ?Component Value Date  ? HGBA1C 5.7 (H) 08/23/2021  ? ?Hypertension Interventions:  (Status:  Goal on track:  Yes.) Long Term Goal ?Last practice recorded BP readings:  ?BP Readings from Last 3 Encounters:  ?08/23/21 128/62  ?07/26/21 (!) 168/85  ?05/23/21 (!) 158/73  ?Most recent eGFR/CrCl:  ?Lab Results  ?Component Value Date  ? EGFR 69 04/19/2021  ?  No components found for: CRCL ?Evaluation of current treatment plan related to hypertension self management and patient's adherence to plan as established by provider ?Reviewed medications with patient and discussed importance of compliance ?Counseled on the importance  of exercise goals with target of 150 minutes per week ?Provided education on prescribed diet low Sodium ?Discussed complications of poorly controlled blood pressure such as heart disease, stroke, circulatory complications, vision complications, kidney impairment, sexual dysfunction  ?Discussed plans with patient for ongoing care management follow up and provided patient with direct contact information for care management team ? ?Hyperlipidemia Interventions:  (Status:  Condition stable.  Not addressed this  visit.) Long Term Goal ?Medication review performed; medication list updated in electronic medical record.  ?Provider established cholesterol goals reviewed ?Counseled on importance of regular laboratory monitor

## 2022-03-09 ENCOUNTER — Telehealth: Payer: Self-pay

## 2022-03-09 NOTE — Chronic Care Management (AMB) (Addendum)
? ? ?Chronic Care Management ?Pharmacy Assistant  ? ?Name: Allison Franco  MRN: [id number] DOB: 11/13/1941 ? ? ?Reason for Encounter: Disease State/ Hypertension ? ?Recent office visits:  ?02-28-2022 Little, [name], RN (CCM) ? ?Recent consult visits:  ?None ? ?Hospital visits:  ?Medication Reconciliation was completed by comparing discharge summary, patient?s EMR and Pharmacy list, and upon discussion with patient. ? ?Admitted to the hospital on 02-06-2022 due to Viral URI with cough. Discharge date was 02-06-2022. Discharged from [facility].  ? ?New?Medications Started at [facility] Discharge:?? ?Benzonatate 100 mg 3 times daily as needed ?Advil 400 mg every 8 hours as needed ? ?Medication Changes at Hospital Discharge: ?None ? ?Medications Discontinued at Hospital Discharge: ?None ? ?Medications that remain the same after Hospital Discharge:??  ?-All other medications will remain the same.   ? ?Hospital visits:  ?Medication Reconciliation was completed by comparing discharge summary, patient?s EMR and Pharmacy list, and upon discussion with patient. ?  ?Admitted to the hospital on 01-13-2022 due to Primary insomnia. Discharge date was 01-13-2022. Discharged from [facility] urgent care. ?  ?New?Medications Started at [facility] Discharge:?? ?Benadryl 25 mg take 1-2 tablets nightly. ?  ?Medication Changes at Hospital Discharge: ?None ?  ?Medications Discontinued at Hospital Discharge: ?None ?  ?Medications that remain the same after Hospital Discharge:??  ?-All other medications will remain the same.   ?  ?Medications: ?Outpatient Encounter Medications as of 03/09/2022  ?Medication Sig  ? ACCU-CHEK GUIDE test strip TEST 3 TIMES DAILY  ? acetaminophen (TYLENOL) 500 MG tablet Take 1 tablet (500 mg total) by mouth every 6 (six) hours as needed for mild pain, moderate pain or headache. Reported on 05/10/2016  ? Alcohol Swabs (ALCOHOL WIPES) 70 % PADS by Does not apply route. Use with blood sugar check and  injection of insulin  ? benzonatate (TESSALON) 100 MG capsule Take 1 capsule (100 mg total) by mouth 3 (three) times daily as needed for cough.  ? blood glucose meter kit and supplies KIT Dispense based on patient and insurance preference. Use up to four times daily as directed. (FOR ICD-9 250.00, 250.01).  ? Cholecalciferol (VITAMIN D3) 25 MCG (1000 UT) CAPS Take 1 capsule (1,000 Units total) by mouth daily.  ? cloNIDine (CATAPRES) 0.1 MG tablet Take 1 tablet by mouth daily if systolic blood pressure over 160. If remains elevated call to office  ? dorzolamide-timolol (COSOPT) 22.3-6.8 MG/ML ophthalmic solution Instill 1 drop into both eyes twice a day  ? ibuprofen (ADVIL) 400 MG tablet Take 1 tablet (400 mg total) by mouth every 8 (eight) hours as needed (pain).  ? MELATONIN PO Take by mouth.  ? metFORMIN (GLUCOPHAGE) 500 MG tablet Take 1 tablet (500 mg total) by mouth daily with breakfast.  ? Multiple Vitamins-Minerals (CENTRUM SILVER 50+WOMEN) TABS Take 1 tablet by mouth daily.  ? olmesartan (BENICAR) 20 MG tablet Take 1 tablet (20 mg total) by mouth daily.  ? traZODone (DESYREL) 50 MG tablet Take 1 tablet (50 mg total) by mouth at bedtime.  ? Zoster Vaccine Adjuvanted [facility]) injection Inject 0.5 mLs into the muscle once for 1 dose. Administer 2nd dose in 2-6 months  ?Please fax when each dose administered [phone]  ? ?No facility-administered encounter medications on file as of 03/09/2022.  ? ?Reviewed chart prior to disease state call. Spoke with patient regarding BP ? ?Recent Office Vitals: ?BP Readings from Last 3 Encounters:  ?02/06/22 134/75  ?01/24/22 128/68  ?01/13/22 133/81  ? ?Pulse Readings from  Last 3 Encounters:  ?02/06/22 76  ?01/24/22 71  ?01/13/22 90  ?  ?Wt Readings from Last 3 Encounters:  ?01/24/22 174 lb 12.8 oz (79.3 kg)  ?12/15/21 180 lb (81.6 kg)  ?12/09/21 180 lb (81.6 kg)  ?  ? ?Kidney Function ?Lab Results  ?Component Value Date/Time  ? CREATININE 0.84 12/08/2021 10:24 AM  ?  CREATININE 0.74 07/26/2021 04:10 PM  ? GFRNONAA >60 07/26/2021 04:10 PM  ? GFRAA 78 11/11/2020 12:32 PM  ? ? ? ?  Latest Ref Rng & Units 12/08/2021  ? 10:24 AM 07/26/2021  ?  4:10 PM 04/19/2021  ?  9:59 AM  ?BMP  ?Glucose 70 - 99 mg/dL 114   99   123    ?BUN 8 - 27 mg/dL _0 ?Creatinine 0.57 - 1.00 mg/dL 0.84   0.74   0.86    ?BUN/Creat Ratio 12 - _1 ?Sodium 134 - 144 mmol/L 138   136   137    ?Potassium 3.5 - 5.2 mmol/L 4.7   4.6   4.8    ?Chloride 96 - 106 mmol/L 100   101   99    ?CO2 20 - 29 mmol/L _2 ?Calcium 8.7 - 10.3 mg/dL 9.0   9.3   9.1    ? ? ?Current antihypertensive regimen:  ?Olmesartan 20 mg daily ? ?How often are you checking your Blood Pressure? 1-2x per week ? ?Current home BP readings: 04-12 176/109 patient stated she was busy this morning. Patient stated she hasn't took blood pressure in a while. Patient will start back taking blood pressure daily. Patient rechecked blood pressure after settling down and it was 156/88. ? ?What recent interventions/DTPs have been made by any provider to improve Blood Pressure control since last CPP Visit:  ?Educated on Daily salt intake goal < 2300 mg; ?-Counseled to monitor BP at home at least four times per week, document, and provide log at future appointments ?-Counseled on diet and exercise extensively ?Recommended to continue current medication ? ?Any recent hospitalizations or ED visits since last visit with CPP? Yes ? ?What diet changes have been made to improve Blood Pressure Control?  ?Patient stated she drinks lots of water daily and limits salt intake. ? ?What exercise is being done to improve your Blood Pressure Control?  ?Patient stated she walks a lot.  ? ?Adherence Review: ?Is the patient currently on ACE/ARB medication? Yes ?Does the patient have >5 day gap between last estimated fill dates? No ? ?Care Gaps: ?Covid booster overdue ?AWV 12-21-2022 ? ?Star Rating Drugs: ?Olmesartan 20 mg- Last filled 12-08-2021 90  DS [facility] ?Metformin 500 mg- Last filled 01-12-2022 100 DS [facility] ? ?[name] CMA ?Clinical Pharmacist Assistant ?[phone] ? ?

## 2022-03-16 ENCOUNTER — Other Ambulatory Visit (HOSPITAL_COMMUNITY): Payer: Self-pay

## 2022-03-17 ENCOUNTER — Other Ambulatory Visit (HOSPITAL_COMMUNITY): Payer: Self-pay

## 2022-03-30 ENCOUNTER — Telehealth: Payer: Self-pay

## 2022-03-30 ENCOUNTER — Other Ambulatory Visit (HOSPITAL_COMMUNITY): Payer: Self-pay

## 2022-03-30 DIAGNOSIS — H401111 Primary open-angle glaucoma, right eye, mild stage: Secondary | ICD-10-CM | POA: Diagnosis not present

## 2022-03-30 DIAGNOSIS — H401123 Primary open-angle glaucoma, left eye, severe stage: Secondary | ICD-10-CM | POA: Diagnosis not present

## 2022-03-30 NOTE — Chronic Care Management (AMB) (Signed)
? ? ?Chronic Care Management ?Pharmacy Assistant  ? ?Name: Allison Franco  MRN: [id number] DOB: [birth year] ? ? ?Reason for Encounter: Disease State/ General ?  ?Recent office visits:  ?None ? ?Recent consult visits:  ?None ? ?Hospital visits:  ?Medication Reconciliation was completed by comparing discharge summary, patient?s EMR and Pharmacy list, and upon discussion with patient. ?  ?Admitted to the hospital on 02-06-2022 due to Viral URI with cough. Discharge date was 02-06-2022. Discharged from [facility].  ?  ?New?Medications Started at [facility] Discharge:?? ?Benzonatate 100 mg 3 times daily as needed ?Advil 400 mg every 8 hours as needed ?  ?Medication Changes at Hospital Discharge: ?None ?  ?Medications Discontinued at Hospital Discharge: ?None ?  ?Medications that remain the same after Hospital Discharge:??  ?-All other medications will remain the same.   ?  ?Hospital visits:  ?Medication Reconciliation was completed by comparing discharge summary, patient?s EMR and Pharmacy list, and upon discussion with patient. ?  ?Admitted to the hospital on 01-13-2022 due to Primary insomnia. Discharge date was 01-13-2022. Discharged from [facility] urgent care. ?  ?New?Medications Started at [facility] Discharge:?? ?Benadryl 25 mg take 1-2 tablets nightly. ?  ?Medication Changes at Hospital Discharge: ?None ?  ?Medications Discontinued at Hospital Discharge: ?None ?  ?Medications that remain the same after Hospital Discharge:??  ?-All other medications will remain the same.   ? ?Medications: ?Outpatient Encounter Medications as of 03/30/2022  ?Medication Sig  ? ACCU-CHEK GUIDE test strip TEST 3 TIMES DAILY  ? acetaminophen (TYLENOL) 500 MG tablet Take 1 tablet (500 mg total) by mouth every 6 (six) hours as needed for mild pain, moderate pain or headache. Reported on 05/10/2016  ? Alcohol Swabs (ALCOHOL WIPES) 70 % PADS by Does not apply route. Use with blood sugar check and injection of insulin  ? benzonatate  (TESSALON) 100 MG capsule Take 1 capsule (100 mg total) by mouth 3 (three) times daily as needed for cough.  ? blood glucose meter kit and supplies KIT Dispense based on patient and insurance preference. Use up to four times daily as directed. (FOR ICD-9 250.00, 250.01).  ? Cholecalciferol (VITAMIN D3) 25 MCG (1000 UT) CAPS Take 1 capsule (1,000 Units total) by mouth daily.  ? cloNIDine (CATAPRES) 0.1 MG tablet Take 1 tablet by mouth daily if systolic blood pressure over 160. If remains elevated call to office  ? dorzolamide-timolol (COSOPT) 22.3-6.8 MG/ML ophthalmic solution Instill 1 drop into both eyes twice a day  ? ibuprofen (ADVIL) 400 MG tablet Take 1 tablet (400 mg total) by mouth every 8 (eight) hours as needed (pain).  ? MELATONIN PO Take by mouth.  ? metFORMIN (GLUCOPHAGE) 500 MG tablet Take 1 tablet (500 mg total) by mouth daily with breakfast.  ? Multiple Vitamins-Minerals (CENTRUM SILVER 50+WOMEN) TABS Take 1 tablet by mouth daily.  ? olmesartan (BENICAR) 20 MG tablet Take 1 tablet (20 mg total) by mouth daily.  ? traZODone (DESYREL) 50 MG tablet Take 1 tablet (50 mg total) by mouth at bedtime.  ? Zoster Vaccine Adjuvanted [facility]) injection Inject 0.5 mLs into the muscle once for 1 dose. Administer 2nd dose in 2-6 months  ?Please fax when each dose administered [phone]  ? ?No facility-administered encounter medications on file as of 03/30/2022.  ?Contacted [name] for General Review Call ? ? ?Chart Review: ? ?Have there been any documented new, changed, or discontinued medications since last visit? No  ?Has there been any documented recent hospitalizations or ED  visits since last visit with Clinical Pharmacist? Yes ?Brief Summary: Please review chart prep. ? ?Adherence Review: ? ?Does the Clinical Pharmacist Assistant have access to adherence rates? Yes ?Adherence rates for STAR metric medications: Not available ?Adherence rates for medications indicated for disease state being reviewed: Not  available ?Does the patient have >5 day gap between last estimated fill dates for any of the above medications or other medication gaps? No ?Reason for medication gaps: None ? ? ?Disease State Questions: ? ?Able to connect with Patient? Yes ? ?Did patient have any problems with their health recently? No ? ?Have you had any admissions or emergency room visits or worsening of your condition(s) since last visit? Yes ?Details of ED visit, hospital visit and/or worsening condition(s): Please view chart prep. ? ?Have you had any visits with new specialists or providers since your last visit? No ? ?Have you had any new health care problem(s) since your last visit? No ? ?Have you run out of any of your medications since you last spoke with clinical pharmacist? No ? ?Are there any medications you are not taking as prescribed? No ? ?Are you having any issues or side effects with your medications? No ? ?Do you have any other health concerns or questions you want to discuss with your Clinical Pharmacist before your next visit? No ? ?Are there any health concerns that you feel we can do a better job addressing? No ? ?Are you having any problems with any of the following since the last visit: (select all that apply) ? None ? ?12. Any falls since last visit? No ?  ?13. Any increased or uncontrolled pain since last visit? No ? ?14. Next visit Type: office ?      Visit with: [name], FNP ?       Date: 05-03-2022 ?       Time: 10:40 ? ?15. Additional Details? No  ? ?03-31-2022: Patient is on medication adherence list for non-compliance for metformin. Patient's medication was filled on 01-12-2022 for 90 DS. Updated medication adherence form. ? ?NOTES: ?[facility] stated [facility] and would like to stay with [facility]. Sent message to [place]onia Sours and [place] to update patient's PCP. ? ?Care Gaps: ?Covid booster overdue ?AWV 12-21-2022 ? ?Star Rating Drugs: ?Olmesartan 20 mg- Last filled 03-17-2022 90 DS  [facility] ?Metformin 500 mg- Last filled 01-12-2022 100 DS [facility] ? ?[name] CMA ?Clinical Pharmacist Assistant ?[phone] ? ?

## 2022-04-19 ENCOUNTER — Telehealth: Payer: Self-pay

## 2022-04-19 ENCOUNTER — Other Ambulatory Visit: Payer: Self-pay

## 2022-04-19 ENCOUNTER — Other Ambulatory Visit (HOSPITAL_COMMUNITY): Payer: Self-pay

## 2022-04-19 DIAGNOSIS — E119 Type 2 diabetes mellitus without complications: Secondary | ICD-10-CM

## 2022-04-19 MED ORDER — METFORMIN HCL 500 MG PO TABS
500.0000 mg | ORAL_TABLET | Freq: Every day | ORAL | 1 refills | Status: DC
  Filled 2022-04-19 – 2022-07-12 (×3): qty 90, 90d supply, fill #0
  Filled 2022-09-30 – 2022-10-11 (×2): qty 90, 90d supply, fill #1

## 2022-04-19 NOTE — Chronic Care Management (AMB) (Addendum)
    Chronic Care Management Pharmacy Assistant   Name: Allison Franco  MRN: [id number] DOB: Aug 25, 1941   Reason for Encounter: Med adherence    04-19-2022: Left patient a voicemail to clarify if she was still taking metformin. [facility] and was told medication was discontinued by PCP on 01-12-2022. Sent message to [name], [name] and [place] to clarify.  04-21-2022: PCP clarified patient was decreased from twice daily to once daily. Patient stated she picked up 2 bottles of metformin 500 mg twice daily with 200 pills in each on 01-11-2022 from [facility]. Patient stated she has 100 pills left in one bottle and a full bottle with 200 pills. Patient has been taking metformin 500 mg once daily.  Medications: Outpatient Encounter Medications as of 04/19/2022  Medication Sig   ACCU-CHEK GUIDE test strip TEST 3 TIMES DAILY   acetaminophen (TYLENOL) 500 MG tablet Take 1 tablet (500 mg total) by mouth every 6 (six) hours as needed for mild pain, moderate pain or headache. Reported on 05/10/2016   Alcohol Swabs (ALCOHOL WIPES) 70 % PADS by Does not apply route. Use with blood sugar check and injection of insulin   benzonatate (TESSALON) 100 MG capsule Take 1 capsule (100 mg total) by mouth 3 (three) times daily as needed for cough.   blood glucose meter kit and supplies KIT Dispense based on patient and insurance preference. Use up to four times daily as directed. (FOR ICD-9 250.00, 250.01).   Cholecalciferol (VITAMIN D3) 25 MCG (1000 UT) CAPS Take 1 capsule (1,000 Units total) by mouth daily.   cloNIDine (CATAPRES) 0.1 MG tablet Take 1 tablet by mouth daily if systolic blood pressure over 160. If remains elevated call to office   dorzolamide-timolol (COSOPT) 22.3-6.8 MG/ML ophthalmic solution Instill 1 drop into both eyes twice a day   ibuprofen (ADVIL) 400 MG tablet Take 1 tablet (400 mg total) by mouth every 8 (eight) hours as needed (pain).   MELATONIN PO  Take by mouth.   metFORMIN (GLUCOPHAGE) 500 MG tablet Take 1 tablet (500 mg total) by mouth daily with breakfast.   Multiple Vitamins-Minerals (CENTRUM SILVER 50+WOMEN) TABS Take 1 tablet by mouth daily.   olmesartan (BENICAR) 20 MG tablet Take 1 tablet (20 mg total) by mouth daily.   traZODone (DESYREL) 50 MG tablet Take 1 tablet (50 mg total) by mouth at bedtime.   Zoster Vaccine Adjuvanted [facility]) injection Inject 0.5 mLs into the muscle once for 1 dose. Administer 2nd dose in 2-6 months  Please fax when each dose administered [phone]   No facility-administered encounter medications on file as of 04/19/2022.    [place] Pharmacist Assistant [phone]

## 2022-04-27 ENCOUNTER — Other Ambulatory Visit (HOSPITAL_COMMUNITY): Payer: Self-pay

## 2022-04-27 ENCOUNTER — Ambulatory Visit: Payer: Medicare Other | Admitting: Nurse Practitioner

## 2022-04-28 ENCOUNTER — Other Ambulatory Visit (HOSPITAL_COMMUNITY): Payer: Self-pay

## 2022-04-28 ENCOUNTER — Other Ambulatory Visit: Payer: Self-pay | Admitting: Nurse Practitioner

## 2022-04-28 DIAGNOSIS — M25511 Pain in right shoulder: Secondary | ICD-10-CM

## 2022-04-29 ENCOUNTER — Other Ambulatory Visit: Payer: Self-pay

## 2022-04-29 ENCOUNTER — Other Ambulatory Visit (HOSPITAL_COMMUNITY): Payer: Self-pay

## 2022-04-29 MED ORDER — ACCU-CHEK GUIDE VI STRP: ORAL_STRIP | 3 refills | Status: DC

## 2022-04-29 MED ORDER — DICLOFENAC SODIUM 1 % EX GEL
2.0000 g | Freq: Four times a day (QID) | CUTANEOUS | 2 refills | Status: DC
  Filled 2022-04-29: qty 100, 13d supply, fill #0
  Filled 2022-06-17 (×2): qty 100, 13d supply, fill #1

## 2022-05-03 ENCOUNTER — Other Ambulatory Visit (HOSPITAL_COMMUNITY): Payer: Self-pay

## 2022-05-03 ENCOUNTER — Encounter: Payer: Self-pay | Admitting: Nurse Practitioner

## 2022-05-03 ENCOUNTER — Ambulatory Visit (INDEPENDENT_AMBULATORY_CARE_PROVIDER_SITE_OTHER): Payer: Medicare Other | Admitting: Nurse Practitioner

## 2022-05-03 VITALS — BP 132/70 | HR 73 | Temp 98.2°F | Ht 63.0 in | Wt 174.0 lb

## 2022-05-03 DIAGNOSIS — Z6830 Body mass index (BMI) 30.0-30.9, adult: Secondary | ICD-10-CM

## 2022-05-03 DIAGNOSIS — G8929 Other chronic pain: Secondary | ICD-10-CM

## 2022-05-03 DIAGNOSIS — M25561 Pain in right knee: Secondary | ICD-10-CM

## 2022-05-03 DIAGNOSIS — I700 Atherosclerosis of aorta: Secondary | ICD-10-CM

## 2022-05-03 DIAGNOSIS — K0889 Other specified disorders of teeth and supporting structures: Secondary | ICD-10-CM | POA: Diagnosis not present

## 2022-05-03 DIAGNOSIS — E6609 Other obesity due to excess calories: Secondary | ICD-10-CM

## 2022-05-03 DIAGNOSIS — I119 Hypertensive heart disease without heart failure: Secondary | ICD-10-CM

## 2022-05-03 DIAGNOSIS — F5101 Primary insomnia: Secondary | ICD-10-CM

## 2022-05-03 DIAGNOSIS — E669 Obesity, unspecified: Secondary | ICD-10-CM

## 2022-05-03 DIAGNOSIS — M25562 Pain in left knee: Secondary | ICD-10-CM | POA: Diagnosis not present

## 2022-05-03 DIAGNOSIS — I10 Essential (primary) hypertension: Secondary | ICD-10-CM | POA: Diagnosis not present

## 2022-05-03 DIAGNOSIS — E1169 Type 2 diabetes mellitus with other specified complication: Secondary | ICD-10-CM | POA: Diagnosis not present

## 2022-05-03 MED ORDER — OLMESARTAN MEDOXOMIL 20 MG PO TABS
20.0000 mg | ORAL_TABLET | Freq: Every day | ORAL | 1 refills | Status: DC
  Filled 2022-05-03 – 2022-06-15 (×2): qty 90, 90d supply, fill #0

## 2022-05-03 NOTE — Patient Instructions (Addendum)

## 2022-05-03 NOTE — Progress Notes (Signed)
I,Tianna Badgett,acting as a Education administrator for [employer], FNP.,have documented all relevant documentation on the behalf of [name], FNP,as directed by  [name], FNP while in the presence of [name], Melissa.  This visit occurred during the SARS-CoV-2 public health emergency.  Safety protocols were in place, including screening questions prior to the visit, additional usage of staff PPE, and extensive cleaning of exam room while observing appropriate contact time as indicated for disinfecting solutions.  Subjective:     Patient ID: Allison Franco , female    DOB: Apr 25, 1941 , 81 y.o.   MRN: [id number]   Chief Complaint  Patient presents with   Diabetes   Hypertension    HPI  Patient presents today for a diabetes and blood pressure f/u  Diabetes She presents for her follow-up diabetic visit. She has type 2 diabetes mellitus. Pertinent negatives for hypoglycemia include no dizziness or headaches. There are no diabetic associated symptoms. Pertinent negatives for diabetes include no chest pain, no polydipsia, no polyphagia and no polyuria. There are no hypoglycemic complications. Symptoms are stable. There are no diabetic complications. Risk factors for coronary artery disease include sedentary lifestyle and obesity. When asked about current treatments, none were reported. She is following a generally healthy diet. When asked about meal planning, she reported none. She has not had a previous visit with a dietitian. She rarely participates in exercise. (Blood sugar this morning was 101. ) An ACE inhibitor/angiotensin II receptor blocker is being taken. She does not see a podiatrist.Eye exam is not current.  Hypertension This is a chronic problem. The current episode started more than 1 year ago. The problem is controlled. Pertinent negatives include no chest pain, headaches, palpitations or shortness of breath. There are no associated agents to hypertension. Past treatments include angiotensin  blockers. There are no compliance problems.  There is no history of angina. There is no history of chronic renal disease.     Past Medical History:  Diagnosis Date   Anxiety    Constipation    Depression    "following husband's death"   Diabetes mellitus without complication ([place])    Hypertension    KNEE PAIN, BILATERAL 02/13/2009   Qualifier: Diagnosis of  By: Hassell Done FNP, Tori Milks     Tinnitus      Family History  Problem Relation Age of Onset   Diabetes Mother    Hypertension Father    Breast cancer Neg Hx      Current Outpatient Medications:    acetaminophen (TYLENOL) 500 MG tablet, Take 1 tablet (500 mg total) by mouth every 6 (six) hours as needed for mild pain, moderate pain or headache. Reported on 05/10/2016, Disp: 30 tablet, Rfl: 0   Alcohol Swabs (ALCOHOL WIPES) 70 % PADS, by Does not apply route. Use with blood sugar check and injection of insulin, Disp: , Rfl:    benzonatate (TESSALON) 100 MG capsule, Take 1 capsule (100 mg total) by mouth 3 (three) times daily as needed for cough., Disp: 21 capsule, Rfl: 0   blood glucose meter kit and supplies KIT, Dispense based on patient and insurance preference. Use up to four times daily as directed. (FOR ICD-9 250.00, 250.01)., Disp: 1 each, Rfl: 0   Cholecalciferol (VITAMIN D3) 25 MCG (1000 UT) CAPS, Take 1 capsule (1,000 Units total) by mouth daily., Disp: 60 capsule, Rfl: 1   cloNIDine (CATAPRES) 0.1 MG tablet, Take 1 tablet by mouth daily if systolic blood pressure over 160. If remains elevated call to office,  Disp: 90 tablet, Rfl: 1   diclofenac Sodium (VOLTAREN) 1 % GEL, Apply 2 g topically 4 (four) times daily., Disp: 100 g, Rfl: 2   dorzolamide-timolol (COSOPT) 22.3-6.8 MG/ML ophthalmic solution, Instill 1 drop into both eyes twice a day, Disp: 10 mL, Rfl: 11   glucose blood (ACCU-CHEK GUIDE) test strip, TEST 3 TIMES DAILY, Disp: 100 strip, Rfl: 3   ibuprofen (ADVIL) 400 MG tablet, Take 1 tablet (400 mg total) by mouth  every 8 (eight) hours as needed (pain)., Disp: 15 tablet, Rfl: 0   MELATONIN PO, Take by mouth., Disp: , Rfl:    metFORMIN (GLUCOPHAGE) 500 MG tablet, Take 1 tablet (500 mg total) by mouth daily with breakfast., Disp: 90 tablet, Rfl: 1   Multiple Vitamins-Minerals (CENTRUM SILVER 50+WOMEN) TABS, Take 1 tablet by mouth daily., Disp: , Rfl:    olmesartan (BENICAR) 20 MG tablet, Take 1 tablet (20 mg total) by mouth daily., Disp: 90 tablet, Rfl: 1   traZODone (DESYREL) 50 MG tablet, Take 1 tablet (50 mg total) by mouth at bedtime., Disp: 30 tablet, Rfl: 2   Zoster Vaccine Adjuvanted (SHINGRIX) injection, Inject 0.5 mLs into the muscle once for 1 dose. Administer 2nd dose in 2-6 months  Please fax when each dose administered [phone], Disp: 0.5 mL, Rfl: 1   Allergies  Allergen Reactions   Aspirin Other (See Comments)    insomnia   Hydroxyzine Other (See Comments)    insomnia   Valium [Diazepam] Other (See Comments)    Insomnia, 05/10/16 pt states she took as young person- made her depressed     Review of Systems  Constitutional: Negative.   HENT:         She is having tooth pain to her lower tooth  and would like to preserve the tooth.  Respiratory: Negative.  Negative for shortness of breath.   Cardiovascular: Negative.  Negative for chest pain and palpitations.  Gastrointestinal: Negative.   Endocrine: Negative for polydipsia, polyphagia and polyuria.  Musculoskeletal:  Positive for arthralgias.  Neurological: Negative.  Negative for dizziness and headaches.  Psychiatric/Behavioral: Negative.       Today's Vitals   05/03/22 0941  BP: 132/70  Pulse: 73  Temp: 98.2 F (36.8 C)  TempSrc: Oral  Weight: 174 lb (78.9 kg)  Height: 5' 3" (1.6 m)   Body mass index is 30.82 kg/m.  Wt Readings from Last 3 Encounters:  05/03/22 174 lb (78.9 kg)  01/24/22 174 lb 12.8 oz (79.3 kg)  12/15/21 180 lb (81.6 kg)    Objective:  Physical Exam Vitals reviewed.  Constitutional:       General: She is not in acute distress.    Appearance: Normal appearance. She is obese.  HENT:     Mouth/Throat:     Mouth: Mucous membranes are moist.     Pharynx: Oropharynx is clear.      Comments: Tooth is loose Cardiovascular:     Rate and Rhythm: Normal rate and regular rhythm.     Pulses: Normal pulses.     Heart sounds: Normal heart sounds. No murmur heard. Pulmonary:     Effort: Pulmonary effort is normal. No respiratory distress.     Breath sounds: Normal breath sounds. No wheezing.  Musculoskeletal:        General: Tenderness: mild crepitus noted to lateral knee.  Skin:    General: Skin is warm and dry.     Capillary Refill: Capillary refill takes less than 2 seconds.  Neurological:  General: No focal deficit present.     Mental Status: She is alert and oriented to person, place, and time.     Cranial Nerves: No cranial nerve deficit.     Motor: No weakness.  Psychiatric:        Mood and Affect: Mood normal. Mood is not anxious.        Behavior: Behavior normal.        Thought Content: Thought content normal.        Judgment: Judgment normal.         Assessment And Plan:     1. Diabetes mellitus type 2 in obese [facility]) Comments: Stable, continue current medications - Lipid panel - Hemoglobin A1c  2. Hypertensive heart disease without heart failure Comments: Blood pressure is controlled, continue current medicaitons. She has not had to use the clonidine - BMP8+EGFR - olmesartan (BENICAR) 20 MG tablet; Take 1 tablet (20 mg total) by mouth daily.  Dispense: 90 tablet; Refill: 1 - BMP8+eGFR  3. Primary insomnia Comments: Stable, no changes.   4. Aortic atherosclerosis (HCC) Comments: Continue statin, tolerating well   5. Tooth pain Comments: Tooth is loose and I explained this may not be repairable.  - Ambulatory referral to Dentistry  6. Chronic pain of both knees Comments: Advised to call Dr. Durward Fortes (orthopedics) for follow up, she continues to  have knee pain. Had injections done in January   7. Class 1 obesity due to excess calories with serious comorbidity and body mass index (BMI) of 30.0 to 30.9 in adult  She is encouraged to strive for BMI less than 30 to decrease cardiac risk. Advised to aim for at least 150 minutes of exercise per week.    Patient was given opportunity to ask questions. Patient verbalized understanding of the plan and was able to repeat key elements of the plan. All questions were answered to their satisfaction.  [name], FNP    I, [name], FNP, have reviewed all documentation for this visit. The documentation on 05/03/22 for the exam, diagnosis, procedures, and orders are all accurate and complete.   IF YOU HAVE BEEN REFERRED TO A SPECIALIST, IT MAY TAKE 1-2 WEEKS TO SCHEDULE/PROCESS THE REFERRAL. IF YOU HAVE NOT HEARD FROM US/SPECIALIST IN TWO WEEKS, PLEASE GIVE [place] A CALL AT [phone] X [phone].   THE PATIENT IS ENCOURAGED TO PRACTICE SOCIAL DISTANCING DUE TO THE COVID-19 PANDEMIC.

## 2022-05-04 LAB — BMP8+EGFR
BUN/Creatinine Ratio: 17 (ref 12–28)
BUN: 14 mg/dL (ref 8–27)
CO2: 21 mmol/L (ref 20–29)
Calcium: 9 mg/dL (ref 8.7–10.3)
Chloride: 102 mmol/L (ref 96–106)
Creatinine, Ser: 0.82 mg/dL (ref 0.57–1.00)
Glucose: 136 mg/dL — ABNORMAL HIGH (ref 70–99)
Potassium: 4.5 mmol/L (ref 3.5–5.2)
Sodium: 140 mmol/L (ref 134–144)
eGFR: 72 mL/min/{1.73_m2} (ref >59)

## 2022-05-04 LAB — HEMOGLOBIN A1C
Est. average glucose Bld gHb Est-mCnc: 117 mg/dL
Hgb A1c MFr Bld: 5.7 % — ABNORMAL HIGH (ref 4.8–5.6)

## 2022-05-04 LAB — LIPID PANEL
Chol/HDL Ratio: 4 ratio (ref 0.0–4.4)
Cholesterol, Total: 160 mg/dL (ref 100–199)
HDL: 40 mg/dL (ref >39)
LDL Chol Calc (NIH): 92 mg/dL (ref 0–99)
Triglycerides: 161 mg/dL — ABNORMAL HIGH (ref 0–149)
VLDL Cholesterol Cal: 28 mg/dL (ref 5–40)

## 2022-05-05 ENCOUNTER — Telehealth: Payer: Medicare Other

## 2022-05-05 ENCOUNTER — Other Ambulatory Visit (HOSPITAL_COMMUNITY): Payer: Self-pay

## 2022-05-10 ENCOUNTER — Telehealth: Payer: Medicare Other

## 2022-05-18 ENCOUNTER — Encounter: Payer: Self-pay | Admitting: Orthopaedic Surgery

## 2022-05-18 ENCOUNTER — Ambulatory Visit (INDEPENDENT_AMBULATORY_CARE_PROVIDER_SITE_OTHER): Payer: Medicare Other | Admitting: Orthopaedic Surgery

## 2022-05-18 DIAGNOSIS — M1712 Unilateral primary osteoarthritis, left knee: Secondary | ICD-10-CM

## 2022-05-18 MED ORDER — BUPIVACAINE HCL 0.25 % IJ SOLN
2.0000 mL | INTRAMUSCULAR | Status: AC | PRN
  Administered 2022-05-18: 2 mL via INTRA_ARTICULAR

## 2022-05-18 MED ORDER — METHYLPREDNISOLONE ACETATE 40 MG/ML IJ SUSP
80.0000 mg | INTRAMUSCULAR | Status: AC | PRN
  Administered 2022-05-18: 80 mg via INTRA_ARTICULAR

## 2022-05-18 MED ORDER — LIDOCAINE HCL 1 % IJ SOLN
2.0000 mL | INTRAMUSCULAR | Status: AC | PRN
  Administered 2022-05-18: 2 mL

## 2022-05-18 NOTE — Progress Notes (Signed)
Office Visit Note   Patient: Allison Franco           Date of Birth: Jan 27, 1941           MRN: [id number] Visit Date: 05/18/2022              Requested by: [name], [place] [address] [place],  [place] [address] PCP: [name], FNP   Assessment & Plan: Visit Diagnoses:  1. Unilateral primary osteoarthritis, left knee     Plan: Ms. Hook is a pleasant 81 year old woman with a known history of bilateral tricompartmental arthritis of her knees.  In January she was given a cortisone injection into the right knee which still continues to help her.  She is having increased pain on her left knee.  No known injury.  We will go forward with a steroid injection into the left knee.  She is a diabetic but her hemoglobin A1c is under 6.  She may follow-up as needed  Follow-Up Instructions:    Orders:  No orders of the defined types were placed in this encounter.  No orders of the defined types were placed in this encounter.     Procedures: Large Joint Inj: L knee on 05/18/2022 1:23 PM Indications: pain and diagnostic evaluation Details: 25 G 1.5 in needle, anteromedial approach  Arthrogram: No  Medications: 80 mg methylPREDNISolone acetate 40 MG/ML; 2 mL lidocaine 1 %; 2 mL bupivacaine 0.25 % Outcome: tolerated well, no immediate complications Procedure, treatment alternatives, risks and benefits explained, specific risks discussed. Consent was given by the patient.      Clinical Data: No additional findings.   Subjective: Chief Complaint  Patient presents with   Right Knee - Pain   Left Knee - Pain  Patient presents today for bilateral knee pain. She was here in January of this year and had both knees injected with cortisone. She said that her left hurts more than the right. She is taking Tylenol daily for pain relief. She injections did help, but unsure if she needs more today or not.     Review of Systems  All other systems reviewed and are  negative.    Objective: Vital Signs: There were no vitals taken for this visit.  Physical Exam Constitutional:      Appearance: Normal appearance.  Pulmonary:     Effort: Pulmonary effort is normal.  Skin:    General: Skin is warm and dry.  Neurological:     Mental Status: She is alert.     Ortho Exam Examination of her left knee no effusion no warmth.  She does have medial joint pain greater than lateral also some patellofemoral symptoms with decreased motion of the patellofemoral joint good varus valgus stability no redness no erythema no cellulitis distal sensation is intact Specialty Comments:  No specialty comments available.  Imaging: No results found.   PMFS History: Patient Active Problem List   Diagnosis Date Noted   Unilateral primary osteoarthritis, left knee 05/18/2022   Primary open angle glaucoma (POAG) of both eyes, severe stage 07/16/2020   Inguinal hernia with bowel obstruction 08/07/2019   Incarcerated left inguinal hernia 08/07/2019   Anemia 08/31/2018   Essential hypertension 07/03/2018   Type 2 diabetes mellitus without complication, without long-term current use of insulin ([place]) 07/02/2018   Obesity 10/20/2013   ALLERGIC RHINITIS 08/11/2009   INSOMNIA 07/17/2008   Past Medical History:  Diagnosis Date   Anxiety    Constipation    Depression    "  following husband's death"   Diabetes mellitus without complication ([place])    Hypertension    KNEE PAIN, BILATERAL 02/13/2009   Qualifier: Diagnosis of  By: Hassell Done FNP, Tori Milks     Tinnitus     Family History  Problem Relation Age of Onset   Diabetes Mother    Hypertension Father    Breast cancer Neg Hx     Past Surgical History:  Procedure Laterality Date   ABDOMINAL HYSTERECTOMY     COLON RESECTION N/A 08/07/2019   Procedure: DIAGNOSTIC LAPAROSCOPY WITH OPEN LEFT INGUINAL HERNIA WITH MESH;  Surgeon: Johnathan Hausen, MD;  Location: [facility];  Service: General;  Laterality: N/A;   fribroid  surgery     Social History   Occupational History   Occupation: employed  Tobacco Use   Smoking status: Never   Smokeless tobacco: Never  Vaping Use   Vaping Use: Never used  Substance and Sexual Activity   Alcohol use: No   Drug use: No   Sexual activity: Not on file

## 2022-05-19 ENCOUNTER — Telehealth: Payer: Medicare Other

## 2022-05-19 ENCOUNTER — Ambulatory Visit (INDEPENDENT_AMBULATORY_CARE_PROVIDER_SITE_OTHER): Payer: Medicare Other

## 2022-05-19 DIAGNOSIS — G8929 Other chronic pain: Secondary | ICD-10-CM

## 2022-05-19 DIAGNOSIS — I119 Hypertensive heart disease without heart failure: Secondary | ICD-10-CM

## 2022-05-19 DIAGNOSIS — I700 Atherosclerosis of aorta: Secondary | ICD-10-CM

## 2022-05-19 DIAGNOSIS — E1169 Type 2 diabetes mellitus with other specified complication: Secondary | ICD-10-CM

## 2022-05-19 NOTE — Chronic Care Management (AMB) (Cosign Needed)
Chronic Care Management   CCM RN Visit Note  05/19/2022 Name: Allison Franco MRN: [id number] DOB: 16-Jun-1941  Subjective: Allison Franco is a 81 y.o. year old female who is a primary care patient of [name], [place]. The care management team was consulted for assistance with disease management and care coordination needs.    Engaged with patient by telephone for follow up visit in response to provider referral for case management and/or care coordination services.   Consent to Services:  The patient was given information about Chronic Care Management services, agreed to services, and gave verbal consent prior to initiation of services.  Please see initial visit note for detailed documentation.   Patient agreed to services and verbal consent obtained.   Assessment: Review of patient past medical history, allergies, medications, health status, including review of consultants reports, laboratory and other test data, was performed as part of comprehensive evaluation and provision of chronic care management services.   SDOH (Social Determinants of Health) assessments and interventions performed:  Yes, no acute needs  CCM Care Plan  Allergies  Allergen Reactions   Aspirin Other (See Comments)    insomnia   Hydroxyzine Other (See Comments)    insomnia   Valium [Diazepam] Other (See Comments)    Insomnia, 05/10/16 pt states she took as young person- made her depressed    Outpatient Encounter Medications as of 05/19/2022  Medication Sig   acetaminophen (TYLENOL) 500 MG tablet Take 1 tablet (500 mg total) by mouth every 6 (six) hours as needed for mild pain, moderate pain or headache. Reported on 05/10/2016   Alcohol Swabs (ALCOHOL WIPES) 70 % PADS by Does not apply route. Use with blood sugar check and injection of insulin   benzonatate (TESSALON) 100 MG capsule Take 1 capsule (100 mg total) by mouth 3 (three) times daily as needed for cough.   blood glucose meter kit and supplies KIT Dispense  based on patient and insurance preference. Use up to four times daily as directed. (FOR ICD-9 250.00, 250.01).   Cholecalciferol (VITAMIN D3) 25 MCG (1000 UT) CAPS Take 1 capsule (1,000 Units total) by mouth daily.   cloNIDine (CATAPRES) 0.1 MG tablet Take 1 tablet by mouth daily if systolic blood pressure over 160. If remains elevated call to office   diclofenac Sodium (VOLTAREN) 1 % GEL Apply 2 g topically 4 (four) times daily.   dorzolamide-timolol (COSOPT) 22.3-6.8 MG/ML ophthalmic solution Instill 1 drop into both eyes twice a day   glucose blood (ACCU-CHEK GUIDE) test strip TEST 3 TIMES DAILY   ibuprofen (ADVIL) 400 MG tablet Take 1 tablet (400 mg total) by mouth every 8 (eight) hours as needed (pain).   MELATONIN PO Take by mouth.   metFORMIN (GLUCOPHAGE) 500 MG tablet Take 1 tablet (500 mg total) by mouth daily with breakfast.   Multiple Vitamins-Minerals (CENTRUM SILVER 50+WOMEN) TABS Take 1 tablet by mouth daily.   olmesartan (BENICAR) 20 MG tablet Take 1 tablet (20 mg total) by mouth daily.   traZODone (DESYREL) 50 MG tablet Take 1 tablet (50 mg total) by mouth at bedtime.   Zoster Vaccine Adjuvanted [facility]) injection Inject 0.5 mLs into the muscle once for 1 dose. Administer 2nd dose in 2-6 months  Please fax when each dose administered [phone]   No facility-administered encounter medications on file as of 05/19/2022.    Patient Active Problem List   Diagnosis Date Noted   Unilateral primary osteoarthritis, left knee 05/18/2022   Primary open angle glaucoma (POAG) of  both eyes, severe stage 07/16/2020   Inguinal hernia with bowel obstruction 08/07/2019   Incarcerated left inguinal hernia 08/07/2019   Anemia 08/31/2018   Essential hypertension 07/03/2018   Type 2 diabetes mellitus without complication, without long-term current use of insulin ([place]) 07/02/2018   Obesity 10/20/2013   ALLERGIC RHINITIS 08/11/2009   INSOMNIA 07/17/2008    Conditions to be  addressed/monitored: DM II, HTN, 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, Aortic atherosclerosis  Care Plan : RN Care Manager Plan of Care  Updates made by Lynne Logan, RN since 05/19/2022 12:00 AM     Problem: No plan of care established for chronic disease states (DM II, HTN, 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, Aortic atherosclerosis)   Priority: High     Long-Range Goal: Development of plan of care for chronic disease management (DM II, HTN, 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, Aortic atherosclerosis)   Start Date: 10/26/2021  Expected End Date: 10/26/2022  Recent Progress: On track  Priority: High  Note:   Current Barriers:  Knowledge Deficits related to plan of care for management of DM II, HTN, 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, Aortic atherosclerosis  Chronic Disease Management support and education needs related to DM II, HTN, 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, Aortic atherosclerosis   RNCM Clinical Goal(s):  Patient will verbalize basic understanding of  DM II, HTN, 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, Aortic atherosclerosis disease process and self health management plan as evidenced by patient will report having no disease exacerbations related to chronic disease states listed above take all medications exactly as prescribed and will call provider for medication related questions as evidenced by patient will report having no missed doses of her prescribed medications demonstrate Ongoing health management independence as evidenced by patient will remain to be independent with self care continue to work with RN Care Manager to address care management and care coordination needs related to  DM II, HTN, 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   Interventions: 1:1 collaboration with primary care provider regarding  development and update of comprehensive plan of care as evidenced by provider attestation and co-signature Inter-disciplinary care team collaboration (see longitudinal plan of care) Evaluation of current treatment plan related to  self management and patient's adherence to plan as established by provider  Prediabetes Interventions:  (Status:  New goal.) Long Term Goal Assessed patient's understanding of A1c goal:  <5.7 Educated patient on dietary and exercise recommendations  Reviewed medications with patient and discussed importance of medication adherence Counseled on importance of regular laboratory monitoring as prescribed Discussed plans with patient for ongoing care management follow up and provided patient with direct contact information for care management team Assessed social determinant of health barriers Mailed printed educational materials related to Chair Exercises, Carb Choice list and Elderly Nutrition 101 Lab Results  Component Value Date   HGBA1C 5.7 (H) 05/03/2022  Hypertension Interventions:  (Status:  Goal Met.) Long Term Goal Last practice recorded BP readings:  BP Readings from Last 3 Encounters:  05/03/22 132/70  02/06/22 134/75  01/24/22 128/68  Most recent eGFR/CrCl:  Lab Results  Component Value Date   EGFR 72 05/03/2022    No components found for: "CRCL"  Evaluation of current treatment plan related to hypertension self management and patient's adherence to plan as established by provider  Reviewed medications with patient and discussed importance of compliance Counseled on the importance of exercise goals with target of 150 minutes per week Provided education on prescribed diet low Sodium Discussed complications of poorly controlled blood pressure such as heart disease, stroke, circulatory complications, vision complications, kidney impairment, sexual dysfunction  Discussed plans with patient for ongoing care management follow up and provided patient with  direct contact information for care management team  Hyperlipidemia Interventions:  (Status:  Goal Met.) Long Term Goal Medication review performed; medication list updated in electronic medical record.  Provider established cholesterol goals reviewed Counseled on importance of regular laboratory monitoring as prescribed Reviewed importance of limiting foods high in cholesterol Reviewed exercise goals and target of 150 minutes per week Discussed plans with patient for ongoing care management follow up and provided patient with direct contact information for care management team Lipid Panel     Component Value Date/Time   CHOL 160 05/03/2022 1014   TRIG 161 (H) 05/03/2022 1014   HDL 40 05/03/2022 1014   CHOLHDL 4.0 05/03/2022 1014   CHOLHDL 4.5 07/04/2018 0327   VLDL 25 07/04/2018 0327   LDLCALC 92 05/03/2022 1014   LABVLDL 28 05/03/2022 1014    Lipid Panel     Component Value Date/Time   CHOL 156 12/08/2021 1024   TRIG 141 12/08/2021 1024   HDL 38 (L) 12/08/2021 1024   CHOLHDL 4.1 12/08/2021 1024   CHOLHDL 4.5 07/04/2018 0327   VLDL 25 07/04/2018 0327   LDLCALC 93 12/08/2021 1024   LABVLDL 25 12/08/2021 1024     Pain Interventions:  (Status:  Goal Met.) Long Term Goal Pain assessment performed Medications reviewed Reviewed provider established plan for pain management Determined patient follow up with Orthopedics, she received a steroid injection to her left knee Educated patient on non-pharmacologic interventions such as applying moist heat and or peppermint essential oil to the affected extremity as needed  Counseled on the importance of reporting any/all new or changed pain symptoms or management strategies to pain management provider Advised patient to report to care team affect of pain on daily activities Discussed plans with patient for ongoing care management follow up and provided patient with direct contact information for care management team  Acute Upper  Respiratory Infection:  (Status:  Goal Met.)  Short Term Goal Evaluation of current treatment plan related to  Acute Upper Respiratory Infection , self-management and patient's adherence to plan as established by provider Determined patient experienced and ED visit in mid March for cough and congestion Review of patient status, including review of consultant's reports, relevant laboratory and other test results, and medications completed Discussed patient's symptoms have resolved, patient feels symptoms were related to an allergy from fresh flowers, her symptoms resolved after removing the fresh flowers from her room Educated on signs/symptoms suggestive of seasonal allergies and instructed patient to notify PCP of new symptoms that may arise Educated patient on the importance of staying well hydrated with water and keeping all scheduled provider follow up appointments Discussed plans with patient for ongoing care management follow up and provided patient with direct contact information for care management team  Patient Goals/Self-Care Activities: Take all medications as prescribed Attend all scheduled provider appointments Call pharmacy for medication refills 3-7 days in advance of running out of medications Attend church or other social activities Perform all self care activities independently  Perform IADL's (shopping, preparing meals, housekeeping, managing finances) independently Call provider office for new concerns or questions  drink 6 to  8 glasses of water each day eat fish at least once per week fill half of plate with vegetables manage portion size take medications for blood pressure exactly as prescribed adhere to prescribed diet: low Sodium, low fat develop an exercise routine  Follow Up Plan:  No further follow up required: patient's chronic health conditions are stable and no further RN CM follow up is needed at this time      Barb Merino, RN, BSN, CCM Care Management  Coordinator [facility] [facility]  Direct Phone: [phone]

## 2022-05-19 NOTE — Patient Instructions (Signed)
Visit Information  Thank you for taking time to visit with me today. Please don't hesitate to contact me if I can be of assistance to you before our next scheduled telephone appointment.  Following are the goals we discussed today:  Take all medications as prescribed Attend all scheduled provider appointments Call pharmacy for medication refills 3-7 days in advance of running out of medications Attend church or other social activities Perform all self care activities independently  Perform IADL's (shopping, preparing meals, housekeeping, managing finances) independently Call provider office for new concerns or questions  drink 6 to 8 glasses of water each day eat fish at least once per week fill half of plate with vegetables manage portion size take medications for blood pressure exactly as prescribed adhere to prescribed diet: low Sodium, low fat develop an exercise routine  If you are experiencing a Mental Health or [place] or need someone to talk to, please call 1-800-273-TALK (toll free, 24 hour hotline)   Patient verbalizes understanding of instructions and care plan provided today and agrees to view in [place]. Active MyChart status and patient understanding of how to access instructions and care plan via MyChart confirmed with patient.     Barb Merino, RN, BSN, CCM Care Management Coordinator [facility] [facility]  Direct Phone: [phone]

## 2022-05-27 DIAGNOSIS — E1159 Type 2 diabetes mellitus with other circulatory complications: Secondary | ICD-10-CM | POA: Diagnosis not present

## 2022-05-27 DIAGNOSIS — I10 Essential (primary) hypertension: Secondary | ICD-10-CM

## 2022-06-03 ENCOUNTER — Telehealth: Payer: Self-pay

## 2022-06-03 NOTE — Chronic Care Management (AMB) (Signed)
Chronic Care Management Pharmacy Assistant   Name: Allison Franco  MRN: [id number] DOB: 1941/03/19  Reason for Encounter: Disease State/ Diabetes  Recent office visits:  05-19-2022 Little, [name], RN (CCM).  05-03-2022 [name], [place]. Glucose= 136. A1C= 5.7. TRIG= 161. Referral placed to dentistry.  Recent consult visits:  05-18-2022 Garald Balding, MD (orthopedic surgery). Steroid injection given in left knee.  Hospital visits:  Medication Reconciliation was completed by comparing discharge summary, patient's EMR and Pharmacy list, and upon discussion with patient.   Admitted to the hospital on 02-06-2022 due to Viral URI with cough. Discharge date was 02-06-2022. Discharged from [facility].    New?Medications Started at [facility] Discharge:?? Benzonatate 100 mg 3 times daily as needed Advil 400 mg every 8 hours as needed   Medication Changes at Hospital Discharge: None   Medications Discontinued at Hospital Discharge: None   Medications that remain the same after Hospital Discharge:??  -All other medications will remain the same.     Hospital visits:  Medication Reconciliation was completed by comparing discharge summary, patient's EMR and Pharmacy list, and upon discussion with patient.   Admitted to the hospital on 01-13-2022 due to Primary insomnia. Discharge date was 01-13-2022. Discharged from [facility] urgent care.   New?Medications Started at [facility] Discharge:?? Benadryl 25 mg take 1-2 tablets nightly.   Medication Changes at Hospital Discharge: None   Medications Discontinued at Hospital Discharge: None   Medications that remain the same after Hospital Discharge:??  -All other medications will remain the same.    Medications: Outpatient Encounter Medications as of 06/03/2022  Medication Sig   acetaminophen (TYLENOL) 500 MG tablet Take 1 tablet (500 mg total) by mouth every 6 (six) hours as needed for mild pain, moderate pain or  headache. Reported on 05/10/2016   Alcohol Swabs (ALCOHOL WIPES) 70 % PADS by Does not apply route. Use with blood sugar check and injection of insulin   benzonatate (TESSALON) 100 MG capsule Take 1 capsule (100 mg total) by mouth 3 (three) times daily as needed for cough.   blood glucose meter kit and supplies KIT Dispense based on patient and insurance preference. Use up to four times daily as directed. (FOR ICD-9 250.00, 250.01).   Cholecalciferol (VITAMIN D3) 25 MCG (1000 UT) CAPS Take 1 capsule (1,000 Units total) by mouth daily.   cloNIDine (CATAPRES) 0.1 MG tablet Take 1 tablet by mouth daily if systolic blood pressure over 160. If remains elevated call to office   diclofenac Sodium (VOLTAREN) 1 % GEL Apply 2 g topically 4 (four) times daily.   dorzolamide-timolol (COSOPT) 22.3-6.8 MG/ML ophthalmic solution Instill 1 drop into both eyes twice a day   glucose blood (ACCU-CHEK GUIDE) test strip TEST 3 TIMES DAILY   ibuprofen (ADVIL) 400 MG tablet Take 1 tablet (400 mg total) by mouth every 8 (eight) hours as needed (pain).   MELATONIN PO Take by mouth.   metFORMIN (GLUCOPHAGE) 500 MG tablet Take 1 tablet (500 mg total) by mouth daily with breakfast.   Multiple Vitamins-Minerals (CENTRUM SILVER 50+WOMEN) TABS Take 1 tablet by mouth daily.   olmesartan (BENICAR) 20 MG tablet Take 1 tablet (20 mg total) by mouth daily.   traZODone (DESYREL) 50 MG tablet Take 1 tablet (50 mg total) by mouth at bedtime.   Zoster Vaccine Adjuvanted [facility]) injection Inject 0.5 mLs into the muscle once for 1 dose. Administer 2nd dose in 2-6 months  Please fax when each dose administered [phone]  No facility-administered encounter medications on file as of 06/03/2022.  Recent Relevant Labs: Lab Results  Component Value Date/Time   HGBA1C 5.7 (H) 05/03/2022 10:14 AM   HGBA1C 5.7 (H) 12/08/2021 10:24 AM   MICROALBUR 10 01/20/2021 12:36 PM    Kidney Function Lab Results  Component Value Date/Time    CREATININE 0.82 05/03/2022 10:14 AM   CREATININE 0.84 12/08/2021 10:24 AM   GFRNONAA >60 07/26/2021 04:10 PM   GFRAA 78 11/11/2020 12:32 PM    Current antihyperglycemic regimen:  Metformin 500 mg daily  What recent interventions/DTPs have been made to improve glycemic control:  Educated on A1c and blood sugar goals; Complications of diabetes including kidney damage, retinal damage, and cardiovascular disease; -Counseled to check feet daily and get yearly eye exams -Recommended to continue current medication  Have there been any recent hospitalizations or ED visits since last visit with CPP? Yes  Patient denies hypoglycemic symptoms  Patient denies hyperglycemic symptoms  How often are you checking your blood sugar? once daily  What are your blood sugars ranging?  Fasting: 106, 99, 120 Before meals: None After meals: None Bedtime: None During the week, how often does your blood glucose drop below 70? Never  Are you checking your feet daily/regularly? Daily  Adherence Review: Is the patient currently on a STATIN medication? No Is the patient currently on ACE/ARB medication? Yes Does the patient have >5 day gap between last estimated fill dates? No   Care Gaps: Covid booster overdue AWV 12-21-2022  Star Rating Drugs: Olmesartan 20 mg- Last filled 03-17-2022 90 DS [facility] Metformin 500 mg- Last filled 01-12-2022 100 DS [facility] (Patient has plenty of supply left)  [place] Clinical Pharmacist Assistant [phone]

## 2022-06-07 ENCOUNTER — Other Ambulatory Visit: Payer: Self-pay

## 2022-06-07 ENCOUNTER — Emergency Department (HOSPITAL_COMMUNITY): Payer: Medicare Other

## 2022-06-07 ENCOUNTER — Emergency Department (HOSPITAL_COMMUNITY)
Admission: EM | Admit: 2022-06-07 | Discharge: 2022-06-07 | Disposition: A | Discharge status: Home / Self Care | Payer: Medicare Other | Attending: Emergency Medicine | Admitting: Emergency Medicine

## 2022-06-07 ENCOUNTER — Encounter (HOSPITAL_COMMUNITY): Payer: Self-pay | Admitting: Emergency Medicine

## 2022-06-07 ENCOUNTER — Other Ambulatory Visit (HOSPITAL_COMMUNITY): Payer: Self-pay

## 2022-06-07 DIAGNOSIS — I10 Essential (primary) hypertension: Secondary | ICD-10-CM | POA: Diagnosis not present

## 2022-06-07 DIAGNOSIS — R42 Dizziness and giddiness: Secondary | ICD-10-CM | POA: Insufficient documentation

## 2022-06-07 DIAGNOSIS — H9313 Tinnitus, bilateral: Secondary | ICD-10-CM | POA: Diagnosis not present

## 2022-06-07 DIAGNOSIS — E119 Type 2 diabetes mellitus without complications: Secondary | ICD-10-CM | POA: Insufficient documentation

## 2022-06-07 DIAGNOSIS — R9431 Abnormal electrocardiogram [ECG] [EKG]: Secondary | ICD-10-CM | POA: Diagnosis not present

## 2022-06-07 DIAGNOSIS — R0602 Shortness of breath: Secondary | ICD-10-CM | POA: Diagnosis not present

## 2022-06-07 DIAGNOSIS — Z743 Need for continuous supervision: Secondary | ICD-10-CM | POA: Diagnosis not present

## 2022-06-07 DIAGNOSIS — Z0389 Encounter for observation for other suspected diseases and conditions ruled out: Secondary | ICD-10-CM | POA: Diagnosis not present

## 2022-06-07 DIAGNOSIS — Z7984 Long term (current) use of oral hypoglycemic drugs: Secondary | ICD-10-CM | POA: Insufficient documentation

## 2022-06-07 DIAGNOSIS — R6889 Other general symptoms and signs: Secondary | ICD-10-CM | POA: Diagnosis not present

## 2022-06-07 DIAGNOSIS — Z79899 Other long term (current) drug therapy: Secondary | ICD-10-CM | POA: Diagnosis not present

## 2022-06-07 DIAGNOSIS — K029 Dental caries, unspecified: Secondary | ICD-10-CM | POA: Diagnosis not present

## 2022-06-07 DIAGNOSIS — I771 Stricture of artery: Secondary | ICD-10-CM | POA: Diagnosis not present

## 2022-06-07 LAB — CBC WITH DIFFERENTIAL/PLATELET
Abs Immature Granulocytes: 0.02 10*3/uL (ref 0.00–0.07)
Basophils Absolute: 0 10*3/uL (ref 0.0–0.1)
Basophils Relative: 1 %
Eosinophils Absolute: 0.2 10*3/uL (ref 0.0–0.5)
Eosinophils Relative: 2 %
HCT: 40.3 % (ref 36.0–46.0)
Hemoglobin: 12.6 g/dL (ref 12.0–15.0)
Immature Granulocytes: 0 %
Lymphocytes Relative: 27 %
Lymphs Abs: 1.9 10*3/uL (ref 0.7–4.0)
MCH: 29.2 pg (ref 26.0–34.0)
MCHC: 31.3 g/dL (ref 30.0–36.0)
MCV: 93.3 fL (ref 80.0–100.0)
Monocytes Absolute: 0.6 10*3/uL (ref 0.1–1.0)
Monocytes Relative: 8 %
Neutro Abs: 4.5 10*3/uL (ref 1.7–7.7)
Neutrophils Relative %: 62 %
Platelets: 241 10*3/uL (ref 150–400)
RBC: 4.32 MIL/uL (ref 3.87–5.11)
RDW: 11.8 % (ref 11.5–15.5)
WBC: 7.2 10*3/uL (ref 4.0–10.5)
nRBC: 0 % (ref 0.0–0.2)

## 2022-06-07 LAB — TROPONIN I (HIGH SENSITIVITY)
Troponin I (High Sensitivity): 10 ng/L (ref <18)
Troponin I (High Sensitivity): 21 ng/L — ABNORMAL HIGH (ref <18)
Troponin I (High Sensitivity): 20 ng/L — ABNORMAL HIGH (ref <18)

## 2022-06-07 LAB — URINALYSIS, ROUTINE W REFLEX MICROSCOPIC
Bacteria, UA: NONE SEEN
Bilirubin Urine: NEGATIVE
Glucose, UA: NEGATIVE mg/dL
Hgb urine dipstick: NEGATIVE
Ketones, ur: NEGATIVE mg/dL
Nitrite: NEGATIVE
Protein, ur: NEGATIVE mg/dL
Specific Gravity, Urine: 1.014 (ref 1.005–1.030)
pH: 6 (ref 5.0–8.0)

## 2022-06-07 LAB — BASIC METABOLIC PANEL
Anion gap: 10 (ref 5–15)
BUN: 22 mg/dL (ref 8–23)
CO2: 24 mmol/L (ref 22–32)
Calcium: 8.8 mg/dL — ABNORMAL LOW (ref 8.9–10.3)
Chloride: 102 mmol/L (ref 98–111)
Creatinine, Ser: 0.98 mg/dL (ref 0.44–1.00)
GFR, Estimated: 58 mL/min — ABNORMAL LOW (ref >60)
Glucose, Bld: 103 mg/dL — ABNORMAL HIGH (ref 70–99)
Potassium: 4.4 mmol/L (ref 3.5–5.1)
Sodium: 136 mmol/L (ref 135–145)

## 2022-06-07 MED ORDER — MECLIZINE HCL 12.5 MG PO TABS
12.5000 mg | ORAL_TABLET | Freq: Two times a day (BID) | ORAL | 0 refills | Status: DC | PRN
  Filled 2022-06-07 (×2): qty 10, 5d supply, fill #0

## 2022-06-07 MED ORDER — MECLIZINE HCL 12.5 MG PO TABS
12.5000 mg | ORAL_TABLET | Freq: Two times a day (BID) | ORAL | 0 refills | Status: DC | PRN
  Filled 2022-06-07: qty 10, 5d supply, fill #0

## 2022-06-07 MED ORDER — IRBESARTAN 150 MG PO TABS
150.0000 mg | ORAL_TABLET | Freq: Every day | ORAL | Status: DC
  Administered 2022-06-07: 150 mg via ORAL
  Filled 2022-06-07: qty 1

## 2022-06-07 MED ORDER — MECLIZINE HCL 25 MG PO TABS
25.0000 mg | ORAL_TABLET | Freq: Once | ORAL | Status: AC
  Administered 2022-06-07: 25 mg via ORAL
  Filled 2022-06-07: qty 1

## 2022-06-07 MED ORDER — IOHEXOL 350 MG/ML SOLN
100.0000 mL | Freq: Once | INTRAVENOUS | Status: AC | PRN
  Administered 2022-06-07: 100 mL via INTRAVENOUS

## 2022-06-07 NOTE — ED Provider Triage Note (Signed)
Emergency Medicine Provider Triage Evaluation Note  Allison Franco , a 81 y.o. female  was evaluated in triage.  Pt complains of shortness of breath.  Patient states that today she was at home when she began having ringing in her right ear.  She states that this is a chronic issue but worsened today.  She states that she then later became restless and felt short of breath.  She also notes she had dizziness this morning.  She called EMS because she was alone and when they took her blood pressure it was elevated from baseline..  Denies chest pain, palpitations.  Review of Systems  Positive:  Negative:   Physical Exam  BP (!) 172/84 (BP Location: Right Arm)   Pulse 80   Temp 97.9 F (36.6 C) (Oral)   Resp 16   SpO2 97%  Gen:   Awake, no distress   Resp:  Normal effort, lungs clear MSK:   Moves extremities without difficulty  Other:  S1-S2 without murmur  Medical Decision Making  Medically screening exam initiated at 1:30 AM.  Appropriate orders placed.  Allison Franco was informed that the remainder of the evaluation will be completed by another provider, this initial triage assessment does not replace that evaluation, and the importance of remaining in the ED until their evaluation is complete.     Allison Hillier, PA-C 06/07/22 0131

## 2022-06-07 NOTE — ED Provider Notes (Addendum)
[facility] EMERGENCY DEPARTMENT Provider Note   CSN: [id number] Arrival date & time: 06/07/22  0113     History  Chief Complaint  Patient presents with   Hypertension    Allison Franco is a 81 y.o. female.   Hypertension  Patient presents for tinnitus.  Medical history includes anxiety, depression, DM, HTN.  She has had chronic tinnitus for the past 9 years.  This followed the death of her husband.  She was previously on a medication which she states was addictive (likely benzos) which she stopped taking 2 years ago.  Chronic tinnitus worsened at that time.  Yesterday, she had an acute worsening.  She states that this happens from time to time.  When it does, she becomes restless.  Because of her restlessness she called EMS.  EMS noted hypertension on scene.  Patient takes daily Benicar, in the mornings.  She is prescribed clonidine as needed for systolic greater than 353.  She states that she has not been taking this.  She currently denies symptoms other than the ongoing tinnitus.  She denies any recent trauma.  She describes her tinnitus as constant and nonpulsatile.     Home Medications Prior to Admission medications   Medication Sig Start Date End Date Taking? Authorizing Provider  acetaminophen (TYLENOL) 500 MG tablet Take 1 tablet (500 mg total) by mouth every 6 (six) hours as needed for mild pain, moderate pain or headache. Reported on 05/10/2016 08/09/19   Kalman Drape, PA  Alcohol Swabs (ALCOHOL WIPES) 70 % PADS by Does not apply route. Use with blood sugar check and injection of insulin    [provider]  benzonatate (TESSALON) 100 MG capsule Take 1 capsule (100 mg total) by mouth 3 (three) times daily as needed for cough. 02/06/22   Barrett Henle, MD  blood glucose meter kit and supplies KIT Dispense based on patient and insurance preference. Use up to four times daily as directed. (FOR ICD-9 250.00, 250.01). 07/05/18   Florencia Reasons, MD   Cholecalciferol (VITAMIN D3) 25 MCG (1000 UT) CAPS Take 1 capsule (1,000 Units total) by mouth daily. 06/02/21   [name], FNP  cloNIDine (CATAPRES) 0.1 MG tablet Take 1 tablet by mouth daily if systolic blood pressure over 160. If remains elevated call to office 06/02/21   [name], FNP  diclofenac Sodium (VOLTAREN) 1 % GEL Apply 2 g topically 4 (four) times daily. 04/29/22 04/29/23  [name], FNP  dorzolamide-timolol (COSOPT) 22.3-6.8 MG/ML ophthalmic solution Instill 1 drop into both eyes twice a day 10/28/21     glucose blood (ACCU-CHEK GUIDE) test strip TEST 3 TIMES DAILY 04/29/22   [name], FNP  ibuprofen (ADVIL) 400 MG tablet Take 1 tablet (400 mg total) by mouth every 8 (eight) hours as needed (pain). 02/06/22   Barrett Henle, MD  meclizine (ANTIVERT) 12.5 MG tablet Take 1 tablet (12.5 mg total) by mouth 2 (two) times daily as needed for dizziness. 06/07/22  Yes Godfrey Pick, MD  MELATONIN PO Take by mouth.    [provider]  metFORMIN (GLUCOPHAGE) 500 MG tablet Take 1 tablet (500 mg total) by mouth daily with breakfast. 04/19/22   [name], FNP  Multiple Vitamins-Minerals (CENTRUM SILVER 50+WOMEN) TABS Take 1 tablet by mouth daily.    [provider]  olmesartan (BENICAR) 20 MG tablet Take 1 tablet (20 mg total) by mouth daily. 05/03/22   [name], FNP  traZODone (DESYREL) 50 MG tablet Take 1  tablet (50 mg total) by mouth at bedtime. 12/08/21   Bary Castilla, NP  Zoster Vaccine Adjuvanted [facility]) injection Inject 0.5 mLs into the muscle once for 1 dose. Administer 2nd dose in 2-6 months  Please fax when each dose administered [phone] 08/23/21   [name], FNP      Allergies    Aspirin, Hydroxyzine, and Valium [diazepam]    Review of Systems   Review of Systems  HENT:  Positive for tinnitus.   All other systems reviewed and are negative.   Physical Exam Updated Vital Signs BP (!) 143/69   Pulse 81   Temp 97.9 F (36.6 C)  (Oral)   Resp (!) 36   SpO2 97%  Physical Exam Vitals and nursing note reviewed.  Constitutional:      General: She is not in acute distress.    Appearance: Normal appearance. She is well-developed and normal weight. She is not ill-appearing, toxic-appearing or diaphoretic.  HENT:     Head: Normocephalic and atraumatic.     Right Ear: Tympanic membrane, ear canal and external ear normal.     Left Ear: Tympanic membrane, ear canal and external ear normal.     Nose: Nose normal.     Mouth/Throat:     Mouth: Mucous membranes are moist.     Pharynx: Oropharynx is clear.  Eyes:     Extraocular Movements: Extraocular movements intact.     Conjunctiva/sclera: Conjunctivae normal.  Cardiovascular:     Rate and Rhythm: Normal rate and regular rhythm.     Heart sounds: No murmur heard. Pulmonary:     Effort: Pulmonary effort is normal. No respiratory distress.     Breath sounds: Normal breath sounds. No wheezing or rales.  Chest:     Chest wall: No tenderness.  Abdominal:     Palpations: Abdomen is soft.     Tenderness: There is no abdominal tenderness.  Musculoskeletal:        General: No swelling. Normal range of motion.     Cervical back: Normal range of motion and neck supple. No rigidity or tenderness.     Right lower leg: No edema.     Left lower leg: No edema.  Skin:    General: Skin is warm and dry.     Capillary Refill: Capillary refill takes less than 2 seconds.     Coloration: Skin is not jaundiced or pale.  Neurological:     General: No focal deficit present.     Mental Status: She is alert and oriented to person, place, and time.     Cranial Nerves: No cranial nerve deficit.     Sensory: No sensory deficit.     Motor: No weakness.     Coordination: Coordination normal.  Psychiatric:        Mood and Affect: Mood is anxious.        Speech: Speech normal.        Behavior: Behavior normal. Behavior is cooperative.        Thought Content: Thought content normal.         Judgment: Judgment normal.     ED Results / Procedures / Treatments   Labs (all labs ordered are listed, but only abnormal results are displayed) Labs Reviewed  BASIC METABOLIC PANEL - Abnormal; Notable for the following components:      Result Value   Glucose, Bld 103 (*)    Calcium 8.8 (*)    GFR, Estimated 58 (*)    All  other components within normal limits  URINALYSIS, ROUTINE W REFLEX MICROSCOPIC - Abnormal; Notable for the following components:   Leukocytes,Ua SMALL (*)    All other components within normal limits  TROPONIN I (HIGH SENSITIVITY) - Abnormal; Notable for the following components:   Troponin I (High Sensitivity) 21 (*)    All other components within normal limits  TROPONIN I (HIGH SENSITIVITY) - Abnormal; Notable for the following components:   Troponin I (High Sensitivity) 20 (*)    All other components within normal limits  CBC WITH DIFFERENTIAL/PLATELET  TROPONIN I (HIGH SENSITIVITY)    EKG EKG Interpretation  Date/Time:  Tuesday June 07 2022 07:48:07 EDT Ventricular Rate:  75 PR Interval:  357 QRS Duration: 85 QT Interval:  364 QTC Calculation: 407 R Axis:   -3 Text Interpretation: Sinus rhythm Prolonged PR interval Left atrial enlargement Anterior infarct, old Confirmed by Godfrey Pick [phone]) on 06/07/2022 9:07:05 AM  Radiology CT Angio Chest PE W and/or Wo Contrast  Result Date: 06/07/2022 CLINICAL DATA:  Pulmonary embolism suspected.  High probability EXAM: CT ANGIOGRAPHY CHEST WITH CONTRAST TECHNIQUE: Multidetector CT imaging of the chest was performed using the standard protocol during bolus administration of intravenous contrast. Multiplanar CT image reconstructions and MIPs were obtained to evaluate the vascular anatomy. RADIATION DOSE REDUCTION: This exam was performed according to the departmental dose-optimization program which includes automated exposure control, adjustment of the mA and/or kV according to patient size and/or use of iterative  reconstruction technique. CONTRAST:  1110m OMNIPAQUE IOHEXOL 350 MG/ML SOLN COMPARISON:  None Available. FINDINGS: Cardiovascular: No filling defects within the pulmonary arteries to suggest acute pulmonary embolism. Mediastinum/Nodes: No axillary or supraclavicular adenopathy. No mediastinal or hilar adenopathy. No pericardial fluid. Esophagus normal. Lungs/Pleura: No suspicious pulmonary nodules. Normal pleural. Airways normal. Upper Abdomen: Limited view of the liver, kidneys, pancreas are unremarkable. Normal adrenal glands. Musculoskeletal: No aggressive osseous lesion. Review of the MIP images confirms the above findings. IMPRESSION: 1. No evidence acute pulmonary embolism. 2. No acute pulmonary parenchymal findings. Electronically Signed   By: SSuzy BouchardM.D.   On: 06/07/2022 08:58   CT ANGIO HEAD NECK W WO CM  Result Date: 06/07/2022 CLINICAL DATA:  81year old female with dizziness, shortness of breath. EXAM: CT ANGIOGRAPHY HEAD AND NECK TECHNIQUE: Multidetector CT imaging of the head and neck was performed using the standard protocol during bolus administration of intravenous contrast. Multiplanar CT image reconstructions and MIPs were obtained to evaluate the vascular anatomy. Carotid stenosis measurements (when applicable) are obtained utilizing NASCET criteria, using the distal internal carotid diameter as the denominator. RADIATION DOSE REDUCTION: This exam was performed according to the departmental dose-optimization program which includes automated exposure control, adjustment of the mA and/or kV according to patient size and/or use of iterative reconstruction technique. CONTRAST:  1080mOMNIPAQUE IOHEXOL 350 MG/ML SOLN COMPARISON:  CTA chest today reported separately. Brain MRI 11/03/2018. FINDINGS: CT HEAD Brain: Stable cerebral volume since 2019, normal for age. No midline shift, ventriculomegaly, mass effect, evidence of mass lesion, intracranial hemorrhage or evidence of cortically  based acute infarction. Mild for age patchy bilateral cerebral white matter hypodensity does not appear significantly changed from the 2019 MRI. Calvarium and skull base: Negative. Paranasal sinuses: Visualized paranasal sinuses and mastoids are clear. Orbits: Visualized orbits and scalp soft tissues are within normal limits. CTA NECK Skeleton: Carious dentition. Cervical spine degeneration. No acute osseous abnormality identified. Upper chest: CTA chest is reported separately. Other neck: Negative. Aortic arch: 3 vessel arch configuration. No  significant arch atherosclerosis. Right carotid system: Tortuous brachiocephalic artery with no significant plaque or stenosis. Similar tortuosity of the proximal right CCA without stenosis. Negative right carotid bifurcation. Tortuous right ICA without plaque or stenosis. Left carotid system: Similar tortuosity with no significant plaque or stenosis. Vertebral arteries: Mildly tortuous proximal right subclavian artery without plaque or stenosis. Normal right vertebral artery origin. Right vertebral artery is patent and mildly tortuous to the skull base with no plaque or stenosis. No significant proximal left subclavian artery plaque. Normal left vertebral artery origin. Codominant left vertebral artery is mildly tortuous to the skull base, patent with no significant plaque or stenosis. CTA HEAD Posterior circulation: Codominant distal vertebral arteries are patent to the vertebrobasilar junction without stenosis. Patent PICA origins. Patent basilar artery without stenosis. Normal SCA and PCA origins. Posterior communicating arteries are diminutive or absent. Bilateral PCA branches are tortuous with no stenosis. Anterior circulation: Both ICA siphons are patent. Mild to moderate left siphon calcified plaque with only mild stenosis. Mild right siphon calcified plaque and no stenosis. Patent carotid termini, MCA and ACA origins. Dominant right and diminutive left ACA A1 segments  with tortuosity. Normal anterior communicating artery. Bilateral ACA branches are within normal limits. Left MCA M1 segment and bifurcation are mildly tortuous. No left MCA branch occlusion or stenosis identified. Right MCA M1 segment and bifurcation are tortuous without stenosis. No right MCA branch occlusion or stenosis identified. Venous sinuses: Early contrast timing, grossly patent. Anatomic variants: Dominant right ACA A1 segment. Review of the MIP images confirms the above findings IMPRESSION: 1. Negative for large vessel occlusion. Generalized arterial tortuosity in the head and neck but mild for age atherosclerosis. No hemodynamically significant stenosis. 2. No acute intracranial abnormality by CT. Mild for age cerebral white matter changes appears stable since a 2019 MRI. 3. CTA chest reported separately. Electronically Signed   By: Genevie Ann M.D.   On: 06/07/2022 08:49   DG Chest 2 View  Result Date: 06/07/2022 CLINICAL DATA:  Shortness of breath, hypertension EXAM: CHEST - 2 VIEW COMPARISON:  05/23/2021 FINDINGS: Mild patchy right lower lobe opacity, new from the prior. No pleural effusion or pneumothorax. Mild cardiomegaly. IMPRESSION: Mild patchy right lower lobe opacity, suspicious for pneumonia. Electronically Signed   By: Julian Hy M.D.   On: 06/07/2022 02:01    Procedures Procedures    Medications Ordered in ED Medications  irbesartan (AVAPRO) tablet 150 mg (150 mg Oral Given 06/07/22 0946)  meclizine (ANTIVERT) tablet 25 mg (25 mg Oral Given 06/07/22 0841)  iohexol (OMNIPAQUE) 350 MG/ML injection 100 mL (100 mLs Intravenous Contrast Given 06/07/22 2426)    ED Course/ Medical Decision Making/ A&P                           Medical Decision Making Amount and/or Complexity of Data Reviewed Radiology: ordered.  Risk Prescription drug management.   This patient presents to the ED for concern of tinnitus, this involves an extensive number of treatment options, and is a  complaint that carries with it a high risk of complications and morbidity.  The differential diagnosis includes otitis, lab otitis, acoustic neuroma, IIH, medication effect, Mnire's disease, head trauma, AVM, carotid stenosis, carotid dissection   Co morbidities that complicate the patient evaluation  anxiety, depression, DM, HTN   Additional history obtained:  Additional history obtained from N/A External records from outside source obtained and reviewed including EMR   Lab Tests:  I Ordered,  and personally interpreted labs.  The pertinent results include: CBC shows normal hemoglobin with no leukocytosis.  Her electrolytes are normal.  Troponins slightly increased from 10 --> 21.  Third troponin was stable at 20.   Imaging Studies ordered:  I ordered imaging studies including chest x-ray, CTA head and neck, CTA chest I independently visualized and interpreted imaging which showed chest x-ray showed a patchy right lower lobe opacity.  CTA chest showed no evidence of pneumonia and no other acute findings.  On CTA of head and neck, patient does have generalized arterial tortuosity and this may be contributing to her tinnitus.  There are no acute findings on these images. I agree with the radiologist interpretation   Cardiac Monitoring: / EKG:  The patient was maintained on a cardiac monitor.  I personally viewed and interpreted the cardiac monitored which showed an underlying rhythm of: Sinus rhythm   Problem List / ED Course / Critical interventions / Medication management  Patient is a 81 year old female presenting from home by EMS due to acute on chronic tinnitus.  She states that she will have recurrent episodes of exacerbations of her chronic tinnitus, which has been present for the past 9 years.  Although she reportedly endorsed shortness of breath with EMS, she currently denies this.  She currently endorses tinnitus only.  On physical exam, bilateral auditory canals and TMs  are normal in appearance.  She has no focal neurologic deficits.  I am not able to appreciate any cardiac or vascular murmurs.  Prior to being bedded in the ED, she did undergo initial diagnostic work-up.  On chest x-ray, there is a concern of a right lower lobe patchy airspace opacity.  CT of chest was ordered to further evaluate.  CTA of head and neck was ordered to rule out vascular cause of her worsened tinnitus.  On lab work, she did have a slight increase in her troponin from 10-21.  Third troponin was ordered.  Currently, blood pressure is moderately elevated.  Meclizine was ordered for symptomatic relief of possible inner ear etiology of her tinnitus.  Results of CT imaging showed no acute findings.  Patient was given her home dose of blood pressure medicine.  On the third troponin, results show no further uptrend.  Given no further uptrend and absence of any chest discomfort, no further work-up or treatment is indicated.  Following the meclizine, she does feel that she has had some improvement in her symptoms.  She was prescribed short course of this to be taken at home as needed.  Per chart review, she has seen ENT 3 years ago for her tinnitus.  She was advised to follow-up with an audiologist for hearing aid placement which can help drown out the tinnitus.  She states that she did follow-up and has hearing aids at home.  Given no acute findings today, patient is stable for discharge home.  She was advised to follow-up with her primary care doctor for ongoing management of her chronic conditions.  She was discharged in good condition. I ordered medication including meclizine for tinnitus Reevaluation of the patient after these medicines showed that the patient improved I have reviewed the patients home medicines and have made adjustments as needed   Social Determinants of Health:  Has access to outpatient care         Final Clinical Impression(s) / ED Diagnoses Final diagnoses:  Tinnitus  of both ears    Rx / DC Orders ED Discharge Orders  Ordered    meclizine (ANTIVERT) 12.5 MG tablet  2 times daily PRN        06/07/22 1028              Godfrey Pick, MD 06/07/22 1030    Godfrey Pick, MD 06/07/22 1035

## 2022-06-07 NOTE — Discharge Instructions (Addendum)
You received the medication while in the emergency department to help with the ringing in your ears.  If you feel that this helped, it is a medication that you can take at home.  Take this only if it helps and only as needed.  A prescription was sent to your pharmacy.  You also received your morning blood pressure medication here as well.  Follow-up with your primary care doctor for ongoing management of your chronic conditions.

## 2022-06-07 NOTE — ED Triage Notes (Signed)
Pt brought to ED by [facility] with c/o hypertension and "ringing in the ears" that has been ongoing for years but has recently worsened within the past two hours. Ringing is localized to right ear.    EMS Vitals BP 188/98 HR 76 RR 18 SPO2 98% RA

## 2022-06-08 ENCOUNTER — Telehealth: Payer: Self-pay

## 2022-06-08 NOTE — Telephone Encounter (Signed)
Error

## 2022-06-08 NOTE — Telephone Encounter (Signed)
Transition Care Management Unsuccessful Follow-up Telephone Call  Date of discharge and from where:  06/07/2022  Attempts:  1st Attempt  Reason for unsuccessful TCM follow-up call:  Left voice message

## 2022-06-08 NOTE — Telephone Encounter (Signed)
Transition Care Management Follow-up Telephone Call Date of discharge and from where: 06/07/2022 [facility]  How have you been since you were released from the hospital? Pt states she is okay, feels fine. Any questions or concerns? No  Items Reviewed: Did the pt receive and understand the discharge instructions provided? Yes  Medications obtained and verified? Yes  Other? Yes  Any new allergies since your discharge? No  Dietary orders reviewed? Yes Do you have support at home? Yes   Home Care and Equipment/Supplies: Were home health services ordered? no If so, what is the name of the agency? N/a  Has the agency set up a time to come to the patient's home? no Were any new equipment or medical supplies ordered?  No What is the name of the medical supply agency? N/a Were you able to get the supplies/equipment? no Do you have any questions related to the use of the equipment or supplies? No  Functional Questionnaire: (I = Independent and D = Dependent) ADLs: i  Bathing/Dressing- i  Meal Prep- i  Eating- i  Maintaining continence- i  Transferring/Ambulation- i  Managing Meds- i  Follow up appointments reviewed:  PCP Hospital f/u appt confirmed? Yes  Scheduled to see [name] on 06/20/2022 @ [facility]. [facility] f/u appt confirmed? No  Scheduled to see n/a on n/a @ n/a. Are transportation arrangements needed? No  If their condition worsens, is the pt aware to call PCP or go to the Emergency Dept.? Yes Was the patient provided with contact information for the PCP's office or ED? Yes Was to pt encouraged to call back with questions or concerns? Yes

## 2022-06-15 ENCOUNTER — Telehealth: Payer: Self-pay

## 2022-06-15 ENCOUNTER — Other Ambulatory Visit (HOSPITAL_COMMUNITY): Payer: Self-pay

## 2022-06-15 NOTE — Chronic Care Management (AMB) (Cosign Needed Addendum)
Care Gap(s) Not Met that Need to be Addressed:  Controlling High Blood Pressure   Action Taken: Last BP 06-07-2022 130/72   Follow Up: None needed  [place] Pharmacist Assistant [phone]

## 2022-06-17 ENCOUNTER — Other Ambulatory Visit (HOSPITAL_COMMUNITY): Payer: Self-pay

## 2022-06-20 ENCOUNTER — Ambulatory Visit: Payer: Medicare Other | Admitting: Nurse Practitioner

## 2022-06-27 ENCOUNTER — Telehealth: Payer: Self-pay

## 2022-06-27 NOTE — Chronic Care Management (AMB) (Signed)
   Allison Franco was reminded to have all medications, supplements and any blood glucose and blood pressure readings available for review with [name], Pharm. D, at her telephone visit on 06-29-2022 at 2:00.   [place] Pharmacist Assistant [phone]

## 2022-06-29 ENCOUNTER — Encounter: Payer: Self-pay | Admitting: Nurse Practitioner

## 2022-06-29 ENCOUNTER — Ambulatory Visit (INDEPENDENT_AMBULATORY_CARE_PROVIDER_SITE_OTHER): Payer: Medicare Other

## 2022-06-29 ENCOUNTER — Ambulatory Visit (INDEPENDENT_AMBULATORY_CARE_PROVIDER_SITE_OTHER): Payer: Medicare Other | Admitting: Nurse Practitioner

## 2022-06-29 ENCOUNTER — Other Ambulatory Visit (HOSPITAL_COMMUNITY): Payer: Self-pay

## 2022-06-29 DIAGNOSIS — I119 Hypertensive heart disease without heart failure: Secondary | ICD-10-CM

## 2022-06-29 MED ORDER — OLMESARTAN MEDOXOMIL 40 MG PO TABS
40.0000 mg | ORAL_TABLET | Freq: Every day | ORAL | 1 refills | Status: DC
  Filled 2022-06-29 – 2022-07-04 (×2): qty 90, 90d supply, fill #0

## 2022-06-29 MED ORDER — OLMESARTAN MEDOXOMIL 40 MG PO TABS
20.0000 mg | ORAL_TABLET | Freq: Every day | ORAL | 1 refills | Status: DC
  Filled 2022-06-29: qty 30, 60d supply, fill #0

## 2022-06-29 NOTE — Progress Notes (Signed)
Allison Franco,acting as a Education administrator for Allison Brine, FNP.,have documented all relevant documentation on the behalf of Allison Brine, FNP,as directed by  Allison Brine, FNP while in the presence of Allison Franco, Allison Franco.    Subjective:     Patient ID: Allison Franco , female    DOB: 04/29/1941 , 81 y.o.   MRN: [id number]   Chief Complaint  Patient presents with   Follow-up    ED    HPI  Patient presents today for an ED follow up from her visit on 06/07/22. Patient is complaint with her medications. Patient was admitted into hospital on 06/07/22 and was discharged the same day. Patient states she has problem with her ears, patients ears were ringing and she became restless. Patient states her ears aren't as bad. Reports she is taking her olmesartan 20 mg daily, no significant changes in her diet. She states "we went to convention this past weekend and ate fast food". She is not checking her blood sugar. She reports her pain continues to her left knee. Her orthopedic gave her an injection to both knees.    Past Medical History:  Diagnosis Date   Anxiety    Constipation    Depression    "following husband's death"   Diabetes mellitus without complication ([place])    Hypertension    KNEE PAIN, BILATERAL 02/13/2009   Qualifier: Diagnosis of  By: Hassell Done FNP, Tori Milks     Tinnitus      Family History  Problem Relation Age of Onset   Diabetes Mother    Hypertension Father    Breast cancer Neg Hx      Current Outpatient Medications:    acetaminophen (TYLENOL) 500 MG tablet, Take 1 tablet (500 mg total) by mouth every 6 (six) hours as needed for mild pain, moderate pain or headache. Reported on 05/10/2016, Disp: 30 tablet, Rfl: 0   Alcohol Swabs (ALCOHOL WIPES) 70 % PADS, by Does not apply route. Use with blood sugar check and injection of insulin, Disp: , Rfl:    blood glucose meter kit and supplies KIT, Dispense based on patient and insurance preference. Use up to four times daily as directed.  (FOR ICD-9 250.00, 250.01)., Disp: 1 each, Rfl: 0   Cholecalciferol (VITAMIN D3) 25 MCG (1000 UT) CAPS, Take 1 capsule (1,000 Units total) by mouth daily., Disp: 60 capsule, Rfl: 1   cloNIDine (CATAPRES) 0.1 MG tablet, Take 1 tablet by mouth daily if systolic blood pressure over 160. If remains elevated call to office, Disp: 90 tablet, Rfl: 1   diclofenac Sodium (VOLTAREN) 1 % GEL, Apply 2 g topically 4 (four) times daily., Disp: 100 g, Rfl: 2   dorzolamide-timolol (COSOPT) 22.3-6.8 MG/ML ophthalmic solution, Instill 1 drop into both eyes twice a day (Patient not taking: Reported on 06/29/2022), Disp: 10 mL, Rfl: 11   glucose blood (ACCU-CHEK GUIDE) test strip, TEST 3 TIMES DAILY, Disp: 100 strip, Rfl: 3   meclizine (ANTIVERT) 12.5 MG tablet, Take 1 tablet (12.5 mg total) by mouth 2 (two) times daily as needed for dizziness. (Patient not taking: Reported on 06/29/2022), Disp: 10 tablet, Rfl: 0   MELATONIN PO, Take by mouth., Disp: , Rfl:    metFORMIN (GLUCOPHAGE) 500 MG tablet, Take 1 tablet (500 mg total) by mouth daily with breakfast., Disp: 90 tablet, Rfl: 1   Multiple Vitamins-Minerals (CENTRUM SILVER 50+WOMEN) TABS, Take 1 tablet by mouth daily., Disp: , Rfl:    traZODone (DESYREL) 50 MG tablet, Take 1 tablet (50  mg total) by mouth at bedtime., Disp: 30 tablet, Rfl: 2   olmesartan (BENICAR) 40 MG tablet, Take 1 tablet (40 mg total) by mouth daily. (Patient taking differently: Take 20 mg by mouth in the morning and at bedtime.), Disp: 90 tablet, Rfl: 1   sodium chloride 1 g tablet, Take 1 tablet (1 g total) by mouth once for 1 dose., Disp: 1 tablet, Rfl: 0   Zoster Vaccine Adjuvanted (SHINGRIX) injection, Inject 0.5 mLs into the muscle once for 1 dose. Administer 2nd dose in 2-6 months  Please fax when each dose administered [phone] (Patient not taking: Reported on 06/29/2022), Disp: 0.5 mL, Rfl: 1   Allergies  Allergen Reactions   Aspirin Other (See Comments)    insomnia   Hydroxyzine Other  (See Comments)    insomnia   Valium [Diazepam] Other (See Comments)    Insomnia, 05/10/16 pt states she took as young person- made her depressed     Review of Systems  Constitutional: Negative.   Respiratory: Negative.  Negative for shortness of breath.   Cardiovascular: Negative.  Negative for chest pain and palpitations.  Gastrointestinal: Negative.   Endocrine: Negative for polydipsia, polyphagia and polyuria.  Musculoskeletal:  Negative for arthralgias.  Neurological: Negative.  Negative for dizziness and headaches.  Psychiatric/Behavioral: Negative.       Today's Vitals   06/29/22 0927 06/29/22 1009  BP: (!) 160/90 128/72  Pulse: 78   Temp: 97.7 F (36.5 C)   TempSrc: Oral   Weight: 174 lb 6.4 oz (79.1 kg)   Height: _0  (1.6 m)   PainSc: 3    PainLoc: Knee    Body mass index is 30.89 kg/m.   Objective:  Physical Exam Vitals reviewed.  Constitutional:      General: She is not in acute distress.    Appearance: Normal appearance. She is obese.  Cardiovascular:     Rate and Rhythm: Normal rate and regular rhythm.     Pulses: Normal pulses.     Heart sounds: Normal heart sounds. No murmur heard. Pulmonary:     Effort: Pulmonary effort is normal. No respiratory distress.     Breath sounds: Normal breath sounds. No wheezing.  Skin:    General: Skin is warm and dry.     Capillary Refill: Capillary refill takes less than 2 seconds.  Neurological:     General: No focal deficit present.     Mental Status: She is alert and oriented to person, place, and time.     Cranial Nerves: No cranial nerve deficit.     Motor: No weakness.  Psychiatric:        Mood and Affect: Mood normal. Mood is not anxious.        Behavior: Behavior normal.        Thought Content: Thought content normal.        Judgment: Judgment normal.         Assessment And Plan:     1. Hypertensive heart disease without heart failure Comments: Blood pressure is not well controlled had ER visit,  repeat done is better. Will increase olmesartan to 40 mg daily. Return in 2 weeks for [place] BP check. - olmesartan (BENICAR) 40 MG tablet; Take 1 tablet (40 mg total) by mouth daily. (Patient taking differently: Take 20 mg by mouth in the morning and at bedtime.)  Dispense: 90 tablet; Refill: 1     Patient was given opportunity to ask questions. Patient verbalized understanding of the plan and  was able to repeat key elements of the plan. All questions were answered to their satisfaction.  Allison Brine, FNP   I, Allison Brine, FNP, have reviewed all documentation for this visit. The documentation on 06/29/22 for the exam, diagnosis, procedures, and orders are all accurate and complete.   IF YOU HAVE BEEN REFERRED TO A SPECIALIST, IT MAY TAKE 1-2 WEEKS TO SCHEDULE/PROCESS THE REFERRAL. IF YOU HAVE NOT HEARD FROM US/SPECIALIST IN TWO WEEKS, PLEASE GIVE [place] A CALL AT [phone] X [phone].   THE PATIENT IS ENCOURAGED TO PRACTICE SOCIAL DISTANCING DUE TO THE COVID-19 PANDEMIC.

## 2022-06-29 NOTE — Patient Instructions (Addendum)
Tinnitus ?Tinnitus refers to hearing a sound when there is no actual source for that sound. This is often described as ringing in the ears. However, people with this condition may hear a variety of noises, in one ear or in both ears. ?The sounds of tinnitus can be soft, loud, or somewhere in between. Tinnitus can last for a few seconds or can be constant for days. It may go away without treatment and come back at various times. When tinnitus is constant or happens often, it can lead to other problems, such as trouble sleeping and trouble concentrating. ?Almost everyone experiences tinnitus at some point. Tinnitus is not the same as hearing loss. Tinnitus that is long-lasting (chronic) or comes back often (recurs) may require medical attention. ?What are the causes? ?The cause of tinnitus is often not known. In some cases, it can result from: ?Exposure to loud noises from machinery, music, or other sources. ?An object (foreign body) stuck in the ear. ?Earwax buildup. ?Drinking alcohol or caffeine. ?Taking certain medicines. ?Age-related hearing loss. ?It may also be caused by medical conditions such as: ?Ear or sinus infections. ?Heart diseases or high blood pressure. ?Allergies. ?M?ni?re's disease. ?Thyroid problems. ?Tumors. ?A weak, bulging blood vessel (aneurysm) near the ear. ?What increases the risk? ?The following factors may make you more likely to develop this condition: ?Exposure to loud noises. ?Age. Tinnitus is more likely in older individuals. ?Using alcohol or tobacco. ?What are the signs or symptoms? ?The main symptom of tinnitus is hearing a sound when there is no source for that sound. It may sound like: ?Buzzing. ?Sizzling. ?Ringing. ?Blowing air. ?Hissing. ?Whistling. ?Other sounds may include: ?Roaring. ?Running water. ?A musical note. ?Tapping. ?Humming. ?Symptoms may affect only one ear (unilateral) or both ears (bilateral). ?How is this diagnosed? ?Tinnitus is diagnosed based on your symptoms,  your medical history, and a physical exam. Your health care provider may do a thorough hearing test (audiologic exam) if your tinnitus: ?Is unilateral. ?Causes hearing difficulties. ?Lasts 6 months or longer. ?You may work with a health care provider who specializes in hearing disorders (audiologist). You may be asked questions about your symptoms and how they affect your daily life. You may have other tests done, such as: ?CT scan. ?MRI. ?An imaging test of how blood flows through your blood vessels (angiogram). ?How is this treated? ?Treating an underlying medical condition can sometimes make tinnitus go away. If your tinnitus continues, other treatments may include: ?Therapy and counseling to help you manage the stress of living with tinnitus. ?Sound generators to mask the tinnitus. These include: ?Tabletop sound machines that play relaxing sounds to help you fall asleep. ?Wearable devices that fit in your ear and play sounds or music. ?Acoustic neural stimulation. This involves using headphones to listen to music that contains an auditory signal. Over time, listening to this signal may change some pathways in your brain and make you less sensitive to tinnitus. This treatment is used for very severe cases when no other treatment is working. ?Using hearing aids or cochlear implants if your tinnitus is related to hearing loss. Hearing aids are worn in the outer ear. Cochlear implants are surgically placed in the inner ear. ?Follow these instructions at home: ?Managing symptoms ? ?  ? ?When possible, avoid being in loud places and being exposed to loud sounds. ?Wear hearing protection, such as earplugs, when you are exposed to loud noises. ?Use a white noise machine, a humidifier, or other devices to mask the sound of tinnitus. ?  Practice techniques for reducing stress, such as meditation, yoga, or deep breathing. Work with your health care provider if you need help with managing stress. Sleep with your head  slightly raised. This may reduce the impact of tinnitus. General instructions Do not use stimulants, such as nicotine, alcohol, or caffeine. Talk with your health care provider about other stimulants to avoid. Stimulants are substances that can make you feel alert and attentive by increasing certain activities in the body (such as heart rate and blood pressure). These substances may make tinnitus worse. Take over-the-counter and prescription medicines only as told by your health care provider. Try to get plenty of sleep each night. Keep all follow-up visits. This is important. Contact a health care provider if: Your tinnitus continues for 3 weeks or longer without stopping. You develop sudden hearing loss. Your symptoms get worse or do not get better with home care. You feel you are not able to manage the stress of living with tinnitus. Get help right away if: You develop tinnitus after a head injury. You have tinnitus along with any of the following: Dizziness. Nausea and vomiting. Loss of balance. Sudden, severe headache. Vision changes. Facial weakness or weakness of arms or legs. These symptoms may represent a serious problem that is an emergency. Do not wait to see if the symptoms will go away. Get medical help right away. Call your local emergency services (911 in the [place] Do not drive yourself to the hospital. Summary Tinnitus refers to hearing a sound when there is no actual source for that sound. This is often described as ringing in the ears. Symptoms may affect only one ear (unilateral) or both ears (bilateral). Use a white noise machine, a humidifier, or other devices to mask the sound of tinnitus. Do not use stimulants, such as nicotine, alcohol, or caffeine. These substances may make tinnitus worse. This information is not intended to replace advice given to you by your health care provider. Make sure you discuss any questions you have with your health care  provider. Document Revised: 10/19/2020 Document Reviewed: 10/19/2020 Elsevier Patient Education  [place].  Increase your dose to olmesartan to 40 mg (take 2 of your 20 mg until gone) daily. Return on Tuesday for Nurse visit blood pressure check.

## 2022-06-29 NOTE — Patient Instructions (Signed)
Visit Information It was great speaking with you today!  Please let me know if you have any questions about our visit.   Goals Addressed             This Visit's Progress    Manage My Medicine       Timeframe:  Long-Range Goal Priority:  High Start Date:                             Expected End Date:                       Follow Up Date : None noted at this time patient at goal    In Progress:   - call for medicine refill 2 or 3 days before it runs out - call if I am sick and can't take my medicine - keep a list of all the medicines I take; vitamins and herbals too - learn to read medicine labels - use a pillbox to sort medicine - use an alarm clock or phone to remind me to take my medicine    Why is this important?   These steps will help you keep on track with your medicines.  Note: Please call if you have any questions         Patient Care Plan: CCM Pharmacy Care Plan     Problem Identified: HTN, DM II      Long-Range Goal: Disease Management   Recent Progress: On track  Note:   Current Barriers:  Does not adhere to prescribed medication regimen  Pharmacist Clinical Goal(s):  Patient will achieve adherence to monitoring guidelines and medication adherence to achieve therapeutic efficacy through collaboration with PharmD and provider.   Interventions: 1:1 collaboration with [name], FNP regarding development and update of comprehensive plan of care as evidenced by provider attestation and co-signature Inter-disciplinary care team collaboration (see longitudinal plan of care) Comprehensive medication review performed; medication list updated in electronic medical record  Hypertension (BP goal <130/80) -Controlled -Current treatment: Olmesartan 40 mg tablet once per day Appropriate, Query effective Patient reports she did not start taking the 40 mg tablet nor did she pick up the medication  -Current home readings: 128/72 -Current dietary habits: she  is avoiding salt in her food  -Denies hypotensive/hypertensive symptoms - Patient reports that she is not currently taking 40 mg tablet, she is only taking 20 mg tablet because of her concern with a low BP -Pharmacist team will be calling her next week to see how she is doing. -She is going to start taking the 40 mg tablet once per week on Monday, picking it up from the pharmacy today, [facility] to call patient to review BP reading on Tuesday and an additional follow up call on Friday.  -Noticed BP reading was elevated when patient presented to the office but when rechecked was <130/80 -Educated on Daily salt intake goal < 2300 mg; Exercise goal of 150 minutes per week; -Counseled to monitor BP at home at least once per week, document, and provide log at future appointments -Recommended to continue current medication  Diabetes (A1c goal <7%) -Controlled -Current medications: Metformin 500 mg tablet once per day Appropriate, Effective, Safe, Accessible -Current home glucose readings fasting glucose: 89 -Denies hypoglycemic/hyperglycemic symptoms -Current meal patterns:  drinks: she is drinking plenty of water  -Current exercise: she does a lot of walking  -Educated on Exercise goal of 150  minutes per week; -Counseled to check feet daily and get yearly eye exams -Recommended to continue current medication   Patient Goals/Self-Care Activities Patient will:  - take medications as prescribed as evidenced by patient report and record review  Follow Up Plan: The patient has been provided with contact information for the care management team and has been advised to call with any health related questions or concerns.        Patient agreed to services and verbal consent obtained.   The patient verbalized understanding of instructions, educational materials, and care plan provided today and agreed to receive a mailed copy of patient instructions, educational materials, and care plan.   [name], PharmD Clinical Pharmacist [facility] [phone]

## 2022-06-29 NOTE — Progress Notes (Signed)
Chronic Care Management Pharmacy Note  06/29/2022 Name:  Allison Franco MRN:  [id number] DOB:  05/16/1941  Summary: Patient reports that   Recommendations/Changes made from today's visit: Recommend patient check BP everyday and write readings in a log book.   Plan: Patient reports that she is still taking 20 mg tablet, but will start taking 40 mg tablet. She is concerned that her BP might be too low    Subjective: Allison Franco is an 81 y.o. year old female who is a primary patient of [name], [place].  The CCM team was consulted for assistance with disease management and care coordination needs.    Engaged with patient by telephone for follow up visit in response to provider referral for pharmacy case management and/or care coordination services.   Consent to Services:  The patient was given information about Chronic Care Management services, agreed to services, and gave verbal consent prior to initiation of services.  Please see initial visit note for detailed documentation.   Patient Care Team: [name], FNP as PCP - General (General Practice) Mayford Knife, [facility] (Pharmacist)  Recent office visits: 06/29/2022 - Office visit for tinnitus   Recent consult visits: None noted   Hospital visits: 06/07/2022 - ED visit    Objective:  Lab Results  Component Value Date   CREATININE 0.98 06/07/2022   BUN 22 06/07/2022   EGFR 72 05/03/2022   GFRNONAA 58 (L) 06/07/2022   GFRAA 78 11/11/2020   NA 136 06/07/2022   K 4.4 06/07/2022   CALCIUM 8.8 (L) 06/07/2022   CO2 24 06/07/2022   GLUCOSE 103 (H) 06/07/2022    Lab Results  Component Value Date/Time   HGBA1C 5.7 (H) 05/03/2022 10:14 AM   HGBA1C 5.7 (H) 12/08/2021 10:24 AM   MICROALBUR 10 01/20/2021 12:36 PM    Last diabetic Eye exam:  Lab Results  Component Value Date/Time   HMDIABEYEEXA No Retinopathy 07/15/2020 12:00 AM    Last diabetic Foot exam: No results found for: "HMDIABFOOTEX"   Lab Results   Component Value Date   CHOL 160 05/03/2022   HDL 40 05/03/2022   LDLCALC 92 05/03/2022   TRIG 161 (H) 05/03/2022   CHOLHDL 4.0 05/03/2022       Latest Ref Rng & Units 04/19/2021    9:59 AM 04/05/2020    7:01 AM 01/15/2020   11:34 AM  Hepatic Function  Total Protein 6.0 - 8.5 g/dL 7.2  6.5  7.0   Albumin 3.7 - 4.7 g/dL 4.4  3.4  4.0   AST 0 - 40 IU/L '20  22  16   ' ALT 0 - 32 IU/L '20  20  19   ' Alk Phosphatase 44 - 121 IU/L 109  81  111   Total Bilirubin 0.0 - 1.2 mg/dL 0.4  0.7  0.2     Lab Results  Component Value Date/Time   TSH 1.995 07/26/2021 04:20 PM   TSH 1.298 07/12/2018 07:58 AM   FREET4 1.26 07/12/2018 08:37 AM       Latest Ref Rng & Units 06/07/2022    1:37 AM 12/08/2021   10:24 AM 07/26/2021    4:10 PM  CBC  WBC 4.0 - 10.5 K/uL 7.2  4.5  6.9   Hemoglobin 12.0 - 15.0 g/dL 12.6  12.9  13.0   Hematocrit 36.0 - 46.0 % 40.3  40.2  42.2   Platelets 150 - 400 K/uL 241  255  272     Lab Results  Component Value Date/Time   VD25OH 59.23 07/26/2021 04:10 PM    Clinical ASCVD: No  The ASCVD Risk score (Arnett DK, et al., 2019) failed to calculate for the following reasons:   The 2019 ASCVD risk score is only valid for ages 30 to 32       12/09/2021   10:41 AM 02/24/2021   10:24 AM 01/20/2021   11:44 AM  Depression screen PHQ 2/9  Decreased Interest 0 0 0  Down, Depressed, Hopeless 0 0 0  PHQ - 2 Score 0 0 0     Social History   Tobacco Use  Smoking Status Never  Smokeless Tobacco Never   BP Readings from Last 3 Encounters:  06/29/22 128/72  06/07/22 130/72  05/03/22 132/70   Pulse Readings from Last 3 Encounters:  06/29/22 78  06/07/22 98  05/03/22 73   Wt Readings from Last 3 Encounters:  06/29/22 174 lb 6.4 oz (79.1 kg)  05/03/22 174 lb (78.9 kg)  01/24/22 174 lb 12.8 oz (79.3 kg)   BMI Readings from Last 3 Encounters:  06/29/22 30.89 kg/m  05/03/22 30.82 kg/m  01/24/22 30.96 kg/m    Assessment/Interventions: Review of patient past  medical history, allergies, medications, health status, including review of consultants reports, laboratory and other test data, was performed as part of comprehensive evaluation and provision of chronic care management services.   SDOH:  (Social Determinants of Health) assessments and interventions performed: No  SDOH Screenings   Alcohol Screen: Low Risk  (04/11/2019)   Alcohol Screen    Last Alcohol Screening Score (AUDIT): 0  Depression (PHQ2-9): Low Risk  (12/09/2021)   Depression (PHQ2-9)    PHQ-2 Score: 0  Financial Resource Strain: Low Risk  (12/09/2021)   Overall Financial Resource Strain (CARDIA)    Difficulty of Paying Living Expenses: Not hard at all  Food Insecurity: No Food Insecurity (12/09/2021)   Hunger Vital Sign    Worried About Running Out of Food in the Last Year: Never true    [place] in the Last Year: Never true  Housing: High Risk (04/11/2019)   Housing    Last Housing Risk Score: 2  Physical Activity: Sufficiently Active (12/09/2021)   Exercise Vital Sign    Days of Exercise per Week: 7 days    Minutes of Exercise per Session: 30 min  Social Connections: Not on file  Stress: No Stress Concern Present (12/09/2021)   [place]    Feeling of Stress : Not at all  Tobacco Use: Low Risk  (06/29/2022)   Patient History    Smoking Tobacco Use: Never    Smokeless Tobacco Use: Never    Passive Exposure: Not on file  Transportation Needs: No Transportation Needs (12/09/2021)   PRAPARE - Transportation    Lack of Transportation (Medical): No    Lack of Transportation (Non-Medical): No    CCM Care Plan  Allergies  Allergen Reactions   Aspirin Other (See Comments)    insomnia   Hydroxyzine Other (See Comments)    insomnia   Valium [Diazepam] Other (See Comments)    Insomnia, 05/10/16 pt states she took as young person- made her depressed    Medications Reviewed Today     Reviewed by  [name], FNP (Family Nurse Practitioner) on 06/29/22 at [place] List Status: <None>   Medication Order Taking? Sig Documenting Provider Last Dose Status Informant  acetaminophen (TYLENOL) 500 MG tablet [id number] Yes  Take 1 tablet (500 mg total) by mouth every 6 (six) hours as needed for mild pain, moderate pain or headache. Reported on 05/10/2016 Kalman Drape, PA Taking Active   Alcohol Swabs (ALCOHOL WIPES) 70 % PADS [id number] Yes by Does not apply route. Use with blood sugar check and injection of insulin [provider] Taking Active Self  benzonatate (TESSALON) 100 MG capsule [id number] Yes Take 1 capsule (100 mg total) by mouth 3 (three) times daily as needed for cough. Barrett Henle, MD Taking Active   blood glucose meter kit and supplies KIT [id number] Yes Dispense based on patient and insurance preference. Use up to four times daily as directed. (FOR ICD-9 250.00, 250.01). Florencia Reasons, MD Taking Active Self  Cholecalciferol (VITAMIN D3) 25 MCG (1000 UT) CAPS [id number] Yes Take 1 capsule (1,000 Units total) by mouth daily. [name], FNP Taking Active   cloNIDine (CATAPRES) 0.1 MG tablet [id number] Yes Take 1 tablet by mouth daily if systolic blood pressure over 160. If remains elevated call to office [name], FNP Taking Active   diclofenac Sodium (VOLTAREN) 1 % GEL [id number] Yes Apply 2 g topically 4 (four) times daily. [name], FNP Taking Active   dorzolamide-timolol (COSOPT) 22.3-6.8 MG/ML ophthalmic solution [id number] Yes Instill 1 drop into both eyes twice a day  Taking Active   glucose blood (ACCU-CHEK GUIDE) test strip [id number] Yes TEST 3 TIMES DAILY [name], FNP Taking Active   ibuprofen (ADVIL) 400 MG tablet [id number] Yes Take 1 tablet (400 mg total) by mouth every 8 (eight) hours as needed (pain). Barrett Henle, MD Taking Active   meclizine (ANTIVERT) 12.5 MG tablet [id number] Yes Take 1 tablet (12.5 mg total) by mouth 2 (two) times daily as  needed for dizziness. Godfrey Pick, MD Taking Active   MELATONIN PO [id number] Yes Take by mouth. [provider] Taking Active   metFORMIN (GLUCOPHAGE) 500 MG tablet [id number] Yes Take 1 tablet (500 mg total) by mouth daily with breakfast. [name], FNP Taking Active   Multiple Vitamins-Minerals (CENTRUM SILVER 50+WOMEN) TABS [id number] Yes Take 1 tablet by mouth daily. [provider] Taking Active Self  olmesartan (BENICAR) 20 MG tablet [id number] Yes Take 1 tablet (20 mg total) by mouth daily. [name], FNP Taking Active   traZODone (DESYREL) 50 MG tablet [id number] Yes Take 1 tablet (50 mg total) by mouth at bedtime. Bary Castilla, NP Taking Active   Zoster Vaccine Adjuvanted [facility]) injection [id number] No Inject 0.5 mLs into the muscle once for 1 dose. Administer 2nd dose in 2-6 months  Please fax when each dose administered [phone]  Patient not taking: Reported on 06/29/2022   [name], [place] Not Taking Active             Patient Active Problem List   Diagnosis Date Noted   Unilateral primary osteoarthritis, left knee 05/18/2022   Primary open angle glaucoma (POAG) of both eyes, severe stage 07/16/2020   Inguinal hernia with bowel obstruction 08/07/2019   Incarcerated left inguinal hernia 08/07/2019   Anemia 08/31/2018   Essential hypertension 07/03/2018   Type 2 diabetes mellitus without complication, without long-term current use of insulin ([place]) 07/02/2018   Obesity 10/20/2013   ALLERGIC RHINITIS 08/11/2009   INSOMNIA 07/17/2008    Immunization History  Administered Date(s) Administered   Fluad Quad(high Dose 65+) 11/11/2020, 08/23/2021   Influenza Whole 11/18/2009   Influenza-Unspecified 09/24/2018   PFIZER(Purple Top)SARS-COV-2 Vaccination 02/14/2020, 03/10/2020, 10/28/2020   Pfizer Covid-19 Office manager  73yr & up 01/27/2022   Pneumococcal Conjugate-13 01/16/2020   Pneumococcal Polysaccharide-23 02/01/2007   Td  02/01/2007   Tdap 01/09/2020   Zoster Recombinat (Shingrix) 08/25/2021, 11/03/2021    Conditions to be addressed/monitored:  Hypertension and Diabetes  Care Plan : [place] Updates made by PMayford Knife RPH since 06/29/2022 12:00 AM     Problem: HTN, DM II      Long-Range Goal: Disease Management   Recent Progress: On track  Note:   Current Barriers:  Does not adhere to prescribed medication regimen  Pharmacist Clinical Goal(s):  Patient will achieve adherence to monitoring guidelines and medication adherence to achieve therapeutic efficacy through collaboration with PharmD and provider.   Interventions: 1:1 collaboration with MMinette Brine FNP regarding development and update of comprehensive plan of care as evidenced by provider attestation and co-signature Inter-disciplinary care team collaboration (see longitudinal plan of care) Comprehensive medication review performed; medication list updated in electronic medical record  Hypertension (BP goal <130/80) -Controlled -Current treatment: Olmesartan 40 mg tablet once per day Appropriate, Query effective Patient reports she did not start taking the 40 mg tablet nor did she pick up the medication  -Current home readings: 128/72 -Current dietary habits: she is avoiding salt in her food  -Denies hypotensive/hypertensive symptoms - Patient reports that she is not currently taking 40 mg tablet, she is only taking 20 mg tablet because of her concern with a low BP -Pharmacist team will be calling her next week to see how she is doing. -She is going to start taking the 40 mg tablet once per week on Monday, [facility] [place] [facility] to review BP reading on Tuesday and an additional follow up call on Friday.  -Noticed BP reading was elevated when patient presented to the office but when rechecked was <130/80 -Educated on Daily salt intake goal < 2300 mg; Exercise goal of 150 minutes  per week; -Counseled to monitor BP at home at least once per week, document, and provide log at future appointments -Recommended to continue current medication  Diabetes (A1c goal <7%) -Controlled -Current medications: Metformin 500 mg tablet once per day Appropriate, Effective, Safe, Accessible -Current home glucose readings fasting glucose: 89 -Denies hypoglycemic/hyperglycemic symptoms -Current meal patterns:  drinks: she is drinking plenty of water  -Current exercise: she does a lot of walking  -Educated on Exercise goal of 150 minutes per week; -Counseled to check feet daily and get yearly eye exams -Recommended to continue current medication    Patient Goals/Self-Care Activities Patient will:  - take medications as prescribed as evidenced by patient report and record review  Follow Up Plan: The patient has been provided with contact information for the care management team and has been advised to call with any health related questions or concerns.        Medication Assistance: None required.  Patient affirms current coverage meets needs.  Compliance/Adherence/Medication fill history: Care Gaps: COVID-19 Vaccine  Star-Rating Drugs: Metformin 500 mg tablet  Olmesartan 40 mg tablet   Patient's preferred pharmacy is:  [address]: [phone]: [address] [place]Phone: [phone]: [phone] [id number] [place]: [phone]: [address]#[phone]-  [place] [place] [address] Phone: [phone] Fax: [phone]  Uses pill box? Yes Pt endorses 80%  compliance  We discussed: Benefits of medication synchronization, packaging and delivery as well as enhanced pharmacist oversight with [employer]. Patient decided to: Continue current medication management strategy  Care Plan and Follow Up Patient Decision:  Patient agrees to Care Plan and Follow-up.  Plan: The patient has been provided with contact information for the care management team and has been advised to call with any health related questions or concerns.   [name], CPP, PharmD Clinical Pharmacist Practitioner [facility] [phone]

## 2022-07-04 ENCOUNTER — Other Ambulatory Visit (HOSPITAL_COMMUNITY): Payer: Self-pay

## 2022-07-05 ENCOUNTER — Ambulatory Visit: Payer: Medicare Other

## 2022-07-05 ENCOUNTER — Telehealth: Payer: Self-pay

## 2022-07-05 NOTE — Chronic Care Management (AMB) (Addendum)
Chronic Care Management Pharmacy Assistant   Name: Allison Franco  MRN: [id number] DOB: 12-15-1940  Reason for Encounter: Disease State/ Hypertension  Recent office visits:  None  Recent consult visits:  None  Hospital visits:  Medication Reconciliation was completed by comparing discharge summary, patient's EMR and Pharmacy list, and upon discussion with patient.  Admitted to the hospital on 06-07-2022 due to Tinnitus of both ears.  Discharge date was 06-07-2022. Discharged from [place]?Medications Started at [facility] Discharge:?? Meclizine 12.5 mg twice daily PRN  Medication Changes at Hospital Discharge: None  Medications Discontinued at Hospital Discharge: None  Medications that remain the same after Hospital Discharge:??  -All other medications will remain the same.    Medications: Outpatient Encounter Medications as of 07/05/2022  Medication Sig   acetaminophen (TYLENOL) 500 MG tablet Take 1 tablet (500 mg total) by mouth every 6 (six) hours as needed for mild pain, moderate pain or headache. Reported on 05/10/2016   Alcohol Swabs (ALCOHOL WIPES) 70 % PADS by Does not apply route. Use with blood sugar check and injection of insulin   blood glucose meter kit and supplies KIT Dispense based on patient and insurance preference. Use up to four times daily as directed. (FOR ICD-9 250.00, 250.01).   Cholecalciferol (VITAMIN D3) 25 MCG (1000 UT) CAPS Take 1 capsule (1,000 Units total) by mouth daily.   cloNIDine (CATAPRES) 0.1 MG tablet Take 1 tablet by mouth daily if systolic blood pressure over 160. If remains elevated call to office   diclofenac Sodium (VOLTAREN) 1 % GEL Apply 2 g topically 4 (four) times daily.   dorzolamide-timolol (COSOPT) 22.3-6.8 MG/ML ophthalmic solution Instill 1 drop into both eyes twice a day (Patient not taking: Reported on 06/29/2022)   glucose blood (ACCU-CHEK GUIDE) test strip TEST 3 TIMES DAILY   meclizine (ANTIVERT) 12.5 MG  tablet Take 1 tablet (12.5 mg total) by mouth 2 (two) times daily as needed for dizziness. (Patient not taking: Reported on 06/29/2022)   MELATONIN PO Take by mouth.   metFORMIN (GLUCOPHAGE) 500 MG tablet Take 1 tablet (500 mg total) by mouth daily with breakfast.   Multiple Vitamins-Minerals (CENTRUM SILVER 50+WOMEN) TABS Take 1 tablet by mouth daily.   olmesartan (BENICAR) 40 MG tablet Take 1 tablet (40 mg total) by mouth daily.   traZODone (DESYREL) 50 MG tablet Take 1 tablet (50 mg total) by mouth at bedtime.   Zoster Vaccine Adjuvanted [facility]) injection Inject 0.5 mLs into the muscle once for 1 dose. Administer 2nd dose in 2-6 months  Please fax when each dose administered [phone] (Patient not taking: Reported on 06/29/2022)   No facility-administered encounter medications on file as of 07/05/2022.  Reviewed chart prior to disease state call. Spoke with patient regarding BP  Recent Office Vitals: BP Readings from Last 3 Encounters:  06/29/22 128/72  06/07/22 130/72  05/03/22 132/70   Pulse Readings from Last 3 Encounters:  06/29/22 78  06/07/22 98  05/03/22 73    Wt Readings from Last 3 Encounters:  06/29/22 174 lb 6.4 oz (79.1 kg)  05/03/22 174 lb (78.9 kg)  01/24/22 174 lb 12.8 oz (79.3 kg)     Kidney Function Lab Results  Component Value Date/Time   CREATININE 0.98 06/07/2022 01:37 AM   CREATININE 0.82 05/03/2022 10:14 AM   GFRNONAA 58 (L) 06/07/2022 01:37 AM   GFRAA 78 11/11/2020 12:32 PM       Latest Ref Rng & Units 06/07/2022  1:37 AM 05/03/2022   10:14 AM 12/08/2021   10:24 AM  BMP  Glucose 70 - 99 mg/dL 103  136  114   BUN 8 - 23 mg/dL _0 Creatinine 0.44 - 1.00 mg/dL 0.98  0.82  0.84   BUN/Creat Ratio 12 - _1 Sodium 135 - 145 mmol/L 136  140  138   Potassium 3.5 - 5.1 mmol/L 4.4  4.5  4.7   Chloride 98 - 111 mmol/L 102  102  100   CO2 22 - 32 mmol/L _2 Calcium 8.9 - 10.3 mg/dL 8.8  9.0  9.0     Current antihypertensive  regimen:  Olmesartan 40 mg daily  How often are you checking your Blood Pressure? daily  Current home BP readings: 133/70  What recent interventions/DTPs have been made by any provider to improve Blood Pressure control since last CPP Visit:  Educated on Daily salt intake goal < 2300 mg; Exercise goal of 150 minutes per week; -Counseled to monitor BP at home at least once per week, document, and provide log at future appointments -Recommended to continue current medication  Any recent hospitalizations or ED visits since last visit with CPP? No  What diet changes have been made to improve Blood Pressure Control?  Patient stated she limits salt intake and drinks plenty of water daily.  What exercise is being done to improve your Blood Pressure Control?  Patient stated she walks daily  Adherence Review: Is the patient currently on ACE/ARB medication? Yes Does the patient have >5 day gap between last estimated fill dates? No  NOTES: Patient wasn't home to give BP readings from 08-03 to 08-07. Patient will call back. Confirmed that she picked up Olmesartan 40 mg. Patient stated she could not find readings.  Care Gaps: Covid booster overdue AWV 12-21-2022  Star Rating Drugs: Metformin 500 mg- Last filled 01-12-2022 200 DS [facility] (Patient has plenty of supply left due to dose changing from twice daily to once daily.) Omesartan 40 mg- Last filled 07-04-2022 90 DS   [place] Clinical Pharmacist Assistant [phone]

## 2022-07-09 ENCOUNTER — Encounter (HOSPITAL_COMMUNITY): Payer: Self-pay | Admitting: *Deleted

## 2022-07-09 ENCOUNTER — Emergency Department (HOSPITAL_COMMUNITY)
Admission: EM | Admit: 2022-07-09 | Discharge: 2022-07-10 | Disposition: A | Discharge status: Home / Self Care | Payer: Medicare Other | Attending: Emergency Medicine | Admitting: Emergency Medicine

## 2022-07-09 ENCOUNTER — Other Ambulatory Visit: Payer: Self-pay

## 2022-07-09 DIAGNOSIS — E119 Type 2 diabetes mellitus without complications: Secondary | ICD-10-CM | POA: Insufficient documentation

## 2022-07-09 DIAGNOSIS — Z79899 Other long term (current) drug therapy: Secondary | ICD-10-CM | POA: Diagnosis not present

## 2022-07-09 DIAGNOSIS — E871 Hypo-osmolality and hyponatremia: Secondary | ICD-10-CM | POA: Diagnosis not present

## 2022-07-09 DIAGNOSIS — I10 Essential (primary) hypertension: Secondary | ICD-10-CM

## 2022-07-09 DIAGNOSIS — Z7984 Long term (current) use of oral hypoglycemic drugs: Secondary | ICD-10-CM | POA: Insufficient documentation

## 2022-07-09 LAB — CBC WITH DIFFERENTIAL/PLATELET
Abs Immature Granulocytes: 0.02 10*3/uL (ref 0.00–0.07)
Basophils Absolute: 0 10*3/uL (ref 0.0–0.1)
Basophils Relative: 1 %
Eosinophils Absolute: 0.1 10*3/uL (ref 0.0–0.5)
Eosinophils Relative: 2 %
HCT: 40.3 % (ref 36.0–46.0)
Hemoglobin: 12.7 g/dL (ref 12.0–15.0)
Immature Granulocytes: 0 %
Lymphocytes Relative: 31 %
Lymphs Abs: 2 10*3/uL (ref 0.7–4.0)
MCH: 29.1 pg (ref 26.0–34.0)
MCHC: 31.5 g/dL (ref 30.0–36.0)
MCV: 92.4 fL (ref 80.0–100.0)
Monocytes Absolute: 0.6 10*3/uL (ref 0.1–1.0)
Monocytes Relative: 9 %
Neutro Abs: 3.5 10*3/uL (ref 1.7–7.7)
Neutrophils Relative %: 57 %
Platelets: 247 10*3/uL (ref 150–400)
RBC: 4.36 MIL/uL (ref 3.87–5.11)
RDW: 11.9 % (ref 11.5–15.5)
WBC: 6.3 10*3/uL (ref 4.0–10.5)
nRBC: 0 % (ref 0.0–0.2)

## 2022-07-09 LAB — BASIC METABOLIC PANEL
Anion gap: 7 (ref 5–15)
BUN: 20 mg/dL (ref 8–23)
CO2: 24 mmol/L (ref 22–32)
Calcium: 8.5 mg/dL — ABNORMAL LOW (ref 8.9–10.3)
Chloride: 99 mmol/L (ref 98–111)
Creatinine, Ser: 0.78 mg/dL (ref 0.44–1.00)
GFR, Estimated: >60 mL/min (ref >60)
Glucose, Bld: 104 mg/dL — ABNORMAL HIGH (ref 70–99)
Potassium: 4.2 mmol/L (ref 3.5–5.1)
Sodium: 130 mmol/L — ABNORMAL LOW (ref 135–145)

## 2022-07-09 NOTE — ED Provider Triage Note (Signed)
Emergency Medicine Provider Triage Evaluation Note  Allison Franco , a 81 y.o. female  was evaluated in triage.  Pt complains of high blood pressure and feeling "fidgety" earlier today.  This prompted her to take her blood pressure, for which she noticed it was high.  Started new blood pressure medication 2 weeks ago.  Is afraid this may not be working.  Denies chest pain or shortness of breath.  No recent fevers, chills, or lower extremity swelling.  No recent upper respiratory infection.  Review of Systems  Positive:  Negative: See above  Physical Exam  BP (!) 177/90   Pulse 75   Temp 97.7 F (36.5 C) (Oral)   Resp 16   SpO2 96%  Gen:   Awake, no distress   Resp:  Normal effort  MSK:   Moves extremities without difficulty  Other:  No lower extremity edema  Medical Decision Making  Medically screening exam initiated at 7:17 PM.  Appropriate orders placed.  Khalise Billard was informed that the remainder of the evaluation will be completed by another provider, this initial triage assessment does not replace that evaluation, and the importance of remaining in the ED until their evaluation is complete.     Prince Rome, PA-C 97/98/92 1920

## 2022-07-09 NOTE — ED Triage Notes (Signed)
Pt says that her PCP changed her BP medication about 2 weeks ago, but she say that her blood pressure is still running high. Today it was 180/101 just PTA. Pt says she was "feeling restless" so she took her BP. Endorses SOB during the day that "is better at night"

## 2022-07-09 NOTE — ED Provider Notes (Signed)
[place] EMERGENCY DEPARTMENT Provider Note   CSN: [id number] Arrival date & time: 07/09/22  1819     History {Add pertinent medical, surgical, social history, OB history to HPI:1} Chief Complaint  Patient presents with   Hypertension    Allison Franco is a 81 y.o. female.  Patient as above with significant medical history as below, including *** who presents to the ED with complaint of ***     Past Medical History:  Diagnosis Date   Anxiety    Constipation    Depression    "following husband's death"   Diabetes mellitus without complication ([place])    Hypertension    KNEE PAIN, BILATERAL 02/13/2009   Qualifier: Diagnosis of  By: Hassell Done FNP, Tori Milks     Tinnitus     Past Surgical History:  Procedure Laterality Date   ABDOMINAL HYSTERECTOMY     COLON RESECTION N/A 08/07/2019   Procedure: DIAGNOSTIC LAPAROSCOPY WITH OPEN LEFT INGUINAL HERNIA WITH MESH;  Surgeon: Johnathan Hausen, MD;  Location: [facility];  Service: General;  Laterality: N/A;   fribroid surgery       The history is provided by the patient. No language interpreter was used.  Hypertension       Home Medications Prior to Admission medications   Medication Sig Start Date End Date Taking? Authorizing Provider  acetaminophen (TYLENOL) 500 MG tablet Take 1 tablet (500 mg total) by mouth every 6 (six) hours as needed for mild pain, moderate pain or headache. Reported on 05/10/2016 08/09/19   Kalman Drape, PA  Alcohol Swabs (ALCOHOL WIPES) 70 % PADS by Does not apply route. Use with blood sugar check and injection of insulin    [provider]  blood glucose meter kit and supplies KIT Dispense based on patient and insurance preference. Use up to four times daily as directed. (FOR ICD-9 250.00, 250.01). 07/05/18   Florencia Reasons, MD  Cholecalciferol (VITAMIN D3) 25 MCG (1000 UT) CAPS Take 1 capsule (1,000 Units total) by mouth daily. 06/02/21   [name], FNP  cloNIDine (CATAPRES) 0.1 MG  tablet Take 1 tablet by mouth daily if systolic blood pressure over 160. If remains elevated call to office 06/02/21   [name], FNP  diclofenac Sodium (VOLTAREN) 1 % GEL Apply 2 g topically 4 (four) times daily. 04/29/22 04/29/23  [name], FNP  dorzolamide-timolol (COSOPT) 22.3-6.8 MG/ML ophthalmic solution Instill 1 drop into both eyes twice a day Patient not taking: Reported on 06/29/2022 10/28/21     glucose blood (ACCU-CHEK GUIDE) test strip TEST 3 TIMES DAILY 04/29/22   [name], FNP  meclizine (ANTIVERT) 12.5 MG tablet Take 1 tablet (12.5 mg total) by mouth 2 (two) times daily as needed for dizziness. Patient not taking: Reported on 06/29/2022 06/07/22   Godfrey Pick, MD  MELATONIN PO Take by mouth.    [provider]  metFORMIN (GLUCOPHAGE) 500 MG tablet Take 1 tablet (500 mg total) by mouth daily with breakfast. 04/19/22   [name], FNP  Multiple Vitamins-Minerals (CENTRUM SILVER 50+WOMEN) TABS Take 1 tablet by mouth daily.    [provider]  olmesartan (BENICAR) 40 MG tablet Take 1 tablet (40 mg total) by mouth daily. 06/29/22   [name], FNP  traZODone (DESYREL) 50 MG tablet Take 1 tablet (50 mg total) by mouth at bedtime. 12/08/21   Bary Castilla, NP  Zoster Vaccine Adjuvanted [facility]) injection Inject 0.5 mLs into the muscle once for 1 dose. Administer 2nd dose in 2-6  months  Please fax when each dose administered [phone] Patient not taking: Reported on 06/29/2022 08/23/21   [name], FNP      Allergies    Aspirin, Hydroxyzine, and Valium [diazepam]    Review of Systems   Review of Systems  Physical Exam Updated Vital Signs BP (!) 152/85 (BP Location: Left Arm)   Pulse 72   Temp 97.7 F (36.5 C) (Oral)   Resp 18   SpO2 98%  Physical Exam  ED Results / Procedures / Treatments   Labs (all labs ordered are listed, but only abnormal results are displayed) Labs Reviewed  BASIC METABOLIC PANEL - Abnormal; Notable for the following  components:      Result Value   Sodium 130 (*)    Glucose, Bld 104 (*)    Calcium 8.5 (*)    All other components within normal limits  CBC WITH DIFFERENTIAL/PLATELET    EKG EKG Interpretation  Date/Time:  Saturday July 09 2022 19:20:41 EDT Ventricular Rate:  64 PR Interval:  194 QRS Duration: 74 QT Interval:  368 QTC Calculation: 379 R Axis:   -43 Text Interpretation: Normal sinus rhythm Left axis deviation Anterolateral infarct , age undetermined Abnormal ECG When compared with ECG of 07-Jun-2022 07:48, PREVIOUS ECG IS PRESENT similar to prior tracing, no stemi Confirmed by Wynona Dove ([id number]) on 07/09/2022 11:58:35 PM  Radiology No results found.  Procedures Procedures  {Document cardiac monitor, telemetry assessment procedure when appropriate:1}  Medications Ordered in ED Medications - No data to display  ED Course/ Medical Decision Making/ A&P                           Medical Decision Making  This patient presents to the ED with chief complaint(s) of *** with pertinent past medical history of *** which further complicates the presenting complaint. The complaint involves an extensive differential diagnosis and also carries with it a high risk of complications and morbidity.    The differential diagnosis includes but not limited to ***. Serious etiologies were considered.   The initial plan is to ***   Additional history obtained: Additional history obtained from {additional history:26846} Records reviewed {records:26847}  Independent labs interpretation:  The following labs were independently interpreted: ***  Independent visualization of imaging: - I independently visualized the following imaging with scope of interpretation limited to determining acute life threatening conditions related to emergency care: ***, which revealed ***  Cardiac monitoring was reviewed and interpreted by myself which shows ***  Treatment and  Reassessment: ***  Consultation: - Consulted or discussed management/test interpretation w/ external professional: ***  Consideration for admission or further workup: Admission was considered ***  Social Determinants of health: Social History   Tobacco Use   Smoking status: Never   Smokeless tobacco: Never  Vaping Use   Vaping Use: Never used  Substance Use Topics   Alcohol use: No   Drug use: No      {Document critical care time when appropriate:1} {Document review of labs and clinical decision tools ie heart score, Chads2Vasc2 etc:1}  {Document your independent review of radiology images, and any outside records:1} {Document your discussion with family members, caretakers, and with consultants:1} {Document social determinants of health affecting pt's care:1} {Document your decision making why or why not admission, treatments were needed:1} Final Clinical Impression(s) / ED Diagnoses Final diagnoses:  None    Rx / DC Orders ED Discharge Orders     None

## 2022-07-10 DIAGNOSIS — I10 Essential (primary) hypertension: Secondary | ICD-10-CM | POA: Diagnosis not present

## 2022-07-10 MED ORDER — SODIUM CHLORIDE 1 G PO TABS
1.0000 g | ORAL_TABLET | Freq: Once | ORAL | Status: AC
  Administered 2022-07-10: 1 g via ORAL
  Filled 2022-07-10: qty 1

## 2022-07-10 MED ORDER — SODIUM CHLORIDE 1 G PO TABS
1.0000 g | ORAL_TABLET | Freq: Once | ORAL | 0 refills | Status: AC
  Filled 2022-07-10: qty 1, 1d supply, fill #0

## 2022-07-10 NOTE — Discharge Instructions (Addendum)
It was a pleasure caring for you today in the emergency department.  Please return to the emergency department for any worsening or worrisome symptoms.  Please follow-up with PCP regarding elevated blood pressure reading and mildly reduced sodium level.  Recommend repeat BMP in the next 2 to 3 days.

## 2022-07-10 NOTE — ED Notes (Signed)
RN reviewed discharge instructions with pt. Pt verbalized understanding and had no further questions. VSS upon discharge.  

## 2022-07-11 ENCOUNTER — Telehealth: Payer: Self-pay

## 2022-07-11 ENCOUNTER — Other Ambulatory Visit (HOSPITAL_COMMUNITY): Payer: Self-pay

## 2022-07-11 NOTE — Chronic Care Management (AMB) (Signed)
Chronic Care Management Pharmacy Assistant   Name: Allison Franco  MRN: [id number] DOB: 1941-08-22  Reason for Encounter: Disease State/ Hypertension  Recent office visits:  None  Recent consult visits:  None  Hospital visits:  Medication Reconciliation was completed by comparing discharge summary, patient's EMR and Pharmacy list, and upon discussion with patient.  Admitted to the hospital on 07-09-2022 due to Asymptomatic hypertension . Discharge date was 07-10-2022. Discharged from [place]?Medications Started at [facility] Discharge:?? Sodium chloride 1 gram take only 1 tablet on 07-11-2022  Medication Changes at Hospital Discharge: None  Medications Discontinued at Hospital Discharge: None  Medications that remain the same after Hospital Discharge:??  -All other medications will remain the same.    Medications: Outpatient Encounter Medications as of 07/11/2022  Medication Sig   acetaminophen (TYLENOL) 500 MG tablet Take 1 tablet (500 mg total) by mouth every 6 (six) hours as needed for mild pain, moderate pain or headache. Reported on 05/10/2016   Alcohol Swabs (ALCOHOL WIPES) 70 % PADS by Does not apply route. Use with blood sugar check and injection of insulin   blood glucose meter kit and supplies KIT Dispense based on patient and insurance preference. Use up to four times daily as directed. (FOR ICD-9 250.00, 250.01).   Cholecalciferol (VITAMIN D3) 25 MCG (1000 UT) CAPS Take 1 capsule (1,000 Units total) by mouth daily.   cloNIDine (CATAPRES) 0.1 MG tablet Take 1 tablet by mouth daily if systolic blood pressure over 160. If remains elevated call to office   diclofenac Sodium (VOLTAREN) 1 % GEL Apply 2 g topically 4 (four) times daily.   dorzolamide-timolol (COSOPT) 22.3-6.8 MG/ML ophthalmic solution Instill 1 drop into both eyes twice a day (Patient not taking: Reported on 06/29/2022)   glucose blood (ACCU-CHEK GUIDE) test strip TEST 3 TIMES DAILY    meclizine (ANTIVERT) 12.5 MG tablet Take 1 tablet (12.5 mg total) by mouth 2 (two) times daily as needed for dizziness. (Patient not taking: Reported on 06/29/2022)   MELATONIN PO Take by mouth.   metFORMIN (GLUCOPHAGE) 500 MG tablet Take 1 tablet (500 mg total) by mouth daily with breakfast.   Multiple Vitamins-Minerals (CENTRUM SILVER 50+WOMEN) TABS Take 1 tablet by mouth daily.   olmesartan (BENICAR) 40 MG tablet Take 1 tablet (40 mg total) by mouth daily.   sodium chloride 1 g tablet Take 1 tablet (1 g total) by mouth once for 1 dose.   traZODone (DESYREL) 50 MG tablet Take 1 tablet (50 mg total) by mouth at bedtime.   Zoster Vaccine Adjuvanted [facility]) injection Inject 0.5 mLs into the muscle once for 1 dose. Administer 2nd dose in 2-6 months  Please fax when each dose administered [phone] (Patient not taking: Reported on 06/29/2022)   No facility-administered encounter medications on file as of 07/11/2022.  Reviewed chart prior to disease state call. Spoke with patient regarding BP  Recent Office Vitals: BP Readings from Last 3 Encounters:  07/10/22 (!) 156/74  06/29/22 128/72  06/07/22 130/72   Pulse Readings from Last 3 Encounters:  07/10/22 69  06/29/22 78  06/07/22 98    Wt Readings from Last 3 Encounters:  06/29/22 174 lb 6.4 oz (79.1 kg)  05/03/22 174 lb (78.9 kg)  01/24/22 174 lb 12.8 oz (79.3 kg)     Kidney Function Lab Results  Component Value Date/Time   CREATININE 0.78 07/09/2022 07:43 PM   CREATININE 0.98 06/07/2022 01:37 AM   GFRNONAA >60 07/09/2022 07:43  PM   GFRAA 78 11/11/2020 12:32 PM       Latest Ref Rng & Units 07/09/2022    7:43 PM 06/07/2022    1:37 AM 05/03/2022   10:14 AM  BMP  Glucose 70 - 99 mg/dL 104  103  136   BUN 8 - 23 mg/dL _0 Creatinine 0.44 - 1.00 mg/dL 0.78  0.98  0.82   BUN/Creat Ratio 12 - 28   17   Sodium 135 - 145 mmol/L 130  136  140   Potassium 3.5 - 5.1 mmol/L 4.2  4.4  4.5   Chloride 98 - 111 mmol/L 99  102   102   CO2 22 - 32 mmol/L _1 Calcium 8.9 - 10.3 mg/dL 8.5  8.8  9.0     Current antihypertensive regimen:  Olmesartan 40 mg daily (Patient stated she started back taking 20 mg on 08-13)  How often are you checking your Blood Pressure? daily  Current home BP readings: 08-13 142/81, 08-08 164/84, 08-09 162/82, 08-10 159/84  What recent interventions/DTPs have been made by any provider to improve Blood Pressure control since last CPP Visit:  Educated on Daily salt intake goal < 2300 mg; Exercise goal of 150 minutes per week; -Counseled to monitor BP at home at least once per week, document, and provide log at future appointments -Recommended to continue current medication  Any recent hospitalizations or ED visits since last visit with CPP? Yes  What diet changes have been made to improve Blood Pressure Control?  Patient stated she limits salt intake and drinks plenty of water daily.  What exercise is being done to improve your Blood Pressure Control?  Patient stated she walks daily  Adherence Review: Is the patient currently on ACE/ARB medication? Yes Does the patient have >5 day gap between last estimated fill dates? No   Care Gaps: Covid booster overdue AWV 12-21-2022  Star Rating Drugs: Metformin 500 mg- Last filled 01-12-2022 200 DS [facility] (Next fill due 07-31-2022) Omesartan 40 mg- Last filled 07-04-2022 90 DS [place] Olmesartan 20 mg- Last filled 06-17-2022 90 DS [place]  [place] Clinical Pharmacist Assistant [phone]

## 2022-07-11 NOTE — Telephone Encounter (Signed)
Transition Care Management Unsuccessful Follow-up Telephone Call  Date of discharge and from where:  07/09/2022 [place] [facility]   Attempts:  1st Attempt  Reason for unsuccessful TCM follow-up call:  Left voice message

## 2022-07-12 ENCOUNTER — Other Ambulatory Visit (HOSPITAL_COMMUNITY): Payer: Self-pay

## 2022-07-12 ENCOUNTER — Ambulatory Visit: Payer: Medicare Other

## 2022-07-12 ENCOUNTER — Telehealth: Payer: Self-pay

## 2022-07-12 VITALS — BP 110/62 | HR 62 | Temp 98.1°F

## 2022-07-12 DIAGNOSIS — I119 Hypertensive heart disease without heart failure: Secondary | ICD-10-CM

## 2022-07-12 NOTE — Telephone Encounter (Signed)
Spoke with patient about changes in her medication regimen today, when she came in for her BP check. Allison Franco reports that she is going to pick up her medication from the pharmacy today. Reports taking Olmesartan 20 mg tablet twice per day. She reports taking 40 mg at one time is too high.   -Spoke with [place], Metformin 500 mg tablet is being filled today, patient can pick up today, patient voiced understanding.   [name], CPP, PharmD Clinical Pharmacist Practitioner [facility] [phone]

## 2022-07-12 NOTE — Progress Notes (Signed)
Patient presents today for BPC. She is taking olmesartan '20mg'$ . She states that when she took the '40mg'$  it made her blood pressure higher so she continued with the '20mg'$ .   BP Readings from Last 3 Encounters:  07/12/22 110/62  07/10/22 (!) 156/74  06/29/22 128/72  . Patient states that she has been taking '20mg'$  twice a day. Patient will continue with this regimen and return for nurse visit in 2 weeks. Pt encouraged to increase water intake and decrease salt intake.

## 2022-07-12 NOTE — Patient Instructions (Signed)

## 2022-07-13 ENCOUNTER — Other Ambulatory Visit (HOSPITAL_COMMUNITY): Payer: Self-pay

## 2022-07-19 ENCOUNTER — Telehealth: Payer: Self-pay

## 2022-07-19 NOTE — Chronic Care Management (AMB) (Signed)
Chronic Care Management Pharmacy Assistant   Name: Allison Franco  MRN: [id number] DOB: February 20, 1941  Reason for Encounter: Disease State/ Hypertension  Recent office visits:  07-12-2022 Allison Franco. BP check 110/62.  Recent consult visits:  None  Hospital visits:  Medication Reconciliation was completed by comparing discharge summary, patient's EMR and Pharmacy list, and upon discussion with patient.   Admitted to the hospital on 07-09-2022 due to Asymptomatic hypertension . Discharge date was 07-10-2022. Discharged from [place]?Medications Started at [facility] Discharge:?? Sodium chloride 1 gram take only 1 tablet on 07-11-2022   Medication Changes at Hospital Discharge: None   Medications Discontinued at Hospital Discharge: None   Medications that remain the same after Hospital Discharge:??  -All other medications will remain the same.    Medications: Outpatient Encounter Medications as of 07/19/2022  Medication Sig   acetaminophen (TYLENOL) 500 MG tablet Take 1 tablet (500 mg total) by mouth every 6 (six) hours as needed for mild pain, moderate pain or headache. Reported on 05/10/2016   Alcohol Swabs (ALCOHOL WIPES) 70 % PADS by Does not apply route. Use with blood sugar check and injection of insulin   blood glucose meter kit and supplies KIT Dispense based on patient and insurance preference. Use up to four times daily as directed. (FOR ICD-9 250.00, 250.01).   Cholecalciferol (VITAMIN D3) 25 MCG (1000 UT) CAPS Take 1 capsule (1,000 Units total) by mouth daily.   cloNIDine (CATAPRES) 0.1 MG tablet Take 1 tablet by mouth daily if systolic blood pressure over 160. If remains elevated call to office   diclofenac Sodium (VOLTAREN) 1 % GEL Apply 2 g topically 4 (four) times daily.   dorzolamide-timolol (COSOPT) 22.3-6.8 MG/ML ophthalmic solution Instill 1 drop into both eyes twice a day (Patient not taking: Reported on 06/29/2022)   glucose blood  (ACCU-CHEK GUIDE) test strip TEST 3 TIMES DAILY   meclizine (ANTIVERT) 12.5 MG tablet Take 1 tablet (12.5 mg total) by mouth 2 (two) times daily as needed for dizziness. (Patient not taking: Reported on 06/29/2022)   MELATONIN PO Take by mouth.   metFORMIN (GLUCOPHAGE) 500 MG tablet Take 1 tablet (500 mg total) by mouth daily with breakfast.   Multiple Vitamins-Minerals (CENTRUM SILVER 50+WOMEN) TABS Take 1 tablet by mouth daily.   olmesartan (BENICAR) 40 MG tablet Take 1 tablet (40 mg total) by mouth daily. (Patient taking differently: Take 20 mg by mouth in the morning and at bedtime.)   traZODone (DESYREL) 50 MG tablet Take 1 tablet (50 mg total) by mouth at bedtime.   Zoster Vaccine Adjuvanted [facility]) injection Inject 0.5 mLs into the muscle once for 1 dose. Administer 2nd dose in 2-6 months  Please fax when each dose administered [phone] (Patient not taking: Reported on 06/29/2022)   No facility-administered encounter medications on file as of 07/19/2022.  Reviewed chart prior to disease state call. Spoke with patient regarding BP  Recent Office Vitals: BP Readings from Last 3 Encounters:  07/12/22 110/62  07/10/22 (!) 156/74  06/29/22 128/72   Pulse Readings from Last 3 Encounters:  07/12/22 62  07/10/22 69  06/29/22 78    Wt Readings from Last 3 Encounters:  06/29/22 174 lb 6.4 oz (79.1 kg)  05/03/22 174 lb (78.9 kg)  01/24/22 174 lb 12.8 oz (79.3 kg)     Kidney Function Lab Results  Component Value Date/Time   CREATININE 0.78 07/09/2022 07:43 PM   CREATININE 0.98 06/07/2022 01:37 AM  GFRNONAA >60 07/09/2022 07:43 PM   GFRAA 78 11/11/2020 12:32 PM       Latest Ref Rng & Units 07/09/2022    7:43 PM 06/07/2022    1:37 AM 05/03/2022   10:14 AM  BMP  Glucose 70 - 99 mg/dL 104  103  136   BUN 8 - 23 mg/dL '20  22  14   ' Creatinine 0.44 - 1.00 mg/dL 0.78  0.98  0.82   BUN/Creat Ratio 12 - 28   17   Sodium 135 - 145 mmol/L 130  136  140   Potassium 3.5 - 5.1 mmol/L 4.2   4.4  4.5   Chloride 98 - 111 mmol/L 99  102  102   CO2 22 - 32 mmol/L '24  24  21   ' Calcium 8.9 - 10.3 mg/dL 8.5  8.8  9.0     Current antihypertensive regimen:  Olmesartan 40 mg. Take 20 mg twice daily  How often are you checking your Blood Pressure? 1-2x per week  Current home BP readings: 08-16 136/84, 08-17 138/81, 08-18 138/78  What recent interventions/DTPs have been made by any provider to improve Blood Pressure control since last CPP Visit:  Educated on Daily salt intake goal < 2300 mg; Exercise goal of 150 minutes per week; -Counseled to monitor BP at home at least once per week, document, and provide log at future appointments -Recommended to continue current medication  Any recent hospitalizations or ED visits since last visit with CPP? Yes  What diet changes have been made to improve Blood Pressure Control?  Patient stated she limits salt intake and drinks plenty of water daily.  What exercise is being done to improve your Blood Pressure Control?  Patient stated she walks daily  Adherence Review: Is the patient currently on ACE/ARB medication? Yes Does the patient have >5 day gap between last estimated fill dates? No   Care Gaps: Covid booster overdue AWV 12-21-2022  Star Rating Drugs: Metformin 500 mg- Last filled 07-12-2022 200 DS [facility]  Omesartan 40 mg- Last filled 07-04-2022 90 DS [place]  [place] Clinical Pharmacist Assistant [phone]

## 2022-07-22 ENCOUNTER — Ambulatory Visit (HOSPITAL_COMMUNITY)
Admission: EM | Admit: 2022-07-22 | Discharge: 2022-07-22 | Disposition: A | Discharge status: Home / Self Care | Payer: Medicare Other | Attending: Family Medicine | Admitting: Family Medicine

## 2022-07-22 ENCOUNTER — Encounter (HOSPITAL_COMMUNITY): Payer: Self-pay | Admitting: *Deleted

## 2022-07-22 ENCOUNTER — Ambulatory Visit (INDEPENDENT_AMBULATORY_CARE_PROVIDER_SITE_OTHER): Payer: Medicare Other

## 2022-07-22 DIAGNOSIS — M25562 Pain in left knee: Secondary | ICD-10-CM

## 2022-07-22 DIAGNOSIS — G8929 Other chronic pain: Secondary | ICD-10-CM | POA: Diagnosis not present

## 2022-07-22 DIAGNOSIS — M25511 Pain in right shoulder: Secondary | ICD-10-CM

## 2022-07-22 DIAGNOSIS — M170 Bilateral primary osteoarthritis of knee: Secondary | ICD-10-CM | POA: Diagnosis not present

## 2022-07-22 DIAGNOSIS — M25561 Pain in right knee: Secondary | ICD-10-CM

## 2022-07-22 DIAGNOSIS — M19011 Primary osteoarthritis, right shoulder: Secondary | ICD-10-CM | POA: Diagnosis not present

## 2022-07-22 NOTE — Discharge Instructions (Addendum)
You were seen today for shoulder and knee pain.  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  Your shoulder xray also shows mild arthritis which is likely causing your pain.  You may continue tylenol for pain, as well as the topical voltaren gel.   You are otherwise limited in what you can take based on your age and other medical problems.  I recommend you trial heat or ice as another option for your pain. You should follow up with your primary care provider, and discuss seeing orthopedics again for your ongoing pain.  There is little else I can do for your chronic pain today.

## 2022-07-22 NOTE — ED Provider Notes (Signed)
[place]    CSN: [id number] Arrival date & time: 07/22/22  1024      History   Chief Complaint Chief Complaint  Patient presents with   Knee Pain    HPI Allison Franco is a 81 y.o. female.   Patient is here for "sleepless nights".  She is having pain in her right shoulder pain and bilateral knee pain. She has talked to her pcp, given a gel and the pain is still there. She takes tylenol without any help.  No known injury.  Having pain for months. No swelling noted.  She feels the shoulder is "cracking";   pain mostly at night even when not using it.  The left knee pain is medial;  the right knee has pain "inside".  Had xrays done in jan of knees, both show arthritis.  She has had injections done in the past as well.   Past Medical History:  Diagnosis Date   Anxiety    Constipation    Depression    "following husband's death"   Diabetes mellitus without complication ([place])    Hypertension    KNEE PAIN, BILATERAL 02/13/2009   Qualifier: Diagnosis of  By: Hassell Done FNP, Tori Milks     Tinnitus     Patient Active Problem List   Diagnosis Date Noted   Unilateral primary osteoarthritis, left knee 05/18/2022   Primary open angle glaucoma (POAG) of both eyes, severe stage 07/16/2020   Inguinal hernia with bowel obstruction 08/07/2019   Incarcerated left inguinal hernia 08/07/2019   Anemia 08/31/2018   Essential hypertension 07/03/2018   Type 2 diabetes mellitus without complication, without long-term current use of insulin ([place]) 07/02/2018   Obesity 10/20/2013   ALLERGIC RHINITIS 08/11/2009   INSOMNIA 07/17/2008    Past Surgical History:  Procedure Laterality Date   ABDOMINAL HYSTERECTOMY     COLON RESECTION N/A 08/07/2019   Procedure: DIAGNOSTIC LAPAROSCOPY WITH OPEN LEFT INGUINAL HERNIA WITH MESH;  Surgeon: Johnathan Hausen, MD;  Location: [facility];  Service: General;  Laterality: N/A;   fribroid surgery      OB History   No obstetric history on file.       Home Medications    Prior to Admission medications   Medication Sig Start Date End Date Taking? Authorizing Provider  acetaminophen (TYLENOL) 500 MG tablet Take 1 tablet (500 mg total) by mouth every 6 (six) hours as needed for mild pain, moderate pain or headache. Reported on 05/10/2016 08/09/19  Yes Focht, Fraser Din, PA  Cholecalciferol (VITAMIN D3) 25 MCG (1000 UT) CAPS Take 1 capsule (1,000 Units total) by mouth daily. 06/02/21  Yes [name], FNP  cloNIDine (CATAPRES) 0.1 MG tablet Take 1 tablet by mouth daily if systolic blood pressure over 160. If remains elevated call to office 06/02/21  Yes [name], FNP  diclofenac Sodium (VOLTAREN) 1 % GEL Apply 2 g topically 4 (four) times daily. 04/29/22 04/29/23 Yes [name], FNP  MELATONIN PO Take by mouth.   Yes [provider]  metFORMIN (GLUCOPHAGE) 500 MG tablet Take 1 tablet (500 mg total) by mouth daily with breakfast. 04/19/22  Yes [name], FNP  Multiple Vitamins-Minerals (CENTRUM SILVER 50+WOMEN) TABS Take 1 tablet by mouth daily.   Yes [provider]  olmesartan (BENICAR) 40 MG tablet Take 1 tablet (40 mg total) by mouth daily. Patient taking differently: Take 20 mg by mouth in the morning and at bedtime. 06/29/22  Yes [name], FNP  Alcohol Swabs (ALCOHOL WIPES) 70 %  PADS by Does not apply route. Use with blood sugar check and injection of insulin    [provider]  blood glucose meter kit and supplies KIT Dispense based on patient and insurance preference. Use up to four times daily as directed. (FOR ICD-9 250.00, 250.01). 07/05/18   Florencia Reasons, MD  dorzolamide-timolol (COSOPT) 22.3-6.8 MG/ML ophthalmic solution Instill 1 drop into both eyes twice a day Patient not taking: Reported on 06/29/2022 10/28/21     glucose blood (ACCU-CHEK GUIDE) test strip TEST 3 TIMES DAILY 04/29/22   [name], FNP  meclizine (ANTIVERT) 12.5 MG tablet Take 1 tablet (12.5 mg total) by mouth 2 (two) times daily as  needed for dizziness. Patient not taking: Reported on 06/29/2022 06/07/22   Godfrey Pick, MD  traZODone (DESYREL) 50 MG tablet Take 1 tablet (50 mg total) by mouth at bedtime. 12/08/21   Bary Castilla, NP  Zoster Vaccine Adjuvanted [facility]) injection Inject 0.5 mLs into the muscle once for 1 dose. Administer 2nd dose in 2-6 months  Please fax when each dose administered [phone] Patient not taking: Reported on 06/29/2022 08/23/21   [name], FNP    Family History Family History  Problem Relation Age of Onset   Diabetes Mother    Hypertension Father    Breast cancer Neg Hx     Social History Social History   Tobacco Use   Smoking status: Never   Smokeless tobacco: Never  Vaping Use   Vaping Use: Never used  Substance Use Topics   Alcohol use: No   Drug use: No     Allergies   Aspirin, Hydroxyzine, and Valium [diazepam]   Review of Systems Review of Systems  Constitutional: Negative.   HENT: Negative.    Respiratory: Negative.    Cardiovascular: Negative.   Gastrointestinal: Negative.   Genitourinary: Negative.   Musculoskeletal:  Positive for arthralgias.     Physical Exam Triage Vital Signs ED Triage Vitals  Enc Vitals Group     BP 07/22/22 1105 124/76     Pulse Rate 07/22/22 1105 71     Resp 07/22/22 1105 18     Temp 07/22/22 1105 98.1 F (36.7 C)     Temp Source 07/22/22 1105 Oral     SpO2 07/22/22 1105 95 %     Weight --      Height --      Head Circumference --      Peak Flow --      Pain Score 07/22/22 1104 8     Pain Loc --      Pain Edu? --      Excl. in [place]? --    No data found.  Updated Vital Signs BP 124/76 (BP Location: Left Arm)   Pulse 71   Temp 98.1 F (36.7 C) (Oral)   Resp 18   SpO2 95%   Visual Acuity Right Eye Distance:   Left Eye Distance:   Bilateral Distance:    Right Eye Near:   Left Eye Near:    Bilateral Near:     Physical Exam Constitutional:      Appearance: Normal appearance.  Cardiovascular:      Rate and Rhythm: Normal rate and regular rhythm.  Pulmonary:     Effort: Pulmonary effort is normal.     Breath sounds: Normal breath sounds.  Musculoskeletal:     Comments: No TTP to the right shoulder;  full rom without pain or limitation  No deformity to the knees bilaterally;  TTP to the left medial knee along the joint line;  no TTP to the right knee;  full rom without pain or limitation.   Neurological:     General: No focal deficit present.     Mental Status: She is alert.  Psychiatric:        Mood and Affect: Mood normal.      UC Treatments / Results  Labs (all labs ordered are listed, but only abnormal results are displayed) Labs Reviewed - No data to display  EKG   Radiology DG Shoulder Right  Result Date: 07/22/2022 CLINICAL DATA:  Shoulder pain EXAM: RIGHT SHOULDER - 3 VIEW COMPARISON:  Right shoulder radiograph dated May 20, 2017 FINDINGS: No evidence of fracture or dislocation. Mild degenerative changes of the acromioclavicular joint. Soft tissues are unremarkable. IMPRESSION: No evidence of fracture or dislocation. Electronically Signed   By: Yetta Glassman M.D.   On: 07/22/2022 11:50    Procedures Procedures (including critical care time)  Medications Ordered in UC Medications - No data to display  Initial Impression / Assessment and Plan / UC Course  I have reviewed the triage vital signs and the nursing notes.  Pertinent labs & imaging results that were available during my care of the patient were reviewed by me and considered in my medical decision making (see chart for details).   Final Clinical Impressions(s) / UC Diagnoses   Final diagnoses:  Acute pain of right shoulder  Chronic pain of both knees  Arthritis of both knees     Discharge Instructions      You were seen today for shoulder and knee pain.  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  Your shoulder xray also shows mild arthritis which is likely causing your pain.  You may continue  tylenol for pain, as well as the topical voltaren gel.   You are otherwise limited in what you can take based on your age and other medical problems.  I recommend you trial heat or ice as another option for your pain. You should follow up with your primary care provider, and discuss seeing orthopedics again for your ongoing pain.  There is little else I can do for your chronic pain today.      ED Prescriptions   None    PDMP not reviewed this encounter.   Rondel Oh, MD 07/22/22 1201

## 2022-07-22 NOTE — ED Triage Notes (Signed)
Pt states that she is having right shoulder pain and bilateral knee pain X 1 month. She is taking Tylenol for the pain but its not helping. She is using Voltaren gel as well without relief.   She has a tablet for sodium chloride 1g tablet from last ER visit and she did not take yet.

## 2022-07-26 ENCOUNTER — Ambulatory Visit: Payer: Medicare Other

## 2022-07-28 DIAGNOSIS — E1159 Type 2 diabetes mellitus with other circulatory complications: Secondary | ICD-10-CM

## 2022-07-28 DIAGNOSIS — Z7984 Long term (current) use of oral hypoglycemic drugs: Secondary | ICD-10-CM

## 2022-07-28 DIAGNOSIS — I10 Essential (primary) hypertension: Secondary | ICD-10-CM

## 2022-07-29 ENCOUNTER — Telehealth: Payer: Self-pay

## 2022-07-29 NOTE — Chronic Care Management (AMB) (Signed)
Chronic Care Management Pharmacy Assistant   Name: Allison Franco  MRN: [id number] DOB: [birth year]  Reason for Encounter: Disease State/ Hypertension  Recent office visits:  None  Recent consult visits:  None  Hospital visits:  Medication Reconciliation was completed by comparing discharge summary, patient's EMR and Pharmacy list, and upon discussion with patient.  Admitted to the hospital on 07-22-2022 due to Acute pain of right shoulder. Discharge date was 07-22-2022. Discharged from [facility] health urgent care.  New?Medications Started at [facility] Discharge:?? None  Medication Changes at Hospital Discharge: None  Medications Discontinued at Hospital Discharge: Patient reported not taking meclizine and cosopt.  Medications that remain the same after Hospital Discharge:??  -All other medications will remain the same.    Hospital visits:  Medication Reconciliation was completed by comparing discharge summary, patient's EMR and Pharmacy list, and upon discussion with patient.   Admitted to the hospital on 07-09-2022 due to Asymptomatic hypertension . Discharge date was 07-10-2022. Discharged from [place]?Medications Started at [facility] Discharge:?? Sodium chloride 1 gram take only 1 tablet on 07-11-2022   Medication Changes at Hospital Discharge: None   Medications Discontinued at Hospital Discharge: None   Medications that remain the same after Hospital Discharge:??  -All other medications will remain the same.      Hospital visits:  Medication Reconciliation was completed by comparing discharge summary, patient's EMR and Pharmacy list, and upon discussion with patient.   Admitted to the hospital on 06-07-2022 due to Tinnitus of both ears.  Discharge date was 06-07-2022. Discharged from [place]?Medications Started at [facility] Discharge:?? Meclizine 12.5 mg twice daily PRN   Medication Changes at Hospital  Discharge: None   Medications Discontinued at Hospital Discharge: None   Medications that remain the same after Hospital Discharge:??  -All other medications will remain the same.     Medications: Outpatient Encounter Medications as of 07/29/2022  Medication Sig   acetaminophen (TYLENOL) 500 MG tablet Take 1 tablet (500 mg total) by mouth every 6 (six) hours as needed for mild pain, moderate pain or headache. Reported on 05/10/2016   Alcohol Swabs (ALCOHOL WIPES) 70 % PADS by Does not apply route. Use with blood sugar check and injection of insulin   blood glucose meter kit and supplies KIT Dispense based on patient and insurance preference. Use up to four times daily as directed. (FOR ICD-9 250.00, 250.01).   Cholecalciferol (VITAMIN D3) 25 MCG (1000 UT) CAPS Take 1 capsule (1,000 Units total) by mouth daily.   cloNIDine (CATAPRES) 0.1 MG tablet Take 1 tablet by mouth daily if systolic blood pressure over 160. If remains elevated call to office   diclofenac Sodium (VOLTAREN) 1 % GEL Apply 2 g topically 4 (four) times daily.   glucose blood (ACCU-CHEK GUIDE) test strip TEST 3 TIMES DAILY   MELATONIN PO Take by mouth.   metFORMIN (GLUCOPHAGE) 500 MG tablet Take 1 tablet (500 mg total) by mouth daily with breakfast.   Multiple Vitamins-Minerals (CENTRUM SILVER 50+WOMEN) TABS Take 1 tablet by mouth daily.   olmesartan (BENICAR) 40 MG tablet Take 1 tablet (40 mg total) by mouth daily. (Patient taking differently: Take 20 mg by mouth in the morning and at bedtime.)   traZODone (DESYREL) 50 MG tablet Take 1 tablet (50 mg total) by mouth at bedtime.   No facility-administered encounter medications on file as of 07/29/2022.  Reviewed chart prior to disease state call. Spoke with  patient regarding BP  Recent Office Vitals: BP Readings from Last 3 Encounters:  07/22/22 124/76  07/12/22 110/62  07/10/22 (!) 156/74   Pulse Readings from Last 3 Encounters:  07/22/22 71  07/12/22 62  07/10/22 69     Wt Readings from Last 3 Encounters:  06/29/22 174 lb 6.4 oz (79.1 kg)  05/03/22 174 lb (78.9 kg)  01/24/22 174 lb 12.8 oz (79.3 kg)     Kidney Function Lab Results  Component Value Date/Time   CREATININE 0.78 07/09/2022 07:43 PM   CREATININE 0.98 06/07/2022 01:37 AM   GFRNONAA >60 07/09/2022 07:43 PM   GFRAA 78 11/11/2020 12:32 PM       Latest Ref Rng & Units 07/09/2022    7:43 PM 06/07/2022    1:37 AM 05/03/2022   10:14 AM  BMP  Glucose 70 - 99 mg/dL 104  103  136   BUN 8 - 23 mg/dL '20  22  14   ' Creatinine 0.44 - 1.00 mg/dL 0.78  0.98  0.82   BUN/Creat Ratio 12 - 28   17   Sodium 135 - 145 mmol/L 130  136  140   Potassium 3.5 - 5.1 mmol/L 4.2  4.4  4.5   Chloride 98 - 111 mmol/L 99  102  102   CO2 22 - 32 mmol/L '24  24  21   ' Calcium 8.9 - 10.3 mg/dL 8.5  8.8  9.0     Current antihypertensive regimen:  Olmesartan 40 mg. Take 20 mg twice daily  How often are you checking your Blood Pressure? 1-2x per week  Current home BP readings: 08-20 142/81, 08-27 132/78, 08-30 141/72  What recent interventions/DTPs have been made by any provider to improve Blood Pressure control since last CPP Visit:  Educated on Daily salt intake goal < 2300 mg; Exercise goal of 150 minutes per week; -Counseled to monitor BP at home at least once per week, document, and provide log at future appointments -Recommended to continue current medication  Any recent hospitalizations or ED visits since last visit with CPP? Yes  What diet changes have been made to improve Blood Pressure Control?  Patient stated she limits salt intake and drinks plenty of water daily.  What exercise is being done to improve your Blood Pressure Control?  Patient stated she walks daily  Adherence Review: Is the patient currently on ACE/ARB medication? Yes Does the patient have >5 day gap between last estimated fill dates? No  Care Gaps: Covid booster overdue AWV 12-21-2022  Star Rating Drugs: Metformin 500 mg-  Last filled 07-12-2022 200 DS [facility]  Omesartan 40 mg- Last filled 07-04-2022 90 DS [place]  [place] Clinical Pharmacist Assistant [phone]

## 2022-08-23 ENCOUNTER — Other Ambulatory Visit (HOSPITAL_COMMUNITY): Payer: Self-pay

## 2022-08-23 ENCOUNTER — Other Ambulatory Visit: Payer: Self-pay | Admitting: Nurse Practitioner

## 2022-08-23 DIAGNOSIS — I119 Hypertensive heart disease without heart failure: Secondary | ICD-10-CM

## 2022-08-24 ENCOUNTER — Other Ambulatory Visit: Payer: Self-pay | Admitting: Nurse Practitioner

## 2022-08-24 ENCOUNTER — Other Ambulatory Visit (HOSPITAL_COMMUNITY): Payer: Self-pay

## 2022-08-24 DIAGNOSIS — I119 Hypertensive heart disease without heart failure: Secondary | ICD-10-CM

## 2022-08-24 MED ORDER — OLMESARTAN MEDOXOMIL 20 MG PO TABS
20.0000 mg | ORAL_TABLET | Freq: Two times a day (BID) | ORAL | 3 refills | Status: DC
  Filled 2022-08-24 – 2023-01-27 (×2): qty 60, 30d supply, fill #0
  Filled 2023-01-27: qty 30, 30d supply, fill #0
  Filled 2023-03-07: qty 30, 30d supply, fill #1
  Filled 2023-04-04: qty 60, 30d supply, fill #2
  Filled 2023-05-02: qty 60, 30d supply, fill #3

## 2022-08-28 ENCOUNTER — Other Ambulatory Visit: Payer: Self-pay | Admitting: Nurse Practitioner

## 2022-08-30 DIAGNOSIS — H401112 Primary open-angle glaucoma, right eye, moderate stage: Secondary | ICD-10-CM | POA: Diagnosis not present

## 2022-08-30 DIAGNOSIS — H401123 Primary open-angle glaucoma, left eye, severe stage: Secondary | ICD-10-CM | POA: Diagnosis not present

## 2022-08-30 DIAGNOSIS — E119 Type 2 diabetes mellitus without complications: Secondary | ICD-10-CM | POA: Diagnosis not present

## 2022-08-30 DIAGNOSIS — H353111 Nonexudative age-related macular degeneration, right eye, early dry stage: Secondary | ICD-10-CM | POA: Diagnosis not present

## 2022-08-30 DIAGNOSIS — H353122 Nonexudative age-related macular degeneration, left eye, intermediate dry stage: Secondary | ICD-10-CM | POA: Diagnosis not present

## 2022-08-30 DIAGNOSIS — H35373 Puckering of macula, bilateral: Secondary | ICD-10-CM | POA: Diagnosis not present

## 2022-08-30 DIAGNOSIS — H25813 Combined forms of age-related cataract, bilateral: Secondary | ICD-10-CM | POA: Diagnosis not present

## 2022-08-30 LAB — HM DIABETES EYE EXAM

## 2022-08-31 ENCOUNTER — Encounter: Payer: Self-pay | Admitting: Nurse Practitioner

## 2022-08-31 ENCOUNTER — Ambulatory Visit (INDEPENDENT_AMBULATORY_CARE_PROVIDER_SITE_OTHER): Payer: Medicare Other | Admitting: Nurse Practitioner

## 2022-08-31 ENCOUNTER — Other Ambulatory Visit (HOSPITAL_COMMUNITY): Payer: Self-pay

## 2022-08-31 VITALS — BP 122/68 | HR 69 | Temp 97.8°F | Ht 63.0 in | Wt 177.2 lb

## 2022-08-31 DIAGNOSIS — E1169 Type 2 diabetes mellitus with other specified complication: Secondary | ICD-10-CM | POA: Diagnosis not present

## 2022-08-31 DIAGNOSIS — Z6830 Body mass index (BMI) 30.0-30.9, adult: Secondary | ICD-10-CM

## 2022-08-31 DIAGNOSIS — Z23 Encounter for immunization: Secondary | ICD-10-CM | POA: Diagnosis not present

## 2022-08-31 DIAGNOSIS — E119 Type 2 diabetes mellitus without complications: Secondary | ICD-10-CM

## 2022-08-31 DIAGNOSIS — E6609 Other obesity due to excess calories: Secondary | ICD-10-CM

## 2022-08-31 DIAGNOSIS — I119 Hypertensive heart disease without heart failure: Secondary | ICD-10-CM

## 2022-08-31 DIAGNOSIS — I700 Atherosclerosis of aorta: Secondary | ICD-10-CM | POA: Diagnosis not present

## 2022-08-31 DIAGNOSIS — F5101 Primary insomnia: Secondary | ICD-10-CM

## 2022-08-31 NOTE — Patient Instructions (Addendum)
Hypertension, Adult High blood pressure (hypertension) is when the force of blood pumping through the arteries is too strong. The arteries are the blood vessels that carry blood from the heart throughout the body. Hypertension forces the heart to work harder to pump blood and may cause arteries to become narrow or stiff. Untreated or uncontrolled hypertension can lead to a heart attack, heart failure, a stroke, kidney disease, and other problems. A blood pressure reading consists of a higher number over a lower number. Ideally, your blood pressure should be below 120/80. The first ("top") number is called the systolic pressure. It is a measure of the pressure in your arteries as your heart beats. The second ("bottom") number is called the diastolic pressure. It is a measure of the pressure in your arteries as the heart relaxes. What are the causes? The exact cause of this condition is not known. There are some conditions that result in high blood pressure. What increases the risk? Certain factors may make you more likely to develop high blood pressure. Some of these risk factors are under your control, including: Smoking. Not getting enough exercise or physical activity. Being overweight. Having too much fat, sugar, calories, or salt (sodium) in your diet. Drinking too much alcohol. Other risk factors include: Having a personal history of heart disease, diabetes, high cholesterol, or kidney disease. Stress. Having a family history of high blood pressure and high cholesterol. Having obstructive sleep apnea. Age. The risk increases with age. What are the signs or symptoms? High blood pressure may not cause symptoms. Very high blood pressure (hypertensive crisis) may cause: Headache. Fast or irregular heartbeats (palpitations). Shortness of breath. Nosebleed. Nausea and vomiting. Vision changes. Severe chest pain, dizziness, and seizures. How is this diagnosed? This condition is diagnosed by  measuring your blood pressure while you are seated, with your arm resting on a flat surface, your legs uncrossed, and your feet flat on the floor. The cuff of the blood pressure monitor will be placed directly against the skin of your upper arm at the level of your heart. Blood pressure should be measured at least twice using the same arm. Certain conditions can cause a difference in blood pressure between your right and left arms. If you have a high blood pressure reading during one visit or you have normal blood pressure with other risk factors, you may be asked to: Return on a different day to have your blood pressure checked again. Monitor your blood pressure at home for 1 week or longer. If you are diagnosed with hypertension, you may have other blood or imaging tests to help your health care provider understand your overall risk for other conditions. How is this treated? This condition is treated by making healthy lifestyle changes, such as eating healthy foods, exercising more, and reducing your alcohol intake. You may be referred for counseling on a healthy diet and physical activity. Your health care provider may prescribe medicine if lifestyle changes are not enough to get your blood pressure under control and if: Your systolic blood pressure is above 130. Your diastolic blood pressure is above 80. Your personal target blood pressure may vary depending on your medical conditions, your age, and other factors. Follow these instructions at home: Eating and drinking  Eat a diet that is high in fiber and potassium, and low in sodium, added sugar, and fat. An example of this eating plan is called the DASH diet. DASH stands for Dietary Approaches to Stop Hypertension. To eat this way: Eat   plenty of fresh fruits and vegetables. Try to fill one half of your plate at each meal with fruits and vegetables. Eat whole grains, such as whole-wheat pasta, brown rice, or whole-grain bread. Fill about one  fourth of your plate with whole grains. Eat or drink low-fat dairy products, such as skim milk or low-fat yogurt. Avoid fatty cuts of meat, processed or cured meats, and poultry with skin. Fill about one fourth of your plate with lean proteins, such as fish, chicken without skin, beans, eggs, or tofu. Avoid pre-made and processed foods. These tend to be higher in sodium, added sugar, and fat. Reduce your daily sodium intake. Many people with hypertension should eat less than 1,500 mg of sodium a day. Do not drink alcohol if: Your health care provider tells you not to drink. You are pregnant, may be pregnant, or are planning to become pregnant. If you drink alcohol: Limit how much you have to: 0-1 drink a day for women. 0-2 drinks a day for men. Know how much alcohol is in your drink. In the [place] one drink equals one 12 oz bottle of beer (355 mL), one 5 oz glass of wine (148 mL), or one 1 oz glass of hard liquor (44 mL). Lifestyle  Work with your health care provider to maintain a healthy body weight or to lose weight. Ask what an ideal weight is for you. Get at least 30 minutes of exercise that causes your heart to beat faster (aerobic exercise) most days of the week. Activities may include walking, swimming, or biking. Include exercise to strengthen your muscles (resistance exercise), such as Pilates or lifting weights, as part of your weekly exercise routine. Try to do these types of exercises for 30 minutes at least 3 days a week. Do not use any products that contain nicotine or tobacco. These products include cigarettes, chewing tobacco, and vaping devices, such as e-cigarettes. If you need help quitting, ask your health care provider. Monitor your blood pressure at home as told by your health care provider. Keep all follow-up visits. This is important. Medicines Take over-the-counter and prescription medicines only as told by your health care provider. Follow directions carefully. Blood  pressure medicines must be taken as prescribed. Do not skip doses of blood pressure medicine. Doing this puts you at risk for problems and can make the medicine less effective. Ask your health care provider about side effects or reactions to medicines that you should watch for. Contact a health care provider if you: Think you are having a reaction to a medicine you are taking. Have headaches that keep coming back (recurring). Feel dizzy. Have swelling in your ankles. Have trouble with your vision. Get help right away if you: Develop a severe headache or confusion. Have unusual weakness or numbness. Feel faint. Have severe pain in your chest or abdomen. Vomit repeatedly. Have trouble breathing. These symptoms may be an emergency. Get help right away. Call 911. Do not wait to see if the symptoms will go away. Do not drive yourself to the hospital. Summary Hypertension is when the force of blood pumping through your arteries is too strong. If this condition is not controlled, it may put you at risk for serious complications. Your personal target blood pressure may vary depending on your medical conditions, your age, and other factors. For most people, a normal blood pressure is less than 120/80. Hypertension is treated with lifestyle changes, medicines, or a combination of both. Lifestyle changes include losing weight, eating a healthy,   low-sodium diet, exercising more, and limiting alcohol. This information is not intended to replace advice given to you by your health care provider. Make sure you discuss any questions you have with your health care provider. Document Revised: 09/21/2021 Document Reviewed: 09/21/2021 Elsevier Patient Education  [place].  Influenza (Flu) Vaccine (Inactivated or Recombinant): What You Need to Know 1. Why get vaccinated? Influenza vaccine can prevent influenza (flu). Flu is a contagious disease that spreads around the [place] every year,  usually between October and May. Anyone can get the flu, but it is more dangerous for some people. Infants and young children, people 59 years and older, pregnant people, and people with certain health conditions or a weakened immune system are at greatest risk of flu complications. Pneumonia, bronchitis, sinus infections, and ear infections are examples of flu-related complications. If you have a medical condition, such as heart disease, cancer, or diabetes, flu can make it worse. Flu can cause fever and chills, sore throat, muscle aches, fatigue, cough, headache, and runny or stuffy nose. Some people may have vomiting and diarrhea, though this is more common in children than adults. In an average year, thousands of people in the [place] die from flu, and many more are hospitalized. Flu vaccine prevents millions of illnesses and flu-related visits to the doctor each year. 2. Influenza vaccines CDC recommends everyone 6 months and older get vaccinated every flu season. Children 6 months through 65 years of age may need 2 doses during a single flu season. Everyone else needs only 1 dose each flu season. It takes about 2 weeks for protection to develop after vaccination. There are many flu viruses, and they are always changing. Each year a new flu vaccine is made to protect against the influenza viruses believed to be likely to cause disease in the upcoming flu season. Even when the vaccine doesn't exactly match these viruses, it may still provide some protection. Influenza vaccine does not cause flu. Influenza vaccine may be given at the same time as other vaccines. 3. Talk with your health care provider Tell your vaccination provider if the person getting the vaccine: Has had an allergic reaction after a previous dose of influenza vaccine, or has any severe, life-threatening allergies Has ever had Guillain-Barr Syndrome (also called "GBS") In some cases, your health care provider may decide to  postpone influenza vaccination until a future visit. Influenza vaccine can be administered at any time during pregnancy. People who are or will be pregnant during influenza season should receive inactivated influenza vaccine. People with minor illnesses, such as a cold, may be vaccinated. People who are moderately or severely ill should usually wait until they recover before getting influenza vaccine. Your health care provider can give you more information. 4. Risks of a vaccine reaction Soreness, redness, and swelling where the shot is given, fever, muscle aches, and headache can happen after influenza vaccination. There may be a very small increased risk of Guillain-Barr Syndrome (GBS) after inactivated influenza vaccine (the flu shot). Young children who get the flu shot along with pneumococcal vaccine (PCV13) and/or DTaP vaccine at the same time might be slightly more likely to have a seizure caused by fever. Tell your health care provider if a child who is getting flu vaccine has ever had a seizure. People sometimes faint after medical procedures, including vaccination. Tell your provider if you feel dizzy or have vision changes or ringing in the ears. As with any medicine, there is a very remote chance  of a vaccine causing a severe allergic reaction, other serious injury, or death. 5. What if there is a serious problem? An allergic reaction could occur after the vaccinated person leaves the clinic. If you see signs of a severe allergic reaction (hives, swelling of the face and throat, difficulty breathing, a fast heartbeat, dizziness, or weakness), call 9-1-1 and get the person to the nearest hospital. For other signs that concern you, call your health care provider. Adverse reactions should be reported to the Vaccine Adverse Event Reporting System (VAERS). Your health care provider will usually file this report, or you can do it yourself. Visit the VAERS website at www.vaers.SamedayNews.es or call  (704) 725-1676. VAERS is only for reporting reactions, and VAERS staff members do not give medical advice. 6. The National Vaccine Injury Compensation Program The [employer] Vaccine Injury Compensation Program (VICP) is a federal program that was created to compensate people who may have been injured by certain vaccines. Claims regarding alleged injury or death due to vaccination have a time limit for filing, which may be as short as two years. Visit the VICP website at GoldCloset.com.ee or call (832)500-9120 to learn about the program and about filing a claim. 7. How can I learn more? Ask your health care provider. Call your local or state health department. Visit the website of the Food and Drug Administration (FDA) for vaccine package inserts and additional information at TraderRating.uy. Contact the Centers for Disease Control and Prevention (CDC): Call (573) 586-7255 (1-800-CDC-INFO) or Visit CDC's website at https://gibson.com/. Source: CDC Vaccine Information Statement Inactivated Influenza Vaccine (07/03/2020) This same material is available at http://www.wolf.info/ for no charge. This information is not intended to replace advice given to you by your health care provider. Make sure you discuss any questions you have with your health care provider. Document Revised: 10/13/2021 Document Reviewed: 08/05/2021 Elsevier Patient Education  [place] can take over the counter Tumeric daily to help with your aches. Also you can get this in the spice isle and cook with it.

## 2022-08-31 NOTE — Progress Notes (Signed)
I,Tianna Badgett,acting as a Education administrator for [employer], FNP.,have documented all relevant documentation on the behalf of [name], FNP,as directed by  [name], FNP while in the presence of [name], [place].  Subjective:     Patient ID: Allison Franco , female    DOB: [birth year] , 81 y.o.   MRN: [id number]   Chief Complaint  Patient presents with   Hypertension    HPI  Patient presents today for a diabetes and blood pressure f/u.  She is seeing Dr. Dominica Severin to have her teeth removed.   Diabetes She presents for her follow-up diabetic visit. She has type 2 diabetes mellitus. Pertinent negatives for hypoglycemia include no dizziness or headaches. There are no diabetic associated symptoms. Pertinent negatives for diabetes include no chest pain, no polydipsia, no polyphagia and no polyuria. There are no hypoglycemic complications. Symptoms are stable. There are no diabetic complications. Risk factors for coronary artery disease include sedentary lifestyle and obesity. When asked about current treatments, none were reported. She is following a generally healthy diet. When asked about meal planning, she reported none. She has not had a previous visit with a dietitian. She rarely participates in exercise. (Blood sugar this morning was 101. ) An ACE inhibitor/angiotensin II receptor blocker is being taken. She does not see a podiatrist.Eye exam is not current.  Hypertension This is a chronic problem. The current episode started more than 1 year ago. The problem is controlled. Pertinent negatives include no chest pain, headaches, palpitations or shortness of breath. There are no associated agents to hypertension. Past treatments include angiotensin blockers. There are no compliance problems.  There is no history of angina. There is no history of chronic renal disease.     Past Medical History:  Diagnosis Date   Anxiety    Constipation    Depression    "following husband's death"   Diabetes  mellitus without complication ([place])    Hypertension    KNEE PAIN, BILATERAL 02/13/2009   Qualifier: Diagnosis of  By: Hassell Done FNP, Tori Milks     Tinnitus      Family History  Problem Relation Age of Onset   Diabetes Mother    Hypertension Father    Breast cancer Neg Hx      Current Outpatient Medications:    acetaminophen (TYLENOL) 500 MG tablet, Take 1 tablet (500 mg total) by mouth every 6 (six) hours as needed for mild pain, moderate pain or headache. Reported on 05/10/2016, Disp: 30 tablet, Rfl: 0   Alcohol Swabs (ALCOHOL WIPES) 70 % PADS, by Does not apply route. Use with blood sugar check and injection of insulin, Disp: , Rfl:    blood glucose meter kit and supplies KIT, Dispense based on patient and insurance preference. Use up to four times daily as directed. (FOR ICD-9 250.00, 250.01)., Disp: 1 each, Rfl: 0   Cholecalciferol (VITAMIN D3) 25 MCG (1000 UT) CAPS, Take 1 capsule (1,000 Units total) by mouth daily., Disp: 60 capsule, Rfl: 1   cloNIDine (CATAPRES) 0.1 MG tablet, Take 1 tablet by mouth daily if systolic blood pressure over 160. If remains elevated call to office, Disp: 90 tablet, Rfl: 1   diclofenac Sodium (VOLTAREN) 1 % GEL, Apply 2 g topically 4 (four) times daily., Disp: 100 g, Rfl: 2   glucose blood (ACCU-CHEK GUIDE) test strip, TEST 3 TIMES A DAY, Disp: 100 strip, Rfl: 3   MELATONIN PO, Take by mouth., Disp: , Rfl:    metFORMIN (GLUCOPHAGE) 500 MG tablet, Take  1 tablet (500 mg total) by mouth daily with breakfast., Disp: 90 tablet, Rfl: 1   Multiple Vitamins-Minerals (CENTRUM SILVER 50+WOMEN) TABS, Take 1 tablet by mouth daily., Disp: , Rfl:    olmesartan (BENICAR) 20 MG tablet, Take 1 tablet (20 mg total) by mouth in the morning and at bedtime., Disp: 60 tablet, Rfl: 3   traZODone (DESYREL) 50 MG tablet, Take 1 tablet (50 mg total) by mouth at bedtime., Disp: 30 tablet, Rfl: 2   Allergies  Allergen Reactions   Aspirin Other (See Comments)    insomnia    Hydroxyzine Other (See Comments)    insomnia   Valium [Diazepam] Other (See Comments)    Insomnia, 05/10/16 pt states she took as young person- made her depressed     Review of Systems  Constitutional: Negative.   HENT: Negative.         She has a tooth missing from lower area.   Eyes: Negative.   Respiratory: Negative.  Negative for shortness of breath.   Cardiovascular: Negative.  Negative for chest pain and palpitations.  Gastrointestinal: Negative.   Endocrine: Negative.  Negative for polydipsia, polyphagia and polyuria.  Genitourinary: Negative.   Musculoskeletal:  Positive for myalgias.       She is also having right knee pain  Skin: Negative.   Allergic/Immunologic: Negative.   Neurological: Negative.  Negative for dizziness and headaches.  Hematological: Negative.   Psychiatric/Behavioral: Negative.       Today's Vitals   08/31/22 0939  BP: 122/68  Pulse: 69  Temp: 97.8 F (36.6 C)  TempSrc: Oral  Weight: 177 lb 3.2 oz (80.4 kg)  Height: '5\' 3"'  (1.6 m)   Body mass index is 31.39 kg/m.   Objective:  Physical Exam Vitals reviewed.  Constitutional:      General: She is not in acute distress.    Appearance: Normal appearance. She is obese.  HENT:     Mouth/Throat:     Comments: Lower front tooth is missing Cardiovascular:     Rate and Rhythm: Normal rate and regular rhythm.     Pulses: Normal pulses.     Heart sounds: Normal heart sounds. No murmur heard. Pulmonary:     Effort: Pulmonary effort is normal. No respiratory distress.     Breath sounds: Normal breath sounds. No wheezing.  Skin:    General: Skin is warm and dry.     Capillary Refill: Capillary refill takes less than 2 seconds.  Neurological:     General: No focal deficit present.     Mental Status: She is alert and oriented to person, place, and time.     Cranial Nerves: No cranial nerve deficit.     Motor: No weakness.  Psychiatric:        Mood and Affect: Mood normal. Mood is not anxious.         Behavior: Behavior normal.        Thought Content: Thought content normal.        Judgment: Judgment normal.         Assessment And Plan:     1. Hypertensive heart disease without heart failure Comments: Blood pressure is well controlled, continue current medications.   2. Type 2 diabetes mellitus without complication, without long-term current use of insulin (HCC) Comments: HgbA1c is stable. Continue current medications. Diabetic foot exam done.  3. Aortic atherosclerosis (HCC) Comments: Continue statin, tolerating well.   4. Need for influenza vaccination Influenza vaccine administered Encouraged to take Tylenol  as needed for fever or muscle aches. - Flu Vaccine QUAD High Dose(Fluad)  5. Class 1 obesity due to excess calories with serious comorbidity and body mass index (BMI) of 30.0 to 30.9 in adult She is encouraged to strive for BMI less than 30 to decrease cardiac risk. Advised to aim for at least 150 minutes of exercise per week.    Patient was given opportunity to ask questions. Patient verbalized understanding of the plan and was able to repeat key elements of the plan. All questions were answered to their satisfaction.  [name], FNP   I, [name], FNP, have reviewed all documentation for this visit. The documentation on 08/31/22 for the exam, diagnosis, procedures, and orders are all accurate and complete.   IF YOU HAVE BEEN REFERRED TO A SPECIALIST, IT MAY TAKE 1-2 WEEKS TO SCHEDULE/PROCESS THE REFERRAL. IF YOU HAVE NOT HEARD FROM US/SPECIALIST IN TWO WEEKS, PLEASE GIVE [place] A CALL AT [phone] X [phone].   THE PATIENT IS ENCOURAGED TO PRACTICE SOCIAL DISTANCING DUE TO THE COVID-19 PANDEMIC.

## 2022-09-20 ENCOUNTER — Encounter: Payer: Self-pay | Admitting: Nurse Practitioner

## 2022-09-23 ENCOUNTER — Other Ambulatory Visit (HOSPITAL_COMMUNITY): Payer: Self-pay

## 2022-09-23 ENCOUNTER — Encounter (HOSPITAL_COMMUNITY): Payer: Self-pay

## 2022-09-23 ENCOUNTER — Ambulatory Visit (HOSPITAL_COMMUNITY)
Admission: EM | Admit: 2022-09-23 | Discharge: 2022-09-23 | Disposition: A | Discharge status: Home / Self Care | Payer: Medicare Other | Attending: Nurse Practitioner | Admitting: Nurse Practitioner

## 2022-09-23 DIAGNOSIS — U071 COVID-19: Secondary | ICD-10-CM | POA: Diagnosis not present

## 2022-09-23 DIAGNOSIS — R051 Acute cough: Secondary | ICD-10-CM

## 2022-09-23 DIAGNOSIS — Z79899 Other long term (current) drug therapy: Secondary | ICD-10-CM | POA: Diagnosis not present

## 2022-09-23 LAB — RESP PANEL BY RT-PCR (RSV, FLU A&B, COVID)  RVPGX2
Influenza A by PCR: NEGATIVE
Influenza B by PCR: NEGATIVE
Resp Syncytial Virus by PCR: NEGATIVE
SARS Coronavirus 2 by RT PCR: POSITIVE — AB

## 2022-09-23 MED ORDER — BENZONATATE 100 MG PO CAPS
100.0000 mg | ORAL_CAPSULE | Freq: Three times a day (TID) | ORAL | 0 refills | Status: AC
  Filled 2022-09-23: qty 30, 10d supply, fill #0

## 2022-09-23 NOTE — ED Triage Notes (Signed)
Pt c/o coughing and sneezing since yesterday and unable to sleep last night. States took tylenol today with a little relief.

## 2022-09-23 NOTE — ED Provider Notes (Signed)
[facility]    CSN: [id number] Arrival date & time: 09/23/22  1122      History   Chief Complaint Chief Complaint  Patient presents with   Cough    HPI Allison Franco is a 81 y.o. female.   HPI She is in today or cough and ear changes. She reports that she lives alone. She has noticed that she is snoring more the last few days. Denies fever, chills, nasal congestion, sneezing, runny nose, cough, new sore throat, new loss of smell or taste, shortness of breath, chest pain, nausea, or diarrhea. This has been going on for 2 day. Exposure negative COVID/Influenza/Strep. The current treatment has been OTC Tylenol.   Past Medical History:  Diagnosis Date   Anxiety    Constipation    Depression    "following husband's death"   Diabetes mellitus without complication ([place])    Hypertension    KNEE PAIN, BILATERAL 02/13/2009   Qualifier: Diagnosis of  By: Hassell Done FNP, Tori Milks     Tinnitus     Patient Active Problem List   Diagnosis Date Noted   Unilateral primary osteoarthritis, left knee 05/18/2022   Primary open angle glaucoma (POAG) of both eyes, severe stage 07/16/2020   Inguinal hernia with bowel obstruction 08/07/2019   Incarcerated left inguinal hernia 08/07/2019   Anemia 08/31/2018   Essential hypertension 07/03/2018   Type 2 diabetes mellitus without complication, without long-term current use of insulin ([place]) 07/02/2018   Obesity 10/20/2013   ALLERGIC RHINITIS 08/11/2009   INSOMNIA 07/17/2008    Past Surgical History:  Procedure Laterality Date   ABDOMINAL HYSTERECTOMY     COLON RESECTION N/A 08/07/2019   Procedure: DIAGNOSTIC LAPAROSCOPY WITH OPEN LEFT INGUINAL HERNIA WITH MESH;  Surgeon: Johnathan Hausen, MD;  Location: [facility];  Service: General;  Laterality: N/A;   fribroid surgery      OB History   No obstetric history on file.      Home Medications    Prior to Admission medications   Medication Sig Start Date End Date Taking? Authorizing  Provider  benzonatate (TESSALON) 100 MG capsule Take 1 capsule (100 mg total) by mouth every 8 (eight) hours for 10 days. 09/23/22 10/03/22 Yes Vevelyn Francois, NP  acetaminophen (TYLENOL) 500 MG tablet Take 1 tablet (500 mg total) by mouth every 6 (six) hours as needed for mild pain, moderate pain or headache. Reported on 05/10/2016 08/09/19   Kalman Drape, PA  Alcohol Swabs (ALCOHOL WIPES) 70 % PADS by Does not apply route. Use with blood sugar check and injection of insulin    [provider]  blood glucose meter kit and supplies KIT Dispense based on patient and insurance preference. Use up to four times daily as directed. (FOR ICD-9 250.00, 250.01). 07/05/18   Florencia Reasons, MD  Cholecalciferol (VITAMIN D3) 25 MCG (1000 UT) CAPS Take 1 capsule (1,000 Units total) by mouth daily. 06/02/21   [name], FNP  cloNIDine (CATAPRES) 0.1 MG tablet Take 1 tablet by mouth daily if systolic blood pressure over 160. If remains elevated call to office 06/02/21   [name], FNP  diclofenac Sodium (VOLTAREN) 1 % GEL Apply 2 g topically 4 (four) times daily. 04/29/22 04/29/23  [name], FNP  glucose blood (ACCU-CHEK GUIDE) test strip TEST 3 TIMES A DAY 08/29/22   [name], FNP  MELATONIN PO Take by mouth.    [provider]  metFORMIN (GLUCOPHAGE) 500 MG tablet Take 1 tablet (500 mg total)  by mouth daily with breakfast. 04/19/22   [name], FNP  Multiple Vitamins-Minerals (CENTRUM SILVER 50+WOMEN) TABS Take 1 tablet by mouth daily.    [provider]  olmesartan (BENICAR) 20 MG tablet Take 1 tablet (20 mg total) by mouth in the morning and at bedtime. 08/24/22   [name], FNP  traZODone (DESYREL) 50 MG tablet Take 1 tablet (50 mg total) by mouth at bedtime. 12/08/21   Bary Castilla, NP    Family History Family History  Problem Relation Age of Onset   Diabetes Mother    Hypertension Father    Breast cancer Neg Hx     Social History Social History   Tobacco Use    Smoking status: Never   Smokeless tobacco: Never  Vaping Use   Vaping Use: Never used  Substance Use Topics   Alcohol use: No   Drug use: No     Allergies   Aspirin, Hydroxyzine, and Valium [diazepam]   Review of Systems Review of Systems   Physical Exam Triage Vital Signs ED Triage Vitals  Enc Vitals Group     BP 09/23/22 1247 120/67     Pulse Rate 09/23/22 1247 69     Resp 09/23/22 1247 18     Temp 09/23/22 1247 98.6 F (37 C)     Temp src --      SpO2 --      Weight --      Height --      Head Circumference --      Peak Flow --      Pain Score 09/23/22 1248 7     Pain Loc --      Pain Edu? --      Excl. in [place]? --    No data found.  Updated Vital Signs BP 120/67 (BP Location: Right Arm)   Pulse 69   Temp 98.6 F (37 C)   Resp 18   Visual Acuity Right Eye Distance:   Left Eye Distance:   Bilateral Distance:    Right Eye Near:   Left Eye Near:    Bilateral Near:     Physical Exam Constitutional:      General: She is not in acute distress.    Appearance: She is not ill-appearing.  HENT:     Head: Normocephalic and atraumatic.     Nose: Nose normal.     Mouth/Throat:     Mouth: Mucous membranes are moist.  Cardiovascular:     Rate and Rhythm: Normal rate and regular rhythm.     Pulses: Normal pulses.     Heart sounds: Normal heart sounds.  Pulmonary:     Effort: Pulmonary effort is normal. No respiratory distress.     Breath sounds: No stridor. No wheezing, rhonchi or rales.     Comments: diminshed Chest:     Chest wall: No tenderness.  Musculoskeletal:        General: Normal range of motion.     Cervical back: Normal range of motion.  Skin:    General: Skin is warm and dry.     Capillary Refill: Capillary refill takes less than 2 seconds.  Neurological:     General: No focal deficit present.     Mental Status: She is alert and oriented to person, place, and time.  Psychiatric:        Mood and Affect: Mood normal.        Behavior:  Behavior normal.      UC  Treatments / Results  Labs (all labs ordered are listed, but only abnormal results are displayed) Labs Reviewed  RESP PANEL BY RT-PCR (RSV, FLU A&B, COVID)  RVPGX2    EKG   Radiology No results found.  Procedures Procedures (including critical care time)  Medications Ordered in UC Medications - No data to display  Initial Impression / Assessment and Plan / UC Course  I have reviewed the triage vital signs and the nursing notes.  Pertinent labs & imaging results that were available during my care of the patient were reviewed by me and considered in my medical decision making (see chart for details).    Cough Final Clinical Impressions(s) / UC Diagnoses   Final diagnoses:  Acute cough  If COVID is positive will treat with Molnupirvair 200 mg    Discharge Instructions      Your COVID, Influenza and Strep Test are all  pending.  You may have an Upper Respiratory Infection.  We encourage conservative treatment with symptom relief. We encourage you to use Tylenol alternating with Ibuprofen for your fever if not contraindicated. (Remember to use as directed do not exceed daily dosing recommendations) We also encourage salt water gargles for your sore throat. You should also consider throat lozenges and chloraseptic spray.  Your cough can be soothed with a cough suppressant. We have prescribed you a cough suppressant , Benzonatate 100 mg every 8 hours.       ED Prescriptions     Medication Sig Dispense Auth. Provider   benzonatate (TESSALON) 100 MG capsule Take 1 capsule (100 mg total) by mouth every 8 (eight) hours for 10 days. 30 capsule Vevelyn Francois, NP      PDMP not reviewed this encounter.   Dionisio David [place], [place] 09/23/22 1345

## 2022-09-23 NOTE — Discharge Instructions (Addendum)
Your COVID, Influenza and Strep Test are all  pending.  You may have an Upper Respiratory Infection.  We encourage conservative treatment with symptom relief. We encourage you to use Tylenol alternating with Ibuprofen for your fever if not contraindicated. (Remember to use as directed do not exceed daily dosing recommendations) We also encourage salt water gargles for your sore throat. You should also consider throat lozenges and chloraseptic spray.  Your cough can be soothed with a cough suppressant. We have prescribed you a cough suppressant , Benzonatate 100 mg every 8 hours.

## 2022-09-30 ENCOUNTER — Other Ambulatory Visit (HOSPITAL_COMMUNITY): Payer: Self-pay

## 2022-10-03 ENCOUNTER — Other Ambulatory Visit (HOSPITAL_COMMUNITY): Payer: Self-pay

## 2022-10-07 ENCOUNTER — Other Ambulatory Visit (HOSPITAL_COMMUNITY): Payer: Self-pay

## 2022-10-10 ENCOUNTER — Other Ambulatory Visit (HOSPITAL_COMMUNITY): Payer: Self-pay

## 2022-10-11 ENCOUNTER — Other Ambulatory Visit: Payer: Self-pay | Admitting: Nurse Practitioner

## 2022-10-11 ENCOUNTER — Other Ambulatory Visit (HOSPITAL_COMMUNITY): Payer: Self-pay

## 2022-10-11 DIAGNOSIS — I119 Hypertensive heart disease without heart failure: Secondary | ICD-10-CM

## 2022-10-12 ENCOUNTER — Other Ambulatory Visit (HOSPITAL_COMMUNITY): Payer: Self-pay

## 2022-10-31 ENCOUNTER — Other Ambulatory Visit (HOSPITAL_COMMUNITY): Payer: Self-pay

## 2022-10-31 DIAGNOSIS — H353122 Nonexudative age-related macular degeneration, left eye, intermediate dry stage: Secondary | ICD-10-CM | POA: Diagnosis not present

## 2022-10-31 DIAGNOSIS — H401123 Primary open-angle glaucoma, left eye, severe stage: Secondary | ICD-10-CM | POA: Diagnosis not present

## 2022-10-31 DIAGNOSIS — H25813 Combined forms of age-related cataract, bilateral: Secondary | ICD-10-CM | POA: Diagnosis not present

## 2022-10-31 DIAGNOSIS — H35373 Puckering of macula, bilateral: Secondary | ICD-10-CM | POA: Diagnosis not present

## 2022-10-31 DIAGNOSIS — H401112 Primary open-angle glaucoma, right eye, moderate stage: Secondary | ICD-10-CM | POA: Diagnosis not present

## 2022-10-31 DIAGNOSIS — H353111 Nonexudative age-related macular degeneration, right eye, early dry stage: Secondary | ICD-10-CM | POA: Diagnosis not present

## 2022-10-31 DIAGNOSIS — E119 Type 2 diabetes mellitus without complications: Secondary | ICD-10-CM | POA: Diagnosis not present

## 2022-10-31 MED ORDER — DORZOLAMIDE HCL-TIMOLOL MAL 2-0.5 % OP SOLN
1.0000 [drp] | Freq: Two times a day (BID) | OPHTHALMIC | 3 refills | Status: DC
  Filled 2022-10-31: qty 10, 50d supply, fill #0
  Filled 2022-12-07: qty 10, 50d supply, fill #1

## 2022-11-16 ENCOUNTER — Other Ambulatory Visit (HOSPITAL_COMMUNITY): Payer: Self-pay

## 2022-11-16 DIAGNOSIS — H401112 Primary open-angle glaucoma, right eye, moderate stage: Secondary | ICD-10-CM | POA: Diagnosis not present

## 2022-11-16 MED ORDER — PREDNISOLONE ACETATE 1 % OP SUSP
1.0000 [drp] | Freq: Two times a day (BID) | OPHTHALMIC | 0 refills | Status: DC
  Filled 2022-11-16: qty 5, 50d supply, fill #0

## 2022-11-17 ENCOUNTER — Other Ambulatory Visit (HOSPITAL_COMMUNITY): Payer: Self-pay

## 2022-11-29 ENCOUNTER — Other Ambulatory Visit (HOSPITAL_COMMUNITY): Payer: Self-pay

## 2022-11-29 DIAGNOSIS — H401123 Primary open-angle glaucoma, left eye, severe stage: Secondary | ICD-10-CM | POA: Diagnosis not present

## 2022-11-29 MED ORDER — PREDNISOLONE ACETATE 1 % OP SUSP
1.0000 [drp] | Freq: Two times a day (BID) | OPHTHALMIC | 0 refills | Status: DC
  Filled 2022-11-29 – 2022-12-07 (×2): qty 5, 50d supply, fill #0

## 2022-12-01 ENCOUNTER — Other Ambulatory Visit: Payer: Self-pay | Admitting: Nurse Practitioner

## 2022-12-01 DIAGNOSIS — Z1231 Encounter for screening mammogram for malignant neoplasm of breast: Secondary | ICD-10-CM

## 2022-12-05 ENCOUNTER — Emergency Department (HOSPITAL_COMMUNITY)
Admission: EM | Admit: 2022-12-05 | Discharge: 2022-12-05 | Disposition: A | Discharge status: Home / Self Care | Payer: 59 | Attending: Emergency Medicine | Admitting: Emergency Medicine

## 2022-12-05 ENCOUNTER — Emergency Department (HOSPITAL_COMMUNITY): Payer: 59

## 2022-12-05 DIAGNOSIS — Z1152 Encounter for screening for COVID-19: Secondary | ICD-10-CM | POA: Insufficient documentation

## 2022-12-05 DIAGNOSIS — J0120 Acute ethmoidal sinusitis, unspecified: Secondary | ICD-10-CM | POA: Diagnosis not present

## 2022-12-05 DIAGNOSIS — Z8616 Personal history of COVID-19: Secondary | ICD-10-CM | POA: Diagnosis not present

## 2022-12-05 DIAGNOSIS — Z79899 Other long term (current) drug therapy: Secondary | ICD-10-CM | POA: Diagnosis not present

## 2022-12-05 DIAGNOSIS — Z7984 Long term (current) use of oral hypoglycemic drugs: Secondary | ICD-10-CM | POA: Insufficient documentation

## 2022-12-05 DIAGNOSIS — E119 Type 2 diabetes mellitus without complications: Secondary | ICD-10-CM | POA: Insufficient documentation

## 2022-12-05 DIAGNOSIS — M791 Myalgia, unspecified site: Secondary | ICD-10-CM | POA: Diagnosis not present

## 2022-12-05 DIAGNOSIS — I6529 Occlusion and stenosis of unspecified carotid artery: Secondary | ICD-10-CM | POA: Insufficient documentation

## 2022-12-05 DIAGNOSIS — R509 Fever, unspecified: Secondary | ICD-10-CM | POA: Diagnosis not present

## 2022-12-05 DIAGNOSIS — I10 Essential (primary) hypertension: Secondary | ICD-10-CM | POA: Insufficient documentation

## 2022-12-05 DIAGNOSIS — R519 Headache, unspecified: Secondary | ICD-10-CM

## 2022-12-05 DIAGNOSIS — R059 Cough, unspecified: Secondary | ICD-10-CM | POA: Insufficient documentation

## 2022-12-05 LAB — RESP PANEL BY RT-PCR (RSV, FLU A&B, COVID)  RVPGX2
Influenza A by PCR: NEGATIVE
Influenza B by PCR: NEGATIVE
Resp Syncytial Virus by PCR: NEGATIVE
SARS Coronavirus 2 by RT PCR: NEGATIVE

## 2022-12-05 MED ORDER — AMOXICILLIN-POT CLAVULANATE 875-125 MG PO TABS: 1.0000 | ORAL_TABLET | Freq: Two times a day (BID) | ORAL | 0 refills | Status: AC

## 2022-12-05 MED ORDER — OXYMETAZOLINE HCL 0.05 % NA SOLN
1.0000 | Freq: Once | NASAL | Status: DC
  Filled 2022-12-05: qty 30

## 2022-12-05 MED ORDER — ACETAMINOPHEN 500 MG PO TABS
1000.0000 mg | ORAL_TABLET | ORAL | Status: AC
  Administered 2022-12-05: 1000 mg via ORAL
  Filled 2022-12-05: qty 2

## 2022-12-05 NOTE — ED Provider Triage Note (Cosign Needed Addendum)
Emergency Medicine Provider Triage Evaluation Note  Allison Franco , a 82 y.o. female  was evaluated in triage.  Pt complains of cough and runny nose   Review of Systems  Positive: Cough  Negative: fever  Physical Exam  BP (!) 177/80 (BP Location: Right Arm)   Pulse 95   Temp 98.1 F (36.7 C) (Oral)   Resp 16   SpO2 98%  Gen:   Awake, no distress   Resp:  Normal effort  MSK:   Moves extremities without difficulty  Other:    Medical Decision Making  Medically screening exam initiated at 3:42 PM.  Appropriate orders placed.  Allison Franco was informed that the remainder of the evaluation will be completed by another provider, this initial triage assessment does not replace that evaluation, and the importance of remaining in the ED until their evaluation is complete.  Respiratory panel ordered due to cough and fever.    Fransico Meadow, PA-C 12/05/22 1542    Fransico Meadow, [place] 01/03/23 1909

## 2022-12-05 NOTE — ED Triage Notes (Signed)
Patient complains of headache, sneezing, chills, and rhinorrhea since last night. Patient alert, oriented, and in no apparent distress at this time.

## 2022-12-05 NOTE — ED Provider Notes (Signed)
[facility] EMERGENCY DEPARTMENT Provider Note   CSN: [id number] Arrival date & time: 12/05/22  1349     History {Add pertinent medical, surgical, social history, OB history to HPI:1} Chief Complaint  Patient presents with   Headache    Allison Franco is a 82 y.o. female.  82 year old female with a history of hypertension and diabetes who presents emergency department with headache.  Patient reports that yesterday she started experiencing a headache that is frontal in nature.  Denies any fevers or neck pain.  Gradual in onset.  Also with sneezing and diffuse myalgias.  Denies any significant cough.  No nausea or vomiting or diarrhea.  Denies any vision changes, numbness or weakness of her arms or legs.  Reports that she was concerned because she does not typically get headaches.  Reports that several days ago she thinks she may have had a sick contact.        Home Medications Prior to Admission medications   Medication Sig Start Date End Date Taking? Authorizing Provider  acetaminophen (TYLENOL) 500 MG tablet Take 1 tablet (500 mg total) by mouth every 6 (six) hours as needed for mild pain, moderate pain or headache. Reported on 05/10/2016 08/09/19   Kalman Drape, PA  Alcohol Swabs (ALCOHOL WIPES) 70 % PADS by Does not apply route. Use with blood sugar check and injection of insulin    [provider]  blood glucose meter kit and supplies KIT Dispense based on patient and insurance preference. Use up to four times daily as directed. (FOR ICD-9 250.00, 250.01). 07/05/18   Florencia Reasons, MD  Cholecalciferol (VITAMIN D3) 25 MCG (1000 UT) CAPS Take 1 capsule (1,000 Units total) by mouth daily. 06/02/21   [name], FNP  cloNIDine (CATAPRES) 0.1 MG tablet Take 1 tablet by mouth daily if systolic blood pressure over 160. If remains elevated call to office 06/02/21   [name], FNP  diclofenac Sodium (VOLTAREN) 1 % GEL Apply 2 g topically 4 (four) times daily. 04/29/22  04/29/23  [name], FNP  dorzolamide-timolol (COSOPT) 2-0.5 % ophthalmic solution Instill 1 drop into both eyes twice a day 10/31/22     glucose blood (ACCU-CHEK GUIDE) test strip TEST 3 TIMES A DAY 08/29/22   [name], FNP  MELATONIN PO Take by mouth.    [provider]  metFORMIN (GLUCOPHAGE) 500 MG tablet Take 1 tablet (500 mg total) by mouth daily with breakfast. 04/19/22   [name], FNP  Multiple Vitamins-Minerals (CENTRUM SILVER 50+WOMEN) TABS Take 1 tablet by mouth daily.    [provider]  olmesartan (BENICAR) 20 MG tablet Take 1 tablet (20 mg total) by mouth in the morning and at bedtime. 08/24/22   [name], FNP  prednisoLONE acetate (PRED FORTE) 1 % ophthalmic suspension Place 1 drop into the left eye 2 (two) times daily for 5 days 11/29/22     traZODone (DESYREL) 50 MG tablet Take 1 tablet (50 mg total) by mouth at bedtime. 12/08/21   Bary Castilla, NP      Allergies    Aspirin, Hydroxyzine, and Valium [diazepam]    Review of Systems   Review of Systems  Physical Exam Updated Vital Signs BP (!) 162/83   Pulse 85   Temp 98.2 F (36.8 C)   Resp 18   SpO2 100%  Physical Exam Vitals and nursing note reviewed.  Constitutional:      General: She is not in acute distress.    Appearance: She  is well-developed.  HENT:     Head: Normocephalic and atraumatic.     Right Ear: External ear normal.     Left Ear: External ear normal.     Nose: Congestion present.     Comments: No frontal or maxillary sinus tenderness to palpation Eyes:     Extraocular Movements: Extraocular movements intact.     Conjunctiva/sclera: Conjunctivae normal.     Pupils: Pupils are equal, round, and reactive to light.  Pulmonary:     Effort: Pulmonary effort is normal. No respiratory distress.  Musculoskeletal:     Cervical back: Normal range of motion and neck supple.  Neurological:     Mental Status: She is alert and oriented to person, place, and time. Mental  status is at baseline.     Comments: MENTAL STATUS: AAOx3 CRANIAL NERVES: II: Pupils equal and reactive 3 mm BL, no RAPD, no VF deficits III, IV, VI: EOM intact, no gaze preference or deviation, no nystagmus. V: normal sensation to light touch in V1, V2, and V3 segments bilaterally VII: no facial weakness or asymmetry, no nasolabial fold flattening VIII: normal hearing to speech and finger friction IX, X: normal palatal elevation, no uvular deviation XI: 5/5 head turn and 5/5 shoulder shrug bilaterally XII: midline tongue protrusion MOTOR: 5/5 strength in R shoulder flexion, elbow flexion and extension, and grip strength. 5/5 strength in L shoulder flexion, elbow flexion and extension, and grip strength.  5/5 strength in R hip and knee flexion, knee extension, ankle plantar and dorsiflexion. 5/5 strength in L hip and knee flexion, knee extension, ankle plantar and dorsiflexion. SENSORY: Normal sensation to light touch in all extremities COORD: Normal finger to nose and heel to shin, no tremor, no dysmetria STATION: no truncal ataxia  Psychiatric:        Mood and Affect: Mood normal.     ED Results / Procedures / Treatments   Labs (all labs ordered are listed, but only abnormal results are displayed) Labs Reviewed  RESP PANEL BY RT-PCR (RSV, FLU A&B, COVID)  RVPGX2    EKG None  Radiology No results found.  Procedures Procedures  {Document cardiac monitor, telemetry assessment procedure when appropriate:1}  Medications Ordered in ED Medications - No data to display  ED Course/ Medical Decision Making/ A&P                           Medical Decision Making Amount and/or Complexity of Data Reviewed Radiology: ordered.  Risk OTC drugs.   ***  {Document critical care time when appropriate:1} {Document review of labs and clinical decision tools ie heart score, Chads2Vasc2 etc:1}  {Document your independent review of radiology images, and any outside  records:1} {Document your discussion with family members, caretakers, and with consultants:1} {Document social determinants of health affecting pt's care:1} {Document your decision making why or why not admission, treatments were needed:1} Final Clinical Impression(s) / ED Diagnoses Final diagnoses:  None    Rx / DC Orders ED Discharge Orders     None

## 2022-12-05 NOTE — Discharge Instructions (Addendum)
You were seen for your sinus infection and headache in the emergency department.   At home, please take the antibiotics we have prescribed you.   Follow-up with your primary doctor in 2-3 days regarding your visit.   Return immediately to the emergency department if you experience any of the following: worsening headache, vomiting, or any other concerning symptoms.    Thank you for visiting our Emergency Department. It was a pleasure taking care of you today.

## 2022-12-06 ENCOUNTER — Telehealth: Payer: Self-pay

## 2022-12-06 NOTE — Telephone Encounter (Signed)
Transition Care Management Unsuccessful Follow-up Telephone Call  Date of discharge and from where:  12/05/2021 [facility]   Attempts:  1st Attempt  Reason for unsuccessful TCM follow-up call:  Left voice message

## 2022-12-07 ENCOUNTER — Telehealth: Payer: Self-pay

## 2022-12-07 ENCOUNTER — Other Ambulatory Visit (HOSPITAL_COMMUNITY): Payer: Self-pay

## 2022-12-07 NOTE — Telephone Encounter (Signed)
Transition Care Management Unsuccessful Follow-up Telephone Call  Date of discharge and from where:  12/05/2022 [facility]   Attempts:  2nd Attempt  Reason for unsuccessful TCM follow-up call:  Left voice message

## 2022-12-08 ENCOUNTER — Telehealth: Payer: Self-pay

## 2022-12-08 NOTE — Telephone Encounter (Signed)
Transition Care Management Unsuccessful Follow-up Telephone Call  Date of discharge and from where:  12/05/2022 [facility]    Attempts:  3rd Attempt  Reason for unsuccessful TCM follow-up call:  Left voice message

## 2022-12-21 ENCOUNTER — Ambulatory Visit (INDEPENDENT_AMBULATORY_CARE_PROVIDER_SITE_OTHER): Payer: 59

## 2022-12-21 VITALS — BP 122/66 | HR 86 | Temp 98.3°F | Ht 64.0 in | Wt 182.0 lb

## 2022-12-21 DIAGNOSIS — Z0000 Encounter for general adult medical examination without abnormal findings: Secondary | ICD-10-CM | POA: Diagnosis not present

## 2022-12-21 NOTE — Patient Instructions (Signed)
Allison Franco , Thank you for taking time to come for your Medicare Wellness Visit. I appreciate your ongoing commitment to your health goals. Please review the following plan we discussed and let me know if I can assist you in the future.   These are the goals we discussed:  Goals       Exercise 150 min/wk Moderate Activity (pt-stated)      Manage My Medicine      Timeframe:  Long-Range Goal Priority:  High Start Date:                             Expected End Date:                       Follow Up Date : None noted at this time patient at goal    In Progress:   - call for medicine refill 2 or 3 days before it runs out - call if I am sick and can't take my medicine - keep a list of all the medicines I take; vitamins and herbals too - learn to read medicine labels - use a pillbox to sort medicine - use an alarm clock or phone to remind me to take my medicine    Why is this important?   These steps will help you keep on track with your medicines.  Note: Please call if you have any questions        Patient Stated      01/15/2020, wants to increase water intake, exercise and eat healthy      Patient Stated      02/24/2021, no goals      Patient Stated      12/09/2021, get better with exercise      Patient Stated      12/21/2022, no goals        This is a list of the screening recommended for you and due dates:  Health Maintenance  Topic Date Due   COVID-19 Vaccine (5 - 2023-24 season) 07/29/2022   Hemoglobin A1C  11/02/2022   Yearly kidney health urinalysis for diabetes  01/24/2023   Yearly kidney function blood test for diabetes  07/10/2023   Eye exam for diabetics  08/31/2023   Complete foot exam   09/01/2023   Medicare Annual Wellness Visit  12/22/2023   DTaP/Tdap/Td vaccine (3 - Td or Tdap) 01/08/2030   Pneumonia Vaccine  Completed   Flu Shot  Completed   DEXA scan (bone density measurement)  Completed   Zoster (Shingles) Vaccine  Completed   HPV Vaccine  Aged Out     Advanced directives: copy in chart  Conditions/risks identified: non  Next appointment: Follow up in one year for your annual wellness visit    Preventive Care 41 Years and Older, Female Preventive care refers to lifestyle choices and visits with your health care provider that can promote health and wellness. What does preventive care include? A yearly physical exam. This is also called an annual well check. Dental exams once or twice a year. Routine eye exams. Ask your health care provider how often you should have your eyes checked. Personal lifestyle choices, including: Daily care of your teeth and gums. Regular physical activity. Eating a healthy diet. Avoiding tobacco and drug use. Limiting alcohol use. Practicing safe sex. Taking low-dose aspirin every day. Taking vitamin and mineral supplements as recommended by your health care provider. What happens during  an annual well check? The services and screenings done by your health care provider during your annual well check will depend on your age, overall health, lifestyle risk factors, and family history of disease. Counseling  Your health care provider may ask you questions about your: Alcohol use. Tobacco use. Drug use. Emotional well-being. Home and relationship well-being. Sexual activity. Eating habits. History of falls. Memory and ability to understand (cognition). Work and work Statistician. Reproductive health. Screening  You may have the following tests or measurements: Height, weight, and BMI. Blood pressure. Lipid and cholesterol levels. These may be checked every 5 years, or more frequently if you are over 10 years old. Skin check. Lung cancer screening. You may have this screening every year starting at age 13 if you have a 30-pack-year history of smoking and currently smoke or have quit within the past 15 years. Fecal occult blood test (FOBT) of the stool. You may have this test every year starting  at age 65. Flexible sigmoidoscopy or colonoscopy. You may have a sigmoidoscopy every 5 years or a colonoscopy every 10 years starting at age 55. Hepatitis C blood test. Hepatitis B blood test. Sexually transmitted disease (STD) testing. Diabetes screening. This is done by checking your blood sugar (glucose) after you have not eaten for a while (fasting). You may have this done every 1-3 years. Bone density scan. This is done to screen for osteoporosis. You may have this done starting at age 42. Mammogram. This may be done every 1-2 years. Talk to your health care provider about how often you should have regular mammograms. Talk with your health care provider about your test results, treatment options, and if necessary, the need for more tests. Vaccines  Your health care provider may recommend certain vaccines, such as: Influenza vaccine. This is recommended every year. Tetanus, diphtheria, and acellular pertussis (Tdap, Td) vaccine. You may need a Td booster every 10 years. Zoster vaccine. You may need this after age 91. Pneumococcal 13-valent conjugate (PCV13) vaccine. One dose is recommended after age 67. Pneumococcal polysaccharide (PPSV23) vaccine. One dose is recommended after age 6. Talk to your health care provider about which screenings and vaccines you need and how often you need them. This information is not intended to replace advice given to you by your health care provider. Make sure you discuss any questions you have with your health care provider. Document Released: 12/11/2015 Document Revised: 08/03/2016 Document Reviewed: 09/15/2015 Elsevier Interactive Patient Education  2017 [place] Prevention in the Home Falls can cause injuries. They can happen to people of all ages. There are many things you can do to make your home safe and to help prevent falls. What can I do on the outside of my home? Regularly fix the edges of walkways and driveways and fix any  cracks. Remove anything that might make you trip as you walk through a door, such as a raised step or threshold. Trim any bushes or trees on the path to your home. Use bright outdoor lighting. Clear any walking paths of anything that might make someone trip, such as rocks or tools. Regularly check to see if handrails are loose or broken. Make sure that both sides of any steps have handrails. Any raised decks and porches should have guardrails on the edges. Have any leaves, snow, or ice cleared regularly. Use sand or salt on walking paths during winter. Clean up any spills in your garage right away. This includes oil or grease spills. What can  I do in the bathroom? Use night lights. Install grab bars by the toilet and in the tub and shower. Do not use towel bars as grab bars. Use non-skid mats or decals in the tub or shower. If you need to sit down in the shower, use a plastic, non-slip stool. Keep the floor dry. Clean up any water that spills on the floor as soon as it happens. Remove soap buildup in the tub or shower regularly. Attach bath mats securely with double-sided non-slip rug tape. Do not have throw rugs and other things on the floor that can make you trip. What can I do in the bedroom? Use night lights. Make sure that you have a light by your bed that is easy to reach. Do not use any sheets or blankets that are too big for your bed. They should not hang down onto the floor. Have a firm chair that has side arms. You can use this for support while you get dressed. Do not have throw rugs and other things on the floor that can make you trip. What can I do in the kitchen? Clean up any spills right away. Avoid walking on wet floors. Keep items that you use a lot in easy-to-reach places. If you need to reach something above you, use a strong step stool that has a grab bar. Keep electrical cords out of the way. Do not use floor polish or wax that makes floors slippery. If you must  use wax, use non-skid floor wax. Do not have throw rugs and other things on the floor that can make you trip. What can I do with my stairs? Do not leave any items on the stairs. Make sure that there are handrails on both sides of the stairs and use them. Fix handrails that are broken or loose. Make sure that handrails are as long as the stairways. Check any carpeting to make sure that it is firmly attached to the stairs. Fix any carpet that is loose or worn. Avoid having throw rugs at the top or bottom of the stairs. If you do have throw rugs, attach them to the floor with carpet tape. Make sure that you have a light switch at the top of the stairs and the bottom of the stairs. If you do not have them, ask someone to add them for you. What else can I do to help prevent falls? Wear shoes that: Do not have high heels. Have rubber bottoms. Are comfortable and fit you well. Are closed at the toe. Do not wear sandals. If you use a stepladder: Make sure that it is fully opened. Do not climb a closed stepladder. Make sure that both sides of the stepladder are locked into place. Ask someone to hold it for you, if possible. Clearly mark and make sure that you can see: Any grab bars or handrails. First and last steps. Where the edge of each step is. Use tools that help you move around (mobility aids) if they are needed. These include: Canes. Walkers. Scooters. Crutches. Turn on the lights when you go into a dark area. Replace any light bulbs as soon as they burn out. Set up your furniture so you have a clear path. Avoid moving your furniture around. If any of your floors are uneven, fix them. If there are any pets around you, be aware of where they are. Review your medicines with your doctor. Some medicines can make you feel dizzy. This can increase your chance of falling. Ask  your doctor what other things that you can do to help prevent falls. This information is not intended to replace  advice given to you by your health care provider. Make sure you discuss any questions you have with your health care provider. Document Released: 09/10/2009 Document Revised: 04/21/2016 Document Reviewed: 12/19/2014 Elsevier Interactive Patient Education  2017 [employer].

## 2022-12-21 NOTE — Progress Notes (Signed)
Subjective:   Allison Franco is a 82 y.o. female who presents for Medicare Annual (Subsequent) preventive examination.  Review of Systems     Cardiac Risk Factors include: advanced age (>62mn, >>37women);diabetes mellitus;hypertension;obesity (BMI >30kg/m2)     Objective:    Today's Vitals   12/21/22 0921  BP: 122/66  Pulse: 86  Temp: 98.3 F (36.8 C)  TempSrc: Oral  SpO2: 98%  Weight: 182 lb (82.6 kg)  Height: '5\' 4"'$  (1.626 m)   Body mass index is 31.24 kg/m.     12/21/2022    9:30 AM 06/07/2022    1:21 AM 12/09/2021   10:39 AM 02/24/2021   10:21 AM 04/05/2020    5:59 AM 01/15/2020   11:45 AM 10/13/2019   11:24 AM  Advanced Directives  Does Patient Have a Medical Advance Directive? Yes No Yes Yes Yes Yes Yes  Type of Advance Directive Out of facility DNR (pink MOST or yellow form)  Out of facility DNR (pink MOST or yellow form) Out of facility DNR (pink MOST or yellow form)  [place] Does patient want to make changes to medical advance directive?     No - Patient declined    Copy of HH[place]in Chart?      Yes - validated most recent copy scanned in chart (See row information)     Current Medications (verified) Outpatient Encounter Medications as of 12/21/2022  Medication Sig   acetaminophen (TYLENOL) 500 MG tablet Take 1 tablet (500 mg total) by mouth every 6 (six) hours as needed for mild pain, moderate pain or headache. Reported on 05/10/2016   Alcohol Swabs (ALCOHOL WIPES) 70 % PADS by Does not apply route. Use with blood sugar check and injection of insulin   blood glucose meter kit and supplies KIT Dispense based on patient and insurance preference. Use up to four times daily as directed. (FOR ICD-9 250.00, 250.01).   Cholecalciferol (VITAMIN D3) 25 MCG (1000 UT) CAPS Take 1 capsule (1,000 Units total) by mouth daily.   dorzolamide-timolol (COSOPT) 2-0.5 % ophthalmic solution Instill 1 drop  into both eyes twice a day   glucose blood (ACCU-CHEK GUIDE) test strip TEST 3 TIMES A DAY   MELATONIN PO Take by mouth.   metFORMIN (GLUCOPHAGE) 500 MG tablet Take 1 tablet (500 mg total) by mouth daily with breakfast.   Multiple Vitamins-Minerals (CENTRUM SILVER 50+WOMEN) TABS Take 1 tablet by mouth daily.   olmesartan (BENICAR) 20 MG tablet Take 1 tablet (20 mg total) by mouth in the morning and at bedtime.   cloNIDine (CATAPRES) 0.1 MG tablet Take 1 tablet by mouth daily if systolic blood pressure over 160. If remains elevated call to office (Patient not taking: Reported on 12/21/2022)   diclofenac Sodium (VOLTAREN) 1 % GEL Apply 2 g topically 4 (four) times daily. (Patient not taking: Reported on 12/21/2022)   prednisoLONE acetate (PRED FORTE) 1 % ophthalmic suspension Place 1 drop into the left eye 2 (two) times daily for 5 days (Patient not taking: Reported on 12/21/2022)   traZODone (DESYREL) 50 MG tablet Take 1 tablet (50 mg total) by mouth at bedtime. (Patient not taking: Reported on 12/21/2022)   No facility-administered encounter medications on file as of 12/21/2022.    Allergies (verified) Aspirin, Hydroxyzine, and Valium [diazepam]   History: Past Medical History:  Diagnosis Date   Anxiety    Constipation    Depression    "following  husband's death"   Diabetes mellitus without complication ([place])    Hypertension    KNEE PAIN, BILATERAL 02/13/2009   Qualifier: Diagnosis of  By: Hassell Done FNP, Tori Milks     Tinnitus    Past Surgical History:  Procedure Laterality Date   ABDOMINAL HYSTERECTOMY     COLON RESECTION N/A 08/07/2019   Procedure: DIAGNOSTIC LAPAROSCOPY WITH OPEN LEFT INGUINAL HERNIA WITH MESH;  Surgeon: Johnathan Hausen, MD;  Location: [facility];  Service: General;  Laterality: N/A;   fribroid surgery     Family History  Problem Relation Age of Onset   Diabetes Mother    Hypertension Father    Breast cancer Neg Hx    Social History   Socioeconomic History   Marital  status: Widowed    Spouse name: Not on file   Number of children: 0   Years of education: 66   Highest education level: Not on file  Occupational History   Occupation: employed  Tobacco Use   Smoking status: Never   Smokeless tobacco: Never  Vaping Use   Vaping Use: Never used  Substance and Sexual Activity   Alcohol use: No   Drug use: No   Sexual activity: Not on file  Other Topics Concern   Not on file  Social History Narrative   Lives alone, husband passed away 2 1/2 years ago   Caffeine use- none   Social Determinants of Health   Financial Resource Strain: Low Risk  (12/21/2022)   Overall Financial Resource Strain (CARDIA)    Difficulty of Paying Living Expenses: Not hard at all  Food Insecurity: No Food Insecurity (12/21/2022)   Hunger Vital Sign    Worried About Running Out of Food in the Last Year: Never true    [place] in the Last Year: Never true  Transportation Needs: No Transportation Needs (12/21/2022)   PRAPARE - Hydrologist (Medical): No    Lack of Transportation (Non-Medical): No  Physical Activity: Inactive (12/21/2022)   Exercise Vital Sign    Days of Exercise per Week: 0 days    Minutes of Exercise per Session: 0 min  Stress: No Stress Concern Present (12/21/2022)   [place]    Feeling of Stress : Not at all  Social Connections: Not on file    Tobacco Counseling Counseling given: Not Answered   Clinical Intake:  Pre-visit preparation completed: Yes  Pain : No/denies pain     Nutritional Status: BMI > 30  Obese Nutritional Risks: None Diabetes: Yes  How often do you need to have someone help you when you read instructions, pamphlets, or other written materials from your doctor or pharmacy?: 1 - Never  Diabetic? Yes Nutrition Risk Assessment:  Has the patient had any N/V/D within the last 2 months?  No  Does the patient have any  non-healing wounds?  No  Has the patient had any unintentional weight loss or weight gain?  No   Diabetes:  Is the patient diabetic?  Yes  If diabetic, was a CBG obtained today?  No  Did the patient bring in their glucometer from home?  No  How often do you monitor your CBG's?  Every two weeks.   Financial Strains and Diabetes Management:  Are you having any financial strains with the device, your supplies or your medication? No .  Does the patient want to be seen by Chronic Care Management for management of  their diabetes?  No  Would the patient like to be referred to a Nutritionist or for Diabetic Management?  No   Diabetic Exams:  Diabetic Eye Exam: Completed 08/30/2022 Diabetic Foot Exam: Completed 08/31/2022   Interpreter Needed?: No  Information entered by :: NAllen LPN   Activities of Daily Living    12/21/2022    9:31 AM  In your present state of health, do you have any difficulty performing the following activities:  Hearing? 0  Vision? 0  Difficulty concentrating or making decisions? 0  Walking or climbing stairs? 0  Dressing or bathing? 0  Doing errands, shopping? 0  Preparing Food and eating ? N  Using the Toilet? N  In the past six months, have you accidently leaked urine? N  Do you have problems with loss of bowel control? N  Managing your Medications? N  Managing your Finances? N  Housekeeping or managing your Housekeeping? N    Patient Care Team: [name], FNP as PCP - General (General Practice) Mayford Knife, [facility] (Pharmacist)  Indicate any recent Medical Services you may have received from other than [facility] providers in the past year (date may be approximate).     Assessment:   This is a routine wellness examination for [place].  Hearing/Vision screen Vision Screening - Comments:: Regular eye exams, [facility]  Dietary issues and exercise activities discussed: Current Exercise Habits: The patient does not participate in regular  exercise at present   Goals Addressed             This Visit's Progress    Patient Stated       12/21/2022, no goals       Depression Screen    12/21/2022    9:31 AM 12/09/2021   10:41 AM 02/24/2021   10:24 AM 01/20/2021   11:44 AM 01/15/2020   11:47 AM 05/09/2019   11:24 AM 04/11/2019   12:09 PM  PHQ 2/9 Scores  PHQ - 2 Score 0 0 0 0 0 0 0  PHQ- 9 Score     3      Fall Risk    12/21/2022    9:31 AM 08/31/2022    9:39 AM 12/09/2021   10:41 AM 02/24/2021   10:23 AM 01/20/2021   11:44 AM  Fall Risk   Falls in the past year? 0 0 0 0 0  Number falls in past yr: 0 0     Injury with Fall? 0 0     Risk for fall due to : Medication side effect No Fall Risks Medication side effect Medication side effect   Follow up Falls prevention discussed;Education provided;Falls evaluation completed Falls evaluation completed Falls evaluation completed;Education provided;Falls prevention discussed Falls evaluation completed;Education provided;Falls prevention discussed     FALL RISK PREVENTION PERTAINING TO THE HOME:  Any stairs in or around the home? No  If so, are there any without handrails? N/a Home free of loose throw rugs in walkways, pet beds, electrical cords, etc? Yes  Adequate lighting in your home to reduce risk of falls? Yes   ASSISTIVE DEVICES UTILIZED TO PREVENT FALLS:  Life alert? No  Use of a cane, walker or w/c? No  Grab bars in the bathroom? Yes  Shower chair or bench in shower? No  Elevated toilet seat or a handicapped toilet? Yes   TIMED UP AND GO:  Was the test performed? Yes .  Length of time to ambulate 10 feet: 7 sec.   Gait slow  and steady without use of assistive device  Cognitive Function:        12/21/2022    9:32 AM 12/09/2021   10:43 AM 02/24/2021   10:25 AM 01/15/2020   11:49 AM 10/04/2018    3:03 PM  6CIT Screen  What Year? 0 points 0 points 0 points 0 points 0 points  What month? 0 points 0 points 0 points 0 points 0 points  What time? 0 points  0 points 0 points 0 points 0 points  Count back from 20 0 points 0 points 0 points 0 points 0 points  Months in reverse 0 points 2 points 0 points 2 points 0 points  Repeat phrase 6 points 2 points 10 points 6 points 0 points  Total Score 6 points 4 points 10 points 8 points 0 points    Immunizations Immunization History  Administered Date(s) Administered   Fluad Quad(high Dose 65+) 11/11/2020, 08/23/2021, 08/31/2022   Influenza Whole 11/18/2009   Influenza-Unspecified 09/24/2018   PFIZER(Purple Top)SARS-COV-2 Vaccination 02/14/2020, 03/10/2020, 10/28/2020   Pfizer Covid-19 Vaccine Bivalent Booster 33yr & up 01/27/2022   Pneumococcal Conjugate-13 01/16/2020   Pneumococcal Polysaccharide-23 02/01/2007   Td 02/01/2007   Tdap 01/09/2020   Zoster Recombinat (Shingrix) 08/25/2021, 11/03/2021    TDAP status: Up to date  Flu Vaccine status: Up to date  Pneumococcal vaccine status: Up to date  Covid-19 vaccine status: Completed vaccines  Qualifies for Shingles Vaccine? Yes   Zostavax completed Yes   Shingrix Completed?: Yes  Screening Tests Health Maintenance  Topic Date Due   COVID-19 Vaccine (5 - 2023-24 season) 07/29/2022   HEMOGLOBIN A1C  11/02/2022   Diabetic kidney evaluation - Urine ACR  01/24/2023   Diabetic kidney evaluation - eGFR measurement  07/10/2023   OPHTHALMOLOGY EXAM  08/31/2023   FOOT EXAM  09/01/2023   Medicare Annual Wellness (AWV)  12/22/2023   DTaP/Tdap/Td (3 - Td or Tdap) 01/08/2030   Pneumonia Vaccine 82 Years old  Completed   INFLUENZA VACCINE  Completed   DEXA SCAN  Completed   Zoster Vaccines- Shingrix  Completed   HPV VACCINES  Aged Out    Health Maintenance  Health Maintenance Due  Topic Date Due   COVID-19 Vaccine (5 - 2023-24 season) 07/29/2022   HEMOGLOBIN A1C  11/02/2022    Colorectal cancer screening: No longer required.   Mammogram status: scheduled for 02/09/2023  Bone Density status: Completed 12/28/2021.   Lung Cancer  Screening: (Low Dose CT Chest recommended if Age 82-80years, 30 pack-year currently smoking OR have quit w/in 15years.) does not qualify.   Lung Cancer Screening Referral: no  Additional Screening:  Hepatitis C Screening: does not qualify;   Vision Screening: Recommended annual ophthalmology exams for early detection of glaucoma and other disorders of the eye. Is the patient up to date with their annual eye exam?  Yes  Who is the provider or what is the name of the office in which the patient attends annual eye exams? [facility] If pt is not established with a provider, would they like to be referred to a provider to establish care? No .   Dental Screening: Recommended annual dental exams for proper oral hygiene  Community Resource Referral / Chronic Care Management: CRR required this visit?  No   CCM required this visit?  No      Plan:     I have personally reviewed and noted the following in the patient's chart:   Medical and social history  Use of alcohol, tobacco or illicit drugs  Current medications and supplements including opioid prescriptions. Patient is not currently taking opioid prescriptions. Functional ability and status Nutritional status Physical activity Advanced directives List of other physicians Hospitalizations, surgeries, and ER visits in previous 12 months Vitals Screenings to include cognitive, depression, and falls Referrals and appointments  In addition, I have reviewed and discussed with patient certain preventive protocols, quality metrics, and best practice recommendations. A written personalized care plan for preventive services as well as general preventive health recommendations were provided to patient.     Kellie Simmering, LPN   [id number]   Nurse Notes: none

## 2023-01-09 ENCOUNTER — Other Ambulatory Visit: Payer: Self-pay | Admitting: Nurse Practitioner

## 2023-01-09 ENCOUNTER — Other Ambulatory Visit (HOSPITAL_COMMUNITY): Payer: Self-pay

## 2023-01-09 DIAGNOSIS — E119 Type 2 diabetes mellitus without complications: Secondary | ICD-10-CM

## 2023-01-09 MED ORDER — METFORMIN HCL 500 MG PO TABS
500.0000 mg | ORAL_TABLET | Freq: Every day | ORAL | 1 refills | Status: DC
  Filled 2023-01-09: qty 90, 90d supply, fill #0
  Filled 2023-03-07 – 2023-03-29 (×2): qty 90, 90d supply, fill #1

## 2023-01-10 ENCOUNTER — Telehealth: Payer: Self-pay

## 2023-01-10 ENCOUNTER — Other Ambulatory Visit (HOSPITAL_COMMUNITY): Payer: Self-pay

## 2023-01-10 ENCOUNTER — Other Ambulatory Visit: Payer: Self-pay

## 2023-01-10 NOTE — Progress Notes (Signed)
Care Management & Coordination Services Pharmacy Team  Reason for Encounter: General adherence update   Contacted patient for general health update and medication adherence call.  Unsuccessful outreach. Left voicemail for patient to return call.    Chart Updates: Recent office visits:  12-21-2022 Kellie Simmering, LPN. Medicare wellness visit.  08-31-2022 [name], [place]. Flu vaccine given.  Recent consult visits:  None  Hospital visits:  Medication Reconciliation was completed by comparing discharge summary, patient's EMR and Pharmacy list, and upon discussion with patient.  Admitted to the hospital on 12-05-2021 due to sinus headache. Discharge date was 12-05-2022. Discharged from [facility] health ED at [place]?Medications Started at [facility] Discharge:?? Augmentin 875-125 mg every 12 hours  Medication Changes at Hospital Discharge: None  Medications Discontinued at Hospital Discharge: None  Medications that remain the same after Hospital Discharge:??  -All other medications will remain the same.    Hospital visits:  Medication Reconciliation was completed by comparing discharge summary, patient's EMR and Pharmacy list, and upon discussion with patient.  Admitted to the hospital on 09-23-2022 due to cough. Discharge date was 09-23-2022. [facility]  New?Medications Started at [facility] Discharge:?? Tessalon 100 mg every 8 hours  Medication Changes at Hospital Discharge: None  Medications Discontinued at Hospital Discharge: None  Medications that remain the same after Hospital Discharge:??  -All other medications will remain the same.   Medications: Outpatient Encounter Medications as of 01/10/2023  Medication Sig   acetaminophen (TYLENOL) 500 MG tablet Take 1 tablet (500 mg total) by mouth every 6 (six) hours as needed for mild pain, moderate pain or headache. Reported on 05/10/2016   Alcohol Swabs (ALCOHOL WIPES) 70 % PADS by Does  not apply route. Use with blood sugar check and injection of insulin   blood glucose meter kit and supplies KIT Dispense based on patient and insurance preference. Use up to four times daily as directed. (FOR ICD-9 250.00, 250.01).   Cholecalciferol (VITAMIN D3) 25 MCG (1000 UT) CAPS Take 1 capsule (1,000 Units total) by mouth daily.   cloNIDine (CATAPRES) 0.1 MG tablet Take 1 tablet by mouth daily if systolic blood pressure over 160. If remains elevated call to office (Patient not taking: Reported on 12/21/2022)   diclofenac Sodium (VOLTAREN) 1 % GEL Apply 2 g topically 4 (four) times daily. (Patient not taking: Reported on 12/21/2022)   dorzolamide-timolol (COSOPT) 2-0.5 % ophthalmic solution Instill 1 drop into both eyes twice a day   glucose blood (ACCU-CHEK GUIDE) test strip TEST 3 TIMES A DAY   MELATONIN PO Take by mouth.   metFORMIN (GLUCOPHAGE) 500 MG tablet Take 1 tablet (500 mg total) by mouth daily with breakfast.   Multiple Vitamins-Minerals (CENTRUM SILVER 50+WOMEN) TABS Take 1 tablet by mouth daily.   olmesartan (BENICAR) 20 MG tablet Take 1 tablet (20 mg total) by mouth in the morning and at bedtime.   prednisoLONE acetate (PRED FORTE) 1 % ophthalmic suspension Place 1 drop into the left eye 2 (two) times daily for 5 days (Patient not taking: Reported on 12/21/2022)   traZODone (DESYREL) 50 MG tablet Take 1 tablet (50 mg total) by mouth at bedtime. (Patient not taking: Reported on 12/21/2022)   No facility-administered encounter medications on file as of 01/10/2023.    Recent vitals BP Readings from Last 3 Encounters:  12/21/22 122/66  12/05/22 (!) 174/86  09/23/22 120/67   Pulse Readings from Last 3 Encounters:  12/21/22 86  12/05/22 94  09/23/22 69   Wt Readings from Last 3 Encounters:  12/21/22 182 lb (82.6 kg)  08/31/22 177 lb 3.2 oz (80.4 kg)  06/29/22 174 lb 6.4 oz (79.1 kg)   BMI Readings from Last 3 Encounters:  12/21/22 31.24 kg/m  08/31/22 31.39 kg/m   06/29/22 30.89 kg/m    Recent lab results    Component Value Date/Time   NA 130 (L) 07/09/2022 1943   NA 140 05/03/2022 1014   K 4.2 07/09/2022 1943   CL 99 07/09/2022 1943   CO2 24 07/09/2022 1943   GLUCOSE 104 (H) 07/09/2022 1943   BUN 20 07/09/2022 1943   BUN 14 05/03/2022 1014   CREATININE 0.78 07/09/2022 1943   CALCIUM 8.5 (L) 07/09/2022 1943    Lab Results  Component Value Date   CREATININE 0.78 07/09/2022   EGFR 72 05/03/2022   GFRNONAA >60 07/09/2022   GFRAA 78 11/11/2020   Lab Results  Component Value Date/Time   HGBA1C 5.7 (H) 05/03/2022 10:14 AM   HGBA1C 5.7 (H) 12/08/2021 10:24 AM   MICROALBUR 10 01/20/2021 12:36 PM    Lab Results  Component Value Date   CHOL 160 05/03/2022   HDL 40 05/03/2022   LDLCALC 92 05/03/2022   TRIG 161 (H) 05/03/2022   CHOLHDL 4.0 05/03/2022   01-10-2023: 1st attempt left VM 01-12-2023: 2nd attempt left VM 01-13-2023: 3rd attempt left VM  Care Gaps: Annual wellness visit in last year? Yes Covid booster overdue A1C overdue  If Diabetic: Last eye exam / retinopathy screening: 08-30-2022 Last diabetic foot exam: Last UACR: 01-24-2022  Star Rating Drugs:  Metformin 500 mg- Last filled 10-11-2022 90 [facility] [place]. Previous 07-12-2022 90 [facility] [place] Olmesartan 40 mg- Last filled 07-04-2022 90 [facility] [place]. Previous 20 mg 06-17-2022  [place] Clinical Pharmacist Assistant [phone]

## 2023-01-20 ENCOUNTER — Ambulatory Visit: Payer: Medicare Other

## 2023-01-25 ENCOUNTER — Other Ambulatory Visit (HOSPITAL_COMMUNITY): Payer: Self-pay

## 2023-01-25 DIAGNOSIS — H0102B Squamous blepharitis left eye, upper and lower eyelids: Secondary | ICD-10-CM | POA: Diagnosis not present

## 2023-01-25 DIAGNOSIS — H04123 Dry eye syndrome of bilateral lacrimal glands: Secondary | ICD-10-CM | POA: Diagnosis not present

## 2023-01-25 DIAGNOSIS — Z9889 Other specified postprocedural states: Secondary | ICD-10-CM | POA: Diagnosis not present

## 2023-01-25 DIAGNOSIS — H0102A Squamous blepharitis right eye, upper and lower eyelids: Secondary | ICD-10-CM | POA: Diagnosis not present

## 2023-01-25 DIAGNOSIS — H353122 Nonexudative age-related macular degeneration, left eye, intermediate dry stage: Secondary | ICD-10-CM | POA: Diagnosis not present

## 2023-01-25 MED ORDER — DORZOLAMIDE HCL-TIMOLOL MAL 2-0.5 % OP SOLN
1.0000 [drp] | Freq: Two times a day (BID) | OPHTHALMIC | 3 refills | Status: AC
  Filled 2023-01-25: qty 10, 50d supply, fill #0
  Filled 2023-01-25: qty 10, 30d supply, fill #0

## 2023-01-27 ENCOUNTER — Other Ambulatory Visit (HOSPITAL_COMMUNITY): Payer: Self-pay

## 2023-02-02 ENCOUNTER — Telehealth: Payer: Self-pay

## 2023-02-02 NOTE — Telephone Encounter (Signed)
Per pharmacy Olmesartan requires PA.  PA started via [facility] Key: [id number]

## 2023-02-07 NOTE — Telephone Encounter (Signed)
Request Reference Number: [id number]. OLMESA MEDOX TAB '20MG'$  is approved through 11/28/2023.

## 2023-02-09 ENCOUNTER — Ambulatory Visit
Admission: RE | Admit: 2023-02-09 | Discharge: 2023-02-09 | Disposition: A | Discharge status: Home / Self Care | Payer: 59 | Source: Ambulatory Visit | Attending: Nurse Practitioner | Admitting: Nurse Practitioner

## 2023-02-09 DIAGNOSIS — Z1231 Encounter for screening mammogram for malignant neoplasm of breast: Secondary | ICD-10-CM

## 2023-02-14 ENCOUNTER — Encounter: Payer: Medicare Other | Admitting: Internal Medicine

## 2023-02-14 DIAGNOSIS — K5900 Constipation, unspecified: Secondary | ICD-10-CM | POA: Insufficient documentation

## 2023-02-15 ENCOUNTER — Ambulatory Visit (INDEPENDENT_AMBULATORY_CARE_PROVIDER_SITE_OTHER): Payer: 59 | Admitting: Nurse Practitioner

## 2023-02-15 ENCOUNTER — Encounter: Payer: Self-pay | Admitting: Nurse Practitioner

## 2023-02-15 VITALS — BP 128/70 | HR 98 | Temp 98.2°F | Ht 64.0 in | Wt 182.0 lb

## 2023-02-15 DIAGNOSIS — E6609 Other obesity due to excess calories: Secondary | ICD-10-CM

## 2023-02-15 DIAGNOSIS — I119 Hypertensive heart disease without heart failure: Secondary | ICD-10-CM

## 2023-02-15 DIAGNOSIS — Z0000 Encounter for general adult medical examination without abnormal findings: Secondary | ICD-10-CM

## 2023-02-15 DIAGNOSIS — G8929 Other chronic pain: Secondary | ICD-10-CM | POA: Diagnosis not present

## 2023-02-15 DIAGNOSIS — M25561 Pain in right knee: Secondary | ICD-10-CM

## 2023-02-15 DIAGNOSIS — E1169 Type 2 diabetes mellitus with other specified complication: Secondary | ICD-10-CM | POA: Diagnosis not present

## 2023-02-15 DIAGNOSIS — I700 Atherosclerosis of aorta: Secondary | ICD-10-CM

## 2023-02-15 DIAGNOSIS — Z79899 Other long term (current) drug therapy: Secondary | ICD-10-CM | POA: Diagnosis not present

## 2023-02-15 DIAGNOSIS — M25562 Pain in left knee: Secondary | ICD-10-CM

## 2023-02-15 DIAGNOSIS — E119 Type 2 diabetes mellitus without complications: Secondary | ICD-10-CM

## 2023-02-15 DIAGNOSIS — F5101 Primary insomnia: Secondary | ICD-10-CM | POA: Diagnosis not present

## 2023-02-15 DIAGNOSIS — Z6831 Body mass index (BMI) 31.0-31.9, adult: Secondary | ICD-10-CM

## 2023-02-15 LAB — POCT URINALYSIS DIPSTICK
Bilirubin, UA: NEGATIVE
Glucose, UA: NEGATIVE
Ketones, UA: NEGATIVE
Leukocytes, UA: NEGATIVE
Nitrite, UA: NEGATIVE
Protein, UA: NEGATIVE
Spec Grav, UA: 1.025 (ref 1.010–1.025)
Urobilinogen, UA: 1 E.U./dL
pH, UA: 6 (ref 5.0–8.0)

## 2023-02-15 NOTE — Patient Instructions (Signed)

## 2023-02-15 NOTE — Progress Notes (Signed)
[name],acting as a Education administrator for [name], FNP.,have documented all relevant documentation on the behalf of [name], FNP,as directed by  [name], FNP while in the presence of [name], [place].   Subjective:     Patient ID: Allison Franco , female    DOB: 03-11-1941 , 82 y.o.   MRN: [id number]   Chief Complaint  Patient presents with   Annual Exam    HPI  Patient presents today for HM, patient states compliance with medications and has no other concerns today.  BP Readings from Last 3 Encounters: 02/15/23 : 128/70 12/21/22 : 122/66 12/05/22 : [name] 174/86       Past Medical History:  Diagnosis Date   Anxiety    Constipation    Depression    "following husband's death"   Diabetes mellitus without complication ([place])    Hypertension    KNEE PAIN, BILATERAL 02/13/2009   Qualifier: Diagnosis of  By: Hassell Done FNP, Tori Milks     Tinnitus      Family History  Problem Relation Age of Onset   Diabetes Mother    Hypertension Father    Breast cancer Neg Hx      Current Outpatient Medications:    Alcohol Swabs (ALCOHOL WIPES) 70 % PADS, by Does not apply route. Use with blood sugar check and injection of insulin, Disp: , Rfl:    blood glucose meter kit and supplies KIT, Dispense based on patient and insurance preference. Use up to four times daily as directed. (FOR ICD-9 250.00, 250.01)., Disp: 1 each, Rfl: 0   Cholecalciferol (VITAMIN D3) 25 MCG (1000 UT) CAPS, Take 1 capsule (1,000 Units total) by mouth daily., Disp: 60 capsule, Rfl: 1   dorzolamide-timolol (COSOPT) 2-0.5 % ophthalmic solution, Instill 1 drop into both eyes twice a day, Disp: 10 mL, Rfl: 3   dorzolamide-timolol (COSOPT) 2-0.5 % ophthalmic solution, Instill 1 drop into both eyes twice a day, Disp: 10 mL, Rfl: 3   glucose blood (ACCU-CHEK GUIDE) test strip, TEST 3 TIMES A DAY, Disp: 100 strip, Rfl: 3   MELATONIN PO, Take by mouth., Disp: , Rfl:    metFORMIN (GLUCOPHAGE) 500 MG tablet, Take 1  tablet (500 mg total) by mouth daily with breakfast., Disp: 90 tablet, Rfl: 1   Multiple Vitamins-Minerals (CENTRUM SILVER 50+WOMEN) TABS, Take 1 tablet by mouth daily., Disp: , Rfl:    olmesartan (BENICAR) 20 MG tablet, Take 1 tablet (20 mg total) by mouth in the morning and at bedtime., Disp: 60 tablet, Rfl: 3   acetaminophen (TYLENOL) 500 MG tablet, Take 1 tablet (500 mg total) by mouth every 4 (four) hours as needed., Disp: 30 tablet, Rfl: 0   lip balm (BLISTEX) OINT, Apply 1 Application topically as needed., Disp: 6.3 g, Rfl: 0   meloxicam (MOBIC) 7.5 MG tablet, Take 1 tablet (7.5 mg total) by mouth daily with meals, Disp: 30 tablet, Rfl: 1   Allergies  Allergen Reactions   Aspirin Other (See Comments)    insomnia   Hydroxyzine Other (See Comments)    insomnia   Valium [Diazepam] Other (See Comments)    Insomnia, 05/10/16 pt states she took as young person- made her depressed      The patient states she is status post hysterectomy for birth control.  No LMP recorded. Patient has had a hysterectomy.. Negative for Dysmenorrhea and Negative for Menorrhagia. Negative for: breast discharge, breast lump(s), breast pain and breast self exam. Associated symptoms include abnormal vaginal bleeding. Pertinent  negatives include abnormal bleeding (hematology), anxiety, decreased libido, depression, difficulty falling sleep, dyspareunia, history of infertility, nocturia, sexual dysfunction, sleep disturbances, urinary incontinence, urinary urgency, vaginal discharge and vaginal itching. Diet regular - she avoids eating sugar and soda. The patient states her exercise level is minimal - but she does a lot of walking.   The patient's tobacco use is:  Social History   Tobacco Use  Smoking Status Never  Smokeless Tobacco Never   She has been exposed to passive smoke. The patient's alcohol use is:  Social History   Substance and Sexual Activity  Alcohol Use No    Review of Systems  Constitutional:  Negative.   HENT: Negative.    Eyes: Negative.   Respiratory: Negative.    Cardiovascular: Negative.   Gastrointestinal: Negative.   Endocrine: Negative.   Genitourinary: Negative.   Musculoskeletal: Negative.   Skin: Negative.   Allergic/Immunologic: Negative.   Neurological: Negative.   Hematological: Negative.   Psychiatric/Behavioral: Negative.       Today's Vitals   02/15/23 1039  BP: 128/70  Pulse: 98  Temp: 98.2 F (36.8 C)  TempSrc: Oral  Weight: 182 lb (82.6 kg)  Height: 5\' 4"  (1.626 m)  PainSc: 6   PainLoc: Knee   Body mass index is 31.24 kg/m.  Wt Readings from Last 3 Encounters:  02/15/23 182 lb (82.6 kg)  12/21/22 182 lb (82.6 kg)  08/31/22 177 lb 3.2 oz (80.4 kg)    Objective:  Physical Exam Vitals reviewed.  Constitutional:      General: She is not in acute distress.    Appearance: Normal appearance. She is well-developed. She is obese.  HENT:     Head: Normocephalic and atraumatic.     Right Ear: Hearing, tympanic membrane, ear canal and external ear normal. There is no impacted cerumen.     Left Ear: Hearing, tympanic membrane, ear canal and external ear normal. There is no impacted cerumen.     Nose:     Comments: Deferred - masked    Mouth/Throat:     Comments: Deferred - masked Eyes:     General: Lids are normal.     Extraocular Movements: Extraocular movements intact.     Conjunctiva/sclera: Conjunctivae normal.     Pupils: Pupils are equal, round, and reactive to light.     Funduscopic exam:    Right eye: No papilledema.        Left eye: No papilledema.  Neck:     Thyroid: No thyroid mass.     Vascular: No carotid bruit.  Cardiovascular:     Rate and Rhythm: Normal rate and regular rhythm.     Pulses: Normal pulses.     Heart sounds: Normal heart sounds. No murmur heard. Pulmonary:     Effort: Pulmonary effort is normal. No respiratory distress.     Breath sounds: Normal breath sounds. No wheezing.  Chest:     Chest wall: No  mass.  Breasts:    Tanner Score is 5.     Right: Normal. No mass or tenderness.     Left: Normal. No mass or tenderness.  Abdominal:     General: Abdomen is flat. Bowel sounds are normal. There is no distension.     Palpations: Abdomen is soft.     Tenderness: There is no abdominal tenderness.  Genitourinary:    Rectum: Guaiac result negative.  Musculoskeletal:        General: No swelling or tenderness. Normal range of motion.  Cervical back: Full passive range of motion without pain, normal range of motion and neck supple.     Right lower leg: No edema.     Left lower leg: No edema.  Lymphadenopathy:     Upper Body:     Right upper body: No supraclavicular, axillary or pectoral adenopathy.     Left upper body: No supraclavicular, axillary or pectoral adenopathy.  Skin:    General: Skin is warm and dry.     Capillary Refill: Capillary refill takes less than 2 seconds.  Neurological:     General: No focal deficit present.     Mental Status: She is alert and oriented to person, place, and time.     Cranial Nerves: No cranial nerve deficit.     Sensory: No sensory deficit.     Motor: No weakness.  Psychiatric:        Mood and Affect: Mood normal.        Behavior: Behavior normal.        Thought Content: Thought content normal.        Judgment: Judgment normal.         Assessment And Plan:     1. Annual physical exam Behavior modifications discussed and diet history reviewed.   Pt will continue to exercise regularly and modify diet with low GI, plant based foods and decrease intake of processed foods.  Recommend intake of daily multivitamin, Vitamin D, and calcium.  Recommend for preventive screenings, as well as recommend immunizations that include influenza, TDAP, and Shingles  2. Class 1 obesity due to excess calories with serious comorbidity and body mass index (BMI) of 31.0 to 31.9 in adult Goal to exercise 150 minutes per week with at least 2 days of strength  training Encouraged to park further when at the store, take stairs instead of elevators and to walk in place during commercials. Increase water intake to at least one gallon of water daily. - Amb Referral To Provider Referral Exercise Program (P.R.E.P)  3. Hypertensive heart disease without heart failure Comments: Blood pressure is well controlled, continue current medications. Sinus Bradycardia -First degree A-V block . Inferior ST-elevation -repolarization variant.  - EKG 12-Lead - CMP14+EGFR  4. Type 2 diabetes mellitus without complication, without long-term current use of insulin (HCC) Comments: HgbA1c is stable. Continue current medications. - EKG 12-Lead - Microalbumin / creatinine urine ratio - POCT urinalysis dipstick - Lipid panel - Hemoglobin A1c  5. Aortic atherosclerosis (HCC) Continue statin, tolerating well.  6. Primary insomnia This is stable, no current issues.  7. Chronic pain of both knees Comments: Continue pain cream as needed.  8. Other long term (current) drug therapy - CBC   Patient was given opportunity to ask questions. Patient verbalized understanding of the plan and was able to repeat key elements of the plan. All questions were answered to their satisfaction.   [name], FNP   I, [name], FNP, have reviewed all documentation for this visit. The documentation on 02/15/23 for the exam, diagnosis, procedures, and orders are all accurate and complete.   THE PATIENT IS ENCOURAGED TO PRACTICE SOCIAL DISTANCING DUE TO THE COVID-19 PANDEMIC.

## 2023-02-16 LAB — CMP14+EGFR
ALT: 25 IU/L (ref 0–32)
AST: 25 IU/L (ref 0–40)
Albumin/Globulin Ratio: 1.4 (ref 1.2–2.2)
Albumin: 4.1 g/dL (ref 3.7–4.7)
Alkaline Phosphatase: 101 IU/L (ref 44–121)
BUN/Creatinine Ratio: 27 (ref 12–28)
BUN: 20 mg/dL (ref 8–27)
Bilirubin Total: 0.5 mg/dL (ref 0.0–1.2)
CO2: 27 mmol/L (ref 20–29)
Calcium: 9.5 mg/dL (ref 8.7–10.3)
Chloride: 100 mmol/L (ref 96–106)
Creatinine, Ser: 0.74 mg/dL (ref 0.57–1.00)
Globulin, Total: 3 g/dL (ref 1.5–4.5)
Glucose: 80 mg/dL (ref 70–99)
Potassium: 5 mmol/L (ref 3.5–5.2)
Sodium: 137 mmol/L (ref 134–144)
Total Protein: 7.1 g/dL (ref 6.0–8.5)
eGFR: 81 mL/min/{1.73_m2} (ref >59)

## 2023-02-16 LAB — LIPID PANEL
Chol/HDL Ratio: 3.9 ratio (ref 0.0–4.4)
Cholesterol, Total: 171 mg/dL (ref 100–199)
HDL: 44 mg/dL (ref >39)
LDL Chol Calc (NIH): 101 mg/dL — ABNORMAL HIGH (ref 0–99)
Triglycerides: 148 mg/dL (ref 0–149)
VLDL Cholesterol Cal: 26 mg/dL (ref 5–40)

## 2023-02-16 LAB — CBC
Hematocrit: 42.2 % (ref 34.0–46.6)
Hemoglobin: 13.1 g/dL (ref 11.1–15.9)
MCH: 27.6 pg (ref 26.6–33.0)
MCHC: 31 g/dL — ABNORMAL LOW (ref 31.5–35.7)
MCV: 89 fL (ref 79–97)
Platelets: 248 10*3/uL (ref 150–450)
RBC: 4.75 x10E6/uL (ref 3.77–5.28)
RDW: 11.6 % — ABNORMAL LOW (ref 11.7–15.4)
WBC: 5.2 10*3/uL (ref 3.4–10.8)

## 2023-02-16 LAB — MICROALBUMIN / CREATININE URINE RATIO
Creatinine, Urine: 30.4 mg/dL
Microalb/Creat Ratio: <10 mg/g creat (ref 0–29)
Microalbumin, Urine: <3 ug/mL

## 2023-02-16 LAB — HEMOGLOBIN A1C
Est. average glucose Bld gHb Est-mCnc: 123 mg/dL
Hgb A1c MFr Bld: 5.9 % — ABNORMAL HIGH (ref 4.8–5.6)

## 2023-02-21 ENCOUNTER — Other Ambulatory Visit (HOSPITAL_COMMUNITY): Payer: Self-pay

## 2023-02-21 ENCOUNTER — Ambulatory Visit (HOSPITAL_COMMUNITY)
Admission: EM | Admit: 2023-02-21 | Discharge: 2023-02-21 | Disposition: A | Discharge status: Home / Self Care | Payer: 59 | Attending: Emergency Medicine | Admitting: Emergency Medicine

## 2023-02-21 ENCOUNTER — Encounter (HOSPITAL_COMMUNITY): Payer: Self-pay | Admitting: Emergency Medicine

## 2023-02-21 DIAGNOSIS — M25562 Pain in left knee: Secondary | ICD-10-CM | POA: Diagnosis not present

## 2023-02-21 DIAGNOSIS — G8929 Other chronic pain: Secondary | ICD-10-CM | POA: Diagnosis not present

## 2023-02-21 DIAGNOSIS — S00521A Blister (nonthermal) of lip, initial encounter: Secondary | ICD-10-CM

## 2023-02-21 DIAGNOSIS — M25561 Pain in right knee: Secondary | ICD-10-CM | POA: Diagnosis not present

## 2023-02-21 DIAGNOSIS — R519 Headache, unspecified: Secondary | ICD-10-CM

## 2023-02-21 MED ORDER — BLISTEX MEDICATED EX OINT
1.0000 | TOPICAL_OINTMENT | CUTANEOUS | 0 refills | Status: DC | PRN
  Filled 2023-02-21: qty 6.3, fill #0

## 2023-02-21 MED ORDER — ACETAMINOPHEN 500 MG PO TABS: 500.0000 mg | ORAL_TABLET | ORAL | 0 refills | Status: AC | PRN

## 2023-02-21 NOTE — Discharge Instructions (Addendum)
You can take 500 mg tylenol every 4-6 hours to control pain.  The ointment can be applied to the lip as often as needed. This should help with pain and help healing.  If at any point symptoms get worse, please go directly to the emergency department.   For further evaluation and management of your knee, pain you can see the orthopedic specialists. Call either of the clinics to set up an appointment.

## 2023-02-21 NOTE — ED Triage Notes (Signed)
Pt reports has blister on lower lip and headache for 3 days. Took tylenol this morning.  Also c/o bilateral knee pain and weakness that has been ongoing and seen PCP about.

## 2023-02-21 NOTE — ED Provider Notes (Signed)
[facility]    CSN: [id number] Arrival date & time: 02/21/23  [id number]      History   Chief Complaint Chief Complaint  Patient presents with   Headache   Mouth Lesions    HPI Allison Franco is a 82 y.o. female.  Here with a blister on her lower lip x 3 days She reports it started as a pimple, popped on its own.  Denies any trauma to the lip.  No lesions inside the mouth No fevers  Also mild headache that started at the same time.  At first was 6/10 pain but then she took Tylenol and it improved. No dizziness, vision changes, weakness. Denies head injury or trauma. No headache in clinic.   History of chronic knee pain, osteoarthritis She has taken tylenol for this and saw her PCP at annual visit 6 days ago. Has received steroid injections in the past that help. She does not want knee replacements.   Past Medical History:  Diagnosis Date   Anxiety    Constipation    Depression    "following husband's death"   Diabetes mellitus without complication ([place])    Hypertension    KNEE PAIN, BILATERAL 02/13/2009   Qualifier: Diagnosis of  By: Hassell Done FNP, Tori Milks     Tinnitus     Patient Active Problem List   Diagnosis Date Noted   Constipation 02/14/2023   Unilateral primary osteoarthritis, left knee 05/18/2022   Primary open angle glaucoma (POAG) of both eyes, severe stage 07/16/2020   Inguinal hernia with bowel obstruction 08/07/2019   Incarcerated left inguinal hernia 08/07/2019   Sensorineural hearing loss (SNHL) of right ear with restricted hearing of left ear 05/21/2019   Tinnitus of right ear 05/21/2019   Anemia 08/31/2018   Essential hypertension 07/03/2018   Type 2 diabetes mellitus without complication, without long-term current use of insulin ([place]) 07/02/2018   Obesity 10/20/2013   ALLERGIC RHINITIS 08/11/2009   INSOMNIA 07/17/2008    Past Surgical History:  Procedure Laterality Date   ABDOMINAL HYSTERECTOMY     COLON RESECTION N/A 08/07/2019    Procedure: DIAGNOSTIC LAPAROSCOPY WITH OPEN LEFT INGUINAL HERNIA WITH MESH;  Surgeon: Johnathan Hausen, MD;  Location: [facility];  Service: General;  Laterality: N/A;   fribroid surgery      OB History   No obstetric history on file.      Home Medications    Prior to Admission medications   Medication Sig Start Date End Date Taking? Authorizing Provider  lip balm (BLISTEX) OINT Apply 1 Application topically as needed. 02/21/23  Yes , Wells Guiles, PA-C  acetaminophen (TYLENOL) 500 MG tablet Take 1 tablet (500 mg total) by mouth every 4 (four) hours as needed. 02/21/23   , Wells Guiles, PA-C  Alcohol Swabs (ALCOHOL WIPES) 70 % PADS by Does not apply route. Use with blood sugar check and injection of insulin    [provider]  blood glucose meter kit and supplies KIT Dispense based on patient and insurance preference. Use up to four times daily as directed. (FOR ICD-9 250.00, 250.01). 07/05/18   Florencia Reasons, MD  Cholecalciferol (VITAMIN D3) 25 MCG (1000 UT) CAPS Take 1 capsule (1,000 Units total) by mouth daily. 06/02/21   [name], FNP  dorzolamide-timolol (COSOPT) 2-0.5 % ophthalmic solution Instill 1 drop into both eyes twice a day 10/31/22     dorzolamide-timolol (COSOPT) 2-0.5 % ophthalmic solution Instill 1 drop into both eyes twice a day 01/25/23     glucose blood (  ACCU-CHEK GUIDE) test strip TEST 3 TIMES A DAY 08/29/22   [name], FNP  MELATONIN PO Take by mouth.    [provider]  metFORMIN (GLUCOPHAGE) 500 MG tablet Take 1 tablet (500 mg total) by mouth daily with breakfast. 01/09/23   [name], FNP  Multiple Vitamins-Minerals (CENTRUM SILVER 50+WOMEN) TABS Take 1 tablet by mouth daily.    [provider]  olmesartan (BENICAR) 20 MG tablet Take 1 tablet (20 mg total) by mouth in the morning and at bedtime. 08/24/22   [name], FNP    Family History Family History  Problem Relation Age of Onset   Diabetes Mother    Hypertension Father    Breast  cancer Neg Hx     Social History Social History   Tobacco Use   Smoking status: Never   Smokeless tobacco: Never  Vaping Use   Vaping Use: Never used  Substance Use Topics   Alcohol use: No   Drug use: No     Allergies   Aspirin, Hydroxyzine, and Valium [diazepam]   Review of Systems Review of Systems As per HPI  Physical Exam Triage Vital Signs ED Triage Vitals  Enc Vitals Group     BP 02/21/23 1104 134/69     Pulse Rate 02/21/23 1104 63     Resp 02/21/23 1104 17     Temp 02/21/23 1104 97.7 F (36.5 C)     Temp Source 02/21/23 1104 Oral     SpO2 02/21/23 1104 97 %     Weight --      Height --      Head Circumference --      Peak Flow --      Pain Score 02/21/23 1102 8     Pain Loc --      Pain Edu? --      Excl. in [place]? --    No data found.  Updated Vital Signs BP 134/69 (BP Location: Left Arm)   Pulse 63   Temp 97.7 F (36.5 C) (Oral)   Resp 17   SpO2 97%   Physical Exam Vitals and nursing note reviewed.  Constitutional:      General: She is not in acute distress. HENT:     Nose: No rhinorrhea.     Mouth/Throat:     Lips: Lesions present.     Mouth: Mucous membranes are moist.     Pharynx: Oropharynx is clear.      Comments: Blister-like lesion on the lower lip. No surrounding erythema, swelling, drainage. Minorly tender. No lesions in the mouth on tongue/cheeks/pharynx.  Eyes:     Conjunctiva/sclera: Conjunctivae normal.  Cardiovascular:     Rate and Rhythm: Normal rate and regular rhythm.     Heart sounds: Normal heart sounds.  Pulmonary:     Effort: Pulmonary effort is normal.     Breath sounds: Normal breath sounds.  Musculoskeletal:        General: No swelling, tenderness or deformity. Normal range of motion.     Cervical back: Normal range of motion.     Right lower leg: No edema.     Left lower leg: No edema.  Skin:    General: Skin is warm and dry.  Neurological:     General: No focal deficit present.     Mental Status: She  is alert and oriented to person, place, and time.     Cranial Nerves: No cranial nerve deficit.     Motor: No weakness.  UC Treatments / Results  Labs (all labs ordered are listed, but only abnormal results are displayed) Labs Reviewed - No data to display  EKG   Radiology No results found.  Procedures Procedures (including critical care time)  Medications Ordered in UC Medications - No data to display  Initial Impression / Assessment and Plan / UC Course  I have reviewed the triage vital signs and the nursing notes.  Pertinent labs & imaging results that were available during my care of the patient were reviewed by me and considered in my medical decision making (see chart for details).  Lip blister Unknown etiology but question if she bit the lip. No sign of infection today. Try ointment as needed.  Headache Reassuring this has resolved. BP normotensive. Good neuro exam. No congestion or sinus tenderness. Recommend monitor for return of symptoms. Continue tylenol 650 mg q6 hours if needed.  Chronic knee pain Continue tylenol. Provided with ortho specialist for follow up if she is interested in further injections.   Return precautions discussed. Patient agrees to plan  Final Clinical Impressions(s) / UC Diagnoses   Final diagnoses:  Acute nonintractable headache, unspecified headache type  Blister of lip without infection, initial encounter  Chronic pain of both knees     Discharge Instructions      You can take 500 mg tylenol every 4-6 hours to control pain.  The ointment can be applied to the lip as often as needed. This should help with pain and help healing.  If at any point symptoms get worse, please go directly to the emergency department.   For further evaluation and management of your knee, pain you can see the orthopedic specialists. Call either of the clinics to set up an appointment.    ED Prescriptions     Medication Sig Dispense Auth.  Provider   lip balm (BLISTEX) OINT Apply 1 Application topically as needed. 6.3 g , , PA-C   acetaminophen (TYLENOL) 500 MG tablet Take 1 tablet (500 mg total) by mouth every 4 (four) hours as needed. 30 tablet , Wells Guiles, PA-C      PDMP not reviewed this encounter.   , Wells Guiles, PA-C 02/21/23 1330

## 2023-02-23 ENCOUNTER — Telehealth: Payer: Self-pay | Admitting: *Deleted

## 2023-02-23 ENCOUNTER — Other Ambulatory Visit (HOSPITAL_COMMUNITY): Payer: Self-pay

## 2023-02-23 DIAGNOSIS — I700 Atherosclerosis of aorta: Secondary | ICD-10-CM | POA: Insufficient documentation

## 2023-02-23 DIAGNOSIS — M25561 Pain in right knee: Secondary | ICD-10-CM | POA: Diagnosis not present

## 2023-02-23 DIAGNOSIS — M25562 Pain in left knee: Secondary | ICD-10-CM | POA: Diagnosis not present

## 2023-02-23 DIAGNOSIS — M25551 Pain in right hip: Secondary | ICD-10-CM | POA: Diagnosis not present

## 2023-02-23 DIAGNOSIS — I119 Hypertensive heart disease without heart failure: Secondary | ICD-10-CM | POA: Insufficient documentation

## 2023-02-23 MED ORDER — MELOXICAM 7.5 MG PO TABS
7.5000 mg | ORAL_TABLET | Freq: Every day | ORAL | 1 refills | Status: DC
  Filled 2023-02-23: qty 30, 30d supply, fill #0
  Filled 2023-04-04: qty 30, 30d supply, fill #1

## 2023-02-23 NOTE — Telephone Encounter (Signed)
Contacted regarding PREP Class referral. Left voice message to return call for more information. 

## 2023-02-27 ENCOUNTER — Other Ambulatory Visit (HOSPITAL_COMMUNITY): Payer: Self-pay

## 2023-03-01 ENCOUNTER — Other Ambulatory Visit: Payer: Self-pay

## 2023-03-01 ENCOUNTER — Emergency Department (HOSPITAL_COMMUNITY): Payer: 59

## 2023-03-01 ENCOUNTER — Encounter (HOSPITAL_COMMUNITY): Payer: Self-pay

## 2023-03-01 ENCOUNTER — Emergency Department (HOSPITAL_COMMUNITY)
Admission: EM | Admit: 2023-03-01 | Discharge: 2023-03-01 | Disposition: A | Discharge status: Home / Self Care | Payer: 59 | Attending: Emergency Medicine | Admitting: Emergency Medicine

## 2023-03-01 DIAGNOSIS — Z7984 Long term (current) use of oral hypoglycemic drugs: Secondary | ICD-10-CM | POA: Insufficient documentation

## 2023-03-01 DIAGNOSIS — J101 Influenza due to other identified influenza virus with other respiratory manifestations: Secondary | ICD-10-CM | POA: Insufficient documentation

## 2023-03-01 DIAGNOSIS — Z20822 Contact with and (suspected) exposure to covid-19: Secondary | ICD-10-CM | POA: Insufficient documentation

## 2023-03-01 DIAGNOSIS — R059 Cough, unspecified: Secondary | ICD-10-CM | POA: Diagnosis not present

## 2023-03-01 DIAGNOSIS — J029 Acute pharyngitis, unspecified: Secondary | ICD-10-CM | POA: Diagnosis not present

## 2023-03-01 DIAGNOSIS — J9811 Atelectasis: Secondary | ICD-10-CM | POA: Diagnosis not present

## 2023-03-01 DIAGNOSIS — E119 Type 2 diabetes mellitus without complications: Secondary | ICD-10-CM | POA: Insufficient documentation

## 2023-03-01 DIAGNOSIS — R079 Chest pain, unspecified: Secondary | ICD-10-CM | POA: Diagnosis not present

## 2023-03-01 LAB — RESP PANEL BY RT-PCR (RSV, FLU A&B, COVID)  RVPGX2
Influenza A by PCR: POSITIVE — AB
Influenza B by PCR: NEGATIVE
Resp Syncytial Virus by PCR: NEGATIVE
SARS Coronavirus 2 by RT PCR: NEGATIVE

## 2023-03-01 LAB — GROUP A STREP BY PCR: Group A Strep by PCR: NOT DETECTED

## 2023-03-01 NOTE — ED Provider Notes (Cosign Needed Addendum)
[facility] Provider Note   CSN: [id number] Arrival date & time: 03/01/23  1015     History  Chief Complaint  Patient presents with   Ear Pain   Cough   Sore Throat    Allison Franco is a 82 y.o. female history of diabetes presented with sore throat and cough the past 2 weeks.  Patient states she was seen at urgent care 2 weeks ago and told to be seen in the ED if symptoms persist for 2 weeks.  Patient states she is able to tolerate food and fluids orally and that every time she swallows her throat hurts.  Patient denied fevers, nausea/vomiting, ear pain (which is different from the triage note), left knee pain (which is different from the triage note).  Patient states she has had a dry cough for the past 2 weeks.  Patient denies productive cough, fevers/chills, nausea/vomiting, chest pain, shortness of breath, sick contacts, dysuria, abdominal pain, diarrhea, neck pain, altered mental status  Home Medications Prior to Admission medications   Medication Sig Start Date End Date Taking? Authorizing Provider  acetaminophen (TYLENOL) 500 MG tablet Take 1 tablet (500 mg total) by mouth every 4 (four) hours as needed. 02/21/23   Rising, Wells Guiles, PA-C  Alcohol Swabs (ALCOHOL WIPES) 70 % PADS by Does not apply route. Use with blood sugar check and injection of insulin    [provider]  blood glucose meter kit and supplies KIT Dispense based on patient and insurance preference. Use up to four times daily as directed. (FOR ICD-9 250.00, 250.01). 07/05/18   Florencia Reasons, MD  Cholecalciferol (VITAMIN D3) 25 MCG (1000 UT) CAPS Take 1 capsule (1,000 Units total) by mouth daily. 06/02/21   [name], FNP  dorzolamide-timolol (COSOPT) 2-0.5 % ophthalmic solution Instill 1 drop into both eyes twice a day 10/31/22     dorzolamide-timolol (COSOPT) 2-0.5 % ophthalmic solution Instill 1 drop into both eyes twice a day 01/25/23     glucose blood (ACCU-CHEK GUIDE)  test strip TEST 3 TIMES A DAY 08/29/22   [name], FNP  lip balm (BLISTEX) OINT Apply 1 Application topically as needed. 02/21/23   Rising, Wells Guiles, PA-C  MELATONIN PO Take by mouth.    [provider]  meloxicam (MOBIC) 7.5 MG tablet Take 1 tablet (7.5 mg total) by mouth daily with meals 02/23/23     metFORMIN (GLUCOPHAGE) 500 MG tablet Take 1 tablet (500 mg total) by mouth daily with breakfast. 01/09/23   [name], FNP  Multiple Vitamins-Minerals (CENTRUM SILVER 50+WOMEN) TABS Take 1 tablet by mouth daily.    [provider]  olmesartan (BENICAR) 20 MG tablet Take 1 tablet (20 mg total) by mouth in the morning and at bedtime. 08/24/22   [name], FNP      Allergies    Aspirin, Hydroxyzine, and Valium [diazepam]    Review of Systems   Review of Systems  Respiratory:  Positive for cough.   HPI  Physical Exam Updated Vital Signs BP 123/72 (BP Location: Left Arm)   Pulse 68   Temp (!) 97.5 F (36.4 C) (Oral)   Resp 18   SpO2 100%  Physical Exam Vitals reviewed.  Constitutional:      General: She is not in acute distress. HENT:     Head: Normocephalic and atraumatic.     Right Ear: Tympanic membrane and ear canal normal.     Left Ear: Tympanic membrane and ear canal normal.  Nose: Rhinorrhea present. No congestion.     Mouth/Throat:     Mouth: Mucous membranes are moist.     Pharynx: Posterior oropharyngeal erythema present.     Tonsils: No tonsillar exudate or tonsillar abscesses.  Eyes:     Extraocular Movements: Extraocular movements intact.     Conjunctiva/sclera: Conjunctivae normal.     Pupils: Pupils are equal, round, and reactive to light.  Cardiovascular:     Rate and Rhythm: Normal rate and regular rhythm.     Pulses: Normal pulses.     Heart sounds: Normal heart sounds.     Comments: 2+ bilateral radial/dorsalis pedis pulses with regular rate Pulmonary:     Effort: Pulmonary effort is normal. No respiratory distress.     Breath  sounds: Normal breath sounds.  Abdominal:     Palpations: Abdomen is soft.     Tenderness: There is no abdominal tenderness. There is no guarding or rebound.  Musculoskeletal:        General: Normal range of motion.     Cervical back: Normal range of motion and neck supple.     Comments: 5 out of 5 bilateral grip/leg extension strength  Lymphadenopathy:     Cervical: No cervical adenopathy.  Skin:    General: Skin is warm and dry.     Capillary Refill: Capillary refill takes less than 2 seconds.  Neurological:     General: No focal deficit present.     Mental Status: She is alert and oriented to person, place, and time.     Comments: Sensation intact in all 4 limbs  Psychiatric:        Mood and Affect: Mood normal.     ED Results / Procedures / Treatments   Labs (all labs ordered are listed, but only abnormal results are displayed) Labs Reviewed  RESP PANEL BY RT-PCR (RSV, FLU A&B, COVID)  [id number] - Abnormal; Notable for the following components:      Result Value   Influenza A by PCR POSITIVE (*)    All other components within normal limits  GROUP A STREP BY PCR    EKG None  Radiology DG Chest Port 1 View  Result Date: 03/01/2023 CLINICAL DATA:  Cough, body aches, ear pain, sore throat and runny nose for 2 weeks EXAM: PORTABLE CHEST 1 VIEW COMPARISON:  Portable exam 1201 hours compared to 06/07/2022 FINDINGS: Normal heart size, mediastinal contours, and pulmonary vascularity. Atherosclerotic calcification aorta. Minimal chronic LEFT basilar atelectasis. Lungs otherwise clear. No infiltrate, pleural effusion, or pneumothorax. IMPRESSION: Minimal chronic atelectasis at LEFT base. Aortic Atherosclerosis (ICD10-I70.0). Electronically Signed   By: Lavonia Dana M.D.   On: 03/01/2023 12:11    Procedures Procedures    Medications Ordered in ED Medications - No data to display  ED Course/ Medical Decision Making/ A&P                             Medical Decision Making Amount  and/or Complexity of Data Reviewed Radiology: ordered.   [name] 82 y.o. presented today for URI like symptoms. Working DDx that I considered at this time includes, but not limited to, URI, strep pharyngitis, peritonsillar abscess, retropharyngeal abscess, pneumonia, meningitis.  R/o DDx: Meningitis, RPA, PTA: These diagnoses are not consistent with patient's presentation/history/labs  Review of prior external notes: 02/21/2023 ED provider  Unique Tests and My Interpretation:  Respiratory panel: Influenza A positive Group A strep: Negative Chest x-ray: Chronic left  base atelectasis  Discussion with Independent Historian: None  Discussion of Management of Tests: None  Risk:    Medium:  - prescription drug management  Risk Stratification Score: Centor 0  Plan: Patient presented for URI-like symptoms.  On exam patient is stable vitals and did not appear in distress.  During encounter patient denied having ear pain or left knee pain which was documented in triage.  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  Patient's ear nose and throat exam was remarkable for erythema in the oropharynx however tympanic membranes appeared nonerythematous and nonbulging and patient denied any ear pain.  Patient symptoms appear to be similar to URI and so respiratory panel along with a group A strep was sent.  Patient states she has had a cough and so chest x-ray was obtained as symptoms been going on for 2 weeks to rule out any pneumonia.  Patient stable at this time.  Patient's respiratory panel came back positive for influenza A.  Patient's chest x-ray was unremarkable for any changes but does note chronic left base atelectasis.  Currently awaiting patient's strep results.  Due to patient's symptoms being present for 2 weeks patient is outside the window for Tamiflu at this time and we will pursue supportive  therapy.  Patient stable at this time.  Patient strep results were negative.  At this time patient has influenza A will be treated with supportive therapy.  I updated the patient of these results and talked about supportive treatment and following up with her primary care provider.  I spoke with patient while she is outside the window for Tamiflu as it has been 2 weeks.  Patient states she was unhappy with her care and I spoke with her extensively about how return for the flu is fluids and Tylenol every 6 hours which she is doing and I encouraged her to keep doing that.  Patient was given return precautions.patient stable for discharge at this time.  Patient verbalized understanding of plan.   Final Clinical Impression(s) / ED Diagnoses Final diagnoses:  Influenza A    Rx / DC Orders ED Discharge Orders     None         Elvina Sidle 03/01/23 1318    Pattricia Boss, MD 03/02/23 [phone]

## 2023-03-01 NOTE — ED Triage Notes (Signed)
Pt came in via POV d/t 2 weeks of body aches, ear pain, sore throat, coughing & runny nose. A/Ox4, rates generalized pain 8/10 while in triage. Also endorses Lt knee pain for the past year giving her issues today.

## 2023-03-01 NOTE — Discharge Instructions (Addendum)
You may take Tylenol every 6 hours as needed for pain.  Please take in plenty of fluids and monitor your symptoms and take in food as tolerated.  Please follow-up with your primary care provider in the next few days to be reevaluated.  If you begin to have difficulty breathing, shortness of breath, fevers are unrelenting with Tylenol please return to ER.

## 2023-03-03 ENCOUNTER — Ambulatory Visit (HOSPITAL_COMMUNITY)
Admission: EM | Admit: 2023-03-03 | Discharge: 2023-03-03 | Disposition: A | Discharge status: Home / Self Care | Payer: 59 | Attending: Emergency Medicine | Admitting: Emergency Medicine

## 2023-03-03 ENCOUNTER — Encounter (HOSPITAL_COMMUNITY): Payer: Self-pay

## 2023-03-03 ENCOUNTER — Other Ambulatory Visit (HOSPITAL_COMMUNITY): Payer: Self-pay

## 2023-03-03 DIAGNOSIS — R059 Cough, unspecified: Secondary | ICD-10-CM | POA: Diagnosis not present

## 2023-03-03 DIAGNOSIS — R062 Wheezing: Secondary | ICD-10-CM | POA: Diagnosis not present

## 2023-03-03 DIAGNOSIS — J4521 Mild intermittent asthma with (acute) exacerbation: Secondary | ICD-10-CM | POA: Diagnosis not present

## 2023-03-03 DIAGNOSIS — I10 Essential (primary) hypertension: Secondary | ICD-10-CM | POA: Diagnosis not present

## 2023-03-03 MED ORDER — ALBUTEROL SULFATE HFA 108 (90 BASE) MCG/ACT IN AERS
2.0000 | INHALATION_SPRAY | Freq: Once | RESPIRATORY_TRACT | Status: AC
  Administered 2023-03-03: 2 via RESPIRATORY_TRACT

## 2023-03-03 MED ORDER — PREDNISONE 20 MG PO TABS
40.0000 mg | ORAL_TABLET | Freq: Every day | ORAL | 0 refills | Status: AC
  Filled 2023-03-03: qty 10, 5d supply, fill #0

## 2023-03-03 MED ORDER — ALBUTEROL SULFATE HFA 108 (90 BASE) MCG/ACT IN AERS
INHALATION_SPRAY | RESPIRATORY_TRACT | Status: AC
  Filled 2023-03-03: qty 6.7

## 2023-03-03 NOTE — Discharge Instructions (Addendum)
Please use the inhaler 3 times daily (every 6 hours) for the next 4-5 days. You can then continue using it as needed.  Take the prednisone as prescribed.  If your symptoms worsen, please call 911 to bring you to the hospital.

## 2023-03-03 NOTE — ED Provider Notes (Signed)
[facility]    CSN: [id number] Arrival date & time: 03/03/23  1112     History   Chief Complaint Chief Complaint  Patient presents with   Cough   Shortness of Breath    HPI Allison Franco is a 82 y.o. female.  Last night developed shortness of breath, cough. She called 911, EMS gave her solumedrol and DuoNeb which resolved her symptoms. They recommended she be seen by PCP or UC today. She is no longer having shortness of breath. No fever.  Seen in the ED 2 days ago, dx with flu A At that visit she complained of 2 week history of cough and sore throat. However I saw this patient 3/26 and her only concern was lip blister and headache; unknown for sure how long symptoms had been going on.  She had a negative CXR 2 days ago  Hx DM2. 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   Constipation 02/14/2023   Unilateral primary osteoarthritis, left knee 05/18/2022   Primary open angle glaucoma (POAG) of both eyes, severe stage 07/16/2020   Inguinal hernia with bowel obstruction 08/07/2019   Incarcerated left inguinal hernia 08/07/2019   Sensorineural hearing loss (SNHL) of right ear with restricted hearing of left ear 05/21/2019   Tinnitus of right ear 05/21/2019   Anemia 08/31/2018   Essential hypertension 07/03/2018   Type 2 diabetes mellitus without complication, without long-term current use of insulin 07/02/2018   Obesity 10/20/2013   ALLERGIC RHINITIS 08/11/2009   Chronic pain of both knees 02/13/2009   INSOMNIA 07/17/2008    Past Surgical History:  Procedure  Laterality Date   ABDOMINAL HYSTERECTOMY     COLON RESECTION N/A 08/07/2019   Procedure: DIAGNOSTIC LAPAROSCOPY WITH OPEN LEFT INGUINAL HERNIA WITH MESH;  Surgeon: Luretha MurphyMartin, Matthew, MD;  Location: [facility];  Service: General;  Laterality: N/A;   fribroid surgery      OB History   No obstetric history on file.      Home Medications    Prior to Admission medications   Medication Sig Start Date End Date Taking? Authorizing Provider  predniSONE (DELTASONE) 20 MG tablet Take 2 tablets (40 mg total) by mouth daily with breakfast for 5 days. 03/03/23 03/08/23 Yes , Lurena Joinerebecca, PA-C  acetaminophen (TYLENOL) 500 MG tablet Take 1 tablet (500 mg total) by mouth every 4 (four) hours as needed. 02/21/23   , Lurena Joinerebecca, PA-C  Alcohol Swabs (ALCOHOL WIPES) 70 % PADS by Does not apply route. Use with blood sugar check and injection of insulin    [provider]  Cholecalciferol (VITAMIN D3) 25 MCG (1000 UT) CAPS Take 1 capsule (1,000 Units total) by mouth daily. 06/02/21   Arnette FeltsMoore, Janece, FNP  dorzolamide-timolol (COSOPT) 2-0.5 % ophthalmic solution Instill 1 drop into both eyes twice a day 01/25/23     glucose blood (ACCU-CHEK GUIDE) test strip TEST 3 TIMES A DAY 08/29/22   Arnette FeltsMoore, Janece, FNP  MELATONIN PO Take by mouth.    [provider]  meloxicam (MOBIC) 7.5  MG tablet Take 1 tablet (7.5 mg total) by mouth daily with meals 02/23/23     metFORMIN (GLUCOPHAGE) 500 MG tablet Take 1 tablet (500 mg total) by mouth daily with breakfast. 01/09/23   Arnette FeltsMoore, Janece, FNP  Multiple Vitamins-Minerals (CENTRUM SILVER 50+WOMEN) TABS Take 1 tablet by mouth daily.    [provider]  olmesartan (BENICAR) 20 MG tablet Take 1 tablet (20 mg total) by mouth in the morning and at bedtime. 08/24/22   Arnette FeltsMoore, Janece, FNP    Family History Family History  Problem Relation Age of Onset   Diabetes Mother    Hypertension Father    Breast cancer Neg Hx     Social History Social History   Tobacco Use    Smoking status: Never   Smokeless tobacco: Never  Vaping Use   Vaping Use: Never used  Substance Use Topics   Alcohol use: No   Drug use: No     Allergies   Aspirin, Hydroxyzine, and Valium [diazepam]   Review of Systems Review of Systems As per HPI  Physical Exam Triage Vital Signs ED Triage Vitals  Enc Vitals Group     BP      Pulse      Resp      Temp      Temp src      SpO2      Weight      Height      Head Circumference      Peak Flow      Pain Score      Pain Loc      Pain Edu?      Excl. in GC?    No data found.  Updated Vital Signs BP (!) 148/84 (BP Location: Left Arm)   Pulse 97   Temp 97.9 F (36.6 C) (Oral)   Resp 16   SpO2 94%    Physical Exam Vitals and nursing note reviewed.  Constitutional:      General: She is not in acute distress.    Appearance: Normal appearance. She is not ill-appearing or diaphoretic.  HENT:     Right Ear: Tympanic membrane and ear canal normal.     Left Ear: Tympanic membrane and ear canal normal.     Nose: No congestion or rhinorrhea.     Mouth/Throat:     Mouth: Mucous membranes are moist.     Pharynx: Oropharynx is clear. No posterior oropharyngeal erythema.  Eyes:     Conjunctiva/sclera: Conjunctivae normal.  Cardiovascular:     Rate and Rhythm: Normal rate and regular rhythm.     Pulses: Normal pulses.     Heart sounds: Normal heart sounds.  Pulmonary:     Effort: Pulmonary effort is normal. No respiratory distress.     Breath sounds: Normal breath sounds. No wheezing, rhonchi or rales.  Musculoskeletal:     Cervical back: Normal range of motion.  Lymphadenopathy:     Cervical: No cervical adenopathy.  Skin:    General: Skin is warm and dry.  Neurological:     Mental Status: She is alert and oriented to person, place, and time.      UC Treatments / Results  Labs (all labs ordered are listed, but only abnormal results are displayed) Labs Reviewed - No data to  display  EKG  Radiology No results found.  Procedures Procedures (including critical care time)  Medications Ordered in UC Medications  albuterol (VENTOLIN HFA) 108 (90 Base) MCG/ACT inhaler 2 puff (  2 puffs Inhalation Given 03/03/23 1218)    Initial Impression / Assessment and Plan / UC Course  I have reviewed the triage vital signs and the nursing notes.  Pertinent labs & imaging results that were available during my care of the patient were reviewed by me and considered in my medical decision making (see chart for details).  Afebrile, well appearing, lungs are clear. No cough or respiratory distress in clinic. Stable vitals and sating well on room air. Symptoms from last night have resolved. No indication to repeat xray imaging. 2 puffs inhaler given in clinic with instructions for use at home. Will use q6 hours for the next several days. Prednisone 40 mg daily x 5 days. She is advised to call her PCP and schedule an appointment for sometime next week. Strict ED precautions in the meantime.  Final Clinical Impressions(s) / UC Diagnoses   Final diagnoses:  Mild intermittent asthma with acute exacerbation     Discharge Instructions      Please use the inhaler 3 times daily (every 6 hours) for the next 4-5 days. You can then continue using it as needed.  Take the prednisone as prescribed.  If your symptoms worsen, please call 911 to bring you to the hospital.     ED Prescriptions     Medication Sig Dispense Auth. Provider   predniSONE (DELTASONE) 20 MG tablet Take 2 tablets (40 mg total) by mouth daily with breakfast for 5 days. 10 tablet , Lurena Joiner, PA-C      PDMP not reviewed this encounter.   Marlow Baars, [place] 03/03/23 1241

## 2023-03-03 NOTE — ED Triage Notes (Signed)
Patient c/o a productive cough with yellow sputum and SOB x 2 days. Patient states she called 911 yesterday and was told to ask her PCP if she had asthma or COPD. Sats yesterday were 98%. Today sats-94%.

## 2023-03-06 ENCOUNTER — Telehealth: Payer: Self-pay

## 2023-03-06 NOTE — Telephone Encounter (Signed)
        Patient  visited [facility] on 4/3     Telephone encounter attempt :  1st  A HIPAA compliant voice message was left requesting a return call.  Instructed patient to call back       [facility], [place] [phone] [address], [place], [place] [address] Phone: [phone] Email: [email or web address]       

## 2023-03-06 NOTE — Telephone Encounter (Signed)
        Patient  visited [facility] on 4/3    Telephone encounter attempt :  2nd   Patient declined call     [facility] Guide, [facility] [phone] [address] [place], [place], [place] [address] Phone: [phone] Email: [email or web address]

## 2023-03-07 ENCOUNTER — Other Ambulatory Visit (HOSPITAL_COMMUNITY): Payer: Self-pay

## 2023-03-14 DIAGNOSIS — M25562 Pain in left knee: Secondary | ICD-10-CM | POA: Diagnosis not present

## 2023-03-24 DIAGNOSIS — H903 Sensorineural hearing loss, bilateral: Secondary | ICD-10-CM | POA: Diagnosis not present

## 2023-03-24 DIAGNOSIS — H9311 Tinnitus, right ear: Secondary | ICD-10-CM | POA: Diagnosis not present

## 2023-04-04 ENCOUNTER — Other Ambulatory Visit (HOSPITAL_COMMUNITY): Payer: Self-pay

## 2023-05-02 ENCOUNTER — Other Ambulatory Visit: Payer: Self-pay | Admitting: Nurse Practitioner

## 2023-05-02 ENCOUNTER — Other Ambulatory Visit (HOSPITAL_COMMUNITY): Payer: Self-pay

## 2023-05-04 ENCOUNTER — Other Ambulatory Visit (HOSPITAL_COMMUNITY): Payer: Self-pay

## 2023-05-04 MED ORDER — MELOXICAM 7.5 MG PO TABS
7.5000 mg | ORAL_TABLET | Freq: Every day | ORAL | 1 refills | Status: DC
  Filled 2023-05-04: qty 30, 30d supply, fill #0
  Filled 2023-06-05: qty 30, 30d supply, fill #1

## 2023-05-05 ENCOUNTER — Other Ambulatory Visit (HOSPITAL_COMMUNITY): Payer: Self-pay

## 2023-05-05 DIAGNOSIS — H35373 Puckering of macula, bilateral: Secondary | ICD-10-CM | POA: Diagnosis not present

## 2023-05-05 DIAGNOSIS — H0102A Squamous blepharitis right eye, upper and lower eyelids: Secondary | ICD-10-CM | POA: Diagnosis not present

## 2023-05-05 DIAGNOSIS — E119 Type 2 diabetes mellitus without complications: Secondary | ICD-10-CM | POA: Diagnosis not present

## 2023-05-05 DIAGNOSIS — H401123 Primary open-angle glaucoma, left eye, severe stage: Secondary | ICD-10-CM | POA: Diagnosis not present

## 2023-05-05 DIAGNOSIS — H25813 Combined forms of age-related cataract, bilateral: Secondary | ICD-10-CM | POA: Diagnosis not present

## 2023-05-05 DIAGNOSIS — H04123 Dry eye syndrome of bilateral lacrimal glands: Secondary | ICD-10-CM | POA: Diagnosis not present

## 2023-05-05 DIAGNOSIS — H0102B Squamous blepharitis left eye, upper and lower eyelids: Secondary | ICD-10-CM | POA: Diagnosis not present

## 2023-05-05 DIAGNOSIS — H353111 Nonexudative age-related macular degeneration, right eye, early dry stage: Secondary | ICD-10-CM | POA: Diagnosis not present

## 2023-05-05 DIAGNOSIS — H353122 Nonexudative age-related macular degeneration, left eye, intermediate dry stage: Secondary | ICD-10-CM | POA: Diagnosis not present

## 2023-05-05 DIAGNOSIS — H401112 Primary open-angle glaucoma, right eye, moderate stage: Secondary | ICD-10-CM | POA: Diagnosis not present

## 2023-05-05 LAB — HM DIABETES EYE EXAM

## 2023-05-05 MED ORDER — DORZOLAMIDE HCL-TIMOLOL MAL 2-0.5 % OP SOLN
1.0000 [drp] | Freq: Two times a day (BID) | OPHTHALMIC | 3 refills | Status: AC
  Filled 2023-05-05: qty 10, 50d supply, fill #0
  Filled 2023-06-06: qty 10, 30d supply, fill #0
  Filled 2023-07-17: qty 10, 30d supply, fill #1
  Filled 2023-12-11: qty 10, 30d supply, fill #2

## 2023-05-17 ENCOUNTER — Other Ambulatory Visit (HOSPITAL_COMMUNITY): Payer: Self-pay

## 2023-06-05 ENCOUNTER — Other Ambulatory Visit: Payer: Self-pay

## 2023-06-05 ENCOUNTER — Other Ambulatory Visit (HOSPITAL_COMMUNITY): Payer: Self-pay

## 2023-06-06 ENCOUNTER — Other Ambulatory Visit (HOSPITAL_COMMUNITY): Payer: Self-pay

## 2023-06-20 ENCOUNTER — Other Ambulatory Visit (HOSPITAL_COMMUNITY): Payer: Self-pay

## 2023-06-20 ENCOUNTER — Encounter: Payer: Self-pay | Admitting: Nurse Practitioner

## 2023-06-20 ENCOUNTER — Ambulatory Visit (INDEPENDENT_AMBULATORY_CARE_PROVIDER_SITE_OTHER): Payer: 59 | Admitting: Nurse Practitioner

## 2023-06-20 VITALS — BP 130/60 | HR 70 | Temp 98.0°F | Ht 64.0 in | Wt 181.0 lb

## 2023-06-20 DIAGNOSIS — E785 Hyperlipidemia, unspecified: Secondary | ICD-10-CM

## 2023-06-20 DIAGNOSIS — E1169 Type 2 diabetes mellitus with other specified complication: Secondary | ICD-10-CM | POA: Diagnosis not present

## 2023-06-20 DIAGNOSIS — Z23 Encounter for immunization: Secondary | ICD-10-CM | POA: Insufficient documentation

## 2023-06-20 DIAGNOSIS — I700 Atherosclerosis of aorta: Secondary | ICD-10-CM | POA: Diagnosis not present

## 2023-06-20 DIAGNOSIS — Z6831 Body mass index (BMI) 31.0-31.9, adult: Secondary | ICD-10-CM

## 2023-06-20 DIAGNOSIS — Z7984 Long term (current) use of oral hypoglycemic drugs: Secondary | ICD-10-CM | POA: Diagnosis not present

## 2023-06-20 DIAGNOSIS — I119 Hypertensive heart disease without heart failure: Secondary | ICD-10-CM | POA: Diagnosis not present

## 2023-06-20 DIAGNOSIS — E6609 Other obesity due to excess calories: Secondary | ICD-10-CM

## 2023-06-20 DIAGNOSIS — H401133 Primary open-angle glaucoma, bilateral, severe stage: Secondary | ICD-10-CM | POA: Diagnosis not present

## 2023-06-20 DIAGNOSIS — R21 Rash and other nonspecific skin eruption: Secondary | ICD-10-CM

## 2023-06-20 DIAGNOSIS — E669 Obesity, unspecified: Secondary | ICD-10-CM | POA: Insufficient documentation

## 2023-06-20 MED ORDER — METFORMIN HCL 500 MG PO TABS
500.0000 mg | ORAL_TABLET | Freq: Every day | ORAL | 1 refills | Status: DC
Start: 2023-06-20 — End: 2023-10-24
  Filled 2023-06-20: qty 90, 90d supply, fill #0
  Filled 2023-10-11: qty 90, 90d supply, fill #1

## 2023-06-20 MED ORDER — OLMESARTAN MEDOXOMIL 20 MG PO TABS
20.0000 mg | ORAL_TABLET | Freq: Two times a day (BID) | ORAL | 3 refills | Status: DC
Start: 2023-06-20 — End: 2023-10-24
  Filled 2023-06-20: qty 60, 30d supply, fill #0
  Filled 2023-07-17: qty 60, 30d supply, fill #1
  Filled 2023-10-11: qty 60, 30d supply, fill #2

## 2023-06-20 NOTE — Assessment & Plan Note (Signed)
She has a scaly type erythematous rash around her eyes, will check autoimmune panel.

## 2023-06-20 NOTE — Assessment & Plan Note (Signed)
HgbA1c was slightly elevated at last visit but still good for her age. Continue following healthy diet and regular exercise as tolerated

## 2023-06-20 NOTE — Assessment & Plan Note (Signed)
Continue Statin, tolerating well

## 2023-06-20 NOTE — Assessment & Plan Note (Signed)
Blood pressure is controlled, continue focusing on healthy lifestyle changes

## 2023-06-20 NOTE — Assessment & Plan Note (Signed)
She is encouraged to strive for BMI less than 30 to decrease cardiac risk. Advised to aim for at least 150 minutes of exercise per week.  

## 2023-06-20 NOTE — Assessment & Plan Note (Signed)
Continue f/u with opthalmology.

## 2023-06-20 NOTE — Progress Notes (Signed)
I,Jameka J Llittleton, CMA,acting as a Neurosurgeon for [employer], FNP.,have documented all relevant documentation on the behalf of Arnette Felts, FNP,as directed by  Arnette Felts, FNP while in the presence of Arnette Felts, FNP.  Subjective:  Patient ID: Allison Franco , female    DOB: [birth year] , 82 y.o.   MRN: [id number]  Chief Complaint  Patient presents with   Diabetes    HPI  Patient presents today for a diabetes and blood pressure f/u. Patient reports complaince with medications and has no other concerns today.   BP Readings from Last 3 Encounters: 06/20/23 : 130/60 03/03/23 : (!) 148/84 03/01/23 : 118/66    Diabetes She presents for her follow-up diabetic visit. She has type 2 diabetes mellitus. Pertinent negatives for hypoglycemia include no dizziness or headaches. There are no diabetic associated symptoms. Pertinent negatives for diabetes include no chest pain, no polydipsia, no polyphagia and no polyuria. There are no hypoglycemic complications. Symptoms are stable. There are no diabetic complications. Risk factors for coronary artery disease include sedentary lifestyle and obesity. When asked about current treatments, none were reported. She is following a generally healthy diet. When asked about meal planning, she reported none. She has not had a previous visit with a dietitian. She rarely participates in exercise. (Blood sugar yesterday was 112 and the highest has been 124) An ACE inhibitor/angiotensin II receptor blocker is being taken. She does not see a podiatrist.Eye exam is not current.  Hypertension This is a chronic problem. The current episode started more than 1 year ago. The problem is controlled. Pertinent negatives include no chest pain, headaches, palpitations or shortness of breath. There are no associated agents to hypertension. Past treatments include angiotensin blockers. There are no compliance problems.  There is no history of angina. There is no history of chronic  renal disease.     Past Medical History:  Diagnosis Date   Anxiety    Constipation    Depression    "following husband's death"   Diabetes mellitus without complication (HCC)    Hypertension    KNEE PAIN, BILATERAL 02/13/2009   Qualifier: Diagnosis of  By: Daphine Deutscher FNP, Zena Amos     Tinnitus      Family History  Problem Relation Age of Onset   Diabetes Mother    Hypertension Father    Breast cancer Neg Hx      Current Outpatient Medications:    acetaminophen (TYLENOL) 500 MG tablet, Take 1 tablet (500 mg total) by mouth every 4 (four) hours as needed., Disp: 30 tablet, Rfl: 0   Alcohol Swabs (ALCOHOL WIPES) 70 % PADS, by Does not apply route. Use with blood sugar check and injection of insulin, Disp: , Rfl:    Cholecalciferol (VITAMIN D3) 25 MCG (1000 UT) CAPS, Take 1 capsule (1,000 Units total) by mouth daily., Disp: 60 capsule, Rfl: 1   dorzolamide-timolol (COSOPT) 2-0.5 % ophthalmic solution, Instill 1 drop into both eyes twice a day, Disp: 10 mL, Rfl: 3   dorzolamide-timolol (COSOPT) 2-0.5 % ophthalmic solution, Instill 1 drop into both eyes twice a day, Disp: 10 mL, Rfl: 3   glucose blood (ACCU-CHEK GUIDE) test strip, TEST 3 TIMES A DAY, Disp: 100 strip, Rfl: 3   MELATONIN PO, Take by mouth., Disp: , Rfl:    meloxicam (MOBIC) 7.5 MG tablet, Take 1 tablet (7.5 mg total) by mouth once daily with a  meal, Disp: 30 tablet, Rfl: 1   Multiple Vitamins-Minerals (CENTRUM SILVER 50+WOMEN) TABS, Take 1 tablet  by mouth daily., Disp: , Rfl:    metFORMIN (GLUCOPHAGE) 500 MG tablet, Take 1 tablet (500 mg total) by mouth daily with breakfast., Disp: 90 tablet, Rfl: 1   olmesartan (BENICAR) 20 MG tablet, Take 1 tablet (20 mg total) by mouth in the morning and at bedtime., Disp: 60 tablet, Rfl: 3   Allergies  Allergen Reactions   Aspirin Other (See Comments)    insomnia   Hydroxyzine Other (See Comments)    insomnia   Valium [Diazepam] Other (See Comments)    Insomnia, 05/10/16 pt states  she took as young person- made her depressed     Review of Systems  Constitutional: Negative.   Respiratory:  Negative for shortness of breath.   Cardiovascular:  Negative for chest pain and palpitations.  Endocrine: Negative for polydipsia, polyphagia and polyuria.  Neurological:  Negative for dizziness and headaches.  Psychiatric/Behavioral: Negative.       Today's Vitals   06/20/23 1001  BP: 130/60  Pulse: 70  Temp: 98 F (36.7 C)  TempSrc: Oral  Weight: 181 lb (82.1 kg)  Height: 5\' 4"  (1.626 m)  PainSc: 0-No pain   Body mass index is 31.07 kg/m.  Wt Readings from Last 3 Encounters:  06/20/23 181 lb (82.1 kg)  02/15/23 182 lb (82.6 kg)  12/21/22 182 lb (82.6 kg)      Objective:  Physical Exam Vitals reviewed.  Constitutional:      General: She is not in acute distress.    Appearance: Normal appearance. She is obese.  Cardiovascular:     Rate and Rhythm: Normal rate and regular rhythm.     Pulses: Normal pulses.     Heart sounds: Normal heart sounds. No murmur heard. Pulmonary:     Effort: Pulmonary effort is normal. No respiratory distress.     Breath sounds: Normal breath sounds. No wheezing.  Skin:    General: Skin is warm and dry.     Capillary Refill: Capillary refill takes less than 2 seconds.  Neurological:     General: No focal deficit present.     Mental Status: She is alert and oriented to person, place, and time.     Cranial Nerves: No cranial nerve deficit.     Motor: No weakness.  Psychiatric:        Mood and Affect: Mood normal. Mood is not anxious.        Behavior: Behavior normal.        Thought Content: Thought content normal.        Judgment: Judgment normal.         Assessment And Plan:  Aortic atherosclerosis (HCC) Assessment & Plan: Continue Statin, tolerating well   Orders: -     Lipid panel  Hypertensive heart disease without heart failure Assessment & Plan: Blood pressure is controlled, continue focusing on healthy  lifestyle changes  Orders: -     Basic metabolic panel -     Olmesartan Medoxomil; Take 1 tablet (20 mg total) by mouth in the morning and at bedtime.  Dispense: 60 tablet; Refill: 3  Type 2 diabetes mellitus with hyperlipidemia (HCC) Assessment & Plan: HgbA1c was slightly elevated at last visit but still good for her age. Continue following healthy diet and regular exercise as tolerated  Orders: -     Hemoglobin A1c -     metFORMIN HCl; Take 1 tablet (500 mg total) by mouth daily with breakfast.  Dispense: 90 tablet; Refill: 1 -     Lipid  panel  Facial rash Assessment & Plan: She has a scaly type erythematous rash around her eyes, will check autoimmune panel.   Orders: -     Autoimmune Profile  Primary open angle glaucoma of both eyes, severe stage Assessment & Plan: Continue f/u with opthalmology   Class 1 obesity due to excess calories with serious comorbidity and body mass index (BMI) of 31.0 to 31.9 in adult Assessment & Plan: She is encouraged to strive for BMI less than 30 to decrease cardiac risk. Advised to aim for at least 150 minutes of exercise per week.     Return for controlled DM check 4 months.  Patient was given opportunity to ask questions. Patient verbalized understanding of the plan and was able to repeat key elements of the plan. All questions were answered to their satisfaction.    [name], FNP, have reviewed all documentation for this visit. The documentation on 06/20/23 for the exam, diagnosis, procedures, and orders are all accurate and complete.   IF YOU HAVE BEEN REFERRED TO A SPECIALIST, IT MAY TAKE 1-2 WEEKS TO SCHEDULE/PROCESS THE REFERRAL. IF YOU HAVE NOT HEARD FROM US/SPECIALIST IN TWO WEEKS, PLEASE GIVE [place] A CALL AT [phone].

## 2023-06-21 LAB — AUTOIMMUNE PROFILE
Anti Nuclear Antibody (ANA): NEGATIVE
Complement C3, Serum: 128 mg/dL (ref 82–167)
dsDNA Ab: <1 IU/mL (ref 0–9)

## 2023-06-21 LAB — LIPID PANEL
Chol/HDL Ratio: 3.8 ratio (ref 0.0–4.4)
Cholesterol, Total: 167 mg/dL (ref 100–199)
HDL: 44 mg/dL (ref >39)
LDL Chol Calc (NIH): 98 mg/dL (ref 0–99)
Triglycerides: 140 mg/dL (ref 0–149)
VLDL Cholesterol Cal: 25 mg/dL (ref 5–40)

## 2023-06-21 LAB — BASIC METABOLIC PANEL
BUN/Creatinine Ratio: 26 (ref 12–28)
BUN: 22 mg/dL (ref 8–27)
CO2: 26 mmol/L (ref 20–29)
Calcium: 9.1 mg/dL (ref 8.7–10.3)
Chloride: 101 mmol/L (ref 96–106)
Creatinine, Ser: 0.85 mg/dL (ref 0.57–1.00)
Glucose: 74 mg/dL (ref 70–99)
Potassium: 5.2 mmol/L (ref 3.5–5.2)
Sodium: 137 mmol/L (ref 134–144)
eGFR: 69 mL/min/{1.73_m2} (ref >59)

## 2023-06-21 LAB — HEMOGLOBIN A1C
Est. average glucose Bld gHb Est-mCnc: 126 mg/dL
Hgb A1c MFr Bld: 6 % — ABNORMAL HIGH (ref 4.8–5.6)

## 2023-07-17 ENCOUNTER — Other Ambulatory Visit (HOSPITAL_COMMUNITY): Payer: Self-pay

## 2023-07-18 ENCOUNTER — Other Ambulatory Visit (HOSPITAL_COMMUNITY): Payer: Self-pay

## 2023-07-18 ENCOUNTER — Other Ambulatory Visit: Payer: Self-pay

## 2023-07-18 ENCOUNTER — Emergency Department (HOSPITAL_BASED_OUTPATIENT_CLINIC_OR_DEPARTMENT_OTHER)
Admission: EM | Admit: 2023-07-18 | Discharge: 2023-07-18 | Disposition: A | Discharge status: Home / Self Care | Payer: 59 | Attending: Emergency Medicine | Admitting: Emergency Medicine

## 2023-07-18 ENCOUNTER — Encounter (HOSPITAL_BASED_OUTPATIENT_CLINIC_OR_DEPARTMENT_OTHER): Payer: Self-pay | Admitting: Emergency Medicine

## 2023-07-18 DIAGNOSIS — H6503 Acute serous otitis media, bilateral: Secondary | ICD-10-CM

## 2023-07-18 DIAGNOSIS — Z743 Need for continuous supervision: Secondary | ICD-10-CM | POA: Diagnosis not present

## 2023-07-18 DIAGNOSIS — E119 Type 2 diabetes mellitus without complications: Secondary | ICD-10-CM | POA: Insufficient documentation

## 2023-07-18 DIAGNOSIS — Z7984 Long term (current) use of oral hypoglycemic drugs: Secondary | ICD-10-CM | POA: Insufficient documentation

## 2023-07-18 DIAGNOSIS — H9313 Tinnitus, bilateral: Secondary | ICD-10-CM

## 2023-07-18 DIAGNOSIS — H6693 Otitis media, unspecified, bilateral: Secondary | ICD-10-CM | POA: Diagnosis not present

## 2023-07-18 DIAGNOSIS — R6889 Other general symptoms and signs: Secondary | ICD-10-CM | POA: Diagnosis not present

## 2023-07-18 DIAGNOSIS — I10 Essential (primary) hypertension: Secondary | ICD-10-CM | POA: Diagnosis not present

## 2023-07-18 MED ORDER — AMOXICILLIN-POT CLAVULANATE 500-125 MG PO TABS
1.0000 | ORAL_TABLET | Freq: Three times a day (TID) | ORAL | 0 refills | Status: DC
  Filled 2023-07-18 (×2): qty 21, 7d supply, fill #0

## 2023-07-18 MED ORDER — AMOXICILLIN-POT CLAVULANATE 875-125 MG PO TABS
1.0000 | ORAL_TABLET | Freq: Once | ORAL | Status: AC
  Administered 2023-07-18: 1 via ORAL
  Filled 2023-07-18: qty 1

## 2023-07-18 NOTE — ED Triage Notes (Addendum)
Pt in from home via [facility] with c/o ringing in ears, R greater than L. Pt reports hx of this for several years, but it usually resolves on it's own. No ear pain reported

## 2023-07-18 NOTE — ED Provider Notes (Signed)
[place] EMERGENCY DEPARTMENT AT [facility] Provider Note   CSN: [id number] Arrival date & time: 07/18/23  0102     History  Chief Complaint  Patient presents with   Ringing in Ears    Allison Franco is a 82 y.o. female.  Patient is an 82 year old female with history of type 2 diabetes.  Patient presenting today for evaluation of ringing and pressure in her ears.  This has been worsening over the past several days.  This has been an ongoing problem for her intermittently for several years.  She denies any injury or trauma.  She denies hearing loss.  She denies aspirin or salicylate use.  The history is provided by the patient.       Home Medications Prior to Admission medications   Medication Sig Start Date End Date Taking? Authorizing Provider  acetaminophen (TYLENOL) 500 MG tablet Take 1 tablet (500 mg total) by mouth every 4 (four) hours as needed. 02/21/23   Rising, Lurena Joiner, PA-C  Alcohol Swabs (ALCOHOL WIPES) 70 % PADS by Does not apply route. Use with blood sugar check and injection of insulin    [provider]  Cholecalciferol (VITAMIN D3) 25 MCG (1000 UT) CAPS Take 1 capsule (1,000 Units total) by mouth daily. 06/02/21   Arnette Felts, FNP  dorzolamide-timolol (COSOPT) 2-0.5 % ophthalmic solution Instill 1 drop into both eyes twice a day 01/25/23     dorzolamide-timolol (COSOPT) 2-0.5 % ophthalmic solution Instill 1 drop into both eyes twice a day 05/05/23     glucose blood (ACCU-CHEK GUIDE) test strip TEST 3 TIMES A DAY 08/29/22   Arnette Felts, FNP  MELATONIN PO Take by mouth.    [provider]  meloxicam (MOBIC) 7.5 MG tablet Take 1 tablet (7.5 mg total) by mouth once daily with a  meal 05/04/23   Arnette Felts, FNP  metFORMIN (GLUCOPHAGE) 500 MG tablet Take 1 tablet (500 mg total) by mouth daily with breakfast. 06/20/23   Arnette Felts, FNP  Multiple Vitamins-Minerals (CENTRUM SILVER 50+WOMEN) TABS Take 1 tablet by mouth daily.    [provider]  olmesartan (BENICAR) 20 MG tablet Take 1 tablet (20 mg total) by mouth in the morning and at bedtime. 06/20/23   Arnette Felts, FNP      Allergies    Aspirin, Hydroxyzine, and Valium [diazepam]    Review of Systems   Review of Systems  All other systems reviewed and are negative.   Physical Exam Updated Vital Signs BP (!) 160/80   Pulse 72   Temp 98.2 F (36.8 C) (Oral)   Resp 20   Wt 82.1 kg   SpO2 98%   BMI 31.07 kg/m  Physical Exam Vitals and nursing note reviewed.  Constitutional:      Appearance: Normal appearance.  HENT:     Ears:     Comments: There is fluid noted behind each TM.  There is mild erythema of the TMs.  Ear canals look normal. Pulmonary:     Effort: Pulmonary effort is normal.  Skin:    General: Skin is warm and dry.  Neurological:     Mental Status: She is alert.     ED Results / Procedures / Treatments   Labs (all labs ordered are listed, but only abnormal results are displayed) Labs Reviewed - No data to display  EKG None  Radiology No results found.  Procedures Procedures    Medications Ordered in ED Medications  amoxicillin-clavulanate (AUGMENTIN) 875-125 MG per tablet  1 tablet (has no administration in time range)    ED Course/ Medical Decision Making/ A&P  Patient presenting with complaints of pressure and ringing in her ears.  She has what appears to be a developing otitis media.  This will be treated with Augmentin.  Patient to follow-up as needed if not improving.  Final Clinical Impression(s) / ED Diagnoses Final diagnoses:  None    Rx / DC Orders ED Discharge Orders     None         Geoffery Lyons, MD 07/18/23 [phone]

## 2023-07-18 NOTE — Discharge Instructions (Signed)
Begin taking Augmentin as prescribed.  Follow-up with ENT if symptoms are not improving in the next few days.  The contact information for [facility] ear, nose[facility] has been provided in this discharge summary for you to call and make these arrangements.

## 2023-07-19 ENCOUNTER — Telehealth: Payer: Self-pay

## 2023-07-19 NOTE — Transitions of Care (Post Inpatient/ED Visit) (Signed)
   07/19/2023  Name: Allison Franco MRN: [id number] DOB: 1941/03/19  Today's TOC FU Call Status: Today's TOC FU Call Status:: Unsuccessful Call (1st Attempt) Unsuccessful Call (1st Attempt) Date: 07/19/23  Attempted to reach the patient regarding the most recent Inpatient/ED visit.  Follow Up Plan: Additional outreach attempts will be made to reach the patient to complete the Transitions of Care (Post Inpatient/ED visit) call.   Signature YL,RMA

## 2023-07-20 ENCOUNTER — Other Ambulatory Visit (HOSPITAL_COMMUNITY): Payer: Self-pay

## 2023-07-20 ENCOUNTER — Ambulatory Visit (INDEPENDENT_AMBULATORY_CARE_PROVIDER_SITE_OTHER): Payer: 59 | Admitting: Family Medicine

## 2023-07-20 ENCOUNTER — Telehealth: Payer: Self-pay

## 2023-07-20 ENCOUNTER — Encounter: Payer: Self-pay | Admitting: Family Medicine

## 2023-07-20 VITALS — BP 120/70 | HR 65 | Temp 98.2°F | Ht 64.0 in | Wt 181.0 lb

## 2023-07-20 DIAGNOSIS — H6993 Unspecified Eustachian tube disorder, bilateral: Secondary | ICD-10-CM | POA: Diagnosis not present

## 2023-07-20 DIAGNOSIS — J302 Other seasonal allergic rhinitis: Secondary | ICD-10-CM | POA: Diagnosis not present

## 2023-07-20 DIAGNOSIS — H9313 Tinnitus, bilateral: Secondary | ICD-10-CM | POA: Diagnosis not present

## 2023-07-20 MED ORDER — TRIAMCINOLONE ACETONIDE 40 MG/ML IJ SUSP
60.0000 mg | Freq: Once | INTRAMUSCULAR | Status: AC
Start: 2023-07-20 — End: 2023-07-20
  Administered 2023-07-20: 60 mg via INTRAMUSCULAR

## 2023-07-20 MED ORDER — CETIRIZINE HCL 10 MG PO TABS
5.0000 mg | ORAL_TABLET | Freq: Every day | ORAL | 0 refills | Status: DC
Start: 2023-07-20 — End: 2024-01-30
  Filled 2023-07-20: qty 90, 180d supply, fill #0
  Filled 2023-10-11: qty 100, 200d supply, fill #0

## 2023-07-20 NOTE — Progress Notes (Signed)
I,Jameka J Llittleton, CMA,acting as a Neurosurgeon for [employer], NP.,have documented all relevant documentation on the behalf of Ellender Hose, NP,as directed by  Ellender Hose, NP while in the presence of Ellender Hose, NP.  Subjective:  Patient ID: Allison Franco , female    DOB: 1941/07/16 , 82 y.o.   MRN: [id number]  Chief Complaint  Patient presents with   ER F/U    HPI  Patient presents today for a ER follow up. She went to [facility] for ear pain/ring in the ear. Patient reports she is feeling the same. She stated she hears a ringing sensation/hissing sound in her ears that stated in Aug 15, 2018 that started after the death of her husband. She said the Augmentin given to her in the hospital is not helping her. Patient states she has a referral to ENT but it is about a month away .  Patient denies depression or anxiety when questioned.     Past Medical History:  Diagnosis Date   Anxiety    Constipation    Depression    "following husband's death"   Diabetes mellitus without complication (HCC)    Hypertension    KNEE PAIN, BILATERAL 02/13/2009   Qualifier: Diagnosis of  By: Daphine Deutscher FNP, Zena Amos     Tinnitus      Family History  Problem Relation Age of Onset   Diabetes Mother    Hypertension Father    Breast cancer Neg Hx      Current Outpatient Medications:    cetirizine (ZYRTEC) 10 MG tablet, Take 1/2 tablet (5 mg total) by mouth daily., Disp: 90 tablet, Rfl: 0   acetaminophen (TYLENOL) 500 MG tablet, Take 1 tablet (500 mg total) by mouth every 4 (four) hours as needed., Disp: 30 tablet, Rfl: 0   Alcohol Swabs (ALCOHOL WIPES) 70 % PADS, by Does not apply route. Use with blood sugar check and injection of insulin, Disp: , Rfl:    amoxicillin-clavulanate (AUGMENTIN) 500-125 MG tablet, Take 1 tablet by mouth every 8 (eight) hours., Disp: 21 tablet, Rfl: 0   Cholecalciferol (VITAMIN D3) 25 MCG (1000 UT) CAPS, Take 1 capsule (1,000 Units total) by mouth daily., Disp: 60 capsule,  Rfl: 1   dorzolamide-timolol (COSOPT) 2-0.5 % ophthalmic solution, Instill 1 drop into both eyes twice a day, Disp: 10 mL, Rfl: 3   dorzolamide-timolol (COSOPT) 2-0.5 % ophthalmic solution, Instill 1 drop into both eyes twice a day, Disp: 10 mL, Rfl: 3   glucose blood (ACCU-CHEK GUIDE) test strip, TEST 3 TIMES A DAY, Disp: 100 strip, Rfl: 3   MELATONIN PO, Take by mouth., Disp: , Rfl:    meloxicam (MOBIC) 7.5 MG tablet, Take 1 tablet (7.5 mg total) by mouth once daily with a  meal, Disp: 30 tablet, Rfl: 1   metFORMIN (GLUCOPHAGE) 500 MG tablet, Take 1 tablet (500 mg total) by mouth daily with breakfast., Disp: 90 tablet, Rfl: 1   Multiple Vitamins-Minerals (CENTRUM SILVER 50+WOMEN) TABS, Take 1 tablet by mouth daily., Disp: , Rfl:    olmesartan (BENICAR) 20 MG tablet, Take 1 tablet (20 mg total) by mouth in the morning and at bedtime., Disp: 60 tablet, Rfl: 3   Allergies  Allergen Reactions   Aspirin Other (See Comments)    insomnia   Hydroxyzine Other (See Comments)    insomnia   Valium [Diazepam] Other (See Comments)    Insomnia, 05/10/16 pt states she took as young person- made her depressed     Review of Systems  Constitutional: Negative.   HENT: Negative.    Eyes: Negative.   Respiratory: Negative.    Cardiovascular: Negative.   Neurological: Negative.   Psychiatric/Behavioral:  Negative for confusion and hallucinations.      Today's Vitals   07/20/23 1117  BP: 120/70  Pulse: 65  Temp: 98.2 F (36.8 C)  Weight: 181 lb (82.1 kg)  Height: 5\' 4"  (1.626 m)  PainSc: 0-No pain   Body mass index is 31.07 kg/m.  Wt Readings from Last 3 Encounters:  07/20/23 181 lb (82.1 kg)  07/18/23 181 lb (82.1 kg)  06/20/23 181 lb (82.1 kg)    Objective:  Physical Exam HENT:     Right Ear: Tympanic membrane normal. No tenderness. Tympanic membrane is not erythematous.     Left Ear: Tympanic membrane normal. No tenderness. Tympanic membrane is not erythematous.  Pulmonary:      Effort: Pulmonary effort is normal.     Breath sounds: Normal breath sounds.  Musculoskeletal:     Cervical back: Normal range of motion.  Skin:    General: Skin is warm.  Neurological:     Mental Status: She is alert and oriented to person, place, and time.         Assessment And Plan:  Tinnitus of both ears Assessment & Plan: Advised to keep ENT appontment   Dysfunction of Eustachian tube, bilateral -     Triamcinolone Acetonide  Seasonal allergies -     Cetirizine HCl; Take 1/2 tablet (5 mg total) by mouth daily.  Dispense: 90 tablet; Refill: 0    Return if symptoms worsen or fail to improve.  Patient was given opportunity to ask questions. Patient verbalized understanding of the plan and was able to repeat key elements of the plan. All questions were answered to their satisfaction.    I, Ellender Hose, NP, have reviewed all documentation for this visit. The documentation on 07/29/23 for the exam, diagnosis, procedures, and orders are all accurate and complete.   IF YOU HAVE BEEN REFERRED TO A SPECIALIST, IT MAY TAKE 1-2 WEEKS TO SCHEDULE/PROCESS THE REFERRAL. IF YOU HAVE NOT HEARD FROM US/SPECIALIST IN TWO WEEKS, PLEASE GIVE [place] A CALL AT [phone].

## 2023-07-20 NOTE — Transitions of Care (Post Inpatient/ED Visit) (Signed)
   07/20/2023  Name: Raetta Huestis MRN: [id number] DOB: [birth year]  Today's TOC FU Call Status: Today's TOC FU Call Status:: Successful TOC FU Call Completed TOC FU Call Complete Date: 07/20/23  Transition Care Management Follow-up Telephone Call Date of Discharge: 07/18/23 Discharge Facility: [facility] ([facility] Type of Discharge: Emergency Department Reason for ED Visit: Other: How have you been since you were released from the hospital?: Same Any questions or concerns?: No  Items Reviewed: Did you receive and understand the discharge instructions provided?: Yes Medications obtained,verified, and reconciled?: Yes (Medications Reviewed) Any new allergies since your discharge?: No Dietary orders reviewed?: No Do you have support at home?: No  Medications Reviewed Today: Medications Reviewed Today   Medications were not reviewed in this encounter     Home Care and Equipment/Supplies: Were Home Health Services Ordered?: NA Any new equipment or medical supplies ordered?: NA  Functional Questionnaire: Do you need assistance with bathing/showering or dressing?: No Do you need assistance with meal preparation?: No Do you need assistance with eating?: No Do you have difficulty maintaining continence: No Do you need assistance with getting out of bed/getting out of a chair/moving?: No Do you have difficulty managing or taking your medications?: No  Follow up appointments reviewed: PCP Follow-up appointment confirmed?: Yes Date of PCP follow-up appointment?: 07/20/23 Follow-up Provider: Ellender Hose [facility] Specialist [facility] Follow-up appointment confirmed?: Yes Date of Specialist follow-up appointment?: 08/03/23 Follow-Up Specialty Provider:: ENT Do you need transportation to your follow-up appointment?: No Do you understand care options if your condition(s) worsen?: Yes-patient verbalized understanding    SIGNATUREYL,RMA

## 2023-07-29 DIAGNOSIS — J302 Other seasonal allergic rhinitis: Secondary | ICD-10-CM | POA: Insufficient documentation

## 2023-07-29 DIAGNOSIS — H6993 Unspecified Eustachian tube disorder, bilateral: Secondary | ICD-10-CM | POA: Insufficient documentation

## 2023-07-29 NOTE — Assessment & Plan Note (Signed)
Advised to keep ENT appontment

## 2023-08-03 DIAGNOSIS — H9313 Tinnitus, bilateral: Secondary | ICD-10-CM | POA: Diagnosis not present

## 2023-08-03 DIAGNOSIS — H903 Sensorineural hearing loss, bilateral: Secondary | ICD-10-CM | POA: Diagnosis not present

## 2023-08-03 DIAGNOSIS — H9311 Tinnitus, right ear: Secondary | ICD-10-CM | POA: Diagnosis not present

## 2023-10-11 ENCOUNTER — Other Ambulatory Visit (HOSPITAL_COMMUNITY): Payer: Self-pay

## 2023-10-17 ENCOUNTER — Other Ambulatory Visit (HOSPITAL_COMMUNITY): Payer: Self-pay

## 2023-10-17 ENCOUNTER — Encounter (HOSPITAL_COMMUNITY): Payer: Self-pay | Admitting: *Deleted

## 2023-10-17 ENCOUNTER — Ambulatory Visit (HOSPITAL_COMMUNITY)
Admission: EM | Admit: 2023-10-17 | Discharge: 2023-10-17 | Disposition: A | Discharge status: Home / Self Care | Payer: 59 | Attending: Nurse Practitioner | Admitting: Nurse Practitioner

## 2023-10-17 DIAGNOSIS — R062 Wheezing: Secondary | ICD-10-CM | POA: Diagnosis not present

## 2023-10-17 MED ORDER — ALBUTEROL SULFATE HFA 108 (90 BASE) MCG/ACT IN AERS
1.0000 | INHALATION_SPRAY | Freq: Four times a day (QID) | RESPIRATORY_TRACT | 0 refills | Status: AC | PRN
  Filled 2023-10-17: qty 6.7, 25d supply, fill #0

## 2023-10-17 NOTE — ED Triage Notes (Signed)
Pt states she has been wheezing for 3-4 years she seen her PCP and was given nasal spray. She feels that it has been getting worse now that it is cold season.

## 2023-10-17 NOTE — Discharge Instructions (Signed)
Your overall exam was negative for wheezing.  You have been prescribed an albuterol inhaler which will help open up your bronchioles and lungs.  This will assist with breathing.   You do have some swelling in your lower extremities.  The recommendation is for you to call your primary care provider and have them to evaluate you for the leg swelling.  They may want to do some lab work to ensure that there is nothing else going on.

## 2023-10-17 NOTE — ED Provider Notes (Signed)
[facility]    CSN: [id number] Arrival date & time: 10/17/23  1323      History   Chief Complaint Chief Complaint  Patient presents with   Wheezing    HPI Allison Franco is a 82 y.o. female.   HPI  She is in today for evaluation of wheezing.  She reports that when she walks she noticed that she wheezes.  She denies shortness of breath or chest pain.  She denies a history of asthma.  She denies fever, chills,nasal congestion or chest congestion or cough..  She reports that she has had some type of allergy symptoms in the past and was given a nasal spray which caused her to have a fungus.   Past Medical History:  Diagnosis Date   Anxiety    Constipation    Depression    "following husband's death"   Diabetes mellitus without complication (HCC)    Hypertension    KNEE PAIN, BILATERAL 02/13/2009   Qualifier: Diagnosis of  By: Daphine Deutscher FNP, Zena Amos     Tinnitus     Patient Active Problem List   Diagnosis Date Noted   Seasonal allergies 07/29/2023   Dysfunction of Eustachian tube, bilateral 07/29/2023   Obesity (BMI 30.0-34.9) 06/20/2023   Primary open angle glaucoma of both eyes, severe stage 06/20/2023   Facial rash 06/20/2023   Aortic atherosclerosis (HCC) 02/23/2023   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/23/2023   Constipation 02/14/2023   Unilateral primary osteoarthritis, left knee 05/18/2022   Inguinal hernia with bowel obstruction 08/07/2019   Incarcerated left inguinal hernia 08/07/2019   Sensorineural hearing loss (SNHL) of right ear with restricted hearing of left ear 05/21/2019   Tinnitus of both ears 05/21/2019   Anemia 08/31/2018   Type 2 diabetes mellitus without complication, without long-term current use of insulin (HCC) 07/02/2018   Obesity 10/20/2013   Allergic rhinitis 08/11/2009   Chronic pain of both knees 02/13/2009   INSOMNIA 07/17/2008    Past Surgical History:  Procedure Laterality Date   ABDOMINAL HYSTERECTOMY      COLON RESECTION N/A 08/07/2019   Procedure: DIAGNOSTIC LAPAROSCOPY WITH OPEN LEFT INGUINAL HERNIA WITH MESH;  Surgeon: Luretha Murphy, MD;  Location: [facility];  Service: General;  Laterality: N/A;   fribroid surgery      OB History   No obstetric history on file.      Home Medications    Prior to Admission medications   Medication Sig Start Date End Date Taking? Authorizing Provider  albuterol (VENTOLIN HFA) 108 (90 Base) MCG/ACT inhaler Inhale 1-2 puffs into the lungs every 6 (six) hours as needed for wheezing or shortness of breath. 10/17/23  Yes Barbette Merino, NP  cetirizine (ZYRTEC) 10 MG tablet Take 1/2 tablet (5 mg total) by mouth daily. 07/20/23  Yes Ellender Hose, NP  Cholecalciferol (VITAMIN D3) 25 MCG (1000 UT) CAPS Take 1 capsule (1,000 Units total) by mouth daily. 06/02/21  Yes Arnette Felts, FNP  dorzolamide-timolol (COSOPT) 2-0.5 % ophthalmic solution Instill 1 drop into both eyes twice a day 01/25/23  Yes   dorzolamide-timolol (COSOPT) 2-0.5 % ophthalmic solution Instill 1 drop into both eyes twice a day 05/05/23  Yes   MELATONIN PO Take by mouth.   Yes [provider]  metFORMIN (GLUCOPHAGE) 500 MG tablet Take 1 tablet (500 mg total) by mouth daily with breakfast. 06/20/23  Yes Arnette Felts, FNP  Multiple Vitamins-Minerals (CENTRUM SILVER 50+WOMEN) TABS Take 1 tablet by mouth daily.   Yes  [provider]  olmesartan (BENICAR) 20 MG tablet Take 1 tablet (20 mg total) by mouth in the morning and at bedtime. 06/20/23  Yes Arnette Felts, FNP  acetaminophen (TYLENOL) 500 MG tablet Take 1 tablet (500 mg total) by mouth every 4 (four) hours as needed. 02/21/23   Rising, Lurena Joiner, PA-C  Alcohol Swabs (ALCOHOL WIPES) 70 % PADS by Does not apply route. Use with blood sugar check and injection of insulin    [provider]  glucose blood (ACCU-CHEK GUIDE) test strip TEST 3 TIMES A DAY 08/29/22   Arnette Felts, FNP  meloxicam (MOBIC) 7.5 MG tablet Take 1 tablet (7.5 mg  total) by mouth once daily with a  meal 05/04/23   Arnette Felts, FNP    Family History Family History  Problem Relation Age of Onset   Diabetes Mother    Hypertension Father    Breast cancer Neg Hx     Social History Social History   Tobacco Use   Smoking status: Never   Smokeless tobacco: Never  Vaping Use   Vaping status: Never Used  Substance Use Topics   Alcohol use: No   Drug use: No     Allergies   Aspirin, Hydroxyzine, and Valium [diazepam]   Review of Systems Review of Systems   Physical Exam Triage Vital Signs ED Triage Vitals  Encounter Vitals Group     BP 10/17/23 1342 119/76     Systolic BP Percentile --      Diastolic BP Percentile --      Pulse Rate 10/17/23 1342 71     Resp 10/17/23 1342 18     Temp 10/17/23 1342 98 F (36.7 C)     Temp Source 10/17/23 1342 Oral     SpO2 10/17/23 1342 97 %     Weight --      Height --      Head Circumference --      Peak Flow --      Pain Score 10/17/23 1341 0     Pain Loc --      Pain Education --      Exclude from Growth Chart --    No data found.  Updated Vital Signs BP 119/76 (BP Location: Left Arm)   Pulse 71   Temp 98 F (36.7 C) (Oral)   Resp 18   SpO2 97%   Visual Acuity Right Eye Distance:   Left Eye Distance:   Bilateral Distance:    Right Eye Near:   Left Eye Near:    Bilateral Near:     Physical Exam Constitutional:      General: She is not in acute distress.    Appearance: She is obese. She is not ill-appearing, toxic-appearing or diaphoretic.  HENT:     Head: Normocephalic and atraumatic.     Nose: Nose normal.     Mouth/Throat:     Mouth: Mucous membranes are dry.  Eyes:     Pupils: Pupils are equal, round, and reactive to light.  Cardiovascular:     Rate and Rhythm: Normal rate and regular rhythm.     Pulses: Normal pulses.     Heart sounds: Normal heart sounds.  Pulmonary:     Effort: Pulmonary effort is normal.     Breath sounds: Normal breath sounds.   Musculoskeletal:     Cervical back: Normal range of motion.     Right lower leg: Edema (trace to 1+) present.     Left lower  leg: Edema (trace to 1+) present.  Skin:    General: Skin is warm and dry.     Capillary Refill: Capillary refill takes less than 2 seconds.  Neurological:     General: No focal deficit present.     Mental Status: She is alert and oriented to person, place, and time.  Psychiatric:        Mood and Affect: Mood normal.        Behavior: Behavior normal.      UC Treatments / Results  Labs (all labs ordered are listed, but only abnormal results are displayed) Labs Reviewed - No data to display  EKG   Radiology No results found.  Procedures Procedures (including critical care time)  Medications Ordered in UC Medications - No data to display  Initial Impression / Assessment and Plan / UC Course  I have reviewed the triage vital signs and the nursing notes.  Pertinent labs & imaging results that were available during my care of the patient were reviewed by me and considered in my medical decision making (see chart for details).     wheezing Final Clinical Impressions(s) / UC Diagnoses   Final diagnoses:  Wheezing     Discharge Instructions      Your overall exam was negative for wheezing.  You have been prescribed an albuterol inhaler which will help open up your bronchioles and lungs.  This will assist with breathing.   You do have some swelling in your lower extremities.  The recommendation is for you to call your primary care provider and have them to evaluate you for the leg swelling.  They may want to do some lab work to ensure that there is nothing else going on.     ED Prescriptions     Medication Sig Dispense Auth. Provider   albuterol (VENTOLIN HFA) 108 (90 Base) MCG/ACT inhaler Inhale 1-2 puffs into the lungs every 6 (six) hours as needed for wheezing or shortness of breath. 6.7 g Barbette Merino, NP      PDMP not reviewed  this encounter.   Thad Ranger Jasper, NP 10/17/23 1438

## 2023-10-23 ENCOUNTER — Other Ambulatory Visit (HOSPITAL_COMMUNITY): Payer: Self-pay

## 2023-10-23 ENCOUNTER — Ambulatory Visit: Payer: 59 | Admitting: Nurse Practitioner

## 2023-10-23 NOTE — Progress Notes (Unsigned)
Madelaine Bhat, CMA,acting as a Neurosurgeon for Arnette Felts, FNP.,have documented all relevant documentation on the behalf of Arnette Felts, FNP,as directed by  Arnette Felts, FNP while in the presence of Arnette Felts, FNP.  Subjective:  Patient ID: Allison Franco , female    DOB: 1941-01-06 , 82 y.o.   MRN: [id number]  No chief complaint on file.   HPI  Patient presents today for a  dm follow up, Patient reports compliance with medication. Patient denies any chest pain, SOB, or headaches. Patient has no concerns today.     Past Medical History:  Diagnosis Date  . Anxiety   . Constipation   . Depression    "following husband's death"  . Diabetes mellitus without complication (HCC)   . Hypertension   . KNEE PAIN, BILATERAL 02/13/2009   Qualifier: Diagnosis of  By: Daphine Deutscher FNP, Zena Amos    . Tinnitus      Family History  Problem Relation Age of Onset  . Diabetes Mother   . Hypertension Father   . Breast cancer Neg Hx      Current Outpatient Medications:  .  acetaminophen (TYLENOL) 500 MG tablet, Take 1 tablet (500 mg total) by mouth every 4 (four) hours as needed., Disp: 30 tablet, Rfl: 0 .  albuterol (VENTOLIN HFA) 108 (90 Base) MCG/ACT inhaler, Inhale 1-2 puffs into the lungs every 6 (six) hours as needed for wheezing or shortness of breath., Disp: 6.7 g, Rfl: 0 .  Alcohol Swabs (ALCOHOL WIPES) 70 % PADS, by Does not apply route. Use with blood sugar check and injection of insulin, Disp: , Rfl:  .  cetirizine (ZYRTEC) 10 MG tablet, Take 1/2 tablet (5 mg total) by mouth daily., Disp: 100 tablet, Rfl: 0 .  Cholecalciferol (VITAMIN D3) 25 MCG (1000 UT) CAPS, Take 1 capsule (1,000 Units total) by mouth daily., Disp: 60 capsule, Rfl: 1 .  dorzolamide-timolol (COSOPT) 2-0.5 % ophthalmic solution, Instill 1 drop into both eyes twice a day, Disp: 10 mL, Rfl: 3 .  dorzolamide-timolol (COSOPT) 2-0.5 % ophthalmic solution, Instill 1 drop into both eyes twice a day, Disp: 10 mL, Rfl: 3 .  glucose  blood (ACCU-CHEK GUIDE) test strip, TEST 3 TIMES A DAY, Disp: 100 strip, Rfl: 3 .  MELATONIN PO, Take by mouth., Disp: , Rfl:  .  meloxicam (MOBIC) 7.5 MG tablet, Take 1 tablet (7.5 mg total) by mouth once daily with a  meal, Disp: 30 tablet, Rfl: 1 .  metFORMIN (GLUCOPHAGE) 500 MG tablet, Take 1 tablet (500 mg total) by mouth daily with breakfast., Disp: 90 tablet, Rfl: 1 .  Multiple Vitamins-Minerals (CENTRUM SILVER 50+WOMEN) TABS, Take 1 tablet by mouth daily., Disp: , Rfl:  .  olmesartan (BENICAR) 20 MG tablet, Take 1 tablet (20 mg total) by mouth in the morning and at bedtime., Disp: 60 tablet, Rfl: 3   Allergies  Allergen Reactions  . Aspirin Other (See Comments)    insomnia  . Hydroxyzine Other (See Comments)    insomnia  . Valium [Diazepam] Other (See Comments)    Insomnia, 05/10/16 pt states she took as young person- made her depressed     Review of Systems   There were no vitals filed for this visit. There is no height or weight on file to calculate BMI.  Wt Readings from Last 3 Encounters:  07/20/23 181 lb (82.1 kg)  07/18/23 181 lb (82.1 kg)  06/20/23 181 lb (82.1 kg)    The ASCVD Risk score (Arnett DK,  et al., 2019) failed to calculate for the following reasons:   The 2019 ASCVD risk score is only valid for ages 58 to 48  Objective:  Physical Exam      Assessment And Plan:  Type 2 diabetes mellitus with hyperlipidemia (HCC)    No follow-ups on file.  Patient was given opportunity to ask questions. Patient verbalized understanding of the plan and was able to repeat key elements of the plan. All questions were answered to their satisfaction.    [name], FNP, have reviewed all documentation for this visit. The documentation on 10/23/23 for the exam, diagnosis, procedures, and orders are all accurate and complete.   IF YOU HAVE BEEN REFERRED TO A SPECIALIST, IT MAY TAKE 1-2 WEEKS TO SCHEDULE/PROCESS THE REFERRAL. IF YOU HAVE NOT HEARD FROM US/SPECIALIST IN  TWO WEEKS, PLEASE GIVE [place] A CALL AT [phone].

## 2023-10-24 ENCOUNTER — Ambulatory Visit (INDEPENDENT_AMBULATORY_CARE_PROVIDER_SITE_OTHER): Payer: 59 | Admitting: Nurse Practitioner

## 2023-10-24 ENCOUNTER — Other Ambulatory Visit (HOSPITAL_COMMUNITY): Payer: Self-pay

## 2023-10-24 ENCOUNTER — Encounter: Payer: Self-pay | Admitting: Nurse Practitioner

## 2023-10-24 VITALS — BP 120/60 | HR 81 | Temp 98.3°F | Ht 64.0 in | Wt 186.8 lb

## 2023-10-24 DIAGNOSIS — E1169 Type 2 diabetes mellitus with other specified complication: Secondary | ICD-10-CM | POA: Diagnosis not present

## 2023-10-24 DIAGNOSIS — Z6832 Body mass index (BMI) 32.0-32.9, adult: Secondary | ICD-10-CM

## 2023-10-24 DIAGNOSIS — I700 Atherosclerosis of aorta: Secondary | ICD-10-CM | POA: Diagnosis not present

## 2023-10-24 DIAGNOSIS — E785 Hyperlipidemia, unspecified: Secondary | ICD-10-CM

## 2023-10-24 DIAGNOSIS — I119 Hypertensive heart disease without heart failure: Secondary | ICD-10-CM | POA: Diagnosis not present

## 2023-10-24 DIAGNOSIS — Z23 Encounter for immunization: Secondary | ICD-10-CM

## 2023-10-24 DIAGNOSIS — E66811 Obesity, class 1: Secondary | ICD-10-CM

## 2023-10-24 DIAGNOSIS — E661 Drug-induced obesity: Secondary | ICD-10-CM

## 2023-10-24 MED ORDER — OLMESARTAN MEDOXOMIL 20 MG PO TABS
20.0000 mg | ORAL_TABLET | Freq: Two times a day (BID) | ORAL | 1 refills | Status: DC
Start: 2023-10-24 — End: 2024-04-29
  Filled 2023-10-24 – 2023-12-11 (×2): qty 90, 45d supply, fill #0
  Filled 2024-01-31: qty 90, 45d supply, fill #1

## 2023-10-24 MED ORDER — METFORMIN HCL 500 MG PO TABS
500.0000 mg | ORAL_TABLET | Freq: Every day | ORAL | 1 refills | Status: DC
Start: 2023-10-24 — End: 2024-08-01
  Filled 2023-10-24 – 2024-01-30 (×2): qty 90, 90d supply, fill #0
  Filled 2024-04-29: qty 90, 90d supply, fill #1

## 2023-10-24 NOTE — Assessment & Plan Note (Signed)
Covid 19 vaccine given in office observed for 15 minutes without any adverse reaction  

## 2023-10-24 NOTE — Assessment & Plan Note (Signed)
She is encouraged to strive for BMI less than 30 to decrease cardiac risk. Advised to aim for at least 150 minutes of exercise per week.

## 2023-10-24 NOTE — Assessment & Plan Note (Signed)
Continue Statin, tolerating well

## 2023-10-24 NOTE — Assessment & Plan Note (Signed)
HgbA1c is stable. Continue current medications

## 2023-10-24 NOTE — Assessment & Plan Note (Signed)
Blood pressure is controlled, continue focusing on healthy lifestyle changes

## 2023-10-24 NOTE — Assessment & Plan Note (Signed)
Influenza vaccine administered Encouraged to take Tylenol as needed for fever or muscle aches.

## 2023-10-25 LAB — BMP8+EGFR
BUN/Creatinine Ratio: 28 (ref 12–28)
BUN: 21 mg/dL (ref 8–27)
CO2: 27 mmol/L (ref 20–29)
Calcium: 8.9 mg/dL (ref 8.7–10.3)
Chloride: 98 mmol/L (ref 96–106)
Creatinine, Ser: 0.74 mg/dL (ref 0.57–1.00)
Glucose: 64 mg/dL — ABNORMAL LOW (ref 70–99)
Potassium: 4.6 mmol/L (ref 3.5–5.2)
Sodium: 138 mmol/L (ref 134–144)
eGFR: 81 mL/min/{1.73_m2} (ref >59)

## 2023-10-25 LAB — HEMOGLOBIN A1C
Est. average glucose Bld gHb Est-mCnc: 134 mg/dL
Hgb A1c MFr Bld: 6.3 % — ABNORMAL HIGH (ref 4.8–5.6)

## 2023-12-11 ENCOUNTER — Other Ambulatory Visit (HOSPITAL_COMMUNITY): Payer: Self-pay

## 2024-01-03 ENCOUNTER — Ambulatory Visit: Payer: 59

## 2024-01-03 ENCOUNTER — Ambulatory Visit: Payer: Self-pay | Admitting: Nurse Practitioner

## 2024-01-03 VITALS — BP 128/70 | HR 85 | Temp 97.8°F | Ht 64.0 in | Wt 190.0 lb

## 2024-01-03 DIAGNOSIS — Z0000 Encounter for general adult medical examination without abnormal findings: Secondary | ICD-10-CM | POA: Diagnosis not present

## 2024-01-03 NOTE — Patient Instructions (Signed)
 [name] , Thank you for taking time to come for your Medicare Wellness Visit. I appreciate your ongoing commitment to your health goals. Please review the following plan we discussed and let me know if I can assist you in the future.   Referrals/Orders/Follow-Ups/Clinician Recommendations: none  This is a list of the screening recommended for you and due dates:  Health Maintenance  Topic Date Due   Eye exam for diabetics  08/31/2023   Complete foot exam   09/01/2023   COVID-19 Vaccine (6 - 2024-25 season) 12/19/2023   Yearly kidney health urinalysis for diabetes  02/15/2024   Hemoglobin A1C  04/22/2024   Yearly kidney function blood test for diabetes  10/23/2024   Medicare Annual Wellness Visit  01/02/2025   DTaP/Tdap/Td vaccine (3 - Td or Tdap) 01/08/2030   Pneumonia Vaccine  Completed   Flu Shot  Completed   DEXA scan (bone density measurement)  Completed   Zoster (Shingles) Vaccine  Completed   HPV Vaccine  Aged Out   Hepatitis C Screening  Discontinued    Advanced directives: (In Chart) A copy of your advanced directives are scanned into your chart should your provider ever need it.  Next Medicare Annual Wellness Visit scheduled for next year: Yes  insert Preventive Care attachment Insert FALL PREVENTION attachment if needed

## 2024-01-03 NOTE — Progress Notes (Signed)
 Subjective:   Allison Franco is a 83 y.o. female who presents for Medicare Annual (Subsequent) preventive examination.  Visit Complete: In person    Cardiac Risk Factors include: advanced age (>96men, >19 women);diabetes mellitus;hypertension     Objective:    Today's Vitals   01/03/24 0914  BP: 128/70  Pulse: 85  Temp: 97.8 F (36.6 C)  TempSrc: Oral  SpO2: 98%  Weight: 190 lb (86.2 kg)  Height: 5' 4 (1.626 m)   Body mass index is 32.61 kg/m.     01/03/2024    9:22 AM 07/18/2023    1:09 AM 03/03/2023   11:33 AM 03/01/2023   10:26 AM 12/21/2022    9:30 AM 06/07/2022    1:21 AM 12/09/2021   10:39 AM  Advanced Directives  Does Patient Have a Medical Advance Directive? Yes No No No Yes No Yes  Type of Advance Directive Out of facility DNR (pink MOST or yellow form)    Out of facility DNR (pink MOST or yellow form)  Out of facility DNR (pink MOST or yellow form)  Would patient like information on creating a medical advance directive?  No - Patient declined No - Patient declined No - Patient declined       Current Medications (verified) Outpatient Encounter Medications as of 01/03/2024  Medication Sig   acetaminophen  (TYLENOL ) 500 MG tablet Take 1 tablet (500 mg total) by mouth every 4 (four) hours as needed.   albuterol  (VENTOLIN  HFA) 108 (90 Base) MCG/ACT inhaler Inhale 1-2 puffs into the lungs every 6 (six) hours as needed for wheezing or shortness of breath.   Alcohol Swabs (ALCOHOL WIPES) 70 % PADS by Does not apply route. Use with blood sugar check and injection of insulin    cetirizine  (ZYRTEC ) 10 MG tablet Take 1/2 tablet (5 mg total) by mouth daily.   Cholecalciferol  (VITAMIN D3) 25 MCG (1000 UT) CAPS Take 1 capsule (1,000 Units total) by mouth daily.   dorzolamide -timolol  (COSOPT ) 2-0.5 % ophthalmic solution Instill 1 drop into both eyes twice a day   glucose blood (ACCU-CHEK GUIDE) test strip TEST 3 TIMES A DAY   MELATONIN PO Take by mouth.   metFORMIN  (GLUCOPHAGE ) 500  MG tablet Take 1 tablet (500 mg total) by mouth daily with breakfast.   Multiple Vitamins-Minerals (CENTRUM SILVER 50+WOMEN) TABS Take 1 tablet by mouth daily.   olmesartan  (BENICAR ) 20 MG tablet Take 1 tablet (20 mg total) by mouth in the morning and at bedtime.   dorzolamide -timolol  (COSOPT ) 2-0.5 % ophthalmic solution Instill 1 drop into both eyes twice a day   meloxicam  (MOBIC ) 7.5 MG tablet Take 1 tablet (7.5 mg total) by mouth once daily with a  meal (Patient not taking: Reported on 01/03/2024)   No facility-administered encounter medications on file as of 01/03/2024.    Allergies (verified) Aspirin , Hydroxyzine , and Valium  [diazepam ]   History: Past Medical History:  Diagnosis Date   Anxiety    Constipation    Depression    following husband's death   Diabetes mellitus without complication (HCC)    Hypertension    KNEE PAIN, BILATERAL 02/13/2009   Qualifier: Diagnosis of  By: Gladis FNP, Delorise     Tinnitus    Past Surgical History:  Procedure Laterality Date   ABDOMINAL HYSTERECTOMY     COLON RESECTION N/A 08/07/2019   Procedure: DIAGNOSTIC LAPAROSCOPY WITH OPEN LEFT INGUINAL HERNIA WITH MESH;  Surgeon: Gladis Cough, MD;  Location: [facility];  Service: General;  Laterality: N/A;  fribroid surgery     Family History  Problem Relation Age of Onset   Diabetes Mother    Hypertension Father    Breast cancer Neg Hx    Social History   Socioeconomic History   Marital status: Widowed    Spouse name: Not on file   Number of children: 0   Years of education: 92   Highest education level: Not on file  Occupational History   Occupation: employed  Tobacco Use   Smoking status: Never   Smokeless tobacco: Never  Vaping Use   Vaping status: Never Used  Substance and Sexual Activity   Alcohol use: No   Drug use: No   Sexual activity: Not Currently  Other Topics Concern   Not on file  Social History Narrative   Lives alone, husband passed away 2 1/2 years ago    Caffeine use- none   Social Drivers of Corporate Investment Banker Strain: Low Risk  (01/03/2024)   Overall Financial Resource Strain (CARDIA)    Difficulty of Paying Living Expenses: Not hard at all  Food Insecurity: No Food Insecurity (01/03/2024)   Hunger Vital Sign    Worried About Running Out of Food in the Last Year: Never true    Ran Out of Food in the Last Year: Never true  Transportation Needs: No Transportation Needs (01/03/2024)   PRAPARE - Administrator, Civil Service (Medical): No    Lack of Transportation (Non-Medical): No  Physical Activity: Insufficiently Active (01/03/2024)   Exercise Vital Sign    Days of Exercise per Week: 7 days    Minutes of Exercise per Session: 10 min  Stress: No Stress Concern Present (01/03/2024)   [employer] of Occupational Health - Occupational Stress Questionnaire    Feeling of Stress : Not at all  Social Connections: Socially Isolated (01/03/2024)   Social Connection and Isolation Panel [NHANES]    Frequency of Communication with Friends and Family: Once a week    Frequency of Social Gatherings with Friends and Family: Never    Attends Religious Services: More than 4 times per year    Active Member of [employer] or Organizations: No    Attends Banker Meetings: Never    Marital Status: Widowed    Tobacco Counseling Counseling given: Not Answered   Clinical Intake:  Pre-visit preparation completed: Yes  Pain : No/denies pain     Nutritional Status: BMI > 30  Obese Nutritional Risks: None Diabetes: Yes CBG done?: No Did pt. bring in CBG monitor from home?: No  How often do you need to have someone help you when you read instructions, pamphlets, or other written materials from your doctor or pharmacy?: 1 - Never  Interpreter Needed?: No  Information entered by :: NAllen LPN   Activities of Daily Living    01/03/2024    9:16 AM  In your present state of health, do you have any difficulty performing the  following activities:  Hearing? 0  Vision? 0  Difficulty concentrating or making decisions? 0  Walking or climbing stairs? 0  Dressing or bathing? 0  Doing errands, shopping? 0  Preparing Food and eating ? N  Using the Toilet? N  In the past six months, have you accidently leaked urine? N  Do you have problems with loss of bowel control? N  Managing your Medications? N  Managing your Finances? N  Housekeeping or managing your Housekeeping? N    Patient Care Team: Georgina Speaks,  FNP as PCP - General (General Practice) Pearson, Vallie J, RPH (Inactive) (Pharmacist)  Indicate any recent Medical Services you may have received from other than [facility] providers in the past year (date may be approximate).     Assessment:   This is a routine wellness examination for [place].  Hearing/Vision screen Hearing Screening - Comments:: Denies hearing issues Vision Screening - Comments:: Regular eye exams, [facility]   Goals Addressed             This Visit's Progress    Patient Stated       01/03/2024, wants to drink more water       Depression Screen    01/03/2024    9:24 AM 02/15/2023   10:38 AM 12/21/2022    9:31 AM 12/09/2021   10:41 AM 02/24/2021   10:24 AM 01/20/2021   11:44 AM 01/15/2020   11:47 AM  PHQ 2/9 Scores  PHQ - 2 Score 0 0 0 0 0 0 0  PHQ- 9 Score       3    Fall Risk    01/03/2024    9:23 AM 02/15/2023   10:38 AM 12/21/2022    9:31 AM 08/31/2022    9:39 AM 12/09/2021   10:41 AM  Fall Risk   Falls in the past year? 0 0 0 0 0  Number falls in past yr: 0 0 0 0   Injury with Fall? 0 0 0 0   Risk for fall due to : Medication side effect  Medication side effect No Fall Risks Medication side effect  Follow up Falls prevention discussed;Falls evaluation completed Falls evaluation completed Falls prevention discussed;Education provided;Falls evaluation completed Falls evaluation completed Falls evaluation completed;Education provided;Falls prevention discussed     MEDICARE RISK AT HOME: Medicare Risk at Home Any stairs in or around the home?: No If so, are there any without handrails?: No Home free of loose throw rugs in walkways, pet beds, electrical cords, etc?: Yes Adequate lighting in your home to reduce risk of falls?: Yes Life alert?: No Use of a cane, walker or w/c?: No Grab bars in the bathroom?: Yes Shower chair or bench in shower?: Yes Elevated toilet seat or a handicapped toilet?: No  TIMED UP AND GO:  Was the test performed?  Yes  Length of time to ambulate 10 feet: 6 sec Gait slow and steady without use of assistive device    Cognitive Function:        01/03/2024    9:24 AM 12/21/2022    9:32 AM 12/09/2021   10:43 AM 02/24/2021   10:25 AM 01/15/2020   11:49 AM  6CIT Screen  What Year? 0 points 0 points 0 points 0 points 0 points  What month? 0 points 0 points 0 points 0 points 0 points  What time? 0 points 0 points 0 points 0 points 0 points  Count back from 20 0 points 0 points 0 points 0 points 0 points  Months in reverse 0 points 0 points 2 points 0 points 2 points  Repeat phrase 4 points 6 points 2 points 10 points 6 points  Total Score 4 points 6 points 4 points 10 points 8 points    Immunizations Immunization History  Administered Date(s) Administered   Fluad Quad(high Dose 65+) 11/11/2020, 08/23/2021, 08/31/2022   Fluad Trivalent(High Dose 65+) 10/24/2023   Influenza Whole 11/18/2009   Influenza, High Dose Seasonal PF 07/24/2019   Influenza-Unspecified 09/24/2018   PFIZER(Purple Top)SARS-COV-2 Vaccination 02/14/2020,  03/10/2020, 10/28/2020   Pfizer Covid-19 Vaccine Bivalent Booster 5yrs & up 01/27/2022   Pfizer(Comirnaty)Fall Seasonal Vaccine 12 years and older 10/24/2023   Pneumococcal Conjugate-13 01/16/2020   Pneumococcal Polysaccharide-23 02/01/2007   Td 02/01/2007   Tdap 01/09/2020   Zoster Recombinant(Shingrix ) 08/25/2021, 11/03/2021    TDAP status: Up to date  Flu Vaccine status: Up to  date  Pneumococcal vaccine status: Up to date  Covid-19 vaccine status: Completed vaccines  Qualifies for Shingles Vaccine? Yes   Zostavax completed Yes   Shingrix  Completed?: Yes  Screening Tests Health Maintenance  Topic Date Due   OPHTHALMOLOGY EXAM  08/31/2023   FOOT EXAM  09/01/2023   COVID-19 Vaccine (6 - 2024-25 season) 12/19/2023   Diabetic kidney evaluation - Urine ACR  02/15/2024   HEMOGLOBIN A1C  04/22/2024   Diabetic kidney evaluation - eGFR measurement  10/23/2024   Medicare Annual Wellness (AWV)  01/02/2025   DTaP/Tdap/Td (3 - Td or Tdap) 01/08/2030   Pneumonia Vaccine 74+ Years old  Completed   INFLUENZA VACCINE  Completed   DEXA SCAN  Completed   Zoster Vaccines- Shingrix   Completed   HPV VACCINES  Aged Out   Hepatitis C Screening  Discontinued    Health Maintenance  Health Maintenance Due  Topic Date Due   OPHTHALMOLOGY EXAM  08/31/2023   FOOT EXAM  09/01/2023   COVID-19 Vaccine (6 - 2024-25 season) 12/19/2023    Colorectal cancer screening: No longer required.   Mammogram status: Completed 02/09/2023. Repeat every year  Bone Density status: Completed 12/28/2021.   Lung Cancer Screening: (Low Dose CT Chest recommended if Age 66-80 years, 20 pack-year currently smoking OR have quit w/in 15years.) does not qualify.   Lung Cancer Screening Referral: no  Additional Screening:  Hepatitis C Screening: does not qualify;   Vision Screening: Recommended annual ophthalmology exams for early detection of glaucoma and other disorders of the eye. Is the patient up to date with their annual eye exam?  Yes  Who is the provider or what is the name of the office in which the patient attends annual eye exams? [facility] If pt is not established with a provider, would they like to be referred to a provider to establish care? No .   Dental Screening: Recommended annual dental exams for proper oral hygiene  Diabetic Foot Exam: Diabetic Foot Exam: Overdue, Pt  has been advised about the importance in completing this exam. Pt is scheduled for diabetic foot exam on next appointment.  Community Resource Referral / Chronic Care Management: CRR required this visit?  No   CCM required this visit?  No     Plan:     I have personally reviewed and noted the following in the patient's chart:   Medical and social history Use of alcohol, tobacco or illicit drugs  Current medications and supplements including opioid prescriptions. Patient is not currently taking opioid prescriptions. Functional ability and status Nutritional status Physical activity Advanced directives List of other physicians Hospitalizations, surgeries, and ER visits in previous 12 months Vitals Screenings to include cognitive, depression, and falls Referrals and appointments  In addition, I have reviewed and discussed with patient certain preventive protocols, quality metrics, and best practice recommendations. A written personalized care plan for preventive services as well as general preventive health recommendations were provided to patient.     Ardella FORBES Dawn, LPN   05/31/7973   After Visit Summary: (In Person-Printed) AVS printed and given to the patient  Nurse Notes: none

## 2024-01-30 ENCOUNTER — Other Ambulatory Visit (HOSPITAL_COMMUNITY): Payer: Self-pay

## 2024-01-30 ENCOUNTER — Other Ambulatory Visit: Payer: Self-pay | Admitting: Family Medicine

## 2024-01-30 DIAGNOSIS — J302 Other seasonal allergic rhinitis: Secondary | ICD-10-CM

## 2024-01-30 MED ORDER — CETIRIZINE HCL 10 MG PO TABS
5.0000 mg | ORAL_TABLET | Freq: Every day | ORAL | 0 refills | Status: DC
Start: 2024-01-30 — End: 2024-02-28
  Filled 2024-01-30: qty 100, 200d supply, fill #0

## 2024-01-31 ENCOUNTER — Other Ambulatory Visit (HOSPITAL_COMMUNITY): Payer: Self-pay

## 2024-02-02 DIAGNOSIS — H905 Unspecified sensorineural hearing loss: Secondary | ICD-10-CM | POA: Diagnosis not present

## 2024-02-05 ENCOUNTER — Other Ambulatory Visit: Payer: Self-pay

## 2024-02-05 ENCOUNTER — Emergency Department (HOSPITAL_COMMUNITY)

## 2024-02-05 ENCOUNTER — Emergency Department (HOSPITAL_COMMUNITY): Admission: EM | Admit: 2024-02-05 | Discharge: 2024-02-06 | Disposition: A | Discharge status: Home / Self Care

## 2024-02-05 DIAGNOSIS — H9319 Tinnitus, unspecified ear: Secondary | ICD-10-CM | POA: Insufficient documentation

## 2024-02-05 DIAGNOSIS — I10 Essential (primary) hypertension: Secondary | ICD-10-CM | POA: Diagnosis not present

## 2024-02-05 DIAGNOSIS — Z7984 Long term (current) use of oral hypoglycemic drugs: Secondary | ICD-10-CM | POA: Insufficient documentation

## 2024-02-05 DIAGNOSIS — H9313 Tinnitus, bilateral: Secondary | ICD-10-CM | POA: Diagnosis not present

## 2024-02-05 DIAGNOSIS — Z79899 Other long term (current) drug therapy: Secondary | ICD-10-CM | POA: Insufficient documentation

## 2024-02-05 DIAGNOSIS — E119 Type 2 diabetes mellitus without complications: Secondary | ICD-10-CM | POA: Insufficient documentation

## 2024-02-05 LAB — COMPREHENSIVE METABOLIC PANEL
ALT: 24 U/L (ref 0–44)
AST: 28 U/L (ref 15–41)
Albumin: 3.5 g/dL (ref 3.5–5.0)
Alkaline Phosphatase: 73 U/L (ref 38–126)
Anion gap: 10 (ref 5–15)
BUN: 18 mg/dL (ref 8–23)
CO2: 24 mmol/L (ref 22–32)
Calcium: 8.7 mg/dL — ABNORMAL LOW (ref 8.9–10.3)
Chloride: 101 mmol/L (ref 98–111)
Creatinine, Ser: 0.95 mg/dL (ref 0.44–1.00)
GFR, Estimated: 60 mL/min — ABNORMAL LOW (ref >60)
Glucose, Bld: 142 mg/dL — ABNORMAL HIGH (ref 70–99)
Potassium: 4.2 mmol/L (ref 3.5–5.1)
Sodium: 135 mmol/L (ref 135–145)
Total Bilirubin: 0.7 mg/dL (ref 0.0–1.2)
Total Protein: 7.1 g/dL (ref 6.5–8.1)

## 2024-02-05 LAB — CBC
HCT: 41 % (ref 36.0–46.0)
Hemoglobin: 12.7 g/dL (ref 12.0–15.0)
MCH: 28.3 pg (ref 26.0–34.0)
MCHC: 31 g/dL (ref 30.0–36.0)
MCV: 91.5 fL (ref 80.0–100.0)
Platelets: 255 10*3/uL (ref 150–400)
RBC: 4.48 MIL/uL (ref 3.87–5.11)
RDW: 12 % (ref 11.5–15.5)
WBC: 6 10*3/uL (ref 4.0–10.5)
nRBC: 0 % (ref 0.0–0.2)

## 2024-02-05 LAB — SALICYLATE LEVEL: Salicylate Lvl: <7 mg/dL — ABNORMAL LOW (ref 7.0–30.0)

## 2024-02-05 NOTE — ED Notes (Signed)
 Pt in CT.

## 2024-02-05 NOTE — ED Notes (Signed)
 Pt called no answer

## 2024-02-05 NOTE — ED Provider Triage Note (Signed)
 Emergency Medicine Provider Triage Evaluation Note  Allison Franco , a 83 y.o. female  was evaluated in triage.  Pt complains of tinnitus. Patient states she has been seen for them with it for many years but once her ears evaluated.  Review of Systems  Positive: Tinnitus Negative: Vertigo  Physical Exam  BP (!) 140/73 (BP Location: Left Arm)   Pulse 71   Temp 98.4 F (36.9 C)   Resp 15   SpO2 99%  Gen:   Awake, no distress   Resp:  Normal effort  MSK:   Moves extremities without difficulty  Other:    Medical Decision Making  Medically screening exam initiated at 2:49 PM.  Appropriate orders placed.  Allison Franco was informed that the remainder of the evaluation will be completed by another provider, this initial triage assessment does not replace that evaluation, and the importance of remaining in the ED until their evaluation is complete.     Arthor Captain, PA-C 02/05/24 [phone]

## 2024-02-05 NOTE — ED Notes (Signed)
 Called pt no answer

## 2024-02-05 NOTE — ED Triage Notes (Signed)
 Pt arrives via POV. Pt reports ringing in both ears for about a week. Pt states she has been unable to get an appropriate amount of rest due to the ringing. Pt is AxOx4. No other symptoms.

## 2024-02-06 ENCOUNTER — Other Ambulatory Visit (HOSPITAL_COMMUNITY): Payer: Self-pay

## 2024-02-06 NOTE — Discharge Instructions (Addendum)
 Please follow-up with your ear nose and throat doctor on Friday as scheduled.  Return to the ER for worsening symptoms.  Avoid anti-inflammatories such as Aleve or ibuprofen and if you drink caffeine try to cut back on this to see if this helps.  Return to the ER for worsening symptoms.

## 2024-02-06 NOTE — ED Provider Notes (Signed)
 [place] EMERGENCY DEPARTMENT AT [facility] Provider Note   CSN: [id number] Arrival date & time: 02/05/24  1256     History  Chief Complaint  Patient presents with   Tinnitus    Allison Franco is a 83 y.o. female.  83 year old female with past medical history of hypertension and diabetes presenting to the emergency department today with tinnitus.  The patient states she has been dealing with this for the past few months but has been acutely worse over the past week or so.  She states that she has seen an ENT.  She was post to have a follow-up ointment yesterday for hearing aids but apparently they did not come in yet.  She has an appointment on Friday for this.  Resolve this should help with her symptoms.  She denies any dizziness, weakness, numbness, or tingling.  She denies any ear pain or drainage.        Home Medications Prior to Admission medications   Medication Sig Start Date End Date Taking? Authorizing Provider  acetaminophen (TYLENOL) 500 MG tablet Take 1 tablet (500 mg total) by mouth every 4 (four) hours as needed. 02/21/23   Rising, Lurena Joiner, PA-C  albuterol (VENTOLIN HFA) 108 (90 Base) MCG/ACT inhaler Inhale 1-2 puffs into the lungs every 6 (six) hours as needed for wheezing or shortness of breath. 10/17/23   Barbette Merino, NP  Alcohol Swabs (ALCOHOL WIPES) 70 % PADS by Does not apply route. Use with blood sugar check and injection of insulin    [provider]  cetirizine (ZYRTEC) 10 MG tablet Take 1/2 tablet (5 mg total) by mouth daily. 01/30/24   Ellender Hose, NP  Cholecalciferol (VITAMIN D3) 25 MCG (1000 UT) CAPS Take 1 capsule (1,000 Units total) by mouth daily. 06/02/21   Arnette Felts, FNP  dorzolamide-timolol (COSOPT) 2-0.5 % ophthalmic solution Instill 1 drop into both eyes twice a day 01/25/23     dorzolamide-timolol (COSOPT) 2-0.5 % ophthalmic solution Instill 1 drop into both eyes twice a day 05/05/23     glucose blood (ACCU-CHEK GUIDE) test  strip TEST 3 TIMES A DAY 08/29/22   Arnette Felts, FNP  MELATONIN PO Take by mouth.    [provider]  meloxicam (MOBIC) 7.5 MG tablet Take 1 tablet (7.5 mg total) by mouth once daily with a  meal Patient not taking: Reported on 01/03/2024 05/04/23   Arnette Felts, FNP  metFORMIN (GLUCOPHAGE) 500 MG tablet Take 1 tablet (500 mg total) by mouth daily with breakfast. 10/24/23   Arnette Felts, FNP  Multiple Vitamins-Minerals (CENTRUM SILVER 50+WOMEN) TABS Take 1 tablet by mouth daily.    [provider]  olmesartan (BENICAR) 20 MG tablet Take 1 tablet (20 mg total) by mouth in the morning and at bedtime. 10/24/23   Arnette Felts, FNP      Allergies    Aspirin, Hydroxyzine, and Valium [diazepam]    Review of Systems   Review of Systems  Neurological:        Tinnitus  All other systems reviewed and are negative.   Physical Exam Updated Vital Signs BP 137/61 (BP Location: Left Arm)   Pulse 70   Temp 97.8 F (36.6 C) (Oral)   Resp 16   SpO2 97%  Physical Exam Vitals and nursing note reviewed.   Gen: NAD Eyes: PERRL, EOMI HEENT: no oropharyngeal swelling, Tms normal appearing bilaterally with mild scarring noted, no erythema or effusions Neck: trachea midline Resp: clear to auscultation bilaterally Card: RRR,  no murmurs, rubs, or gallops Abd: nontender, nondistended Extremities: no calf tenderness, no edema Vascular: 2+ radial pulses bilaterally, 2+ DP pulses bilaterally Neuro: Cranial nerves intact, equal strength and sensation throughout bilateral upper and lower extremities Skin: no rashes Psyc: acting appropriately   ED Results / Procedures / Treatments   Labs (all labs ordered are listed, but only abnormal results are displayed) Labs Reviewed  COMPREHENSIVE METABOLIC PANEL - Abnormal; Notable for the following components:      Result Value   Glucose, Bld 142 (*)    Calcium 8.7 (*)    GFR, Estimated 60 (*)    All other components within normal limits   SALICYLATE LEVEL - Abnormal; Notable for the following components:   Salicylate Lvl <7.0 (*)    All other components within normal limits  CBC    EKG None  Radiology CT Temporal Bones Wo Contrast Result Date: 02/05/2024 CLINICAL DATA:  Nonpulsatile tinnitus EXAM: CT TEMPORAL BONES WITHOUT CONTRAST TECHNIQUE: Axial and coronal plane CT imaging of the petrous temporal bones was performed with thin-collimation image reconstruction. No intravenous contrast was administered. Multiplanar CT image reconstructions were also generated. RADIATION DOSE REDUCTION: This exam was performed according to the departmental dose-optimization program which includes automated exposure control, adjustment of the mA and/or kV according to patient size and/or use of iterative reconstruction technique. COMPARISON:  None Available. FINDINGS: RIGHT TEMPORAL BONE External auditory canal: Normal. Middle ear cavity: Normally aerated. The scutum and ossicles are normal. The tegmen tympani is intact. Inner ear structures: The cochlea, vestibule and semicircular canals are normal. The vestibular aqueduct is not enlarged. Internal auditory and facial nerve canals:  Normal Mastoid air cells: Normally aerated. No osseous erosion. LEFT TEMPORAL BONE External auditory canal: Normal. Middle ear cavity: Normally aerated. The scutum and ossicles are normal. The tegmen tympani is intact. Inner ear structures: The cochlea, vestibule and semicircular canals are normal. The vestibular aqueduct is not enlarged. Internal auditory and facial nerve canals:  Normal. Mastoid air cells: Normally aerated. No osseous erosion. Vascular: Normal non-contrast appearance of the carotid canals, jugular bulbs and sigmoid plates. Limited intracranial:  No acute or significant finding. Visible orbits/paranasal sinuses: No acute or significant finding. Soft tissues: Normal. IMPRESSION: Normal CT of the temporal bones. Electronically Signed   By: Deatra Robinson M.D.    On: 02/05/2024 21:13    Procedures Procedures    Medications Ordered in ED Medications - No data to display  ED Course/ Medical Decision Making/ A&P                                 Medical Decision Making 83 year old female with past medical history of diabetes and hypertension presents the emergency department today with tinnitus that is acutely worse over the past week.  This has been going on now for quite some time.  The patient's labs ordered from triage are unremarkable.  CT scan does not show any concerning findings that was ordered at triage as well.  The patient does have adequate follow-up with ENT on Friday for reevaluation.  She is not on any medications that should cause this.  Salicylate level is negative here.  I think she is stable for discharge.           Final Clinical Impression(s) / ED Diagnoses Final diagnoses:  Tinnitus, unspecified laterality    Rx / DC Orders ED Discharge Orders     None  Durwin Glaze, MD 02/06/24 [phone]

## 2024-02-11 ENCOUNTER — Emergency Department (HOSPITAL_COMMUNITY)
Admission: EM | Admit: 2024-02-11 | Discharge: 2024-02-11 | Disposition: A | Discharge status: Home / Self Care | Attending: Emergency Medicine | Admitting: Emergency Medicine

## 2024-02-11 ENCOUNTER — Encounter (HOSPITAL_COMMUNITY): Payer: Self-pay | Admitting: *Deleted

## 2024-02-11 ENCOUNTER — Other Ambulatory Visit: Payer: Self-pay

## 2024-02-11 DIAGNOSIS — E119 Type 2 diabetes mellitus without complications: Secondary | ICD-10-CM | POA: Diagnosis not present

## 2024-02-11 DIAGNOSIS — W44G1XA Audio device entering into or through a natural orifice, initial encounter: Secondary | ICD-10-CM | POA: Insufficient documentation

## 2024-02-11 DIAGNOSIS — I10 Essential (primary) hypertension: Secondary | ICD-10-CM | POA: Insufficient documentation

## 2024-02-11 DIAGNOSIS — T161XXA Foreign body in right ear, initial encounter: Secondary | ICD-10-CM | POA: Diagnosis not present

## 2024-02-11 DIAGNOSIS — Z79899 Other long term (current) drug therapy: Secondary | ICD-10-CM | POA: Diagnosis not present

## 2024-02-11 DIAGNOSIS — Z7984 Long term (current) use of oral hypoglycemic drugs: Secondary | ICD-10-CM | POA: Insufficient documentation

## 2024-02-11 NOTE — ED Provider Notes (Signed)
 Allison Franco EMERGENCY DEPARTMENT AT [facility] Provider Note   CSN: [id number] Arrival date & time: 02/11/24  2005     History  Chief Complaint  Patient presents with   Foreign Body in Ear    Allison Franco is a 83 y.o. female who states she has something lodged in her right ear.  She took out her hearing aid this evening and there is still a piece of it stuck in her right ear.  She is now afraid to take out her left hearing aid for fear of it getting stuck as well.  She reports no pain, drainage.  PMHx of hypertension and diabetes    Foreign Body in Ear       Home Medications Prior to Admission medications   Medication Sig Start Date End Date Taking? Authorizing Provider  acetaminophen (TYLENOL) 500 MG tablet Take 1 tablet (500 mg total) by mouth every 4 (four) hours as needed. 02/21/23   Rising, Lurena Joiner, PA-C  albuterol (VENTOLIN HFA) 108 (90 Base) MCG/ACT inhaler Inhale 1-2 puffs into the lungs every 6 (six) hours as needed for wheezing or shortness of breath. 10/17/23   Barbette Merino, NP  Alcohol Swabs (ALCOHOL WIPES) 70 % PADS by Does not apply route. Use with blood sugar check and injection of insulin    [provider]  cetirizine (ZYRTEC) 10 MG tablet Take 1/2 tablet (5 mg total) by mouth daily. 01/30/24   Ellender Hose, NP  Cholecalciferol (VITAMIN D3) 25 MCG (1000 UT) CAPS Take 1 capsule (1,000 Units total) by mouth daily. 06/02/21   Arnette Felts, FNP  dorzolamide-timolol (COSOPT) 2-0.5 % ophthalmic solution Instill 1 drop into both eyes twice a day 01/25/23     dorzolamide-timolol (COSOPT) 2-0.5 % ophthalmic solution Instill 1 drop into both eyes twice a day 05/05/23     glucose blood (ACCU-CHEK GUIDE) test strip TEST 3 TIMES A DAY 08/29/22   Arnette Felts, FNP  MELATONIN PO Take by mouth.    [provider]  meloxicam (MOBIC) 7.5 MG tablet Take 1 tablet (7.5 mg total) by mouth once daily with a  meal Patient not taking: Reported on 01/03/2024 05/04/23    Arnette Felts, FNP  metFORMIN (GLUCOPHAGE) 500 MG tablet Take 1 tablet (500 mg total) by mouth daily with breakfast. 10/24/23   Arnette Felts, FNP  Multiple Vitamins-Minerals (CENTRUM SILVER 50+WOMEN) TABS Take 1 tablet by mouth daily.    [provider]  olmesartan (BENICAR) 20 MG tablet Take 1 tablet (20 mg total) by mouth in the morning and at bedtime. 10/24/23   Arnette Felts, FNP      Allergies    Aspirin, Hydroxyzine, and Valium [diazepam]    Review of Systems   Review of Systems  Physical Exam Updated Vital Signs BP (!) 157/68   Pulse 74   Temp 98.1 F (36.7 C)   Resp 15   Ht 5\' 4"  (1.626 m)   Wt 86.2 kg   SpO2 95%   BMI 32.62 kg/m  Physical Exam Vitals and nursing note reviewed.  Constitutional:      General: She is not in acute distress.    Appearance: Normal appearance. She is not ill-appearing.  HENT:     Right Ear: External ear normal. A foreign body is present.     Left Ear: External ear normal.     Ears:     Comments: Foreign body present in right mid ear.  Visible on external evaluation.  Appears to be  tip of hearing aid. Neurological:     Mental Status: She is alert.  Psychiatric:        Behavior: Behavior is cooperative.     ED Results / Procedures / Treatments   Labs (all labs ordered are listed, but only abnormal results are displayed) Labs Reviewed - No data to display  EKG None  Radiology No results found.  Procedures .Foreign Body Removal  Date/Time: 02/11/2024 10:05 PM  Performed by: Renella Cunas, MD Authorized by: Pricilla Loveless, MD  Consent: Verbal consent obtained. Risks and benefits: risks, benefits and alternatives were discussed Consent given by: patient Patient understanding: patient states understanding of the procedure being performed Patient consent: the patient's understanding of the procedure matches consent given Relevant documents: relevant documents present and verified Site marked: the operative site was  marked Required items: required blood products, implants, devices, and special equipment available Patient identity confirmed: verbally with patient and arm band Time out: Immediately prior to procedure a "time out" was called to verify the correct patient, procedure, equipment, support staff and site/side marked as required. Body area: ear Location details: right ear Localization method: visualized Removal mechanism: forceps Complexity: simple 1 objects recovered. Objects recovered: Hearing aid tip Post-procedure assessment: foreign body removed Patient tolerance: patient tolerated the procedure well with no immediate complications    Medications Ordered in ED Medications - No data to display  ED Course/ Medical Decision Making/ A&P                               Medical Decision Making  83 year old female with a past medical history of hypertension and diabetes presents to the ED with concern for foreign object lodged in right ear canal.  Object is visible on external evaluation with concern for it being the tip of her right hearing aid.  Object removed as per procedure note.  Whole object was removed.  Patient requested left hearing aid be removed by [place] which was done and was removed as a whole unit.  Afterwards, both ear canals were evaluated with otoscope and were clear to evaluation with normal tympanic membranes.  There is no suspicion for otitis media or otitis externa of bilateral ears.  Patient was advised to follow-up with her hearing aid specialist and to replace the tips of her hearing aids with new tips.  Patient was discharged in hemodynamically stable condition.  Final Clinical Impression(s) / ED Diagnoses Final diagnoses:  Foreign body of right ear, initial encounter    Rx / DC Orders ED Discharge Orders     None      Renella Cunas, PGY 2 Emergency medicine   Renella Cunas, MD 02/11/24 2219    Pricilla Loveless, MD 02/11/24 [phone]

## 2024-02-11 NOTE — ED Notes (Signed)
 ED Provider at bedside.

## 2024-02-11 NOTE — ED Triage Notes (Signed)
 She has what appears to be a hearing aid that she cannot get our of her rt ear  one hour ago

## 2024-02-11 NOTE — Discharge Instructions (Signed)
 You are seen in the ED tonight for a piece in your hearing aid lodged in your right ear.  We have removed this piece from your right ear.  We have confirmed both ears are clear at this time.  Please follow-up with your ear specialist for evaluation of your hearing aids.  You may need new tips applied to the ends of the hearing aids so that they do not continue getting stuck in your ears.

## 2024-02-27 NOTE — Progress Notes (Signed)
 [place], CMA,acting as a Neurosurgeon for Allison Epley, FNP.,have documented all relevant documentation on the behalf of Allison Epley, FNP,as directed by  Allison Epley, FNP while in the presence of Allison Epley, FNP.  Subjective:    Patient ID: Allison Franco , female    DOB: 01-May-1941 , 83 y.o.   MRN: [id number]  Chief Complaint  Patient presents with   Annual Exam    HPI  Patient presents today for HM, Patient reports compliance with medication. Patient denies any chest pain, SOB, or headaches. Patient has no concerns today. She has been to the emergency department for the ringing in her ear.      Past Medical History:  Diagnosis Date   Anxiety    Constipation    Depression    "following husband's death"   Diabetes mellitus without complication (HCC)    Hypertension    KNEE PAIN, BILATERAL 02/13/2009   Qualifier: Diagnosis of  By: Gaylyn Keas FNP, Davene Ernst     Tinnitus      Family History  Problem Relation Age of Onset   Diabetes Mother    Hypertension Father    Breast cancer Neg Hx      Current Outpatient Medications:    acetaminophen (TYLENOL) 500 MG tablet, Take 1 tablet (500 mg total) by mouth every 4 (four) hours as needed., Disp: 30 tablet, Rfl: 0   albuterol (VENTOLIN HFA) 108 (90 Base) MCG/ACT inhaler, Inhale 1-2 puffs into the lungs every 6 (six) hours as needed for wheezing or shortness of breath., Disp: 6.7 g, Rfl: 0   Alcohol Swabs (ALCOHOL WIPES) 70 % PADS, by Does not apply route. Use with blood sugar check and injection of insulin, Disp: , Rfl:    Cholecalciferol (VITAMIN D3) 25 MCG (1000 UT) CAPS, Take 1 capsule (1,000 Units total) by mouth daily., Disp: 60 capsule, Rfl: 1   dorzolamide-timolol (COSOPT) 2-0.5 % ophthalmic solution, Instill 1 drop into both eyes twice a day, Disp: 10 mL, Rfl: 3   dorzolamide-timolol (COSOPT) 2-0.5 % ophthalmic solution, Instill 1 drop into both eyes twice a day, Disp: 10 mL, Rfl: 3   glucose blood (ACCU-CHEK GUIDE) test strip,  TEST 3 TIMES A DAY, Disp: 100 strip, Rfl: 3   MELATONIN PO, Take by mouth., Disp: , Rfl:    metFORMIN (GLUCOPHAGE) 500 MG tablet, Take 1 tablet (500 mg total) by mouth daily with breakfast., Disp: 90 tablet, Rfl: 1   Multiple Vitamins-Minerals (CENTRUM SILVER 50+WOMEN) TABS, Take 1 tablet by mouth daily., Disp: , Rfl:    olmesartan (BENICAR) 20 MG tablet, Take 1 tablet (20 mg total) by mouth in the morning and at bedtime., Disp: 90 tablet, Rfl: 1   cetirizine (ZYRTEC) 10 MG tablet, Take 1 tablet (10 mg total) by mouth daily., Disp: 100 tablet, Rfl: 1   meloxicam (MOBIC) 7.5 MG tablet, Take 1 tablet (7.5 mg total) by mouth once daily with a  meal (Patient not taking: Reported on 02/28/2024), Disp: 30 tablet, Rfl: 1   Allergies  Allergen Reactions   Aspirin Other (See Comments)    insomnia   Hydroxyzine Other (See Comments)    insomnia   Valium [Diazepam] Other (See Comments)    Insomnia, 05/10/16 pt states she took as young person- made her depressed      No LMP recorded. Patient has had a hysterectomy.   Negative for Dysmenorrhea and Negative for Menorrhagia. Negative for: breast discharge, breast lump(s), breast pain and breast self exam. Associated symptoms include abnormal  vaginal bleeding. Pertinent negatives include abnormal bleeding (hematology), anxiety, decreased libido, depression, difficulty falling sleep, dyspareunia, history of infertility, nocturia, sexual dysfunction, sleep disturbances, urinary incontinence, urinary urgency, vaginal discharge and vaginal itching. Diet regular; mostly healthy.  The patient states her exercise level is moderate with walking.   The patient's tobacco use is:  Social History   Tobacco Use  Smoking Status Never  Smokeless Tobacco Never   She has been exposed to passive smoke. The patient's alcohol use is:  Social History   Substance and Sexual Activity  Alcohol Use No    Review of Systems  Constitutional: Negative.   HENT: Negative.     Eyes: Negative.   Respiratory: Negative.    Cardiovascular: Negative.   Gastrointestinal: Negative.   Endocrine: Negative.   Genitourinary: Negative.   Musculoskeletal: Negative.   Skin: Negative.   Allergic/Immunologic: Negative.   Neurological: Negative.   Hematological: Negative.   Psychiatric/Behavioral: Negative.       Today's Vitals   02/28/24 1008  BP: 120/70  Pulse: 98  Temp: 98.3 F (36.8 C)  TempSrc: Oral  Weight: 188 lb 6.4 oz (85.5 kg)  Height: 5\' 4"  (1.626 m)  PainSc: 8   PainLoc: Knee   Body mass index is 32.34 kg/m.  Wt Readings from Last 3 Encounters:  02/28/24 188 lb 6.4 oz (85.5 kg)  02/11/24 190 lb 0.6 oz (86.2 kg)  01/03/24 190 lb (86.2 kg)     Objective:  Physical Exam Vitals and nursing note reviewed.  Constitutional:      General: She is not in acute distress.    Appearance: Normal appearance. She is well-developed. She is obese.  HENT:     Head: Normocephalic and atraumatic.     Right Ear: Hearing, tympanic membrane, ear canal and external ear normal. There is no impacted cerumen.     Left Ear: Hearing, tympanic membrane, ear canal and external ear normal. There is no impacted cerumen.     Nose: Nose normal.     Mouth/Throat:     Mouth: Mucous membranes are moist.  Eyes:     General: Lids are normal.     Extraocular Movements: Extraocular movements intact.     Conjunctiva/sclera: Conjunctivae normal.     Pupils: Pupils are equal, round, and reactive to light.     Funduscopic exam:    Right eye: No papilledema.        Left eye: No papilledema.  Neck:     Thyroid: No thyroid mass.     Vascular: No carotid bruit.  Cardiovascular:     Rate and Rhythm: Normal rate and regular rhythm.     Pulses: Normal pulses.     Heart sounds: Normal heart sounds. No murmur heard. Pulmonary:     Effort: Pulmonary effort is normal. No respiratory distress.     Breath sounds: Normal breath sounds. No wheezing.  Chest:     Chest wall: No mass.   Breasts:    Tanner Score is 5.     Right: Normal. No mass or tenderness.     Left: Normal. No mass or tenderness.  Abdominal:     General: Abdomen is flat. Bowel sounds are normal. There is no distension.     Palpations: Abdomen is soft.     Tenderness: There is no abdominal tenderness.  Genitourinary:    Rectum: Guaiac result negative.  Musculoskeletal:        General: No swelling or tenderness. Normal range of motion.  Cervical back: Full passive range of motion without pain, normal range of motion and neck supple.     Right lower leg: No edema.     Left lower leg: No edema.  Lymphadenopathy:     Upper Body:     Right upper body: No supraclavicular, axillary or pectoral adenopathy.     Left upper body: No supraclavicular, axillary or pectoral adenopathy.  Skin:    General: Skin is warm and dry.     Capillary Refill: Capillary refill takes less than 2 seconds.  Neurological:     General: No focal deficit present.     Mental Status: She is alert and oriented to person, place, and time.     Cranial Nerves: No cranial nerve deficit.     Sensory: No sensory deficit.     Motor: No weakness.  Psychiatric:        Mood and Affect: Mood normal.        Behavior: Behavior normal.        Thought Content: Thought content normal.        Judgment: Judgment normal.      Title   Diabetic Foot Exam - detailed Date & Time: 02/28/2024 10:54 AM Diabetic Foot exam was performed with the following findings: Yes  Visual Foot Exam completed.: Yes   Pulse Foot Exam completed.: Yes      Sensory Foot Exam Completed.: Yes Semmes-Weinstein Monofilament Test "+" means "has sensation" and "-" means "no sensation"      Image components are not supported.   Image components are not supported. Image components are not supported.  [place] Comments        Assessment And Plan:     Encounter for annual health examination Assessment & Plan: Behavior modifications discussed and diet  history reviewed.   Pt will continue to exercise regularly and modify diet with low GI, plant based foods and decrease intake of processed foods.  Recommend intake of daily multivitamin, Vitamin D, and calcium.  Recommend mammogram and colonoscopy for preventive screenings, as well as recommend immunizations that include influenza, TDAP, and Shingles    Type 2 diabetes mellitus with hyperlipidemia (HCC) Assessment & Plan: HgbA1c is stable. Continue current medications  Orders: -     EKG 12-Lead -     POCT URINALYSIS DIP (CLINITEK) -     Microalbumin / creatinine urine ratio -     CMP14+EGFR -     Hemoglobin A1c -     Lipid panel  Hypertensive heart disease without heart failure Assessment & Plan: Blood pressure is controlled, continue focusing on healthy lifestyle changes   Seasonal allergies -     Cetirizine HCl; Take 1 tablet (10 mg total) by mouth daily.  Dispense: 100 tablet; Refill: 1  Tinnitus of both ears Assessment & Plan: Advised to avoid going to ER for her chronic ringing in her ear, she is to stay well hydrated with water and if not improved call to office.    Abnormal EKG Assessment & Plan: EKG done and shows Sinus Rhythm -First degree A-V block  PRi = 220  -Right atrial enlargement.  -Poor R-wave progression -may be secondary to pulmonary disease  consider old anterior infarct.  Right atrial abnormality and rotation -possible pulmonary disease.   This is different from her previous, will refer to Cardiology for further evaluation  Orders: -     Ambulatory referral to Cardiology  Other long term (current) drug therapy -     CBC  with Differential/Platelet     No follow-ups on file. Patient was given opportunity to ask questions. Patient verbalized understanding of the plan and was able to repeat key elements of the plan. All questions were answered to their satisfaction.   Allison Epley, FNP  I, Allison Epley, FNP, have reviewed all documentation for  this visit. The documentation on 02/28/24 for the exam, diagnosis, procedures, and orders are all accurate and complete.

## 2024-02-28 ENCOUNTER — Ambulatory Visit (INDEPENDENT_AMBULATORY_CARE_PROVIDER_SITE_OTHER): Payer: 59 | Admitting: Nurse Practitioner

## 2024-02-28 ENCOUNTER — Encounter: Payer: Self-pay | Admitting: Nurse Practitioner

## 2024-02-28 ENCOUNTER — Other Ambulatory Visit (HOSPITAL_COMMUNITY): Payer: Self-pay

## 2024-02-28 VITALS — BP 120/70 | HR 98 | Temp 98.3°F | Ht 64.0 in | Wt 188.4 lb

## 2024-02-28 DIAGNOSIS — H9313 Tinnitus, bilateral: Secondary | ICD-10-CM | POA: Diagnosis not present

## 2024-02-28 DIAGNOSIS — J302 Other seasonal allergic rhinitis: Secondary | ICD-10-CM | POA: Diagnosis not present

## 2024-02-28 DIAGNOSIS — E785 Hyperlipidemia, unspecified: Secondary | ICD-10-CM | POA: Diagnosis not present

## 2024-02-28 DIAGNOSIS — Z0000 Encounter for general adult medical examination without abnormal findings: Secondary | ICD-10-CM

## 2024-02-28 DIAGNOSIS — E1169 Type 2 diabetes mellitus with other specified complication: Secondary | ICD-10-CM

## 2024-02-28 DIAGNOSIS — Z79899 Other long term (current) drug therapy: Secondary | ICD-10-CM | POA: Diagnosis not present

## 2024-02-28 DIAGNOSIS — I119 Hypertensive heart disease without heart failure: Secondary | ICD-10-CM

## 2024-02-28 DIAGNOSIS — R9431 Abnormal electrocardiogram [ECG] [EKG]: Secondary | ICD-10-CM | POA: Diagnosis not present

## 2024-02-28 LAB — POCT URINALYSIS DIP (CLINITEK)
Bilirubin, UA: NEGATIVE
Blood, UA: NEGATIVE
Glucose, UA: NEGATIVE mg/dL
Ketones, POC UA: NEGATIVE mg/dL
Leukocytes, UA: NEGATIVE
Nitrite, UA: NEGATIVE
POC PROTEIN,UA: NEGATIVE
Spec Grav, UA: 1.01 (ref 1.010–1.025)
Urobilinogen, UA: 0.2 U/dL
pH, UA: 7 (ref 5.0–8.0)

## 2024-02-28 MED ORDER — CETIRIZINE HCL 10 MG PO TABS
10.0000 mg | ORAL_TABLET | Freq: Every day | ORAL | 1 refills | Status: DC
Start: 2024-02-28 — End: 2024-05-04
  Filled 2024-02-28: qty 100, 100d supply, fill #0
  Filled 2024-02-28: qty 90, 90d supply, fill #0
  Filled 2024-03-11: qty 100, 100d supply, fill #0

## 2024-02-29 ENCOUNTER — Encounter: Payer: Self-pay | Admitting: Nurse Practitioner

## 2024-02-29 LAB — CMP14+EGFR
ALT: 23 IU/L (ref 0–32)
AST: 24 IU/L (ref 0–40)
Albumin: 4.1 g/dL (ref 3.7–4.7)
Alkaline Phosphatase: 101 IU/L (ref 44–121)
BUN/Creatinine Ratio: 20 (ref 12–28)
BUN: 17 mg/dL (ref 8–27)
Bilirubin Total: 0.5 mg/dL (ref 0.0–1.2)
CO2: 24 mmol/L (ref 20–29)
Calcium: 9.1 mg/dL (ref 8.7–10.3)
Chloride: 98 mmol/L (ref 96–106)
Creatinine, Ser: 0.84 mg/dL (ref 0.57–1.00)
Globulin, Total: 2.7 g/dL (ref 1.5–4.5)
Glucose: 87 mg/dL (ref 70–99)
Potassium: 4.2 mmol/L (ref 3.5–5.2)
Sodium: 135 mmol/L (ref 134–144)
Total Protein: 6.8 g/dL (ref 6.0–8.5)
eGFR: 69 mL/min/{1.73_m2} (ref >59)

## 2024-02-29 LAB — MICROALBUMIN / CREATININE URINE RATIO
Creatinine, Urine: 23.4 mg/dL
Microalb/Creat Ratio: <13 mg/g{creat} (ref 0–29)
Microalbumin, Urine: <3 ug/mL

## 2024-02-29 LAB — CBC WITH DIFFERENTIAL/PLATELET
Basophils Absolute: 0 10*3/uL (ref 0.0–0.2)
Basos: 1 %
EOS (ABSOLUTE): 0.1 10*3/uL (ref 0.0–0.4)
Eos: 2 %
Hematocrit: 39.2 % (ref 34.0–46.6)
Hemoglobin: 12.6 g/dL (ref 11.1–15.9)
Immature Grans (Abs): 0 10*3/uL (ref 0.0–0.1)
Immature Granulocytes: 0 %
Lymphocytes Absolute: 1.5 10*3/uL (ref 0.7–3.1)
Lymphs: 29 %
MCH: 28.3 pg (ref 26.6–33.0)
MCHC: 32.1 g/dL (ref 31.5–35.7)
MCV: 88 fL (ref 79–97)
Monocytes Absolute: 0.6 10*3/uL (ref 0.1–0.9)
Monocytes: 11 %
Neutrophils Absolute: 2.9 10*3/uL (ref 1.4–7.0)
Neutrophils: 57 %
Platelets: 249 10*3/uL (ref 150–450)
RBC: 4.45 x10E6/uL (ref 3.77–5.28)
RDW: 11.9 % (ref 11.7–15.4)
WBC: 5.1 10*3/uL (ref 3.4–10.8)

## 2024-02-29 LAB — LIPID PANEL
Chol/HDL Ratio: 4 ratio (ref 0.0–4.4)
Cholesterol, Total: 183 mg/dL (ref 100–199)
HDL: 46 mg/dL (ref >39)
LDL Chol Calc (NIH): 113 mg/dL — ABNORMAL HIGH (ref 0–99)
Triglycerides: 132 mg/dL (ref 0–149)
VLDL Cholesterol Cal: 24 mg/dL (ref 5–40)

## 2024-02-29 LAB — HEMOGLOBIN A1C
Est. average glucose Bld gHb Est-mCnc: 123 mg/dL
Hgb A1c MFr Bld: 5.9 % — ABNORMAL HIGH (ref 4.8–5.6)

## 2024-03-11 ENCOUNTER — Other Ambulatory Visit (HOSPITAL_COMMUNITY): Payer: Self-pay

## 2024-03-11 DIAGNOSIS — Z79899 Other long term (current) drug therapy: Secondary | ICD-10-CM | POA: Insufficient documentation

## 2024-03-11 DIAGNOSIS — R9431 Abnormal electrocardiogram [ECG] [EKG]: Secondary | ICD-10-CM | POA: Insufficient documentation

## 2024-03-11 DIAGNOSIS — Z0000 Encounter for general adult medical examination without abnormal findings: Secondary | ICD-10-CM | POA: Insufficient documentation

## 2024-03-11 NOTE — Assessment & Plan Note (Signed)
 HgbA1c is stable. Continue current medications

## 2024-03-11 NOTE — Assessment & Plan Note (Signed)

## 2024-03-11 NOTE — Assessment & Plan Note (Signed)
 Advised to avoid going to ER for her chronic ringing in her ear, she is to stay well hydrated with water and if not improved call to office.

## 2024-03-11 NOTE — Assessment & Plan Note (Signed)
 EKG done and shows Sinus Rhythm -First degree A-V block  PRi = 220  -Right atrial enlargement.  -Poor R-wave progression -may be secondary to pulmonary disease  consider old anterior infarct.  Right atrial abnormality and rotation -possible pulmonary disease.   This is different from her previous, will refer to Cardiology for further evaluation

## 2024-03-11 NOTE — Assessment & Plan Note (Signed)
 Blood pressure is controlled, continue focusing on healthy lifestyle changes

## 2024-03-18 ENCOUNTER — Ambulatory Visit (HOSPITAL_COMMUNITY): Admission: EM | Admit: 2024-03-18 | Discharge: 2024-03-18 | Disposition: A | Discharge status: Home / Self Care

## 2024-03-18 ENCOUNTER — Other Ambulatory Visit (HOSPITAL_COMMUNITY): Payer: Self-pay

## 2024-03-18 ENCOUNTER — Encounter (HOSPITAL_COMMUNITY): Payer: Self-pay

## 2024-03-18 ENCOUNTER — Ambulatory Visit (INDEPENDENT_AMBULATORY_CARE_PROVIDER_SITE_OTHER)

## 2024-03-18 DIAGNOSIS — M25561 Pain in right knee: Secondary | ICD-10-CM | POA: Diagnosis not present

## 2024-03-18 DIAGNOSIS — G8929 Other chronic pain: Secondary | ICD-10-CM | POA: Diagnosis not present

## 2024-03-18 DIAGNOSIS — M25562 Pain in left knee: Secondary | ICD-10-CM | POA: Diagnosis not present

## 2024-03-18 DIAGNOSIS — M1712 Unilateral primary osteoarthritis, left knee: Secondary | ICD-10-CM | POA: Diagnosis not present

## 2024-03-18 DIAGNOSIS — M1711 Unilateral primary osteoarthritis, right knee: Secondary | ICD-10-CM | POA: Diagnosis not present

## 2024-03-18 DIAGNOSIS — M25461 Effusion, right knee: Secondary | ICD-10-CM | POA: Diagnosis not present

## 2024-03-18 DIAGNOSIS — H9313 Tinnitus, bilateral: Secondary | ICD-10-CM

## 2024-03-18 MED ORDER — DICLOFENAC SODIUM 50 MG PO TBEC
50.0000 mg | DELAYED_RELEASE_TABLET | Freq: Two times a day (BID) | ORAL | 2 refills | Status: DC
Start: 2024-03-18 — End: 2024-05-04
  Filled 2024-03-18 (×2): qty 30, 15d supply, fill #0

## 2024-03-18 NOTE — Discharge Instructions (Signed)
 1. Bilateral chronic knee pain (Primary) - DG Knee Complete 4 Views Left shows no acute fracture or dislocation, moderate degenerative changes consistent with osteoarthritis - DG Knee Complete 4 Views Right shows no acute fracture or dislocation, moderate degenerative changes consistent with osteoarthritis - diclofenac  (VOLTAREN ) 50 MG EC tablet; Take 1 tablet (50 mg total) by mouth 2 (two) times daily.  Dispense: 30 tablet; Refill: 2 - AMB referral to sports medicine for follow-up evaluation of bilateral knee pain/osteoarthritis, possible need for joint injection for pain relief.  2. Tinnitus, bilateral - Ambulatory referral to Audiology for follow-up evaluation of ongoing tinnitus.

## 2024-03-18 NOTE — ED Provider Notes (Signed)
 [facility]  Note:  This document was prepared using Dragon voice recognition software and may include unintentional dictation errors.  MRN: [id number] DOB: Apr 10, 1941  Subjective:   Allison Franco is a 83 y.o. female presenting for bilateral tinnitus x 2 to 3 years and bilateral knee pain x 1 year.  Patient reports that she has seen multiple doctors for bilateral ear tinnitus and no one has been able to resolve her symptoms.  Patient states that tinnitus is much worse at night when trying to sleep.  Patient also reports that bilateral knee pain has been ongoing for approximately a year has been taking Tylenol  with no improvement.  Patient was seen by orthopedics previously and knee injection was performed in the left knee which did seem to help symptoms.  No current facility-administered medications for this encounter.  Current Outpatient Medications:    diclofenac  (VOLTAREN ) 50 MG EC tablet, Take 1 tablet (50 mg total) by mouth 2 (two) times daily., Disp: 30 tablet, Rfl: 2   acetaminophen  (TYLENOL ) 500 MG tablet, Take 1 tablet (500 mg total) by mouth every 4 (four) hours as needed., Disp: 30 tablet, Rfl: 0   albuterol  (VENTOLIN  HFA) 108 (90 Base) MCG/ACT inhaler, Inhale 1-2 puffs into the lungs every 6 (six) hours as needed for wheezing or shortness of breath., Disp: 6.7 g, Rfl: 0   Alcohol Swabs (ALCOHOL WIPES) 70 % PADS, by Does not apply route. Use with blood sugar check and injection of insulin , Disp: , Rfl:    cetirizine  (ZYRTEC ) 10 MG tablet, Take 1 tablet (10 mg total) by mouth daily., Disp: 100 tablet, Rfl: 1   Cholecalciferol  (VITAMIN D3) 25 MCG (1000 UT) CAPS, Take 1 capsule (1,000 Units total) by mouth daily., Disp: 60 capsule, Rfl: 1   dorzolamide -timolol  (COSOPT ) 2-0.5 % ophthalmic solution, Instill 1 drop into both eyes twice a day, Disp: 10 mL, Rfl: 3   dorzolamide -timolol  (COSOPT ) 2-0.5 % ophthalmic solution, Instill 1 drop into both eyes twice a day, Disp: 10 mL,  Rfl: 3   glucose blood (ACCU-CHEK GUIDE) test strip, TEST 3 TIMES A DAY, Disp: 100 strip, Rfl: 3   MELATONIN PO, Take by mouth., Disp: , Rfl:    metFORMIN  (GLUCOPHAGE ) 500 MG tablet, Take 1 tablet (500 mg total) by mouth daily with breakfast., Disp: 90 tablet, Rfl: 1   Multiple Vitamins-Minerals (CENTRUM SILVER 50+WOMEN) TABS, Take 1 tablet by mouth daily., Disp: , Rfl:    olmesartan  (BENICAR ) 20 MG tablet, Take 1 tablet (20 mg total) by mouth in the morning and at bedtime., Disp: 90 tablet, Rfl: 1   Allergies  Allergen Reactions   Aspirin  Other (See Comments)    insomnia   Hydroxyzine  Other (See Comments)    insomnia   Valium  [Diazepam ] Other (See Comments)    Insomnia, 05/10/16 pt states she took as young person- made her depressed    Past Medical History:  Diagnosis Date   Anxiety    Constipation    Depression    "following husband's death"   Diabetes mellitus without complication (HCC)    Hypertension    KNEE PAIN, BILATERAL 02/13/2009   Qualifier: Diagnosis of  By: Gaylyn Keas FNP, Davene Ernst     Tinnitus      Past Surgical History:  Procedure Laterality Date   ABDOMINAL HYSTERECTOMY     COLON RESECTION N/A 08/07/2019   Procedure: DIAGNOSTIC LAPAROSCOPY WITH OPEN LEFT INGUINAL HERNIA WITH MESH;  Surgeon: Jacolyn Matar, MD;  Location: [facility];  Service: General;  Laterality: N/A;   fribroid surgery      Family History  Problem Relation Age of Onset   Diabetes Mother    Hypertension Father    Breast cancer Neg Hx     Social History   Tobacco Use   Smoking status: Never   Smokeless tobacco: Never  Vaping Use   Vaping status: Never Used  Substance Use Topics   Alcohol use: No   Drug use: No    ROS Refer to HPI for ROS details.  Objective:   Vitals: BP 135/77 (BP Location: Right Arm)   Pulse 72   Temp 98.1 F (36.7 C) (Oral)   Resp 16   Ht 5\' 3"  (1.6 m)   Wt 175 lb (79.4 kg)   SpO2 95%   BMI 31.00 kg/m   Physical Exam Vitals and nursing note reviewed.   Constitutional:      General: She is not in acute distress.    Appearance: Normal appearance. She is well-developed. She is not ill-appearing or toxic-appearing.  HENT:     Head: Normocephalic and atraumatic.     Right Ear: Tympanic membrane, ear canal and external ear normal. There is no impacted cerumen.     Left Ear: Tympanic membrane, ear canal and external ear normal. There is no impacted cerumen.  Cardiovascular:     Rate and Rhythm: Normal rate.  Pulmonary:     Effort: Pulmonary effort is normal. No respiratory distress.  Musculoskeletal:     Right knee: Bony tenderness present. No swelling, deformity, erythema or crepitus. Decreased range of motion. Tenderness present. Normal pulse.     Left knee: Bony tenderness present. No swelling, deformity, erythema or crepitus. Decreased range of motion. Tenderness present. Normal pulse.  Skin:    General: Skin is warm and dry.  Neurological:     General: No focal deficit present.     Mental Status: She is alert and oriented to person, place, and time.  Psychiatric:        Mood and Affect: Mood normal.        Behavior: Behavior normal.     Procedures  No results found for this or any previous visit (from the past 24 hours).  No results found.   Assessment and Plan :   Discharge Instructions   None     Derril Flint B, [place] 03/18/24 1316

## 2024-03-18 NOTE — ED Triage Notes (Signed)
 Patient here today with c/o ringing in her ears X 2-3 years.   Patient is also having bilat knee pain X 1 year. No known injury. She has been taking Tylenol  with no relief.

## 2024-03-22 ENCOUNTER — Encounter (HOSPITAL_COMMUNITY): Payer: Self-pay

## 2024-03-27 ENCOUNTER — Ambulatory Visit (INDEPENDENT_AMBULATORY_CARE_PROVIDER_SITE_OTHER): Admitting: Family Medicine

## 2024-03-27 ENCOUNTER — Encounter: Payer: Self-pay | Admitting: Family Medicine

## 2024-03-27 VITALS — BP 152/67 | Ht 63.0 in | Wt 180.0 lb

## 2024-03-27 DIAGNOSIS — M170 Bilateral primary osteoarthritis of knee: Secondary | ICD-10-CM | POA: Diagnosis not present

## 2024-03-27 NOTE — Patient Instructions (Signed)
 Your pain is due to arthritis. These are the different medications you can take for this: Tylenol  500mg  1-2 tabs three times a day for pain. Voltaren  gel, capsaicin, aspercreme, or biofreeze topically up to four times a day may also help with pain. Some supplements that may help for arthritis: Boswellia extract, curcumin, pycnogenol Aleve  1-2 tabs twice a day with food Cortisone injections are an option if pain worsens. It's important that you continue to stay active. Straight leg raises, knee extensions 3 sets of 10 once a day (add ankle weight if these become too easy). Consider physical therapy to strengthen muscles around the joint that hurts to take pressure off of the joint itself. Heat or ice 15 minutes at a time 3-4 times a day as needed to help with pain. Follow up with me in 1 month.

## 2024-03-27 NOTE — Progress Notes (Signed)
 PCP: [name], FNP  Subjective:   HPI: Patient is a 83 y.o. female here for bilateral knee pain.  Patient reports she's had bilateral knee pain for a year. No injury or trauma. Pain was pretty severe before she went to urgent care on 4/21 - had x-rays showing moderate-severe arthritis. She takes tylenol  as needed. Pain improved since that visit. Has had injections remotely which helped - last ones about a year ago she thinks. Pain worse when she's lying down in bed.  Past Medical History:  Diagnosis Date   Anxiety    Constipation    Depression    "following husband's death"   Diabetes mellitus without complication (HCC)    Hypertension    KNEE PAIN, BILATERAL 02/13/2009   Qualifier: Diagnosis of  By: Gaylyn Keas FNP, [place]     Tinnitus     Current Outpatient Medications on File Prior to Visit  Medication Sig Dispense Refill   acetaminophen  (TYLENOL ) 500 MG tablet Take 1 tablet (500 mg total) by mouth every 4 (four) hours as needed. 30 tablet 0   albuterol  (VENTOLIN  HFA) 108 (90 Base) MCG/ACT inhaler Inhale 1-2 puffs into the lungs every 6 (six) hours as needed for wheezing or shortness of breath. 6.7 g 0   Alcohol Swabs (ALCOHOL WIPES) 70 % PADS by Does not apply route. Use with blood sugar check and injection of insulin      cetirizine  (ZYRTEC ) 10 MG tablet Take 1 tablet (10 mg total) by mouth daily. 100 tablet 1   Cholecalciferol  (VITAMIN D3) 25 MCG (1000 UT) CAPS Take 1 capsule (1,000 Units total) by mouth daily. 60 capsule 1   diclofenac  (VOLTAREN ) 50 MG EC tablet Take 1 tablet (50 mg total) by mouth 2 (two) times daily. 30 tablet 2   dorzolamide -timolol  (COSOPT ) 2-0.5 % ophthalmic solution Instill 1 drop into both eyes twice a day 10 mL 3   dorzolamide -timolol  (COSOPT ) 2-0.5 % ophthalmic solution Instill 1 drop into both eyes twice a day 10 mL 3   glucose blood (ACCU-CHEK GUIDE) test strip TEST 3 TIMES A DAY 100 strip 3   MELATONIN PO Take by mouth.     metFORMIN   (GLUCOPHAGE ) 500 MG tablet Take 1 tablet (500 mg total) by mouth daily with breakfast. 90 tablet 1   Multiple Vitamins-Minerals (CENTRUM SILVER 50+WOMEN) TABS Take 1 tablet by mouth daily.     olmesartan  (BENICAR ) 20 MG tablet Take 1 tablet (20 mg total) by mouth in the morning and at bedtime. 90 tablet 1   No current facility-administered medications on file prior to visit.    Past Surgical History:  Procedure Laterality Date   ABDOMINAL HYSTERECTOMY     COLON RESECTION N/A 08/07/2019   Procedure: DIAGNOSTIC LAPAROSCOPY WITH OPEN LEFT INGUINAL HERNIA WITH MESH;  Surgeon: Jacolyn Matar, MD;  Location: [facility];  Service: General;  Laterality: N/A;   fribroid surgery      Allergies  Allergen Reactions   Aspirin  Other (See Comments)    insomnia   Hydroxyzine  Other (See Comments)    insomnia   Valium  [Diazepam ] Other (See Comments)    Insomnia, 05/10/16 pt states she took as young person- made her depressed    BP (!) 152/67   Ht 5\' 3"  (1.6 m)   Wt 180 lb (81.6 kg)   BMI 31.89 kg/m       No data to display              No data to display  Objective:  Physical Exam:  Gen: NAD, comfortable in exam room  Bilateral knees: Crepitus bilaterally.  Mild effusions.  No other gross deformity, ecchymoses Mild TTP medial joint lines.  No other tenderness. ROM 0- 110 degrees with normal strength. Negative ant/post drawers. Negative valgus/varus testing. Negative lachman.  Negative mcmurrays, apleys.  NV intact distally.   Assessment & Plan:  1. Bilateral knee pain - 2/2 moderate to severe arthritis.  Improved over past week.  Tylenol , topical medications, supplements, aleve  reviewed.  Home exercises provided as well.  Heat/ice as needed.  Follow up in 1 month.

## 2024-04-03 ENCOUNTER — Ambulatory Visit (HOSPITAL_COMMUNITY)
Admission: EM | Admit: 2024-04-03 | Discharge: 2024-04-03 | Disposition: A | Discharge status: Home / Self Care | Attending: Family Medicine | Admitting: Family Medicine

## 2024-04-03 ENCOUNTER — Encounter (HOSPITAL_COMMUNITY): Payer: Self-pay | Admitting: Emergency Medicine

## 2024-04-03 ENCOUNTER — Other Ambulatory Visit: Payer: Self-pay

## 2024-04-03 DIAGNOSIS — R42 Dizziness and giddiness: Secondary | ICD-10-CM | POA: Diagnosis not present

## 2024-04-03 LAB — BASIC METABOLIC PANEL WITH GFR
Anion gap: 9 (ref 5–15)
BUN: 21 mg/dL (ref 8–23)
CO2: 26 mmol/L (ref 22–32)
Calcium: 9.3 mg/dL (ref 8.9–10.3)
Chloride: 98 mmol/L (ref 98–111)
Creatinine, Ser: 0.91 mg/dL (ref 0.44–1.00)
GFR, Estimated: >60 mL/min
Glucose, Bld: 94 mg/dL (ref 70–99)
Potassium: 4.3 mmol/L (ref 3.5–5.1)
Sodium: 133 mmol/L — ABNORMAL LOW (ref 135–145)

## 2024-04-03 LAB — CBC
HCT: 41.2 % (ref 36.0–46.0)
Hemoglobin: 13 g/dL (ref 12.0–15.0)
MCH: 28.3 pg (ref 26.0–34.0)
MCHC: 31.6 g/dL (ref 30.0–36.0)
MCV: 89.8 fL (ref 80.0–100.0)
Platelets: 262 K/uL (ref 150–400)
RBC: 4.59 MIL/uL (ref 3.87–5.11)
RDW: 12.1 % (ref 11.5–15.5)
WBC: 6.4 K/uL (ref 4.0–10.5)
nRBC: 0 % (ref 0.0–0.2)

## 2024-04-03 LAB — POCT FASTING CBG KUC MANUAL ENTRY: POCT Glucose (KUC): 102 mg/dL — AB (ref 70–99)

## 2024-04-03 MED ORDER — MECLIZINE HCL 25 MG PO TABS: 25.0000 mg | ORAL_TABLET | Freq: Three times a day (TID) | ORAL | 0 refills | Status: DC | PRN

## 2024-04-03 NOTE — Discharge Instructions (Signed)
 Your sugar was 102.  We have drawn blood to check your blood counts and electrolytes.  Our staff will notify you if anything is significantly abnormal.  Take meclizine  25 mg--1 tablet every 8 hours as needed for vertigo  Please follow-up with your primary care about this issue

## 2024-04-03 NOTE — ED Triage Notes (Signed)
 Patient woke during the night feeling dizzy.  Today, the feeling has continued.  Patient reports room is spinning.  Denies pain.  Has not taken any medications for symptoms

## 2024-04-03 NOTE — ED Provider Notes (Addendum)
 [facility]    CSN: [id number] Arrival date & time: 04/03/24  1604      History   Chief Complaint No chief complaint on file.   HPI Allison Franco is a 83 y.o. female.   HPI Here for dizziness that has been a spinning sensation.  It began last night and is exacerbated by turning her head or rolling over in the bed.  No headache and no fever or cough or congestion.  No ear pain.  No nausea or vomiting and no blurry vision.  She does have a history of diabetes but has not checked her sugar today.  She does note of prolonged history of ringing in her ears.  She does not really note any hearing loss.   Past Medical History:  Diagnosis Date   Anxiety    Constipation    Depression    "following husband's death"   Diabetes mellitus without complication (HCC)    Hypertension    KNEE PAIN, BILATERAL 02/13/2009   Qualifier: Diagnosis of  By: Gaylyn Keas FNP, Davene Ernst     Tinnitus     Patient Active Problem List   Diagnosis Date Noted   Encounter for annual health examination 03/11/2024   Abnormal EKG 03/11/2024   Type 2 diabetes mellitus with hyperlipidemia (HCC) 10/24/2023   Need for influenza vaccination 10/24/2023   COVID-19 vaccine administered 10/24/2023   Seasonal allergies 07/29/2023   Dysfunction of Eustachian tube, bilateral 07/29/2023   Obesity (BMI 30.0-34.9) 06/20/2023   Primary open angle glaucoma of both eyes, severe stage 06/20/2023   Facial rash 06/20/2023   Aortic atherosclerosis (HCC) 02/23/2023   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/23/2023   Constipation 02/14/2023   Unilateral primary osteoarthritis, left knee 05/18/2022   Inguinal hernia with bowel obstruction 08/07/2019   Incarcerated left inguinal hernia 08/07/2019   Sensorineural hearing loss (SNHL) of right ear with restricted hearing of left ear 05/21/2019   Tinnitus of both ears 05/21/2019   Anemia 08/31/2018   Type 2 diabetes mellitus without complication, without  long-term current use of insulin  (HCC) 07/02/2018   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      Home Medications    Prior to Admission medications   Medication Sig Start Date End Date Taking? Authorizing Provider  meclizine  (ANTIVERT ) 25 MG tablet Take 1 tablet (25 mg total) by mouth 3 (three) times daily as needed for dizziness (vertigo). 04/03/24  Yes Ann Keto, MD  acetaminophen  (TYLENOL ) 500 MG tablet Take 1 tablet (500 mg total) by mouth every 4 (four) hours as needed. 02/21/23   Rising, Ivette Marks, PA-C  albuterol  (VENTOLIN  HFA) 108 (90 Base) MCG/ACT inhaler Inhale 1-2 puffs into the lungs every 6 (six) hours as needed for wheezing or shortness of breath. 10/17/23   Gregoria Leas, NP  Alcohol Swabs (ALCOHOL WIPES) 70 % PADS by Does not apply route. Use with blood sugar check and injection of insulin   [provider]  cetirizine  (ZYRTEC ) 10 MG tablet Take 1 tablet (10 mg total) by mouth daily. 02/28/24   [name], FNP  Cholecalciferol  (VITAMIN D3) 25 MCG (1000 UT) CAPS Take 1 capsule (1,000 Units total) by mouth daily. 06/02/21   [name], FNP  diclofenac  (VOLTAREN ) 50 MG EC tablet Take 1 tablet (50 mg total) by mouth 2 (two) times daily. 03/18/24   Reddick, Johnathan B, NP  dorzolamide -timolol  (COSOPT ) 2-0.5 % ophthalmic solution Instill 1 drop into both eyes twice a day 01/25/23     dorzolamide -timolol  (COSOPT ) 2-0.5 % ophthalmic solution Instill 1 drop into both eyes twice a  day 05/05/23     glucose blood (ACCU-CHEK GUIDE) test strip TEST 3 TIMES A DAY 08/29/22   [name], FNP  MELATONIN PO Take by mouth.    [provider]  metFORMIN  (GLUCOPHAGE ) 500 MG tablet Take 1 tablet (500 mg total) by mouth daily with breakfast. 10/24/23   [name], FNP  Multiple Vitamins-Minerals (CENTRUM SILVER 50+WOMEN) TABS Take 1 tablet by mouth daily.    [provider]  olmesartan  (BENICAR ) 20 MG tablet Take 1 tablet (20 mg total) by mouth in the morning and at bedtime. 10/24/23   [name], FNP    Family History Family History  Problem Relation Age of Onset   Diabetes Mother    Hypertension Father    Breast cancer Neg Hx     Social History Social History   Tobacco Use   Smoking status: Never   Smokeless tobacco: Never  Vaping Use   Vaping status: Never Used  Substance Use Topics   Alcohol use: No   Drug use: No     Allergies   Aspirin , Hydroxyzine , and Valium  [diazepam ]   Review of Systems Review of Systems   Physical Exam Triage Vital Signs ED Triage Vitals  Encounter Vitals Group     BP 04/03/24 1721 (!) 147/79     Systolic BP Percentile --      Diastolic BP Percentile --      Pulse Rate 04/03/24 1721 64     Resp 04/03/24 1721 18     Temp 04/03/24 1721 98 F (36.7 C)     Temp Source 04/03/24 1721 Oral     SpO2 04/03/24 1721 93 %     Weight --      Height --      Head Circumference --      Peak Flow --      Pain Score 04/03/24 1719 0     Pain Loc --      Pain Education --      Exclude from Growth Chart --    No data found.  Updated Vital Signs BP (!) 147/79 (BP Location: Right Arm) Comment (BP Location): large cuff  Pulse 64   Temp 98 F (36.7 C) (Oral)   Resp 18   SpO2 93%   Visual Acuity Right Eye Distance:   Left Eye Distance:   Bilateral Distance:    Right Eye Near:   Left Eye Near:    Bilateral Near:     Physical Exam Vitals reviewed.  Constitutional:      General: She is not in acute  distress.    Appearance: She is not ill-appearing, toxic-appearing or diaphoretic.  HENT:     Mouth/Throat:     Mouth: Mucous membranes are moist.     Pharynx: No oropharyngeal exudate.  Eyes:     Conjunctiva/sclera: Conjunctivae normal.  Pupils: Pupils are equal, round, and reactive to light.     Comments: There is some nystagmus on lateral gaze bilaterally.  Cardiovascular:     Rate and Rhythm: Normal rate and regular rhythm.     Heart sounds: No murmur heard. Pulmonary:     Effort: Pulmonary effort is normal.     Breath sounds: Normal breath sounds.  Musculoskeletal:     Cervical back: Neck supple.  Lymphadenopathy:     Cervical: No cervical adenopathy.  Skin:    Coloration: Skin is not jaundiced or pale.  Neurological:     General: No focal deficit present.     Mental Status: She is oriented to person, place, and time.  Psychiatric:        Behavior: Behavior normal.      UC Treatments / Results  Labs (all labs ordered are listed, but only abnormal results are displayed) Labs Reviewed  POCT FASTING CBG [facility] MANUAL ENTRY - Abnormal; Notable for the following components:      Result Value   POCT Glucose ([facility]) 102 (*)    All other components within normal limits  CBC  BASIC METABOLIC PANEL WITH GFR    EKG   Radiology No results found.  Procedures Procedures (including critical care time)  Medications Ordered in UC Medications - No data to display  Initial Impression / Assessment and Plan / UC Course  I have reviewed the triage vital signs and the nursing notes.  Pertinent labs & imaging results that were available during my care of the patient were reviewed by me and considered in my medical decision making (see chart for details).     Sugar is 102.  CBC and BMP are drawn and we will notify her if anything significantly abnormal.  Meclizine  is sent in for the vertigo, and I would like her to follow-up with her primary care Final Clinical  Impressions(s) / UC Diagnoses   Final diagnoses:  Vertigo     Discharge Instructions      Your sugar was 102.  We have drawn blood to check your blood counts and electrolytes.  Our staff will notify you if anything is significantly abnormal.  Take meclizine  25 mg--1 tablet every 8 hours as needed for vertigo  Please follow-up with your primary care about this issue     ED Prescriptions     Medication Sig Dispense Auth. Provider   meclizine  (ANTIVERT ) 25 MG tablet Take 1 tablet (25 mg total) by mouth 3 (three) times daily as needed for dizziness (vertigo). 15 tablet Myrtie Leuthold K, MD      PDMP not reviewed this encounter.   Ann Keto, MD 04/03/24 Viviane Gros    Ann Keto, MD 04/03/24 [phone]

## 2024-04-04 DIAGNOSIS — H35373 Puckering of macula, bilateral: Secondary | ICD-10-CM | POA: Diagnosis not present

## 2024-04-04 DIAGNOSIS — E119 Type 2 diabetes mellitus without complications: Secondary | ICD-10-CM | POA: Diagnosis not present

## 2024-04-04 DIAGNOSIS — H04123 Dry eye syndrome of bilateral lacrimal glands: Secondary | ICD-10-CM | POA: Diagnosis not present

## 2024-04-04 DIAGNOSIS — H25813 Combined forms of age-related cataract, bilateral: Secondary | ICD-10-CM | POA: Diagnosis not present

## 2024-04-04 DIAGNOSIS — H0102B Squamous blepharitis left eye, upper and lower eyelids: Secondary | ICD-10-CM | POA: Diagnosis not present

## 2024-04-04 DIAGNOSIS — H401123 Primary open-angle glaucoma, left eye, severe stage: Secondary | ICD-10-CM | POA: Diagnosis not present

## 2024-04-04 DIAGNOSIS — H401112 Primary open-angle glaucoma, right eye, moderate stage: Secondary | ICD-10-CM | POA: Diagnosis not present

## 2024-04-04 DIAGNOSIS — H353122 Nonexudative age-related macular degeneration, left eye, intermediate dry stage: Secondary | ICD-10-CM | POA: Diagnosis not present

## 2024-04-04 DIAGNOSIS — H0102A Squamous blepharitis right eye, upper and lower eyelids: Secondary | ICD-10-CM | POA: Diagnosis not present

## 2024-04-04 DIAGNOSIS — H353111 Nonexudative age-related macular degeneration, right eye, early dry stage: Secondary | ICD-10-CM | POA: Diagnosis not present

## 2024-04-04 LAB — HM DIABETES EYE EXAM

## 2024-04-23 ENCOUNTER — Other Ambulatory Visit (HOSPITAL_COMMUNITY): Payer: Self-pay

## 2024-04-23 DIAGNOSIS — H353111 Nonexudative age-related macular degeneration, right eye, early dry stage: Secondary | ICD-10-CM | POA: Diagnosis not present

## 2024-04-23 DIAGNOSIS — H25813 Combined forms of age-related cataract, bilateral: Secondary | ICD-10-CM | POA: Diagnosis not present

## 2024-04-23 DIAGNOSIS — H04123 Dry eye syndrome of bilateral lacrimal glands: Secondary | ICD-10-CM | POA: Diagnosis not present

## 2024-04-23 DIAGNOSIS — H401112 Primary open-angle glaucoma, right eye, moderate stage: Secondary | ICD-10-CM | POA: Diagnosis not present

## 2024-04-23 DIAGNOSIS — H35373 Puckering of macula, bilateral: Secondary | ICD-10-CM | POA: Diagnosis not present

## 2024-04-23 DIAGNOSIS — H0102B Squamous blepharitis left eye, upper and lower eyelids: Secondary | ICD-10-CM | POA: Diagnosis not present

## 2024-04-23 DIAGNOSIS — E119 Type 2 diabetes mellitus without complications: Secondary | ICD-10-CM | POA: Diagnosis not present

## 2024-04-23 DIAGNOSIS — H0102A Squamous blepharitis right eye, upper and lower eyelids: Secondary | ICD-10-CM | POA: Diagnosis not present

## 2024-04-23 DIAGNOSIS — H401123 Primary open-angle glaucoma, left eye, severe stage: Secondary | ICD-10-CM | POA: Diagnosis not present

## 2024-04-23 DIAGNOSIS — H353122 Nonexudative age-related macular degeneration, left eye, intermediate dry stage: Secondary | ICD-10-CM | POA: Diagnosis not present

## 2024-04-23 MED ORDER — OFLOXACIN 0.3 % OP SOLN
1.0000 [drp] | Freq: Four times a day (QID) | OPHTHALMIC | 1 refills | Status: DC
  Filled 2024-04-23: qty 5, 25d supply, fill #0

## 2024-04-23 MED ORDER — PREDNISOLONE ACETATE 1 % OP SUSP
1.0000 [drp] | Freq: Four times a day (QID) | OPHTHALMIC | 1 refills | Status: DC
  Filled 2024-04-23: qty 10, 50d supply, fill #0

## 2024-04-29 ENCOUNTER — Other Ambulatory Visit: Payer: Self-pay | Admitting: Nurse Practitioner

## 2024-04-29 ENCOUNTER — Other Ambulatory Visit (HOSPITAL_COMMUNITY): Payer: Self-pay

## 2024-04-29 DIAGNOSIS — I119 Hypertensive heart disease without heart failure: Secondary | ICD-10-CM

## 2024-04-29 MED ORDER — OLMESARTAN MEDOXOMIL 20 MG PO TABS
20.0000 mg | ORAL_TABLET | Freq: Two times a day (BID) | ORAL | 1 refills | Status: DC
  Filled 2024-04-29: qty 90, 45d supply, fill #0
  Filled 2024-07-04: qty 90, 45d supply, fill #1

## 2024-05-03 ENCOUNTER — Telehealth: Payer: Self-pay | Admitting: Pharmacist

## 2024-05-03 DIAGNOSIS — I119 Hypertensive heart disease without heart failure: Secondary | ICD-10-CM

## 2024-05-04 ENCOUNTER — Ambulatory Visit (HOSPITAL_COMMUNITY)
Admission: EM | Admit: 2024-05-04 | Discharge: 2024-05-04 | Disposition: A | Discharge status: Home / Self Care | Attending: Nurse Practitioner | Admitting: Nurse Practitioner

## 2024-05-04 ENCOUNTER — Encounter (HOSPITAL_COMMUNITY): Payer: Self-pay

## 2024-05-04 DIAGNOSIS — R600 Localized edema: Secondary | ICD-10-CM

## 2024-05-04 MED ORDER — FUROSEMIDE 20 MG PO TABS
20.0000 mg | ORAL_TABLET | Freq: Every day | ORAL | 0 refills | Status: DC
Start: 2024-05-04 — End: 2024-06-17
  Filled 2024-05-04: qty 5, 5d supply, fill #0

## 2024-05-04 MED ORDER — POTASSIUM CHLORIDE ER 8 MEQ PO CPCR
8.0000 meq | ORAL_CAPSULE | Freq: Every day | ORAL | 0 refills | Status: DC
  Filled 2024-05-04 – 2024-05-06 (×2): qty 5, 5d supply, fill #0

## 2024-05-04 NOTE — Discharge Instructions (Addendum)
 You have been diagnosed with peripheral edema.  You have been prescribed  diuretic/fluid pill, furosemide 20 mg 1 tablet daily for 5 days.  You have also been given a low dose potassium tablet to take along with the furosemide to prevent your potassium getting too low..  This will help get rid of the bilateral lower extremity edema.  You are encouraged to keep your legs elevated during this time.  You are encouraged to use compression hose when you are up for long periods of time walking.  You are also encouraged to wear supportive shoes when you are walking.  Flip-flops are not the best footwear for exercise or being on your feet for extended periods of time.  If you develop shortness of breath, chest pain you are encouraged to go to the nearest emergency for further evaluation.  Your chart indicates that you are not to establish care with cardiology.  The recommendation is for you to call follow-up with your primary care office on this referral.  You are also to follow-up with your primary care provider and make her aware that you have been seen in urgent care and was started on a diuretic/fluid pill for swelling in your legs.  You will need to follow-up with her within one week or 2 weeks.  You are encouraged to rest in your bed verses the sofa.  Encouraged to establish care with ear nose and throat to have evaluation for the tinnitus which is chronic but worsening.

## 2024-05-04 NOTE — ED Provider Notes (Signed)
 [facility]    CSN: [id number] Arrival date & time: 05/04/24  1446      History   Chief Complaint Chief Complaint  Patient presents with   Leg Swelling    HPI Allison Franco is a 83 y.o. female.   HPI   She is in today for evaluation of bilateral lower extremity edema.  She denies any chest pain.  She does have shortness of breath on exertion.  She denies any headaches, dizziness, nausea, vomiting.  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  She also endorses that she is wearing flip-flops because she wants to be free.  She also reports that she is now sleeping on her sofa due to air going into her ear when she is in her bedroom.  She has a history of tinnitus.  She is currently wearing hearing aids but do not feel like they are effective.  This is causing her to be restless and causes depression.  She would like to be seen by a specialist.  She was recently seen at her primary care provider and a cardiology referral was suggested however there is no appointment noted within the medical records. Past Medical History:  Diagnosis Date   Anxiety    Constipation    Depression    "following husband's death"   Diabetes mellitus without complication (HCC)    Hypertension    KNEE PAIN, BILATERAL 02/13/2009   Qualifier: Diagnosis of  By: Gaylyn Keas FNP, Davene Ernst     Tinnitus     Patient Active Problem List   Diagnosis Date Noted   Encounter for annual health examination 03/11/2024   Abnormal EKG 03/11/2024   Type 2 diabetes mellitus with hyperlipidemia (HCC) 10/24/2023   Need for influenza vaccination 10/24/2023   COVID-19 vaccine administered 10/24/2023   Seasonal allergies 07/29/2023   Dysfunction of Eustachian tube, bilateral 07/29/2023   Obesity (BMI 30.0-34.9) 06/20/2023   Primary open angle glaucoma of both eyes, severe stage 06/20/2023   Facial rash 06/20/2023   Aortic atherosclerosis (HCC) 02/23/2023    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/23/2023   Constipation 02/14/2023   Unilateral primary osteoarthritis, left knee 05/18/2022   Inguinal hernia with bowel obstruction 08/07/2019   Incarcerated left inguinal hernia 08/07/2019   Sensorineural hearing loss (SNHL) of right ear with restricted hearing of left ear 05/21/2019   Tinnitus of both ears 05/21/2019   Anemia 08/31/2018   Type 2 diabetes mellitus without complication, without long-term current use of insulin  (HCC) 07/02/2018   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      Home Medications    Prior to Admission medications   Medication Sig Start Date End Date Taking? Authorizing Provider  acetaminophen  (TYLENOL ) 500 MG tablet Take 1 tablet (500 mg total) by mouth every 4 (four) hours as needed. 02/21/23  Yes Rising, Ivette Marks, PA-C  albuterol  (VENTOLIN  HFA) 108 (90  Base) MCG/ACT inhaler Inhale 1-2 puffs into the lungs every 6 (six) hours as needed for wheezing or shortness of breath. 10/17/23  Yes Gregoria Leas, NP  Alcohol Swabs (ALCOHOL WIPES) 70 % PADS by Does not apply route. Use with blood sugar check and injection of insulin    Yes [provider]  Cholecalciferol  (VITAMIN D3) 25 MCG (1000 UT) CAPS Take 1 capsule (1,000 Units total) by mouth daily. 06/02/21  Yes [name], FNP  dorzolamide -timolol  (COSOPT ) 2-0.5 % ophthalmic solution Instill 1 drop into both eyes twice a day 01/25/23  Yes   dorzolamide -timolol   (COSOPT ) 2-0.5 % ophthalmic solution Instill 1 drop into both eyes twice a day 05/05/23  Yes   furosemide (LASIX) 20 MG tablet Take 1 tablet (20 mg total) by mouth daily for 5 days. 05/04/24 05/09/24 Yes Jadalynn Burr, Marcelene Sep, NP  glucose blood (ACCU-CHEK GUIDE) test strip TEST 3 TIMES A DAY 08/29/22  Yes [name], FNP  MELATONIN PO Take by mouth.   Yes [provider]  metFORMIN  (GLUCOPHAGE ) 500 MG tablet Take 1 tablet (500 mg total) by mouth daily with breakfast. 10/24/23  Yes [name], FNP  Multiple Vitamins-Minerals (CENTRUM SILVER 50+WOMEN) TABS Take 1 tablet by mouth daily.   Yes [provider]  olmesartan  (BENICAR ) 20 MG tablet Take 1 tablet (20 mg total) by mouth in the morning and at bedtime. 04/29/24  Yes [name], FNP  Potassium Chloride  CR (MICRO-K ) 8 MEQ CPCR capsule CR Take 1 capsule (8 mEq total) by mouth daily for 5 doses. 05/04/24 05/09/24 Yes Gregoria Leas, NP    Family History Family History  Problem Relation Age of Onset   Diabetes Mother    Hypertension Father    Breast cancer Neg Hx     Social History Social History   Tobacco Use   Smoking status: Never   Smokeless tobacco: Never  Vaping Use   Vaping status: Never Used  Substance Use Topics   Alcohol use: No   Drug use: No     Allergies   Aspirin , Hydroxyzine , and Valium  [diazepam ]   Review of Systems Review of Systems   Physical Exam Triage Vital Signs ED Triage Vitals  Encounter Vitals Group     BP      Systolic BP Percentile      Diastolic BP Percentile      Pulse      Resp      Temp      Temp src      SpO2      Weight      Height      Head Circumference      Peak Flow      Pain Score      Pain Loc      Pain Education      Exclude from Growth Chart    No data found.  Updated Vital Signs BP 136/70 (BP Location: Right Arm)   Pulse 81   Temp 98.1 F (36.7 C) (Oral)   Resp 18   Ht 5\' 3"  (1.6 m)   Wt 179 lb 14.3 oz (81.6 kg)   SpO2 95%   BMI 31.87 kg/m    Visual Acuity Right Eye Distance:   Left Eye Distance:   Bilateral Distance:    Right Eye Near:   Left Eye Near:    Bilateral Near:     Physical Exam Constitutional:      Appearance: She is obese.  HENT:  Head: Normocephalic.  Cardiovascular:     Rate and Rhythm: Normal rate and regular rhythm.  Pulmonary:     Effort: Pulmonary effort is normal.     Breath sounds: Normal breath sounds.  Musculoskeletal:     Right lower leg: Edema (2 +) present.     Left lower leg: Edema (2+) present.  Skin:    General: Skin is warm.     Capillary Refill: Capillary refill takes 2 to 3 seconds.     Comments: Dark discoloration to bilateral feet.   Neurological:     General: No focal deficit present.     Mental Status: She is alert and oriented to person, place, and time.  Psychiatric:        Mood and Affect: Mood normal.        Behavior: Behavior normal.      UC Treatments / Results  Labs (all labs ordered are listed, but only abnormal results are displayed) Labs Reviewed - No data to display  EKG   Radiology No results found.  Procedures Procedures (including critical care time)  Medications Ordered in UC Medications - No data to display  Initial Impression / Assessment and Plan / UC Course  I have reviewed the triage vital signs and the nursing notes.  Pertinent labs & imaging results that were available during my care of the patient were reviewed by me and considered in my medical decision making (see chart for details).     edema Final Clinical Impressions(s) / UC Diagnoses   Final diagnoses:  Peripheral edema     Discharge Instructions      You have been diagnosed with peripheral edema.  You have been prescribed  diuretic/fluid pill, furosemide 20 mg 1 tablet daily for 5 days.  You have also been given a low dose potassium tablet to take along with the furosemide to prevent your potassium getting too low..  This will help get rid of the bilateral lower  extremity edema.  You are encouraged to keep your legs elevated during this time.  You are encouraged to use compression hose when you are up for long periods of time walking.  You are also encouraged to wear supportive shoes when you are walking.  Flip-flops are not the best footwear for exercise or being on your feet for extended periods of time.  If you develop shortness of breath, chest pain you are encouraged to go to the nearest emergency for further evaluation.  Your chart indicates that you are not to establish care with cardiology.  The recommendation is for you to call follow-up with your primary care office on this referral.  You are also to follow-up with your primary care provider and make her aware that you have been seen in urgent care and was started on a diuretic/fluid pill for swelling in your legs.  You will need to follow-up with her within one week or 2 weeks.  You are encouraged to rest in your bed verses the sofa.  Encouraged to establish care with ear nose and throat to have evaluation for the tinnitus which is chronic but worsening.   ED Prescriptions     Medication Sig Dispense Auth. Provider   furosemide (LASIX) 20 MG tablet Take 1 tablet (20 mg total) by mouth daily for 5 days. 5 tablet Gregoria Leas, NP   Potassium Chloride  CR (MICRO-K ) 8 MEQ CPCR capsule CR Take 1 capsule (8 mEq total) by mouth daily for 5 doses. 5 capsule Alene Husk, [employer]  M, NP      PDMP not reviewed this encounter.   Eleanore Grey [place], [place] 05/04/24 [phone]

## 2024-05-04 NOTE — ED Triage Notes (Signed)
 Patient presenting with bilateral lower leg swelling onset 1 week ago. States she has not had this happen before. No known falls or injuries. Denies pain. States she does have pain in the knees at times.   Patient states also dealing with restlessness, ear discomfort, trouble sleeping, and depression. Melatonin is not helping her sleep.   Prescriptions or OTC medications tried: Yes- tylenol  and Diclofenac  topical   with mild relief

## 2024-05-06 ENCOUNTER — Other Ambulatory Visit (HOSPITAL_COMMUNITY): Payer: Self-pay

## 2024-05-06 NOTE — Progress Notes (Signed)
   05/06/2024  Patient ID: Allison Franco, female   DOB: [birth year], 83 y.o.   MRN: [id number]  Pharmacy Quality Measure Review  This patient is appearing on a report for being at risk of failing the adherence measure for hypertension (ACEi/ARB) medications this calendar year.   Medication: Olmesartan  20 mg Last fill date: 04/29/24 for 90 day supply  Insurance report was not up to date. No action needed at this time.   Last impact date 05/18/24 per report. Patient should be passing the measure for now.  Geronimo Krabbe, PharmD, BCACP Clinical Pharmacist [phone]

## 2024-05-07 ENCOUNTER — Other Ambulatory Visit (HOSPITAL_COMMUNITY): Payer: Self-pay

## 2024-05-08 ENCOUNTER — Other Ambulatory Visit (HOSPITAL_COMMUNITY): Payer: Self-pay

## 2024-05-08 ENCOUNTER — Encounter (HOSPITAL_COMMUNITY): Payer: Self-pay

## 2024-06-17 ENCOUNTER — Ambulatory Visit (HOSPITAL_COMMUNITY)
Admission: EM | Admit: 2024-06-17 | Discharge: 2024-06-17 | Disposition: A | Discharge status: Home / Self Care | Attending: Nurse Practitioner | Admitting: Nurse Practitioner

## 2024-06-17 ENCOUNTER — Encounter (HOSPITAL_COMMUNITY): Payer: Self-pay

## 2024-06-17 ENCOUNTER — Other Ambulatory Visit (HOSPITAL_COMMUNITY): Payer: Self-pay

## 2024-06-17 DIAGNOSIS — R600 Localized edema: Secondary | ICD-10-CM

## 2024-06-17 DIAGNOSIS — K219 Gastro-esophageal reflux disease without esophagitis: Secondary | ICD-10-CM

## 2024-06-17 DIAGNOSIS — R1013 Epigastric pain: Secondary | ICD-10-CM

## 2024-06-17 MED ORDER — FUROSEMIDE 20 MG PO TABS
20.0000 mg | ORAL_TABLET | Freq: Every morning | ORAL | 0 refills | Status: DC
  Filled 2024-06-17: qty 7, 7d supply, fill #0

## 2024-06-17 MED ORDER — POTASSIUM CHLORIDE ER 10 MEQ PO TBCR
10.0000 meq | EXTENDED_RELEASE_TABLET | Freq: Every day | ORAL | 0 refills | Status: AC
Start: 2024-06-17 — End: 2024-08-16
  Filled 2024-06-17: qty 7, 7d supply, fill #0

## 2024-06-17 MED ORDER — OMEPRAZOLE 20 MG PO CPDR
20.0000 mg | DELAYED_RELEASE_CAPSULE | Freq: Every day | ORAL | 0 refills | Status: DC
  Filled 2024-06-17: qty 30, 30d supply, fill #0

## 2024-06-17 NOTE — ED Triage Notes (Signed)
 Patient states she has bilateral leg swelling and was seen on 05/04/24. When asked aboout how long, patient states same as the knee pain.  Patient also c/o bilateral knee pain which she says has been a long time.  Patient c/o mid abdominal pain x 1 month.Patient denies N/V/d.  Patient states she has been taking Tylenol  for pain and the lst dose was at 0900 today.

## 2024-06-17 NOTE — Discharge Instructions (Addendum)
 You are being treated today for ongoing leg swelling and stomach discomfort. You were prescribed a water pill (Lasix ) to help reduce the swelling and a potassium supplement to help maintain proper electrolyte balance while on this medication. To support symptom relief, you should wear compression socks or stockings during the day, elevate your legs when sitting or lying down, and try to move around frequently throughout the day. Increasing foods rich in potassium such as spinach, kale, collards, bananas, and sweet potatoes may also be helpful.  You were also started on omeprazole  to help with stomach discomfort that seems to worsen after eating, which may be related to acid reflux. Avoid heavy meals, greasy or spicy foods, and try not to lie down right after eating.  Follow up with your primary care provider within one week to reassess the swelling and evaluate the need for long-term treatment. Go to the emergency department if you develop chest pain, trouble breathing, sudden weight gain, worsening swelling, severe abdominal pain, vomiting, or if you feel lightheaded or faint.

## 2024-06-17 NOTE — ED Provider Notes (Signed)
 [facility]    CSN: [id number] Arrival date & time: 06/17/24  1352      History   Chief Complaint Chief Complaint  Patient presents with   Abdominal Pain   Leg Swelling   Knee Pain    HPI Allison Franco is a 83 y.o. female.   Discussed the use of AI scribe software for clinical note transcription with the patient, who gave verbal consent to proceed.   Patient presents with complaints of swollen legs and epigastric pain. This is her second visit for leg swelling.  The patient reports swollen legs, which she has experienced before. She also describes a pain in her epigastric region that she characterizes as feeling like a sting. This pain has been present for about 3 weeks and is intermittent. The patient notes that the epigastric discomfort worsens after eating. She denies any associated nausea or vomiting.  Regarding her leg swelling, patient mentions that she was previously given medication for this issue during an urgent care visit in June, which helped reduce the swelling. However, she states that the swelling has started to return after finishing the medication.   The patient denies experiencing chest tightness, chest pain, shortness of breath, or difficulty breathing. She also denies waking up in the middle of the night coughing or feeling like she's choking. She denies any numbness or tingling in her feet or legs but does mention feeling pain in these areas.  The patient has a history of hypertension and diabetes, for which is prescribed medication and reports compliance with. Her last visit to her primary care provider was on April 2nd, 2025  The following portions of the patient's history were reviewed and updated as appropriate: allergies, current medications, past family history, past medical history, past social history, past surgical history, and problem list.    Past Medical History:  Diagnosis Date   Anxiety    Constipation    Depression    following  husband's death   Diabetes mellitus without complication (HCC)    Hypertension    KNEE PAIN, BILATERAL 02/13/2009   Qualifier: Diagnosis of  By: Gladis FNP, Delorise     Tinnitus     Patient Active Problem List   Diagnosis Date Noted   Encounter for annual health examination 03/11/2024   Abnormal EKG 03/11/2024   Type 2 diabetes mellitus with hyperlipidemia (HCC) 10/24/2023   Need for influenza vaccination 10/24/2023   COVID-19 vaccine administered 10/24/2023   Seasonal allergies 07/29/2023   Dysfunction of Eustachian tube, bilateral 07/29/2023   Obesity (BMI 30.0-34.9) 06/20/2023   Primary open angle glaucoma of both eyes, severe stage 06/20/2023   Facial rash 06/20/2023   Aortic atherosclerosis (HCC) 02/23/2023   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/23/2023   Constipation 02/14/2023   Unilateral primary osteoarthritis, left knee 05/18/2022   Inguinal hernia with bowel obstruction 08/07/2019   Incarcerated left inguinal hernia 08/07/2019   Sensorineural hearing loss (SNHL) of right ear with restricted hearing of left ear 05/21/2019   Tinnitus of both ears 05/21/2019   Anemia 08/31/2018   Type 2 diabetes mellitus without complication, without long-term current use of insulin  (HCC) 07/02/2018   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      Home Medications    Prior to Admission medications   Medication Sig Start Date End Date Taking? Authorizing Provider  furosemide   (LASIX ) 20 MG tablet Take 1 tablet (20 mg total) by mouth every morning for 7 days. 06/17/24 06/24/24 Yes Iola Lukes, FNP  omeprazole  (PRILOSEC) 20 MG capsule Take 1 capsule (20 mg total) by mouth daily. 06/17/24  Yes Iola Lukes, FNP  potassium chloride  (KLOR-CON ) 10 MEQ tablet Take 1 tablet (10 mEq total) by mouth daily for 7 days. 06/17/24 06/24/24 Yes Alija Riano, FNP  acetaminophen  (TYLENOL ) 500 MG tablet Take 1 tablet (500 mg total) by mouth every 4 (four) hours as needed. 02/21/23   Rising, Asberry, PA-C  albuterol  (VENTOLIN  HFA) 108 (90 Base) MCG/ACT inhaler Inhale 1-2 puffs into the lungs every 6 (six) hours as needed for wheezing or shortness of breath. 10/17/23   Myrna Camelia HERO, NP  Alcohol Swabs (ALCOHOL WIPES) 70 % PADS by Does not apply route. Use with blood sugar check and injection of insulin     [provider]  Cholecalciferol  (VITAMIN D3) 25 MCG (1000 UT) CAPS Take 1 capsule (1,000 Units total) by mouth daily. 06/02/21   Moore, Janece, FNP  dorzolamide -timolol  (COSOPT ) 2-0.5 % ophthalmic solution Instill 1 drop into both eyes twice a day 01/25/23     dorzolamide -timolol  (COSOPT ) 2-0.5 % ophthalmic solution Instill 1 drop into both eyes twice a day 05/05/23     glucose blood (ACCU-CHEK GUIDE) test strip TEST 3 TIMES A DAY 08/29/22   Georgina Speaks, FNP  MELATONIN PO Take by mouth.    [provider]  metFORMIN  (GLUCOPHAGE ) 500 MG tablet Take 1 tablet (500 mg total) by mouth daily with breakfast. 10/24/23   Georgina Speaks, FNP  Multiple Vitamins-Minerals (CENTRUM SILVER 50+WOMEN) TABS Take 1 tablet by mouth daily.    [provider]  olmesartan  (BENICAR ) 20 MG tablet Take 1 tablet (20 mg total) by mouth in the morning and at bedtime. 04/29/24   Georgina Speaks, FNP    Family History Family History  Problem Relation Age of Onset   Diabetes Mother    Hypertension Father    Breast cancer Neg Hx     Social History Social History   Tobacco Use    Smoking status: Never   Smokeless tobacco: Never  Vaping Use   Vaping status: Never Used  Substance Use Topics   Alcohol use: No   Drug use: No     Allergies   Aspirin , Hydroxyzine , and Valium  [diazepam ]   Review of Systems Review of Systems  Respiratory:  Negative for choking and shortness of breath.   Cardiovascular:  Positive for leg swelling (bilateral). Negative for chest pain and palpitations.  Gastrointestinal:  Positive for abdominal pain (epigastric pain x 3 weeks). Negative for nausea and vomiting.  Neurological:  Negative for weakness and numbness.  All other systems reviewed and are negative.    Physical Exam Triage Vital Signs ED Triage Vitals  Encounter Vitals Group     BP 06/17/24 1541 137/79     Girls Systolic BP Percentile --      Girls Diastolic BP Percentile --      Boys Systolic BP Percentile --  Boys Diastolic BP Percentile --      Pulse Rate 06/17/24 1541 67     Resp 06/17/24 1541 16     Temp 06/17/24 1541 98 F (36.7 C)     Temp Source 06/17/24 1541 Oral     SpO2 06/17/24 1541 98 %     Weight --      Height --      Head Circumference --      Peak Flow --      Pain Score 06/17/24 1543 8     Pain Loc --      Pain Education --      Exclude from Growth Chart --    No data found.  Updated Vital Signs BP 137/79 (BP Location: Left Arm)   Pulse 67   Temp 98 F (36.7 C) (Oral)   Resp 16   SpO2 98%   Visual Acuity Right Eye Distance:   Left Eye Distance:   Bilateral Distance:    Right Eye Near:   Left Eye Near:    Bilateral Near:     Physical Exam Vitals reviewed.  Constitutional:      General: She is awake. She is not in acute distress.    Appearance: Normal appearance. She is well-developed. She is not ill-appearing, toxic-appearing or diaphoretic.  HENT:     Head: Normocephalic.     Right Ear: Hearing normal.     Left Ear: Hearing normal.     Nose: Nose normal.     Mouth/Throat:     Mouth: Mucous membranes are moist.   Eyes:     General: Vision grossly intact.     Conjunctiva/sclera: Conjunctivae normal.  Cardiovascular:     Rate and Rhythm: Normal rate and regular rhythm.     Heart sounds: Normal heart sounds.  Pulmonary:     Effort: Pulmonary effort is normal.     Breath sounds: Normal breath sounds and air entry.  Abdominal:     Palpations: Abdomen is soft.     Tenderness: There is no abdominal tenderness.  Musculoskeletal:        General: Normal range of motion.     Cervical back: Full passive range of motion without pain, normal range of motion and neck supple.     Right lower leg: 2+ Pitting Edema present.     Left lower leg: 2+ Pitting Edema present.  Skin:    General: Skin is warm and dry.  Neurological:     General: No focal deficit present.     Mental Status: She is alert and oriented to person, place, and time.  Psychiatric:        Speech: Speech normal.        Behavior: Behavior is cooperative.      UC Treatments / Results  Labs (all labs ordered are listed, but only abnormal results are displayed) Labs Reviewed - No data to display  EKG   Radiology No results found.  Procedures Procedures (including critical care time)  Medications Ordered in UC Medications - No data to display  Initial Impression / Assessment and Plan / UC Course  I have reviewed the triage vital signs and the nursing notes.  Pertinent labs & imaging results that were available during my care of the patient were reviewed by me and considered in my medical decision making (see chart for details).    Patient presents with bilateral leg swelling ongoing for approximately three weeks, partially responsive to a short course of diuretics  prescribed during a prior visit. She also reports intermittent epigastric discomfort worsened by meals, without associated nausea, vomiting, chest pain, or respiratory symptoms. History is notable for hypertensive heart disease without heart failure. Recent labs show  mildly low sodium but preserved kidney function and potassium levels. The clinical picture is consistent with peripheral edema likely related to venous insufficiency and/or volume overload, and epigastric discomfort possibly related to gastroesophageal reflux disease. Treatment initiated includes a short course of furosemide  with potassium supplementation, omeprazole  for presumed GERD, and supportive measures such as leg elevation and use of compression stockings. Patient was advised to increase dietary potassium and follow up with her primary care provider in one week for reassessment of edema and further management. Return to the clinic or emergency department was advised for worsening swelling, new shortness of breath, chest pain, or other concerning symptoms.  Today's evaluation has revealed no signs of a dangerous process. Discussed diagnosis with patient and/or guardian. Patient and/or guardian aware of their diagnosis, possible red flag symptoms to watch out for and need for close follow up. Patient and/or guardian understands verbal and written discharge instructions. Patient and/or guardian comfortable with plan and disposition.  Patient and/or guardian has a clear mental status at this time, good insight into illness (after discussion and teaching) and has clear judgment to make decisions regarding their care  Documentation was completed with the aid of voice recognition software. Transcription may contain typographical errors.   Final Clinical Impressions(s) / UC Diagnoses   Final diagnoses:  Peripheral edema  Abdominal pain, epigastric  Gastroesophageal reflux disease without esophagitis     Discharge Instructions      You are being treated today for ongoing leg swelling and stomach discomfort. You were prescribed a water pill (Lasix ) to help reduce the swelling and a potassium supplement to help maintain proper electrolyte balance while on this medication. To support symptom relief,  you should wear compression socks or stockings during the day, elevate your legs when sitting or lying down, and try to move around frequently throughout the day. Increasing foods rich in potassium such as spinach, kale, collards, bananas, and sweet potatoes may also be helpful.  You were also started on omeprazole  to help with stomach discomfort that seems to worsen after eating, which may be related to acid reflux. Avoid heavy meals, greasy or spicy foods, and try not to lie down right after eating.  Follow up with your primary care provider within one week to reassess the swelling and evaluate the need for long-term treatment. Go to the emergency department if you develop chest pain, trouble breathing, sudden weight gain, worsening swelling, severe abdominal pain, vomiting, or if you feel lightheaded or faint.      ED Prescriptions     Medication Sig Dispense Auth. Provider   furosemide  (LASIX ) 20 MG tablet Take 1 tablet (20 mg total) by mouth every morning for 7 days. 7 tablet Iola Lukes, FNP   potassium chloride  (KLOR-CON ) 10 MEQ tablet Take 1 tablet (10 mEq total) by mouth daily for 7 days. 7 tablet Iola Lukes, FNP   omeprazole  (PRILOSEC) 20 MG capsule Take 1 capsule (20 mg total) by mouth daily. 30 capsule Iola Lukes, FNP      PDMP not reviewed this encounter.   Iola Lukes, [place] 06/17/24 [id number]

## 2024-06-18 ENCOUNTER — Other Ambulatory Visit (HOSPITAL_COMMUNITY): Payer: Self-pay

## 2024-06-27 ENCOUNTER — Other Ambulatory Visit (HOSPITAL_COMMUNITY): Payer: Self-pay

## 2024-06-28 ENCOUNTER — Other Ambulatory Visit (HOSPITAL_COMMUNITY): Payer: Self-pay

## 2024-07-01 ENCOUNTER — Other Ambulatory Visit (HOSPITAL_COMMUNITY): Payer: Self-pay

## 2024-07-01 MED ORDER — DORZOLAMIDE HCL-TIMOLOL MAL 2-0.5 % OP SOLN
1.0000 [drp] | Freq: Two times a day (BID) | OPHTHALMIC | 1 refills | Status: AC
  Filled 2024-07-01: qty 10, 50d supply, fill #0
  Filled 2024-09-23: qty 10, 50d supply, fill #1

## 2024-07-04 ENCOUNTER — Other Ambulatory Visit (HOSPITAL_COMMUNITY): Payer: Self-pay

## 2024-07-05 ENCOUNTER — Other Ambulatory Visit (HOSPITAL_COMMUNITY): Payer: Self-pay

## 2024-07-19 ENCOUNTER — Other Ambulatory Visit (HOSPITAL_COMMUNITY): Payer: Self-pay

## 2024-07-19 ENCOUNTER — Encounter (HOSPITAL_COMMUNITY): Payer: Self-pay

## 2024-07-19 ENCOUNTER — Ambulatory Visit (HOSPITAL_COMMUNITY)
Admission: EM | Admit: 2024-07-19 | Discharge: 2024-07-19 | Disposition: A | Discharge status: Home / Self Care | Attending: Family Medicine | Admitting: Family Medicine

## 2024-07-19 ENCOUNTER — Ambulatory Visit (HOSPITAL_COMMUNITY): Payer: Self-pay

## 2024-07-19 DIAGNOSIS — H9319 Tinnitus, unspecified ear: Secondary | ICD-10-CM | POA: Diagnosis not present

## 2024-07-19 DIAGNOSIS — R5383 Other fatigue: Secondary | ICD-10-CM | POA: Diagnosis not present

## 2024-07-19 DIAGNOSIS — R351 Nocturia: Secondary | ICD-10-CM | POA: Diagnosis not present

## 2024-07-19 DIAGNOSIS — R52 Pain, unspecified: Secondary | ICD-10-CM | POA: Insufficient documentation

## 2024-07-19 DIAGNOSIS — R519 Headache, unspecified: Secondary | ICD-10-CM

## 2024-07-19 LAB — COMPREHENSIVE METABOLIC PANEL WITH GFR
ALT: 26 U/L (ref 0–44)
AST: 34 U/L (ref 15–41)
Albumin: 3.8 g/dL (ref 3.5–5.0)
Alkaline Phosphatase: 80 U/L (ref 38–126)
Anion gap: 9 (ref 5–15)
BUN: 16 mg/dL (ref 8–23)
CO2: 25 mmol/L (ref 22–32)
Calcium: 9.3 mg/dL (ref 8.9–10.3)
Chloride: 104 mmol/L (ref 98–111)
Creatinine, Ser: 0.79 mg/dL (ref 0.44–1.00)
GFR, Estimated: >60 mL/min (ref >60)
Glucose, Bld: 71 mg/dL (ref 70–99)
Potassium: 4.7 mmol/L (ref 3.5–5.1)
Sodium: 138 mmol/L (ref 135–145)
Total Bilirubin: 0.8 mg/dL (ref 0.0–1.2)
Total Protein: 7.2 g/dL (ref 6.5–8.1)

## 2024-07-19 LAB — CBC WITH DIFFERENTIAL/PLATELET
Abs Immature Granulocytes: 0.01 K/uL (ref 0.00–0.07)
Basophils Absolute: 0 K/uL (ref 0.0–0.1)
Basophils Relative: 1 %
Eosinophils Absolute: 0.1 K/uL (ref 0.0–0.5)
Eosinophils Relative: 3 %
HCT: 39.9 % (ref 36.0–46.0)
Hemoglobin: 12.5 g/dL (ref 12.0–15.0)
Immature Granulocytes: 0 %
Lymphocytes Relative: 28 %
Lymphs Abs: 1.4 K/uL (ref 0.7–4.0)
MCH: 28.6 pg (ref 26.0–34.0)
MCHC: 31.3 g/dL (ref 30.0–36.0)
MCV: 91.3 fL (ref 80.0–100.0)
Monocytes Absolute: 0.6 K/uL (ref 0.1–1.0)
Monocytes Relative: 11 %
Neutro Abs: 3 K/uL (ref 1.7–7.7)
Neutrophils Relative %: 57 %
Platelets: 230 K/uL (ref 150–400)
RBC: 4.37 MIL/uL (ref 3.87–5.11)
RDW: 12.1 % (ref 11.5–15.5)
WBC: 5.2 K/uL (ref 4.0–10.5)
nRBC: 0 % (ref 0.0–0.2)

## 2024-07-19 LAB — POC SARS CORONAVIRUS 2 AG -  ED: SARS Coronavirus 2 Ag: NEGATIVE

## 2024-07-19 LAB — POCT URINALYSIS DIP (MANUAL ENTRY)
Bilirubin, UA: NEGATIVE
Blood, UA: NEGATIVE
Glucose, UA: NEGATIVE mg/dL
Ketones, POC UA: NEGATIVE mg/dL
Leukocytes, UA: NEGATIVE
Nitrite, UA: NEGATIVE
Protein Ur, POC: NEGATIVE mg/dL
Spec Grav, UA: <=1.005 — AB (ref 1.010–1.025)
Urobilinogen, UA: 0.2 U/dL
pH, UA: 5.5 (ref 5.0–8.0)

## 2024-07-19 NOTE — ED Triage Notes (Addendum)
 Patient states she had ringing in her ears x 6 months. Patient states she was told to get  a hearing aide. Patient states that it helped with the ringing, but at [facility] she continues to wear the hearing aide and states she was told to do so, but for the past month the noise has been too loud and she has sleepless nights. Patient also c/o body aches x 1 month.  Patient added that she has had a headache x 1 week.  Patient states she has been taking Tylenol  for the body aches.

## 2024-07-19 NOTE — ED Provider Notes (Signed)
 [facility]    CSN: [id number] Arrival date & time: 07/19/24  1026      History   Chief Complaint Chief Complaint  Patient presents with   Tinnitus   Generalized Body Aches    HPI Allison Franco is a 83 y.o. female.   Patient is here for headache.  She thinks this is due to ringing in her ears.  She is not sleeping well due tot he ringing in her ears.  She is also having body aches x 1 week. It is getting worse.   No fevers/chills.  No runny nose, congestion.  She has noted some nocturia, which is new for her.  However, she is taking lasix  for chronic LE edema.  She states she sleeps in a chair at times due to the ringing in her ears, which worsens the swelling.  No painful urination.  No cough.  No n/v.  Normal bowels.        Past Medical History:  Diagnosis Date   Anxiety    Constipation    Depression    following husband's death   Diabetes mellitus without complication (HCC)    Hypertension    KNEE PAIN, BILATERAL 02/13/2009   Qualifier: Diagnosis of  By: Gladis FNP, Delorise     Tinnitus     Patient Active Problem List   Diagnosis Date Noted   Encounter for annual health examination 03/11/2024   Abnormal EKG 03/11/2024   Type 2 diabetes mellitus with hyperlipidemia (HCC) 10/24/2023   Need for influenza vaccination 10/24/2023   COVID-19 vaccine administered 10/24/2023   Seasonal allergies 07/29/2023   Dysfunction of Eustachian tube, bilateral 07/29/2023   Obesity (BMI 30.0-34.9) 06/20/2023   Primary open angle glaucoma of both eyes, severe stage 06/20/2023   Facial rash 06/20/2023   Aortic atherosclerosis (HCC) 02/23/2023   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/23/2023   Constipation 02/14/2023   Unilateral primary osteoarthritis, left knee 05/18/2022   Inguinal hernia with bowel obstruction 08/07/2019   Incarcerated left inguinal hernia 08/07/2019   Sensorineural hearing loss (SNHL) of right ear with restricted hearing of  left ear 05/21/2019   Tinnitus of both ears 05/21/2019   Anemia 08/31/2018   Type 2 diabetes mellitus without complication, without long-term current use of insulin  (HCC) 07/02/2018   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      Home Medications    Prior to Admission medications   Medication Sig Start Date End Date Taking? Authorizing Provider  acetaminophen  (TYLENOL ) 500 MG tablet Take 1 tablet (500 mg total) by mouth every 4 (four) hours as needed. Patient not taking: Reported on 07/19/2024 02/21/23   Rising, Asberry, PA-C  albuterol  (VENTOLIN  HFA) 108 (90 Base) MCG/ACT inhaler Inhale 1-2 puffs into the lungs every 6 (six) hours as needed for wheezing or shortness of breath. 10/17/23   Myrna Camelia HERO, NP  Alcohol Swabs (ALCOHOL WIPES) 70 % PADS by Does not apply route. Use  with blood sugar check and injection of insulin     [provider]  Cholecalciferol  (VITAMIN D3) 25 MCG (1000 UT) CAPS Take 1 capsule (1,000 Units total) by mouth daily. 06/02/21   Moore, Janece, FNP  dorzolamide -timolol  (COSOPT ) 2-0.5 % ophthalmic solution Instill 1 drop into both eyes twice a day 01/25/23     dorzolamide -timolol  (COSOPT ) 2-0.5 % ophthalmic solution Instill 1 drop into both eyes twice a day 05/05/23     dorzolamide -timolol  (COSOPT ) 2-0.5 % ophthalmic solution Place 1 drop into both eyes 2 (two) times daily. 07/01/24     furosemide  (LASIX ) 20 MG tablet Take 1 tablet (20 mg total) by mouth every morning for 7 days.  06/17/24 06/25/24  Iola Lukes, FNP  glucose blood (ACCU-CHEK GUIDE) test strip TEST 3 TIMES A DAY 08/29/22   Georgina Speaks, FNP  MELATONIN PO Take by mouth.    [provider]  metFORMIN  (GLUCOPHAGE ) 500 MG tablet Take 1 tablet (500 mg total) by mouth daily with breakfast. 10/24/23   Georgina Speaks, FNP  Multiple Vitamins-Minerals (CENTRUM SILVER 50+WOMEN) TABS Take 1 tablet by mouth daily.    [provider]  olmesartan  (BENICAR ) 20 MG tablet Take 1 tablet (20 mg total) by mouth in the morning and at bedtime. 04/29/24   Georgina Speaks, FNP  omeprazole  (PRILOSEC) 20 MG capsule Take 1 capsule (20 mg total) by mouth daily. 06/17/24   Iola Lukes, FNP  potassium chloride  (KLOR-CON ) 10 MEQ tablet Take 1 tablet (10 mEq total) by mouth daily for 7 days. 06/17/24 06/25/24  Iola Lukes, FNP    Family History Family History  Problem Relation Age of Onset   Diabetes Mother    Hypertension Father    Breast cancer Neg Hx     Social History Social History   Tobacco Use   Smoking status: Never   Smokeless tobacco: Never  Vaping Use   Vaping status: Never Used  Substance Use Topics   Alcohol use: No   Drug use: No     Allergies   Aspirin , Hydroxyzine , and Valium  [diazepam ]   Review of Systems Review of Systems  Constitutional: Negative.   HENT:  Positive for tinnitus.   Respiratory: Negative.    Cardiovascular: Negative.   Gastrointestinal: Negative.   Musculoskeletal:  Positive for myalgias.  Neurological:  Positive for headaches.  Psychiatric/Behavioral: Negative.       Physical Exam Triage Vital Signs ED Triage Vitals  Encounter Vitals Group     BP 07/19/24 1046 118/76     Girls Systolic BP Percentile --      Girls Diastolic BP Percentile --      Boys Systolic BP Percentile --      Boys Diastolic BP Percentile --      Pulse Rate 07/19/24 1046 72     Resp 07/19/24 1046 16     Temp 07/19/24 1046 97.8 F (36.6 C)     Temp Source 07/19/24 1046  Oral     SpO2 07/19/24 1046 97 %     Weight --      Height --      Head Circumference --      Peak Flow --      Pain Score 07/19/24 1045 7     Pain Loc --      Pain Education --      Exclude from Growth Chart --    No data found.  Updated Vital Signs BP 118/76 (BP Location: Left Arm)   Pulse 72  Temp 97.8 F (36.6 C) (Oral)   Resp 16   SpO2 97%   Visual Acuity Right Eye Distance:   Left Eye Distance:   Bilateral Distance:    Right Eye Near:   Left Eye Near:    Bilateral Near:     Physical Exam Constitutional:      General: She is not in acute distress.    Appearance: Normal appearance. She is normal weight. She is not ill-appearing or toxic-appearing.  HENT:     Nose: Nose normal.     Mouth/Throat:     Mouth: Mucous membranes are moist.  Eyes:     Extraocular Movements: Extraocular movements intact.     Pupils: Pupils are equal, round, and reactive to light.  Cardiovascular:     Rate and Rhythm: Normal rate and regular rhythm.  Pulmonary:     Effort: Pulmonary effort is normal.     Breath sounds: Normal breath sounds.  Abdominal:     Palpations: Abdomen is soft.     Tenderness: There is no guarding or rebound.  Musculoskeletal:     Cervical back: Normal range of motion and neck supple. No tenderness.     Comments: 1+ edema bilaterally; no redness/warmth noted  Lymphadenopathy:     Cervical: No cervical adenopathy.  Skin:    General: Skin is warm.  Neurological:     General: No focal deficit present.     Mental Status: She is alert.  Psychiatric:        Mood and Affect: Mood normal.      UC Treatments / Results  Labs (all labs ordered are listed, but only abnormal results are displayed) Labs Reviewed  POCT URINALYSIS DIP (MANUAL ENTRY) - Abnormal; Notable for the following components:      Result Value   Spec Grav, UA <=1.005 (*)    All other components within normal limits  COMPREHENSIVE METABOLIC PANEL WITH GFR  CBC WITH  DIFFERENTIAL/PLATELET  POC SARS CORONAVIRUS 2 AG -  ED   Covid  negative  EKG   Radiology No results found.  Procedures Procedures (including critical care time)  Medications Ordered in UC Medications - No data to display  Initial Impression / Assessment and Plan / UC Course  I have reviewed the triage vital signs and the nursing notes.  Pertinent labs & imaging results that were available during my care of the patient were reviewed by me and considered in my medical decision making (see chart for details).  Final Clinical Impressions(s) / UC Diagnoses   Final diagnoses:  Other fatigue  Nocturia  Nonintractable headache, unspecified chronicity pattern, unspecified headache type  Body aches  Tinnitus, unspecified laterality     Discharge Instructions      You were seen today for headache and body aches.  Your covid swab was negative.  Your urine appears normal.  I have ordered blood work for further evaluation of your symptoms.  This will be resulted later today.  You will get a phone call if anything is abnormal/concerning.  You may take tylenol  for headache and body aches for now.  Please follow up with your doctor in terms of the ringing in your ears.  If you have worsening headache, body aches, or develop nausea, vomiting, fevers or chills then please go to the ER for further testing.     ED Prescriptions   None    PDMP not reviewed this encounter.   Darral Longs, MD 07/19/24 [phone]

## 2024-07-19 NOTE — Discharge Instructions (Addendum)
 You were seen today for headache and body aches.  Your covid swab was negative.  Your urine appears normal.  I have ordered blood work for further evaluation of your symptoms.  This will be resulted later today.  You will get a phone call if anything is abnormal/concerning.  You may take tylenol  for headache and body aches for now.  Please follow up with your doctor in terms of the ringing in your ears.  If you have worsening headache, body aches, or develop nausea, vomiting, fevers or chills then please go to the ER for further testing.

## 2024-07-22 ENCOUNTER — Other Ambulatory Visit (HOSPITAL_COMMUNITY): Payer: Self-pay

## 2024-07-22 ENCOUNTER — Emergency Department (HOSPITAL_COMMUNITY)

## 2024-07-22 ENCOUNTER — Encounter (HOSPITAL_COMMUNITY): Payer: Self-pay | Admitting: Emergency Medicine

## 2024-07-22 ENCOUNTER — Emergency Department (HOSPITAL_COMMUNITY)
Admission: EM | Admit: 2024-07-22 | Discharge: 2024-07-22 | Disposition: A | Discharge status: Home / Self Care | Attending: Emergency Medicine | Admitting: Emergency Medicine

## 2024-07-22 ENCOUNTER — Other Ambulatory Visit: Payer: Self-pay

## 2024-07-22 DIAGNOSIS — R519 Headache, unspecified: Secondary | ICD-10-CM | POA: Insufficient documentation

## 2024-07-22 DIAGNOSIS — I700 Atherosclerosis of aorta: Secondary | ICD-10-CM | POA: Diagnosis not present

## 2024-07-22 DIAGNOSIS — R600 Localized edema: Secondary | ICD-10-CM | POA: Insufficient documentation

## 2024-07-22 DIAGNOSIS — H9313 Tinnitus, bilateral: Secondary | ICD-10-CM | POA: Diagnosis not present

## 2024-07-22 DIAGNOSIS — R0602 Shortness of breath: Secondary | ICD-10-CM | POA: Insufficient documentation

## 2024-07-22 DIAGNOSIS — E119 Type 2 diabetes mellitus without complications: Secondary | ICD-10-CM | POA: Diagnosis not present

## 2024-07-22 DIAGNOSIS — I10 Essential (primary) hypertension: Secondary | ICD-10-CM | POA: Insufficient documentation

## 2024-07-22 DIAGNOSIS — F419 Anxiety disorder, unspecified: Secondary | ICD-10-CM | POA: Insufficient documentation

## 2024-07-22 DIAGNOSIS — R0600 Dyspnea, unspecified: Secondary | ICD-10-CM | POA: Insufficient documentation

## 2024-07-22 DIAGNOSIS — G479 Sleep disorder, unspecified: Secondary | ICD-10-CM | POA: Diagnosis not present

## 2024-07-22 DIAGNOSIS — M7989 Other specified soft tissue disorders: Secondary | ICD-10-CM | POA: Diagnosis not present

## 2024-07-22 LAB — CBG MONITORING, ED: Glucose-Capillary: 103 mg/dL — ABNORMAL HIGH (ref 70–99)

## 2024-07-22 LAB — BRAIN NATRIURETIC PEPTIDE: B Natriuretic Peptide: 34.8 pg/mL (ref 0.0–100.0)

## 2024-07-22 LAB — RESP PANEL BY RT-PCR (RSV, FLU A&B, COVID)  RVPGX2
Influenza A by PCR: NEGATIVE
Influenza B by PCR: NEGATIVE
Resp Syncytial Virus by PCR: NEGATIVE
SARS Coronavirus 2 by RT PCR: NEGATIVE

## 2024-07-22 LAB — COMPREHENSIVE METABOLIC PANEL WITH GFR
ALT: 26 U/L (ref 0–44)
AST: 27 U/L (ref 15–41)
Albumin: 3.5 g/dL (ref 3.5–5.0)
Alkaline Phosphatase: 82 U/L (ref 38–126)
Anion gap: 7 (ref 5–15)
BUN: 15 mg/dL (ref 8–23)
CO2: 26 mmol/L (ref 22–32)
Calcium: 9.1 mg/dL (ref 8.9–10.3)
Chloride: 102 mmol/L (ref 98–111)
Creatinine, Ser: 0.78 mg/dL (ref 0.44–1.00)
GFR, Estimated: >60 mL/min (ref >60)
Glucose, Bld: 98 mg/dL (ref 70–99)
Potassium: 4 mmol/L (ref 3.5–5.1)
Sodium: 135 mmol/L (ref 135–145)
Total Bilirubin: 0.8 mg/dL (ref 0.0–1.2)
Total Protein: 7.2 g/dL (ref 6.5–8.1)

## 2024-07-22 LAB — URINALYSIS, ROUTINE W REFLEX MICROSCOPIC
Bilirubin Urine: NEGATIVE
Glucose, UA: NEGATIVE mg/dL
Hgb urine dipstick: NEGATIVE
Ketones, ur: NEGATIVE mg/dL
Leukocytes,Ua: NEGATIVE
Nitrite: NEGATIVE
Protein, ur: NEGATIVE mg/dL
Specific Gravity, Urine: 1.006 (ref 1.005–1.030)
pH: 8 (ref 5.0–8.0)

## 2024-07-22 LAB — CBC
HCT: 40.2 % (ref 36.0–46.0)
Hemoglobin: 12.5 g/dL (ref 12.0–15.0)
MCH: 28.4 pg (ref 26.0–34.0)
MCHC: 31.1 g/dL (ref 30.0–36.0)
MCV: 91.4 fL (ref 80.0–100.0)
Platelets: 255 K/uL (ref 150–400)
RBC: 4.4 MIL/uL (ref 3.87–5.11)
RDW: 12 % (ref 11.5–15.5)
WBC: 5.1 K/uL (ref 4.0–10.5)
nRBC: 0 % (ref 0.0–0.2)

## 2024-07-22 LAB — TROPONIN I (HIGH SENSITIVITY): Troponin I (High Sensitivity): 15 ng/L (ref <18)

## 2024-07-22 NOTE — ED Notes (Signed)
 Pt ambulated without assistance, O2 stayed at 100%

## 2024-07-22 NOTE — ED Notes (Signed)
 Patient transported to x-ray. ?

## 2024-07-22 NOTE — ED Triage Notes (Addendum)
 Pt here for ongoing symptoms same as visit on 07/19/24.  BL LE edema, SOB, Gen weakness, Bodyaches, nocturia, not sleeping well, ringing in ears. Pt had lab work done but doesn't know the results.

## 2024-07-22 NOTE — ED Provider Notes (Signed)
 83 y/o female w/ hx chronic edema, HTN, tennitus, hearing loss, DM, other chronic conditions noted in chart here with primary concern for tinnitus and leg swelling. She was recently started on lasix  and reports her tinnitus has gotten worse since then but she does report the leg swelling is improved. She has trace / 1+ pitting edema b/l LE symmetric, lung clear and she has no respiratory complaints or hypoxia. Does not appear to have acute chf exacerbation. Her ears appear grossly normal, tm intact, no mastoid ttp or evidence of infection. She is well appearing overall. Her workup here is stable. Favor her tinnitus has been exacerbated by the lasix ; pt reports completed lasix .  Recommend she use compression stockings and elevation help reduce the swelling in her legs, eat low-salt diet.  Close follow-up PCP regarding leg swelling.  Avoid Lasix  as it seems to have exacerbated her tinnitus.  Patient has had tinnitus chronically, she is requesting ENT follow-up, provided on call ENT   1:29 PM:  I have discussed the diagnosis/risks/treatment options with the patient.  Evaluation and diagnostic testing in the emergency department does not suggest an emergent condition requiring admission or immediate intervention beyond what has been performed at this time.  They will follow up with PCP, ENT. We also discussed returning to the ED immediately if new or worsening sx occur. We discussed the sx which are most concerning (e.g., sudden worsening pain, fever, inability to tolerate by mouth , chest pain, dyspnea, hearing loss, numbness/weakness of extremities, etc.) that necessitate immediate return.    The patient appears reasonably screen and/or stabilized for discharge and I doubt any other medical condition or other [facility] requiring further screening, evaluation, or treatment in the ED at this time prior to discharge.        Elnor Savant A, DO 07/22/24 1330

## 2024-07-22 NOTE — ED Provider Notes (Signed)
 [facility] Emergency Department Provider Note MRN:  [id number]  Arrival date & time: 07/22/24     Chief Complaint   multiple complaints   History of Present Illness    Patient is an 83 year old woman with past medical history of hypertension, type 2 diabetes, and anxiety who presents with tinnitus, sleep disturbance, headache, and bilateral lower extremity swelling. She reports a one-year history of bilateral tinnitus, worse on the right, associated with progressive hearing loss for which she uses hearing aids. The tinnitus is constant, worsens at night, and interferes with her sleep, causing restlessness and headaches. She denies dizziness, vertigo, nausea, vomiting, or falls.  She also reports bilateral leg swelling that began about one month ago. She was started on furosemide  approximately one week ago with partial improvement. She attributes some of the swelling to positional factors, noting it is worse when sitting on the couch with her legs dependent. She denies chest pain, orthopnea, or paroxysmal nocturnal dyspnea, but endorses stable exertional shortness of breath with prolonged walking.  On presentation, her vital signs were stable, except for mildly elevated blood pressure at 159/68. Laboratory studies including electrolytes and WBC count were normal; BNP was 34. Chest X-ray showed no acute findings. EKG was within normal limits  Location: Ears (tinnitus/hearing loss), legs (edema), head (headache) Duration: Tinnitus 1 yr, edema 1 mo, headache/sleep disturbance ongoing Onset: Gradual, progressive Timing: Tinnitus constant (worse at night), edema positional, dyspnea exertional Description: Persistent ringing with hearing loss, bilateral swelling, headache from noise Severity: Tinnitus severe (affects sleep), edema mild-moderate Exacerbating/Alleviating: Tinnitus worse at night, edema better with Lasix /elevation Associated Symptoms: Sleep disturbance,  headache, exertional SOB Pertinent Negatives: No vertigo, dizziness, chest pain, orthopnea, PND Additional History: HTN, DM2, anxiety; meds include metformin , omeprazole , Ventolin , Lasix   Review of Systems   Constitutional: Negative.   HENT:  Positive for tinnitus.   Respiratory: Negative.    Cardiovascular: Negative.   Gastrointestinal: Negative.   Musculoskeletal:  Positive for myalgias.  Neurological:  Positive for headaches.  Psychiatric/Behavioral: Negative.     Patient's Health History    Past Medical History:  Diagnosis Date   Anxiety    Constipation    Depression    following husband's death   Diabetes mellitus without complication (HCC)    Hypertension    KNEE PAIN, BILATERAL 02/13/2009   Qualifier: Diagnosis of  By: Gladis FNP, Delorise     Tinnitus     Past Surgical History:  Procedure Laterality Date   ABDOMINAL HYSTERECTOMY     COLON RESECTION N/A 08/07/2019   Procedure: DIAGNOSTIC LAPAROSCOPY WITH OPEN LEFT INGUINAL HERNIA WITH MESH;  Surgeon: Gladis Cough, MD;  Location: [facility];  Service: General;  Laterality: N/A;   fribroid surgery      Family History  Problem Relation Age of Onset   Diabetes Mother    Hypertension Father    Breast cancer Neg Hx     Social History   Socioeconomic History   Marital status: Widowed    Spouse name: Not on file   Number of children: 0   Years of education: 25   Highest education level: Not on file  Occupational History   Occupation: employed  Tobacco Use   Smoking status: Never   Smokeless tobacco: Never  Vaping Use   Vaping status: Never Used  Substance and Sexual Activity   Alcohol use: No   Drug use: No   Sexual activity: Not Currently  Other Topics Concern   Not on file  Social  History Narrative   Lives alone, husband passed away 2 1/2 years ago   Caffeine use- none   Social Drivers of Corporate investment banker Strain: Low Risk  (01/03/2024)   Overall Financial Resource Strain (CARDIA)     Difficulty of Paying Living Expenses: Not hard at all  Food Insecurity: No Food Insecurity (01/03/2024)   Hunger Vital Sign    Worried About Running Out of Food in the Last Year: Never true    Ran Out of Food in the Last Year: Never true  Transportation Needs: No Transportation Needs (01/03/2024)   PRAPARE - Administrator, Civil Service (Medical): No    Lack of Transportation (Non-Medical): No  Physical Activity: Insufficiently Active (01/03/2024)   Exercise Vital Sign    Days of Exercise per Week: 7 days    Minutes of Exercise per Session: 10 min  Stress: No Stress Concern Present (01/03/2024)   [employer] of Occupational Health - Occupational Stress Questionnaire    Feeling of Stress : Not at all  Social Connections: Socially Isolated (01/03/2024)   Social Connection and Isolation Panel    Frequency of Communication with Friends and Family: Once a week    Frequency of Social Gatherings with Friends and Family: Never    Attends Religious Services: More than 4 times per year    Active Member of [employer] or Organizations: No    Attends Banker Meetings: Never    Marital Status: Widowed  Intimate Partner Violence: Not At Risk (01/03/2024)   Humiliation, Afraid, Rape, and Kick questionnaire    Fear of Current or Ex-Partner: No    Emotionally Abused: No    Physically Abused: No    Sexually Abused: No     Physical Exam   Vitals:   07/22/24 0752  BP: (!) 159/68  Pulse: 65  Resp: 18  Temp: 97.7 F (36.5 C)  SpO2: 96%    CONSTITUTIONAL:  well-appearing, NAD NEURO:  Alert and oriented x 3, no focal deficits EYES:  eyes equal and reactive ENT/NECK:  no LAD, no JVD CARDIO:  normal rate, well-perfused, normal S1 and S2 PULM:  CTAB no wheezing or rhonchi GI/GU:  normal bowel sounds, non-distended, non-tender MSK/SPINE:  No gross deformities, no edema SKIN:  no rash, atraumatic PSYCH:  Appropriate speech and behavior  *Additional and/or pertinent findings  included in MDM below  Diagnostic and Interventional Summary    EKG Interpretation Date/Time:  Monday July 22 2024 08:17:54 EDT Ventricular Rate:  66 PR Interval:  202 QRS Duration:  80 QT Interval:  370 QTC Calculation: 387 R Axis:   13  Text Interpretation: Normal sinus rhythm Possible Left atrial enlargement Borderline ECG When compared with ECG of 09-Jul-2022 19:20, PREVIOUS ECG IS PRESENT Confirmed by Elnor Savant ([id number]) on 07/22/2024 8:51:45 AM       Labs Reviewed  URINALYSIS, ROUTINE W REFLEX MICROSCOPIC - Abnormal; Notable for the following components:      Result Value   Color, Urine STRAW (*)    All other components within normal limits  CBG MONITORING, ED - Abnormal; Notable for the following components:   Glucose-Capillary 103 (*)    All other components within normal limits  RESP PANEL BY RT-PCR (RSV, FLU A&B, COVID)  [id number]  COMPREHENSIVE METABOLIC PANEL WITH GFR  CBC  BRAIN NATRIURETIC PEPTIDE  TROPONIN I (HIGH SENSITIVITY)    DG Chest 2 View  Final Result      Medications - No data to  display   Procedures  /  Critical Care Procedures  ED Course and Medical Decision Making  I have reviewed the triage vital signs, the nursing notes, and pertinent available records from the EMR.  Listed above are laboratory and imaging tests that I personally ordered, reviewed, and interpreted and then considered in my medical decision making (see below for details): CBC, CT MP, BMP, urinalysis, respiratory panel, troponin, EKG, chest x-ray  #Tinnitus with Hearing Loss, Sleep Disturbance, and Headache The patient reports a one-year history of bilateral tinnitus, worse on the right, associated with progressive hearing loss requiring hearing aids. She denies vertigo, imbalance, nausea, vomiting, or recent trauma. The persistent tinnitus has led to significant sleep disturbance and secondary headaches, described as being triggered by the constant noise. Given her age,  chronicity, and absence of vertigo, this is less consistent with acute Mnire's disease. Electrolytes and EKG are unremarkable. No acute intracranial process is suspected at this time.  Patient endorsed that after taking Lasix , noticed more tinnitus, could be a side effect of the medication. Plan: -Referral to ENT for further evaluation (audiology testing) -Hold Lasix   #Bilateral Lower Extremity Edema The patient endorses progressive bilateral lower extremity swelling over the past month. She was started on furosemide  a week ago with partial relief. BNP is normal (34), chest X-ray is without acute findings, and there is no report of orthopnea or paroxysmal nocturnal dyspnea. Past echocardiogram from 2014 showed preserved EF with grade 1 diastolic dysfunction. Physical exam today shows no evidence of acute decompensated heart failure. Given the normal BNP and stable vitals, this appears less likely to represent acute volume overload. Positional edema and venous insufficiency remain strong considerations. Plan: -Encourage leg elevation and compression stockings if tolerated.  -Outpatient follow-up with cardiology for a repeat echocardiogram given her age and comorbidities.   #Anxiety and Sleep Disturbance The patient reports restlessness and difficulty sleeping, which appear to be exacerbated by her tinnitus. Her anxiety may contribute to symptom amplification and poor quality of life. Plan:  -Provide reassurance -Consider behavioral strategies for sleep hygiene  Samson Ralph, MD  Final Clinical Impressions(s) / ED Diagnoses  No diagnosis found.  ED Discharge Orders     None        Discharge Instructions Discussed with and Provided to Patient:   Discharge Instructions   None        Bernadine Manos, MD 07/31/24 1606    Elnor Savant A, DO 08/01/24 1542

## 2024-07-22 NOTE — Discharge Instructions (Addendum)
 It was a pleasure caring for you today in the emergency department.  Recommend you eat a low-salt diet.  You can go to your local drugstore and purchase some compression stockings which should help with the swelling.  Keep your legs elevated when sitting or lying down.  Avoid Lasix  in the future as this can make your ringing in the ears worse. Please follow up with ENT.   I would also consider purchasing a noise machine or using a fan in your bedroom to help make the ringing sound less noticeable.    There are over-the-counter sleep aids available including chamomile tea, melatonin which can help improve your sleep quality  Please return to the emergency department for any worsening or worrisome symptoms.

## 2024-07-22 NOTE — ED Notes (Signed)
 Per MD patient can have something to eat and drink

## 2024-08-01 ENCOUNTER — Other Ambulatory Visit (HOSPITAL_COMMUNITY): Payer: Self-pay

## 2024-08-01 ENCOUNTER — Other Ambulatory Visit: Payer: Self-pay | Admitting: Nurse Practitioner

## 2024-08-01 DIAGNOSIS — E1169 Type 2 diabetes mellitus with other specified complication: Secondary | ICD-10-CM

## 2024-08-01 MED ORDER — METFORMIN HCL 500 MG PO TABS
500.0000 mg | ORAL_TABLET | Freq: Every day | ORAL | 1 refills | Status: DC
  Filled 2024-08-01: qty 90, 90d supply, fill #0
  Filled 2024-11-05: qty 90, 90d supply, fill #1

## 2024-08-03 ENCOUNTER — Other Ambulatory Visit: Payer: Self-pay

## 2024-08-03 ENCOUNTER — Encounter (HOSPITAL_COMMUNITY): Payer: Self-pay | Admitting: *Deleted

## 2024-08-03 ENCOUNTER — Emergency Department (HOSPITAL_COMMUNITY)

## 2024-08-03 ENCOUNTER — Emergency Department (HOSPITAL_COMMUNITY)
Admission: EM | Admit: 2024-08-03 | Discharge: 2024-08-03 | Disposition: A | Discharge status: Home / Self Care | Attending: Emergency Medicine | Admitting: Emergency Medicine

## 2024-08-03 DIAGNOSIS — R2243 Localized swelling, mass and lump, lower limb, bilateral: Secondary | ICD-10-CM | POA: Diagnosis present

## 2024-08-03 DIAGNOSIS — Z79899 Other long term (current) drug therapy: Secondary | ICD-10-CM | POA: Insufficient documentation

## 2024-08-03 DIAGNOSIS — I10 Essential (primary) hypertension: Secondary | ICD-10-CM | POA: Insufficient documentation

## 2024-08-03 DIAGNOSIS — R600 Localized edema: Secondary | ICD-10-CM | POA: Insufficient documentation

## 2024-08-03 DIAGNOSIS — Z7984 Long term (current) use of oral hypoglycemic drugs: Secondary | ICD-10-CM | POA: Insufficient documentation

## 2024-08-03 DIAGNOSIS — E119 Type 2 diabetes mellitus without complications: Secondary | ICD-10-CM | POA: Insufficient documentation

## 2024-08-03 DIAGNOSIS — M7989 Other specified soft tissue disorders: Secondary | ICD-10-CM | POA: Diagnosis not present

## 2024-08-03 LAB — BASIC METABOLIC PANEL WITH GFR
Anion gap: 10 (ref 5–15)
BUN: 17 mg/dL (ref 8–23)
CO2: 22 mmol/L (ref 22–32)
Calcium: 8.7 mg/dL — ABNORMAL LOW (ref 8.9–10.3)
Chloride: 103 mmol/L (ref 98–111)
Creatinine, Ser: 0.9 mg/dL (ref 0.44–1.00)
GFR, Estimated: >60 mL/min (ref >60)
Glucose, Bld: 125 mg/dL — ABNORMAL HIGH (ref 70–99)
Potassium: 4.2 mmol/L (ref 3.5–5.1)
Sodium: 135 mmol/L (ref 135–145)

## 2024-08-03 LAB — I-STAT CHEM 8, ED
BUN: 19 mg/dL (ref 8–23)
Calcium, Ion: 1.13 mmol/L — ABNORMAL LOW (ref 1.15–1.40)
Chloride: 102 mmol/L (ref 98–111)
Creatinine, Ser: 1 mg/dL (ref 0.44–1.00)
Glucose, Bld: 128 mg/dL — ABNORMAL HIGH (ref 70–99)
HCT: 40 % (ref 36.0–46.0)
Hemoglobin: 13.6 g/dL (ref 12.0–15.0)
Potassium: 4.2 mmol/L (ref 3.5–5.1)
Sodium: 137 mmol/L (ref 135–145)
TCO2: 23 mmol/L (ref 22–32)

## 2024-08-03 LAB — CBC
HCT: 40.1 % (ref 36.0–46.0)
Hemoglobin: 12.3 g/dL (ref 12.0–15.0)
MCH: 28.7 pg (ref 26.0–34.0)
MCHC: 30.7 g/dL (ref 30.0–36.0)
MCV: 93.7 fL (ref 80.0–100.0)
Platelets: 243 K/uL (ref 150–400)
RBC: 4.28 MIL/uL (ref 3.87–5.11)
RDW: 12.3 % (ref 11.5–15.5)
WBC: 6 K/uL (ref 4.0–10.5)
nRBC: 0 % (ref 0.0–0.2)

## 2024-08-03 LAB — BRAIN NATRIURETIC PEPTIDE: B Natriuretic Peptide: 50.6 pg/mL (ref 0.0–100.0)

## 2024-08-03 LAB — MAGNESIUM: Magnesium: 2 mg/dL (ref 1.7–2.4)

## 2024-08-03 MED ORDER — FUROSEMIDE 10 MG/ML IJ SOLN
20.0000 mg | Freq: Once | INTRAMUSCULAR | Status: AC
  Administered 2024-08-03: 20 mg via INTRAVENOUS
  Filled 2024-08-03: qty 2

## 2024-08-03 MED ORDER — FUROSEMIDE 10 MG/ML IJ SOLN: 40.0000 mg | Freq: Once | INTRAMUSCULAR | Status: DC

## 2024-08-03 NOTE — ED Notes (Signed)
 Patient was ambulated from room 09 to the charge desk and back after standing about to be changed. Patients 02 sat remained 95%-97%. Patient stated he had no SB dizziness or CP

## 2024-08-03 NOTE — ED Provider Notes (Signed)
 [place] [facility] Provider Note   CSN: [id number] Arrival date & time: 08/03/24  1020     Patient presents with: Leg Swelling   Allison Franco is a 83 y.o. female with past medical history of venous insufficiency, type 2 diabetes, eustachian tube dysfunction, hypertension presents to emergency room with complaint of bilateral lower extremity swelling.  Patient reports she has been intermittently dealing with this for approximately 1 year but has worsened over the past 1 month.  She reports she has had some improvement after taking Lasix  but she was told to stop it due to tinnitus.  She notes some shortness of breath but only with ambulation as well as worsening lower extremity discomfort with ambulation.  She denies any shortness of breath at rest, orthopnea.  She denies any chest pain.  No fever or cough.   HPI     Prior to Admission medications   Medication Sig Start Date End Date Taking? Authorizing Provider  acetaminophen  (TYLENOL ) 500 MG tablet Take 1 tablet (500 mg total) by mouth every 4 (four) hours as needed. Patient not taking: Reported on 07/19/2024 02/21/23   Rising, Asberry, PA-C  albuterol  (VENTOLIN  HFA) 108 (90 Base) MCG/ACT inhaler Inhale 1-2 puffs into the lungs every 6 (six) hours as needed for wheezing or shortness of breath. 10/17/23   Myrna Camelia HERO, NP  Alcohol Swabs (ALCOHOL WIPES) 70 % PADS by Does not apply route. Use with blood sugar check and injection of insulin     [provider]  Cholecalciferol  (VITAMIN D3) 25 MCG (1000 UT) CAPS Take 1 capsule (1,000 Units total) by mouth daily. 06/02/21   Moore, Janece, FNP  dorzolamide -timolol  (COSOPT ) 2-0.5 % ophthalmic solution Instill 1 drop into both eyes twice a day 01/25/23     dorzolamide -timolol  (COSOPT ) 2-0.5 % ophthalmic solution Instill 1 drop into both eyes twice a day 05/05/23     dorzolamide -timolol  (COSOPT ) 2-0.5 % ophthalmic solution Place 1 drop into both eyes 2 (two)  times daily. 07/01/24     furosemide  (LASIX ) 20 MG tablet Take 1 tablet (20 mg total) by mouth every morning for 7 days. 06/17/24 06/25/24  Iola Lukes, FNP  glucose blood (ACCU-CHEK GUIDE) test strip TEST 3 TIMES A DAY 08/29/22   Georgina Speaks, FNP  MELATONIN PO Take by mouth.    [provider]  metFORMIN  (GLUCOPHAGE ) 500 MG tablet Take 1 tablet (500 mg total) by mouth daily with breakfast. 08/01/24   Georgina Speaks, FNP  Multiple Vitamins-Minerals (CENTRUM SILVER 50+WOMEN) TABS Take 1 tablet by mouth daily.    [provider]  olmesartan  (BENICAR ) 20 MG tablet Take 1 tablet (20 mg total) by mouth in the morning and at bedtime. 04/29/24   Georgina Speaks, FNP  omeprazole  (PRILOSEC) 20 MG capsule Take 1 capsule (20 mg total) by mouth daily. 06/17/24   Murrill, Samantha, FNP  potassium chloride  (KLOR-CON ) 10 MEQ tablet Take 1 tablet (10 mEq total) by mouth daily for 7 days. 06/17/24 06/25/24  Iola Lukes, FNP    Allergies: Aspirin , Hydroxyzine , and Valium  [diazepam ]    Review of Systems  Cardiovascular:  Positive for leg swelling.    Updated Vital Signs BP 132/60 (BP Location: Right Arm)   Pulse 72   Temp 97.8 F (36.6 C)   Resp (!) 21   Ht 5' 3 (1.6 m)   Wt 79.4 kg   SpO2 100%   BMI 31.00 kg/m   Physical Exam Vitals and nursing note reviewed.  Constitutional:      General: She is not in acute distress.    Appearance: She is not toxic-appearing.  HENT:     Head: Normocephalic and atraumatic.  Eyes:     General: No scleral icterus.    Conjunctiva/sclera: Conjunctivae normal.  Cardiovascular:     Rate and Rhythm: Normal rate and regular rhythm.     Pulses: Normal pulses.     Heart sounds: Normal heart sounds.  Pulmonary:     Effort: Pulmonary effort is normal. No respiratory distress.     Breath sounds: Normal breath sounds.  Abdominal:     General: Abdomen is flat. Bowel sounds are normal.     Palpations: Abdomen is soft.     Tenderness: There is no  abdominal tenderness.  Musculoskeletal:     Right lower leg: Edema present.     Left lower leg: Edema present.  Skin:    General: Skin is warm and dry.     Findings: No lesion.  Neurological:     General: No focal deficit present.     Mental Status: She is alert and oriented to person, place, and time. Mental status is at baseline.     (all labs ordered are listed, but only abnormal results are displayed) Labs Reviewed  BASIC METABOLIC PANEL WITH GFR - Abnormal; Notable for the following components:      Result Value   Glucose, Bld 125 (*)    Calcium 8.7 (*)    All other components within normal limits  I-STAT CHEM 8, ED - Abnormal; Notable for the following components:   Glucose, Bld 128 (*)    Calcium, Ion 1.13 (*)    All other components within normal limits  MAGNESIUM  BRAIN NATRIURETIC PEPTIDE  CBC    EKG: None  Radiology: DG Chest 1 View Result Date: 08/03/2024 CLINICAL DATA:  Chronic lower extremity swelling. EXAM: CHEST  1 VIEW COMPARISON:  July 22, 2024. FINDINGS: The heart size and mediastinal contours are within normal limits. Both lungs are clear. The visualized skeletal structures are unremarkable. IMPRESSION: No active disease. Electronically Signed   By: Lynwood Landy Raddle M.D.   On: 08/03/2024 11:09     Procedures   Medications Ordered in the ED  furosemide  (LASIX ) injection 20 mg (has no administration in time range)    Clinical Course as of 08/03/24 1440  Sat Aug 03, 2024  1439 Ambulated very well, no SOB, normal O2 sats.   [JB]    Clinical Course User Index [JB] Bartlett Enke, Warren SAILOR, PA-C                                 Medical Decision Making Amount and/or Complexity of Data Reviewed Labs: ordered.  Risk Prescription drug management.   This patient presents to the ED for concern of shortness of breath, this involves an extensive number of treatment options, and is a complaint that carries with it a high risk of complications and morbidity.   The differential diagnosis includes CHF, PE, pneumonia, pneumothorax, pulmonary embolus, asthma, COPD   Co morbidities that complicate the patient evaluation  Hypertension Tinnitus DM   Additional history obtained:  Additional history obtained from recent ED visit 07/22/2024 in which patient was seen for tinnitus and recommended to slop lasix  due to potential side effect for medication.    Lab Tests:  I personally interpreted labs.  The pertinent results include:   No  leukocytosis.  No anemia.  BMP with no electrolyte abnormality.  Magnesium is 2, BNP 50   Imaging Studies ordered:  I ordered imaging studies including chest x-ray I independently visualized and interpreted imaging which showed no acute finding I agree with the radiologist interpretation   Cardiac Monitoring: / EKG:  The patient was maintained on a cardiac monitor.   Will ambulate with pulse ox.   Problem List / ED Course / Critical interventions / Medication management  Patient presents emergency room with complaint of bilateral lower extremity swelling which has been ongoing for approximately a year.  Her swelling has increased after stopping her Lasix .  She does note some shortness of breath.  On my exam her vital signs are stable she is not hypoxic and in no acute distress.  Will ambulate with pulse ox.  Her chest x-ray shows no vascular congestion.  Her BNP is not elevated.  Her lab work is overall reassuring.  She has no sign of cellulitis on exam.  She is neurovascularly intact. I ordered medication including Lasix  20mg  Reevaluation of the patient after these medicines showed that the patient improved I have reviewed the patients home medicines and have made adjustments as needed Will trial low dose diuretic.  Hemodynamically stable throughout stay.  Feel appropriate for discharge with outpatient follow-up.      Final diagnoses:  Peripheral edema    ED Discharge Orders     None           Shermon Warren SAILOR, PA-C 08/03/24 1440    Dean Clarity, MD 08/03/24 1450

## 2024-08-03 NOTE — ED Triage Notes (Signed)
 Patient c/o swelling to her lower  ext. Onset 1 year ago, states it is getting worse, States she wasn't able to sleep last pm

## 2024-08-03 NOTE — Discharge Instructions (Addendum)
 Please call your family doctor to schedule follow up for today's visit.  You can trail 10mg  of Lasix  daily, at home.  I would also recommend obtaining compression stockings, elevate and watch sodium intake in your diet. Return to ER with worsening symptoms.

## 2024-08-03 NOTE — ED Notes (Signed)
 Paper work reviewed with pt. Pt will call family member for pick up in lobby. No questions or acute onset distress at this time.

## 2024-08-15 ENCOUNTER — Emergency Department (HOSPITAL_COMMUNITY)
Admission: EM | Admit: 2024-08-15 | Discharge: 2024-08-15 | Discharge status: Against Medical Advice | Attending: Emergency Medicine | Admitting: Emergency Medicine

## 2024-08-15 ENCOUNTER — Other Ambulatory Visit: Payer: Self-pay | Admitting: Nurse Practitioner

## 2024-08-15 ENCOUNTER — Other Ambulatory Visit (HOSPITAL_COMMUNITY): Payer: Self-pay

## 2024-08-15 ENCOUNTER — Other Ambulatory Visit: Payer: Self-pay

## 2024-08-15 DIAGNOSIS — Z5321 Procedure and treatment not carried out due to patient leaving prior to being seen by health care provider: Secondary | ICD-10-CM | POA: Insufficient documentation

## 2024-08-15 DIAGNOSIS — R451 Restlessness and agitation: Secondary | ICD-10-CM | POA: Diagnosis not present

## 2024-08-15 DIAGNOSIS — G479 Sleep disorder, unspecified: Secondary | ICD-10-CM

## 2024-08-15 DIAGNOSIS — I119 Hypertensive heart disease without heart failure: Secondary | ICD-10-CM

## 2024-08-15 NOTE — ED Notes (Signed)
 Alert, NAD, calm, interactive, resps e/u, speaking in clear complete sentences. Prefers to sit in chair.

## 2024-08-15 NOTE — ED Provider Notes (Signed)
 Patient left the ED prior to my evaluation. I never spoke to her or evaluated her.    Darra Fonda MATSU, MD 08/15/24 [phone]

## 2024-08-15 NOTE — ED Notes (Signed)
 Pt left AMA. Pt stated that she is hungry and wants to go home. Primary RN aware.

## 2024-08-15 NOTE — ED Notes (Signed)
 Patient states to RN that she has ringing in her ears and her head hurts. Patient also states that she is restless and cannot sleep and wants to be seen.

## 2024-08-15 NOTE — ED Triage Notes (Signed)
 Pt. Stated, Allison Franco not been able to sleep in a year.

## 2024-08-16 ENCOUNTER — Encounter: Payer: Self-pay | Admitting: Family Medicine

## 2024-08-16 ENCOUNTER — Other Ambulatory Visit (HOSPITAL_COMMUNITY): Payer: Self-pay

## 2024-08-16 ENCOUNTER — Ambulatory Visit: Payer: Self-pay | Admitting: Family Medicine

## 2024-08-16 ENCOUNTER — Other Ambulatory Visit: Payer: Self-pay

## 2024-08-16 VITALS — BP 110/70 | HR 74 | Temp 98.1°F | Ht 63.0 in | Wt 183.8 lb

## 2024-08-16 DIAGNOSIS — E785 Hyperlipidemia, unspecified: Secondary | ICD-10-CM

## 2024-08-16 DIAGNOSIS — I700 Atherosclerosis of aorta: Secondary | ICD-10-CM

## 2024-08-16 DIAGNOSIS — E1169 Type 2 diabetes mellitus with other specified complication: Secondary | ICD-10-CM | POA: Diagnosis not present

## 2024-08-16 DIAGNOSIS — E6609 Other obesity due to excess calories: Secondary | ICD-10-CM

## 2024-08-16 DIAGNOSIS — E66811 Obesity, class 1: Secondary | ICD-10-CM

## 2024-08-16 DIAGNOSIS — Z6832 Body mass index (BMI) 32.0-32.9, adult: Secondary | ICD-10-CM

## 2024-08-16 DIAGNOSIS — H9313 Tinnitus, bilateral: Secondary | ICD-10-CM | POA: Diagnosis not present

## 2024-08-16 DIAGNOSIS — R600 Localized edema: Secondary | ICD-10-CM

## 2024-08-16 DIAGNOSIS — I119 Hypertensive heart disease without heart failure: Secondary | ICD-10-CM | POA: Diagnosis not present

## 2024-08-16 MED ORDER — OLMESARTAN MEDOXOMIL 20 MG PO TABS
20.0000 mg | ORAL_TABLET | Freq: Two times a day (BID) | ORAL | 1 refills | Status: DC
  Filled 2024-08-16 – 2024-09-23 (×2): qty 90, 45d supply, fill #0
  Filled 2024-11-05: qty 90, 45d supply, fill #1

## 2024-08-16 NOTE — Patient Instructions (Signed)
 Edema  Edema is when you have too much fluid in your body or under your skin. Edema may make your legs, feet, and ankles swell. Swelling often happens in looser tissues, such as around your eyes. This is a common condition. It gets more common as you get older. There are many possible causes of edema. These include: Eating too much salt (sodium). Being on your feet or sitting for a long time. Certain medical conditions, such as: Pregnancy. Heart failure. Liver disease. Kidney disease. Cancer. Hot weather may make edema worse. Edema is usually painless. Your skin may look swollen or shiny. Follow these instructions at home: Medicines Take over-the-counter and prescription medicines only as told by your doctor. Your doctor may prescribe a medicine to help your body get rid of extra water (diuretic). Take this medicine if you are told to take it. Eating and drinking Eat a low-salt (low-sodium) diet as told by your doctor. Sometimes, eating less salt may reduce swelling. Depending on the cause of your swelling, you may need to limit how much fluid you drink (fluid restriction). General instructions Raise the injured area above the level of your heart while you are sitting or lying down. Do not sit still or stand for a long time. Do not wear tight clothes. Do not wear garters on your upper legs. Exercise your legs. This can help the swelling go down. Wear compression stockings as told by your doctor. It is important that these are the right size. These should be prescribed by your doctor to prevent possible injuries. If elastic bandages or wraps are recommended, use them as told by your doctor. Contact a doctor if: Treatment is not working. You have heart, liver, or kidney disease and have symptoms of edema. You have sudden and unexplained weight gain. Get help right away if: You have shortness of breath or chest pain. You cannot breathe when you lie down. You have pain, redness, or  warmth in the swollen areas. You have heart, liver, or kidney disease and get edema all of a sudden. You have a fever and your symptoms get worse all of a sudden. These symptoms may be an emergency. Get help right away. Call 911. Do not wait to see if the symptoms will go away. Do not drive yourself to the hospital. Summary Edema is when you have too much fluid in your body or under your skin. Edema may make your legs, feet, and ankles swell. Swelling often happens in looser tissues, such as around your eyes. Raise the injured area above the level of your heart while you are sitting or lying down. Follow your doctor's instructions about diet and how much fluid you can drink. This information is not intended to replace advice given to you by your health care provider. Make sure you discuss any questions you have with your health care provider. Document Revised: 07/19/2021 Document Reviewed: 07/19/2021 Elsevier Patient Education  2024 [employer].

## 2024-08-16 NOTE — Progress Notes (Signed)
 ,Richerd ONEIDA Lemmings, CMA,acting as a Neurosurgeon for Bruna Creighton, NP.,have documented all relevant documentation on the behalf of Bruna Creighton, NP,as directed by  Bruna Creighton, NP while in the presence of Bruna Creighton, NP.  Subjective:  Patient ID: Allison Franco , female    DOB: 04/02/1941 , 83 y.o.   MRN: [id number]  Chief Complaint  Patient presents with   Follow-up    Patient presents today for ED follow up. She has visited [facility] ED on 8/25 for Tinnitus of both ears & 9/06 for Peripheral edema. Today she reports feeling bad. She has pain all over her body. She went to the ED yesterday & they were mean to her. So she left.    HPI Discussed the use of AI scribe software for clinical note transcription with the patient, who gave verbal consent to proceed.  History of Present Illness       Allison Franco is an 83 year old female who presents with worsening ear symptoms.  She has chronic tinnitus that began after the death of her husband several years ago. The ringing is described as similar to 'wind blowing or raining' and becomes particularly loud and bothersome in quiet environments, leading to sleepless nights and restlessness. Hearing aids provide some relief, but the noise sometimes overcomes them, causing restlessness. She is afraid to wear the hearing aids outside due to the risk of them falling off, especially in windy conditions.  She has consulted an ear, nose, and throat specialist and received hearing aids, but finds them not completely effective. No dizziness is associated with the tinnitus.  She stopped taking aspirin , suspecting it might contribute to her symptoms, although she is unsure which medication might be causing the issue. She is currently taking metformin  for diabetes.  Her other medical conditions include hypertension, diabetes with hyperlipidemia, peripheral edema and she has been advised to wear compression stockings. She states that she wears compression stockings at  home but not when going out.  Discussed counseling with patient as a resource for her chronic tinnitus, she voiced understanding and agreed, stating that she was looking forward to it.  Collaboration of Care: Referral or follow-up with counselor/therapist AEB   Patient/Guardian was advised Release of Information must be obtained prior to any record release in order to collaborate their care with an outside provider. Patient/Guardian was advised if they have not already done so to contact the registration department to sign all necessary forms in order for us  to release information regarding their care.   Consent: Patient/Guardian gives verbal consent for treatment and assignment of benefits for services provided during this visit. Patient/Guardian expressed understanding and agreed to proceed.    She is encouraged to strive for BMI less than 30 to decrease cardiac risk. Advised to aim for at least 150 minutes of exercise per week.       Past Medical History:  Diagnosis Date   Anxiety    Constipation    Depression    following husband's death   Diabetes mellitus without complication (HCC)    Hypertension    KNEE PAIN, BILATERAL 02/13/2009   Qualifier: Diagnosis of  By: Gladis FNP, Delorise     Tinnitus      Family History  Problem Relation Age of Onset   Diabetes Mother    Hypertension Father    Breast cancer Neg Hx      Current Outpatient Medications:    acetaminophen  (TYLENOL ) 500 MG tablet, Take 1 tablet (500 mg total) by mouth every  4 (four) hours as needed., Disp: 30 tablet, Rfl: 0   albuterol  (VENTOLIN  HFA) 108 (90 Base) MCG/ACT inhaler, Inhale 1-2 puffs into the lungs every 6 (six) hours as needed for wheezing or shortness of breath., Disp: 6.7 g, Rfl: 0   Alcohol Swabs (ALCOHOL WIPES) 70 % PADS, by Does not apply route. Use with blood sugar check and injection of insulin , Disp: , Rfl:    Cholecalciferol  (VITAMIN D3) 25 MCG (1000 UT) CAPS, Take 1 capsule (1,000 Units  total) by mouth daily., Disp: 60 capsule, Rfl: 1   dorzolamide -timolol  (COSOPT ) 2-0.5 % ophthalmic solution, Instill 1 drop into both eyes twice a day, Disp: 10 mL, Rfl: 3   dorzolamide -timolol  (COSOPT ) 2-0.5 % ophthalmic solution, Instill 1 drop into both eyes twice a day, Disp: 10 mL, Rfl: 3   dorzolamide -timolol  (COSOPT ) 2-0.5 % ophthalmic solution, Place 1 drop into both eyes 2 (two) times daily., Disp: 10 mL, Rfl: 1   furosemide  (LASIX ) 20 MG tablet, Take 1 tablet (20 mg total) by mouth every morning for 7 days., Disp: 7 tablet, Rfl: 0   glucose blood (ACCU-CHEK GUIDE) test strip, TEST 3 TIMES A DAY, Disp: 100 strip, Rfl: 3   MELATONIN PO, Take by mouth., Disp: , Rfl:    metFORMIN  (GLUCOPHAGE ) 500 MG tablet, Take 1 tablet (500 mg total) by mouth daily with breakfast., Disp: 90 tablet, Rfl: 1   Multiple Vitamins-Minerals (CENTRUM SILVER 50+WOMEN) TABS, Take 1 tablet by mouth daily., Disp: , Rfl:    olmesartan  (BENICAR ) 20 MG tablet, Take 1 tablet (20 mg total) by mouth in the morning and at bedtime., Disp: 90 tablet, Rfl: 1   omeprazole  (PRILOSEC) 20 MG capsule, Take 1 capsule (20 mg total) by mouth daily., Disp: 30 capsule, Rfl: 0   potassium chloride  (KLOR-CON ) 10 MEQ tablet, Take 1 tablet (10 mEq total) by mouth daily for 7 days., Disp: 7 tablet, Rfl: 0   Allergies  Allergen Reactions   Aspirin  Other (See Comments)    insomnia   Hydroxyzine  Other (See Comments)    insomnia   Valium  [Diazepam ] Other (See Comments)    Insomnia, 05/10/16 pt states she took as young person- made her depressed     Review of Systems  Constitutional: Negative.   HENT:  Positive for tinnitus.   Respiratory: Negative.    Cardiovascular: Negative.   Neurological: Negative.   Psychiatric/Behavioral: Negative.       Today's Vitals   08/16/24 1030  BP: 110/70  Pulse: 74  Temp: 98.1 F (36.7 C)  SpO2: 98%  Weight: 183 lb 12.8 oz (83.4 kg)  Height: 5' 3 (1.6 m)   Body mass index is 32.56 kg/m.  Wt  Readings from Last 3 Encounters:  08/16/24 183 lb 12.8 oz (83.4 kg)  08/03/24 175 lb (79.4 kg)  05/04/24 179 lb 14.3 oz (81.6 kg)     Objective:  Physical Exam Constitutional:      Appearance: Normal appearance.  HENT:     Right Ear: External ear normal. No laceration, drainage, swelling or tenderness. No middle ear effusion. There is no impacted cerumen. No foreign body.     Left Ear: External ear normal. No laceration, drainage, swelling or tenderness.  No middle ear effusion. There is no impacted cerumen. No foreign body.  Cardiovascular:     Rate and Rhythm: Normal rate and regular rhythm.     Pulses: Normal pulses.     Heart sounds: Normal heart sounds.  Pulmonary:  Effort: Pulmonary effort is normal.     Breath sounds: Normal breath sounds.  Abdominal:     General: Bowel sounds are normal.  Neurological:     Mental Status: She is alert and oriented to person, place, and time.         Assessment And Plan:  Tinnitus of both ears Assessment & Plan: Chronic tinnitus exacerbated by quiet environments, partially relieved by hearing aids. No dizziness or ear infection. Possible psychological component related to husband's death. - Refer to counseling for psychological support. - Advise to avoid caffeine.  Orders: -     Amb ref to [facility]  Peripheral edema Assessment & Plan: Chronic lower extremity edema, not using recommended compression stockings. - Instruct to wear compression stockings during the day and remove at night.   Hypertensive heart disease without heart failure Assessment & Plan: Stable, chronic, continue olmesartan  20 mg BID   Type 2 diabetes mellitus with hyperlipidemia (HCC) Assessment & Plan: Chronic. Continue metformin  500mg  every day    Aortic atherosclerosis Assessment & Plan: On statin   Class 1 obesity due to excess calories with serious comorbidity and body mass index (BMI) of 32.0 to 32.9 in adult Assessment &  Plan: She is encouraged to strive for BMI less than 30 to decrease cardiac risk. Advised to aim for at least 150 minutes of exercise per week.     Assessment & Plan Chronic tinnitus Chronic tinnitus exacerbated by quiet environments, partially relieved by hearing aids. No dizziness or ear infection. Possible psychological component related to husband's death. - Refer to counseling for psychological support. - Advise to avoid caffeine.  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 - Instruct to wear compression stockings during the day and remove at night.     Return if symptoms worsen or fail to improve.  Patient was given opportunity to ask questions. Patient verbalized understanding of the plan and was able to repeat key elements of the plan. All questions were answered to their satisfaction.   I, Bruna Creighton, NP, have reviewed all documentation for this visit. The documentation on 08/27/2024 for the exam, diagnosis, procedures, and orders are all accurate and complete.    IF YOU HAVE BEEN REFERRED TO A SPECIALIST, IT MAY TAKE 1-2 WEEKS TO SCHEDULE/PROCESS THE REFERRAL. IF YOU HAVE NOT HEARD FROM US /SPECIALIST IN TWO WEEKS, PLEASE GIVE US  A CALL AT [phone] X [phone].   THE PATIENT IS ENCOURAGED TO PRACTICE SOCIAL DISTANCING DUE TO THE COVID-19 PANDEMIC.

## 2024-08-27 ENCOUNTER — Other Ambulatory Visit (HOSPITAL_COMMUNITY): Payer: Self-pay

## 2024-08-28 DIAGNOSIS — E66811 Obesity, class 1: Secondary | ICD-10-CM | POA: Insufficient documentation

## 2024-08-28 DIAGNOSIS — R600 Localized edema: Secondary | ICD-10-CM | POA: Insufficient documentation

## 2024-08-28 NOTE — Assessment & Plan Note (Signed)
 She is encouraged to strive for BMI less than 30 to decrease cardiac risk. Advised to aim for at least 150 minutes of exercise per week.

## 2024-08-28 NOTE — Assessment & Plan Note (Signed)
 On statin.

## 2024-08-28 NOTE — Assessment & Plan Note (Signed)
 Stable, chronic, continue olmesartan  20 mg BID

## 2024-08-28 NOTE — Assessment & Plan Note (Signed)
 Chronic tinnitus exacerbated by quiet environments, partially relieved by hearing aids. No dizziness or ear infection. Possible psychological component related to husband's death. - Refer to counseling for psychological support. - Advise to avoid caffeine.

## 2024-08-28 NOTE — Assessment & Plan Note (Signed)
 Chronic. Continue metformin  500mg  every day

## 2024-08-28 NOTE — Assessment & Plan Note (Signed)
 Chronic lower extremity edema, not using recommended compression stockings. - Instruct to wear compression stockings during the day and remove at night.

## 2024-09-10 ENCOUNTER — Ambulatory Visit: Attending: Cardiology | Admitting: Cardiology

## 2024-09-23 ENCOUNTER — Ambulatory Visit (HOSPITAL_COMMUNITY): Admission: EM | Admit: 2024-09-23 | Discharge: 2024-09-23 | Disposition: A | Discharge status: Home / Self Care

## 2024-09-23 ENCOUNTER — Encounter (HOSPITAL_COMMUNITY): Payer: Self-pay | Admitting: *Deleted

## 2024-09-23 ENCOUNTER — Other Ambulatory Visit (HOSPITAL_COMMUNITY): Payer: Self-pay

## 2024-09-23 ENCOUNTER — Other Ambulatory Visit: Payer: Self-pay

## 2024-09-23 DIAGNOSIS — G4700 Insomnia, unspecified: Secondary | ICD-10-CM

## 2024-09-23 MED ORDER — DOXEPIN HCL 3 MG PO TABS
1.0000 | ORAL_TABLET | Freq: Every evening | ORAL | 0 refills | Status: AC | PRN
  Filled 2024-09-23: qty 30, 30d supply, fill #0

## 2024-09-23 NOTE — Discharge Instructions (Signed)
 Prescribed low-dose of doxepin for insomnia.  Please try this for sleep, if it is helpful please get refills from your primary care provider.  Do not take this with melatonin.

## 2024-09-23 NOTE — ED Provider Notes (Signed)
 [facility]    CSN: [id number] Arrival date & time: 09/23/24  0935      History   Chief Complaint Chief Complaint  Patient presents with   Knee Pain   Tinnitus    HPI Allison Franco is a 83 y.o. female.   Patient presents today due to chronic tinnitus and bilateral knee pain that is preventing her from sleeping.  Patient states that she is seeking primary care and wears hearing aids but states tinnitus gets worse when the seasons changes and is preventing her from sleeping.  Patient presents here hoping to get something for sleep.  Patient states that she has attempted to use melatonin 10 mg without relief.  The history is provided by the patient.  Knee Pain   Past Medical History:  Diagnosis Date   Anxiety    Constipation    Depression    following husband's death   Diabetes mellitus without complication (HCC)    Hypertension    KNEE PAIN, BILATERAL 02/13/2009   Qualifier: Diagnosis of  By: Gladis FNP, Delorise     Tinnitus     Patient Active Problem List   Diagnosis Date Noted   Class 1 obesity due to excess calories with serious comorbidity and body mass index (BMI) of 32.0 to 32.9 in adult 08/28/2024   Peripheral edema 08/28/2024   Encounter for annual health examination 03/11/2024   Abnormal EKG 03/11/2024   Type 2 diabetes mellitus with hyperlipidemia (HCC) 10/24/2023   Need for influenza vaccination 10/24/2023   COVID-19 vaccine administered 10/24/2023   Seasonal allergies 07/29/2023   Dysfunction of Eustachian tube, bilateral 07/29/2023   Obesity (BMI 30.0-34.9) 06/20/2023   Primary open angle glaucoma of both eyes, severe stage 06/20/2023   Facial rash 06/20/2023   Aortic atherosclerosis 02/23/2023   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/23/2023   Constipation 02/14/2023   Unilateral primary osteoarthritis, left knee 05/18/2022   Inguinal hernia with bowel obstruction 08/07/2019   Incarcerated left inguinal hernia 08/07/2019    Sensorineural hearing loss (SNHL) of right ear with restricted hearing of left ear 05/21/2019   Tinnitus of both ears 05/21/2019   Anemia 08/31/2018   Type 2 diabetes mellitus without complication, without long-term current use of insulin  (HCC) 07/02/2018   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      Home Medications    Prior to Admission medications   Medication Sig Start Date End Date Taking? Authorizing Provider  Doxepin HCl 3 MG TABS Take 1 tablet (3 mg total) by mouth at bedtime as needed (for insomnia). 09/23/24  Yes Andra Krabbe C, PA-C  acetaminophen  (TYLENOL ) 500 MG tablet Take 1 tablet (500 mg total) by mouth every 4 (four) hours as needed. 02/21/23   Rising, Asberry, PA-C  albuterol  (VENTOLIN  HFA) 108 (90 Base) MCG/ACT inhaler Inhale 1-2 puffs into the lungs every 6 (six) hours as needed for wheezing or shortness of breath. 10/17/23   Myrna Salines  M, NP  Alcohol Swabs (ALCOHOL WIPES) 70 % PADS by Does not apply route. Use with blood sugar check and injection of insulin     [provider]  Cholecalciferol  (VITAMIN D3) 25 MCG (1000 UT) CAPS Take 1 capsule (1,000 Units total) by mouth daily. 06/02/21   Georgina Speaks, FNP  dorzolamide -timolol  (COSOPT ) 2-0.5 % ophthalmic solution Instill 1 drop into both eyes twice a day 01/25/23     dorzolamide -timolol  (COSOPT ) 2-0.5 % ophthalmic solution Instill 1 drop into both eyes twice a day 05/05/23     dorzolamide -timolol  (COSOPT ) 2-0.5 %  ophthalmic solution Place 1 drop into both eyes 2 (two) times daily. 07/01/24     furosemide  (LASIX ) 20 MG tablet Take 1 tablet (20 mg total) by mouth every morning for 7 days. 06/17/24 08/16/24  Iola Lukes, FNP  glucose blood (ACCU-CHEK GUIDE) test strip TEST 3 TIMES A DAY 08/29/22   Georgina Speaks, FNP  metFORMIN  (GLUCOPHAGE ) 500 MG tablet Take 1 tablet (500 mg total) by mouth daily with breakfast. 08/01/24   Georgina Speaks, FNP  Multiple Vitamins-Minerals (CENTRUM SILVER 50+WOMEN) TABS Take 1 tablet by mouth daily.    [provider]  olmesartan  (BENICAR ) 20 MG tablet Take 1 tablet (20 mg total) by mouth in the morning and at bedtime. 08/16/24   Georgina Speaks, FNP  omeprazole  (PRILOSEC) 20 MG capsule Take 1 capsule (20 mg total) by mouth daily. 06/17/24   Iola Lukes, FNP  potassium chloride  (KLOR-CON ) 10 MEQ tablet Take 1 tablet (10 mEq total) by mouth daily for 7 days. 06/17/24 08/16/24  Iola Lukes, FNP    Family History Family History  Problem Relation Age of Onset   Diabetes Mother    Hypertension Father    Breast cancer Neg Hx     Social History Social History   Tobacco Use   Smoking status: Never   Smokeless tobacco: Never  Vaping Use   Vaping status: Never Used  Substance Use Topics   Alcohol use: No   Drug use: No     Allergies   Aspirin , Hydroxyzine , and Valium  [diazepam ]   Review of Systems Review of Systems   Physical Exam Triage Vital Signs ED Triage Vitals  Encounter Vitals Group     BP 09/23/24 1101 118/65     Girls Systolic BP Percentile --      Girls Diastolic BP Percentile --      Boys Systolic BP Percentile --      Boys Diastolic BP Percentile --      Pulse Rate 09/23/24 1101 63     Resp 09/23/24 1101 18     Temp 09/23/24 1101 97.7 F (36.5 C)     Temp src --      SpO2 09/23/24 1101 94 %     Weight --      Height --      Head Circumference --      Peak Flow --      Pain Score 09/23/24 1059 8     Pain Loc --      Pain  Education --      Exclude from Growth Chart --    No data found.  Updated Vital Signs BP 118/65   Pulse 63   Temp 97.7 F (36.5 C)   Resp 18   SpO2 94%   Visual Acuity Right Eye Distance:   Left Eye Distance:   Bilateral Distance:    Right Eye Near:   Left Eye Near:  Bilateral Near:     Physical Exam Vitals and nursing note reviewed.  Constitutional:      General: She is not in acute distress.    Appearance: Normal appearance. She is not ill-appearing, toxic-appearing or diaphoretic.  HENT:     Right Ear: Tympanic membrane, ear canal and external ear normal.     Left Ear: Tympanic membrane, ear canal and external ear normal.  Eyes:     General: No scleral icterus. Cardiovascular:     Rate and Rhythm: Normal rate and regular rhythm.     Heart sounds: Normal heart sounds.  Pulmonary:     Effort: Pulmonary effort is normal. No respiratory distress.     Breath sounds: Normal breath sounds. No wheezing or rhonchi.  Skin:    General: Skin is warm.  Neurological:     Mental Status: She is alert and oriented to person, place, and time.  Psychiatric:        Mood and Affect: Mood normal.        Behavior: Behavior normal.      UC Treatments / Results  Labs (all labs ordered are listed, but only abnormal results are displayed) Labs Reviewed - No data to display  EKG   Radiology No results found.  Procedures Procedures (including critical care time)  Medications Ordered in UC Medications - No data to display  Initial Impression / Assessment and Plan / UC Course  I have reviewed the triage vital signs and the nursing notes.  Pertinent labs & imaging results that were available during my care of the patient were reviewed by me and considered in my medical decision making (see chart for details).     Hesitant to prescribe medication for insomnia.  Prescribed low-dose doxepin as it has no side effects with her current medications but her insurance does not  cover, found this out via message from pharmacist.  Advised pharmacist until patient to follow-up PCP in order to discover a medicine that is best for her insomnia. Final Clinical Impressions(s) / UC Diagnoses   Final diagnoses:  Insomnia, unspecified type     Discharge Instructions      Prescribed low-dose of doxepin for insomnia.  Please try this for sleep, if it is helpful please get refills from your primary care provider.  Do not take this with melatonin.   ED Prescriptions     Medication Sig Dispense Auth. Provider   Doxepin HCl 3 MG TABS Take 1 tablet (3 mg total) by mouth at bedtime as needed (for insomnia). 30 tablet Andra Corean BROCKS, PA-C      PDMP not reviewed this encounter.   Andra Corean BROCKS, PA-C 09/23/24 1223

## 2024-09-23 NOTE — ED Triage Notes (Signed)
 PT reports she is here to be checked out. Pt has bil knee pain and bil ear ringing.Ear ringing has been going on for a long time .

## 2024-10-14 ENCOUNTER — Ambulatory Visit (HOSPITAL_COMMUNITY)
Admission: EM | Admit: 2024-10-14 | Discharge: 2024-10-14 | Disposition: A | Discharge status: Home / Self Care | Attending: Emergency Medicine | Admitting: Emergency Medicine

## 2024-10-14 ENCOUNTER — Other Ambulatory Visit (HOSPITAL_COMMUNITY): Payer: Self-pay

## 2024-10-14 ENCOUNTER — Encounter (HOSPITAL_COMMUNITY): Payer: Self-pay | Admitting: Emergency Medicine

## 2024-10-14 DIAGNOSIS — M25561 Pain in right knee: Secondary | ICD-10-CM | POA: Diagnosis not present

## 2024-10-14 DIAGNOSIS — M25562 Pain in left knee: Secondary | ICD-10-CM | POA: Diagnosis not present

## 2024-10-14 DIAGNOSIS — G8929 Other chronic pain: Secondary | ICD-10-CM | POA: Diagnosis not present

## 2024-10-14 DIAGNOSIS — H9313 Tinnitus, bilateral: Secondary | ICD-10-CM

## 2024-10-14 MED ORDER — DICLOFENAC SODIUM 50 MG PO TBEC
50.0000 mg | DELAYED_RELEASE_TABLET | Freq: Two times a day (BID) | ORAL | 0 refills | Status: AC
  Filled 2024-10-14: qty 30, 15d supply, fill #0

## 2024-10-14 NOTE — Discharge Instructions (Signed)
 Unfortunately it appears you are having a flareup of your chronic knee pain.  He can take the Voltaren  tablet up to 2 times daily as needed for pain.  Take this sparingly as due to your age you are more prone to stomach ulcers and gastric bleeding while taking an NSAID.  Follow-up with sports medicine where they can consider steroid injections of the knees to see if this makes her pain more manageable.  Follow-up with your primary care provider regarding the ringing in your ears.  Return to clinic for any new or urgent symptoms.

## 2024-10-14 NOTE — ED Triage Notes (Signed)
 Pt c/o bil knee pain and generalized body aches for approx 1 month.  St's she can't sleep at night due to the pain

## 2024-10-14 NOTE — ED Provider Notes (Signed)
 [facility]    CSN: [id number] Arrival date & time: 10/14/24  1053      History   Chief Complaint Chief Complaint  Patient presents with   Knee Pain    HPI Allison Franco is a 83 y.o. female.   Patient presents to clinic over concern of bilateral knee pain has been keeping her up at night, seems to be worse over the past month.  Has also been having some tinnitus.  Does have a history of osteoarthritis and has followed with orthopedics in the past, most recent April of this year.  They had evaluated her and advised her to return if pain progressed for steroid injections.  Feels like the diclofenac  helped.  Has tried Voltaren  gel in the past and has not helped.  Has not any recent trauma, falls or injuries.  No fever or recent illness.  No drainage from the ears.  Wears bilateral hearing aids.  The history is provided by the patient and medical records.  Knee Pain   Past Medical History:  Diagnosis Date   Anxiety    Constipation    Depression    following husband's death   Diabetes mellitus without complication (HCC)    Hypertension    KNEE PAIN, BILATERAL 02/13/2009   Qualifier: Diagnosis of  By: Gladis FNP, Delorise     Tinnitus     Patient Active Problem List   Diagnosis Date Noted   Class 1 obesity due to excess calories with serious comorbidity and body mass index (BMI) of 32.0 to 32.9 in adult 08/28/2024   Peripheral edema 08/28/2024   Encounter for annual health examination 03/11/2024   Abnormal EKG 03/11/2024   Type 2 diabetes mellitus with hyperlipidemia (HCC) 10/24/2023   Need for influenza vaccination 10/24/2023   COVID-19 vaccine administered 10/24/2023   Seasonal allergies 07/29/2023   Dysfunction of Eustachian tube, bilateral 07/29/2023   Obesity (BMI 30.0-34.9) 06/20/2023   Primary open angle glaucoma of both eyes, severe stage 06/20/2023   Facial rash 06/20/2023   Aortic atherosclerosis 02/23/2023   Hypertensive heart disease without  heart failure 02/23/2023   Constipation 02/14/2023   Unilateral primary osteoarthritis, left knee 05/18/2022   Inguinal hernia with bowel obstruction 08/07/2019   Incarcerated left inguinal hernia 08/07/2019   Sensorineural hearing loss (SNHL) of right ear with restricted hearing of left ear 05/21/2019   Tinnitus of both ears 05/21/2019   Anemia 08/31/2018   Type 2 diabetes mellitus without complication, without long-term current use of insulin  (HCC) 07/02/2018   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      Home Medications    Prior to Admission medications   Medication Sig Start Date End Date Taking? Authorizing Provider  diclofenac  (VOLTAREN ) 50 MG EC tablet Take 1 tablet (50 mg total) by mouth 2 (two) times daily. 10/14/24  Yes Newell Frater  N, FNP  acetaminophen  (TYLENOL ) 500 MG tablet Take 1 tablet (500 mg total) by mouth every  4 (four) hours as needed. 02/21/23   Rising, Asberry, PA-C  albuterol  (VENTOLIN  HFA) 108 (90 Base) MCG/ACT inhaler Inhale 1-2 puffs into the lungs every 6 (six) hours as needed for wheezing or shortness of breath. 10/17/23   Myrna Camelia HERO, NP  Alcohol Swabs (ALCOHOL WIPES) 70 % PADS by Does not apply route. Use with blood sugar check and injection of insulin     [provider]  Cholecalciferol  (VITAMIN D3) 25 MCG (1000 UT) CAPS Take 1 capsule (1,000 Units total) by mouth daily. 06/02/21   Georgina Speaks, FNP  dorzolamide -timolol  (COSOPT ) 2-0.5 % ophthalmic  solution Instill 1 drop into both eyes twice a day 01/25/23     dorzolamide -timolol  (COSOPT ) 2-0.5 % ophthalmic solution Instill 1 drop into both eyes twice a day 05/05/23     dorzolamide -timolol  (COSOPT ) 2-0.5 % ophthalmic solution Place 1 drop into both eyes 2 (two) times daily. 07/01/24     Doxepin HCl 3 MG TABS Take 1 tablet (3 mg total) by mouth at bedtime as needed (for insomnia). 09/23/24   Andra Corean BROCKS, PA-C  furosemide  (LASIX ) 20 MG tablet Take 1 tablet (20 mg total) by mouth every morning for 7 days. 06/17/24 08/16/24  Iola Lukes, FNP  glucose blood (ACCU-CHEK GUIDE) test strip TEST 3 TIMES A DAY 08/29/22   Georgina Speaks, FNP  metFORMIN  (GLUCOPHAGE ) 500 MG tablet Take 1 tablet (500 mg total) by mouth daily with breakfast. 08/01/24   Georgina Speaks, FNP  Multiple Vitamins-Minerals (CENTRUM SILVER 50+WOMEN) TABS Take 1 tablet by mouth daily.    [provider]  olmesartan  (BENICAR ) 20 MG tablet Take 1 tablet (20 mg total) by mouth in the morning and at bedtime. 08/16/24   Georgina Speaks, FNP  omeprazole  (PRILOSEC) 20 MG capsule Take 1 capsule (20 mg total) by mouth daily. 06/17/24   Murrill, Samantha, FNP  potassium chloride  (KLOR-CON ) 10 MEQ tablet Take 1 tablet (10 mEq total) by mouth daily for 7 days. 06/17/24 08/16/24  Iola Lukes, FNP    Family History Family History  Problem Relation Age of Onset   Diabetes Mother    Hypertension Father    Breast cancer Neg Hx     Social History Social History   Tobacco Use   Smoking status: Never   Smokeless tobacco: Never  Vaping Use   Vaping status: Never Used  Substance Use Topics   Alcohol use: No   Drug use: No     Allergies   Aspirin , Hydroxyzine , and Valium  Ervin.faster ]   Review of Systems Review of Systems  Per HPI  Physical Exam Triage Vital Signs ED Triage Vitals  Encounter Vitals Group     BP 10/14/24 1237 (!) 146/81     Girls Systolic BP Percentile --      Girls Diastolic BP Percentile --       Boys Systolic BP Percentile --      Boys Diastolic BP Percentile --      Pulse Rate 10/14/24 1237 72     Resp 10/14/24 1237 18     Temp 10/14/24 1237 97.9 F (36.6 C)     Temp Source 10/14/24 1237 Oral     SpO2 10/14/24 1237 96 %     Weight --      Height --      Head Circumference --      Peak Flow --      Pain Score 10/14/24 1239 8     Pain Loc --  Pain Education --      Exclude from Growth Chart --    No data found.  Updated Vital Signs BP (!) 146/81 (BP Location: Right Arm)   Pulse 72   Temp 97.9 F (36.6 C) (Oral)   Resp 18   SpO2 96%   Visual Acuity Right Eye Distance:   Left Eye Distance:   Bilateral Distance:    Right Eye Near:   Left Eye Near:    Bilateral Near:     Physical Exam Vitals and nursing note reviewed.  Constitutional:      Appearance: Normal appearance.  HENT:     Head: Normocephalic and atraumatic.     Right Ear: Tympanic membrane, ear canal and external ear normal.     Left Ear: Tympanic membrane, ear canal and external ear normal.     Nose: Nose normal.     Mouth/Throat:     Mouth: Mucous membranes are moist.  Eyes:     Conjunctiva/sclera: Conjunctivae normal.  Cardiovascular:     Rate and Rhythm: Normal rate.  Pulmonary:     Effort: Pulmonary effort is normal. No respiratory distress.  Musculoskeletal:        General: No swelling, tenderness or signs of injury. Normal range of motion.  Skin:    General: Skin is warm and dry.  Neurological:     General: No focal deficit present.     Mental Status: She is alert and oriented to person, place, and time.  Psychiatric:        Mood and Affect: Mood normal.        Behavior: Behavior normal.      UC Treatments / Results  Labs (all labs ordered are listed, but only abnormal results are displayed) Labs Reviewed - No data to display  EKG   Radiology No results found.  Procedures Procedures (including critical care time)  Medications Ordered in UC Medications - No data  to display  Initial Impression / Assessment and Plan / UC Course  I have reviewed the triage vital signs and the nursing notes.  Pertinent labs & imaging results that were available during my care of the patient were reviewed by me and considered in my medical decision making (see chart for details).  Vitals and triage reviewed, patient is hemodynamically stable.  Bilateral knees with full range of motion, atraumatic and without acute deformity, swelling or tenderness.  Suspect osteoarthritis flare.  Diclofenac  refill sent and encouraged orthopedic follow-up for potential steroid injections.  Bilateral tympanic membrane's are pearly gray, no evidence of otitis media.  PCP follow-up for tinnitus.  Chronic.  Plan of care, follow-up care return precautions given, no questions at this time.     Final Clinical Impressions(s) / UC Diagnoses   Final diagnoses:  Chronic pain of both knees  Tinnitus of both ears     Discharge Instructions      Unfortunately it appears you are having a flareup of your chronic knee pain.  He can take the Voltaren  tablet up to 2 times daily as needed for pain.  Take this sparingly as due to your age you are more prone to stomach ulcers and gastric bleeding while taking an NSAID.  Follow-up with sports medicine where they can consider steroid injections of the knees to see if this makes her pain more manageable.  Follow-up with your primary care provider regarding the ringing in your ears.  Return to clinic for any new or urgent symptoms.     ED  Prescriptions     Medication Sig Dispense Auth. Provider   diclofenac  (VOLTAREN ) 50 MG EC tablet Take 1 tablet (50 mg total) by mouth 2 (two) times daily. 30 tablet Dreama, Karolina Zamor  N, FNP      PDMP not reviewed this encounter.   Dreama, Aldea Avis  N, FNP 10/14/24 1327

## 2024-10-20 ENCOUNTER — Encounter (HOSPITAL_COMMUNITY): Payer: Self-pay

## 2024-10-20 ENCOUNTER — Ambulatory Visit (HOSPITAL_COMMUNITY)
Admission: EM | Admit: 2024-10-20 | Discharge: 2024-10-20 | Disposition: A | Discharge status: Home / Self Care | Attending: Nurse Practitioner | Admitting: Nurse Practitioner

## 2024-10-20 DIAGNOSIS — R519 Headache, unspecified: Secondary | ICD-10-CM | POA: Diagnosis not present

## 2024-10-20 DIAGNOSIS — G5793 Unspecified mononeuropathy of bilateral lower limbs: Secondary | ICD-10-CM | POA: Diagnosis not present

## 2024-10-20 DIAGNOSIS — H9313 Tinnitus, bilateral: Secondary | ICD-10-CM | POA: Diagnosis not present

## 2024-10-20 MED ORDER — KETOROLAC TROMETHAMINE 30 MG/ML IJ SOLN
INTRAMUSCULAR | Status: AC
  Filled 2024-10-20: qty 1

## 2024-10-20 MED ORDER — KETOROLAC TROMETHAMINE 30 MG/ML IJ SOLN
15.0000 mg | Freq: Once | INTRAMUSCULAR | Status: AC
  Administered 2024-10-20: 15 mg via INTRAMUSCULAR

## 2024-10-20 NOTE — ED Triage Notes (Signed)
 Patient here today with c/o SOB, headache, and wheeze since last night. Patient has taken Tylenol  with little relief. No known sick contacts.   Patient is also c/o bilat foot pain since last night. Feels like pins and needles. No known injury.

## 2024-10-20 NOTE — ED Provider Notes (Signed)
 [facility]    CSN: [id number] Arrival date & time: 10/20/24  1519      History   Chief Complaint Chief Complaint  Patient presents with   Shortness of Breath   Foot Pain    HPI Allison Franco is a 83 y.o. female.   Discussed the use of AI scribe software for clinical note transcription with the patient, who gave verbal consent to proceed.   The patient, with a history of diabetes, chronic leg edema, chronic tinnitus, and hypertension, presents with a headache that began last night. The patient was previously seen on November 17th for ongoing leg and knee pain lasting about one month, for which diclofenac  pills provided relief, although diclofenac  gel was ineffective. The patient denies any falls or injuries related to the headache. Despite taking Tylenol , the headache persists without relief. The patient reports dizziness but denies associated symptoms such as nausea, vision changes, light sensitivity, weakness, vomiting, neck pain, chest pain, or breathing difficulties.  In addition to the headache, the patient reports tingling in the feet, described as a pins and needles sensation, which has been ongoing for the past three days. The patient also notes ringing in both ears, with the right ear being worse than the left. Previous leg swelling has improved. The patient mentions that headaches are not a common symptom for them.  The following sections of the patient's history were reviewed and updated as appropriate: allergies, current medications, past family history, past medical history, past social history, past surgical history, and problem list.       Past Medical History:  Diagnosis Date   Anxiety    Constipation    Depression    following husband's death   Diabetes mellitus without complication (HCC)    Hypertension    KNEE PAIN, BILATERAL 02/13/2009   Qualifier: Diagnosis of  By: Gladis FNP, Delorise     Tinnitus     Patient Active Problem List    Diagnosis Date Noted   Class 1 obesity due to excess calories with serious comorbidity and body mass index (BMI) of 32.0 to 32.9 in adult 08/28/2024   Peripheral edema 08/28/2024   Encounter for annual health examination 03/11/2024   Abnormal EKG 03/11/2024   Type 2 diabetes mellitus with hyperlipidemia (HCC) 10/24/2023   Need for influenza vaccination 10/24/2023   COVID-19 vaccine administered 10/24/2023   Seasonal allergies 07/29/2023   Dysfunction of Eustachian tube, bilateral 07/29/2023   Obesity (BMI 30.0-34.9) 06/20/2023   Primary open angle glaucoma of both eyes, severe stage 06/20/2023   Facial rash 06/20/2023   Aortic atherosclerosis 02/23/2023   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/23/2023   Constipation 02/14/2023   Unilateral primary osteoarthritis, left knee 05/18/2022   Inguinal hernia with bowel obstruction 08/07/2019   Incarcerated left inguinal hernia 08/07/2019   Sensorineural hearing loss (SNHL) of right ear with restricted hearing of left ear 05/21/2019   Tinnitus of both ears 05/21/2019   Anemia 08/31/2018   Type 2 diabetes mellitus without complication, without long-term current use of insulin  (HCC) 07/02/2018   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      Home Medications    Prior to Admission medications   Medication Sig Start Date End Date Taking? Authorizing Provider  acetaminophen  (TYLENOL ) 500 MG tablet Take 1 tablet (500 mg total) by mouth every 4  (four) hours as needed. 02/21/23   Rising, Asberry, PA-C  albuterol  (VENTOLIN  HFA) 108 (90 Base) MCG/ACT inhaler Inhale 1-2 puffs into the lungs every 6 (six) hours as needed for wheezing or shortness of breath. 10/17/23   Myrna Camelia HERO, NP  Alcohol Swabs (ALCOHOL WIPES) 70 % PADS by Does not apply route. Use with blood sugar check and injection of insulin     [provider]  Cholecalciferol  (VITAMIN D3) 25 MCG (1000 UT) CAPS Take 1 capsule (1,000 Units total) by mouth daily. 06/02/21   Georgina Speaks, FNP  diclofenac  (VOLTAREN ) 50 MG EC tablet Take 1 tablet (50 mg total) by mouth 2 (two) times daily. 10/14/24   Dreama, Georgia  N, FNP  dorzolamide -timolol  (COSOPT ) 2-0.5 % ophthalmic solution Instill 1 drop into both eyes twice a day 01/25/23     dorzolamide -timolol  (COSOPT ) 2-0.5 % ophthalmic solution Instill 1 drop into both eyes twice a day 05/05/23     dorzolamide -timolol  (COSOPT ) 2-0.5 % ophthalmic solution Place 1 drop into both eyes 2 (two) times daily. 07/01/24     Doxepin  HCl 3 MG TABS Take 1 tablet (3 mg total) by mouth at bedtime as needed (for insomnia). 09/23/24   Andra Corean BROCKS, PA-C  furosemide  (LASIX ) 20 MG tablet Take 1 tablet (20 mg total) by mouth every morning for 7 days. 06/17/24 08/16/24  Iola Lukes, FNP  glucose blood (ACCU-CHEK GUIDE) test strip TEST 3 TIMES A DAY 08/29/22   Georgina Speaks, FNP  metFORMIN  (GLUCOPHAGE ) 500 MG tablet Take 1 tablet (500 mg total) by mouth daily with breakfast. 08/01/24   Georgina Speaks, FNP  Multiple Vitamins-Minerals (CENTRUM SILVER 50+WOMEN) TABS Take 1 tablet by mouth daily.    [provider]  olmesartan  (BENICAR ) 20 MG tablet Take 1 tablet (20 mg total) by mouth in the morning and at bedtime. 08/16/24   Georgina Speaks, FNP  omeprazole  (PRILOSEC) 20 MG capsule Take 1 capsule (20 mg total) by mouth daily. 06/17/24   Iola Lukes, FNP  potassium chloride  (KLOR-CON ) 10 MEQ tablet Take 1 tablet (10 mEq total) by mouth daily  for 7 days. 06/17/24 08/16/24  Iola Lukes, FNP    Family History Family History  Problem Relation Age of Onset   Diabetes Mother    Hypertension Father    Breast cancer Neg Hx     Social History Social History   Tobacco Use   Smoking status: Never   Smokeless tobacco: Never  Vaping Use   Vaping status: Never Used  Substance Use Topics   Alcohol use: No   Drug use: No     Allergies   Aspirin , Hydroxyzine , and Valium  [diazepam ]   Review of Systems Review of Systems  Eyes:  Negative for photophobia and visual disturbance.  Respiratory:  Positive for shortness of breath (when walking but not a rest).   Cardiovascular:  Negative for chest pain, palpitations and leg swelling.  Gastrointestinal:  Negative for nausea and vomiting.  Musculoskeletal:  Positive for myalgias. Negative for neck pain.  Neurological:  Positive for dizziness and headaches. Negative for weakness and numbness.  Tingling in the feet   All other systems reviewed and are negative.    Physical Exam Triage Vital Signs ED Triage Vitals  Encounter Vitals Group     BP 10/20/24 1533 131/68     Girls Systolic BP Percentile --      Girls Diastolic BP Percentile --      Boys Systolic BP Percentile --      Boys Diastolic BP Percentile --      Pulse Rate 10/20/24 1533 74     Resp 10/20/24 1533 16     Temp 10/20/24 1533 98 F (36.7 C)     Temp Source 10/20/24 1533 Oral     SpO2 10/20/24 1533 100 %     Weight --      Height --      Head Circumference --      Peak Flow --      Pain Score 10/20/24 1528 8     Pain Loc --      Pain Education --      Exclude from Growth Chart --    No data found.  Updated Vital Signs BP 131/68 (BP Location: Left Arm)   Pulse 74   Temp 98 F (36.7 C) (Oral)   Resp 16   SpO2 100%   Visual Acuity Right Eye Distance:   Left Eye Distance:   Bilateral Distance:    Right Eye Near:   Left Eye Near:    Bilateral Near:     Physical Exam Vitals reviewed.   Constitutional:      General: She is awake. She is not in acute distress.    Appearance: Normal appearance. She is well-developed. She is not ill-appearing, toxic-appearing or diaphoretic.  HENT:     Head: Normocephalic.     Right Ear: Hearing normal. No swelling or tenderness. No middle ear effusion. Tympanic membrane is not erythematous, retracted or bulging.     Left Ear: Hearing normal. No swelling or tenderness.  No middle ear effusion. Tympanic membrane is not erythematous, retracted or bulging.     Nose: Nose normal.     Mouth/Throat:     Mouth: Mucous membranes are moist.     Pharynx: Oropharynx is clear. Uvula midline.  Eyes:     General: Vision grossly intact.     Extraocular Movements: Extraocular movements intact.     Right eye: Normal extraocular motion and no nystagmus.     Left eye: Normal extraocular motion and no nystagmus.     Conjunctiva/sclera: Conjunctivae normal.     Pupils: Pupils are equal, round, and reactive to light.  Cardiovascular:     Rate and Rhythm: Normal rate.     Heart sounds: Normal heart sounds.  Pulmonary:     Effort: Pulmonary effort is normal.     Breath sounds: Normal breath sounds.  Abdominal:     Palpations: Abdomen is soft.  Musculoskeletal:        General: Normal range of motion.     Cervical back: Full passive range of motion without pain, normal range of motion and neck supple. No rigidity or tenderness.  Lymphadenopathy:     Cervical: No cervical adenopathy.  Skin:    General: Skin is warm and dry.  Neurological:     General: No focal deficit present.     Mental Status: She is alert and oriented to person, place, and time.     Cranial Nerves: No cranial nerve deficit.     Sensory: Sensation is  intact. No sensory deficit.     Motor: Motor function is intact. No weakness.     Coordination: Coordination is intact.     Gait: Gait is intact.  Psychiatric:        Behavior: Behavior is cooperative.      UC Treatments / Results   Labs (all labs ordered are listed, but only abnormal results are displayed) Labs Reviewed - No data to display  EKG   Radiology No results found.  Procedures Procedures (including critical care time)  Medications Ordered in UC Medications  ketorolac  (TORADOL ) 30 MG/ML injection 15 mg (15 mg Intramuscular Given 10/20/24 1844)    Initial Impression / Assessment and Plan / UC Course  I have reviewed the triage vital signs and the nursing notes.  Pertinent labs & imaging results that were available during my care of the patient were reviewed by me and considered in my medical decision making (see chart for details).     The patient presents with a new-onset headache that began last night, describing it as unusual since they do not typically experience headaches. Associated symptoms include dizziness and bilateral tinnitus, which is worse on the right side. The patient has a history of chronic tinnitus, which started several years ago following the death of her daughter. The tinnitus has not improved with hearing aids or ENT consultation, and a psychological component is considered to be contributing. Acetaminophen  provided no relief. The patient denies associated nausea, visual changes, photophobia, neck pain, chest pain, or respiratory symptoms. Vital signs are stable with a blood pressure of 131/68 mmHg and no fever. Physical examination shows no neurological deficits, and recent blood work from September showed normal kidney function.  The patient was administered a Toradol  injection for headache relief, with instructions to increase fluid intake and rest. She was advised to avoid caffeine and to follow up with her primary care provider for further evaluation and management.  The patient also reports tingling in her feet, consistent with diabetic peripheral neuropathy, a known complication of her diabetes. Sensation to touch was intact on examination. She was advised to follow up with  her primary care provider for ongoing management of neuropathy.  Today's evaluation has revealed no signs of a dangerous process. Discussed diagnosis with patient and/or guardian. Patient and/or guardian aware of their diagnosis, possible red flag symptoms to watch out for and need for close follow up. Patient and/or guardian understands verbal and written discharge instructions. Patient and/or guardian comfortable with plan and disposition.  Patient and/or guardian has a clear mental status at this time, good insight into illness (after discussion and teaching) and has clear judgment to make decisions regarding their care  Documentation was completed with the aid of voice recognition software. Transcription may contain typographical errors.  Final Clinical Impressions(s) / UC Diagnoses   Final diagnoses:  Bad headache  Tinnitus of both ears  Neuropathy of both feet     Discharge Instructions      You were seen today for a new headache that started last night. You also have had some dizziness and ringing in your ears, which has been ongoing for several years. The ringing in your ears could be related to stress or other emotional factors. The headache didn't improve with Tylenol  at home, so we gave you a Toradol  injection to help relieve the pain. To manage your headache, it's important to rest, drink plenty of fluids, and avoid caffeine. If the headache doesn't improve or gets worse, please follow up  with your primary care provider.  You also mentioned a tingling sensation in your feet, which sounds like diabetic neuropathy, a common issue for people with diabetes. We checked your feet, and sensation to touch is normal, but it's important to follow up with your primary care provider to manage this condition and prevent further issues.  If you experience any new symptoms, such as vision changes, severe headaches, nausea, chest pain, or trouble breathing, please go to the emergency room  immediately. If your symptoms don't improve or worsen, contact your primary care provider for further evaluation.     ED Prescriptions   None    PDMP not reviewed this encounter.   Iola Lukes, [place] 10/20/24 [phone]

## 2024-10-20 NOTE — Discharge Instructions (Addendum)
 You were seen today for a new headache that started last night. You also have had some dizziness and ringing in your ears, which has been ongoing for several years. The ringing in your ears could be related to stress or other emotional factors. The headache didn't improve with Tylenol  at home, so we gave you a Toradol  injection to help relieve the pain. To manage your headache, it's important to rest, drink plenty of fluids, and avoid caffeine. If the headache doesn't improve or gets worse, please follow up with your primary care provider.  You also mentioned a tingling sensation in your feet, which sounds like diabetic neuropathy, a common issue for people with diabetes. We checked your feet, and sensation to touch is normal, but it's important to follow up with your primary care provider to manage this condition and prevent further issues.  If you experience any new symptoms, such as vision changes, severe headaches, nausea, chest pain, or trouble breathing, please go to the emergency room immediately. If your symptoms don't improve or worsen, contact your primary care provider for further evaluation.

## 2024-11-05 ENCOUNTER — Other Ambulatory Visit (HOSPITAL_COMMUNITY): Payer: Self-pay

## 2024-11-05 ENCOUNTER — Ambulatory Visit (HOSPITAL_COMMUNITY)
Admission: EM | Admit: 2024-11-05 | Discharge: 2024-11-05 | Disposition: A | Discharge status: Home / Self Care | Attending: Emergency Medicine | Admitting: Emergency Medicine

## 2024-11-05 ENCOUNTER — Encounter (HOSPITAL_COMMUNITY): Payer: Self-pay | Admitting: *Deleted

## 2024-11-05 DIAGNOSIS — R1013 Epigastric pain: Secondary | ICD-10-CM

## 2024-11-05 DIAGNOSIS — M25561 Pain in right knee: Secondary | ICD-10-CM

## 2024-11-05 DIAGNOSIS — M25562 Pain in left knee: Secondary | ICD-10-CM

## 2024-11-05 DIAGNOSIS — G8929 Other chronic pain: Secondary | ICD-10-CM

## 2024-11-05 MED ORDER — OMEPRAZOLE 20 MG PO CPDR
20.0000 mg | DELAYED_RELEASE_CAPSULE | Freq: Two times a day (BID) | ORAL | 0 refills | Status: AC
  Filled 2024-11-05: qty 60, 30d supply, fill #0

## 2024-11-05 NOTE — ED Triage Notes (Signed)
 Pt states she has upper abdominal pain X 5 days.   She also had bilateral knee pain long time she has had shots in her knees before  She is taking tylenol  as needed.

## 2024-11-05 NOTE — Discharge Instructions (Addendum)
 Take the omeprazole  twice daily before meals to see if this helps with the epigastric pain.  Consider eating smaller meals and staying upright afterwards to see if this helps as well.  For your chronic knee pain I suggest following up with orthopedic, they can consider steroid injections.  Seek immediate care for any worsening chest pain, or new concerning symptoms.

## 2024-11-05 NOTE — ED Provider Notes (Signed)
 [facility]    CSN: [id number] Arrival date & time: 11/05/24  1518      History   Chief Complaint Chief Complaint  Patient presents with   Abdominal Pain   Knee Pain    HPI Allison Franco is a 83 y.o. female.   Patient presents to clinic over concern of epigastric pain that is worse at night for the past 5 nights.  Pain will occur intermittently throughout the day.  Has not had belching, burning of the esophagus, bloating, nausea, vomiting or diarrhea.  Did have a normal bowel movement this morning.  Denies chest pain, cough, fever, recent illness, wheezing or shortness of breath.  When the pain occurs she will drink water and this seems to help.  The cold weather has exacerbated her tinnitus.  Also having exacerbation of bilateral chronic knee pain.  Has not had any trauma or falls recently.  Oral diclofenac  was helping with this.  Renal function WNL on labs drawn 3 months ago.   The history is provided by the patient and medical records.  Abdominal Pain Knee Pain   Past Medical History:  Diagnosis Date   Anxiety    Constipation    Depression    following husband's death   Diabetes mellitus without complication (HCC)    Hypertension    KNEE PAIN, BILATERAL 02/13/2009   Qualifier: Diagnosis of  By: Gladis FNP, Delorise     Tinnitus     Patient Active Problem List   Diagnosis Date Noted   Class 1 obesity due to excess calories with serious comorbidity and body mass index (BMI) of 32.0 to 32.9 in adult 08/28/2024   Peripheral edema 08/28/2024   Encounter for annual health examination 03/11/2024   Abnormal EKG 03/11/2024   Type 2 diabetes mellitus with hyperlipidemia (HCC) 10/24/2023   Need for influenza vaccination 10/24/2023   COVID-19 vaccine administered 10/24/2023   Seasonal allergies 07/29/2023   Dysfunction of Eustachian tube, bilateral 07/29/2023   Obesity (BMI 30.0-34.9) 06/20/2023   Primary open angle glaucoma of both eyes, severe stage  06/20/2023   Facial rash 06/20/2023   Aortic atherosclerosis 02/23/2023   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/23/2023   Constipation 02/14/2023   Unilateral primary osteoarthritis, left knee 05/18/2022   Inguinal hernia with bowel obstruction 08/07/2019   Incarcerated left inguinal hernia 08/07/2019   Sensorineural hearing loss (SNHL) of right ear with restricted hearing of left ear 05/21/2019   Tinnitus of both ears 05/21/2019   Anemia 08/31/2018   Type 2 diabetes mellitus without complication, without long-term current use of insulin  (HCC) 07/02/2018   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      Home Medications    Prior to Admission medications   Medication Sig Start Date End Date Taking? Authorizing Provider  acetaminophen  (TYLENOL ) 500 MG tablet Take 1 tablet (500 mg total) by mouth every 4 (four) hours as needed. 02/21/23  Yes Rising, Asberry, PA-C  albuterol  (VENTOLIN  HFA) 108 (90 Base) MCG/ACT inhaler Inhale 1-2 puffs into the lungs every 6 (six) hours as needed for wheezing or shortness of breath. 10/17/23  Yes Myrna Camelia HERO, NP  Alcohol Swabs (ALCOHOL WIPES) 70 % PADS by Does not apply route. Use with blood sugar check and injection of insulin    Yes [provider]  Cholecalciferol  (VITAMIN D3) 25 MCG (1000 UT) CAPS Take 1 capsule (1,000 Units total) by mouth daily. 06/02/21  Yes Georgina Speaks, FNP  diclofenac  (VOLTAREN ) 50 MG EC tablet Take 1 tablet (50 mg total)  by mouth 2 (two) times daily. 10/14/24  Yes Dreama, Diondre Pulis  N, FNP  dorzolamide -timolol  (COSOPT ) 2-0.5 % ophthalmic solution Instill 1 drop into both eyes twice a day 01/25/23  Yes   dorzolamide -timolol  (COSOPT ) 2-0.5 % ophthalmic solution Instill 1 drop into both eyes twice a day 05/05/23  Yes   Doxepin  HCl 3 MG TABS Take 1 tablet (3 mg total) by mouth at bedtime as needed (for insomnia). 09/23/24  Yes Andra Krabbe C, PA-C  glucose blood (ACCU-CHEK GUIDE) test strip TEST 3 TIMES A DAY 08/29/22  Yes Georgina Speaks, FNP  metFORMIN  (GLUCOPHAGE ) 500 MG tablet Take 1 tablet (500 mg total) by mouth daily with breakfast. 08/01/24  Yes Georgina Speaks, FNP  Multiple Vitamins-Minerals (CENTRUM SILVER 50+WOMEN) TABS Take 1 tablet by mouth daily.   Yes [provider]  olmesartan  (BENICAR ) 20 MG tablet Take 1 tablet (20 mg total) by mouth in the morning and at bedtime. 08/16/24  Yes Georgina Speaks, FNP  omeprazole  (PRILOSEC) 20 MG capsule Take 1 capsule (20 mg total) by mouth 2 (two) times daily before a meal. 11/05/24  Yes Monta Maiorana  N, FNP  dorzolamide -timolol  (COSOPT ) 2-0.5 % ophthalmic solution Place 1 drop into both eyes 2 (two) times daily. 07/01/24     furosemide  (LASIX ) 20 MG tablet Take 1 tablet (20 mg total) by mouth every morning for 7 days. 06/17/24 08/16/24  Iola Lukes, FNP  potassium chloride  (KLOR-CON ) 10 MEQ tablet Take 1 tablet (10 mEq total) by mouth daily for 7 days. 06/17/24 08/16/24  Iola Lukes, FNP    Family History Family History  Problem Relation Age of Onset   Diabetes Mother    Hypertension Father    Breast cancer Neg Hx     Social History Social History   Tobacco Use   Smoking status: Never   Smokeless tobacco: Never  Vaping Use   Vaping status: Never Used  Substance Use Topics   Alcohol use: No   Drug use: No     Allergies   Aspirin , Hydroxyzine , and Valium  [diazepam ]   Review of Systems Review of Systems  Per HPI  Physical  Exam Triage Vital Signs ED Triage Vitals  Encounter Vitals Group     BP 11/05/24 1726 (!) 181/95     Girls Systolic BP Percentile --      Girls Diastolic BP Percentile --      Boys Systolic BP Percentile --      Boys Diastolic BP Percentile --      Pulse Rate 11/05/24 1726 83     Resp 11/05/24 1726 16     Temp 11/05/24 1726 97.9 F (36.6 C)     Temp Source 11/05/24 1726 Oral     SpO2 11/05/24 1726 97 %     Weight --      Height --      Head Circumference --      Peak Flow --  Pain Score 11/05/24 1725 8     Pain Loc --      Pain Education --      Exclude from Growth Chart --    No data found.  Updated Vital Signs BP (!) 181/95 (BP Location: Right Arm)   Pulse 83   Temp 97.9 F (36.6 C) (Oral)   Resp 16   SpO2 97%   Visual Acuity Right Eye Distance:   Left Eye Distance:   Bilateral Distance:    Right Eye Near:   Left Eye Near:    Bilateral Near:     Physical Exam Vitals and nursing note reviewed.  Constitutional:      Appearance: Normal appearance. She is well-developed.  HENT:     Head: Normocephalic and atraumatic.     Right Ear: External ear normal.     Left Ear: External ear normal.     Nose: Nose normal.     Mouth/Throat:     Mouth: Mucous membranes are moist.  Eyes:     Conjunctiva/sclera: Conjunctivae normal.  Cardiovascular:     Rate and Rhythm: Normal rate.     Heart sounds: Normal heart sounds. No murmur heard. Pulmonary:     Effort: Pulmonary effort is normal. No respiratory distress.     Breath sounds: Normal breath sounds. No wheezing.  Abdominal:     General: Abdomen is flat. Bowel sounds are normal. There is no distension.     Palpations: Abdomen is soft.     Tenderness: There is no abdominal tenderness. There is no guarding.  Musculoskeletal:        General: Normal range of motion.  Skin:    General: Skin is warm and dry.  Neurological:     General: No focal deficit present.     Mental Status: She is alert and oriented to  person, place, and time.  Psychiatric:        Mood and Affect: Mood normal.        Behavior: Behavior normal.      UC Treatments / Results  Labs (all labs ordered are listed, but only abnormal results are displayed) Labs Reviewed - No data to display  EKG   Radiology No results found.  Procedures Procedures (including critical care time)  Medications Ordered in UC Medications - No data to display  Initial Impression / Assessment and Plan / UC Course  I have reviewed the triage vital signs and the nursing notes.  Pertinent labs & imaging results that were available during my care of the patient were reviewed by me and considered in my medical decision making (see chart for details).  Vitals and triage reviewed, patient is hemodynamically stable.  Lungs vesicular, heart with regular rate and rhythm.  EKG obtained, shows rate of 68 bpm normal sinus rhythm with first-degree AV block, comparable to previous.  Without ST elevation or ST depression.  Abdomen is soft and nontender with active bowel sounds.  Does have mild epigastric tenderness.  Without rebound or guarding, low clinical concern for acute abdomen.  Chronic bilateral knee pain, no new trauma or falls.  Encouraged orthopedic follow-up where they can consider steroid injections.  Patient agreeable to plan.  Symptoms could be related to acid reflux, will trial omeprazole .  Strict emergency precautions given if symptoms evolve or worsen.     Final Clinical Impressions(s) / UC Diagnoses   Final diagnoses:  Chronic pain of both knees  Abdominal pain, epigastric     Discharge Instructions  Take the omeprazole  twice daily before meals to see if this helps with the epigastric pain.  Consider eating smaller meals and staying upright afterwards to see if this helps as well.  For your chronic knee pain I suggest following up with orthopedic, they can consider steroid injections.  Seek immediate care for any  worsening chest pain, or new concerning symptoms.     ED Prescriptions     Medication Sig Dispense Auth. Provider   omeprazole  (PRILOSEC) 20 MG capsule Take 1 capsule (20 mg total) by mouth 2 (two) times daily before a meal. 60 capsule Dreama, Adyn Hoes  N, FNP      PDMP not reviewed this encounter.   Dreama, Kavya Haag  N, FNP 11/05/24 1858

## 2024-11-06 ENCOUNTER — Other Ambulatory Visit (HOSPITAL_COMMUNITY): Payer: Self-pay

## 2024-11-07 ENCOUNTER — Emergency Department (HOSPITAL_COMMUNITY)

## 2024-11-07 ENCOUNTER — Other Ambulatory Visit: Payer: Self-pay

## 2024-11-07 ENCOUNTER — Encounter (HOSPITAL_COMMUNITY): Payer: Self-pay

## 2024-11-07 ENCOUNTER — Emergency Department (HOSPITAL_COMMUNITY)
Admission: EM | Admit: 2024-11-07 | Discharge: 2024-11-07 | Disposition: A | Discharge status: Home / Self Care | Attending: Emergency Medicine | Admitting: Emergency Medicine

## 2024-11-07 ENCOUNTER — Other Ambulatory Visit (HOSPITAL_COMMUNITY): Payer: Self-pay

## 2024-11-07 DIAGNOSIS — Z79899 Other long term (current) drug therapy: Secondary | ICD-10-CM | POA: Diagnosis not present

## 2024-11-07 DIAGNOSIS — R519 Headache, unspecified: Secondary | ICD-10-CM | POA: Insufficient documentation

## 2024-11-07 DIAGNOSIS — I10 Essential (primary) hypertension: Secondary | ICD-10-CM | POA: Insufficient documentation

## 2024-11-07 DIAGNOSIS — E119 Type 2 diabetes mellitus without complications: Secondary | ICD-10-CM | POA: Diagnosis not present

## 2024-11-07 DIAGNOSIS — E871 Hypo-osmolality and hyponatremia: Secondary | ICD-10-CM | POA: Insufficient documentation

## 2024-11-07 DIAGNOSIS — Z7984 Long term (current) use of oral hypoglycemic drugs: Secondary | ICD-10-CM | POA: Insufficient documentation

## 2024-11-07 DIAGNOSIS — R079 Chest pain, unspecified: Secondary | ICD-10-CM | POA: Diagnosis present

## 2024-11-07 LAB — CBC WITH DIFFERENTIAL/PLATELET
Abs Immature Granulocytes: 0.03 K/uL (ref 0.00–0.07)
Basophils Absolute: 0 K/uL (ref 0.0–0.1)
Basophils Relative: 1 %
Eosinophils Absolute: 0.3 K/uL (ref 0.0–0.5)
Eosinophils Relative: 4 %
HCT: 38.6 % (ref 36.0–46.0)
Hemoglobin: 12.3 g/dL (ref 12.0–15.0)
Immature Granulocytes: 1 %
Lymphocytes Relative: 28 %
Lymphs Abs: 1.8 K/uL (ref 0.7–4.0)
MCH: 29.2 pg (ref 26.0–34.0)
MCHC: 31.9 g/dL (ref 30.0–36.0)
MCV: 91.7 fL (ref 80.0–100.0)
Monocytes Absolute: 0.5 K/uL (ref 0.1–1.0)
Monocytes Relative: 8 %
Neutro Abs: 3.7 K/uL (ref 1.7–7.7)
Neutrophils Relative %: 58 %
Platelets: 261 K/uL (ref 150–400)
RBC: 4.21 MIL/uL (ref 3.87–5.11)
RDW: 11.9 % (ref 11.5–15.5)
WBC: 6.3 K/uL (ref 4.0–10.5)
nRBC: 0 % (ref 0.0–0.2)

## 2024-11-07 LAB — COMPREHENSIVE METABOLIC PANEL WITH GFR
ALT: 22 U/L (ref 0–44)
AST: 26 U/L (ref 15–41)
Albumin: 3.4 g/dL — ABNORMAL LOW (ref 3.5–5.0)
Alkaline Phosphatase: 90 U/L (ref 38–126)
Anion gap: 6 (ref 5–15)
BUN: 19 mg/dL (ref 8–23)
CO2: 27 mmol/L (ref 22–32)
Calcium: 8.5 mg/dL — ABNORMAL LOW (ref 8.9–10.3)
Chloride: 98 mmol/L (ref 98–111)
Creatinine, Ser: 0.74 mg/dL (ref 0.44–1.00)
GFR, Estimated: >60 mL/min (ref >60)
Glucose, Bld: 106 mg/dL — ABNORMAL HIGH (ref 70–99)
Potassium: 4.2 mmol/L (ref 3.5–5.1)
Sodium: 131 mmol/L — ABNORMAL LOW (ref 135–145)
Total Bilirubin: 0.8 mg/dL (ref 0.0–1.2)
Total Protein: 6.7 g/dL (ref 6.5–8.1)

## 2024-11-07 LAB — TROPONIN I (HIGH SENSITIVITY): Troponin I (High Sensitivity): 16 ng/L (ref <18)

## 2024-11-07 MED ORDER — ACCU-CHEK GUIDE W/DEVICE KIT
1.0000 | PACK | Freq: Once | 0 refills | Status: AC
  Filled 2024-11-07: qty 1, 30d supply, fill #0

## 2024-11-07 MED ORDER — IRBESARTAN 150 MG PO TABS
150.0000 mg | ORAL_TABLET | Freq: Every day | ORAL | Status: DC
  Administered 2024-11-07: 150 mg via ORAL
  Filled 2024-11-07: qty 1

## 2024-11-07 MED ORDER — ACCU-CHEK SOFTCLIX LANCETS MISC
1.0000 | Freq: Three times a day (TID) | 12 refills | Status: AC
  Filled 2024-11-07: qty 100, 33d supply, fill #0

## 2024-11-07 MED FILL — Glucose Blood Test Strip: 1.0000 | 34 days supply | Qty: 100 | Fill #0 | Status: AC

## 2024-11-07 NOTE — Care Management (Addendum)
 PCP information on AVS. Patient having social isolation, resource guide to social services attached to AVS

## 2024-11-07 NOTE — ED Provider Triage Note (Signed)
 Emergency Medicine Provider Triage Evaluation Note  Allison Franco , a 83 y.o. female  was evaluated in triage.  Pt complains of hypertension with a headache.  She denies chest pain, urinary symptoms, shortness of breath.  She reports being compliant with medications  Review of Systems  Positive:  Negative:   Physical Exam  BP (!) 156/84 (BP Location: Left Arm)   Pulse 82   Temp 98 F (36.7 C) (Oral)   Resp 16   Ht 5' 3 (1.6 m)   Wt 81.6 kg   SpO2 99%   BMI 31.89 kg/m  Gen:   Awake, no distress   Resp:  Normal effort  MSK:   Moves extremities without difficulty  Other:    Medical Decision Making  Medically screening exam initiated at 4:30 AM.  Appropriate orders placed.  Allison Franco was informed that the remainder of the evaluation will be completed by another provider, this initial triage assessment does not replace that evaluation, and the importance of remaining in the ED until their evaluation is complete.     Allison Ubaldo NOVAK, PA-C 11/07/24 0430

## 2024-11-07 NOTE — ED Provider Notes (Signed)
°  Physical Exam  BP (!) 149/78   Pulse 81   Temp 98.4 F (36.9 C) (Oral)   Resp 17   Ht 5' 3 (1.6 m)   Wt 81.6 kg   SpO2 99%   BMI 31.89 kg/m   Physical Exam  Procedures  Procedures  ED Course / MDM    Medical Decision Making Amount and/or Complexity of Data Reviewed Labs: ordered. Radiology: ordered.  Risk OTC drugs.   Patient with high blood pressure.  Needs a PCP.  Mild hyponatremia.  EKG reassuring.  Troponin negative.  Social work is help arrange follow-up.  Will discharge.  Discussed with patient and her son       Allison Lot, MD 11/07/24 [place]

## 2024-11-07 NOTE — ED Triage Notes (Signed)
 Patient BIB EMS due to HTN. Patient states she woke up and felt like her BP was high, when checking it at home BP was 200/104 so she called EMS. Patient has hx of HTN and takes medications regularly as prescribed. Does endorse a minor headache. EMS VSS A&Ox4.

## 2024-11-07 NOTE — ED Notes (Signed)
 Call lab to check on Troponin results, per lab they are unable to find the tube of blood.

## 2024-11-07 NOTE — ED Notes (Signed)
Phlebotomy requested for labs  

## 2024-11-07 NOTE — ED Provider Notes (Signed)
 [place] [facility] AT [facility] Provider Note   CSN: [id number] Arrival date & time: 11/07/24  0345     Patient presents with: Hypertension   Allison Franco is a 83 y.o. female.   Patient is an 83 year old female with a history of hypertension, diabetes who is presenting today with complaints that her blood pressure is too elevated.  Patient reports that she woke up in the middle of the night with a headache which caused her to worry and she called 911.  Patient has not had a functional blood pressure cuff at home although she has been compliant with her medications.  She also reports that her blood sugar monitor is also broken.  She tries to eat a low salt and sugar diet.  She has been seen recently because she was having some epigastric discomfort that occasionally will go up into her chest and she was given a prescription for omeprazole  but has not started it yet as she just picked it up yesterday.  She states that with exertion she has been short of breath but it has been ongoing for 2 years and has not significantly changed.  She has not had any recent new cough or congestion.  She denies any abdominal pain and states that she is eating and drinking well.  She has had no recent change in her medications but states recently she was informed that she needed to get a new doctor which she is having a hard time doing.  The history is provided by the patient and medical records.  Hypertension       Prior to Admission medications  Medication Sig Start Date End Date Taking? Authorizing Provider  acetaminophen  (TYLENOL ) 500 MG tablet Take 1 tablet (500 mg total) by mouth every 4 (four) hours as needed. 02/21/23   Rising, Asberry, PA-C  albuterol  (VENTOLIN  HFA) 108 (90 Base) MCG/ACT inhaler Inhale 1-2 puffs into the lungs every 6 (six) hours as needed for wheezing or shortness of breath. 10/17/23   Myrna Camelia HERO, NP  Alcohol Swabs (ALCOHOL WIPES) 70 % PADS by Does not  apply route. Use with blood sugar check and injection of insulin     [provider]  Cholecalciferol  (VITAMIN D3) 25 MCG (1000 UT) CAPS Take 1 capsule (1,000 Units total) by mouth daily. 06/02/21   Georgina Speaks, FNP  diclofenac  (VOLTAREN ) 50 MG EC tablet Take 1 tablet (50 mg total) by mouth 2 (two) times daily. 10/14/24   Dreama, [place]  N, FNP  dorzolamide -timolol  (COSOPT ) 2-0.5 % ophthalmic solution Instill 1 drop into both eyes twice a day 01/25/23     dorzolamide -timolol  (COSOPT ) 2-0.5 % ophthalmic solution Instill 1 drop into both eyes twice a day 05/05/23     dorzolamide -timolol  (COSOPT ) 2-0.5 % ophthalmic solution Place 1 drop into both eyes 2 (two) times daily. 07/01/24     Doxepin  HCl 3 MG TABS Take 1 tablet (3 mg total) by mouth at bedtime as needed (for insomnia). 09/23/24   Andra Corean BROCKS, PA-C  furosemide  (LASIX ) 20 MG tablet Take 1 tablet (20 mg total) by mouth every morning for 7 days. 06/17/24 08/16/24  Iola Lukes, FNP  glucose blood (ACCU-CHEK GUIDE) test strip TEST 3 TIMES A DAY 08/29/22   Georgina Speaks, FNP  metFORMIN  (GLUCOPHAGE ) 500 MG tablet Take 1 tablet (500 mg total) by mouth daily with breakfast. 08/01/24   Georgina Speaks, FNP  Multiple Vitamins-Minerals (CENTRUM SILVER 50+WOMEN) TABS Take 1 tablet by mouth daily.    [provider]  olmesartan  (BENICAR ) 20 MG tablet Take 1 tablet (20 mg total) by mouth in the morning and at bedtime. 08/16/24   Georgina Speaks, FNP  omeprazole  (PRILOSEC) 20 MG capsule Take 1 capsule (20 mg total) by mouth 2 (two) times daily before a meal. 11/05/24   Dreama, [place]  N, FNP  potassium chloride  (KLOR-CON ) 10 MEQ tablet Take 1 tablet (10 mEq total) by mouth daily for 7 days. 06/17/24 08/16/24  Iola Lukes, FNP    Allergies: Aspirin , Hydroxyzine , and Valium  [diazepam ]    Review of Systems  Updated Vital Signs BP (!) 144/73   Pulse 82   Temp 97.9 F (36.6 C)   Resp 16   Ht 5' 3 (1.6 m)   Wt 81.6 kg   SpO2 99%    BMI 31.89 kg/m   Physical Exam Vitals and nursing note reviewed.  Constitutional:      General: She is not in acute distress.    Appearance: She is well-developed.  HENT:     Head: Normocephalic and atraumatic.  Eyes:     Pupils: Pupils are equal, round, and reactive to light.  Cardiovascular:     Rate and Rhythm: Normal rate and regular rhythm.     Pulses: Normal pulses.     Heart sounds: Normal heart sounds. No murmur heard.    No friction rub.  Pulmonary:     Effort: Pulmonary effort is normal.     Breath sounds: Normal breath sounds. No wheezing or rales.  Abdominal:     General: Bowel sounds are normal. There is no distension.     Palpations: Abdomen is soft.     Tenderness: There is no abdominal tenderness. There is no guarding or rebound.  Musculoskeletal:        General: No tenderness. Normal range of motion.     Right lower leg: No edema.     Left lower leg: No edema.     Comments: No edema  Skin:    General: Skin is warm and dry.     Findings: No rash.  Neurological:     Mental Status: She is alert and oriented to person, place, and time. Mental status is at baseline.     Cranial Nerves: No cranial nerve deficit.     Sensory: No sensory deficit.     Motor: No weakness.     Gait: Gait normal.  Psychiatric:        Mood and Affect: Mood normal.        Behavior: Behavior normal.     (all labs ordered are listed, but only abnormal results are displayed) Labs Reviewed  COMPREHENSIVE METABOLIC PANEL WITH GFR - Abnormal; Notable for the following components:      Result Value   Sodium 131 (*)    Glucose, Bld 106 (*)    Calcium 8.5 (*)    Albumin 3.4 (*)    All other components within normal limits  CBC WITH DIFFERENTIAL/PLATELET  TROPONIN I (HIGH SENSITIVITY)    EKG: None  Radiology: CT Head Wo Contrast Result Date: 11/07/2024 EXAM: CT HEAD WITHOUT 11/07/2024 04:03:27 AM TECHNIQUE: CT of the head was performed without the administration of  intravenous contrast. Automated exposure control, iterative reconstruction, and/or weight based adjustment of the mA/kV was utilized to reduce the radiation dose to as low as reasonably achievable. COMPARISON: Brain MRI 11/03/2008. Temporal Bone CT 02/05/2024. CLINICAL HISTORY: 83 year old female with headache and hypertension (systolic 200 mmHg). FINDINGS: BRAIN AND VENTRICLES: No acute intracranial  hemorrhage. No mass effect or midline shift. No extra-axial fluid collection. No evidence of acute infarct. No hydrocephalus. Brain volume is normal for age. Patchy chronic cerebral white matter hypodensity appears similar to the 2019 MRI. Interval increased heterogeneity in the left deep gray nuclei. Maintained gray white differentiation otherwise. Calcified atherosclerosis at the skull base. No suspicious intracranial vascular hyperdensity. ORBITS: No acute abnormality. SINUSES AND MASTOIDS: Paranasal sinuses, tympanic cavities and mastoids remain well aerated. SOFT TISSUES AND SKULL: No acute skull fracture. No acute soft tissue abnormality. IMPRESSION: 1. No acute intracranial abnormality. 2. 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 Electronically signed by: Helayne Hurst MD 11/07/2024 04:13 AM EST RP Workstation: [id number]     Procedures   Medications Ordered in the ED - No data to display                                  Medical Decision Making Amount and/or Complexity of Data Reviewed Labs: ordered. Radiology: ordered.   Pt with multiple medical problems and comorbidities and presenting today with a complaint that caries a high risk for morbidity and mortality.  Here today with the above complaint.  Initially patient was noted to be hypertensive however she has been compliant with her medications on repeat check your blood pressure is 144/73 and continues to improve.  She does report occasional chest discomfort but does not appear to be  exertional and has no known heart history.  She does have exertional shortness of breath which has been present for 2 years and has not significantly changed.  Patient did recently receive a prescription for omeprazole  but has not started it yet.  I independently interpreted patient's labs and EKG.  EKG shows no acute changes from prior, CBC within normal limits, CMP with minimal hyponatremia 131 but otherwise normal.  Patient had a head CT done when she was being screened however is not displaying any signs concerning for stroke at this time and low suspicion for an intracranial bleed. I have independently visualized and interpreted pt's images today.  Head CT without evidence of acute findings today.  Radiology does report chronic small vessel disease with progression in the left deep gray nuclei.       Final diagnoses:  None    ED Discharge Orders     None          Doretha Folks, MD 11/07/24 1559

## 2024-11-13 ENCOUNTER — Other Ambulatory Visit (HOSPITAL_COMMUNITY): Payer: Self-pay

## 2024-11-15 ENCOUNTER — Ambulatory Visit: Payer: Self-pay

## 2024-11-15 NOTE — Telephone Encounter (Signed)
 FYI Only or Action Required?: FYI only for provider: ED advised.  Patient was last seen in primary care on 08/16/2024 by Petrina Pries, NP.  Called Nurse Triage reporting Chest Pain.  Symptoms began several weeks ago.  Interventions attempted: Prescription medications: omeprazole .  Symptoms are: gradually worsening.  Triage Disposition: Go to ED Now (Notify PCP)  Patient/caregiver understands and will follow disposition?: Yes    Copied from CRM #[id number]. Topic: Clinical - Red Word Triage >> Nov 15, 2024  9:23 AM Suzen RAMAN wrote: Red Word that prompted transfer to Nurse Triage: Unable to sleep due stomach and chest pain, requesting an appt with pcp; per recent UC visit Reason for Disposition  [1] Chest pain (or angina) comes and goes AND [2] is happening more often (increasing in frequency) or getting worse (increasing in severity)  (Exception: Chest pains that last only a few seconds.)  Answer Assessment - Initial Assessment Questions Pt has been to ED and UC multiple times for epigastric pain/chest pain. She states the pain wakes her up in the night. She states pain is burning in the center of her gets. They started her on omeprazole  and she states since then her pain is worse. She states she gets short of breath with walking. She states for the last 2 weeks she has also had tingling randomly in her body, mostly her legs and hands but also her back and stomach. Denies any weakness or numbness on one side of her body. Given that her chest pain is worse since her last evaluation in the Er (12.11.25), RN recommendations are for patient to go to the  ER. She states she has someone to take her and declines 911. Given ED dispo, not all triage questions answered. Rn did advise her to call back after discharged to schedule follow up.   1. LOCATION: Where does it hurt?       Center of chest 2. RADIATION: Does the pain go anywhere else? (e.g., into neck, jaw, arms, back)      3. ONSET:  When did the chest pain begin? (Minutes, hours or days)      On going but has gotten worse over the last week.  4. PATTERN: Does the pain come and go, or has it been constant since it started?  Does it get worse with exertion?      intermittent 5. DURATION: How long does it last (e.g., seconds, minutes, hours)     She states can last up to an hour 6. SEVERITY: How bad is the pain?  (e.g., Scale 1-10; mild, moderate, or severe)      7. CARDIAC RISK FACTORS: Do you have any history of heart problems or risk factors for heart disease? (e.g., angina, prior heart attack; diabetes, high blood pressure, high cholesterol, smoker, or strong family history of heart disease)     htn 8. PULMONARY RISK FACTORS: Do you have any history of lung disease?  (e.g., blood clots in lung, asthma, emphysema, birth control pills)      9. CAUSE: What do you think is causing the chest pain?      10. OTHER SYMPTOMS: Do you have any other symptoms? (e.g., dizziness, nausea, vomiting, sweating, fever, difficulty breathing, cough)       Tingling and shortness of breath with exertion  Protocols used: Chest Pain-A-AH

## 2024-11-20 ENCOUNTER — Ambulatory Visit: Admitting: Cardiovascular Disease

## 2024-11-22 ENCOUNTER — Ambulatory Visit: Attending: Cardiology | Admitting: Cardiology

## 2024-11-22 ENCOUNTER — Other Ambulatory Visit (HOSPITAL_COMMUNITY): Payer: Self-pay

## 2024-11-22 ENCOUNTER — Encounter: Payer: Self-pay | Admitting: Cardiology

## 2024-11-22 VITALS — BP 142/70 | HR 76 | Ht 63.0 in

## 2024-11-22 DIAGNOSIS — R0609 Other forms of dyspnea: Secondary | ICD-10-CM | POA: Diagnosis not present

## 2024-11-22 DIAGNOSIS — I10 Essential (primary) hypertension: Secondary | ICD-10-CM

## 2024-11-22 DIAGNOSIS — E782 Mixed hyperlipidemia: Secondary | ICD-10-CM | POA: Insufficient documentation

## 2024-11-22 DIAGNOSIS — I517 Cardiomegaly: Secondary | ICD-10-CM | POA: Diagnosis not present

## 2024-11-22 DIAGNOSIS — R600 Localized edema: Secondary | ICD-10-CM

## 2024-11-22 DIAGNOSIS — Z79899 Other long term (current) drug therapy: Secondary | ICD-10-CM | POA: Diagnosis not present

## 2024-11-22 DIAGNOSIS — R9431 Abnormal electrocardiogram [ECG] [EKG]: Secondary | ICD-10-CM | POA: Diagnosis not present

## 2024-11-22 LAB — BASIC METABOLIC PANEL WITH GFR
BUN/Creatinine Ratio: 20 (ref 12–28)
BUN: 16 mg/dL (ref 8–27)
CO2: 24 mmol/L (ref 20–29)
Calcium: 9.3 mg/dL (ref 8.7–10.3)
Chloride: 96 mmol/L (ref 96–106)
Creatinine, Ser: 0.79 mg/dL (ref 0.57–1.00)
Glucose: 71 mg/dL (ref 70–99)
Potassium: 4.5 mmol/L (ref 3.5–5.2)
Sodium: 133 mmol/L — ABNORMAL LOW (ref 134–144)
eGFR: 74 mL/min/1.73

## 2024-11-22 LAB — PRO B NATRIURETIC PEPTIDE: NT-Pro BNP: 51 pg/mL (ref 0–738)

## 2024-11-22 MED ORDER — FUROSEMIDE 40 MG PO TABS
40.0000 mg | ORAL_TABLET | Freq: Every day | ORAL | 1 refills | Status: AC
  Filled 2024-11-22: qty 90, 90d supply, fill #0

## 2024-11-22 MED ORDER — ROSUVASTATIN CALCIUM 20 MG PO TABS
20.0000 mg | ORAL_TABLET | Freq: Every day | ORAL | 1 refills | Status: AC
  Filled 2024-11-22: qty 90, 90d supply, fill #0

## 2024-11-22 NOTE — Progress Notes (Signed)
 " Cardiology Office Note:  .   Date:  11/22/2024  ID:  Allison Franco, DOB 1941/03/14, MRN [id number] PCP: Georgina Speaks, FNP   HeartCare Providers Cardiologist:  Newman Lawrence, MD PCP: Georgina Speaks, FNP  Chief Complaint  Patient presents with   Abnormal ECG     Allison Franco is a 83 y.o. female with hypertension, hyperlipidemia, type 2 diabetes mellitus, abnormal EKG, leg swelling, dyspnea on exertion  Discussed the use of AI scribe software for clinical note transcription with the patient, who gave verbal consent to proceed.  History of Present Illness Allison Franco is an 83 year old female with diabetes who presents with shortness of breath and leg swelling. She was referred by her primary doctor due to an abnormal EKG.  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 She denies typical chest pain but notes a brief daily sensation of pinching inside lasting a few seconds.  She has had bilateral leg swelling for about a year despite monitoring salt intake.  Knee pain and leg tingling limit her mobility.  She has diabetes treated with medication. She is not taking cholesterol medication despite elevated cholesterol documented in April of last year.      Vitals:   11/22/24 0943  BP: (!) 142/70  Pulse: 76  SpO2: 99%      Review of Systems  Cardiovascular:  Positive for dyspnea on exertion and leg swelling. Negative for chest pain, palpitations and syncope.        Studies Reviewed: SABRA       EKG 11/22/2024: Normal sinus rhythm Possible Left atrial enlargement Low voltage QRS When compared with ECG of 07-Nov-2024 14:04, No significant change was found  Labs 4-10/2024: Chol 183, TG 132, HDL 46, LDL 113 HbA1C 5.9% Hb 12.3 Cr 0.74, Na 131, glucose 106   Physical Exam Vitals and nursing note reviewed.  Constitutional:      General: She is not in acute distress. Neck:      Vascular: No JVD.  Cardiovascular:     Rate and Rhythm: Normal rate and regular rhythm.     Heart sounds: Normal heart sounds. No murmur heard. Pulmonary:     Effort: Pulmonary effort is normal.     Breath sounds: Normal breath sounds. No wheezing or rales.  Musculoskeletal:     Right lower leg: Edema (1+) present.     Left lower leg: Edema (1+) present.      VISIT DIAGNOSES:   ICD-10-CM   1. Primary hypertension  I10 EKG 12-Lead    Basic Metabolic Panel (BMET)    2. Abnormal EKG  R94.31 ECHOCARDIOGRAM COMPLETE    3. Left atrial enlargement  I51.7 ECHOCARDIOGRAM COMPLETE    4. Mixed hyperlipidemia  E78.2     5. Dyspnea on exertion  R06.09 furosemide  (LASIX ) 40 MG tablet    ECHOCARDIOGRAM COMPLETE    Basic Metabolic Panel (BMET)    Pro b natriuretic peptide (BNP)    6. Leg edema  R60.0 furosemide  (LASIX ) 40 MG tablet    ECHOCARDIOGRAM COMPLETE    Basic Metabolic Panel (BMET)    Pro b natriuretic peptide (BNP)    7. Medication management  Z79.899 Basic Metabolic Panel (BMET)    Pro b natriuretic peptide (BNP)       Allison Franco is a 83 y.o. female with hypertension, hyperlipidemia, type 2 diabetes mellitus, abnormal EKG, leg swelling, dyspnea on exertion Assessment & Plan Leg  edema, dyspnea on exertion: Concerning for heart failure. Check echocardiogram, start Lasix  80 mg daily and check BMP and proBNP in 1 week.   Discussed low-salt diet.    Abnormal EKG: Left atrial enlargement noted.  Will check echocardiogram.  Primary hypertension: Hypertension management is crucial due to heart failure symptoms. Generally well-controlled, no changes made today.  Mixed hyperlipidemia: LDL 113.  Given her diabetes, recommend Crestor  20 mg daily.  Type 2 diabetes mellitus: Diabetes management is ongoing with current medications. - Continue current diabetes medications.     Meds ordered this encounter  Medications   rosuvastatin  (CRESTOR ) 20 MG tablet    Sig: Take 1  tablet (20 mg total) by mouth daily.    Dispense:  90 tablet    Refill:  1   furosemide  (LASIX ) 40 MG tablet    Sig: Take 1 tablet (40 mg total) by mouth daily.    Dispense:  90 tablet    Refill:  1     F/u in 8-12 weeks  Signed, Newman JINNY Lawrence, MD  "

## 2024-11-22 NOTE — Patient Instructions (Signed)
 Medication Instructions:  Please START crestor  20 mg daily. Most people take this medication at bedtime.  Please START lasix  40 mg daily.   *If you need a refill on your cardiac medications before your next appointment, please call your pharmacy*  Lab Work: Please complete a BMET and a pro-BNP in our first floor lab before you leave today.   If you have labs (blood work) drawn today and your tests are completely normal, you will receive your results only by: MyChart Message (if you have MyChart) OR A paper copy in the mail If you have any lab test that is abnormal or we need to change your treatment, we will call you to review the results.  Testing/Procedures: Your physician has requested that you have an echocardiogram. Echocardiography is a painless test that uses sound waves to create images of your heart. It provides your doctor with information about the size and shape of your heart and how well your hearts chambers and valves are working. This procedure takes approximately one hour. There are no restrictions for this procedure. Please do NOT wear cologne, perfume, aftershave, or lotions (deodorant is allowed). Please arrive 15 minutes prior to your appointment time.  Please note: We ask at that you not bring children with you during ultrasound (echo/ vascular) testing. Due to room size and safety concerns, children are not allowed in the ultrasound rooms during exams. Our front office staff cannot provide observation of children in our lobby area while testing is being conducted. An adult accompanying a patient to their appointment will only be allowed in the ultrasound room at the discretion of the ultrasound technician under special circumstances. We apologize for any inconvenience.   Follow-Up: At [facility], you and your health needs are our priority.  As part of our continuing mission to provide you with exceptional heart care, our providers are all part of one team.   This team includes your primary Cardiologist (physician) and Advanced Practice Providers or APPs (Physician Assistants and Nurse Practitioners) who all work together to provide you with the care you need, when you need it.  Your next appointment:   4-6 week(s)  Provider:   Josefa Beauvais, NP, Orren Fabry, PA-C, Jon Hails, PA-C, Dayna Dunn, PA-C, Madison Fountain, NP, Callie Goodrich, PA-C, Sheng Haley, PA-C, Michele Lenze, PA-C, Hao Meng, PA-C, Damien Braver, NP, or Glendia Ferrier, PA-C        We recommend signing up for the patient portal called MyChart.  Sign up information is provided on this After Visit Summary.  MyChart is used to connect with patients for Virtual Visits (Telemedicine).  Patients are able to view lab/test results, encounter notes, upcoming appointments, etc.  Non-urgent messages can be sent to your provider as well.   To learn more about what you can do with MyChart, go to [email or web address].

## 2024-11-23 ENCOUNTER — Ambulatory Visit: Payer: Self-pay | Admitting: Cardiology

## 2024-12-24 ENCOUNTER — Other Ambulatory Visit (HOSPITAL_COMMUNITY): Payer: Self-pay

## 2024-12-24 ENCOUNTER — Other Ambulatory Visit: Payer: Self-pay | Admitting: Nurse Practitioner

## 2024-12-24 ENCOUNTER — Telehealth: Payer: Self-pay

## 2024-12-24 DIAGNOSIS — I119 Hypertensive heart disease without heart failure: Secondary | ICD-10-CM

## 2024-12-24 MED ORDER — OLMESARTAN MEDOXOMIL 20 MG PO TABS
20.0000 mg | ORAL_TABLET | Freq: Two times a day (BID) | ORAL | 1 refills | Status: DC
  Filled 2024-12-24: qty 90, 45d supply, fill #0

## 2024-12-24 NOTE — Telephone Encounter (Signed)
 Copied from CRM #[id number]. Topic: Clinical - Medication Question >> Dec 24, 2024 11:44 AM Wess RAMAN wrote: Reason for CRM: Erin from [facility] is requesting a 100 day supply for medications  Medications: olmesartan  (BENICAR ) 20 MG tablet  metFORMIN  (GLUCOPHAGE ) 500 MG tablet  Pharmacy: [facility] [address], [address] [place] [address] Phone: [phone] Fax: [phone] Hours: M-F 7:30am-7:00p

## 2024-12-25 ENCOUNTER — Other Ambulatory Visit: Payer: Self-pay

## 2024-12-25 ENCOUNTER — Other Ambulatory Visit (HOSPITAL_COMMUNITY): Payer: Self-pay

## 2024-12-25 DIAGNOSIS — I119 Hypertensive heart disease without heart failure: Secondary | ICD-10-CM

## 2024-12-25 DIAGNOSIS — E1169 Type 2 diabetes mellitus with other specified complication: Secondary | ICD-10-CM

## 2024-12-25 MED ORDER — METFORMIN HCL 500 MG PO TABS
500.0000 mg | ORAL_TABLET | Freq: Every day | ORAL | 1 refills | Status: AC
  Filled 2024-12-25: qty 100, 100d supply, fill #0

## 2024-12-25 MED ORDER — DORZOLAMIDE HCL-TIMOLOL MAL 2-0.5 % OP SOLN
1.0000 [drp] | Freq: Two times a day (BID) | OPHTHALMIC | 6 refills | Status: AC
  Filled 2024-12-25: qty 10, 50d supply, fill #0

## 2024-12-25 MED ORDER — OLMESARTAN MEDOXOMIL 20 MG PO TABS
20.0000 mg | ORAL_TABLET | Freq: Two times a day (BID) | ORAL | 1 refills | Status: AC
  Filled 2024-12-25: qty 100, 50d supply, fill #0

## 2025-01-01 ENCOUNTER — Ambulatory Visit (HOSPITAL_COMMUNITY): Admission: RE | Admit: 2025-01-01 | Discharge: 2025-01-01 | Attending: Cardiology

## 2025-01-01 DIAGNOSIS — R9431 Abnormal electrocardiogram [ECG] [EKG]: Secondary | ICD-10-CM

## 2025-01-01 DIAGNOSIS — I10 Essential (primary) hypertension: Secondary | ICD-10-CM | POA: Diagnosis not present

## 2025-01-01 DIAGNOSIS — R0609 Other forms of dyspnea: Secondary | ICD-10-CM

## 2025-01-01 DIAGNOSIS — I517 Cardiomegaly: Secondary | ICD-10-CM

## 2025-01-01 DIAGNOSIS — R600 Localized edema: Secondary | ICD-10-CM

## 2025-01-01 DIAGNOSIS — R0600 Dyspnea, unspecified: Secondary | ICD-10-CM | POA: Diagnosis not present

## 2025-01-01 LAB — ECHOCARDIOGRAM COMPLETE
AR max vel: 2.69 cm2
AV Area VTI: 2.57 cm2
AV Area mean vel: 2.7 cm2
AV Mean grad: 6 mmHg
AV Peak grad: 15.1 mmHg
Ao pk vel: 1.94 m/s
Area-P 1/2: 2.55 cm2
P 1/2 time: 535 ms
S' Lateral: 2.4 cm

## 2025-01-01 NOTE — Progress Notes (Signed)
 Normal LV function.  Mildly elevated RV pressure.  Continue Lasix .  Seeing you on 01/06/2025.  Thanks MJP

## 2025-01-06 ENCOUNTER — Ambulatory Visit: Admitting: Emergency Medicine

## 2025-01-29 ENCOUNTER — Ambulatory Visit: Payer: 59

## 2025-03-05 ENCOUNTER — Encounter: Payer: Self-pay | Admitting: Nurse Practitioner
